# Patient Record
Sex: Female | Born: 1980 | Race: Black or African American | Hispanic: No | Smoking: Former smoker
Health system: Southern US, Community
[De-identification: ages and names within clinical notes are randomized; demographics above are authoritative.]

## PROBLEM LIST (undated history)

## (undated) ENCOUNTER — Inpatient Hospital Stay (HOSPITAL_COMMUNITY): Payer: Self-pay

## (undated) DIAGNOSIS — N390 Urinary tract infection, site not specified: Secondary | ICD-10-CM

## (undated) DIAGNOSIS — I10 Essential (primary) hypertension: Secondary | ICD-10-CM

## (undated) DIAGNOSIS — R87619 Unspecified abnormal cytological findings in specimens from cervix uteri: Secondary | ICD-10-CM

## (undated) DIAGNOSIS — K769 Liver disease, unspecified: Secondary | ICD-10-CM

## (undated) DIAGNOSIS — G4730 Sleep apnea, unspecified: Secondary | ICD-10-CM

## (undated) DIAGNOSIS — R0600 Dyspnea, unspecified: Secondary | ICD-10-CM

## (undated) DIAGNOSIS — O24419 Gestational diabetes mellitus in pregnancy, unspecified control: Secondary | ICD-10-CM

## (undated) DIAGNOSIS — D219 Benign neoplasm of connective and other soft tissue, unspecified: Secondary | ICD-10-CM

## (undated) DIAGNOSIS — IMO0002 Reserved for concepts with insufficient information to code with codable children: Secondary | ICD-10-CM

## (undated) HISTORY — PX: TUBAL LIGATION: SHX77

## (undated) HISTORY — PX: DILATION AND CURETTAGE OF UTERUS: SHX78

## (undated) HISTORY — DX: Liver disease, unspecified: K76.9

---

## 2005-07-10 ENCOUNTER — Other Ambulatory Visit: Admission: RE | Admit: 2005-07-10 | Discharge: 2005-07-10 | Payer: Self-pay | Admitting: Obstetrics and Gynecology

## 2005-08-21 ENCOUNTER — Inpatient Hospital Stay (HOSPITAL_COMMUNITY): Admission: AD | Admit: 2005-08-21 | Discharge: 2005-08-21 | Payer: Self-pay | Admitting: Obstetrics and Gynecology

## 2006-09-30 ENCOUNTER — Inpatient Hospital Stay (HOSPITAL_COMMUNITY): Admission: AD | Admit: 2006-09-30 | Discharge: 2006-09-30 | Payer: Self-pay | Admitting: Obstetrics and Gynecology

## 2006-10-02 ENCOUNTER — Encounter (INDEPENDENT_AMBULATORY_CARE_PROVIDER_SITE_OTHER): Payer: Self-pay | Admitting: Specialist

## 2006-10-02 ENCOUNTER — Ambulatory Visit (HOSPITAL_COMMUNITY): Admission: RE | Admit: 2006-10-02 | Discharge: 2006-10-02 | Payer: Self-pay | Admitting: Obstetrics & Gynecology

## 2007-05-20 ENCOUNTER — Inpatient Hospital Stay (HOSPITAL_COMMUNITY): Admission: AD | Admit: 2007-05-20 | Discharge: 2007-05-21 | Payer: Self-pay | Admitting: Obstetrics & Gynecology

## 2008-11-08 ENCOUNTER — Emergency Department (HOSPITAL_COMMUNITY): Admission: EM | Admit: 2008-11-08 | Discharge: 2008-11-08 | Payer: Self-pay | Admitting: Emergency Medicine

## 2008-12-14 ENCOUNTER — Encounter: Admission: RE | Admit: 2008-12-14 | Discharge: 2008-12-14 | Payer: Self-pay | Admitting: Orthopedic Surgery

## 2009-02-03 ENCOUNTER — Emergency Department (HOSPITAL_COMMUNITY): Admission: EM | Admit: 2009-02-03 | Discharge: 2009-02-03 | Payer: Self-pay | Admitting: Family Medicine

## 2010-04-30 ENCOUNTER — Emergency Department (HOSPITAL_COMMUNITY): Admission: EM | Admit: 2010-04-30 | Discharge: 2010-04-30 | Payer: Self-pay | Admitting: Family Medicine

## 2010-08-18 ENCOUNTER — Inpatient Hospital Stay (HOSPITAL_COMMUNITY)
Admission: AD | Admit: 2010-08-18 | Discharge: 2010-08-18 | Payer: Self-pay | Source: Home / Self Care | Attending: Obstetrics & Gynecology | Admitting: Obstetrics & Gynecology

## 2010-08-19 LAB — URINALYSIS, ROUTINE W REFLEX MICROSCOPIC
Bilirubin Urine: NEGATIVE
Ketones, ur: NEGATIVE mg/dL
Leukocytes, UA: NEGATIVE
Nitrite: NEGATIVE
Protein, ur: NEGATIVE mg/dL
Specific Gravity, Urine: >1.03 — ABNORMAL HIGH (ref 1.005–1.030)
Urine Glucose, Fasting: NEGATIVE mg/dL
Urobilinogen, UA: 0.2 mg/dL (ref 0.0–1.0)
pH: 6 (ref 5.0–8.0)

## 2010-08-19 LAB — POCT PREGNANCY, URINE: Preg Test, Ur: POSITIVE

## 2010-08-19 LAB — URINE MICROSCOPIC-ADD ON

## 2010-08-26 ENCOUNTER — Encounter: Payer: Self-pay | Admitting: Orthopedic Surgery

## 2010-08-27 ENCOUNTER — Inpatient Hospital Stay (HOSPITAL_COMMUNITY)
Admission: AD | Admit: 2010-08-27 | Discharge: 2010-08-27 | Payer: Self-pay | Source: Home / Self Care | Attending: Obstetrics and Gynecology | Admitting: Obstetrics and Gynecology

## 2010-08-28 LAB — WET PREP, GENITAL
Clue Cells Wet Prep HPF POC: NONE SEEN
Trich, Wet Prep: NONE SEEN
Yeast Wet Prep HPF POC: NONE SEEN

## 2010-08-28 LAB — HCG, QUANTITATIVE, PREGNANCY: hCG, Beta Chain, Quant, S: 16211 m[IU]/mL — ABNORMAL HIGH (ref <5)

## 2010-08-28 LAB — GC/CHLAMYDIA PROBE AMP, GENITAL
Chlamydia, DNA Probe: NEGATIVE
GC Probe Amp, Genital: NEGATIVE

## 2010-10-04 ENCOUNTER — Encounter: Payer: Managed Care, Other (non HMO) | Attending: Obstetrics and Gynecology | Admitting: Dietician

## 2010-10-04 DIAGNOSIS — R7309 Other abnormal glucose: Secondary | ICD-10-CM | POA: Insufficient documentation

## 2010-10-04 DIAGNOSIS — Z713 Dietary counseling and surveillance: Secondary | ICD-10-CM | POA: Insufficient documentation

## 2010-10-04 DIAGNOSIS — E669 Obesity, unspecified: Secondary | ICD-10-CM | POA: Insufficient documentation

## 2010-10-17 LAB — POCT RAPID STREP A (OFFICE): Streptococcus, Group A Screen (Direct): POSITIVE — AB

## 2010-10-25 ENCOUNTER — Other Ambulatory Visit (HOSPITAL_COMMUNITY): Payer: Self-pay | Admitting: Obstetrics and Gynecology

## 2010-10-25 DIAGNOSIS — Z3689 Encounter for other specified antenatal screening: Secondary | ICD-10-CM

## 2010-11-10 LAB — POCT RAPID STREP A (OFFICE): Streptococcus, Group A Screen (Direct): NEGATIVE

## 2010-11-13 ENCOUNTER — Inpatient Hospital Stay (HOSPITAL_COMMUNITY): Payer: Managed Care, Other (non HMO)

## 2010-11-13 ENCOUNTER — Encounter (HOSPITAL_COMMUNITY): Payer: Self-pay | Admitting: Radiology

## 2010-11-13 ENCOUNTER — Inpatient Hospital Stay (HOSPITAL_COMMUNITY)
Admission: AD | Admit: 2010-11-13 | Discharge: 2010-11-21 | DRG: 781 | Disposition: A | Discharge status: Home Health | Payer: Managed Care, Other (non HMO) | Source: Ambulatory Visit | Attending: Obstetrics & Gynecology | Admitting: Obstetrics & Gynecology

## 2010-11-13 DIAGNOSIS — O3410 Maternal care for benign tumor of corpus uteri, unspecified trimester: Secondary | ICD-10-CM | POA: Diagnosis present

## 2010-11-13 DIAGNOSIS — Z0489 Encounter for examination and observation for other specified reasons: Secondary | ICD-10-CM

## 2010-11-13 DIAGNOSIS — G4730 Sleep apnea, unspecified: Secondary | ICD-10-CM | POA: Diagnosis present

## 2010-11-13 DIAGNOSIS — O24919 Unspecified diabetes mellitus in pregnancy, unspecified trimester: Principal | ICD-10-CM | POA: Diagnosis present

## 2010-11-13 DIAGNOSIS — O99891 Other specified diseases and conditions complicating pregnancy: Secondary | ICD-10-CM | POA: Diagnosis present

## 2010-11-13 DIAGNOSIS — O99210 Obesity complicating pregnancy, unspecified trimester: Secondary | ICD-10-CM | POA: Diagnosis present

## 2010-11-13 DIAGNOSIS — E669 Obesity, unspecified: Secondary | ICD-10-CM | POA: Diagnosis present

## 2010-11-13 DIAGNOSIS — E119 Type 2 diabetes mellitus without complications: Secondary | ICD-10-CM | POA: Diagnosis present

## 2010-11-13 DIAGNOSIS — IMO0002 Reserved for concepts with insufficient information to code with codable children: Secondary | ICD-10-CM

## 2010-11-13 DIAGNOSIS — E1165 Type 2 diabetes mellitus with hyperglycemia: Secondary | ICD-10-CM

## 2010-11-13 DIAGNOSIS — J45909 Unspecified asthma, uncomplicated: Secondary | ICD-10-CM | POA: Diagnosis present

## 2010-11-13 DIAGNOSIS — O169 Unspecified maternal hypertension, unspecified trimester: Secondary | ICD-10-CM

## 2010-11-13 DIAGNOSIS — D259 Leiomyoma of uterus, unspecified: Secondary | ICD-10-CM | POA: Diagnosis present

## 2010-11-13 LAB — BASIC METABOLIC PANEL
BUN: 4 mg/dL — ABNORMAL LOW (ref 6–23)
CO2: 24 mEq/L (ref 19–32)
Calcium: 9.3 mg/dL (ref 8.4–10.5)
Chloride: 102 mEq/L (ref 96–112)
Creatinine, Ser: 0.64 mg/dL (ref 0.4–1.2)
GFR calc Af Amer: >60 mL/min (ref >60)
GFR calc non Af Amer: >60 mL/min (ref >60)
Glucose, Bld: 191 mg/dL — ABNORMAL HIGH (ref 70–99)
Potassium: 3.7 mEq/L (ref 3.5–5.1)
Sodium: 133 mEq/L — ABNORMAL LOW (ref 135–145)

## 2010-11-13 LAB — CBC
HCT: 35.7 % — ABNORMAL LOW (ref 36.0–46.0)
Hemoglobin: 11.3 g/dL — ABNORMAL LOW (ref 12.0–15.0)
MCH: 27.2 pg (ref 26.0–34.0)
MCHC: 31.7 g/dL (ref 30.0–36.0)
MCV: 85.8 fL (ref 78.0–100.0)
Platelets: 245 10*3/uL (ref 150–400)
RBC: 4.16 MIL/uL (ref 3.87–5.11)
RDW: 14.5 % (ref 11.5–15.5)
WBC: 9.9 10*3/uL (ref 4.0–10.5)

## 2010-11-13 LAB — GLUCOSE, CAPILLARY
Glucose-Capillary: 182 mg/dL — ABNORMAL HIGH (ref 70–99)
Glucose-Capillary: 166 mg/dL — ABNORMAL HIGH (ref 70–99)

## 2010-11-14 LAB — GLUCOSE, CAPILLARY
Glucose-Capillary: 141 mg/dL — ABNORMAL HIGH (ref 70–99)
Glucose-Capillary: 147 mg/dL — ABNORMAL HIGH (ref 70–99)
Glucose-Capillary: 155 mg/dL — ABNORMAL HIGH (ref 70–99)
Glucose-Capillary: 217 mg/dL — ABNORMAL HIGH (ref 70–99)

## 2010-11-15 ENCOUNTER — Inpatient Hospital Stay (HOSPITAL_COMMUNITY): Payer: Managed Care, Other (non HMO)

## 2010-11-15 DIAGNOSIS — J45909 Unspecified asthma, uncomplicated: Secondary | ICD-10-CM

## 2010-11-15 DIAGNOSIS — Z331 Pregnant state, incidental: Secondary | ICD-10-CM

## 2010-11-15 LAB — GLUCOSE, CAPILLARY

## 2010-11-16 DIAGNOSIS — R0602 Shortness of breath: Secondary | ICD-10-CM

## 2010-11-16 LAB — GLUCOSE, CAPILLARY
Glucose-Capillary: 151 mg/dL — ABNORMAL HIGH (ref 70–99)
Glucose-Capillary: 170 mg/dL — ABNORMAL HIGH (ref 70–99)
Glucose-Capillary: 128 mg/dL — ABNORMAL HIGH (ref 70–99)
Glucose-Capillary: 119 mg/dL — ABNORMAL HIGH (ref 70–99)

## 2010-11-17 LAB — GLUCOSE, CAPILLARY
Glucose-Capillary: 150 mg/dL — ABNORMAL HIGH (ref 70–99)
Glucose-Capillary: 152 mg/dL — ABNORMAL HIGH (ref 70–99)
Glucose-Capillary: 184 mg/dL — ABNORMAL HIGH (ref 70–99)
Glucose-Capillary: 158 mg/dL — ABNORMAL HIGH (ref 70–99)
Glucose-Capillary: 154 mg/dL — ABNORMAL HIGH (ref 70–99)

## 2010-11-18 ENCOUNTER — Encounter (HOSPITAL_COMMUNITY): Payer: Managed Care, Other (non HMO)

## 2010-11-18 DIAGNOSIS — R0602 Shortness of breath: Secondary | ICD-10-CM

## 2010-11-18 LAB — DIFFERENTIAL
Basophils Absolute: 0 10*3/uL (ref 0.0–0.1)
Basophils Relative: 0 % (ref 0–1)
Eosinophils Absolute: 0.2 10*3/uL (ref 0.0–0.7)
Monocytes Absolute: 0.6 10*3/uL (ref 0.1–1.0)
Monocytes Relative: 5 % (ref 3–12)
Neutro Abs: 8.3 10*3/uL — ABNORMAL HIGH (ref 1.7–7.7)
Neutrophils Relative %: 79 % — ABNORMAL HIGH (ref 43–77)

## 2010-11-18 LAB — TSH: TSH: 1.267 u[IU]/mL (ref 0.350–4.500)

## 2010-11-18 LAB — CBC
Hemoglobin: 11.2 g/dL — ABNORMAL LOW (ref 12.0–15.0)
MCH: 27.3 pg (ref 26.0–34.0)
MCHC: 31.6 g/dL (ref 30.0–36.0)
Platelets: 220 10*3/uL (ref 150–400)
RBC: 4.11 MIL/uL (ref 3.87–5.11)

## 2010-11-18 LAB — IGE: IgE (Immunoglobulin E), Serum: 75.7 IU/mL (ref 0.0–180.0)

## 2010-11-18 LAB — GLUCOSE, CAPILLARY
Glucose-Capillary: 146 mg/dL — ABNORMAL HIGH (ref 70–99)
Glucose-Capillary: 168 mg/dL — ABNORMAL HIGH (ref 70–99)

## 2010-11-19 LAB — GLUCOSE, CAPILLARY
Glucose-Capillary: 141 mg/dL — ABNORMAL HIGH (ref 70–99)
Glucose-Capillary: 133 mg/dL — ABNORMAL HIGH (ref 70–99)
Glucose-Capillary: 121 mg/dL — ABNORMAL HIGH (ref 70–99)
Glucose-Capillary: 143 mg/dL — ABNORMAL HIGH (ref 70–99)

## 2010-11-19 LAB — T3, FREE: T3, Free: 3 pg/mL (ref 2.3–4.2)

## 2010-11-20 ENCOUNTER — Ambulatory Visit (HOSPITAL_COMMUNITY): Admission: RE | Admit: 2010-11-20 | Payer: Managed Care, Other (non HMO) | Source: Ambulatory Visit

## 2010-11-20 ENCOUNTER — Inpatient Hospital Stay (HOSPITAL_COMMUNITY): Payer: Managed Care, Other (non HMO)

## 2010-11-20 DIAGNOSIS — O9989 Other specified diseases and conditions complicating pregnancy, childbirth and the puerperium: Secondary | ICD-10-CM

## 2010-11-20 DIAGNOSIS — O99891 Other specified diseases and conditions complicating pregnancy: Secondary | ICD-10-CM

## 2010-11-20 DIAGNOSIS — J45909 Unspecified asthma, uncomplicated: Secondary | ICD-10-CM

## 2010-11-20 DIAGNOSIS — E119 Type 2 diabetes mellitus without complications: Secondary | ICD-10-CM

## 2010-11-20 DIAGNOSIS — O24919 Unspecified diabetes mellitus in pregnancy, unspecified trimester: Secondary | ICD-10-CM

## 2010-11-20 LAB — GLUCOSE, CAPILLARY
Glucose-Capillary: 144 mg/dL — ABNORMAL HIGH (ref 70–99)
Glucose-Capillary: 151 mg/dL — ABNORMAL HIGH (ref 70–99)

## 2010-11-20 MED ORDER — IOHEXOL 300 MG/ML  SOLN: 100.0000 mL | Freq: Once | INTRAMUSCULAR | Status: DC | PRN

## 2010-11-20 MED ORDER — IOHEXOL 350 MG/ML SOLN: 100.0000 mL | Freq: Once | INTRAVENOUS | Status: AC | PRN

## 2010-11-21 LAB — COMPREHENSIVE METABOLIC PANEL
ALT: 25 U/L (ref 0–35)
AST: 24 U/L (ref 0–37)
Alkaline Phosphatase: 33 U/L — ABNORMAL LOW (ref 39–117)
CO2: 24 mEq/L (ref 19–32)
Chloride: 102 mEq/L (ref 96–112)
GFR calc Af Amer: >60 mL/min (ref >60)
GFR calc non Af Amer: >60 mL/min (ref >60)
Glucose, Bld: 126 mg/dL — ABNORMAL HIGH (ref 70–99)
Sodium: 136 mEq/L (ref 135–145)
Total Bilirubin: 0.4 mg/dL (ref 0.3–1.2)

## 2010-11-21 LAB — CBC
HCT: 35.4 % — ABNORMAL LOW (ref 36.0–46.0)
Hemoglobin: 11.1 g/dL — ABNORMAL LOW (ref 12.0–15.0)
MCHC: 31.4 g/dL (ref 30.0–36.0)
RBC: 4.11 MIL/uL (ref 3.87–5.11)

## 2010-11-21 LAB — HEMOGLOBIN A1C: Hgb A1c MFr Bld: 7.7 % — ABNORMAL HIGH (ref <5.7)

## 2010-11-21 LAB — GLUCOSE, CAPILLARY: Glucose-Capillary: 126 mg/dL — ABNORMAL HIGH (ref 70–99)

## 2010-11-22 LAB — CREATININE, URINE, 24 HOUR
Collection Interval-UCRE24: 24 hours
Creatinine, Urine: 125.9 mg/dL
Urine Total Volume-UCRE24: 2300 mL

## 2010-11-22 LAB — PROTEIN, URINE, 24 HOUR
Collection Interval-UPROT: 24 hours
Protein, 24H Urine: 276 mg/d — ABNORMAL HIGH (ref 50–100)
Protein, Urine: 12 mg/dL

## 2010-11-25 NOTE — Discharge Summary (Signed)
[name], [name]               ACCOUNT NO.:  [id number]  MEDICAL RECORD NO.:  [id number]           PATIENT TYPE:  I  LOCATION:  9309                          FACILITY:  [facility]  PHYSICIAN:  Horton Chin, MD DATE OF BIRTH:  [birth year]  DATE OF ADMISSION:  11/13/2010 DATE OF DISCHARGE:  11/21/2010                              DISCHARGE SUMMARY   ADMISSION DIAGNOSES: 1. Intrauterine pregnancy at 18 weeks. 2. Morbid obesity with a BMI of 67. 3. Poorly-controlled type 2 diabetes. 4. Shortness of breath of unknown etiology.  DISCHARGE DIAGNOSES: 1. Intrauterine pregnancy at 8 and 1/7th weeks gestation. 2. Improved control of type 2 diabetes. 3. Likely obstructive sleep apnea. 4. Reactive airway disease.  CONSULTATIONS: 1. Particia Nearing, MD, Maternal Fetal Medicine. 2. Charlcie Cradle. Delford Field, MD, FCCP, [facility]. 3. Diabetic education by Lenor Coffin, RN, CDE, diabetics coordinator. 4. Nutritionist consultation. 5. Care management consultation to help with home health followup.  PERTINENT STUDIES: 1. Obstetric ultrasound on November 15, 2010 showed an estimated fetal     weight of 239 grams in the 50th percentile, normal amniotic fluid,     normal basic anatomy, cervical length 3.8 cm, and left fundal myoma     measuring 7.7 x 5.7 x 5.4 cm. 2. Echocardiogram with an estimated ejection fraction of 60-65%, mild     left ventricular hypertrophy, grade 1 diastolic dysfunction, mildly     dilated left atrium. 3. Bilateral lower extremity Dopplers which were negative for DVT. 4. Chest x-ray:  No acute cardiopulmonary disease. 5. CT scan of the chest which did not show any large kind of embolism,     but the small peripheral branches could not be fully assessed     secondary to the suboptimal images due to the patient's habitus. 6. The patient did have an hemoglobin A1c of 7.7.  She had a     TSH of 1.614.  She had normal comprehensive metabolic  panel and a     normal complete blood count.  At the time of discharge, the results     of her 24-hour urine were pending.  The patient did have a D-dimer     that was 0.34.  HOSPITAL COURSE:  The patient is a 30 year old gravida 2, para 0-0-1-0 who was admitted at 18 weeks with shortness of breath and poorly controlled type 2 diabetes.  The patient was initially managed by the [facility].  Upon review of the chart, she was noted to have shortness of breath that was concerning and was evaluated by Pulmonary Team that felt that this was likely an obstructive sleep apnea with reactive airway disease.  Also, there was concern about possible pulmonary embolus but a CT scan that was done did not show evidence of pulmonary embolus but this was a suboptimal study given her habitus.  The patient was started on medications for her reactive airway disease and also the plan was for her to follow up outpatient for a sleep study for her shortness of breath.  Her shortness of breath did persist throughout  her stay and she required oxygen, especially during any form of exertion.  Plans were made for home health nurse and home health care and allow for oxygen to be used at home for the patient.  The patient had no evidence of any acute cardiopulmonary compromise as she did have a normal echocardiogram and other studies. Of note, these studies were also noted to be suboptimal secondary to the patient's habitus. Of note, the patient was started on prophylactic  Lovenox for DVT and PE prophylaxis while she was in-house.  As for her type 2 diabetes, the patient was seen by Lenor Coffin, clinical diabetic educator and also Dr. Particia Nearing from Maternal and Fetal Care Medicine.  The patient was started on weight-based insulin regimen and sliding scale was also added for better correction of her CBGs.  At the time of discharge, the patient's regimen was NPH 40 in the morning, 55 at bed  time, and also regular 45 units before each meal.  She also had a correction scale for all her blood sugars.  The patient was also seen by nutritionist and given instructions on how to follow a high carbohydrate modified diet for improved glycemic control.  As part of workup for pregestational diabetes, a 24-hour urine collection was started and the results were pending at the time of this dictation.  She did have a TSH which was done which was within normal limits and a hemoglobin A1c that was 7.7.  The patient has been educated about the risks of uncontrolled diabetes, and increased maternal-fetal morbidity and mortality that could result as a result from poorly controlled diabetes.  By the time of discharge, the patient's glycemic control was improved,  her shortness of breath was stable, and she was deemed stable for  discharge to home with home health care.  DISCHARGE MEDICATIONS:  The patient does have written instructions for the home health care and for her home oxygen.  1. Lovenox 100 mg subcu q.24 h. 2.  NPH insulin 40 units in the morning and 55 in the p.m. and also regular 45 units before each meal within a regular insulin correction scale. 1. Symbicort 160/4.5 mcg two puffs inhaled b.i.d. 2. Combivent 2 puffs inhaled q.i.d. p.r.n. wheezing. 3. Metformin 1000 mg p.o. b.i.d. 4. Folic acid 0.4 mg daily. 5. Zyrtec 1 tablet daily. 6. Prenatal vitamins as needed daily.  DISCHARGE INSTRUCTIONS:  The patient has the following discharge appointments.  She will be followed [facility] under that guidance of Maternal and Fetal Medicine Team at [facility] and information was faxed over to them.  They will be calling her with an appointment but she also has an appointment with Dr. Sherrie George here at [facility] on Dec 17, 2010 at 10:00 a.m. for followup.  This appointment could be changed if she does get a clinic appointment at [facility]  before then, because she might be able to do these studies there at [facility].  She also had an appointment with Dr. Stann Mainland at [facility] for evaluation for a sleep study and this is on Dec 10, 2010 at 2:30 p.m.  The patient will be seen by [facility], the number is [phone].  The patient was given routine antenatal discharge instructions.  She was also told to follow up in the emergency room for any concerns and to follow up with all her appointments and to expect a call from the [facility] at [facility] regarding when she  will come in for a clinic appointment.     Horton Chin, MD     UAA/MEDQ  D:  11/21/2010  T:  11/22/2010  Job:  [id number]  Electronically Signed by Jaynie Collins MD on 11/25/2010 12:28:49 PM

## 2010-11-26 ENCOUNTER — Inpatient Hospital Stay (HOSPITAL_COMMUNITY)
Admission: AD | Admit: 2010-11-26 | Discharge: 2010-11-26 | Disposition: A | Discharge status: Home / Self Care | Payer: Managed Care, Other (non HMO) | Source: Ambulatory Visit | Attending: Obstetrics & Gynecology | Admitting: Obstetrics & Gynecology

## 2010-11-26 DIAGNOSIS — O99891 Other specified diseases and conditions complicating pregnancy: Secondary | ICD-10-CM | POA: Insufficient documentation

## 2010-11-26 DIAGNOSIS — N949 Unspecified condition associated with female genital organs and menstrual cycle: Secondary | ICD-10-CM

## 2010-11-26 DIAGNOSIS — O9989 Other specified diseases and conditions complicating pregnancy, childbirth and the puerperium: Secondary | ICD-10-CM

## 2010-11-26 LAB — URINALYSIS, ROUTINE W REFLEX MICROSCOPIC
Bilirubin Urine: NEGATIVE
Glucose, UA: NEGATIVE mg/dL
Protein, ur: NEGATIVE mg/dL

## 2010-11-27 ENCOUNTER — Ambulatory Visit: Payer: Self-pay | Admitting: Dietician

## 2010-12-06 ENCOUNTER — Encounter: Payer: Self-pay | Admitting: Pulmonary Disease

## 2010-12-10 ENCOUNTER — Encounter: Payer: Self-pay | Admitting: Pulmonary Disease

## 2010-12-10 ENCOUNTER — Ambulatory Visit (INDEPENDENT_AMBULATORY_CARE_PROVIDER_SITE_OTHER): Payer: Managed Care, Other (non HMO) | Admitting: Pulmonary Disease

## 2010-12-10 VITALS — BP 130/80 | HR 132 | Temp 98.4°F | Ht 66.0 in | Wt >= 6400 oz

## 2010-12-10 DIAGNOSIS — G4733 Obstructive sleep apnea (adult) (pediatric): Secondary | ICD-10-CM

## 2010-12-10 NOTE — Patient Instructions (Signed)
Will set up for a sleep study, and call you to arrange followup Work on weight loss

## 2010-12-10 NOTE — Progress Notes (Signed)
Subjective:    Patient ID: [name], female    DOB: 02/19/1981, 30 y.o.   MRN: [id number]  HPI The pt is a morbidly obese 29y/o female who is referred for evaluation of possible sleep apnea.  She is [redacted] weeks pregnant, but also has a BMI of 68.  She was recently in the hospital for sob and uncontrolled DM, and had no central PE by ct, no dvt by LE dopplers, and echo showed normal EF with diastolic dysfunction and mild LA dilatation.  She was discharged home on supplemental oxygen at 2lpm, but pt is noncompliant with this at HS.  The question was raised whether she may have osa?  She denies having a snoring issue or abnormal breathing pattern, but has been told she has "heavy breathing".  She feels she is rested in the am's upon arising, but does admit to sleep pressure at work when she is inactive.  She denies dozing in the evening with tv, and has no sleepiness with driving.  Her weight is up 50 pounds from one year ago per her history, and her epworth today is very abnormal at 15.    Sleep Questionnaire:   What time do you typically go to bed?( Between what hours)  9 to 10 pm         How long does it take you to fall asleep?  seconds         How many times during the night do you wake up?  3         What time do you get out of bed to start your day?  0630         Do you drive or operate heavy machinery in your occupation?  No         How much has your weight changed (up or down) over the past two years? (In pounds)           Have you ever had a sleep study before?   No         If yes, location of study?           If yes, date of study?           Do you currently use CPAP?  No         Do you wear oxygen at any time?  Yes         O2 Flow Rate (L/min)  2 L/min              Review of Systems  Constitutional: Positive for unexpected weight change. Negative for fever.  HENT: Positive for dental problem. Negative for ear pain, nosebleeds, congestion, sore throat, rhinorrhea, sneezing,  trouble swallowing, postnasal drip and sinus pressure.   Eyes: Negative for redness and itching.  Respiratory: Positive for shortness of breath. Negative for cough, chest tightness and wheezing.   Cardiovascular: Positive for palpitations. Negative for leg swelling.  Gastrointestinal: Positive for abdominal pain. Negative for nausea, vomiting and abdominal distention.  Genitourinary: Negative for dysuria.  Musculoskeletal: Negative for joint swelling.  Skin: Negative for rash.  Neurological: Positive for headaches.  Hematological: Does not bruise/bleed easily.  Psychiatric/Behavioral: Negative for dysphoric mood. The patient is not nervous/anxious.        Objective:   Physical Exam Constitutional:  Morbidly obese, no acute distress  HENT:  Nares patent without discharge, enlarged turbinates  Oropharynx without exudate, palate and uvula large with soft tissue obstruction posteriorly  Eyes:  Perrla,  eomi, no scleral icterus  Neck:  No JVD, no TMG  Cardiovascular:  Mildly tachy, regular rhythm, no rubs or gallops.  2/6 sem        Intact distal pulses  Pulmonary :  decreased breath sounds, no stridor or respiratory distress   No rales, rhonchi, or wheezing  Abdominal:  Soft, nondistended, bowel sounds present.  No tenderness noted.   Musculoskeletal:  2+ lower extremity edema noted.  Lymph Nodes:  No cervical lymphadenopathy noted  Skin:  No cyanosis noted  Neurologic:  Alert, appropriate, moves all 4 extremities without obvious deficit.         Assessment & Plan:

## 2010-12-15 ENCOUNTER — Encounter: Payer: Self-pay | Admitting: Pulmonary Disease

## 2010-12-15 NOTE — Assessment & Plan Note (Signed)
The pt's history is suggestive of OSA, and it is likely she has OHS as well given her chronic hypoxemia.  I feel very strongly she needs a sleep study, given that we may not be able to keep her and her baby adequately oxygenated at night while sleeping on oxygen alone given her massive obesity.  She is not wanting to have a sleep study, but I have stressed the importance and she has relented.  This is a very high risk situation, and the pt is followed by high risk OB at [facility].

## 2010-12-17 ENCOUNTER — Ambulatory Visit (HOSPITAL_COMMUNITY): Payer: Managed Care, Other (non HMO)

## 2010-12-17 ENCOUNTER — Other Ambulatory Visit (HOSPITAL_COMMUNITY): Payer: Managed Care, Other (non HMO)

## 2010-12-20 ENCOUNTER — Ambulatory Visit (HOSPITAL_COMMUNITY): Payer: Managed Care, Other (non HMO)

## 2010-12-20 NOTE — Op Note (Signed)
[name], [name]               ACCOUNT NO.:  [id number]   MEDICAL RECORD NO.:  [id number]          PATIENT TYPE:  AMB   LOCATION:  SDC                           FACILITY:  [facility]   PHYSICIAN:  Gerrit Friends. Aldona Bar, M.D.   DATE OF BIRTH:  1980-10-04   DATE OF PROCEDURE:  10/02/2006  DATE OF DISCHARGE:                               OPERATIVE REPORT   PREOPERATIVE DIAGNOSES:  1. First trimester pregnancy loss.  2. Blood type A positive.  3. Morbid obesity.   POSTOPERATIVE DIAGNOSES:  1. First trimester pregnancy loss.  2. Blood type A positive.  3. Morbid obesity.  4. Pathology pending.   PROCEDURE:  Suction, dilatation and curettage for evacuation of first  trimester pregnancy loss.   SURGEON:  Gerrit Friends. Aldona Bar, M.D.   ANESTHESIA:  General -- failed MAC.   HISTORY:  This 30 year old gravida 1, para 0 was seen by me in the  office around February 13 and at that time on ultrasound, had a 6-week 5-  day intrauterine pregnancy with good fetal heart.  Other than morbid  obesity, no problems were identified.  She returned to the office for  followup as directed on February 26 and at that time, had an 8-week 2-  day intrauterine pregnancy, but no fetal heart was seen.  Confirmatory  ultrasound was done in the office on a more sensitive machine and  confirmed again fetal loss.  Situation was discussed with the patient,  but the patient elected to defer further treatment for several days.  She self-presented to Maternity Admissions Unit on February 27 for  reevaluation and at that time, a third ultrasound also confirmed first  trimester pregnancy loss.  She is now being taken to the operating room  for termination of a first trimester pregnancy loss.   PROCEDURE:  The patient was taken to the operating room, where after the  satisfactory attempt at intravenous conscious sedation was unsuccessful.  Thereafter, the patient underwent general anesthesia.  She was prepped  and draped, having  been placed in the modified lithotomy position in the  short Allen stirrups.  Her bladder was drained of clear urine with a  latex-free catheter.  At this time, a speculum was placed in the vagina  and a single-tooth tenaculum placed on the anterior lip of cervix and  the internal os dilated progressively to #25 Pratt dilator without  difficulty.  Thereafter, using the #8 suction curette, the cavity was  thoroughly, gently and systematically evacuated of all products of  conception, which were sent subsequently to Pathology.  Curettage of the  cavity found it to be smooth and no remaining products of conception  were noted.  Re-suctioning confirmed an empty uterus.  The patient at  this time, after all instruments were removed, was awakened and  transported to the recovery room in satisfactory, having tolerated the  procedure well.  Estimated blood loss was negligible.  All counts were  correct x2.  Pathologic specimen consisted of products of conception.   The patient will be observed in the recovery area and discharged to  home.  Discharge medications will include Anaprox DS one every 6-8 hours  as needed for cramping and doxycycline 100 mg twice daily for a total of  5 days.  Appropriate instructions will be given to the patient and  followup hopefully will be arranged by the patient in about 2 weeks'  time or as needed.   CONDITION ON ARRIVAL RECOVERY ROOM:  Satisfactory.      Gerrit Friends. Aldona Bar, M.D.  Electronically Signed     RMW/MEDQ  D:  10/02/2006  T:  10/02/2006  Job:  [id number]

## 2011-01-08 ENCOUNTER — Ambulatory Visit (HOSPITAL_BASED_OUTPATIENT_CLINIC_OR_DEPARTMENT_OTHER): Payer: Managed Care, Other (non HMO) | Attending: Pulmonary Disease

## 2011-01-08 DIAGNOSIS — G4733 Obstructive sleep apnea (adult) (pediatric): Secondary | ICD-10-CM

## 2011-01-08 DIAGNOSIS — I491 Atrial premature depolarization: Secondary | ICD-10-CM | POA: Insufficient documentation

## 2011-01-08 DIAGNOSIS — I498 Other specified cardiac arrhythmias: Secondary | ICD-10-CM | POA: Insufficient documentation

## 2011-01-21 DIAGNOSIS — G4733 Obstructive sleep apnea (adult) (pediatric): Secondary | ICD-10-CM

## 2011-01-21 DIAGNOSIS — I498 Other specified cardiac arrhythmias: Secondary | ICD-10-CM

## 2011-01-21 DIAGNOSIS — I491 Atrial premature depolarization: Secondary | ICD-10-CM

## 2011-01-21 NOTE — Procedures (Signed)
[name], [name]               ACCOUNT NO.:  [id number]  MEDICAL RECORD NO.:  [id number]          PATIENT TYPE:  OUT  LOCATION:  SLEEP CENTER                 FACILITY[facility]  [facility]  PHYSICIAN:  Barbaraann Share, MD,FCCPDATE OF BIRTH:  1982/05/31  DATE OF STUDY:  01/08/2011                           NOCTURNAL POLYSOMNOGRAM  REFERRING PHYSICIAN:  Barbaraann Share, MD,FCCP  INDICATION FOR STUDY:  Hypersomnia with sleep apnea.  EPWORTH SCORE:  Nine.  SLEEP ARCHITECTURE:  The patient had a total sleep time of 276 minutes with no slow wave sleep and only 28 minutes of REM.  Sleep onset latency was normal at 6 minutes, and REM onset was normal at 107 minutes.  Sleep efficiency was moderately reduced at 73%.  RESPIRATORY DATA:  The patient was found to have 1 obstructive apnea and 174 obstructive hypopneas, for an apnea/hypopnea index of 38 events per hour.  The events occurred in all body positions, and there was moderate snoring noted throughout.  OXYGEN DATA:  There was O2 desaturation as low as 74% with the patient's obstructive events.  CARDIAC DATA:  The patient was noted to have sinus tachycardia throughout the study with an occasional PAC.  MOVEMENT/PARASOMNIA:  The patient had no significant leg jerks or other abnormal behaviors.  IMPRESSION/RECOMMENDATIONS: 1. Severe obstructive sleep apnea/hypopnea syndrome with an AHI of 38     events per hour and O2 desaturation is low at 74%.  Treatment for     this degree of sleep apnea should include CPAP as well as     aggressive weight loss. 2. The patient was noted to have sinus tachycardia throughout the     night with an occasional PAC.     Barbaraann Share, MD,FCCP Diplomate, American Board of Sleep Medicine Electronically Signed    KMC/MEDQ  D:  01/21/2011 08:49:36  T:  01/21/2011 22:37:08  Job:  [id number]

## 2011-01-24 ENCOUNTER — Ambulatory Visit: Payer: Managed Care, Other (non HMO) | Admitting: Pulmonary Disease

## 2011-02-07 ENCOUNTER — Encounter: Payer: Self-pay | Admitting: Pulmonary Disease

## 2011-02-12 ENCOUNTER — Ambulatory Visit (INDEPENDENT_AMBULATORY_CARE_PROVIDER_SITE_OTHER): Payer: Managed Care, Other (non HMO) | Admitting: Pulmonary Disease

## 2011-02-12 ENCOUNTER — Encounter: Payer: Self-pay | Admitting: Pulmonary Disease

## 2011-02-12 VITALS — BP 126/80 | HR 139 | Temp 98.4°F | Ht 66.0 in | Wt >= 6400 oz

## 2011-02-12 DIAGNOSIS — G4733 Obstructive sleep apnea (adult) (pediatric): Secondary | ICD-10-CM

## 2011-02-12 NOTE — Progress Notes (Signed)
  Subjective:    Patient ID: [name], female    DOB: 09-Apr-1981, 30 y.o.   MRN: [id number]  HPI The pt comes in today for f/u of her recent sleep study.  She was found to have severe osa with AHI 38/hr and desats as low as 74%.  I have reviewed the study with in detail, and answered all of her questions.    Review of Systems  Constitutional: Negative for fever and unexpected weight change.  HENT: Negative for ear pain, nosebleeds, congestion, sore throat, rhinorrhea, sneezing, trouble swallowing, dental problem, postnasal drip and sinus pressure.   Eyes: Negative for redness and itching.  Respiratory: Positive for cough and shortness of breath. Negative for chest tightness and wheezing.   Cardiovascular: Positive for leg swelling. Negative for palpitations.  Gastrointestinal: Positive for nausea and vomiting.  Genitourinary: Negative for dysuria.  Musculoskeletal: Negative for joint swelling.  Skin: Positive for rash.  Neurological: Negative for headaches.  Hematological: Does not bruise/bleed easily.  Psychiatric/Behavioral: Negative for dysphoric mood. The patient is not nervous/anxious.        Objective:   Physical Exam Morbidly obese female, nad Nares without obvious purulence or discharge LE with mild edema, no cyanosis noted Alert, oriented, moves all 4.        Assessment & Plan:

## 2011-02-12 NOTE — Patient Instructions (Signed)
Will start on cpap for your severe sleep apnea.  Please call if having tolerance issues followup with me in 5 weeks to check on your progress.

## 2011-02-13 ENCOUNTER — Telehealth: Payer: Self-pay | Admitting: Pulmonary Disease

## 2011-02-13 NOTE — Telephone Encounter (Signed)
Okey Regal with [facility] called and wanted to check on cpap order. The order didn't state to have have o2 bled into cpap. Please advise if you would like [place] to re-fax the order with directions to bleed o2 into cpap.

## 2011-02-13 NOTE — Telephone Encounter (Signed)
Returned Carol's call and LMOAM for her to return my call.

## 2011-02-13 NOTE — Telephone Encounter (Signed)
Will forward to [facility] for this

## 2011-02-14 NOTE — Telephone Encounter (Signed)
I didn't order her oxygen, and if she is to be on cpap and oxygen it obviously needs to be bled into each other.  Should not need another order for this.  She is on both and should stay on both.

## 2011-02-14 NOTE — Telephone Encounter (Signed)
Called and spoke with Okey Regal and relayed Dr. Shelle Iron message that if patient was on o2 at night at 2 lpm then obviously o2 at 2 lpm needed to be bled into CPAP. This was written on previous order and re-faxed to Nelson at [place].

## 2011-02-17 NOTE — Assessment & Plan Note (Signed)
The pt has severe osa, and will need to be started on cpap.  I have stressed to her how important this is for her and her baby.  She is agreeable.  I will set the patient up on cpap at a moderate pressure level to allow for desensitization, and will troubleshoot the device over the next 4-6weeks if needed.  The pt is to call me if having issues with tolerance.  Will then optimize the pressure once patient is able to wear cpap on a consistent basis.

## 2011-03-19 ENCOUNTER — Ambulatory Visit: Payer: Managed Care, Other (non HMO) | Admitting: Pulmonary Disease

## 2011-04-04 ENCOUNTER — Telehealth: Payer: Self-pay | Admitting: Pulmonary Disease

## 2011-04-04 NOTE — Telephone Encounter (Signed)
Correct # for [name] with [facility] is [phone].  Called and spoke with [name] and informed her patient came to [place] already on oxygen.  We never started her on it.  Jessica vebalized understanding and stated she didn't need an order to d/c o2.  She stated pt was started on o2 while in hospital and pt is going to sign AMA to d/c portable o2 but still keep concentrator.  [name] just wanted this message forwarded to [facility] as an FYI.

## 2011-04-04 NOTE — Telephone Encounter (Signed)
[name], the phone number for Shanda Bumps at [facility] is incorrect.  This is the phone # to [facility] - our patient care coordinator.

## 2011-04-08 NOTE — Telephone Encounter (Signed)
Noted.  That is her decision.  She is followed by high risk OB at [facility], who ordered the oxygen in the first place

## 2011-05-14 LAB — HERPES SIMPLEX VIRUS CULTURE: Culture: NOT DETECTED

## 2011-05-14 LAB — MRSA CULTURE

## 2011-08-24 ENCOUNTER — Encounter (HOSPITAL_COMMUNITY): Payer: Self-pay | Admitting: *Deleted

## 2011-08-24 ENCOUNTER — Emergency Department (INDEPENDENT_AMBULATORY_CARE_PROVIDER_SITE_OTHER)
Admission: EM | Admit: 2011-08-24 | Discharge: 2011-08-24 | Disposition: A | Discharge status: Home / Self Care | Payer: Managed Care, Other (non HMO) | Source: Home / Self Care

## 2011-08-24 DIAGNOSIS — J069 Acute upper respiratory infection, unspecified: Secondary | ICD-10-CM

## 2011-08-24 HISTORY — DX: Morbid (severe) obesity due to excess calories: E66.01

## 2011-08-24 HISTORY — DX: Gestational diabetes mellitus in pregnancy, unspecified control: O24.419

## 2011-08-24 MED ORDER — LIDOCAINE VISCOUS 2 % MT SOLN: 20.0000 mL | OROMUCOSAL | Status: AC | PRN

## 2011-08-24 NOTE — ED Provider Notes (Signed)
History     CSN: [id number]  Arrival date & time 08/24/11  0901   None     Chief Complaint  Patient presents with  . Sore Throat    (Consider location/radiation/quality/duration/timing/severity/associated sxs/prior treatment) HPI Comments: Pt was here at Urgent Care 2 days ago for same sx in infant son, became sick later that day.  Pt also concerned she may be pregnant as period is late.   Patient is a 31 y.o. female presenting with pharyngitis. The history is provided by the patient.  Sore Throat This is a new problem. The current episode started 2 days ago. The problem occurs constantly. The problem has been gradually worsening. Associated symptoms include headaches. Pertinent negatives include no chest pain, no abdominal pain and no shortness of breath. The symptoms are aggravated by swallowing. The symptoms are relieved by nothing. Treatments tried: salt water gargles and sudafed. The treatment provided mild relief.    Past Medical History  Diagnosis Date  . Gestational diabetes   . Morbid obesity     Past Surgical History  Procedure Date  . Dilation and curettage of uterus   . Cesarean section     Family History  Problem Relation Age of Onset  . Allergies Mother   . Asthma Mother     History  Substance Use Topics  . Smoking status: Former Games developer  . Smokeless tobacco: Not on file  . Alcohol Use: No    OB History    Grav Para Term Preterm Abortions TAB SAB Ect Mult Living   1               Review of Systems  Constitutional: Negative for fever and chills.  HENT: Positive for congestion, sore throat, postnasal drip and sinus pressure. Negative for ear pain.   Respiratory: Positive for cough. Negative for shortness of breath and wheezing.   Cardiovascular: Negative for chest pain.  Gastrointestinal: Negative for abdominal pain.  Neurological: Positive for headaches.    Allergies  Other and Penicillins  Home Medications   Current Outpatient Rx  Name  Route Sig Dispense Refill  . IPRATROPIUM-ALBUTEROL 18-103 MCG/ACT IN AERO Inhalation Inhale 2 puffs into the lungs every 6 (six) hours as needed.      . BUDESONIDE-FORMOTEROL FUMARATE 160-4.5 MCG/ACT IN AERO Inhalation Inhale 2 puffs into the lungs 2 (two) times daily.      [name] ENOXAPARIN SODIUM 150 MG/ML [place] SOLN Subcutaneous Inject into the skin daily.      [name] FOLIC ACID 400 MCG PO TABS Oral Take 400 mcg by mouth daily.      [name] NOVOLIN N [place]  as directed.      [name] NOVOLIN R IJ Injection Inject as directed as directed.      [name] METFORMIN HCL 1000 MG PO TABS Oral Take 1,000 mg by mouth 2 (two) times daily with a meal.      . SE-NATAL 19 PO Oral Take 1 tablet by mouth daily.        BP 175/77  Pulse 96  Temp(Src) 98.7 F (37.1 C) (Oral)  Resp 22  SpO2 97%  LMP 07/14/2011  Breastfeeding? Unknown  Physical Exam  Constitutional: She appears well-developed and well-nourished. No distress.       Morbidly obese  HENT:  Head: Normocephalic.  Right Ear: Tympanic membrane, external ear and ear canal normal.  Left Ear: Tympanic membrane, external ear and ear canal normal.  Nose: Mucosal edema present. Right sinus exhibits maxillary sinus tenderness and frontal  sinus tenderness. Left sinus exhibits maxillary sinus tenderness and frontal sinus tenderness.  Mouth/Throat: Uvula swelling present. Posterior oropharyngeal edema present. No tonsillar abscesses.       No erythema to throat, mild swelling of tonsils and uvula  Cardiovascular: Normal rate and regular rhythm.   Pulmonary/Chest: Effort normal and breath sounds normal.  Lymphadenopathy:       Head (right side): Submandibular adenopathy present.       Head (left side): Submandibular adenopathy present.    She has no cervical adenopathy.    ED Course  Procedures (including critical care time)  Pt to f/u with pcp re: elevated bp (pt states was 140/90 earlier this week when she gave blood).   Labs Reviewed  POCT RAPID STREP A ([facility] ONLY)    POCT PREGNANCY, URINE  POCT PREGNANCY, URINE   No results found.   No diagnosis found.    MDM          Cathlyn Parsons, NP 08/24/11 1000

## 2011-08-24 NOTE — ED Notes (Signed)
Last night started w/ swollen tonsils and sore throat without fevers.  Started w/ nasal congestion yesterday.  Has taken Mucinex, a decongestant, and doing warm salt water gargles.

## 2011-08-26 NOTE — ED Provider Notes (Signed)
Medical screening examination/treatment/procedure(s) were performed by non-physician practitioner and as supervising physician I was immediately available for consultation/collaboration.   Barkley Bruns MD.    Barkley Bruns, MD 08/26/11 [phone]

## 2011-09-19 ENCOUNTER — Encounter (HOSPITAL_COMMUNITY): Payer: Self-pay | Admitting: *Deleted

## 2011-09-19 ENCOUNTER — Inpatient Hospital Stay (HOSPITAL_COMMUNITY): Payer: Managed Care, Other (non HMO)

## 2011-09-19 ENCOUNTER — Inpatient Hospital Stay (HOSPITAL_COMMUNITY)
Admission: AD | Admit: 2011-09-19 | Discharge: 2011-09-20 | Disposition: A | Discharge status: Home / Self Care | Payer: Managed Care, Other (non HMO) | Source: Ambulatory Visit | Attending: Obstetrics & Gynecology | Admitting: Obstetrics & Gynecology

## 2011-09-19 DIAGNOSIS — O30001 Twin pregnancy, unspecified number of placenta and unspecified number of amniotic sacs, first trimester: Secondary | ICD-10-CM

## 2011-09-19 DIAGNOSIS — O26899 Other specified pregnancy related conditions, unspecified trimester: Secondary | ICD-10-CM

## 2011-09-19 DIAGNOSIS — O99891 Other specified diseases and conditions complicating pregnancy: Secondary | ICD-10-CM | POA: Insufficient documentation

## 2011-09-19 DIAGNOSIS — R109 Unspecified abdominal pain: Secondary | ICD-10-CM | POA: Insufficient documentation

## 2011-09-19 DIAGNOSIS — J45909 Unspecified asthma, uncomplicated: Secondary | ICD-10-CM | POA: Insufficient documentation

## 2011-09-19 DIAGNOSIS — O30009 Twin pregnancy, unspecified number of placenta and unspecified number of amniotic sacs, unspecified trimester: Secondary | ICD-10-CM | POA: Insufficient documentation

## 2011-09-19 DIAGNOSIS — O3410 Maternal care for benign tumor of corpus uteri, unspecified trimester: Secondary | ICD-10-CM | POA: Insufficient documentation

## 2011-09-19 DIAGNOSIS — O9989 Other specified diseases and conditions complicating pregnancy, childbirth and the puerperium: Secondary | ICD-10-CM

## 2011-09-19 DIAGNOSIS — D259 Leiomyoma of uterus, unspecified: Secondary | ICD-10-CM

## 2011-09-19 HISTORY — DX: Sleep apnea, unspecified: G47.30

## 2011-09-19 LAB — WET PREP, GENITAL
Trich, Wet Prep: NONE SEEN
Yeast Wet Prep HPF POC: NONE SEEN

## 2011-09-19 LAB — URINALYSIS, ROUTINE W REFLEX MICROSCOPIC
Bilirubin Urine: NEGATIVE
Hgb urine dipstick: NEGATIVE
Specific Gravity, Urine: 1.02 (ref 1.005–1.030)
pH: 6.5 (ref 5.0–8.0)

## 2011-09-19 LAB — POCT PREGNANCY, URINE: Preg Test, Ur: POSITIVE — AB

## 2011-09-19 LAB — ABO/RH: ABO/RH(D): A POS

## 2011-09-19 NOTE — Progress Notes (Signed)
Lower abd cramping for 2 days. Denies bleeding or vag d/c. Had baby in August. Periods were regular initially but then irreg. LMP first of December

## 2011-09-19 NOTE — Progress Notes (Signed)
Magda Kiel CNM in to see pt. Explained after exam will get blood work and u/s. Pt agrees.

## 2011-09-19 NOTE — ED Provider Notes (Signed)
Chief Complaint:  Abdominal Pain   [name] is  31 y.o. G3P1011.  Patient's last menstrual period was 07/14/2011.[name]  Her pregnancy status is positive.  She presents complaining of Abdominal Pain  Reports period-like cramping x 2 days. Denies vaginal bleeding, discharge, dysuria, or back pain.  Obstetrical/Gynecological History: OB History    Grav Para Term Preterm Abortions TAB SAB Ect Mult Living   3 1 1  0 1 0 1 0 0 1      Past Medical History: Past Medical History  Diagnosis Date  . Gestational diabetes   . Morbid obesity   . Asthma   . Sleep apnea     Past Surgical History: Past Surgical History  Procedure Date  . Dilation and curettage of uterus   . Cesarean section     Family History: Family History  Problem Relation Age of Onset  . Allergies Mother   . Asthma Mother   . Anesthesia problems Neg Hx   . Hypotension Neg Hx   . Malignant hyperthermia Neg Hx   . Pseudochol deficiency Neg Hx     Social History: History  Substance Use Topics  . Smoking status: Former Games developer  . Smokeless tobacco: Not on file  . Alcohol Use: No    Allergies:  Allergies  Allergen Reactions  . Other     Fish  . Penicillins Rash    Prescriptions prior to admission  Medication Sig Dispense Refill  . acetaminophen (TYLENOL) 325 MG tablet Take 650 mg by mouth every 6 (six) hours as needed. As needed for pain      . Prenatal Vit-Fe Fumarate-FA (PRENATAL MULTIVITAMIN) TABS Take 1 tablet by mouth daily.        Review of Systems - Negative except what has been reviewed in HPI  Physical Exam   Blood pressure 141/53, pulse 86, temperature 97.9 F (36.6 C), temperature source Oral, resp. rate 20, height 5' 6.5" (1.689 m), weight 397 lb 9.6 oz (180.35 kg), last menstrual period 07/14/2011, unknown if currently breastfeeding.  General: General appearance - alert, well appearing, and in no distress, oriented to person, place, and time and morbidly obese Mental status -  alert, oriented to person, place, and time, normal mood, behavior, speech, dress, motor activity, and thought processes, affect appropriate to mood Abdomen - soft, nontender, nondistended, no masses or organomegaly obese Focused Gynecological Exam: VULVA: normal appearing vulva with no masses, tenderness or lesions, VAGINA: normal appearing vagina with normal color and discharge, no lesions, CERVIX: normal appearing cervix without discharge or lesions, UTERUS: unable to appreciate secondary to maternal habitus, ADNEXA:  unable to appreciate secondary to maternal habitus  Labs: Recent Results (from the past 24 hour(s))  URINALYSIS, ROUTINE W REFLEX MICROSCOPIC   Collection Time   09/19/11  8:55 PM      Component Value Range   Color, Urine YELLOW  YELLOW    APPearance CLEAR  CLEAR    Specific Gravity, Urine 1.020  1.005 - 1.030    pH 6.5  5.0 - 8.0    Glucose, UA NEGATIVE  NEGATIVE (mg/dL)   Hgb urine dipstick NEGATIVE  NEGATIVE    Bilirubin Urine NEGATIVE  NEGATIVE    Ketones, ur NEGATIVE  NEGATIVE (mg/dL)   Protein, ur NEGATIVE  NEGATIVE (mg/dL)   Urobilinogen, UA 0.2  0.0 - 1.0 (mg/dL)   Nitrite NEGATIVE  NEGATIVE    Leukocytes, UA NEGATIVE  NEGATIVE   POCT PREGNANCY, URINE   Collection Time   09/19/11  9:19 PM      Component Value Range   Preg Test, Ur POSITIVE (*) NEGATIVE   HCG, QUANTITATIVE, PREGNANCY   Collection Time   09/19/11 10:39 PM      Component Value Range   hCG, Beta Chain, [place] [address] (*) <5 (mIU/mL)  ABO/RH   Collection Time   09/19/11 10:39 PM      Component Value Range   ABO/RH(D) A POS    WET PREP, GENITAL   Collection Time   09/19/11 10:45 PM      Component Value Range   Yeast Wet Prep HPF POC NONE SEEN  NONE SEEN    Trich, Wet Prep NONE SEEN  NONE SEEN    Clue Cells Wet Prep HPF POC NONE SEEN  NONE SEEN    WBC, Wet Prep HPF POC FEW (*) NONE SEEN    Imaging Studies:  *RADIOLOGY REPORT*  Clinical Data: Pelvic pain.  TWIN OBSTETRIC <14WK [place] AND  TRANSVAGINAL OB US  Comparison: Prior ultrasound of pregnancy performed 11/15/2010  TWIN A  Heart rate: Fetal heartbeat seen, but its amplitude is too small to  measure.  Presentation: N/A  Amniotic Fluid (Subjective): N/A  CRL: 4.7 mm 6w 2d [place] EDC: 05/12/2012  Complete fetal anatomic evaluation could not be performed due to  early GA.  TWIN B  Heart rate: Fetal heartbeat seen, but its amplitude is too small  to measure.  Presentation: N/A  Amniotic Fluid (Subjective): N/A  CRL: 5.8 mm 6w 3d [place] EDC: 05/11/2012  Complete fetal anatomic evaluation could not be performed due to  early GA.  Maternal uterus/adnexae:  There is a 4.6 x 4.5 x 4.4 cm pedunculated fibroid arising at the  fundus of the uterus. The uterus is otherwise unremarkable in  appearance. No subchorionic hemorrhage is noted.  The ovaries are within normal limits. The right ovary measures 2.1  x 1.7 x 2.0 cm, while the left ovary measures 2.5 x 1.4 x 2.5 cm.  No suspicious adnexal masses are seen; there is no evidence for  ovarian torsion.  No free fluid is seen within the pelvic cul-de-sac.  IMPRESSION:  1. Live intrauterine twin pregnancy noted; both embryos  demonstrate fetal heartbeat, though still too small to measure.  Average crown-rump length of 5.3 mm, corresponding to a gestational  age of [redacted] weeks 3 days. This does not match the gestational age by  LMP, and reflects a new estimated date of delivery of 05/11/2012.  2. 4.6 cm pedunculated fibroid noted arising at the fundus of the  uterus.  Original Report Authenticated By: Tonia Ghent, M.D.    Assessment: Early IUP Twin pregnancy [redacted]w[redacted]d Pedunculated fibroid  Plan: Discharge home Rx Prenatal vitamins Preg verification, MCD info, and provider list given.  Traeton Bordas E. 09/20/2011,12:24 AM

## 2011-09-20 LAB — GC/CHLAMYDIA PROBE AMP, GENITAL: GC Probe Amp, Genital: NEGATIVE

## 2011-09-20 NOTE — Discharge Instructions (Signed)
Abdominal Pain During Pregnancy Abdominal discomfort is common in pregnancy. Most of the time, it does not cause harm. There are many causes of abdominal pain. Some causes are more serious than others. Some of the causes of abdominal pain in pregnancy are easily diagnosed. Occasionally, the diagnosis takes time to understand. Other times, the cause is not determined. Abdominal pain can be a sign that something is very wrong with the pregnancy, or the pain may have nothing to do with the pregnancy at all. For this reason, always tell your caregiver if you have any abdominal discomfort. CAUSES Common and harmless causes of abdominal pain include:  Constipation.   Excess gas and bloating.   Round ligament pain. This is pain that is felt in the folds of the groin.   The position the baby or placenta is in.   Baby kicks.   Braxton-Hicks contractions. These are mild contractions that do not cause cervical dilation.  Serious causes of abdominal pain include:  Ectopic pregnancy. This happens when a fertilized egg implants outside of the uterus.   Miscarriage.   Preterm labor. This is when labor starts at less than 37 weeks of pregnancy.   Placental abruption. This is when the placenta partially or completely separates from the uterus.   Preeclampsia. This is often associated with high blood pressure and has been referred to as "toxemia in pregnancy."   Uterine or amniotic fluid infections.  Causes unrelated to pregnancy include:  Urinary tract infection.   Gallbladder stones or inflammation.   Hepatitis or other liver illness.   Intestinal problems, stomach flu, food poisoning, or ulcer.   Appendicitis.   Kidney (renal) stones.   Kidney infection (pylonephritis).  HOME CARE INSTRUCTIONS  For mild pain:  Do not have sexual intercourse or put anything in your vagina until your symptoms go away completely.   Get plenty of rest until your pain improves. If your pain does not  improve in 1 hour, call your caregiver.   Drink clear fluids if you feel nauseous. Avoid solid food as long as you are uncomfortable or nauseous.   Only take medicine as directed by your caregiver.   Keep all follow-up appointments with your caregiver.  SEEK IMMEDIATE MEDICAL CARE IF:  You are bleeding, leaking fluid, or passing tissue from the vagina.   You have increasing pain or cramping.   You have persistent vomiting.   You have painful or bloody urination.   You have a fever.   You notice a decrease in your baby's movements.   You have extreme weakness or feel faint.   You have shortness of breath, with or without abdominal pain.   You develop a severe headache with abdominal pain.   You have abnormal vaginal discharge with abdominal pain.   You have persistent diarrhea.   You have abdominal pain that continues even after rest, or gets worse.  MAKE SURE YOU:   Understand these instructions.   Will watch your condition.   Will get help right away if you are not doing well or get worse.  Document Released: 07/21/2005 Document Revised: 04/02/2011 Document Reviewed: 02/14/2011 [facility] Patient Information 2012 [place], [place].Fibroids You have been diagnosed as having a fibroid. Fibroids are smooth muscle lumps (tumors) which can occur any place in a woman's body. They are usually in the womb (uterus). The most common problem (symptom) of fibroids is bleeding. Over time this may cause low red blood cells (anemia). Other symptoms include feelings of pressure and pain in  the pelvis. The diagnosis (learning what is wrong) of fibroids is made by physical exam. Sometimes tests such as an ultrasound are used. This is helpful when fibroids are felt around the ovaries and to look for tumors. TREATMENT   Most fibroids do not need surgical or medical treatment. Sometimes a tissue sample (biopsy) of the lining of the uterus is done to rule out cancer. If there is no cancer and only  a small amount of bleeding, the problem can be watched.   Hormonal treatment can improve the problem.   When surgery is needed, it can consist of removing the fibroid. Vaginal birth may not be possible after the removal of fibroids. This depends on where they are and the extent of surgery. When pregnancy occurs with fibroids it is usually normal.   Your caregiver can help decide which treatments are best for you.  HOME CARE INSTRUCTIONS   Do not use aspirin as this may increase bleeding problems.   If your periods (menses) are heavy, record the number of pads or tampons used per month. Bring this information to your caregiver. This can help them determine the best treatment for you.  SEEK IMMEDIATE MEDICAL CARE IF:  You have pelvic pain or cramps not controlled with medications, or experience a sudden increase in pain.   You have an increase of pelvic bleeding between and during menses.   You feel lightheaded or have fainting spells.   You develop worsening belly (abdominal) pain.  Document Released: 07/18/2000 Document Revised: 04/02/2011 Document Reviewed: 03/09/2008 [facility] Patient Information 2012 [place], [place].

## 2011-09-20 NOTE — Progress Notes (Signed)
Magda Kiel CNM in to see pt. And discuss d/c plan. Written and verbal d/c instructions given and understanding voiced.

## 2011-09-21 NOTE — ED Provider Notes (Signed)
Medical Screening exam and patient care preformed by advanced practice provider.  Agree with the above management.  

## 2011-10-03 ENCOUNTER — Encounter (HOSPITAL_COMMUNITY): Payer: Self-pay | Admitting: *Deleted

## 2011-10-03 ENCOUNTER — Inpatient Hospital Stay (HOSPITAL_COMMUNITY)
Admission: AD | Admit: 2011-10-03 | Discharge: 2011-10-03 | Disposition: A | Discharge status: Home / Self Care | Payer: Managed Care, Other (non HMO) | Source: Ambulatory Visit | Attending: Family Medicine | Admitting: Family Medicine

## 2011-10-03 DIAGNOSIS — O30009 Twin pregnancy, unspecified number of placenta and unspecified number of amniotic sacs, unspecified trimester: Secondary | ICD-10-CM | POA: Insufficient documentation

## 2011-10-03 DIAGNOSIS — T7840XA Allergy, unspecified, initial encounter: Secondary | ICD-10-CM

## 2011-10-03 DIAGNOSIS — H5789 Other specified disorders of eye and adnexa: Secondary | ICD-10-CM | POA: Insufficient documentation

## 2011-10-03 DIAGNOSIS — O9989 Other specified diseases and conditions complicating pregnancy, childbirth and the puerperium: Secondary | ICD-10-CM | POA: Insufficient documentation

## 2011-10-03 DIAGNOSIS — L299 Pruritus, unspecified: Secondary | ICD-10-CM | POA: Insufficient documentation

## 2011-10-03 MED ORDER — BUDESONIDE-FORMOTEROL FUMARATE 160-4.5 MCG/ACT IN AERO: 2.0000 | INHALATION_SPRAY | Freq: Two times a day (BID) | RESPIRATORY_TRACT | Status: DC

## 2011-10-03 MED ORDER — DIPHENHYDRAMINE HCL 25 MG PO CAPS
50.0000 mg | ORAL_CAPSULE | Freq: Once | ORAL | Status: AC
  Administered 2011-10-03: 50 mg via ORAL
  Filled 2011-10-03: qty 2

## 2011-10-03 MED ORDER — IPRATROPIUM-ALBUTEROL 18-103 MCG/ACT IN AERO: 2.0000 | INHALATION_SPRAY | Freq: Four times a day (QID) | RESPIRATORY_TRACT | Status: DC | PRN

## 2011-10-03 NOTE — Progress Notes (Signed)
Pt back to bathroom, and brought directly to rm for assessment.  Pt was walking  And became short of breath.  Used inhaler, gave some relief.  ( pt able to speak, though breathy)  ? Allergic reaction, - took last dose of "airy tab" no prior hx.  Thought reaction due to swollen eyes.  [facility] for HIGH RIsk

## 2011-10-03 NOTE — ED Provider Notes (Signed)
History     No chief complaint on file.  HPI  Pt is [redacted]w[redacted]d pregnant being seen at [facility] for prenatal care for massive obesity, twin gestation, hx of gestational diabetes.  Pt states that she was at work and felt her eyes swelling and slightly itching.  She also had some SOB but no tightness in her chest or wheezing.  Pt has allergies to shellfish and pencillin.  Pt does not know trigger to this reaction.  Pt has a history of asthma and also sleep apnea with previous pregnancy.  Pt has inhaler that she used at work earlier today that relieved any tightness in her chest.    Past Medical History  Diagnosis Date  . Gestational diabetes   . Morbid obesity   . Asthma   . Sleep apnea     Past Surgical History  Procedure Date  . Dilation and curettage of uterus   . Cesarean section     Family History  Problem Relation Age of Onset  . Allergies Mother   . Asthma Mother   . Anesthesia problems Neg Hx   . Hypotension Neg Hx   . Malignant hyperthermia Neg Hx   . Pseudochol deficiency Neg Hx     History  Substance Use Topics  . Smoking status: Former Games developer  . Smokeless tobacco: Not on file  . Alcohol Use: No    Allergies:  Allergies  Allergen Reactions  . Other Other (See Comments)    Fish, swelling and shortness of breath  . Penicillins Other (See Comments)    Swelling and shortness of breath    Prescriptions prior to admission  Medication Sig Dispense Refill  . acetaminophen (TYLENOL) 325 MG tablet Take 650 mg by mouth every 6 (six) hours as needed. As needed for pain      . Prenatal Vit-Fe Fumarate-FA (PRENATAL MULTIVITAMIN) TABS Take 1 tablet by mouth daily.        Review of Systems  Constitutional: Negative for fever and chills.  Respiratory: Positive for shortness of breath. Negative for wheezing.   Gastrointestinal: Positive for nausea. Negative for vomiting and abdominal pain.  Genitourinary: Negative for dysuria.  Neurological: Positive for dizziness.    Physical Exam   Blood pressure 136/78, pulse 101, resp. rate 20, last menstrual period 07/14/2011, SpO2 99.00%, unknown if currently breastfeeding.  Physical Exam  Vitals reviewed. Constitutional: She is oriented to person, place, and time. She appears well-developed and well-nourished.  HENT:  Head: Normocephalic.       Eyelids slightly edematous  Eyes: Pupils are equal, round, and reactive to light.  Neck: Normal range of motion. Neck supple.  Cardiovascular: Normal rate.   Respiratory: Effort normal and breath sounds normal. No respiratory distress. She has no wheezes. She has no rales.  GI: Soft.  Musculoskeletal: Normal range of motion.  Neurological: She is alert and oriented to person, place, and time.  Skin: Skin is warm and dry. No rash noted. No erythema.  Psychiatric: She has a normal mood and affect.    MAU Course  Procedures Benadryl 50mg  PO given to pt with relief of symptoms Informal ultrasound showed 2 viable fetuses with cardiac activity for pt's concern over babies Pt has history of asthma and needs refill for Symbicort and combivent Assessment and Plan  First trimester viable twins Allergic reaction ?trigger Hx of asthma and sleep apnea- will give prescriptions for Symbicort and Combivent prn  Adilene Areola 10/03/2011, 2:46 PM

## 2011-10-03 NOTE — ED Provider Notes (Signed)
Chart reviewed and agree with management and plan.  

## 2011-10-03 NOTE — ED Notes (Signed)
States that she feels like she is having an allergic reaction; allergies to [place] and seafood;

## 2011-10-03 NOTE — Discharge Instructions (Signed)

## 2011-11-13 ENCOUNTER — Encounter (HOSPITAL_COMMUNITY): Payer: Self-pay | Admitting: *Deleted

## 2011-11-13 ENCOUNTER — Inpatient Hospital Stay (HOSPITAL_COMMUNITY)
Admission: AD | Admit: 2011-11-13 | Discharge: 2011-11-13 | Disposition: A | Discharge status: Home / Self Care | Payer: Managed Care, Other (non HMO) | Source: Ambulatory Visit | Attending: Obstetrics & Gynecology | Admitting: Obstetrics & Gynecology

## 2011-11-13 DIAGNOSIS — K5900 Constipation, unspecified: Secondary | ICD-10-CM | POA: Insufficient documentation

## 2011-11-13 DIAGNOSIS — O99891 Other specified diseases and conditions complicating pregnancy: Secondary | ICD-10-CM | POA: Insufficient documentation

## 2011-11-13 NOTE — ED Provider Notes (Signed)
First Provider Initiated Contact with Patient 11/13/11 2040      Chief Complaint:  constipation  [name] is  31 y.o. G3P1011 at [redacted]w[redacted]d presents complaining of not having a bowel movement in 2 days.  She took Miralax and 2 stool softners 3 hours ago, but since she didn't have a BM yet she came to the MAU Obstetrical/Gynecological History: Menstrual History: OB History    Grav Para Term Preterm Abortions TAB SAB Ect Mult Living   3 1 1  0 1 0 1 0 0 1       Patient's last menstrual period was 07/14/2011.     Past Medical History: Past Medical History  Diagnosis Date  . Gestational diabetes   . Morbid obesity   . Asthma   . Sleep apnea     Past Surgical History: Past Surgical History  Procedure Date  . Dilation and curettage of uterus   . Cesarean section     Family History: Family History  Problem Relation Age of Onset  . Allergies Mother   . Asthma Mother   . Anesthesia problems Neg Hx   . Hypotension Neg Hx   . Malignant hyperthermia Neg Hx   . Pseudochol deficiency Neg Hx     Social History: History  Substance Use Topics  . Smoking status: Former Games developer  . Smokeless tobacco: Not on file  . Alcohol Use: No    Allergies:  Allergies  Allergen Reactions  . Other Other (See Comments)    Fish, swelling and shortness of breath  . Penicillins Other (See Comments)    Swelling and shortness of breath    Meds:  Prescriptions prior to admission  Medication Sig Dispense Refill  . acetaminophen (TYLENOL) 325 MG tablet Take 650 mg by mouth every 6 (six) hours as needed. As needed for pain      . albuterol-ipratropium (COMBIVENT) 18-103 MCG/ACT inhaler Inhale 2 puffs into the lungs every 6 (six) hours as needed for wheezing.  1 Inhaler  0  . budesonide-formoterol (SYMBICORT) 160-4.5 MCG/ACT inhaler Inhale 2 puffs into the lungs 2 (two) times daily.  1 Inhaler  12  . cephALEXin (KEFLEX) 500 MG capsule Take 500 mg by mouth 4 (four) times daily.      . folic  acid (FOLVITE) 1 MG tablet Take 1 mg by mouth every morning.      . ondansetron (ZOFRAN) 8 MG tablet Take by mouth every 8 (eight) hours as needed.      . Prenatal Vit-Fe Fumarate-FA (PRENATAL MULTIVITAMIN) TABS Take 1 tablet by mouth every morning.         Review of Systems - Please refer to the aforementioned patients' reports.     Physical Exam  Blood pressure 148/83, pulse 108, temperature 100 F (37.8 C), temperature source Oral, resp. rate 22, height 5\' 6"  (1.676 m), weight 187.449 kg (413 lb 4 oz), last menstrual period 07/14/2011, unknown if currently breastfeeding. GENERAL: Well-developed, well-nourished female in no acute distress.  LUNGS: Clear to auscultation bilaterally.  HEART: Regular rate and rhythm. ABDOMEN: Soft, nontender, nondistended, gravid. Bowel sounds present PELVIC:  Normal appearing discharge, cervix closed EXTREMITIES: Nontender, no edema, 2+ distal pulses.    Labs: No results found for this or any previous visit (from the past 24 hour(s)). Imaging Studies:  No results found.  Assessment: [name] is  31 y.o. G3P1011 at [redacted]w[redacted]d presents with constipation  Plan: Soap suds enema, disimpacted, Fleets enema, excellent results.  Pt feels much better.  Pt warned about Zofran increasing constipation, continue stool softners, dietary fiber  [name],[name] 4/11/20138:41 PM

## 2011-11-13 NOTE — MAU Note (Signed)
F. Cres-Dishmon, CNM at bedside.  Assessment done and poc discussed with pt.   

## 2011-11-13 NOTE — MAU Note (Signed)
Pt returned to bed after bowel movement.  States now her vagina hurts. CNM notified.

## 2011-11-13 NOTE — Progress Notes (Signed)
SSE per CNM.   

## 2011-11-13 NOTE — MAU Note (Signed)
PT SAYS  TODAY AT 7PM   - VERY CONSTIPATED - HAS TAKEN MIRILAX AND 2 STOOL SOFTENERS.-   NO RESULTS.  LAST BM  WAS ON Tuesday.[name]  SAYS HAS  HEMORROIDS -  BLEEDING.    TODAY WENT TO  DIABETIC   DR  AT [facility].     CALLED DR-    Lorie Phenix NAUSEA- TOOK ZOFRAN YESTERDAY-   HELPED

## 2011-11-13 NOTE — Discharge Instructions (Signed)

## 2011-11-16 NOTE — ED Provider Notes (Signed)
Medical Screening exam and patient care preformed by advanced practice provider.  Agree with the above management.  

## 2011-12-11 ENCOUNTER — Inpatient Hospital Stay (HOSPITAL_COMMUNITY)
Admission: AD | Admit: 2011-12-11 | Discharge: 2011-12-11 | Disposition: A | Discharge status: Home / Self Care | Payer: Managed Care, Other (non HMO) | Source: Ambulatory Visit | Attending: Obstetrics & Gynecology | Admitting: Obstetrics & Gynecology

## 2011-12-11 ENCOUNTER — Encounter (HOSPITAL_COMMUNITY): Payer: Self-pay

## 2011-12-11 DIAGNOSIS — O24919 Unspecified diabetes mellitus in pregnancy, unspecified trimester: Secondary | ICD-10-CM | POA: Insufficient documentation

## 2011-12-11 DIAGNOSIS — O99891 Other specified diseases and conditions complicating pregnancy: Secondary | ICD-10-CM | POA: Insufficient documentation

## 2011-12-11 DIAGNOSIS — R109 Unspecified abdominal pain: Secondary | ICD-10-CM

## 2011-12-11 DIAGNOSIS — E119 Type 2 diabetes mellitus without complications: Secondary | ICD-10-CM | POA: Insufficient documentation

## 2011-12-11 DIAGNOSIS — O26899 Other specified pregnancy related conditions, unspecified trimester: Secondary | ICD-10-CM

## 2011-12-11 DIAGNOSIS — R1031 Right lower quadrant pain: Secondary | ICD-10-CM | POA: Insufficient documentation

## 2011-12-11 HISTORY — DX: Essential (primary) hypertension: I10

## 2011-12-11 HISTORY — DX: Benign neoplasm of connective and other soft tissue, unspecified: D21.9

## 2011-12-11 HISTORY — DX: Reserved for concepts with insufficient information to code with codable children: IMO0002

## 2011-12-11 HISTORY — DX: Unspecified abnormal cytological findings in specimens from cervix uteri: R87.619

## 2011-12-11 HISTORY — DX: Urinary tract infection, site not specified: N39.0

## 2011-12-11 LAB — CBC
Hemoglobin: 11 g/dL — ABNORMAL LOW (ref 12.0–15.0)
MCH: 27 pg (ref 26.0–34.0)
MCHC: 31.6 g/dL (ref 30.0–36.0)
Platelets: 244 10*3/uL (ref 150–400)
RDW: 14.9 % (ref 11.5–15.5)

## 2011-12-11 LAB — URINALYSIS, ROUTINE W REFLEX MICROSCOPIC
Leukocytes, UA: NEGATIVE
Nitrite: NEGATIVE
Protein, ur: NEGATIVE mg/dL
Specific Gravity, Urine: 1.02 (ref 1.005–1.030)
Urobilinogen, UA: 0.2 mg/dL (ref 0.0–1.0)

## 2011-12-11 LAB — URINE MICROSCOPIC-ADD ON

## 2011-12-11 MED ORDER — OXYCODONE-ACETAMINOPHEN 5-325 MG PO TABS
2.0000 | ORAL_TABLET | Freq: Once | ORAL | Status: AC
  Administered 2011-12-11: 2 via ORAL
  Filled 2011-12-11: qty 2

## 2011-12-11 MED ORDER — OXYCODONE-ACETAMINOPHEN 5-325 MG PO TABS: 2.0000 | ORAL_TABLET | ORAL | Status: AC | PRN

## 2011-12-11 MED ORDER — OXYCODONE-ACETAMINOPHEN 5-325 MG PO TABS: 1.0000 | ORAL_TABLET | Freq: Once | ORAL | Status: DC

## 2011-12-11 MED ORDER — THIAMINE HCL 100 MG/ML IJ SOLN: Freq: Once | INTRAVENOUS | Status: DC

## 2011-12-11 NOTE — MAU Provider Note (Signed)
History     CSN: [id number]  Arrival date and time: 12/11/11 1346   None     Chief Complaint  Patient presents with  . Abdominal Pain   HPI Pt is 18w twin gestational pregnancy with right lower quadrant pain- previously evaluated on 12/2011 (tuesday).  Pt receives prenantal care in [facility]- she was previously evaluated here in 09/2011 with noted fibroid. Pt states she is being seen at MFM in [place].  She is known diabetic on insulin.  Pt had extensive workup on Tuesday night and was given Flexeril for her pain. FHT not audible with doppler due to massive obesity-a bedside ultrasound was performed to verify cardiac activity.   Her cervix was checked and was closed.  Pt also complains of bilateral lower back pain and bilateral lower abdominal pain.  She denies spotting or bleeding or UTI symptoms.  Pt has routine vomiting each a.m. With her pregnancy, which has unchanged.  She denies chills or fever.She has an appointment at [facility] tomorrow.  Past Medical History  Diagnosis Date  . Gestational diabetes   . Morbid obesity   . Asthma   . Sleep apnea     Past Surgical History  Procedure Date  . Dilation and curettage of uterus   . Cesarean section     Family History  Problem Relation Age of Onset  . Allergies Mother   . Asthma Mother   . Anesthesia problems Neg Hx   . Hypotension Neg Hx   . Malignant hyperthermia Neg Hx   . Pseudochol deficiency Neg Hx     History  Substance Use Topics  . Smoking status: Former Games developer  . Smokeless tobacco: Not on file  . Alcohol Use: No    Allergies:  Allergies  Allergen Reactions  . Other Other (See Comments)    Fish, swelling and shortness of breath  . Penicillins Other (See Comments)    Swelling and shortness of breath    Prescriptions prior to admission  Medication Sig Dispense Refill  . acetaminophen (TYLENOL) 325 MG tablet Take 650 mg by mouth every 6 (six) hours as needed. As needed for pain      . albuterol-ipratropium  (COMBIVENT) 18-103 MCG/ACT inhaler Inhale 2 puffs into the lungs every 6 (six) hours as needed for wheezing.  1 Inhaler  0  . budesonide-formoterol (SYMBICORT) 160-4.5 MCG/ACT inhaler Inhale 2 puffs into the lungs 2 (two) times daily.  1 Inhaler  12  . folic acid (FOLVITE) 1 MG tablet Take 1 mg by mouth every morning.      . ondansetron (ZOFRAN) 8 MG tablet Take by mouth every 8 (eight) hours as needed.      . Prenatal Vit-Fe Fumarate-FA (PRENATAL MULTIVITAMIN) TABS Take 1 tablet by mouth every morning.         Review of Systems  Constitutional: Negative for fever and chills.  Gastrointestinal: Positive for vomiting and abdominal pain. Negative for diarrhea and constipation.  Genitourinary: Negative for dysuria.  Musculoskeletal: Positive for back pain.   Physical Exam   Blood pressure 137/71, pulse 118, temperature 98.7 F (37.1 C), temperature source Oral, resp. rate 24, height 5\' 5"  (1.651 m), weight 412 lb 12.8 oz (187.245 kg), last menstrual period 07/14/2011, SpO2 98.00%, unknown if currently breastfeeding.  Physical Exam  Nursing note and vitals reviewed. Constitutional: She is oriented to person, place, and time. She appears well-developed and well-nourished.       Massive obesity  HENT:  Head: Normocephalic.  Eyes: Pupils  are equal, round, and reactive to light.  Neck: Normal range of motion. Neck supple.  Respiratory: Effort normal.  GI: Soft. She exhibits no distension. There is tenderness. There is no rebound.       Informal bedside ultrasound verified cardiac activity and fetal movement of both fetuses  Musculoskeletal: Normal range of motion.  Neurological: She is alert and oriented to person, place, and time.  Skin: Skin is warm and dry.    MAU Course  Procedures Results for orders placed during the hospital encounter of 12/11/11 (from the past 24 hour(s))  URINALYSIS, ROUTINE W REFLEX MICROSCOPIC     Status: Abnormal   Collection Time   12/11/11  2:03 PM       Component Value Range   Color, Urine YELLOW  YELLOW    APPearance CLEAR  CLEAR    Specific Gravity, Urine 1.020  1.005 - 1.030    pH 6.5  5.0 - 8.0    Glucose, UA 250 (*) NEGATIVE (mg/dL)   Hgb urine dipstick TRACE (*) NEGATIVE    Bilirubin Urine NEGATIVE  NEGATIVE    Ketones, ur NEGATIVE  NEGATIVE (mg/dL)   Protein, ur NEGATIVE  NEGATIVE (mg/dL)   Urobilinogen, UA 0.2  0.0 - 1.0 (mg/dL)   Nitrite NEGATIVE  NEGATIVE    Leukocytes, UA NEGATIVE  NEGATIVE   URINE MICROSCOPIC-ADD ON     Status: Normal   Collection Time   12/11/11  2:03 PM      Component Value Range   Squamous Epithelial / LPF RARE  RARE    RBC / HPF 0-2  <3 (RBC/hpf)  CBC     Status: Abnormal   Collection Time   12/11/11  2:31 PM      Component Value Range   WBC 9.9  4.0 - 10.5 (K/uL)   RBC 4.08  3.87 - 5.11 (MIL/uL)   Hemoglobin 11.0 (*) 12.0 - 15.0 (g/dL)   HCT 96.2 (*) 95.2 - 46.0 (%)   MCV 85.3  78.0 - 100.0 (fL)   MCH 27.0  26.0 - 34.0 (pg)   MCHC 31.6  30.0 - 36.0 (g/dL)   RDW 84.1  32.4 - 40.1 (%)   Platelets 244  150 - 400 (K/uL)  2 tablets percocet given by mouth with some improvement  Discussed probably round ligament pain and possible fibroid pain  Assessment and Plan  Abdominal pain in pregnancy DM F/u with MFM Recommend continue Flexeril and take Percocet as needed Recommend pregnancy belt  [name] 12/11/2011, 2:10 PM

## 2011-12-11 NOTE — MAU Provider Note (Signed)
Pt [redacted] weeks pregnant Medical Screening exam and patient care preformed by advanced practice provider.  Agree with the above management.

## 2011-12-11 NOTE — MAU Note (Signed)
Pain on rt side.  Pain is unchanged from Tues, not worse or better.

## 2011-12-11 NOTE — MAU Note (Signed)
Stinson at bedside for [place] to check FH

## 2011-12-11 NOTE — MAU Note (Signed)
Unable to get FH with doppler.  Discussed with NP, calling Dr Adrian Blackwater to check FH with bedside US.  Pt states they have not been able to get it with the doppler

## 2011-12-11 NOTE — MAU Note (Signed)
Patient states she has been getting her prenatal care in [facility]. Was evaluated on Tuesday for right side pain. States she was given Flexiril and Tylenol but does not help. States pain started in right lower abdomen and radiates up the right side. Denies any bleeding. Patient states she is having twins.

## 2012-02-01 ENCOUNTER — Encounter (HOSPITAL_COMMUNITY): Payer: Self-pay | Admitting: *Deleted

## 2012-02-01 ENCOUNTER — Inpatient Hospital Stay (HOSPITAL_COMMUNITY)
Admission: AD | Admit: 2012-02-01 | Discharge: 2012-02-01 | Disposition: A | Discharge status: Home / Self Care | Payer: Managed Care, Other (non HMO) | Source: Ambulatory Visit | Attending: Obstetrics and Gynecology | Admitting: Obstetrics and Gynecology

## 2012-02-01 DIAGNOSIS — N390 Urinary tract infection, site not specified: Secondary | ICD-10-CM

## 2012-02-01 DIAGNOSIS — B3731 Acute candidiasis of vulva and vagina: Secondary | ICD-10-CM

## 2012-02-01 DIAGNOSIS — O30009 Twin pregnancy, unspecified number of placenta and unspecified number of amniotic sacs, unspecified trimester: Secondary | ICD-10-CM | POA: Insufficient documentation

## 2012-02-01 DIAGNOSIS — B373 Candidiasis of vulva and vagina: Secondary | ICD-10-CM

## 2012-02-01 DIAGNOSIS — O2390 Unspecified genitourinary tract infection in pregnancy, unspecified trimester: Secondary | ICD-10-CM | POA: Insufficient documentation

## 2012-02-01 DIAGNOSIS — O26859 Spotting complicating pregnancy, unspecified trimester: Secondary | ICD-10-CM | POA: Insufficient documentation

## 2012-02-01 DIAGNOSIS — L293 Anogenital pruritus, unspecified: Secondary | ICD-10-CM | POA: Insufficient documentation

## 2012-02-01 LAB — URINALYSIS, ROUTINE W REFLEX MICROSCOPIC
Glucose, UA: NEGATIVE mg/dL
Ketones, ur: 15 mg/dL — AB
Protein, ur: 30 mg/dL — AB
Urobilinogen, UA: 0.2 mg/dL (ref 0.0–1.0)

## 2012-02-01 LAB — WET PREP, GENITAL: Yeast Wet Prep HPF POC: NONE SEEN

## 2012-02-01 LAB — URINE MICROSCOPIC-ADD ON

## 2012-02-01 MED ORDER — FLUCONAZOLE 150 MG PO TABS
150.0000 mg | ORAL_TABLET | ORAL | Status: AC
  Administered 2012-02-01: 150 mg via ORAL
  Filled 2012-02-01: qty 1

## 2012-02-01 MED ORDER — TERCONAZOLE 0.4 % VA CREA: 1.0000 | TOPICAL_CREAM | Freq: Every day | VAGINAL | Status: AC

## 2012-02-01 MED ORDER — NITROFURANTOIN MONOHYD MACRO 100 MG PO CAPS: 100.0000 mg | ORAL_CAPSULE | Freq: Two times a day (BID) | ORAL | Status: AC

## 2012-02-01 MED ORDER — FLUCONAZOLE 150 MG PO TABS: 150.0000 mg | ORAL_TABLET | ORAL | Status: AC

## 2012-02-01 NOTE — MAU Provider Note (Signed)
Chief Complaint:  Vaginal Bleeding   None     HPI  [name] is a 31 y.o. G3P1011 at [redacted]w[redacted]d with twins gestation presenting with vaginal itching, white thick discharge, and some pink spotting when wiping x2.  She reports good fetal movement, denies LOF, bright red vaginal bleeding, urinary symptoms, h/a, dizziness, n/v, or fever/chills.     Pregnancy Course: uncomplicated  Past Medical History: Past Medical History  Diagnosis Date  . Gestational diabetes   . Morbid obesity   . Asthma   . Sleep apnea   . Hypertension     not consistant  . Urinary tract infection   . Fibroid   . Abnormal Pap smear     f/u wnl    Past Surgical History: Past Surgical History  Procedure Date  . Dilation and curettage of uterus   . Cesarean section     Family History: Family History  Problem Relation Age of Onset  . Allergies Mother   . Asthma Mother   . Hypertension Mother   . Diabetes Mother   . Anesthesia problems Neg Hx   . Hypotension Neg Hx   . Malignant hyperthermia Neg Hx   . Pseudochol deficiency Neg Hx   . Hearing loss Neg Hx   . Diabetes Father   . Diabetes Maternal Grandmother   . Heart disease Maternal Grandmother     Social History: History  Substance Use Topics  . Smoking status: Former Games developer  . Smokeless tobacco: Not on file   Comment: before this preg  . Alcohol Use: No    Allergies:  Allergies  Allergen Reactions  . Other Other (See Comments)    Fish, swelling and shortness of breath  . Penicillins Other (See Comments)    Swelling and shortness of breath    Meds:  Prescriptions prior to admission  Medication Sig Dispense Refill  . acetaminophen (TYLENOL) 500 MG tablet Take 1,000 mg by mouth daily as needed. pain      . albuterol (PROVENTIL HFA;VENTOLIN HFA) 108 (90 BASE) MCG/ACT inhaler Inhale 2 puffs into the lungs every 6 (six) hours as needed. Asthma/SOB      . budesonide-formoterol (SYMBICORT) 160-4.5 MCG/ACT inhaler Inhale 2 puffs into  the lungs 2 (two) times daily.  1 Inhaler  12  . folic acid (FOLVITE) 1 MG tablet Take 1 mg by mouth every morning.      . insulin aspart (NOVOLOG) 100 UNIT/ML injection Inject 28 Units into the skin 3 (three) times daily before meals.       . insulin NPH (HUMULIN N,NOVOLIN N) 100 UNIT/ML injection Inject 48 Units into the skin 2 (two) times daily.      . ondansetron (ZOFRAN) 8 MG tablet Take 8 mg by mouth every 8 (eight) hours as needed. nausea      . Prenatal Vit-Fe Fumarate-FA (PRENATAL MULTIVITAMIN) TABS Take 1 tablet by mouth every morning.       . ranitidine (ZANTAC) 150 MG capsule Take 150 mg by mouth every evening.          Physical Exam  Blood pressure 127/60, pulse 133, temperature 99.1 F (37.3 C), temperature source Oral, resp. rate 20, height 5\' 7"  (1.702 m), weight 192.053 kg (423 lb 6.4 oz), last menstrual period 08/07/2011, unknown if currently breastfeeding. GENERAL: Well-developed, well-nourished female in no acute distress.  HEENT: normocephalic, good dentition HEART: normal rate RESP: normal effort ABDOMEN: Soft, nontender, gravid appropriate for gestational age EXTREMITIES: Nontender, no edema NEURO: alert and  oriented  Pelvic exam: Cervix pink, visually closed, without lesion, moderate amount white creamy discharge with clumps, vaginal walls and external genitalia with mild erythema.  Single 0.5cm pustule of folliculitis on left inner thigh, without erythema and mild tenderness to touch.    Dilation: Closed Effacement (%): Thick Cervical Position: Posterior Station: Ballotable Presentation: Vertex Exam by:: Sharen Counter, CNM  FHT: Unable to trace FHR with EFM Dr Emelda Fear at bedside for [facility].  Active fetal movement with adequate fluid noted for Baby A and Baby B.   Labs: Results for orders placed during the hospital encounter of 02/01/12 (from the past 24 hour(s))  URINALYSIS, ROUTINE W REFLEX MICROSCOPIC     Status: Abnormal   Collection Time    02/01/12  1:35 PM      Component Value Range   Color, Urine YELLOW  YELLOW   APPearance HAZY (*) CLEAR   Specific Gravity, Urine 1.020  1.005 - 1.030   pH 6.5  5.0 - 8.0   Glucose, UA NEGATIVE  NEGATIVE mg/dL   Hgb urine dipstick MODERATE (*) NEGATIVE   Bilirubin Urine SMALL (*) NEGATIVE   Ketones, ur 15 (*) NEGATIVE mg/dL   Protein, ur 30 (*) NEGATIVE mg/dL   Urobilinogen, UA 0.2  0.0 - 1.0 mg/dL   Nitrite NEGATIVE  NEGATIVE   Leukocytes, UA MODERATE (*) NEGATIVE  URINE MICROSCOPIC-ADD ON     Status: Abnormal   Collection Time   02/01/12  1:35 PM      Component Value Range   Squamous Epithelial / LPF FEW (*) RARE   WBC, UA 11-20  <3 WBC/hpf   RBC / HPF 7-10  <3 RBC/hpf   Bacteria, UA MANY (*) RARE  WET PREP, GENITAL     Status: Abnormal   Collection Time   02/01/12  2:45 PM      Component Value Range   Yeast Wet Prep HPF POC NONE SEEN  NONE SEEN   Trich, Wet Prep NONE SEEN  NONE SEEN   Clue Cells Wet Prep HPF POC NONE SEEN  NONE SEEN   WBC, Wet Prep HPF POC MODERATE (*) NONE SEEN     Imaging:  See above note about bedside sono  Assessment: Vaginal candidiasis UTI   Plan: Diflucan 150 mg x1 dose in MAU GC/Chlamydia pending D/C home Encourage PO fluids Discussed benefits of exercise in water with pt, pt plans to discuss with provider Diflucan second dose prescribed for 2 days from now, then once weekly for remainder of pregnancy Terazol 0.4% cream QHS for 7 nights Macrobid 100 mg BID x7 days F/U with prenatal provider  LEFTWICH-KIRBY, Parveen Freehling 6/30/20133:28 PM

## 2012-02-01 NOTE — MAU Note (Signed)
Pt reports she had some bleeding when she wiped today. Denies andy cramping or pain at this time. Reports some burning with urination. Thinks she may have a yeast infection and used some cream that was prescribed a few month ago.

## 2012-02-01 NOTE — Discharge Instructions (Signed)
[name], Adult A [name] (also called yeast, fungus and Monilia Bryant) is an overgrowth of yeast that can occur anywhere on the body. A yeast Bryant commonly occurs in warm, moist body areas. Usually, the Bryant remains localized but can spread to become a systemic Bryant. A yeast Bryant may be a sign of a more severe disease such as diabetes, leukemia, or AIDS. A yeast Bryant can occur in both men and women. In women, Stephanie vaginitis is a vaginal Bryant. It is one of the most common causes of vaginitis. Men usually do not have symptoms or know they have an Bryant until other problems develop. Men may find out they have a yeast Bryant because their sex partner has a yeast Bryant. Uncircumcised men are more likely to get a yeast Bryant than circumcised men. This is because the uncircumcised glans is not exposed to air and does not remain as dry as that of a circumcised glans. Older adults may develop yeast infections around dentures. CAUSES  Women  Antibiotics.   Steroid medication taken for a long time.   Being overweight (obese).   Diabetes.   Poor immune condition.   Certain serious medical conditions.   Immune suppressive medications for organ transplant patients.   Chemotherapy.   Pregnancy.   Menstration.   Stress and fatigue.   Intravenous drug use.   Oral contraceptives.   Wearing tight-fitting clothes in the crotch area.   Catching it from a sex partner who has a yeast Bryant.   Spermicide.   Intravenous, urinary, or other catheters.  Men  Catching it from a sex partner who has a yeast Bryant.   Having oral or anal sex with a person who has the Bryant.   Spermicide.   Diabetes.   Antibiotics.   Poor immune system.   Medications that suppress the immune system.   Intravenous drug use.   Intravenous, urinary, or other catheters.  SYMPTOMS  Women  Thick, white vaginal discharge.    Vaginal itching.   Redness and swelling in and around the vagina.   Irritation of the lips of the vagina and perineum.   Blisters on the vaginal lips and perineum.   Painful sexual intercourse.   Low blood sugar (hypoglycemia).   Painful urination.   Bladder infections.   Intestinal problems such as constipation, indigestion, bad breath, bloating, increase in gas, diarrhea, or loose stools.  Men  Men may develop intestinal problems such as constipation, indigestion, bad breath, bloating, increase in gas, diarrhea, or loose stools.   Dry, cracked skin on the penis with itching or discomfort.   Jock itch.   Dry, flaky skin.   Athlete's foot.   Hypoglycemia.  DIAGNOSIS  Women  A history and an exam are performed.   The discharge may be examined under a microscope.   A culture may be taken of the discharge.  Men  A history and an exam are performed.   Any discharge from the penis or areas of cracked skin will be looked at under the microscope and cultured.   Stool samples may be cultured.  TREATMENT  Women  Vaginal antifungal suppositories and creams.   Medicated creams to decrease irritation and itching on the outside of the vagina.   Warm compresses to the perineal area to decrease swelling and discomfort.   Oral antifungal medications.   Medicated vaginal suppositories or cream for repeated or recurrent infections.   Wash and dry the irritation areas before applying the cream.     Eating yogurt with lactobacillus may help with prevention and treatment.   Sometimes painting the vagina with gentian violet solution may help if creams and suppositories do not work.  Men  Antifungal creams and oral antifungal medications.   Sometimes treatment must continue for 30 days after the symptoms go away to prevent recurrence.  HOME CARE INSTRUCTIONS  Women  Use cotton underwear and avoid tight-fitting clothing.   Avoid colored, scented toilet paper and  deodorant tampons or pads.   Do not douche.   Keep your diabetes under control.   Finish all the prescribed medications.   Keep your skin clean and dry.   Consume milk or yogurt with lactobacillus active culture regularly. If you get frequent yeast infections and think that is what the Bryant is, there are over-the-counter medications that you can get. If the Bryant does not show healing in 3 days, talk to your caregiver.   Tell your sex partner you have a yeast Bryant. Your partner may need treatment also, especially if your Bryant does not clear up or recurs.  Men  Keep your skin clean and dry.   Keep your diabetes under control.   Finish all prescribed medications.   Tell your sex partner that you have a yeast Bryant so they can be treated if necessary.  SEEK MEDICAL CARE IF:   Your symptoms do not clear up or worsen in one week after treatment.   You have an oral temperature above 102 F (38.9 C).   You have trouble swallowing or eating for a prolonged time.   You develop blisters on and around your vagina.   You develop vaginal bleeding and it is not your menstrual period.   You develop abdominal pain.   You develop intestinal problems as mentioned above.   You get weak or lightheaded.   You have painful or increased urination.   You have pain during sexual intercourse.  MAKE SURE YOU:   Understand these instructions.   Will watch your condition.   Will get help right away if you are not doing well or get worse.  Document Released: 08/28/2004 Document Revised: 07/10/2011 Document Reviewed: 12/10/2009 [employer] Patient Information 2012 [employer]. 

## 2012-02-08 NOTE — MAU Provider Note (Signed)
Bedside sono by me. [email or web address] of Attending Supervision of Advanced Practitioner: Evaluation and management procedures were performed by the PA/NP/CNM/OB Fellow under my supervision/collaboration. Chart reviewed and agree with management and plan.  Meleane Selinger V 02/08/2012 10:47 AM

## 2012-03-14 ENCOUNTER — Other Ambulatory Visit: Payer: Self-pay | Admitting: Emergency Medicine

## 2012-03-14 ENCOUNTER — Inpatient Hospital Stay (HOSPITAL_COMMUNITY)
Admission: AD | Admit: 2012-03-14 | Discharge: 2012-03-14 | Disposition: A | Discharge status: Home / Self Care | Payer: Managed Care, Other (non HMO) | Source: Ambulatory Visit | Attending: Obstetrics & Gynecology | Admitting: Obstetrics & Gynecology

## 2012-03-14 ENCOUNTER — Inpatient Hospital Stay (HOSPITAL_COMMUNITY): Payer: Managed Care, Other (non HMO)

## 2012-03-14 DIAGNOSIS — O99210 Obesity complicating pregnancy, unspecified trimester: Secondary | ICD-10-CM

## 2012-03-14 DIAGNOSIS — R0602 Shortness of breath: Secondary | ICD-10-CM | POA: Insufficient documentation

## 2012-03-14 DIAGNOSIS — R109 Unspecified abdominal pain: Secondary | ICD-10-CM | POA: Insufficient documentation

## 2012-03-14 DIAGNOSIS — O30009 Twin pregnancy, unspecified number of placenta and unspecified number of amniotic sacs, unspecified trimester: Secondary | ICD-10-CM | POA: Insufficient documentation

## 2012-03-14 DIAGNOSIS — O99891 Other specified diseases and conditions complicating pregnancy: Secondary | ICD-10-CM | POA: Insufficient documentation

## 2012-03-14 DIAGNOSIS — J3489 Other specified disorders of nose and nasal sinuses: Secondary | ICD-10-CM | POA: Insufficient documentation

## 2012-03-14 DIAGNOSIS — R000 Tachycardia, unspecified: Secondary | ICD-10-CM | POA: Insufficient documentation

## 2012-03-14 DIAGNOSIS — J069 Acute upper respiratory infection, unspecified: Secondary | ICD-10-CM | POA: Insufficient documentation

## 2012-03-14 DIAGNOSIS — H6690 Otitis media, unspecified, unspecified ear: Secondary | ICD-10-CM

## 2012-03-14 DIAGNOSIS — N949 Unspecified condition associated with female genital organs and menstrual cycle: Secondary | ICD-10-CM

## 2012-03-14 MED ORDER — LORATADINE 10 MG PO TABS: 10.0000 mg | ORAL_TABLET | Freq: Every day | ORAL | Status: DC

## 2012-03-14 MED ORDER — AMOXICILLIN 500 MG PO CAPS: 500.0000 mg | ORAL_CAPSULE | Freq: Two times a day (BID) | ORAL | Status: AC

## 2012-03-14 MED ORDER — ACETAMINOPHEN 500 MG PO TABS
1000.0000 mg | ORAL_TABLET | Freq: Once | ORAL | Status: AC
Start: 2012-03-14 — End: 2012-03-14
  Administered 2012-03-14: 1000 mg via ORAL
  Filled 2012-03-14: qty 2

## 2012-03-14 MED ORDER — AMOXICILLIN 500 MG PO CAPS
500.0000 mg | ORAL_CAPSULE | Freq: Once | ORAL | Status: AC
  Administered 2012-03-14: 500 mg via ORAL
  Filled 2012-03-14: qty 1

## 2012-03-14 MED ORDER — FLUTICASONE PROPIONATE 50 MCG/ACT NA SUSP: 2.0000 | Freq: Every day | NASAL | Status: DC

## 2012-03-14 NOTE — MAU Note (Signed)
Patient is pregnant with twins prenatal care at [facility]

## 2012-03-14 NOTE — MAU Note (Signed)
Shortness of breath x 2 days has history of asthma [redacted] weeks gestation has used inhaler and stopped using Sudafed using Mucinex has O2 machine and used last night.

## 2012-03-14 NOTE — ED Provider Notes (Signed)
Chief Complaint:  Nasal Congestion and Shortness of Breath  HPI  [name] is a 31 y.o. G3P1011 at [redacted]w[redacted]d for twin gestation presenting with nasal congestion and shortness of breath.  She does report a history of asthma for which she takes symbicort and albuterol.  She has been using her albuterol 3-4 times a day for the last 1-2 days.  She reports doing fine until 3 days ago when she developed cold symptoms (her 75 month old son had a cold last week) including nasal congestion, stuffy ears, cough, shortness of breath.  The shortness of breath got worse when she stopped taking pseudoephedrine for the congestion yesterday and she feels like she has congestion in her chest now.  She started taking mucinex last night.  Denies fevers, chills, nausea, vomiting, diarrhea.  Does have some lower abdominal pain worse with movement.  Also complaining of headache.  Denies contractions, leakage of fluid or vaginal bleeding. Good fetal movement.   Pregnancy Course: Diabetes, borderline BPs, BMI >60  Past Medical History: Past Medical History  Diagnosis Date  . Gestational diabetes   . Morbid obesity   . Asthma   . Sleep apnea   . Hypertension     not consistant  . Urinary tract infection   . Fibroid   . Abnormal Pap smear     f/u wnl    Past Surgical History: Past Surgical History  Procedure Date  . Dilation and curettage of uterus   . Cesarean section     Family History: Family History  Problem Relation Age of Onset  . Allergies Mother   . Asthma Mother   . Hypertension Mother   . Diabetes Mother   . Anesthesia problems Neg Hx   . Hypotension Neg Hx   . Malignant hyperthermia Neg Hx   . Pseudochol deficiency Neg Hx   . Hearing loss Neg Hx   . Diabetes Father   . Diabetes Maternal Grandmother   . Heart disease Maternal Grandmother     Social History: History  Substance Use Topics  . Smoking status: Former Games developer  . Smokeless tobacco: Not on file   Comment: before this  preg  . Alcohol Use: No    Allergies:  Allergies  Allergen Reactions  . Other Anaphylaxis  . Penicillins Anaphylaxis and Swelling  Reports has taken amoxicillin without trouble before  Meds:  Prescriptions prior to admission  Medication Sig Dispense Refill  . albuterol (PROVENTIL HFA;VENTOLIN HFA) 108 (90 BASE) MCG/ACT inhaler Inhale 2 puffs into the lungs every 6 (six) hours as needed. Asthma/SOB      . budesonide-formoterol (SYMBICORT) 160-4.5 MCG/ACT inhaler Inhale 2 puffs into the lungs 2 (two) times daily.  1 Inhaler  12  . folic acid (FOLVITE) 1 MG tablet Take 1 mg by mouth every morning.      . insulin aspart (NOVOLOG) 100 UNIT/ML injection Inject 48 Units into the skin 3 (three) times daily before meals.       . insulin NPH (HUMULIN N,NOVOLIN N) 100 UNIT/ML injection Inject 60 Units into the skin 2 (two) times daily.       . ondansetron (ZOFRAN) 8 MG tablet Take 8 mg by mouth every 8 (eight) hours as needed. nausea      . Prenatal Vit-Fe Fumarate-FA (PRENATAL MULTIVITAMIN) TABS Take 1 tablet by mouth every morning.       . ranitidine (ZANTAC) 150 MG capsule Take 150 mg by mouth 2 (two) times daily.  Physical Exam  Blood pressure 129/85, pulse 115, temperature 98.3 F (36.8 C), temperature source Oral, resp. rate 20, height 5' 6.5" (1.689 m), weight 433 lb 9.6 oz (196.68 kg), last menstrual period 08/07/2011, SpO2 98.00%, unknown if currently breastfeeding. GENERAL: Morbidly obese female in no acute distress.  HEENT: normocephalic, good dentition, no cervical LAD, R TM erythematous at borders, L TM very erythematous with loss of light reflex likely due to retraction, no tonsillar exudate, mild pharyngeal erythema, + cobblestoning HEART: distant heart sounds, tachycardic, regular rhythm, no murmurs RESP: appears short of breath, lungs sound difficult to auscultate given body habitus, but no wheezes or rales appreciated, no accessory muscle use ABDOMEN: Soft, obese,  mild tenderness just inferior and to the left of the umbilicus EXTREMITIES: Nontender, no edema NEURO: alert and oriented  SPECULUM EXAM: deferred  FHT:  Unable to trace with external monitor Contractions: irregular, mild per patient report    Assessment: 31 yo G3P1011 at [redacted]w[redacted]d with shortness of breath and congestion - likely viral URI: no hypoxia or signs of lung involvement, no wheezing to suggest asthma exacerbation - likely early AOM - Round ligament pain - tachycardia - per patient this has been present throughout pregnancy and she had a work up last month (including echo) that was negative   Plan: - will give tylenol for headache and ligament pain - BPP to assess fetal wellbeing --> wnl x2 - Amoxicillin for ear infection, will give first dose here given report of PCN allergy although she describes taking amoxicillin in the past without trouble - Flonase and loratadine for congestion - okay to continue mucinex - advise to avoid sudafed  - f/u with regular OB in the next week  BOOTH, ERIN 8/11/20138:53 AM   I have seen and examined this patient. No acute findings. Slightly labored breathing at rest, resolves when in conversation. No retractions or accessory muscle use. No evidence of air hunger  LUNGS: CTAB A&P. No wheezing, rales or rhonchi appreciated. O2 Sat 99% on room air. I agree with the assessment and plan as above. Clevon Khader E.

## 2012-03-14 NOTE — MAU Note (Signed)
Pt presents to MAU with chief complaint of SOB and congestion. Pt is [redacted]w[redacted]d; twin gestation, care being received at [facility] due to gestational DM and BMI >69. Pt says she has become more short of breath over the last several days, using her albuterol daily with minimal relief.

## 2012-07-12 ENCOUNTER — Encounter: Payer: Self-pay | Admitting: Obstetrics and Gynecology

## 2012-08-09 ENCOUNTER — Encounter: Payer: Self-pay | Admitting: Obstetrics and Gynecology

## 2012-09-13 ENCOUNTER — Other Ambulatory Visit: Payer: Self-pay

## 2012-09-13 ENCOUNTER — Ambulatory Visit: Payer: Managed Care, Other (non HMO)

## 2012-09-13 ENCOUNTER — Ambulatory Visit: Payer: Managed Care, Other (non HMO) | Admitting: Obstetrics and Gynecology

## 2012-09-13 ENCOUNTER — Encounter: Payer: Self-pay | Admitting: Obstetrics and Gynecology

## 2012-09-13 VITALS — BP 126/82 | Temp 98.9°F | Wt >= 6400 oz

## 2012-09-13 DIAGNOSIS — J45909 Unspecified asthma, uncomplicated: Secondary | ICD-10-CM | POA: Insufficient documentation

## 2012-09-13 DIAGNOSIS — R102 Pelvic and perineal pain: Secondary | ICD-10-CM

## 2012-09-13 DIAGNOSIS — Z8679 Personal history of other diseases of the circulatory system: Secondary | ICD-10-CM | POA: Insufficient documentation

## 2012-09-13 DIAGNOSIS — Z6841 Body Mass Index (BMI) 40.0 and over, adult: Secondary | ICD-10-CM | POA: Insufficient documentation

## 2012-09-13 DIAGNOSIS — N390 Urinary tract infection, site not specified: Secondary | ICD-10-CM

## 2012-09-13 DIAGNOSIS — E119 Type 2 diabetes mellitus without complications: Secondary | ICD-10-CM | POA: Insufficient documentation

## 2012-09-13 DIAGNOSIS — N920 Excessive and frequent menstruation with regular cycle: Secondary | ICD-10-CM

## 2012-09-13 DIAGNOSIS — N951 Menopausal and female climacteric states: Secondary | ICD-10-CM

## 2012-09-13 DIAGNOSIS — R350 Frequency of micturition: Secondary | ICD-10-CM

## 2012-09-13 HISTORY — DX: Type 2 diabetes mellitus without complications: E11.9

## 2012-09-13 LAB — CBC
HCT: 37.1 % (ref 36.0–46.0)
Hemoglobin: 12.1 g/dL (ref 12.0–15.0)
MCH: 26.8 pg (ref 26.0–34.0)
MCHC: 32.6 g/dL (ref 30.0–36.0)

## 2012-09-13 LAB — POCT URINALYSIS DIPSTICK
Bilirubin, UA: NEGATIVE
Blood, UA: NEGATIVE
Ketones, UA: NEGATIVE
pH, UA: 6

## 2012-09-13 MED ORDER — FLUCONAZOLE 150 MG PO TABS: 150.0000 mg | ORAL_TABLET | Freq: Once | ORAL | Status: DC

## 2012-09-13 MED ORDER — NITROFURANTOIN MONOHYD MACRO 100 MG PO CAPS: 100.0000 mg | ORAL_CAPSULE | Freq: Two times a day (BID) | ORAL | Status: DC

## 2012-09-13 NOTE — Patient Instructions (Addendum)
Levonorgestrel intrauterine device (IUD) What is this medicine? LEVONORGESTREL IUD (LEE voe nor jes trel) is a contraceptive (birth control) device. It is used to prevent pregnancy and to treat heavy bleeding that occurs during your period. It can be used for up to 5 years. This medicine may be used for other purposes; ask your health care provider or pharmacist if you have questions. What should I tell my health care provider before I take this medicine? They need to know if you have any of these conditions: -abnormal Pap smear -cancer of the breast, uterus, or cervix -diabetes -endometritis -genital or pelvic infection now or in the past -have more than one sexual partner or your partner has more than one partner -heart disease -history of an ectopic or tubal pregnancy -immune system problems -IUD in place -liver disease or tumor -problems with blood clots or take blood-thinners -use intravenous drugs -uterus of unusual shape -vaginal bleeding that has not been explained -an unusual or allergic reaction to levonorgestrel, other hormones, silicone, or polyethylene, medicines, foods, dyes, or preservatives -pregnant or trying to get pregnant -breast-feeding How should I use this medicine? This device is placed inside the uterus by a health care professional. Talk to your pediatrician regarding the use of this medicine in children. Special care may be needed. Overdosage: If you think you have taken too much of this medicine contact a poison control center or emergency room at once. NOTE: This medicine is only for you. Do not share this medicine with others. What if I miss a dose? This does not apply. What may interact with this medicine? Do not take this medicine with any of the following medications: -amprenavir -bosentan -fosamprenavir This medicine may also interact with the following medications: -aprepitant -barbiturate medicines for inducing sleep or treating  seizures -bexarotene -griseofulvin -medicines to treat seizures like carbamazepine, ethotoin, felbamate, oxcarbazepine, phenytoin, topiramate -modafinil -pioglitazone -rifabutin -rifampin -rifapentine -some medicines to treat HIV infection like atazanavir, indinavir, lopinavir, nelfinavir, tipranavir, ritonavir -St. John's wort -warfarin This list may not describe all possible interactions. Give your health care provider a list of all the medicines, herbs, non-prescription drugs, or dietary supplements you use. Also tell them if you smoke, drink alcohol, or use illegal drugs. Some items may interact with your medicine. What should I watch for while using this medicine? Visit your doctor or health care professional for regular check ups. See your doctor if you or your partner has sexual contact with others, becomes HIV positive, or gets a sexual transmitted disease. This product does not protect you against HIV infection (AIDS) or other sexually transmitted diseases. You can check the placement of the IUD yourself by reaching up to the top of your vagina with clean fingers to feel the threads. Do not pull on the threads. It is a good habit to check placement after each menstrual period. Call your doctor right away if you feel more of the IUD than just the threads or if you cannot feel the threads at all. The IUD may come out by itself. You may become pregnant if the device comes out. If you notice that the IUD has come out use a backup birth control method like condoms and call your health care provider. Using tampons will not change the position of the IUD and are okay to use during your period. What side effects may I notice from receiving this medicine? Side effects that you should report to your doctor or health care professional as soon as possible: -allergic reactions   like skin rash, itching or hives, swelling of the face, lips, or tongue -fever, flu-like symptoms -genital sores -high  blood pressure -no menstrual period for 6 weeks during use -pain, swelling, warmth in the leg -pelvic pain or tenderness -severe or sudden headache -signs of pregnancy -stomach cramping -sudden shortness of breath -trouble with balance, talking, or walking -unusual vaginal bleeding, discharge -yellowing of the eyes or skin Side effects that usually do not require medical attention (report to your doctor or health care professional if they continue or are bothersome): -acne -breast pain -change in sex drive or performance -changes in weight -cramping, dizziness, or faintness while the device is being inserted -headache -irregular menstrual bleeding within first 3 to 6 months of use -nausea This list may not describe all possible side effects. Call your doctor for medical advice about side effects. You may report side effects to FDA at 1-800-FDA-1088. Where should I keep my medicine? This does not apply. NOTE: This sheet is a summary. It may not cover all possible information. If you have questions about this medicine, talk to your doctor, pharmacist, or health care provider.   [employer] Patient Information 2013 [place], [place].   Endometrial Ablation Endometrial ablation removes the lining of the uterus (endometrium). It is usually a same day, outpatient treatment. Ablation helps avoid major surgery (such as a hysterectomy). A hysterectomy is removal of the cervix and uterus. Endometrial ablation has less risk and complications, has a shorter recovery period and is less expensive. After endometrial ablation, most women will have little or no menstrual bleeding. You may not keep your fertility. Pregnancy is no longer likely after this procedure but if you are pre-menopausal, you still need to use a reliable method of birth control following the procedure because pregnancy can occur. REASONS TO HAVE THE PROCEDURE MAY INCLUDE:  Heavy periods.  Bleeding that is causing  anemia.  Anovulatory bleeding, very irregular, bleeding.  Bleeding submucous fibroids (on the lining inside the uterus) if they are smaller than 3 centimeters. REASONS NOT TO HAVE THE PROCEDURE MAY INCLUDE:  You wish to have more children.  You have a pre-cancerous or cancerous problem. The cause of any abnormal bleeding must be diagnosed before having the procedure.  You have pain coming from the uterus.  You have a submucus fibroid larger than 3 centimeters.  You recently had a baby.  You recently had an infection in the uterus.  You have a severe retro-flexed, tipped uterus and cannot insert the instrument to do the ablation.  You had a Cesarean section or deep major surgery on the uterus.  The inner cavity of the uterus is too large for the endometrial ablation instrument. RISKS AND COMPLICATIONS   Perforation of the uterus.  Bleeding.  Infection of the uterus, bladder or vagina.  Injury to surrounding organs.  Cutting the cervix.  An air bubble to the lung (air embolus).  Pregnancy following the procedure.  Failure of the procedure to help the problem requiring hysterectomy.  Decreased ability to diagnose cancer in the lining of the uterus. BEFORE THE PROCEDURE  The lining of the uterus must be tested to make sure there is no pre-cancerous or cancer cells present.  Medications may be given to make the lining of the uterus thinner.  Ultrasound may be used to evaluate the size and look for abnormalities of the uterus.  Future pregnancy is not desired. PROCEDURE  There are different ways to destroy the lining of the uterus.   Resectoscope - radio  frequency-alternating electric current is the most common one used.  Cryotherapy - freezing the lining of the uterus.  Heated Free Liquid - heated salt (saline) solution inserted into the uterus.  Microwave - uses high energy microwaves in the uterus.  Thermal Balloon - a catheter with a balloon tip is  inserted into the uterus and filled with heated fluid. Your caregiver will talk with you about the method used in this clinic. They will also instruct you on the pros and cons of the procedure. Endometrial ablation is performed along with a procedure called operative hysteroscopy. A narrow viewing tube is inserted through the birth canal (vagina) and through the cervix into the uterus. A tiny camera attached to the viewing tube (hysteroscope) allows the uterine cavity to be shown on a TV monitor during surgery. Your uterus is filled with a harmless liquid to make the procedure easier. The lining of the uterus is then removed. The lining can also be removed with a resectoscope which allows your surgeon to cut away the lining of the uterus under direct vision. Usually, you will be able to go home within an hour after the procedure. HOME CARE INSTRUCTIONS   Do not drive for 24 hours.  No tampons, douching or intercourse for 2 weeks or until your caregiver approves.  Rest at home for 24 to 48 hours. You may then resume normal activities unless told differently by your caregiver.  Take your temperature two times a day for 4 days, and record it.  Take any medications your caregiver has ordered, as directed.  Use some form of contraception if you are pre-menopausal and do not want to get pregnant. Bleeding after the procedure is normal. It varies from light spotting and mildly watery to bloody discharge for 4 to 6 weeks. You may also have mild cramping. Only take over-the-counter or prescription medicines for pain, discomfort, or fever as directed by your caregiver. Do not use aspirin, as this may aggravate bleeding. Frequent urination during the first 24 hours is normal. You will not know how effective your surgery is until at least 3 months after the surgery. SEEK IMMEDIATE MEDICAL CARE IF:   Bleeding is heavier than a normal menstrual cycle.  An oral temperature above 102 F (38.9 C)  develops.  You have increasing cramps or pains not relieved with medication or develop belly (abdominal) pain which does not seem to be related to the same area of earlier cramping and pain.  You are light headed, weak or have fainting episodes.  You develop pain in the shoulder strap areas.  You have chest or leg pain.  You have abnormal vaginal discharge.  You have painful urination. Document Released: 05/30/2004 Document Revised: 10/13/2011 Document Reviewed: 08/28/2007 [facility] Patient Information 2013 [place], [place].  [email or web address]

## 2012-09-13 NOTE — Progress Notes (Signed)
  Current contraception: tubal ligation. Hormone replacement therapy: No New medication: No  History of ZOX:WRUE  History of infertility: no. History of abnormal Pap smear: yes - long time 2003 History of fibroids: Yes   Increased stress: Yes work stress  Abnormal bleeding pattern started: started first cycle after patient has her twins.Pain started at that time as well.

## 2012-09-13 NOTE — Progress Notes (Signed)
31 YO S/P C-section (twins)  5 months ago (03/17/13) presents with complaints of severe pelvic pain with first menses after delivery (04/2012) with flow x 4 days with pad change every hour and a half (using Poise pads) and cramps 8/10 on 10 point pain scale with no relief with OTC analgesia.  Was told when she was pregnant that she had fibroids but has never had an ultrasound when she wasn't pregnant.  Admits to urinary frequency or urgency  but denies bowel symptoms, dysuria, flank pain or intermenstrual bleeding.  Lastly patient reports night sweats and nocturnal hot flashes but has not missed any menstrual periods.  O: Abdomen: soft with tenderness and voluntary guarding in both lower quadrants      Pelvic:  EGBUS-wnl (resolved ? follicular on left vulvar area), vagina-normal, cervix-recessed without lesions, uterus/adnexae-exam limited     by habitus but no palpable masses  U/A: pH-6.0, SG-1.020, leukocytes 1+, protein trace, glucose  1+ UPT-negative  Pelvic U/S: uterus-8.56 x 5.61 x 4.64 cm, endometrium 0.644 cm, right ovary-4.15 x 3.11 x 2.69 cm, left ovary not visualized; fundal fibroid: 3.9 x 4.0 x 4.0 cm C-section scar noted  A: Menorrhagia     Fibroid     UTI     BMI > 69     Diabetes Mellitus II     Dysmenorrhea  P: TSH, CBC, PRL-pending       Macrobid 100 mg  #14 1 po pc bid x 7 days no refills       Diflucan 150 mg #1 1 po stat 1 refill       Reviewed management options for menorrhagia/fibroids: hormonal contraception, Lysteda, myomectomy, mirena IUD, endometrial ablation and hyst.      Information given on Lysteda, Mirena, Fibroids and Endometrial Ablation       Patient to review and decide course of action but is leaning toward ablation       To discuss with Dr. Normand Sloop, patient's presentation, history and ask for her recommendation.        RTO-as scheduled or prn  Amil Bouwman, [name]

## 2012-09-15 LAB — URINE CULTURE: Colony Count: 30000

## 2012-09-16 ENCOUNTER — Telehealth: Payer: Self-pay | Admitting: Obstetrics and Gynecology

## 2012-09-16 NOTE — Telephone Encounter (Signed)
Call to patient after consult with Dr. Normand Sloop, to advise patient of our need for an operative report of her C-section if she wants to proceed with an ablation. Patient has decided that she wants a Mirena IUD instead.  To forward chart to Lenetta Quaker to verify benefits and to schedule, with Dr. Normand Sloop with ultrasound guidance.  Patient was agreeable.  Juwana Thoreson, [name]

## 2012-09-23 ENCOUNTER — Telehealth: Payer: Self-pay | Admitting: Obstetrics and Gynecology

## 2012-09-23 NOTE — Telephone Encounter (Signed)
Spoke with pt rgd msg pt states rgd msg pt states almost done with abx for uti but still having urinary frequency,urgency and bleeding with urination wants a eval pt has appt 09/27/12 at 1;30 with rp pt voice understanding

## 2012-09-24 ENCOUNTER — Encounter: Payer: Self-pay | Admitting: Certified Nurse Midwife

## 2012-09-24 ENCOUNTER — Ambulatory Visit: Payer: Managed Care, Other (non HMO) | Admitting: Certified Nurse Midwife

## 2012-09-24 VITALS — BP 140/82 | HR 82 | Temp 99.3°F | Resp 18 | Wt >= 6400 oz

## 2012-09-24 DIAGNOSIS — R899 Unspecified abnormal finding in specimens from other organs, systems and tissues: Secondary | ICD-10-CM

## 2012-09-24 DIAGNOSIS — R350 Frequency of micturition: Secondary | ICD-10-CM

## 2012-09-24 DIAGNOSIS — E119 Type 2 diabetes mellitus without complications: Secondary | ICD-10-CM

## 2012-09-24 DIAGNOSIS — M549 Dorsalgia, unspecified: Secondary | ICD-10-CM

## 2012-09-24 LAB — POCT URINALYSIS DIPSTICK
Protein, UA: NEGATIVE
Spec Grav, UA: 1.015
Urobilinogen, UA: 0.2
pH, UA: 6

## 2012-09-24 LAB — HEMOGLOBIN A1C: Hgb A1c MFr Bld: 9.5 % — ABNORMAL HIGH (ref <5.7)

## 2012-09-24 NOTE — Progress Notes (Signed)
[name]. Here with urinary frequency and low back pain without injury       No dysuria, no fever, rare nausea       No chest pain, SOB       Taking macrobid Bid (4 left)       No other medication taken            H/O irregular menses with pain (Seen 09/17/2012 with E. Powell-PAC)  Had [place] that was wnl except one fibroid . Considering Mirena IUD       Treated with macrobid for UTI symptoms-UC wnl            **            Other Hx.- Delivered High risk clinic - for DM (insulin) , twins, no HTN , morbid obesity.  Had BTL            States meds were d/c at pp visit         Denies vaginal d/c, or pelvic pain at present- occasional cramps with menses O-  U/A- 1000 glucose, neg. Nitrites, neg. ketones       RBS-413   A!C pending       PE: alert and oriented              Heart: RRR without murmur   Lungs:  Clear to auscultation   No CVAT  Back-Nl findings              Abd: No RBT, Obese, Non-tender:  Exam limited by obesity A-  Type two DM with H/O gestational DM with twin pregnancy       Urinary frequency - related to DM       Morbid obesity (424)       S/P BTL  . Neg UPT 09/17/2012       Stable asthma  P-  Consulted with Dr. Estanislado Pandy (In office)       Metformin 500 mgs Bid (Bryant. Has prescription and refills)       Discussed ADA diet briefly       Bryant. Referred to Dr. Maggie Font (Has seen in past)       Nurse-Jackie will schedule Bryant. On Monday       Bryant. To follow up with Larey Dresser as scheduled re: fibroid/mirena  C. Armer, CNM, FNP-BC

## 2012-09-27 ENCOUNTER — Telehealth: Payer: Self-pay

## 2012-09-27 ENCOUNTER — Encounter: Payer: Managed Care, Other (non HMO) | Admitting: Obstetrics and Gynecology

## 2012-09-27 NOTE — Telephone Encounter (Signed)
Spoke to pt . Dr Allyne Gee' NP Debria Garret can see her on 10/04/2012 @ 3 pm. Pt states that will work, and she will keep that appt. Melody Comas A

## 2012-09-27 NOTE — Telephone Encounter (Signed)
Spoke to pt to let her know that I had set up an appt for her to be seen PCP At 1 pm tomorrow. Pt states she cannot do that, due to her work schedule. I left message with Dr. Allyne Gee' nurse to cb to see if we can get her in next Mon or Tues, between the hrs of 1:30-3 pm. I am waiting to hear back. Melody Comas A

## 2012-09-28 ENCOUNTER — Ambulatory Visit: Payer: Managed Care, Other (non HMO) | Admitting: Family Medicine

## 2012-10-01 ENCOUNTER — Encounter: Payer: Self-pay | Admitting: Certified Nurse Midwife

## 2012-10-06 ENCOUNTER — Telehealth: Payer: Self-pay

## 2012-10-06 NOTE — Telephone Encounter (Signed)
Called Dr. Allyne Gee' office to get notes from visit that pt had scheduled for 10/04/2012 with NP Debria Garret. Apparently, pt did not show up for that visit on that day. She has been R/S to 10/11/2012. C. Armer was made aware of this. Melody Comas A

## 2013-04-05 ENCOUNTER — Inpatient Hospital Stay (HOSPITAL_COMMUNITY)
Admission: AD | Admit: 2013-04-05 | Discharge: 2013-04-05 | Disposition: A | Discharge status: Home / Self Care | Payer: Managed Care, Other (non HMO) | Source: Ambulatory Visit | Attending: Obstetrics & Gynecology | Admitting: Obstetrics & Gynecology

## 2013-04-05 ENCOUNTER — Inpatient Hospital Stay (HOSPITAL_COMMUNITY): Payer: Managed Care, Other (non HMO)

## 2013-04-05 DIAGNOSIS — R509 Fever, unspecified: Secondary | ICD-10-CM | POA: Insufficient documentation

## 2013-04-05 DIAGNOSIS — J020 Streptococcal pharyngitis: Secondary | ICD-10-CM

## 2013-04-05 DIAGNOSIS — R000 Tachycardia, unspecified: Secondary | ICD-10-CM | POA: Insufficient documentation

## 2013-04-05 DIAGNOSIS — R0602 Shortness of breath: Secondary | ICD-10-CM | POA: Insufficient documentation

## 2013-04-05 DIAGNOSIS — J189 Pneumonia, unspecified organism: Secondary | ICD-10-CM

## 2013-04-05 DIAGNOSIS — E119 Type 2 diabetes mellitus without complications: Secondary | ICD-10-CM | POA: Insufficient documentation

## 2013-04-05 DIAGNOSIS — J029 Acute pharyngitis, unspecified: Secondary | ICD-10-CM | POA: Insufficient documentation

## 2013-04-05 DIAGNOSIS — R079 Chest pain, unspecified: Secondary | ICD-10-CM | POA: Insufficient documentation

## 2013-04-05 DIAGNOSIS — I10 Essential (primary) hypertension: Secondary | ICD-10-CM | POA: Insufficient documentation

## 2013-04-05 LAB — COMPREHENSIVE METABOLIC PANEL
Albumin: 3.3 g/dL — ABNORMAL LOW (ref 3.5–5.2)
Alkaline Phosphatase: 67 U/L (ref 39–117)
BUN: 6 mg/dL (ref 6–23)
Potassium: 3.5 mEq/L (ref 3.5–5.1)
Sodium: 135 mEq/L (ref 135–145)
Total Protein: 7 g/dL (ref 6.0–8.3)

## 2013-04-05 LAB — URINALYSIS, ROUTINE W REFLEX MICROSCOPIC
Glucose, UA: NEGATIVE mg/dL
Nitrite: NEGATIVE
Protein, ur: NEGATIVE mg/dL
Specific Gravity, Urine: 1.02 (ref 1.005–1.030)
Urobilinogen, UA: 4 mg/dL — ABNORMAL HIGH (ref 0.0–1.0)
pH: 8.5 — ABNORMAL HIGH (ref 5.0–8.0)

## 2013-04-05 LAB — CBC WITH DIFFERENTIAL/PLATELET
Basophils Relative: 0 % (ref 0–1)
Eosinophils Absolute: 0.2 10*3/uL (ref 0.0–0.7)
MCH: 27 pg (ref 26.0–34.0)
MCHC: 32.1 g/dL (ref 30.0–36.0)
Monocytes Relative: 6 % (ref 3–12)
Neutrophils Relative %: 80 % — ABNORMAL HIGH (ref 43–77)
Platelets: 245 10*3/uL (ref 150–400)
RDW: 14.3 % (ref 11.5–15.5)

## 2013-04-05 LAB — URINE MICROSCOPIC-ADD ON

## 2013-04-05 MED ORDER — LEVOFLOXACIN 750 MG PO TABS: 750.0000 mg | ORAL_TABLET | Freq: Every day | ORAL | Status: DC

## 2013-04-05 MED ORDER — ALBUTEROL SULFATE (5 MG/ML) 0.5% IN NEBU
INHALATION_SOLUTION | RESPIRATORY_TRACT | Status: AC
  Administered 2013-04-05: 2.5 mg via RESPIRATORY_TRACT
  Filled 2013-04-05: qty 0.5

## 2013-04-05 MED ORDER — IPRATROPIUM BROMIDE 0.02 % IN SOLN
0.5000 mg | Freq: Once | RESPIRATORY_TRACT | Status: AC
  Administered 2013-04-05: 0.5 mg via RESPIRATORY_TRACT

## 2013-04-05 MED ORDER — IPRATROPIUM BROMIDE 0.02 % IN SOLN
RESPIRATORY_TRACT | Status: AC
  Administered 2013-04-05: 0.5 mg via RESPIRATORY_TRACT
  Filled 2013-04-05: qty 2.5

## 2013-04-05 MED ORDER — ALBUTEROL SULFATE (5 MG/ML) 0.5% IN NEBU
2.5000 mg | INHALATION_SOLUTION | Freq: Once | RESPIRATORY_TRACT | Status: AC
  Administered 2013-04-05: 2.5 mg via RESPIRATORY_TRACT

## 2013-04-05 MED ORDER — ACETAMINOPHEN 500 MG PO TABS
1000.0000 mg | ORAL_TABLET | Freq: Once | ORAL | Status: AC
  Administered 2013-04-05: 1000 mg via ORAL
  Filled 2013-04-05: qty 2

## 2013-04-05 MED ORDER — ONDANSETRON 8 MG PO TBDP
8.0000 mg | ORAL_TABLET | Freq: Once | ORAL | Status: AC
  Administered 2013-04-05: 8 mg via ORAL
  Filled 2013-04-05: qty 1

## 2013-04-05 NOTE — MAU Provider Note (Signed)
History     CSN: [id number]  Arrival date and time: 04/05/13 1244   First Provider Initiated Contact with Patient 04/05/13 1313      Chief Complaint  Patient presents with  . Shortness of Breath  . Nausea  . Fever  . Chills   HPI 32 year old G3 P1113 presents with one-day history of shortness of breath, fever/chills, nausea, sore throat. She has cough productive of thick mucoid sputum. She has bilateral low-back pain and generalized achiness. She describes pleuritic substernal chest pain and feeling like she can't get a full deep breath. Feels like tonsils swollen and has pain with swallowing, difficulty swallowing.  She tried albuterol x3 without relief today. She last took Tylenol at 4  Am.   Shortness of Breath Associated symptoms include chest pain, a fever, headaches, orthopnea, a sore throat and sputum production. Pertinent negatives include no abdominal pain, hemoptysis, leg swelling, neck pain, PND, rash, vomiting or wheezing.  Fever  Associated symptoms include chest pain, coughing, headaches, nausea and a sore throat. Pertinent negatives include no abdominal pain, rash, vomiting or wheezing.     Past Medical History  Diagnosis Date  . Gestational diabetes   . Morbid obesity   . Asthma   . Sleep apnea   . Hypertension     not consistant  . Urinary tract infection   . Fibroid   . Abnormal Pap smear     f/u wnl    Past Surgical History  Procedure Laterality Date  . Dilation and curettage of uterus    . Cesarean section    . Tubal ligation      Family History  Problem Relation Age of Onset  . Allergies Mother   . Asthma Mother   . Hypertension Mother   . Diabetes Mother   . Anesthesia problems Neg Hx   . Hypotension Neg Hx   . Malignant hyperthermia Neg Hx   . Pseudochol deficiency Neg Hx   . Hearing loss Neg Hx   . Diabetes Father   . Diabetes Maternal Grandmother   . Heart disease Maternal Grandmother     History  Substance Use Topics  .  Smoking status: Former Games developer  . Smokeless tobacco: Never Used  . Alcohol Use: No    Allergies:  Allergies  Allergen Reactions  . Other Anaphylaxis    Pt has fish allergy  . Penicillins Rash    Told MD that PCN reaction is rash and that she has taken amoxicillin before    Prescriptions prior to admission  Medication Sig Dispense Refill  . albuterol (PROVENTIL HFA;VENTOLIN HFA) 108 (90 BASE) MCG/ACT inhaler Inhale 2 puffs into the lungs every 6 (six) hours as needed. Asthma/SOB      . sitaGLIPtin-metformin (JANUMET) 50-500 MG per tablet Take 1 tablet by mouth 2 (two) times daily with a meal.      . budesonide-formoterol (SYMBICORT) 160-4.5 MCG/ACT inhaler Inhale 2 puffs into the lungs 2 (two) times daily.  1 Inhaler  12  . fluticasone (FLONASE) 50 MCG/ACT nasal spray Place 2 sprays into the nose daily.  1 g  0  . loratadine (CLARITIN) 10 MG tablet Take 1 tablet (10 mg total) by mouth daily.  30 tablet  0    Review of Systems  Constitutional: Positive for fever, chills and malaise/fatigue.  HENT: Positive for sore throat. Negative for neck pain.   Respiratory: Positive for cough, sputum production and shortness of breath. Negative for hemoptysis and wheezing.   Cardiovascular: Positive for  chest pain and orthopnea. Negative for palpitations, leg swelling and PND.  Gastrointestinal: Positive for nausea. Negative for vomiting and abdominal pain.  Genitourinary: Positive for flank pain. Negative for dysuria, urgency, frequency and hematuria.  Musculoskeletal: Positive for myalgias and back pain.  Skin: Negative for rash.  Neurological: Positive for weakness and headaches. Negative for dizziness.  Psychiatric/Behavioral: The patient is not nervous/anxious.    Physical Exam   Blood pressure 127/76, pulse 106, temperature 101.6 F (38.7 C), temperature source Oral, resp. rate 18, SpO2 100.00%. Filed Vitals:   04/05/13 1312 04/05/13 1332 04/05/13 1423  BP: 127/76  147/67  Pulse: 106   97  Temp: 101.6 F (38.7 C)  102.2 F (39 C)  TempSrc: Oral  Oral  Resp: 18  20  SpO2: 100% 100%    Physical Exam  Constitutional: She is oriented to person, place, and time. She appears distressed.  Morbidly obese, looks uncomfortable, anxious  HENT:  Head: Normocephalic.  Tonsils 2-3+ rt>left, sl redness, no exudates. No neck lympadenopathy appreciated but TTP over ant cervical nodes   Eyes: Conjunctivae are normal. Pupils are equal, round, and reactive to light.  Neck: Normal range of motion.  Respiratory: No stridor. She is in respiratory distress.  Mild distress; pleuritic CP  GI: Soft. There is no tenderness. There is no rebound and no guarding.  Genitourinary:  Mild  L and R CVAT  Musculoskeletal: Normal range of motion.  Rt and left L-S paraspinous area TTP  Lymphadenopathy:    She has no cervical adenopathy.  Neurological: She is alert and oriented to person, place, and time.  Skin: Skin is warm and dry.  Psychiatric: She has a normal mood and affect. Her behavior is normal. Judgment and thought content normal.    MAU Course  Procedures Results for orders placed during the hospital encounter of 04/05/13 (from the past 24 hour(s))  RAPID STREP SCREEN     Status: Abnormal   Collection Time    04/05/13  1:40 PM      Result Value Range   Streptococcus, Group A Screen (Direct) POSITIVE (*) NEGATIVE  URINALYSIS, ROUTINE W REFLEX MICROSCOPIC     Status: Abnormal   Collection Time    04/05/13  2:15 PM      Result Value Range   Color, Urine YELLOW  YELLOW   APPearance CLEAR  CLEAR   Specific Gravity, Urine 1.020  1.005 - 1.030   pH 8.5 (*) 5.0 - 8.0   Glucose, UA NEGATIVE  NEGATIVE mg/dL   Hgb urine dipstick LARGE (*) NEGATIVE   Bilirubin Urine NEGATIVE  NEGATIVE   Ketones, ur NEGATIVE  NEGATIVE mg/dL   Protein, ur NEGATIVE  NEGATIVE mg/dL   Urobilinogen, UA 4.0 (*) 0.0 - 1.0 mg/dL   Nitrite NEGATIVE  NEGATIVE   Leukocytes, UA NEGATIVE  NEGATIVE  URINE  MICROSCOPIC-ADD ON     Status: Abnormal   Collection Time    04/05/13  2:15 PM      Result Value Range   Squamous Epithelial / LPF FEW (*) RARE   WBC, UA 0-2  <3 WBC/hpf   RBC / HPF 11-20  <3 RBC/hpf   Bacteria, UA FEW (*) RARE  CBC WITH DIFFERENTIAL     Status: Abnormal   Collection Time    04/05/13  2:45 PM      Result Value Range   WBC 11.6 (*) 4.0 - 10.5 K/uL   RBC 4.08  3.87 - 5.11 MIL/uL   Hemoglobin 11.0 (*)  12.0 - 15.0 g/dL   HCT 16.1 (*) 09.6 - 04.5 %   MCV 84.1  78.0 - 100.0 fL   MCH 27.0  26.0 - 34.0 pg   MCHC 32.1  30.0 - 36.0 g/dL   RDW 40.9  81.1 - 91.4 %   Platelets 245  150 - 400 K/uL   Neutrophils Relative % 80 (*) 43 - 77 %   Neutro Abs 9.2 (*) 1.7 - 7.7 K/uL   Lymphocytes Relative 13  12 - 46 %   Lymphs Abs 1.5  0.7 - 4.0 K/uL   Monocytes Relative 6  3 - 12 %   Monocytes Absolute 0.7  0.1 - 1.0 K/uL   Eosinophils Relative 1  0 - 5 %   Eosinophils Absolute 0.2  0.0 - 0.7 K/uL   Basophils Relative 0  0 - 1 %   Basophils Absolute 0.0  0.0 - 0.1 K/uL  COMPREHENSIVE METABOLIC PANEL     Status: Abnormal   Collection Time    04/05/13  2:45 PM      Result Value Range   Sodium 135  135 - 145 mEq/L   Potassium 3.5  3.5 - 5.1 mEq/L   Chloride 99  96 - 112 mEq/L   CO2 27  19 - 32 mEq/L   Glucose, Bld 112 (*) 70 - 99 mg/dL   BUN 6  6 - 23 mg/dL   Creatinine, Ser 7.82  0.50 - 1.10 mg/dL   Calcium 9.2  8.4 - 95.6 mg/dL   Total Protein 7.0  6.0 - 8.3 g/dL   Albumin 3.3 (*) 3.5 - 5.2 g/dL   AST 10  0 - 37 U/L   ALT 14  0 - 35 U/L   Alkaline Phosphatase 67  39 - 117 U/L   Total Bilirubin 0.8  0.3 - 1.2 mg/dL   GFR calc non Af Amer >90  >90 mL/min   GFR calc Af Amer >90  >90 mL/min   Nasal canula O2 at 2 liters> sx improved Respiratory therapy here> albuterol nebs with minimal relief.   MDM With fever, tachycardia, CP and productive cough, suspect CAP with comorbidities DM, CHTN  and morbid obesity. Oxygen saturation and RR normal.   Assessment and Plan   Consulted with Dr. Erin Fulling  treat as early CAP GAS  pharyngitis> levaquin 750mg  po qd x5d (consulted with Sue Lush, Pharmacist, for single agent abx regimen for both) Advised patient to continue acetaminophen up to 4 g  per day for fever over 100.4, Increase fluids and rest. Return if not improved in 48 hours. AVS on  strep throat and pneumonia   Teela Narducci 04/05/2013, 1:35 PM

## 2013-04-05 NOTE — MAU Note (Signed)
Patient presents with c/o chills, shortness of breath (she states that she have used her albuterol inhaler 3x today, without relief), swollen tonsils. No wheezing auscultated. Breath sounds diminished in bases.

## 2013-04-05 NOTE — [facility] Provider Note (Signed)
Attestation of Attending Supervision of Advanced Practitioner (CNM/NP): Evaluation and management procedures were performed by the Advanced Practitioner under my supervision and collaboration.  I have reviewed the Advanced Practitioner's note and chart, and I agree with the management and plan.  HARRAWAY-SMITH, Hesham Womac 4:28 PM

## 2013-04-14 ENCOUNTER — Encounter (HOSPITAL_BASED_OUTPATIENT_CLINIC_OR_DEPARTMENT_OTHER): Payer: Self-pay | Admitting: *Deleted

## 2013-04-14 ENCOUNTER — Emergency Department (HOSPITAL_BASED_OUTPATIENT_CLINIC_OR_DEPARTMENT_OTHER)
Admission: EM | Admit: 2013-04-14 | Discharge: 2013-04-14 | Disposition: A | Discharge status: Home / Self Care | Payer: Managed Care, Other (non HMO) | Attending: Emergency Medicine | Admitting: Emergency Medicine

## 2013-04-14 ENCOUNTER — Emergency Department (HOSPITAL_BASED_OUTPATIENT_CLINIC_OR_DEPARTMENT_OTHER): Payer: Managed Care, Other (non HMO)

## 2013-04-14 DIAGNOSIS — Z87891 Personal history of nicotine dependence: Secondary | ICD-10-CM | POA: Insufficient documentation

## 2013-04-14 DIAGNOSIS — J351 Hypertrophy of tonsils: Secondary | ICD-10-CM | POA: Insufficient documentation

## 2013-04-14 DIAGNOSIS — R0982 Postnasal drip: Secondary | ICD-10-CM | POA: Insufficient documentation

## 2013-04-14 DIAGNOSIS — Z8632 Personal history of gestational diabetes: Secondary | ICD-10-CM | POA: Insufficient documentation

## 2013-04-14 DIAGNOSIS — Z792 Long term (current) use of antibiotics: Secondary | ICD-10-CM | POA: Insufficient documentation

## 2013-04-14 DIAGNOSIS — J45909 Unspecified asthma, uncomplicated: Secondary | ICD-10-CM | POA: Insufficient documentation

## 2013-04-14 DIAGNOSIS — Z8542 Personal history of malignant neoplasm of other parts of uterus: Secondary | ICD-10-CM | POA: Insufficient documentation

## 2013-04-14 DIAGNOSIS — J069 Acute upper respiratory infection, unspecified: Secondary | ICD-10-CM | POA: Insufficient documentation

## 2013-04-14 DIAGNOSIS — IMO0001 Reserved for inherently not codable concepts without codable children: Secondary | ICD-10-CM | POA: Insufficient documentation

## 2013-04-14 DIAGNOSIS — R5381 Other malaise: Secondary | ICD-10-CM | POA: Insufficient documentation

## 2013-04-14 DIAGNOSIS — Z87440 Personal history of urinary (tract) infections: Secondary | ICD-10-CM | POA: Insufficient documentation

## 2013-04-14 DIAGNOSIS — Z880 Allergy status to penicillin: Secondary | ICD-10-CM | POA: Insufficient documentation

## 2013-04-14 DIAGNOSIS — H9209 Otalgia, unspecified ear: Secondary | ICD-10-CM | POA: Insufficient documentation

## 2013-04-14 DIAGNOSIS — Z8701 Personal history of pneumonia (recurrent): Secondary | ICD-10-CM | POA: Insufficient documentation

## 2013-04-14 DIAGNOSIS — J3489 Other specified disorders of nose and nasal sinuses: Secondary | ICD-10-CM | POA: Insufficient documentation

## 2013-04-14 DIAGNOSIS — Z79899 Other long term (current) drug therapy: Secondary | ICD-10-CM | POA: Insufficient documentation

## 2013-04-14 DIAGNOSIS — I10 Essential (primary) hypertension: Secondary | ICD-10-CM | POA: Insufficient documentation

## 2013-04-14 DIAGNOSIS — Z8742 Personal history of other diseases of the female genital tract: Secondary | ICD-10-CM | POA: Insufficient documentation

## 2013-04-14 DIAGNOSIS — J329 Chronic sinusitis, unspecified: Secondary | ICD-10-CM | POA: Insufficient documentation

## 2013-04-14 LAB — RAPID STREP SCREEN (MED CTR MEBANE ONLY): Streptococcus, Group A Screen (Direct): POSITIVE — AB

## 2013-04-14 MED ORDER — IOHEXOL 300 MG/ML  SOLN
75.0000 mL | Freq: Once | INTRAMUSCULAR | Status: AC | PRN
  Administered 2013-04-14: 75 mL via INTRAVENOUS

## 2013-04-14 MED ORDER — MOMETASONE FUROATE 50 MCG/ACT NA SUSP: 2.0000 | Freq: Every day | NASAL | Status: DC

## 2013-04-14 MED ORDER — IBUPROFEN 600 MG PO TABS: 600.0000 mg | ORAL_TABLET | Freq: Four times a day (QID) | ORAL | Status: DC | PRN

## 2013-04-14 MED ORDER — AZITHROMYCIN 250 MG PO TABS: ORAL_TABLET | ORAL | Status: DC

## 2013-04-14 MED ORDER — OXYMETAZOLINE HCL 0.05 % NA SOLN: 2.0000 | Freq: Two times a day (BID) | NASAL | Status: DC

## 2013-04-14 MED ORDER — IBUPROFEN 400 MG PO TABS
600.0000 mg | ORAL_TABLET | Freq: Once | ORAL | Status: AC
  Administered 2013-04-14: 600 mg via ORAL
  Filled 2013-04-14: qty 1

## 2013-04-14 MED ORDER — DEXAMETHASONE SODIUM PHOSPHATE 10 MG/ML IJ SOLN
10.0000 mg | Freq: Once | INTRAMUSCULAR | Status: AC
  Administered 2013-04-14: 10 mg via INTRAMUSCULAR
  Filled 2013-04-14: qty 1

## 2013-04-14 NOTE — ED Provider Notes (Signed)
CSN: [id number]     Arrival date & time 04/14/13  1729 History   First MD Initiated Contact with Patient 04/14/13 1734     Chief Complaint  Patient presents with  . Sore Throat   (Consider location/radiation/quality/duration/timing/severity/associated sxs/prior Treatment) HPI Patient presents with almost 2 weeks worth of URI type symptoms. States she was seen by her primary doctor the beginning of the month and treated for early pneumonia/strep throat. She was given 5 days of Levaquin which she states she took completely. Patient continues to have nasal congestion, sinus pressure, post nasal drip, sore throat, enlarged tonsils and right greater than left ear pain. She states her voice has changed. She states her kids are in day care and have been sick with similar illnesses. She denies fevers or chills. She denies cough. She denies shortness of breath. She denies abdominal pain. Denies nausea vomiting. Denies neck stiffness. She denies focal weakness or numbness.  Past Medical History  Diagnosis Date  . Gestational diabetes   . Morbid obesity   . Asthma   . Sleep apnea   . Hypertension     not consistant  . Urinary tract infection   . Fibroid   . Abnormal Pap smear     f/u wnl   Past Surgical History  Procedure Laterality Date  . Dilation and curettage of uterus    . Cesarean section    . Tubal ligation     Family History  Problem Relation Age of Onset  . Allergies Mother   . Asthma Mother   . Hypertension Mother   . Diabetes Mother   . Anesthesia problems Neg Hx   . Hypotension Neg Hx   . Malignant hyperthermia Neg Hx   . Pseudochol deficiency Neg Hx   . Hearing loss Neg Hx   . Diabetes Father   . Diabetes Maternal Grandmother   . Heart disease Maternal Grandmother    History  Substance Use Topics  . Smoking status: Former Games developer  . Smokeless tobacco: Never Used  . Alcohol Use: No   OB History   Grav Para Term Preterm Abortions TAB SAB Ect Mult Living   3 1 1  0 2  0 2 0 0 2     Review of Systems  Constitutional: Positive for fatigue. Negative for fever and chills.  HENT: Positive for ear pain, congestion, sore throat, voice change, postnasal drip and sinus pressure. Negative for hearing loss, mouth sores, trouble swallowing, neck pain, neck stiffness, tinnitus and ear discharge.   Respiratory: Negative for cough, shortness of breath and wheezing.   Cardiovascular: Negative for chest pain, palpitations and leg swelling.  Gastrointestinal: Negative for nausea, vomiting and abdominal pain.  Musculoskeletal: Positive for myalgias.  Skin: Negative for rash.  Neurological: Negative for dizziness, weakness, numbness and headaches.  All other systems reviewed and are negative.    Allergies  Other and Penicillins  Home Medications   Current Outpatient Rx  Name  Route  Sig  Dispense  Refill  . albuterol (PROVENTIL HFA;VENTOLIN HFA) 108 (90 BASE) MCG/ACT inhaler   Inhalation   Inhale 2 puffs into the lungs every 6 (six) hours as needed. Asthma/SOB         . EXPIRED: budesonide-formoterol (SYMBICORT) 160-4.5 MCG/ACT inhaler   Inhalation   Inhale 2 puffs into the lungs 2 (two) times daily.   1 Inhaler   12   . EXPIRED: fluticasone (FLONASE) 50 MCG/ACT nasal spray   Nasal   Place 2 sprays into the  nose daily.   1 g   0   . levofloxacin (LEVAQUIN) 750 MG tablet   Oral   Take 1 tablet (750 mg total) by mouth daily.   5 tablet   0   . EXPIRED: loratadine (CLARITIN) 10 MG tablet   Oral   Take 1 tablet (10 mg total) by mouth daily.   30 tablet   0   . sitaGLIPtin-metformin (JANUMET) 50-500 MG per tablet   Oral   Take 1 tablet by mouth 2 (two) times daily with a meal.          BP 161/71  Pulse 98  Temp(Src) 99.3 F (37.4 C) (Oral)  Resp 20  Ht 5\' 6"  (1.676 m)  Wt 425 lb (192.779 kg)  BMI 68.63 kg/m2  SpO2 99% Physical Exam  Nursing note and vitals reviewed. Constitutional: She is oriented to person, place, and time. She  appears well-developed and well-nourished. No distress.  HENT:  Head: Normocephalic and atraumatic.  Mouth/Throat: Oropharynx is clear and moist. No oropharyngeal exudate.  Patient has bilateral enlarged tonsils with mild erythema no exudates. She has generalized frontal and maxillary sinus tenderness to palpation. She is edematous nasal turbinates. She has mild right greater than left TM erythema.   Eyes: EOM are normal. Pupils are equal, round, and reactive to light.  Neck: Normal range of motion. Neck supple.  No nuchal rigidity.  Cardiovascular: Normal rate and regular rhythm.   Pulmonary/Chest: Effort normal and breath sounds normal. No respiratory distress. She has no wheezes. She has no rales.  Abdominal: Soft. Bowel sounds are normal. She exhibits no distension and no mass. There is no tenderness. There is no rebound and no guarding.  Musculoskeletal: Normal range of motion. She exhibits no edema and no tenderness.  Neurological: She is alert and oriented to person, place, and time.  Patient is alert and oriented x3 with mildly muffled, goal oriented speech. Patient has 5/5 motor in all extremities. Sensation is intact to light touch. Bilateral finger-to-nose is normal with no signs of dysmetria. Patient has a normal gait and walks without assistance.   Skin: Skin is warm and dry. No rash noted. No erythema.  Psychiatric: She has a normal mood and affect. Her behavior is normal.    ED Course  Procedures (including critical care time) Labs Review Labs Reviewed  RAPID STREP SCREEN   Imaging Review No results found.  MDM  Suspect patient has a viral illness/sinusitis. Strep screen repeated. Patient has no airway compromise  Strep screen is positive. Given a short course of antibiotics, voice changes, and persistently positive strep screen, will get a CT neck to rule out PTA, posterior pharyngeal abscess. Patient given Decadron and anti-inflammatories in the emergency department.  We'll follow closely.  CT shows no abscesses. She says she is feeling much better. Advised her to return for worsening pain, fever, difficulty breathing, further voice changes, or any concerns.  Loren Racer, MD 04/14/13 [phone]

## 2013-04-14 NOTE — ED Notes (Signed)
MD at bedside. 

## 2013-04-14 NOTE — ED Notes (Signed)
Expressed to family that the 3 babies should probably wait in waiting room with father and not in the back with the sickness in the ED.  Pt. Father is with all children in lobby.

## 2013-04-14 NOTE — ED Notes (Signed)
Sore throat. Swollen right tonsil.

## 2013-09-12 ENCOUNTER — Emergency Department (HOSPITAL_BASED_OUTPATIENT_CLINIC_OR_DEPARTMENT_OTHER)
Admission: EM | Admit: 2013-09-12 | Discharge: 2013-09-12 | Disposition: A | Discharge status: Home / Self Care | Payer: Managed Care, Other (non HMO) | Attending: Emergency Medicine | Admitting: Emergency Medicine

## 2013-09-12 ENCOUNTER — Encounter (HOSPITAL_BASED_OUTPATIENT_CLINIC_OR_DEPARTMENT_OTHER): Payer: Self-pay | Admitting: Emergency Medicine

## 2013-09-12 ENCOUNTER — Emergency Department (HOSPITAL_BASED_OUTPATIENT_CLINIC_OR_DEPARTMENT_OTHER): Payer: Managed Care, Other (non HMO)

## 2013-09-12 DIAGNOSIS — J329 Chronic sinusitis, unspecified: Secondary | ICD-10-CM | POA: Insufficient documentation

## 2013-09-12 DIAGNOSIS — I10 Essential (primary) hypertension: Secondary | ICD-10-CM | POA: Insufficient documentation

## 2013-09-12 DIAGNOSIS — Z87891 Personal history of nicotine dependence: Secondary | ICD-10-CM | POA: Insufficient documentation

## 2013-09-12 DIAGNOSIS — Z87440 Personal history of urinary (tract) infections: Secondary | ICD-10-CM | POA: Insufficient documentation

## 2013-09-12 DIAGNOSIS — H9209 Otalgia, unspecified ear: Secondary | ICD-10-CM | POA: Insufficient documentation

## 2013-09-12 DIAGNOSIS — Z880 Allergy status to penicillin: Secondary | ICD-10-CM | POA: Insufficient documentation

## 2013-09-12 DIAGNOSIS — Z8632 Personal history of gestational diabetes: Secondary | ICD-10-CM | POA: Insufficient documentation

## 2013-09-12 DIAGNOSIS — J45901 Unspecified asthma with (acute) exacerbation: Secondary | ICD-10-CM | POA: Insufficient documentation

## 2013-09-12 DIAGNOSIS — IMO0002 Reserved for concepts with insufficient information to code with codable children: Secondary | ICD-10-CM | POA: Insufficient documentation

## 2013-09-12 DIAGNOSIS — Z8742 Personal history of other diseases of the female genital tract: Secondary | ICD-10-CM | POA: Insufficient documentation

## 2013-09-12 DIAGNOSIS — Z794 Long term (current) use of insulin: Secondary | ICD-10-CM | POA: Insufficient documentation

## 2013-09-12 DIAGNOSIS — Z79899 Other long term (current) drug therapy: Secondary | ICD-10-CM | POA: Insufficient documentation

## 2013-09-12 MED ORDER — AZITHROMYCIN 250 MG PO TABS: 250.0000 mg | ORAL_TABLET | Freq: Every day | ORAL | Status: DC

## 2013-09-12 MED ORDER — LEVOFLOXACIN 750 MG PO TABS: 750.0000 mg | ORAL_TABLET | Freq: Every day | ORAL | Status: DC

## 2013-09-12 NOTE — Discharge Instructions (Signed)
Sinus Headache A sinus headache happens when your sinuses become clogged or puffy (swollen). Sinus headaches can be mild or severe. HOME CARE  Take your medicines (antibiotics) as told. Finish them even if you start to feel better.  Only take medicine as told by your doctor.  Use a nose spray if you feel stuffed up (congested). GET HELP RIGHT AWAY IF:  You have a fever.  You have trouble seeing.  You suddenly have pain in your face or head.  You start to twitch or shake (seizure).  You are confused.  You get headaches more than once a week.  Light or sound bothers you.  You feel sick to your stomach (nauseous) or throw up (vomit).  Your headaches do not get better with treatment. MAKE SURE YOU:  Understand these instructions.  Will watch your condition.  Will get help right away if you are not doing well or get worse. Document Released: 11/20/2010 Document Revised: 10/13/2011 Document Reviewed: 11/20/2010 [facility] Patient Information 2014 [place], [place].

## 2013-09-12 NOTE — ED Provider Notes (Signed)
CSN: [id number]     Arrival date & time 09/12/13  1555 History   First MD Initiated Contact with Patient 09/12/13 1619     Chief Complaint  Patient presents with  . Nasal Congestion  . Otalgia  . Shortness of Breath     (Consider location/radiation/quality/duration/timing/severity/associated sxs/prior Treatment) HPI Comments: Pt states for the last week she had had cough, congestion, sore throat and some sob:has used her inhaler but no more then normal;denies fever:hasn't tried any otc medication  The history is provided by the patient. No language interpreter was used.    Past Medical History  Diagnosis Date  . Gestational diabetes   . Morbid obesity   . Asthma   . Sleep apnea   . Hypertension     not consistant  . Urinary tract infection   . Fibroid   . Abnormal Pap smear     f/u wnl   Past Surgical History  Procedure Laterality Date  . Dilation and curettage of uterus    . Cesarean section    . Tubal ligation     Family History  Problem Relation Age of Onset  . Allergies Mother   . Asthma Mother   . Hypertension Mother   . Diabetes Mother   . Anesthesia problems Neg Hx   . Hypotension Neg Hx   . Malignant hyperthermia Neg Hx   . Pseudochol deficiency Neg Hx   . Hearing loss Neg Hx   . Diabetes Father   . Diabetes Maternal Grandmother   . Heart disease Maternal Grandmother    History  Substance Use Topics  . Smoking status: Former Research scientist (life sciences)  . Smokeless tobacco: Never Used  . Alcohol Use: No   OB History   Grav Para Term Preterm Abortions TAB SAB Ect Mult Living   3 1 1  0 2 0 2 0 0 2     Review of Systems  Constitutional: Negative for fever.  HENT: Positive for congestion and ear pain.   Respiratory: Negative.   Cardiovascular: Negative.       Allergies  Other and Penicillins  Home Medications   Current Outpatient Rx  Name  Route  Sig  Dispense  Refill  . insulin detemir (LEVEMIR) 100 UNIT/ML injection   Subcutaneous   Inject into the skin  at bedtime.         [name] albuterol (PROVENTIL HFA;VENTOLIN HFA) 108 (90 BASE) MCG/ACT inhaler   Inhalation   Inhale 2 puffs into the lungs every 6 (six) hours as needed. Asthma/SOB         . azithromycin (ZITHROMAX Z-PAK) 250 MG tablet      2 po day one, then 1 daily x 4 days   5 tablet   0   . azithromycin (ZITHROMAX) 250 MG tablet   Oral   Take 1 tablet (250 mg total) by mouth daily. Take first 2 tablets together, then 1 every day until finished.   6 tablet   0   . azithromycin (ZITHROMAX) 250 MG tablet   Oral   Take 1 tablet (250 mg total) by mouth daily. Take first 2 tablets together, then 1 every day until finished.   6 tablet   0   . EXPIRED: budesonide-formoterol (SYMBICORT) 160-4.5 MCG/ACT inhaler   Inhalation   Inhale 2 puffs into the lungs 2 (two) times daily.   1 Inhaler   12   . EXPIRED: fluticasone (FLONASE) 50 MCG/ACT nasal spray   Nasal   Place 2 sprays into  the nose daily.   1 g   0   . ibuprofen (ADVIL,MOTRIN) 600 MG tablet   Oral   Take 1 tablet (600 mg total) by mouth every 6 (six) hours as needed for pain.   30 tablet   0   . EXPIRED: loratadine (CLARITIN) 10 MG tablet   Oral   Take 1 tablet (10 mg total) by mouth daily.   30 tablet   0   . mometasone (NASONEX) 50 MCG/ACT nasal spray   Nasal   Place 2 sprays into the nose daily.   17 g   12   . oxymetazoline (AFRIN NASAL SPRAY) 0.05 % nasal spray   Nasal   Place 2 sprays into the nose 2 (two) times daily.   30 mL   0   . sitaGLIPtin-metformin (JANUMET) 50-500 MG per tablet   Oral   Take 1 tablet by mouth 2 (two) times daily with a meal.          BP 143/78  Pulse 112  Temp(Src) 99.1 F (37.3 C) (Oral)  Resp 24  SpO2 96%  LMP 09/08/2013 Physical Exam  Nursing note and vitals reviewed. Constitutional: She appears well-developed and well-nourished.  HENT:  Right Ear: External ear normal.  Left Ear: External ear normal.  Nose: Rhinorrhea present. Right sinus exhibits  frontal sinus tenderness. Left sinus exhibits frontal sinus tenderness.  Mouth/Throat: Posterior oropharyngeal erythema present.  Cardiovascular: Normal rate and regular rhythm.   Pulmonary/Chest: Effort normal and breath sounds normal.    ED Course  Procedures (including critical care time) Labs Review Labs Reviewed - No data to display Imaging Review Dg Chest 2 View  09/12/2013   CLINICAL DATA:  Cough, shortness of Breath  EXAM: CHEST  2 VIEW  COMPARISON:  04/05/2013  FINDINGS: Cardiomediastinal silhouette is stable. No acute infiltrate or pulmonary edema. There is minimal compression deformity mid thoracic spine. Clinical correlation is necessary.  IMPRESSION: No active cardiopulmonary disease. Question minimal compression deformity mid thoracic spine. Clinical correlation is necessary.   Electronically Signed   By: Lahoma Crocker M.D.   On: 09/12/2013 16:42    EKG Interpretation   None       MDM   Final diagnoses:  Sinusitis    Will treat for sinusitis;no pneumonia noted on x-ray:no clinical finding consistent with compression injury    Glendell Docker, NP 09/12/13 1724

## 2013-09-12 NOTE — ED Notes (Signed)
Pt c/o nasal congestion, ears "stopped up", laryngitis and pain in chest with shortness of breath x 2-3 days.

## 2013-09-17 NOTE — ED Provider Notes (Signed)
Medical screening examination/treatment/procedure(s) were performed by non-physician practitioner and as supervising physician I was immediately available for consultation/collaboration.  EKG Interpretation   None         Tanna Furry, MD 09/17/13 [phone]

## 2013-12-03 ENCOUNTER — Encounter (HOSPITAL_BASED_OUTPATIENT_CLINIC_OR_DEPARTMENT_OTHER): Payer: Self-pay | Admitting: Emergency Medicine

## 2013-12-03 ENCOUNTER — Emergency Department (HOSPITAL_BASED_OUTPATIENT_CLINIC_OR_DEPARTMENT_OTHER)
Admission: EM | Admit: 2013-12-03 | Discharge: 2013-12-03 | Disposition: A | Discharge status: Home / Self Care | Payer: Managed Care, Other (non HMO) | Attending: Emergency Medicine | Admitting: Emergency Medicine

## 2013-12-03 DIAGNOSIS — Z87891 Personal history of nicotine dependence: Secondary | ICD-10-CM | POA: Insufficient documentation

## 2013-12-03 DIAGNOSIS — L03211 Cellulitis of face: Principal | ICD-10-CM

## 2013-12-03 DIAGNOSIS — J45909 Unspecified asthma, uncomplicated: Secondary | ICD-10-CM | POA: Insufficient documentation

## 2013-12-03 DIAGNOSIS — Z79899 Other long term (current) drug therapy: Secondary | ICD-10-CM | POA: Insufficient documentation

## 2013-12-03 DIAGNOSIS — I10 Essential (primary) hypertension: Secondary | ICD-10-CM | POA: Insufficient documentation

## 2013-12-03 DIAGNOSIS — Z87440 Personal history of urinary (tract) infections: Secondary | ICD-10-CM | POA: Insufficient documentation

## 2013-12-03 DIAGNOSIS — Z8632 Personal history of gestational diabetes: Secondary | ICD-10-CM | POA: Insufficient documentation

## 2013-12-03 DIAGNOSIS — Z880 Allergy status to penicillin: Secondary | ICD-10-CM | POA: Insufficient documentation

## 2013-12-03 DIAGNOSIS — IMO0002 Reserved for concepts with insufficient information to code with codable children: Secondary | ICD-10-CM | POA: Insufficient documentation

## 2013-12-03 DIAGNOSIS — L0201 Cutaneous abscess of face: Secondary | ICD-10-CM | POA: Insufficient documentation

## 2013-12-03 MED ORDER — SULFAMETHOXAZOLE-TRIMETHOPRIM 800-160 MG PO TABS: 1.0000 | ORAL_TABLET | Freq: Two times a day (BID) | ORAL | Status: DC

## 2013-12-03 MED ORDER — HYDROCODONE-ACETAMINOPHEN 5-325 MG PO TABS: 2.0000 | ORAL_TABLET | ORAL | Status: DC | PRN

## 2013-12-03 NOTE — ED Provider Notes (Signed)
Medical screening examination/treatment/procedure(s) were performed by non-physician practitioner and as supervising physician I was immediately available for consultation/collaboration.   EKG Interpretation None        Delice Bison Lounette Sloan, DO 12/03/13 1344

## 2013-12-03 NOTE — ED Provider Notes (Signed)
CSN: [id number]     Arrival date & time 12/03/13  1236 History   First MD Initiated Contact with Patient 12/03/13 1303     Chief Complaint  Patient presents with  . Abscess     (Consider location/radiation/quality/duration/timing/severity/associated sxs/prior Treatment) Patient is a 33 y.o. female presenting with abscess. The history is provided by the patient. No language interpreter was used.  Abscess Location:  Face Facial abscess location:  Face Size:  2 Abscess quality: draining, painful, redness and warmth   Red streaking: no   Progression:  Worsening Pain details:    Quality:  Aching   Severity:  Mild Chronicity:  New Relieved by:  Nothing Worsened by:  Nothing tried Ineffective treatments:  None tried Pt complains ot swelling to the right side of her face  Past Medical History  Diagnosis Date  . Gestational diabetes   . Morbid obesity   . Asthma   . Sleep apnea   . Hypertension     not consistant  . Urinary tract infection   . Fibroid   . Abnormal Pap smear     f/u wnl   Past Surgical History  Procedure Laterality Date  . Dilation and curettage of uterus    . Cesarean section    . Tubal ligation     Family History  Problem Relation Age of Onset  . Allergies Mother   . Asthma Mother   . Hypertension Mother   . Diabetes Mother   . Anesthesia problems Neg Hx   . Hypotension Neg Hx   . Malignant hyperthermia Neg Hx   . Pseudochol deficiency Neg Hx   . Hearing loss Neg Hx   . Diabetes Father   . Diabetes Maternal Grandmother   . Heart disease Maternal Grandmother    History  Substance Use Topics  . Smoking status: Former Research scientist (life sciences)  . Smokeless tobacco: Never Used  . Alcohol Use: No   OB History   Grav Para Term Preterm Abortions TAB SAB Ect Mult Living   3 1 1  0 2 0 2 0 0 2     Review of Systems  All other systems reviewed and are negative.     Allergies  Other and Penicillins  Home Medications   Prior to Admission medications    Medication Sig Start Date End Date Taking? Authorizing Provider  albuterol (PROVENTIL HFA;VENTOLIN HFA) 108 (90 BASE) MCG/ACT inhaler Inhale 2 puffs into the lungs every 6 (six) hours as needed. Asthma/SOB    Historical Provider, MD  budesonide-formoterol (SYMBICORT) 160-4.5 MCG/ACT inhaler Inhale 2 puffs into the lungs 2 (two) times daily. 10/03/11 10/02/12  West Pugh, NP  fluticasone (FLONASE) 50 MCG/ACT nasal spray Place 2 sprays into the nose daily. 03/14/12 03/14/13  Melony Overly, MD  ibuprofen (ADVIL,MOTRIN) 600 MG tablet Take 1 tablet (600 mg total) by mouth every 6 (six) hours as needed for pain. 04/14/13   Julianne Rice, MD  loratadine (CLARITIN) 10 MG tablet Take 1 tablet (10 mg total) by mouth daily. 03/14/12 03/14/13  Melony Overly, MD  mometasone (NASONEX) 50 MCG/ACT nasal spray Place 2 sprays into the nose daily. 04/14/13   Julianne Rice, MD  sitaGLIPtin-metformin (JANUMET) 50-500 MG per tablet Take 1 tablet by mouth 2 (two) times daily with a meal.    Historical Provider, MD   BP 171/94  Pulse 106  Temp(Src) 98.4 F (36.9 C) (Oral)  Resp 16  SpO2 98% Physical Exam  Nursing note and vitals reviewed. Constitutional: She  appears well-developed and well-nourished.  HENT:  Head: Normocephalic.  Right Ear: External ear normal.  Left Ear: External ear normal.  Nose: Nose normal.  Mouth/Throat: Oropharynx is clear and moist.  2cm abscess right chin  Eyes: Pupils are equal, round, and reactive to light.  Musculoskeletal: Normal range of motion.  Neurological: She is alert.  Skin: Skin is warm.    ED Course  INCISION AND DRAINAGE Date/Time: 12/03/2013 1:40 PM Performed by: Fransico Meadow Authorized by: Fransico Meadow Consent: Verbal consent not obtained. Risks and benefits: risks, benefits and alternatives were discussed Consent given by: patient Patient identity confirmed: verbally with patient Type: abscess Body area: head/neck Anesthesia: local infiltration Local  anesthetic: lidocaine 2% with epinephrine Scalpel size: 11 Incision type: single straight Complexity: simple Drainage: purulent Drainage amount: moderate Wound treatment: wound left open Packing material: Vaseline gauze   (including critical care time) Labs Review Labs Reviewed - No data to display  Imaging Review No results found.   EKG Interpretation None      MDM   Final diagnoses:  Abscess of face     BNactrim ds x 14 days   Fransico Meadow, [name] 12/03/13 1344

## 2013-12-03 NOTE — ED Notes (Signed)
Patient here with possible abscess to right side of chin x 2 days, attempted to drain with minimal relief. Warmth noted and firmness to chin

## 2013-12-03 NOTE — Discharge Instructions (Signed)

## 2014-05-01 ENCOUNTER — Encounter (HOSPITAL_COMMUNITY): Payer: Self-pay | Admitting: Emergency Medicine

## 2014-05-01 ENCOUNTER — Emergency Department (HOSPITAL_COMMUNITY)
Admission: EM | Admit: 2014-05-01 | Discharge: 2014-05-01 | Disposition: A | Discharge status: Home / Self Care | Payer: Managed Care, Other (non HMO) | Attending: Emergency Medicine | Admitting: Emergency Medicine

## 2014-05-01 DIAGNOSIS — S01512A Laceration without foreign body of oral cavity, initial encounter: Secondary | ICD-10-CM

## 2014-05-01 DIAGNOSIS — S0083XA Contusion of other part of head, initial encounter: Secondary | ICD-10-CM | POA: Diagnosis not present

## 2014-05-01 DIAGNOSIS — Z87440 Personal history of urinary (tract) infections: Secondary | ICD-10-CM | POA: Insufficient documentation

## 2014-05-01 DIAGNOSIS — S0003XA Contusion of scalp, initial encounter: Secondary | ICD-10-CM | POA: Insufficient documentation

## 2014-05-01 DIAGNOSIS — Z8632 Personal history of gestational diabetes: Secondary | ICD-10-CM | POA: Diagnosis not present

## 2014-05-01 DIAGNOSIS — Y9389 Activity, other specified: Secondary | ICD-10-CM | POA: Diagnosis not present

## 2014-05-01 DIAGNOSIS — Z880 Allergy status to penicillin: Secondary | ICD-10-CM | POA: Insufficient documentation

## 2014-05-01 DIAGNOSIS — S01502A Unspecified open wound of oral cavity, initial encounter: Secondary | ICD-10-CM | POA: Diagnosis not present

## 2014-05-01 DIAGNOSIS — Z87891 Personal history of nicotine dependence: Secondary | ICD-10-CM | POA: Diagnosis not present

## 2014-05-01 DIAGNOSIS — I10 Essential (primary) hypertension: Secondary | ICD-10-CM | POA: Diagnosis not present

## 2014-05-01 DIAGNOSIS — J45909 Unspecified asthma, uncomplicated: Secondary | ICD-10-CM | POA: Insufficient documentation

## 2014-05-01 DIAGNOSIS — S1093XA Contusion of unspecified part of neck, initial encounter: Principal | ICD-10-CM

## 2014-05-01 DIAGNOSIS — IMO0002 Reserved for concepts with insufficient information to code with codable children: Secondary | ICD-10-CM | POA: Diagnosis not present

## 2014-05-01 DIAGNOSIS — Z8742 Personal history of other diseases of the female genital tract: Secondary | ICD-10-CM | POA: Diagnosis not present

## 2014-05-01 DIAGNOSIS — S0990XA Unspecified injury of head, initial encounter: Secondary | ICD-10-CM | POA: Insufficient documentation

## 2014-05-01 DIAGNOSIS — Y92410 Unspecified street and highway as the place of occurrence of the external cause: Secondary | ICD-10-CM | POA: Diagnosis not present

## 2014-05-01 DIAGNOSIS — Z79899 Other long term (current) drug therapy: Secondary | ICD-10-CM | POA: Diagnosis not present

## 2014-05-01 MED ORDER — ACETAMINOPHEN 500 MG PO TABS
500.0000 mg | ORAL_TABLET | Freq: Once | ORAL | Status: AC
  Administered 2014-05-01: 500 mg via ORAL
  Filled 2014-05-01: qty 1
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
## 2014-05-02 ENCOUNTER — Encounter (HOSPITAL_BASED_OUTPATIENT_CLINIC_OR_DEPARTMENT_OTHER): Payer: Self-pay | Admitting: Emergency Medicine

## 2014-05-02 ENCOUNTER — Emergency Department (HOSPITAL_BASED_OUTPATIENT_CLINIC_OR_DEPARTMENT_OTHER)
Admission: EM | Admit: 2014-05-02 | Discharge: 2014-05-03 | Disposition: A | Discharge status: Home / Self Care | Payer: Managed Care, Other (non HMO) | Attending: Emergency Medicine | Admitting: Emergency Medicine

## 2014-05-02 ENCOUNTER — Emergency Department (HOSPITAL_BASED_OUTPATIENT_CLINIC_OR_DEPARTMENT_OTHER): Payer: Managed Care, Other (non HMO)

## 2014-05-02 DIAGNOSIS — I10 Essential (primary) hypertension: Secondary | ICD-10-CM | POA: Diagnosis not present

## 2014-05-02 DIAGNOSIS — R51 Headache: Secondary | ICD-10-CM | POA: Insufficient documentation

## 2014-05-02 DIAGNOSIS — IMO0002 Reserved for concepts with insufficient information to code with codable children: Secondary | ICD-10-CM | POA: Diagnosis not present

## 2014-05-02 DIAGNOSIS — Z791 Long term (current) use of non-steroidal anti-inflammatories (NSAID): Secondary | ICD-10-CM | POA: Insufficient documentation

## 2014-05-02 DIAGNOSIS — Z8632 Personal history of gestational diabetes: Secondary | ICD-10-CM | POA: Insufficient documentation

## 2014-05-02 DIAGNOSIS — Z87891 Personal history of nicotine dependence: Secondary | ICD-10-CM | POA: Insufficient documentation

## 2014-05-02 DIAGNOSIS — M542 Cervicalgia: Secondary | ICD-10-CM | POA: Diagnosis not present

## 2014-05-02 DIAGNOSIS — G8911 Acute pain due to trauma: Secondary | ICD-10-CM | POA: Insufficient documentation

## 2014-05-02 DIAGNOSIS — S0003XD Contusion of scalp, subsequent encounter: Secondary | ICD-10-CM

## 2014-05-02 DIAGNOSIS — J45909 Unspecified asthma, uncomplicated: Secondary | ICD-10-CM | POA: Diagnosis not present

## 2014-05-02 DIAGNOSIS — Z87448 Personal history of other diseases of urinary system: Secondary | ICD-10-CM | POA: Insufficient documentation

## 2014-05-02 DIAGNOSIS — Z79899 Other long term (current) drug therapy: Secondary | ICD-10-CM | POA: Diagnosis not present

## 2014-05-02 DIAGNOSIS — Z792 Long term (current) use of antibiotics: Secondary | ICD-10-CM | POA: Diagnosis not present

## 2014-05-02 DIAGNOSIS — Z87440 Personal history of urinary (tract) infections: Secondary | ICD-10-CM | POA: Insufficient documentation

## 2014-05-02 DIAGNOSIS — Z880 Allergy status to penicillin: Secondary | ICD-10-CM | POA: Diagnosis not present

## 2014-05-02 MED ORDER — OXYCODONE-ACETAMINOPHEN 5-325 MG PO TABS: 1.0000 | ORAL_TABLET | ORAL | Status: DC | PRN

## 2014-05-02 MED ORDER — ALBUTEROL SULFATE (2.5 MG/3ML) 0.083% IN NEBU
5.0000 mg | INHALATION_SOLUTION | Freq: Once | RESPIRATORY_TRACT | Status: AC
  Administered 2014-05-02: 5 mg via RESPIRATORY_TRACT
  Filled 2014-05-02: qty 6

## 2014-05-02 MED ORDER — HYDROCODONE-ACETAMINOPHEN 5-325 MG PO TABS
2.0000 | ORAL_TABLET | Freq: Once | ORAL | Status: AC
  Administered 2014-05-02: 2 via ORAL
  Filled 2014-05-02: qty 2
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
## 2014-05-02 NOTE — Progress Notes (Signed)
Patient states that she is breathing better after the nebulizer treatment.  Patient's BBS are improved with end expiratory wheezes on the left.
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
## 2014-05-20 ENCOUNTER — Emergency Department (HOSPITAL_COMMUNITY)
Admission: EM | Admit: 2014-05-20 | Discharge: 2014-05-20 | Disposition: A | Discharge status: Home / Self Care | Payer: Managed Care, Other (non HMO) | Attending: Emergency Medicine | Admitting: Emergency Medicine

## 2014-05-20 ENCOUNTER — Encounter (HOSPITAL_COMMUNITY): Payer: Self-pay | Admitting: Emergency Medicine

## 2014-05-20 DIAGNOSIS — Z87440 Personal history of urinary (tract) infections: Secondary | ICD-10-CM | POA: Insufficient documentation

## 2014-05-20 DIAGNOSIS — X58XXXA Exposure to other specified factors, initial encounter: Secondary | ICD-10-CM | POA: Diagnosis not present

## 2014-05-20 DIAGNOSIS — I10 Essential (primary) hypertension: Secondary | ICD-10-CM | POA: Diagnosis not present

## 2014-05-20 DIAGNOSIS — F419 Anxiety disorder, unspecified: Secondary | ICD-10-CM | POA: Insufficient documentation

## 2014-05-20 DIAGNOSIS — Z8742 Personal history of other diseases of the female genital tract: Secondary | ICD-10-CM | POA: Diagnosis not present

## 2014-05-20 DIAGNOSIS — Z7951 Long term (current) use of inhaled steroids: Secondary | ICD-10-CM | POA: Insufficient documentation

## 2014-05-20 DIAGNOSIS — R0602 Shortness of breath: Secondary | ICD-10-CM | POA: Insufficient documentation

## 2014-05-20 DIAGNOSIS — L299 Pruritus, unspecified: Secondary | ICD-10-CM | POA: Diagnosis not present

## 2014-05-20 DIAGNOSIS — Z880 Allergy status to penicillin: Secondary | ICD-10-CM | POA: Diagnosis not present

## 2014-05-20 DIAGNOSIS — Z79899 Other long term (current) drug therapy: Secondary | ICD-10-CM | POA: Diagnosis not present

## 2014-05-20 DIAGNOSIS — Z889 Allergy status to unspecified drugs, medicaments and biological substances status: Secondary | ICD-10-CM

## 2014-05-20 DIAGNOSIS — J45909 Unspecified asthma, uncomplicated: Secondary | ICD-10-CM | POA: Insufficient documentation

## 2014-05-20 DIAGNOSIS — T7840XA Allergy, unspecified, initial encounter: Secondary | ICD-10-CM | POA: Diagnosis not present

## 2014-05-20 DIAGNOSIS — Z792 Long term (current) use of antibiotics: Secondary | ICD-10-CM | POA: Insufficient documentation

## 2014-05-20 MED ORDER — PREDNISONE 20 MG PO TABS: 40.0000 mg | ORAL_TABLET | Freq: Every day | ORAL | Status: DC

## 2014-05-20 MED ORDER — PREDNISONE 20 MG PO TABS
60.0000 mg | ORAL_TABLET | Freq: Once | ORAL | Status: AC
  Administered 2014-05-20: 60 mg via ORAL
  Filled 2014-05-20: qty 3

## 2014-05-20 MED ORDER — EPINEPHRINE 0.3 MG/0.3ML IJ SOAJ: 0.3000 mg | Freq: Once | INTRAMUSCULAR | Status: DC | PRN
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
## 2014-05-20 NOTE — ED Provider Notes (Signed)
CSN: [id number]     Arrival date & time 05/20/14  1131 History   First MD Initiated Contact with Patient 05/20/14 1134     Chief Complaint  Patient presents with  . Allergic Reaction     (Consider location/radiation/quality/duration/timing/severity/associated sxs/prior Treatment) HPI Comments: The patient is a 33 year old female presenting to emergency room chief complaint of allergic reaction occurring this morning. The patient reports a pruritis to upper extremitites around 0930 after ingesting a wine cooler and ibuprofen and butter cookies. Patient reports taking son's full strength EpiPen, 50 mg of Benadryl for reaction. The patient reports she became anxious and had anxiety attack after became short of breath. She reports bilateral fingertip paresthesias, resolving. She reports she took her son's albuterol inhaler.  She reports resolution of symptoms prior to EMS arrival.  Patient is a 33 y.o. female presenting with allergic reaction. The history is provided by the patient. No language interpreter was used.  Allergic Reaction Presenting symptoms: no rash and no wheezing     Past Medical History  Diagnosis Date  . Gestational diabetes   . Morbid obesity   . Asthma   . Sleep apnea   . Hypertension     not consistant  . Urinary tract infection   . Fibroid   . Abnormal Pap smear     f/u wnl   Past Surgical History  Procedure Laterality Date  . Dilation and curettage of uterus    . Cesarean section    . Tubal ligation     Family History  Problem Relation Age of Onset  . Allergies Mother   . Asthma Mother   . Hypertension Mother   . Diabetes Mother   . Anesthesia problems Neg Hx   . Hypotension Neg Hx   . Malignant hyperthermia Neg Hx   . Pseudochol deficiency Neg Hx   . Hearing loss Neg Hx   . Diabetes Father   . Diabetes Maternal Grandmother   . Heart disease Maternal Grandmother    History  Substance Use Topics  . Smoking status: Former Research scientist (life sciences)  . Smokeless  tobacco: Never Used  . Alcohol Use: No   OB History   Grav Para Term Preterm Abortions TAB SAB Ect Mult Living   3 1 1  0 2 0 2 0 0 2     Review of Systems  Constitutional: Negative for fever and chills.  Respiratory: Positive for shortness of breath. Negative for wheezing.   Gastrointestinal: Negative for nausea, vomiting and abdominal pain.  Skin: Negative for rash.      Allergies  Other and Penicillins  Home Medications   Prior to Admission medications   Medication Sig Start Date End Date Taking? Authorizing Provider  albuterol (PROVENTIL HFA;VENTOLIN HFA) 108 (90 BASE) MCG/ACT inhaler Inhale 2-3 puffs into the lungs every 6 (six) hours as needed for wheezing. Asthma/SOB    Historical Provider, MD  budesonide-formoterol (SYMBICORT) 160-4.5 MCG/ACT inhaler Inhale 2 puffs into the lungs 2 (two) times daily. 10/03/11 05/01/14  West Pugh, NP  ibuprofen (ADVIL,MOTRIN) 600 MG tablet Take 1 tablet (600 mg total) by mouth every 6 (six) hours as needed for pain. 04/14/13   Julianne Rice, MD  loratadine (CLARITIN) 10 MG tablet Take 1 tablet (10 mg total) by mouth daily. 03/14/12 05/01/14  Melony Overly, MD  mometasone (NASONEX) 50 MCG/ACT nasal spray Place 2 sprays into the nose daily. 04/14/13   Julianne Rice, MD  oxyCODONE-acetaminophen (PERCOCET/ROXICET) 5-325 MG per tablet Take 1 tablet by mouth  every 4 (four) hours as needed for moderate pain or severe pain. 05/02/14   [name], MD  sitaGLIPtin-metformin (JANUMET) 50-500 MG per tablet Take 1 tablet by mouth 2 (two) times daily with a meal.    Historical Provider, MD  sulfamethoxazole-trimethoprim (SEPTRA DS) 800-160 MG per tablet Take 1 tablet by mouth every 12 (twelve) hours. 12/03/13   Fransico Meadow, [name]   BP 151/98  Pulse 89  Temp(Src) 97.8 F (36.6 C) (Oral)  Resp 20  Ht 5\' 6"  (1.676 m)  Wt 370 lb (167.831 kg)  BMI 59.75 kg/m2  SpO2 98%  LMP 04/25/2014 Physical Exam  Nursing note and vitals  reviewed. Constitutional: She is oriented to person, place, and time. She appears well-developed and well-nourished. No distress.  HENT:  Head: Normocephalic and atraumatic.  Mouth/Throat: Uvula is midline, oropharynx is clear and moist and mucous membranes are normal.  No angioedema.  Eyes: EOM are normal. Pupils are equal, round, and reactive to light.  Neck: Normal range of motion. Neck supple.  Cardiovascular: Normal rate and regular rhythm.   Pulmonary/Chest: Effort normal and breath sounds normal. No stridor. She has no wheezes. She has no rales.  Patient is able to speak in complete sentences.   Abdominal: Soft. Bowel sounds are normal. There is no tenderness. There is no rebound and no guarding.  Musculoskeletal: Normal range of motion.  Neurological: She is alert and oriented to person, place, and time.  Skin: Skin is warm and dry. No rash noted. Rash is not urticarial. She is not diaphoretic.  Psychiatric: She has a normal mood and affect. Her behavior is normal.    ED Course  Procedures (including critical care time) Labs Review Labs Reviewed - No data to display  Imaging Review No results found.   EKG Interpretation None      MDM   Final diagnoses:  Itching  History of allergy   Patient presents with was an allergic reaction patient gave EpiPen at home and 50 mg of Benadryl EMS gave 50 mg of Zantac. EMS reports without sign of systemic reaction on arrival. Patient is speaking in complete sentences, no wheezing no wheels patient is in no acute distress, no signs of systemic reaction. Will monitor for return of symptoms likely discharge home. Reevaluation patient resting comfortably in room requesting discharged home, no sign of rash for wheezing patient remains in no acute distress. Patient was monitored in emergency room in for 4 hours after self administering EpiPen.   Discussed lab results, imaging results, and treatment plan with the patient. Return precautions  given. Reports understanding and no other concerns at this time.  Patient is stable for discharge at this time.  Meds given in ED:  Medications  predniSONE (DELTASONE) tablet 60 mg (60 mg Oral Given 05/20/14 1323)    Discharge Medication List as of 05/20/2014  1:46 PM    START taking these medications   Details  predniSONE (DELTASONE) 20 MG tablet Take 2 tablets (40 mg total) by mouth daily. Take 40 mg by mouth daily for 3 days, then 20mg  by mouth daily for 3 days, then 10mg  daily for 3 days, Starting 05/20/2014, Until Discontinued, Print        Harvie Heck, [name] 05/21/14 1720

## 2014-05-20 NOTE — Discharge Instructions (Signed)
Call for a follow up appointment with a Family or Primary Care Provider.  Return if Symptoms worsen.   Take medication as prescribed.  Avoid drinking alcoholic beverage that caused you to itch.

## 2014-05-20 NOTE — ED Notes (Signed)
Pt wanting prescription for Epipen.  PA in to speak with pt.

## 2014-05-23 NOTE — Care Management Note (Signed)
    Page 1 of 1   05/21/2014     8:29:15 AM CARE MANAGEMENT NOTE 05/21/2014  Patient:  [name], [name]   Account Number:  [id number]  Date Initiated:  05/20/2014  Documentation initiated by:  [facility]  Subjective/Objective Assessment:   [id number]     Action/Plan:   PCP issues  med asst   Anticipated DC Date:  05/20/2014   Anticipated DC Plan:           Choice offered to / List presented to:             Status of service:  Completed, signed off Medicare Important Message given?   (If response is "NO", the following Medicare IM given date fields will be blank) Date Medicare IM given:   Medicare IM given by:   Date Additional Medicare IM given:   Additional Medicare IM given by:    Discharge Disposition:  HOME/SELF CARE  Per UR Regulation:    If discussed at Long Length of Stay Meetings, dates discussed:    Comments:  10/17 15 CM called to ED to provide a Resource number for pt to secure a new PCP.  Cm gave pt Resource number (handout) and gave pt [place] pamphlet.  CM also gave pt (per MD request) Xarelto free 30 day trial card and free 12 mos card as she will be prescribed Xarelto.  No other CM needs were communicated.  Mariane Masters, BSN, CM [phone].

## 2014-05-29 NOTE — ED Provider Notes (Signed)
Medical screening examination/treatment/procedure(s) were performed by non-physician practitioner and as supervising physician I was immediately available for consultation/collaboration.   EKG Interpretation None        Delice Bison Tyre Beaver, DO 05/29/14 1353

## 2014-06-05 ENCOUNTER — Encounter (HOSPITAL_COMMUNITY): Payer: Self-pay | Admitting: Emergency Medicine

## 2014-08-10 ENCOUNTER — Emergency Department (HOSPITAL_BASED_OUTPATIENT_CLINIC_OR_DEPARTMENT_OTHER)
Admission: EM | Admit: 2014-08-10 | Discharge: 2014-08-10 | Disposition: A | Discharge status: Home / Self Care | Payer: Managed Care, Other (non HMO) | Attending: Emergency Medicine | Admitting: Emergency Medicine

## 2014-08-10 ENCOUNTER — Encounter (HOSPITAL_BASED_OUTPATIENT_CLINIC_OR_DEPARTMENT_OTHER): Payer: Self-pay

## 2014-08-10 DIAGNOSIS — Z8669 Personal history of other diseases of the nervous system and sense organs: Secondary | ICD-10-CM | POA: Insufficient documentation

## 2014-08-10 DIAGNOSIS — Z79899 Other long term (current) drug therapy: Secondary | ICD-10-CM | POA: Insufficient documentation

## 2014-08-10 DIAGNOSIS — Z87440 Personal history of urinary (tract) infections: Secondary | ICD-10-CM | POA: Diagnosis not present

## 2014-08-10 DIAGNOSIS — Z8632 Personal history of gestational diabetes: Secondary | ICD-10-CM | POA: Insufficient documentation

## 2014-08-10 DIAGNOSIS — J45909 Unspecified asthma, uncomplicated: Secondary | ICD-10-CM | POA: Diagnosis not present

## 2014-08-10 DIAGNOSIS — Z8742 Personal history of other diseases of the female genital tract: Secondary | ICD-10-CM | POA: Insufficient documentation

## 2014-08-10 DIAGNOSIS — I10 Essential (primary) hypertension: Secondary | ICD-10-CM | POA: Insufficient documentation

## 2014-08-10 DIAGNOSIS — Z880 Allergy status to penicillin: Secondary | ICD-10-CM | POA: Diagnosis not present

## 2014-08-10 DIAGNOSIS — Z87891 Personal history of nicotine dependence: Secondary | ICD-10-CM | POA: Diagnosis not present

## 2014-08-10 DIAGNOSIS — N764 Abscess of vulva: Secondary | ICD-10-CM | POA: Diagnosis present

## 2014-08-10 MED ORDER — HYDROCODONE-ACETAMINOPHEN 5-325 MG PO TABS
2.0000 | ORAL_TABLET | Freq: Once | ORAL | Status: AC
  Administered 2014-08-10: 2 via ORAL
  Filled 2014-08-10: qty 2

## 2014-08-10 MED ORDER — CEFTRIAXONE SODIUM 1 G IJ SOLR
1.0000 g | Freq: Once | INTRAMUSCULAR | Status: AC
  Administered 2014-08-10: 1 g via INTRAMUSCULAR
  Filled 2014-08-10: qty 10

## 2014-08-10 MED ORDER — HYDROCODONE-ACETAMINOPHEN 5-325 MG PO TABS: 2.0000 | ORAL_TABLET | ORAL | Status: DC | PRN

## 2014-08-10 MED ORDER — SULFAMETHOXAZOLE-TRIMETHOPRIM 800-160 MG PO TABS: 1.0000 | ORAL_TABLET | Freq: Two times a day (BID) | ORAL | Status: AC

## 2014-08-10 MED ORDER — SULFAMETHOXAZOLE-TRIMETHOPRIM 800-160 MG PO TABS
1.0000 | ORAL_TABLET | Freq: Once | ORAL | Status: AC
  Administered 2014-08-10: 1 via ORAL
  Filled 2014-08-10: qty 1

## 2014-08-10 MED ORDER — STERILE WATER FOR INJECTION IJ SOLN
2.0000 mL | Freq: Once | INTRAMUSCULAR | Status: AC
  Administered 2014-08-10: 2 mL via INTRAMUSCULAR
  Filled 2014-08-10: qty 5

## 2014-08-10 NOTE — Discharge Instructions (Signed)

## 2014-08-10 NOTE — ED Provider Notes (Signed)
CSN: [id number]     Arrival date & time 08/10/14  2039 History   First MD Initiated Contact with Patient 08/10/14 2102     Chief Complaint  Patient presents with  . Abscess     (Consider location/radiation/quality/duration/timing/severity/associated sxs/prior Treatment) Patient is a 34 y.o. female presenting with abscess. The history is provided by the patient. No language interpreter was used.  Abscess Location:  Ano-genital Ano-genital abscess location:  Vulva Size:  3 Abscess quality: draining, painful, redness and warmth   Red streaking: no   Duration:  1 week Progression:  Worsening Pain details:    Quality:  Aching   Severity:  Severe   Timing:  Constant   Progression:  Worsening Chronicity:  New Relieved by:  Nothing Worsened by:  Nothing tried Ineffective treatments:  None tried Associated symptoms: no vomiting    Pt complains of an abscess in vaginal area.  Pt reports area is draining.  Pt is also on menses Past Medical History  Diagnosis Date  . Gestational diabetes   . Morbid obesity   . Asthma   . Sleep apnea   . Hypertension     not consistant  . Urinary tract infection   . Fibroid   . Abnormal Pap smear     f/u wnl   Past Surgical History  Procedure Laterality Date  . Dilation and curettage of uterus    . Cesarean section    . Tubal ligation     Family History  Problem Relation Age of Onset  . Allergies Mother   . Asthma Mother   . Hypertension Mother   . Diabetes Mother   . Anesthesia problems Neg Hx   . Hypotension Neg Hx   . Malignant hyperthermia Neg Hx   . Pseudochol deficiency Neg Hx   . Hearing loss Neg Hx   . Diabetes Father   . Diabetes Maternal Grandmother   . Heart disease Maternal Grandmother    History  Substance Use Topics  . Smoking status: Former Research scientist (life sciences)  . Smokeless tobacco: Never Used  . Alcohol Use: Yes     Comment: occasional   OB History    Gravida Para Term Preterm AB TAB SAB Ectopic Multiple Living   3 1 1  0  2 0 2 0 0 2     Review of Systems  Gastrointestinal: Negative for vomiting.  Genitourinary: Positive for vaginal pain.  Skin: Positive for wound.  All other systems reviewed and are negative.     Allergies  Other and Penicillins  Home Medications   Prior to Admission medications   Medication Sig Start Date End Date Taking? Authorizing Provider  albuterol (PROVENTIL HFA;VENTOLIN HFA) 108 (90 BASE) MCG/ACT inhaler Inhale 2-3 puffs into the lungs every 6 (six) hours as needed for wheezing. Asthma/SOB    Historical Provider, MD  budesonide-formoterol (SYMBICORT) 160-4.5 MCG/ACT inhaler Inhale 2 puffs into the lungs daily as needed (for shortness of breath).    Historical Provider, MD  EPINEPHrine (EPIPEN 2-PAK) 0.3 mg/0.3 mL IJ SOAJ injection Inject 0.3 mLs (0.3 mg total) into the muscle once as needed (for severe allergic reaction). CAll 911 immediately if you have to use this medicine 05/20/14   Harvie Heck, [name]  OVER THE COUNTER MEDICATION Place 2 drops into both eyes daily. "OTC Pure Eyes"    Historical Provider, MD  sitaGLIPtin-metformin (JANUMET) 50-500 MG per tablet Take 1 tablet by mouth 2 (two) times daily with a meal.    Historical Provider, MD  BP 169/98 mmHg  Pulse 98  Temp(Src) 98.5 F (36.9 C) (Oral)  Resp 20  Ht 5\' 6"  (1.676 m)  Wt 373 lb (169.192 kg)  BMI 60.23 kg/m2  SpO2 99%  LMP 08/10/2014 Physical Exam  Constitutional: She is oriented to person, place, and time. She appears well-developed and well-nourished.  HENT:  Head: Normocephalic.  Eyes: EOM are normal.  Neck: Normal range of motion.  Cardiovascular: Normal rate and normal heart sounds.   Pulmonary/Chest: Effort normal.  Abdominal: Soft. She exhibits no distension.  Genitourinary:  Swollen area right labia  And below labia,  Erythema, oozing, open area  Musculoskeletal: Normal range of motion.  Neurological: She is alert and oriented to person, place, and time.  Skin: There is erythema.   Psychiatric: She has a normal mood and affect.  Nursing note and vitals reviewed.   ED Course  Procedures (including critical care time) Labs Review Labs Reviewed - No data to display  Imaging Review No results found.   EKG Interpretation None      MDM  Pt is at base of labia into prerectal area,   Open and oozing.    Final diagnoses:  Labial abscess    Rocephin Bactrim Hydrocodone Pt advised return if worsening    Fransico Meadow, [name] 08/10/14 2132  Carmin Muskrat, MD 08/10/14 [phone]

## 2014-08-10 NOTE — ED Notes (Signed)
Abscess to vaginal area x 1 week

## 2014-12-15 ENCOUNTER — Emergency Department (HOSPITAL_BASED_OUTPATIENT_CLINIC_OR_DEPARTMENT_OTHER)
Admission: EM | Admit: 2014-12-15 | Discharge: 2014-12-16 | Disposition: A | Discharge status: Home / Self Care | Payer: Managed Care, Other (non HMO) | Attending: Emergency Medicine | Admitting: Emergency Medicine

## 2014-12-15 ENCOUNTER — Encounter (HOSPITAL_BASED_OUTPATIENT_CLINIC_OR_DEPARTMENT_OTHER): Payer: Self-pay | Admitting: *Deleted

## 2014-12-15 ENCOUNTER — Emergency Department (HOSPITAL_BASED_OUTPATIENT_CLINIC_OR_DEPARTMENT_OTHER): Payer: Managed Care, Other (non HMO)

## 2014-12-15 DIAGNOSIS — Z87891 Personal history of nicotine dependence: Secondary | ICD-10-CM | POA: Insufficient documentation

## 2014-12-15 DIAGNOSIS — Z880 Allergy status to penicillin: Secondary | ICD-10-CM | POA: Diagnosis not present

## 2014-12-15 DIAGNOSIS — Z79899 Other long term (current) drug therapy: Secondary | ICD-10-CM | POA: Diagnosis not present

## 2014-12-15 DIAGNOSIS — I10 Essential (primary) hypertension: Secondary | ICD-10-CM | POA: Insufficient documentation

## 2014-12-15 DIAGNOSIS — Z87440 Personal history of urinary (tract) infections: Secondary | ICD-10-CM | POA: Insufficient documentation

## 2014-12-15 DIAGNOSIS — R109 Unspecified abdominal pain: Secondary | ICD-10-CM | POA: Insufficient documentation

## 2014-12-15 DIAGNOSIS — J45909 Unspecified asthma, uncomplicated: Secondary | ICD-10-CM | POA: Diagnosis not present

## 2014-12-15 DIAGNOSIS — Z8632 Personal history of gestational diabetes: Secondary | ICD-10-CM | POA: Diagnosis not present

## 2014-12-15 DIAGNOSIS — Z86018 Personal history of other benign neoplasm: Secondary | ICD-10-CM | POA: Insufficient documentation

## 2014-12-15 DIAGNOSIS — Z9851 Tubal ligation status: Secondary | ICD-10-CM | POA: Insufficient documentation

## 2014-12-15 DIAGNOSIS — N12 Tubulo-interstitial nephritis, not specified as acute or chronic: Secondary | ICD-10-CM

## 2014-12-15 DIAGNOSIS — R300 Dysuria: Secondary | ICD-10-CM | POA: Diagnosis present

## 2014-12-15 DIAGNOSIS — Z8669 Personal history of other diseases of the nervous system and sense organs: Secondary | ICD-10-CM | POA: Diagnosis not present

## 2014-12-15 LAB — PREGNANCY, URINE: Preg Test, Ur: NEGATIVE

## 2014-12-15 LAB — URINE MICROSCOPIC-ADD ON

## 2014-12-15 LAB — URINALYSIS, ROUTINE W REFLEX MICROSCOPIC
Bilirubin Urine: NEGATIVE
Glucose, UA: NEGATIVE mg/dL
Hgb urine dipstick: NEGATIVE
Ketones, ur: NEGATIVE mg/dL
Nitrite: NEGATIVE
Protein, ur: NEGATIVE mg/dL
Specific Gravity, Urine: 1.018 (ref 1.005–1.030)
Urobilinogen, UA: 1 mg/dL (ref 0.0–1.0)
pH: 7 (ref 5.0–8.0)

## 2014-12-15 LAB — CBC WITH DIFFERENTIAL/PLATELET
Basophils Absolute: 0.1 10*3/uL (ref 0.0–0.1)
Basophils Relative: 1 % (ref 0–1)
Eosinophils Absolute: 0.4 10*3/uL (ref 0.0–0.7)
Eosinophils Relative: 5 % (ref 0–5)
HCT: 35.4 % — ABNORMAL LOW (ref 36.0–46.0)
Hemoglobin: 11.1 g/dL — ABNORMAL LOW (ref 12.0–15.0)
Lymphocytes Relative: 28 % (ref 12–46)
Lymphs Abs: 2.5 10*3/uL (ref 0.7–4.0)
MCH: 25.2 pg — ABNORMAL LOW (ref 26.0–34.0)
MCHC: 31.4 g/dL (ref 30.0–36.0)
MCV: 80.3 fL (ref 78.0–100.0)
Monocytes Absolute: 0.6 10*3/uL (ref 0.1–1.0)
Monocytes Relative: 7 % (ref 3–12)
Neutro Abs: 5.2 10*3/uL (ref 1.7–7.7)
Neutrophils Relative %: 59 % (ref 43–77)
Platelets: 359 10*3/uL (ref 150–400)
RBC: 4.41 MIL/uL (ref 3.87–5.11)
RDW: 15.6 % — ABNORMAL HIGH (ref 11.5–15.5)
WBC: 8.8 10*3/uL (ref 4.0–10.5)

## 2014-12-15 LAB — BASIC METABOLIC PANEL
Anion gap: 7 (ref 5–15)
BUN: 15 mg/dL (ref 6–20)
CO2: 26 mmol/L (ref 22–32)
Calcium: 9.3 mg/dL (ref 8.9–10.3)
Chloride: 104 mmol/L (ref 101–111)
Creatinine, Ser: 0.88 mg/dL (ref 0.44–1.00)
GFR calc Af Amer: >60 mL/min (ref >60)
GFR calc non Af Amer: >60 mL/min (ref >60)
Glucose, Bld: 134 mg/dL — ABNORMAL HIGH (ref 65–99)
Potassium: 4.1 mmol/L (ref 3.5–5.1)
Sodium: 137 mmol/L (ref 135–145)

## 2014-12-15 MED ORDER — HYDROMORPHONE HCL 1 MG/ML IJ SOLN
1.0000 mg | Freq: Once | INTRAMUSCULAR | Status: AC
  Administered 2014-12-15: 1 mg via INTRAVENOUS
  Filled 2014-12-15: qty 1

## 2014-12-15 MED ORDER — CIPROFLOXACIN IN D5W 400 MG/200ML IV SOLN
400.0000 mg | Freq: Once | INTRAVENOUS | Status: AC
  Administered 2014-12-15: 400 mg via INTRAVENOUS
  Filled 2014-12-15: qty 200

## 2014-12-15 MED ORDER — TRAMADOL HCL 50 MG PO TABS: 50.0000 mg | ORAL_TABLET | Freq: Four times a day (QID) | ORAL | Status: DC | PRN

## 2014-12-15 MED ORDER — CIPROFLOXACIN HCL 500 MG PO TABS: 500.0000 mg | ORAL_TABLET | Freq: Two times a day (BID) | ORAL | Status: DC

## 2014-12-15 MED ORDER — ONDANSETRON HCL 4 MG/2ML IJ SOLN
4.0000 mg | Freq: Once | INTRAMUSCULAR | Status: AC
  Administered 2014-12-15: 4 mg via INTRAVENOUS
  Filled 2014-12-15: qty 2

## 2014-12-15 MED ORDER — IOHEXOL 300 MG/ML  SOLN
50.0000 mL | Freq: Once | INTRAMUSCULAR | Status: AC | PRN
  Administered 2014-12-15: 50 mL via ORAL

## 2014-12-15 MED ORDER — IOHEXOL 300 MG/ML  SOLN
100.0000 mL | Freq: Once | INTRAMUSCULAR | Status: AC | PRN
  Administered 2014-12-15: 100 mL via INTRAVENOUS

## 2014-12-15 MED ORDER — KETOROLAC TROMETHAMINE 15 MG/ML IJ SOLN
15.0000 mg | Freq: Once | INTRAMUSCULAR | Status: AC
  Administered 2014-12-15: 15 mg via INTRAVENOUS
  Filled 2014-12-15: qty 1

## 2014-12-15 MED ORDER — SODIUM CHLORIDE 0.9 % IV BOLUS (SEPSIS)
1000.0000 mL | Freq: Once | INTRAVENOUS | Status: AC
  Administered 2014-12-15: 1000 mL via INTRAVENOUS

## 2014-12-15 NOTE — ED Provider Notes (Signed)
CSN: [id number]     Arrival date & time 12/15/14  1919 History  This chart was scribed for Virgel Manifold, MD by Julien Nordmann, ED Scribe. This patient was seen in room MH03/MH03 and the patient's care was started at 8:07 PM.    Chief Complaint  Patient presents with  . Dysuria   The history is provided by the patient. No language interpreter was used.     HPI Comments: [name] is a 34 y.o. female with prior medical history of UTI's who presents to the Emergency Department complaining of moderate pain with urination onset 2 days ago. Patient suspects she has a UTI. She has associated symptoms of constant, moderate left sided abdominal pain, nausea, and mild hematuria while wiping. She also notes she feels a "pressure" in the abdomen when urinating. She notes mild relief of abdominal pain when changing positions. Patient has prior surgical history of 2 c-sections. She denies fever, chills, vaginal bleeding, and vaginal discharge.    Past Medical History  Diagnosis Date  . Gestational diabetes   . Morbid obesity   . Asthma   . Sleep apnea   . Hypertension     not consistant  . Urinary tract infection   . Fibroid   . Abnormal Pap smear     f/u wnl   Past Surgical History  Procedure Laterality Date  . Dilation and curettage of uterus    . Cesarean section    . Tubal ligation     Family History  Problem Relation Age of Onset  . Allergies Mother   . Asthma Mother   . Hypertension Mother   . Diabetes Mother   . Anesthesia problems Neg Hx   . Hypotension Neg Hx   . Malignant hyperthermia Neg Hx   . Pseudochol deficiency Neg Hx   . Hearing loss Neg Hx   . Diabetes Father   . Diabetes Maternal Grandmother   . Heart disease Maternal Grandmother    History  Substance Use Topics  . Smoking status: Former Research scientist (life sciences)  . Smokeless tobacco: Never Used  . Alcohol Use: Yes     Comment: occasional   OB History    Gravida Para Term Preterm AB TAB SAB Ectopic Multiple Living   3  1 1  0 2 0 2 0 0 2     Review of Systems  Constitutional: Negative for fever and chills.  Gastrointestinal: Positive for nausea and abdominal pain.  Genitourinary: Positive for dysuria and hematuria (mild). Negative for vaginal bleeding and vaginal discharge.  All other systems reviewed and are negative.     Allergies  Other and Penicillins  Home Medications   Prior to Admission medications   Medication Sig Start Date End Date Taking? Authorizing Provider  albuterol (PROVENTIL HFA;VENTOLIN HFA) 108 (90 BASE) MCG/ACT inhaler Inhale 2-3 puffs into the lungs every 6 (six) hours as needed for wheezing. Asthma/SOB    Historical Provider, MD  budesonide-formoterol (SYMBICORT) 160-4.5 MCG/ACT inhaler Inhale 2 puffs into the lungs daily as needed (for shortness of breath).    Historical Provider, MD  EPINEPHrine (EPIPEN 2-PAK) 0.3 mg/0.3 mL IJ SOAJ injection Inject 0.3 mLs (0.3 mg total) into the muscle once as needed (for severe allergic reaction). CAll 911 immediately if you have to use this medicine 05/20/14   Harvie Heck, [name]  HYDROcodone-acetaminophen (NORCO/VICODIN) 5-325 MG per tablet Take 2 tablets by mouth every 4 (four) hours as needed. 08/10/14   Fransico Meadow, [name]  OVER THE COUNTER MEDICATION  Place 2 drops into both eyes daily. "OTC Pure Eyes"    Historical Provider, MD  sitaGLIPtin-metformin (JANUMET) 50-500 MG per tablet Take 1 tablet by mouth 2 (two) times daily with a meal.    Historical Provider, MD   Triage vitals: BP 177/94 mmHg  Pulse 88  Temp(Src) 98.2 F (36.8 C) (Oral)  Resp 18  SpO2 98%  LMP 11/30/2014 Physical Exam  Constitutional: She appears well-developed and well-nourished. No distress.  HENT:  Head: Normocephalic and atraumatic.  Eyes: Right eye exhibits no discharge. Left eye exhibits no discharge.  Cardiovascular: Normal rate and regular rhythm.   Pulmonary/Chest: Effort normal and breath sounds normal. No respiratory distress.  Abdominal: There is  tenderness. There is CVA tenderness. There is no rebound and no guarding.  Exam limited by body habitus but tenderness in  LUQ and left flank  Neurological: She is alert. Coordination normal.  Skin: No rash noted. She is not diaphoretic.  Psychiatric: She has a normal mood and affect. Her behavior is normal.  Nursing note and vitals reviewed.   ED Course  Procedures   DIAGNOSTIC STUDIES: Oxygen Saturation is 98% on RA, normal by my interpretation.  COORDINATION OF CARE:  8:10 PM Will order urine test, blood work and CT scan and pain medication. Patient agrees.   Labs Review Labs Reviewed  URINALYSIS, ROUTINE W REFLEX MICROSCOPIC - Abnormal; Notable for the following:    APPearance CLOUDY (*)    Leukocytes, UA TRACE (*)    All other components within normal limits  URINE MICROSCOPIC-ADD ON - Abnormal; Notable for the following:    Squamous Epithelial / LPF FEW (*)    All other components within normal limits  CBC WITH DIFFERENTIAL/PLATELET - Abnormal; Notable for the following:    Hemoglobin 11.1 (*)    HCT 35.4 (*)    MCH 25.2 (*)    RDW 15.6 (*)    All other components within normal limits  BASIC METABOLIC PANEL - Abnormal; Notable for the following:    Glucose, Bld 134 (*)    All other components within normal limits  URINE CULTURE  PREGNANCY, URINE    Imaging Review Ct Abdomen Pelvis W Contrast  12/15/2014   CLINICAL DATA:  Acute onset of left lower quadrant abdominal pain for 2 days. Hematuria and dysuria. Initial encounter.  EXAM: CT ABDOMEN AND PELVIS WITH CONTRAST  TECHNIQUE: Multidetector CT imaging of the abdomen and pelvis was performed using the standard protocol following bolus administration of intravenous contrast.  CONTRAST:  151mL OMNIPAQUE IOHEXOL 300 MG/ML  SOLN  COMPARISON:  Pelvic ultrasound performed 09/20/2011  FINDINGS: The visualized lung bases are clear.  The liver and spleen are unremarkable in appearance. The gallbladder is within normal limits.  The pancreas and adrenal glands are unremarkable.  There is suggestion of wedge-shaped decreased parenchymal attenuation within the interpole region of the left kidney, which given the patient's symptoms raises concern for focal pyelonephritis. The kidneys are otherwise grossly unremarkable. There is no evidence of hydronephrosis. No renal or ureteral stones are seen. No perinephric stranding is appreciated.  No free fluid is identified. The small bowel is unremarkable in appearance. The stomach is within normal limits. No acute vascular abnormalities are seen. Small umbilical and periumbilical hernias are seen, containing only fat.  The appendix is normal in caliber, without evidence of appendicitis. The colon is unremarkable in appearance.  The bladder is mildly distended and grossly unremarkable. There appears to be a 3.8 cm partially degenerated fibroid arising  at the uterine fundus, with peripheral calcification. The ovaries are relatively symmetric. No suspicious adnexal masses are seen. No inguinal lymphadenopathy is seen.  No acute osseous abnormalities are identified.  IMPRESSION: 1. Suspect focal pyelonephritis at the interpole region of the left kidney. Kidneys otherwise unremarkable. 2. Small umbilical and periumbilical hernias, containing only fat. 3. Apparent 3.8 cm partially degenerated fibroid arising at the uterine fundus, with peripheral calcification.   Electronically Signed   By: Garald Balding M.D.   On: 12/15/2014 22:46     EKG Interpretation None      MDM   Final diagnoses:  Pyelonephritis    34 year old female with left flank pain and mild dysuria. Urinalysis not overly impressive, but given symptoms and CT scan will treat for pyelonephritis. She is afebrile. Hemodynamically stable. Nontoxic. I feel she is appropriate for outpatient treatment. Penicillin allergy. Ciprofloxacin. Urine culture sent.  I personally preformed the services scribed in my presence. The recorded  information has been reviewed is accurate. Virgel Manifold, MD.    Virgel Manifold, MD 12/15/14 [id number]

## 2014-12-15 NOTE — ED Notes (Signed)
Patient reports no ride at present.  Notified patient that we could not give her pain medication until she has a ride here.  Patient states "That is some fucking bullshit!".  Explained that it was for safety purposes and this is why we could not give it to her at the point.  Patient continued cussing but voiced understanding.  States she will let staff know when and if she can get a ride home.

## 2014-12-15 NOTE — ED Notes (Signed)
Pt c/o freq  painful urination x 2 days

## 2014-12-15 NOTE — ED Notes (Signed)
Friend at the bedside.

## 2014-12-15 NOTE — ED Notes (Signed)
Patient has ride on the way.

## 2014-12-15 NOTE — Discharge Instructions (Signed)
Pyelonephritis, Adult °Pyelonephritis is a kidney infection. A kidney infection can happen quickly, or it can last for a long time. °HOME CARE  °· Take your medicine (antibiotics) as told. Finish it even if you start to feel better. °· Keep all doctor visits as told. °· Drink enough fluids to keep your pee (urine) clear or pale yellow. °· Only take medicine as told by your doctor. °GET HELP RIGHT AWAY IF:  °· You have a fever or lasting symptoms for more than 2-3 days. °· You have a fever and your symptoms suddenly get worse. °· You cannot take your medicine or drink fluids as told. °· You have chills and shaking. °· You feel very weak or pass out (faint). °· You do not feel better after 2 days. °MAKE SURE YOU: °· Understand these instructions. °· Will watch your condition. °· Will get help right away if you are not doing well or get worse. °Document Released: 08/28/2004 Document Revised: 01/20/2012 Document Reviewed: 01/08/2011 °[employer]® Patient Information ©2015 [employer]. This information is not intended to replace advice given to you by your health care provider. Make sure you discuss any questions you have with your health care provider. ° °

## 2014-12-16 LAB — URINE CULTURE: Colony Count: 50000

## 2015-05-08 ENCOUNTER — Encounter (HOSPITAL_BASED_OUTPATIENT_CLINIC_OR_DEPARTMENT_OTHER): Payer: Self-pay | Admitting: Emergency Medicine

## 2015-05-08 ENCOUNTER — Emergency Department (HOSPITAL_BASED_OUTPATIENT_CLINIC_OR_DEPARTMENT_OTHER)
Admission: EM | Admit: 2015-05-08 | Discharge: 2015-05-08 | Disposition: A | Discharge status: Home / Self Care | Payer: Managed Care, Other (non HMO) | Attending: Emergency Medicine | Admitting: Emergency Medicine

## 2015-05-08 DIAGNOSIS — Z8669 Personal history of other diseases of the nervous system and sense organs: Secondary | ICD-10-CM | POA: Insufficient documentation

## 2015-05-08 DIAGNOSIS — Z880 Allergy status to penicillin: Secondary | ICD-10-CM | POA: Diagnosis not present

## 2015-05-08 DIAGNOSIS — Z8632 Personal history of gestational diabetes: Secondary | ICD-10-CM | POA: Diagnosis not present

## 2015-05-08 DIAGNOSIS — Z86018 Personal history of other benign neoplasm: Secondary | ICD-10-CM | POA: Diagnosis not present

## 2015-05-08 DIAGNOSIS — Z7951 Long term (current) use of inhaled steroids: Secondary | ICD-10-CM | POA: Diagnosis not present

## 2015-05-08 DIAGNOSIS — Z3202 Encounter for pregnancy test, result negative: Secondary | ICD-10-CM | POA: Diagnosis not present

## 2015-05-08 DIAGNOSIS — J45909 Unspecified asthma, uncomplicated: Secondary | ICD-10-CM | POA: Diagnosis not present

## 2015-05-08 DIAGNOSIS — R1013 Epigastric pain: Secondary | ICD-10-CM | POA: Diagnosis present

## 2015-05-08 DIAGNOSIS — Z9851 Tubal ligation status: Secondary | ICD-10-CM | POA: Diagnosis not present

## 2015-05-08 DIAGNOSIS — Z79899 Other long term (current) drug therapy: Secondary | ICD-10-CM | POA: Diagnosis not present

## 2015-05-08 DIAGNOSIS — Z87891 Personal history of nicotine dependence: Secondary | ICD-10-CM | POA: Diagnosis not present

## 2015-05-08 DIAGNOSIS — Z87440 Personal history of urinary (tract) infections: Secondary | ICD-10-CM | POA: Insufficient documentation

## 2015-05-08 LAB — CBC WITH DIFFERENTIAL/PLATELET
Basophils Absolute: 0 10*3/uL (ref 0.0–0.1)
Basophils Relative: 0 %
EOS PCT: 5 %
Eosinophils Absolute: 0.4 10*3/uL (ref 0.0–0.7)
HCT: 34.4 % — ABNORMAL LOW (ref 36.0–46.0)
Hemoglobin: 10.5 g/dL — ABNORMAL LOW (ref 12.0–15.0)
LYMPHS PCT: 21 %
Lymphs Abs: 1.7 10*3/uL (ref 0.7–4.0)
MCH: 24.4 pg — ABNORMAL LOW (ref 26.0–34.0)
MCHC: 30.5 g/dL (ref 30.0–36.0)
MCV: 80 fL (ref 78.0–100.0)
MONO ABS: 0.8 10*3/uL (ref 0.1–1.0)
Monocytes Relative: 10 %
Neutro Abs: 5 10*3/uL (ref 1.7–7.7)
Neutrophils Relative %: 64 %
Platelets: 299 10*3/uL (ref 150–400)
RBC: 4.3 MIL/uL (ref 3.87–5.11)
RDW: 16.3 % — AB (ref 11.5–15.5)
WBC: 7.8 10*3/uL (ref 4.0–10.5)

## 2015-05-08 LAB — URINALYSIS, ROUTINE W REFLEX MICROSCOPIC
BILIRUBIN URINE: NEGATIVE
Glucose, UA: NEGATIVE mg/dL
HGB URINE DIPSTICK: NEGATIVE
KETONES UR: NEGATIVE mg/dL
Leukocytes, UA: NEGATIVE
NITRITE: NEGATIVE
PROTEIN: NEGATIVE mg/dL
SPECIFIC GRAVITY, URINE: 1.023 (ref 1.005–1.030)
UROBILINOGEN UA: 0.2 mg/dL (ref 0.0–1.0)
pH: 6.5 (ref 5.0–8.0)

## 2015-05-08 LAB — COMPREHENSIVE METABOLIC PANEL
ALT: 14 U/L (ref 14–54)
AST: 14 U/L — AB (ref 15–41)
Albumin: 3.5 g/dL (ref 3.5–5.0)
Alkaline Phosphatase: 59 U/L (ref 38–126)
Anion gap: 6 (ref 5–15)
BUN: 8 mg/dL (ref 6–20)
CO2: 29 mmol/L (ref 22–32)
Calcium: 8.7 mg/dL — ABNORMAL LOW (ref 8.9–10.3)
Chloride: 101 mmol/L (ref 101–111)
Creatinine, Ser: 0.68 mg/dL (ref 0.44–1.00)
Glucose, Bld: 135 mg/dL — ABNORMAL HIGH (ref 65–99)
POTASSIUM: 4 mmol/L (ref 3.5–5.1)
SODIUM: 136 mmol/L (ref 135–145)
Total Bilirubin: 0.3 mg/dL (ref 0.3–1.2)
Total Protein: 7.1 g/dL (ref 6.5–8.1)

## 2015-05-08 LAB — LIPASE, BLOOD: Lipase: 36 U/L (ref 22–51)

## 2015-05-08 LAB — PREGNANCY, URINE: Preg Test, Ur: NEGATIVE

## 2015-05-08 MED ORDER — ACETAMINOPHEN 500 MG PO TABS
1000.0000 mg | ORAL_TABLET | Freq: Once | ORAL | Status: AC
  Administered 2015-05-08: 1000 mg via ORAL
  Filled 2015-05-08: qty 2

## 2015-05-08 MED ORDER — IBUPROFEN 800 MG PO TABS
800.0000 mg | ORAL_TABLET | Freq: Once | ORAL | Status: AC
  Administered 2015-05-08: 800 mg via ORAL
  Filled 2015-05-08: qty 1

## 2015-05-08 NOTE — ED Notes (Signed)
Patient reports that she has "a bladder infection"  - reports that she has had one in the past and it feels the same way.  Reports bilateral flank pain

## 2015-05-08 NOTE — Discharge Instructions (Signed)

## 2015-05-08 NOTE — ED Provider Notes (Signed)
CSN: [id number]     Arrival date & time 05/08/15  1351 History   First MD Initiated Contact with Patient 05/08/15 1401     Chief Complaint  Patient presents with  . Flank Pain     (Consider location/radiation/quality/duration/timing/severity/associated sxs/prior Treatment) Patient is a 34 y.o. female presenting with flank pain. The history is provided by the patient.  Flank Pain This is a recurrent problem. The current episode started less than 1 hour ago. The problem occurs constantly. The problem has not changed since onset.Associated symptoms include abdominal pain (epigastric). Pertinent negatives include no chest pain, no headaches and no shortness of breath. Nothing aggravates the symptoms. Nothing relieves the symptoms. She has tried nothing for the symptoms. The treatment provided no relief.   34 yo F with a chief complaint of back pain. Patient states that this has been going on for the past couple days. She states that this is similar to the prior urinary tract infection that she had. Patient denies any dysuria or increased frequency fevers or chills. Patient denies any lower abdominal pain. States that she's having crampy upper epigastric pain. This radiates into her back. Denies any vomiting or diarrhea.   Past Medical History  Diagnosis Date  . Gestational diabetes   . Morbid obesity ([place])   . Asthma   . Sleep apnea   . Urinary tract infection   . Fibroid   . Abnormal Pap smear     f/u wnl   Past Surgical History  Procedure Laterality Date  . Dilation and curettage of uterus    . Cesarean section    . Tubal ligation     Family History  Problem Relation Age of Onset  . Allergies Mother   . Asthma Mother   . Hypertension Mother   . Diabetes Mother   . Anesthesia problems Neg Hx   . Hypotension Neg Hx   . Malignant hyperthermia Neg Hx   . Pseudochol deficiency Neg Hx   . Hearing loss Neg Hx   . Diabetes Father   . Diabetes Maternal Grandmother   . Heart  disease Maternal Grandmother    Social History  Substance Use Topics  . Smoking status: Former Research scientist (life sciences)  . Smokeless tobacco: Never Used  . Alcohol Use: Yes     Comment: occasional   OB History    Gravida Para Term Preterm AB TAB SAB Ectopic Multiple Living   3 1 1  0 2 0 2 0 0 2     Review of Systems  Constitutional: Negative for fever and chills.  HENT: Negative for congestion and rhinorrhea.   Eyes: Negative for redness and visual disturbance.  Respiratory: Negative for shortness of breath and wheezing.   Cardiovascular: Negative for chest pain and palpitations.  Gastrointestinal: Positive for abdominal pain (epigastric). Negative for nausea and vomiting.  Genitourinary: Positive for flank pain. Negative for dysuria and urgency.  Musculoskeletal: Negative for myalgias and arthralgias.  Skin: Negative for pallor and wound.  Neurological: Negative for dizziness and headaches.      Allergies  Other and Penicillins  Home Medications   Prior to Admission medications   Medication Sig Start Date End Date Taking? Authorizing Provider  albuterol (PROVENTIL HFA;VENTOLIN HFA) 108 (90 BASE) MCG/ACT inhaler Inhale 2-3 puffs into the lungs every 6 (six) hours as needed for wheezing. Asthma/SOB    Historical Provider, MD  budesonide-formoterol (SYMBICORT) 160-4.5 MCG/ACT inhaler Inhale 2 puffs into the lungs daily as needed (for shortness of breath).  Historical Provider, MD  ciprofloxacin (CIPRO) 500 MG tablet Take 1 tablet (500 mg total) by mouth every 12 (twelve) hours. 12/15/14   Virgel Manifold, MD  EPINEPHrine (EPIPEN 2-PAK) 0.3 mg/0.3 mL IJ SOAJ injection Inject 0.3 mLs (0.3 mg total) into the muscle once as needed (for severe allergic reaction). CAll 911 immediately if you have to use this medicine 05/20/14   Harvie Heck, [name]  HYDROcodone-acetaminophen (NORCO/VICODIN) 5-325 MG per tablet Take 2 tablets by mouth every 4 (four) hours as needed. 08/10/14   Fransico Meadow, [name]  OVER  THE COUNTER MEDICATION Place 2 drops into both eyes daily. "OTC Pure Eyes"    Historical Provider, MD  sitaGLIPtin-metformin (JANUMET) 50-500 MG per tablet Take 1 tablet by mouth 2 (two) times daily with a meal.    Historical Provider, MD  traMADol (ULTRAM) 50 MG tablet Take 1 tablet (50 mg total) by mouth every 6 (six) hours as needed. 12/15/14   Virgel Manifold, MD   BP 163/86 mmHg  Pulse 100  Temp(Src) 98.4 F (36.9 C) (Oral)  Resp 20  Ht 5' 6.5" (1.689 m)  Wt 350 lb (158.759 kg)  BMI 55.65 kg/m2  SpO2 100% Physical Exam  Constitutional: She is oriented to person, place, and time. She appears well-developed and well-nourished. No distress.  HENT:  Head: Normocephalic and atraumatic.  Eyes: EOM are normal. Pupils are equal, round, and reactive to light.  Neck: Normal range of motion. Neck supple.  Cardiovascular: Normal rate and regular rhythm.  Exam reveals no gallop and no friction rub.   No murmur heard. Pulmonary/Chest: Effort normal. She has no wheezes. She has no rales.  Abdominal: Soft. She exhibits no distension. There is tenderness (mild epigastric, negative murphys). There is no rebound and no guarding.  Musculoskeletal: She exhibits no edema or tenderness.  Neurological: She is alert and oriented to person, place, and time.  Skin: Skin is warm and dry. She is not diaphoretic.  Psychiatric: She has a normal mood and affect. Her behavior is normal.    ED Course  Procedures (including critical care time) Labs Review Labs Reviewed  CBC WITH DIFFERENTIAL/PLATELET - Abnormal; Notable for the following:    Hemoglobin 10.5 (*)    HCT 34.4 (*)    MCH 24.4 (*)    RDW 16.3 (*)    All other components within normal limits  COMPREHENSIVE METABOLIC PANEL - Abnormal; Notable for the following:    Glucose, Bld 135 (*)    Calcium 8.7 (*)    AST 14 (*)    All other components within normal limits  URINALYSIS, ROUTINE W REFLEX MICROSCOPIC (NOT AT [facility])  PREGNANCY, URINE  LIPASE,  BLOOD    Imaging Review No results found. I have personally reviewed and evaluated these images and lab results as part of my medical decision-making.   EKG Interpretation None      MDM   Final diagnoses:  Epigastric pain    34 yo F with epigastric abdominal pain. UA negative for infection. Will obtain a CBC CMP lipase. Treated with GI cocktail.  Laboratory evaluation unremarkable.  3:16 PM:  I have discussed the diagnosis/risks/treatment options with the patient and family and believe the pt to be eligible for discharge home to follow-up with PCP. We also discussed returning to the ED immediately if new or worsening sx occur. We discussed the sx which are most concerning (e.g., sudden worsening pain, fever, inability to tolerate by mouth) that necessitate immediate return. Medications administered to the  patient during their visit and any new prescriptions provided to the patient are listed below.  Medications given during this visit Medications  acetaminophen (TYLENOL) tablet 1,000 mg (not administered)  ibuprofen (ADVIL,MOTRIN) tablet 800 mg (not administered)    New Prescriptions   No medications on file    The patient appears reasonably screen and/or stabilized for discharge and I doubt any other medical condition or other [facility] requiring further screening, evaluation, or treatment in the ED at this time prior to discharge.    Deno Etienne, DO 05/08/15 [phone]

## 2015-06-18 ENCOUNTER — Encounter (HOSPITAL_COMMUNITY): Payer: Self-pay | Admitting: *Deleted

## 2015-06-18 ENCOUNTER — Emergency Department (HOSPITAL_COMMUNITY)
Admission: EM | Admit: 2015-06-18 | Discharge: 2015-06-18 | Disposition: A | Discharge status: Home / Self Care | Payer: Managed Care, Other (non HMO) | Attending: Emergency Medicine | Admitting: Emergency Medicine

## 2015-06-18 DIAGNOSIS — Z79899 Other long term (current) drug therapy: Secondary | ICD-10-CM | POA: Diagnosis not present

## 2015-06-18 DIAGNOSIS — Z3202 Encounter for pregnancy test, result negative: Secondary | ICD-10-CM | POA: Diagnosis not present

## 2015-06-18 DIAGNOSIS — Z87440 Personal history of urinary (tract) infections: Secondary | ICD-10-CM | POA: Insufficient documentation

## 2015-06-18 DIAGNOSIS — Z792 Long term (current) use of antibiotics: Secondary | ICD-10-CM | POA: Diagnosis not present

## 2015-06-18 DIAGNOSIS — J45909 Unspecified asthma, uncomplicated: Secondary | ICD-10-CM | POA: Insufficient documentation

## 2015-06-18 DIAGNOSIS — Z9851 Tubal ligation status: Secondary | ICD-10-CM | POA: Diagnosis not present

## 2015-06-18 DIAGNOSIS — Z880 Allergy status to penicillin: Secondary | ICD-10-CM | POA: Insufficient documentation

## 2015-06-18 DIAGNOSIS — Z86018 Personal history of other benign neoplasm: Secondary | ICD-10-CM | POA: Insufficient documentation

## 2015-06-18 DIAGNOSIS — Z8632 Personal history of gestational diabetes: Secondary | ICD-10-CM | POA: Insufficient documentation

## 2015-06-18 DIAGNOSIS — B379 Candidiasis, unspecified: Secondary | ICD-10-CM | POA: Diagnosis not present

## 2015-06-18 DIAGNOSIS — L299 Pruritus, unspecified: Secondary | ICD-10-CM | POA: Diagnosis present

## 2015-06-18 DIAGNOSIS — Z87891 Personal history of nicotine dependence: Secondary | ICD-10-CM | POA: Insufficient documentation

## 2015-06-18 LAB — URINE MICROSCOPIC-ADD ON

## 2015-06-18 LAB — URINALYSIS, ROUTINE W REFLEX MICROSCOPIC
BILIRUBIN URINE: NEGATIVE
Glucose, UA: NEGATIVE mg/dL
KETONES UR: NEGATIVE mg/dL
NITRITE: NEGATIVE
Protein, ur: NEGATIVE mg/dL
Specific Gravity, Urine: 1.021 (ref 1.005–1.030)
UROBILINOGEN UA: 0.2 mg/dL (ref 0.0–1.0)
pH: 7 (ref 5.0–8.0)

## 2015-06-18 LAB — WET PREP, GENITAL
Clue Cells Wet Prep HPF POC: NONE SEEN
TRICH WET PREP: NONE SEEN
Yeast Wet Prep HPF POC: NONE SEEN

## 2015-06-18 LAB — PREGNANCY, URINE: PREG TEST UR: NEGATIVE

## 2015-06-18 MED ORDER — FLUCONAZOLE 100 MG PO TABS
200.0000 mg | ORAL_TABLET | Freq: Once | ORAL | Status: AC
  Administered 2015-06-18: 200 mg via ORAL
  Filled 2015-06-18: qty 2

## 2015-06-18 NOTE — ED Notes (Signed)
Pt reports vaginal infection and itching for several days.

## 2015-06-18 NOTE — ED Provider Notes (Signed)
CSN: [id number]     Arrival date & time 06/18/15  1114 History   First MD Initiated Contact with Patient 06/18/15 1159     Chief Complaint  Patient presents with  . Vaginal Itching     (Consider location/radiation/quality/duration/timing/severity/associated sxs/prior Treatment) Patient is a 34 y.o. female presenting with vaginal itching. The history is provided by the patient and medical records.  Vaginal Itching    34 year old female with history of morbid obesity, asthma, sleep apnea, uterine fibroids, presenting to the ED for vaginal itching for the past several days. Patient states her vaginal area feels "irritated and raw". She reports white vaginal discharge without odor. Patient is in a long-term relationship with her boyfriend of several years. She does not have exquisite concern for STD at this time. She denies any abdominal pain, pelvic pain, nausea, vomiting, or diarrhea. She denies any urinary symptoms. Patient is status post tubal ligation, cycles have been normal.  Past Medical History  Diagnosis Date  . Gestational diabetes   . Morbid obesity ([place])   . Asthma   . Sleep apnea   . Urinary tract infection   . Fibroid   . Abnormal Pap smear     f/u wnl   Past Surgical History  Procedure Laterality Date  . Dilation and curettage of uterus    . Cesarean section    . Tubal ligation     Family History  Problem Relation Age of Onset  . Allergies Mother   . Asthma Mother   . Hypertension Mother   . Diabetes Mother   . Anesthesia problems Neg Hx   . Hypotension Neg Hx   . Malignant hyperthermia Neg Hx   . Pseudochol deficiency Neg Hx   . Hearing loss Neg Hx   . Diabetes Father   . Diabetes Maternal Grandmother   . Heart disease Maternal Grandmother    Social History  Substance Use Topics  . Smoking status: Former Research scientist (life sciences)  . Smokeless tobacco: Never Used  . Alcohol Use: Yes     Comment: occasional   OB History    Gravida Para Term Preterm AB TAB SAB Ectopic  Multiple Living   3 1 1  0 2 0 2 0 0 2     Review of Systems  Genitourinary: Positive for vaginal discharge and vaginal pain (itching).  All other systems reviewed and are negative.     Allergies  Other and Penicillins  Home Medications   Prior to Admission medications   Medication Sig Start Date End Date Taking? Authorizing Provider  albuterol (PROVENTIL HFA;VENTOLIN HFA) 108 (90 BASE) MCG/ACT inhaler Inhale 2-3 puffs into the lungs every 6 (six) hours as needed for wheezing. Asthma/SOB    Historical Provider, MD  budesonide-formoterol (SYMBICORT) 160-4.5 MCG/ACT inhaler Inhale 2 puffs into the lungs daily as needed (for shortness of breath).    Historical Provider, MD  ciprofloxacin (CIPRO) 500 MG tablet Take 1 tablet (500 mg total) by mouth every 12 (twelve) hours. 12/15/14   Virgel Manifold, MD  EPINEPHrine (EPIPEN 2-PAK) 0.3 mg/0.3 mL IJ SOAJ injection Inject 0.3 mLs (0.3 mg total) into the muscle once as needed (for severe allergic reaction). CAll 911 immediately if you have to use this medicine 05/20/14   Harvie Heck, [name]  HYDROcodone-acetaminophen (NORCO/VICODIN) 5-325 MG per tablet Take 2 tablets by mouth every 4 (four) hours as needed. 08/10/14   Fransico Meadow, [name]  OVER THE COUNTER MEDICATION Place 2 drops into both eyes daily. "OTC Pure Eyes"  Historical Provider, MD  sitaGLIPtin-metformin (JANUMET) 50-500 MG per tablet Take 1 tablet by mouth 2 (two) times daily with a meal.    Historical Provider, MD  traMADol (ULTRAM) 50 MG tablet Take 1 tablet (50 mg total) by mouth every 6 (six) hours as needed. 12/15/14   Virgel Manifold, MD   BP 135/92 mmHg  Pulse 92  Temp(Src) 99 F (37.2 C) (Oral)  Resp 18  SpO2 97%  LMP 06/04/2015   Physical Exam  Constitutional: She is oriented to person, place, and time. She appears well-developed and well-nourished. No distress.  Morbidly obese, sleeping with son on her chest  HENT:  Head: Normocephalic and atraumatic.  Mouth/Throat:  Oropharynx is clear and moist.  Eyes: Conjunctivae and EOM are normal. Pupils are equal, round, and reactive to light.  Neck: Normal range of motion.  Cardiovascular: Normal rate, regular rhythm and normal heart sounds.   Pulmonary/Chest: Effort normal and breath sounds normal. No respiratory distress. She has no wheezes.  Abdominal: Soft. Bowel sounds are normal.  Genitourinary: Cervix exhibits no motion tenderness. Right adnexum displays no tenderness. Left adnexum displays no tenderness. No bleeding in the vagina. No foreign body around the vagina. Vaginal discharge found.  Normal female external genitalia without visible lesions; moderate amount of curd-like vaginal discharge noted in vaginal vault; vaginal walls appears irritated; cervical os closed; no adnexal or CMT  Musculoskeletal: Normal range of motion.  Neurological: She is alert and oriented to person, place, and time.  Skin: Skin is warm and dry. She is not diaphoretic.  Psychiatric: She has a normal mood and affect.  Nursing note and vitals reviewed.   ED Course  Procedures (including critical care time) Labs Review Labs Reviewed  WET PREP, GENITAL - Abnormal; Notable for the following:    WBC, Wet Prep HPF POC FEW (*)    All other components within normal limits  URINALYSIS, ROUTINE W REFLEX MICROSCOPIC (NOT AT [facility]) - Abnormal; Notable for the following:    APPearance HAZY (*)    Hgb urine dipstick SMALL (*)    Leukocytes, UA LARGE (*)    All other components within normal limits  URINE MICROSCOPIC-ADD ON - Abnormal; Notable for the following:    Squamous Epithelial / LPF FEW (*)    Bacteria, UA FEW (*)    All other components within normal limits  PREGNANCY, URINE  POC URINE PREG, ED  GC/CHLAMYDIA PROBE AMP ([place]) NOT AT [facility]    Imaging Review No results found. I have personally reviewed and evaluated these images and lab results as part of my medical decision-making.   EKG Interpretation None       MDM   Final diagnoses:  Yeast infection   34 year old female here with vaginal itching and irritation for the past several days. Patient is afebrile, nontoxic. No other associated symptoms. UA is overall noninfectious.  There is no yeast noted on wet prep, however exam findings consistent with this.  Patient treated with diflucan here in ED.  Gc/chl pending.  Patient will FU with PCP.  Discussed plan with patient, he/she acknowledged understanding and agreed with plan of care.  Return precautions given for new or worsening symptoms.  Larene Pickett, [name] 06/18/15 1528  Charlesetta Shanks, MD 07/04/15 [phone]

## 2015-06-18 NOTE — Discharge Instructions (Signed)
You have been given diflucan here today for yeast infection. Please follow-up with your primary care physician.

## 2015-06-19 LAB — GC/CHLAMYDIA PROBE AMP (~~LOC~~) NOT AT ARMC
Chlamydia: NEGATIVE
NEISSERIA GONORRHEA: NEGATIVE

## 2015-07-17 ENCOUNTER — Encounter (HOSPITAL_BASED_OUTPATIENT_CLINIC_OR_DEPARTMENT_OTHER): Payer: Self-pay | Admitting: Emergency Medicine

## 2015-07-17 ENCOUNTER — Emergency Department (HOSPITAL_BASED_OUTPATIENT_CLINIC_OR_DEPARTMENT_OTHER)
Admission: EM | Admit: 2015-07-17 | Discharge: 2015-07-17 | Disposition: A | Discharge status: Home / Self Care | Payer: Managed Care, Other (non HMO) | Attending: Emergency Medicine | Admitting: Emergency Medicine

## 2015-07-17 DIAGNOSIS — Z3202 Encounter for pregnancy test, result negative: Secondary | ICD-10-CM | POA: Diagnosis not present

## 2015-07-17 DIAGNOSIS — Z87440 Personal history of urinary (tract) infections: Secondary | ICD-10-CM | POA: Diagnosis not present

## 2015-07-17 DIAGNOSIS — Z880 Allergy status to penicillin: Secondary | ICD-10-CM | POA: Insufficient documentation

## 2015-07-17 DIAGNOSIS — Z8632 Personal history of gestational diabetes: Secondary | ICD-10-CM | POA: Insufficient documentation

## 2015-07-17 DIAGNOSIS — M545 Low back pain: Secondary | ICD-10-CM | POA: Diagnosis not present

## 2015-07-17 DIAGNOSIS — R319 Hematuria, unspecified: Secondary | ICD-10-CM | POA: Insufficient documentation

## 2015-07-17 DIAGNOSIS — Z86018 Personal history of other benign neoplasm: Secondary | ICD-10-CM | POA: Insufficient documentation

## 2015-07-17 DIAGNOSIS — J45909 Unspecified asthma, uncomplicated: Secondary | ICD-10-CM | POA: Diagnosis not present

## 2015-07-17 DIAGNOSIS — Z87891 Personal history of nicotine dependence: Secondary | ICD-10-CM | POA: Insufficient documentation

## 2015-07-17 DIAGNOSIS — Z79899 Other long term (current) drug therapy: Secondary | ICD-10-CM | POA: Diagnosis not present

## 2015-07-17 DIAGNOSIS — J069 Acute upper respiratory infection, unspecified: Secondary | ICD-10-CM | POA: Diagnosis not present

## 2015-07-17 DIAGNOSIS — R0981 Nasal congestion: Secondary | ICD-10-CM | POA: Diagnosis present

## 2015-07-17 DIAGNOSIS — Z7984 Long term (current) use of oral hypoglycemic drugs: Secondary | ICD-10-CM | POA: Diagnosis not present

## 2015-07-17 DIAGNOSIS — Z8669 Personal history of other diseases of the nervous system and sense organs: Secondary | ICD-10-CM | POA: Insufficient documentation

## 2015-07-17 LAB — URINE MICROSCOPIC-ADD ON

## 2015-07-17 LAB — URINALYSIS, ROUTINE W REFLEX MICROSCOPIC
Bilirubin Urine: NEGATIVE
GLUCOSE, UA: NEGATIVE mg/dL
Ketones, ur: NEGATIVE mg/dL
Leukocytes, UA: NEGATIVE
Nitrite: NEGATIVE
PH: 6.5 (ref 5.0–8.0)
Protein, ur: NEGATIVE mg/dL
Specific Gravity, Urine: 1.024 (ref 1.005–1.030)

## 2015-07-17 LAB — PREGNANCY, URINE: PREG TEST UR: NEGATIVE

## 2015-07-17 MED ORDER — GUAIFENESIN-CODEINE 100-10 MG/5ML PO SOLN: 10.0000 mL | Freq: Four times a day (QID) | ORAL | Status: DC | PRN

## 2015-07-17 NOTE — ED Notes (Signed)
Pt c/o severe nasal congestion, productive cough with yellow/brown/clear sputum, nausea and lower bilateral back pain with small amount of blood in urine 2 days ago.

## 2015-07-17 NOTE — ED Provider Notes (Signed)
CSN: [id number]     Arrival date & time 07/17/15  [id number] History   First MD Initiated Contact with Patient 07/17/15 1116     Chief Complaint  Patient presents with  . Hematuria  . Nasal Congestion  . Back Pain     (Consider location/radiation/quality/duration/timing/severity/associated sxs/prior Treatment) HPI Comments: Patient is a 34 year old female who presents with complaints of nasal congestion, cough, low back pain, and noticing a small amount of blood in her urine. These symptoms all began 2 days ago. She denies any fevers or chills. She denies any difficulty breathing. She denies any dysuria. She does report her children have had similar symptoms at home.  Patient is a 34 y.o. female presenting with hematuria. The history is provided by the patient.  Hematuria This is a new problem. The current episode started 2 days ago. The problem has not changed since onset.Nothing aggravates the symptoms. Nothing relieves the symptoms. She has tried nothing for the symptoms.    Past Medical History  Diagnosis Date  . Gestational diabetes   . Morbid obesity ([place])   . Asthma   . Sleep apnea   . Urinary tract infection   . Fibroid   . Abnormal Pap smear     f/u wnl   Past Surgical History  Procedure Laterality Date  . Dilation and curettage of uterus    . Cesarean section    . Tubal ligation     Family History  Problem Relation Age of Onset  . Allergies Mother   . Asthma Mother   . Hypertension Mother   . Diabetes Mother   . Anesthesia problems Neg Hx   . Hypotension Neg Hx   . Malignant hyperthermia Neg Hx   . Pseudochol deficiency Neg Hx   . Hearing loss Neg Hx   . Diabetes Father   . Diabetes Maternal Grandmother   . Heart disease Maternal Grandmother    Social History  Substance Use Topics  . Smoking status: Former Research scientist (life sciences)  . Smokeless tobacco: Never Used  . Alcohol Use: No     Comment: occasional   OB History    Gravida Para Term Preterm AB TAB SAB Ectopic  Multiple Living   3 1 1  0 2 0 2 0 0 2     Review of Systems  Genitourinary: Positive for hematuria.  All other systems reviewed and are negative.     Allergies  Other and Penicillins  Home Medications   Prior to Admission medications   Medication Sig Start Date End Date Taking? Authorizing Provider  albuterol (PROVENTIL HFA;VENTOLIN HFA) 108 (90 BASE) MCG/ACT inhaler Inhale 2-3 puffs into the lungs every 6 (six) hours as needed for wheezing. Asthma/SOB    Historical Provider, MD  budesonide-formoterol (SYMBICORT) 160-4.5 MCG/ACT inhaler Inhale 2 puffs into the lungs daily as needed (for shortness of breath).    Historical Provider, MD  ciprofloxacin (CIPRO) 500 MG tablet Take 1 tablet (500 mg total) by mouth every 12 (twelve) hours. 12/15/14   Virgel Manifold, MD  EPINEPHrine (EPIPEN 2-PAK) 0.3 mg/0.3 mL IJ SOAJ injection Inject 0.3 mLs (0.3 mg total) into the muscle once as needed (for severe allergic reaction). CAll 911 immediately if you have to use this medicine 05/20/14   Harvie Heck, [name]  HYDROcodone-acetaminophen (NORCO/VICODIN) 5-325 MG per tablet Take 2 tablets by mouth every 4 (four) hours as needed. 08/10/14   Fransico Meadow, [name]  OVER THE COUNTER MEDICATION Place 2 drops into both eyes daily. "OTC Pure Eyes"  Historical Provider, MD  sitaGLIPtin-metformin (JANUMET) 50-500 MG per tablet Take 1 tablet by mouth 2 (two) times daily with a meal.    Historical Provider, MD  traMADol (ULTRAM) 50 MG tablet Take 1 tablet (50 mg total) by mouth every 6 (six) hours as needed. 12/15/14   Virgel Manifold, MD   BP 141/94 mmHg  Pulse 92  Temp(Src) 98.2 F (36.8 C) (Oral)  Resp 18  Ht 5\' 6"  (1.676 m)  Wt 350 lb (158.759 kg)  BMI 56.52 kg/m2  SpO2 99%  LMP 06/26/2015 (Approximate) Physical Exam  Constitutional: She is oriented to person, place, and time. She appears well-developed and well-nourished. No distress.  HENT:  Head: Normocephalic and atraumatic.  Mouth/Throat: Oropharynx  is clear and moist.  Neck: Normal range of motion. Neck supple.  Cardiovascular: Normal rate and regular rhythm.  Exam reveals no gallop and no friction rub.   No murmur heard. Pulmonary/Chest: Effort normal and breath sounds normal. No respiratory distress. She has no wheezes.  Abdominal: Soft. Bowel sounds are normal. She exhibits no distension. There is no tenderness.  Musculoskeletal: Normal range of motion.  Neurological: She is alert and oriented to person, place, and time.  Skin: Skin is warm and dry. She is not diaphoretic.  Nursing note and vitals reviewed.   ED Course  Procedures (including critical care time) Labs Review Labs Reviewed  URINALYSIS, ROUTINE W REFLEX MICROSCOPIC (NOT AT [facility]) - Abnormal; Notable for the following:    Hgb urine dipstick TRACE (*)    All other components within normal limits  URINE MICROSCOPIC-ADD ON - Abnormal; Notable for the following:    Squamous Epithelial / LPF 0-5 (*)    Bacteria, UA FEW (*)    All other components within normal limits  PREGNANCY, URINE    Imaging Review No results found. I have personally reviewed and evaluated these images and lab results as part of my medical decision-making.   EKG Interpretation None      MDM   Final diagnoses:  None    Symptoms most likely viral in nature. She will be treated with over-the-counter medications, Robitussin with codeine, and when necessary return.    Veryl Speak, MD 07/17/15 [phone]

## 2015-07-17 NOTE — Discharge Instructions (Signed)
Robitussin with codeine as prescribed as needed for cough.  Return to the emergency department if symptoms significantly worsen or change.   Upper Respiratory Infection, Adult Most upper respiratory infections (URIs) are a viral infection of the air passages leading to the lungs. A URI affects the nose, throat, and upper air passages. The most common type of URI is nasopharyngitis and is typically referred to as "the common cold." URIs run their course and usually go away on their own. Most of the time, a URI does not require medical attention, but sometimes a bacterial infection in the upper airways can follow a viral infection. This is called a secondary infection. Sinus and middle ear infections are common types of secondary upper respiratory infections. Bacterial pneumonia can also complicate a URI. A URI can worsen asthma and chronic obstructive pulmonary disease (COPD). Sometimes, these complications can require emergency medical care and may be life threatening.  CAUSES Almost all URIs are caused by viruses. A virus is a type of germ and can spread from one person to another.  RISKS FACTORS You may be at risk for a URI if:   You smoke.   You have chronic heart or lung disease.  You have a weakened defense (immune) system.   You are very young or very old.   You have nasal allergies or asthma.  You work in crowded or poorly ventilated areas.  You work in health care facilities or schools. SIGNS AND SYMPTOMS  Symptoms typically develop 2-3 days after you come in contact with a cold virus. Most viral URIs last 7-10 days. However, viral URIs from the influenza virus (flu virus) can last 14-18 days and are typically more severe. Symptoms may include:   Runny or stuffy (congested) nose.   Sneezing.   Cough.   Sore throat.   Headache.   Fatigue.   Fever.   Loss of appetite.   Pain in your forehead, behind your eyes, and over your cheekbones (sinus  pain).  Muscle aches.  DIAGNOSIS  Your health care provider may diagnose a URI by:  Physical exam.  Tests to check that your symptoms are not due to another condition such as:  Strep throat.  Sinusitis.  Pneumonia.  Asthma. TREATMENT  A URI goes away on its own with time. It cannot be cured with medicines, but medicines may be prescribed or recommended to relieve symptoms. Medicines may help:  Reduce your fever.  Reduce your cough.  Relieve nasal congestion. HOME CARE INSTRUCTIONS   Take medicines only as directed by your health care provider.   Gargle warm saltwater or take cough drops to comfort your throat as directed by your health care provider.  Use a warm mist humidifier or inhale steam from a shower to increase air moisture. This may make it easier to breathe.  Drink enough fluid to keep your urine clear or pale yellow.   Eat soups and other clear broths and maintain good nutrition.   Rest as needed.   Return to work when your temperature has returned to normal or as your health care provider advises. You may need to stay home longer to avoid infecting others. You can also use a face mask and careful hand washing to prevent spread of the virus.  Increase the usage of your inhaler if you have asthma.   Do not use any tobacco products, including cigarettes, chewing tobacco, or electronic cigarettes. If you need help quitting, ask your health care provider. PREVENTION  The best way  to protect yourself from getting a cold is to practice good hygiene.   Avoid oral or hand contact with people with cold symptoms.   Wash your hands often if contact occurs.  There is no clear evidence that vitamin C, vitamin E, echinacea, or exercise reduces the chance of developing a cold. However, it is always recommended to get plenty of rest, exercise, and practice good nutrition.  SEEK MEDICAL CARE IF:   You are getting worse rather than better.   Your symptoms are  not controlled by medicine.   You have chills.  You have worsening shortness of breath.  You have brown or red mucus.  You have yellow or brown nasal discharge.  You have pain in your face, especially when you bend forward.  You have a fever.  You have swollen neck glands.  You have pain while swallowing.  You have white areas in the back of your throat. SEEK IMMEDIATE MEDICAL CARE IF:   You have severe or persistent:  Headache.  Ear pain.  Sinus pain.  Chest pain.  You have chronic lung disease and any of the following:  Wheezing.  Prolonged cough.  Coughing up blood.  A change in your usual mucus.  You have a stiff neck.  You have changes in your:  Vision.  Hearing.  Thinking.  Mood. MAKE SURE YOU:   Understand these instructions.  Will watch your condition.  Will get help right away if you are not doing well or get worse.   This information is not intended to replace advice given to you by your health care provider. Make sure you discuss any questions you have with your health care provider.   Document Released: 01/14/2001 Document Revised: 12/05/2014 Document Reviewed: 10/26/2013 Elsevier Interactive Patient Education [employer].

## 2015-08-27 ENCOUNTER — Ambulatory Visit (INDEPENDENT_AMBULATORY_CARE_PROVIDER_SITE_OTHER): Payer: Managed Care, Other (non HMO) | Admitting: Obstetrics & Gynecology

## 2015-08-27 ENCOUNTER — Encounter: Payer: Self-pay | Admitting: Obstetrics & Gynecology

## 2015-08-27 VITALS — BP 140/83 | HR 76 | Ht 66.5 in | Wt 375.6 lb

## 2015-08-27 DIAGNOSIS — Z1151 Encounter for screening for human papillomavirus (HPV): Secondary | ICD-10-CM | POA: Diagnosis not present

## 2015-08-27 DIAGNOSIS — N920 Excessive and frequent menstruation with regular cycle: Secondary | ICD-10-CM

## 2015-08-27 DIAGNOSIS — D259 Leiomyoma of uterus, unspecified: Secondary | ICD-10-CM | POA: Diagnosis not present

## 2015-08-27 DIAGNOSIS — Z124 Encounter for screening for malignant neoplasm of cervix: Secondary | ICD-10-CM | POA: Diagnosis not present

## 2015-08-27 NOTE — Patient Instructions (Signed)
Uterine Fibroids Uterine fibroids are tissue masses (tumors) that can develop in the womb (uterus). They are also called leiomyomas. This type of tumor is not cancerous (benign) and does not spread to other parts of the body outside of the pelvic area, which is between the hip bones. Occasionally, fibroids may develop in the fallopian tubes, in the cervix, or on the support structures (ligaments) that surround the uterus. You can have one or many fibroids. Fibroids can vary in size, weight, and where they grow in the uterus. Some can become quite large. Most fibroids do not require medical treatment. CAUSES A fibroid can develop when a single uterine cell keeps growing (replicating). Most cells in the human body have a control mechanism that keeps them from replicating without control. SIGNS AND SYMPTOMS Symptoms may include:   Heavy bleeding during your period.  Bleeding or spotting between periods.  Pelvic pain and pressure.  Bladder problems, such as needing to urinate more often (urinary frequency) or urgently.  Inability to reproduce offspring (infertility).  Miscarriages. DIAGNOSIS Uterine fibroids are diagnosed through a physical exam. Your health care provider may feel the lumpy tumors during a pelvic exam. Ultrasonography and an MRI may be done to determine the size, location, and number of fibroids. TREATMENT Treatment may include:  Watchful waiting. This involves getting the fibroid checked by your health care provider to see if it grows or shrinks. Follow your health care provider's recommendations for how often to have this checked.  Hormone medicines. These can be taken by mouth or given through an intrauterine device (IUD).  Surgery.  Removing the fibroids (myomectomy) or the uterus (hysterectomy).  Removing blood supply to the fibroids (uterine artery embolization). If fibroids interfere with your fertility and you want to become pregnant, your health care provider  may recommend having the fibroids removed.  HOME CARE INSTRUCTIONS  Keep all follow-up visits as directed by your health care provider. This is important.  Take medicines only as directed by your health care provider.  If you were prescribed a hormone treatment, take the hormone medicines exactly as directed.  Do not take aspirin, because it can cause bleeding.  Ask your health care provider about taking iron pills and increasing the amount of dark green, leafy vegetables in your diet. These actions can help to boost your blood iron levels, which may be affected by heavy menstrual bleeding.  Pay close attention to your period and tell your health care provider about any changes, such as:  Increased blood flow that requires you to use more pads or tampons than usual per month.  A change in the number of days that your period lasts per month.  A change in symptoms that are associated with your period, such as abdominal cramping or back pain. SEEK MEDICAL CARE IF:  You have pelvic pain, back pain, or abdominal cramps that cannot be controlled with medicines.  You have an increase in bleeding between and during periods.  You soak tampons or pads in a half hour or less.  You feel lightheaded, extra tired, or weak. SEEK IMMEDIATE MEDICAL CARE IF:  You faint.  You have a sudden increase in pelvic pain.   This information is not intended to replace advice given to you by your health care provider. Make sure you discuss any questions you have with your health care provider.   Document Released: 07/18/2000 Document Revised: 08/11/2014 Document Reviewed: 01/17/2014 Elsevier Interactive Patient Education 2016 Elsevier Inc.  

## 2015-08-27 NOTE — Progress Notes (Signed)
Patient ID: [name], female   DOB: 21-Feb-1981, 35 y.o.   MRN: [id number]  Chief Complaint  Patient presents with  . Menorrhagia  . Fibroids  heavy painful menses  HPI JULIAH Bryant is a 35 y.o. female.  [id number] Patient's last menstrual period was 08/19/2015.  Long history of painful heavy periods and she does not function well during menses. S/P BTL at her last cesarean section 2013 for twins. HPI  Past Medical History  Diagnosis Date  . Gestational diabetes   . Morbid obesity ([place])   . Asthma   . Sleep apnea   . Urinary tract infection   . Fibroid   . Abnormal Pap smear     f/u wnl    Past Surgical History  Procedure Laterality Date  . Dilation and curettage of uterus    . Cesarean section    . Tubal ligation      Family History  Problem Relation Age of Onset  . Allergies Mother   . Asthma Mother   . Hypertension Mother   . Diabetes Mother   . Anesthesia problems Neg Hx   . Hypotension Neg Hx   . Malignant hyperthermia Neg Hx   . Pseudochol deficiency Neg Hx   . Hearing loss Neg Hx   . Diabetes Father   . Diabetes Maternal Grandmother   . Heart disease Maternal Grandmother     Social History Social History  Substance Use Topics  . Smoking status: Former Research scientist (life sciences)  . Smokeless tobacco: Never Used  . Alcohol Use: No     Comment: occasional    Allergies  Allergen Reactions  . Other Anaphylaxis and Other (See Comments)    Pt has fish allergy Pt has allergy to butter cookies  . Penicillins Rash and Other (See Comments)    Told MD that PCN reaction is rash and that she has taken amoxicillin before    Current Outpatient Prescriptions  Medication Sig Dispense Refill  . albuterol (PROVENTIL HFA;VENTOLIN HFA) 108 (90 BASE) MCG/ACT inhaler Inhale 2-3 puffs into the lungs every 6 (six) hours as needed for wheezing. Asthma/SOB    . budesonide-formoterol (SYMBICORT) 160-4.5 MCG/ACT inhaler Inhale 2 puffs into the lungs daily as needed (for shortness of  breath).    . EPINEPHrine (EPIPEN 2-PAK) 0.3 mg/0.3 mL IJ SOAJ injection Inject 0.3 mLs (0.3 mg total) into the muscle once as needed (for severe allergic reaction). CAll 911 immediately if you have to use this medicine 2 Device 1  . sitaGLIPtin-metformin (JANUMET) 50-500 MG per tablet Take 1 tablet by mouth 2 (two) times daily with a meal.    . ciprofloxacin (CIPRO) 500 MG tablet Take 1 tablet (500 mg total) by mouth every 12 (twelve) hours. (Patient not taking: Reported on 08/27/2015) 14 tablet 0  . guaiFENesin-codeine 100-10 MG/5ML syrup Take 10 mLs by mouth every 6 (six) hours as needed for cough. (Patient not taking: Reported on 08/27/2015) 120 mL 0  . HYDROcodone-acetaminophen (NORCO/VICODIN) 5-325 MG per tablet Take 2 tablets by mouth every 4 (four) hours as needed. (Patient not taking: Reported on 08/27/2015) 20 tablet 0  . OVER THE COUNTER MEDICATION Place 2 drops into both eyes daily. Reported on 08/27/2015    . traMADol (ULTRAM) 50 MG tablet Take 1 tablet (50 mg total) by mouth every 6 (six) hours as needed. (Patient not taking: Reported on 08/27/2015) 12 tablet 0   No current facility-administered medications for this visit.    Review of Systems Review of Systems  Constitutional: Negative.   Gastrointestinal: Negative.   Genitourinary: Positive for menstrual problem and pelvic pain (cramps). Negative for vaginal discharge.    Blood pressure 140/83, pulse 76, height 5' 6.5" (1.689 m), weight 375 lb 9.6 oz (170.371 kg), last menstrual period 08/19/2015.  Physical Exam Physical Exam  Constitutional: She is oriented to person, place, and time. No distress.  Morbid obese  Cardiovascular: Normal rate.   Pulmonary/Chest: Effort normal. No respiratory distress.  Genitourinary: Vagina normal. No vaginal discharge found.  Pap done no masses or tenderness  Neurological: She is alert and oriented to person, place, and time.  Skin: Skin is warm and dry.  Psychiatric: She has a normal mood  and affect. Her behavior is normal.    Data Reviewed CLINICAL DATA: Acute onset of left lower quadrant abdominal pain for 2 days. Hematuria and dysuria. Initial encounter.  EXAM: CT ABDOMEN AND PELVIS WITH CONTRAST  TECHNIQUE: Multidetector CT imaging of the abdomen and pelvis was performed using the standard protocol following bolus administration of intravenous contrast.  CONTRAST: 163mL OMNIPAQUE IOHEXOL 300 MG/ML SOLN  COMPARISON: Pelvic ultrasound performed 09/20/2011  FINDINGS: The visualized lung bases are clear.  The liver and spleen are unremarkable in appearance. The gallbladder is within normal limits. The pancreas and adrenal glands are unremarkable.  There is suggestion of wedge-shaped decreased parenchymal attenuation within the interpole region of the left kidney, which given the patient's symptoms raises concern for focal pyelonephritis. The kidneys are otherwise grossly unremarkable. There is no evidence of hydronephrosis. No renal or ureteral stones are seen. No perinephric stranding is appreciated.  No free fluid is identified. The small bowel is unremarkable in appearance. The stomach is within normal limits. No acute vascular abnormalities are seen. Small umbilical and periumbilical hernias are seen, containing only fat.  The appendix is normal in caliber, without evidence of appendicitis. The colon is unremarkable in appearance.  The bladder is mildly distended and grossly unremarkable. There appears to be a 3.8 cm partially degenerated fibroid arising at the uterine fundus, with peripheral calcification. The ovaries are relatively symmetric. No suspicious adnexal masses are seen. No inguinal lymphadenopathy is seen.  No acute osseous abnormalities are identified.  IMPRESSION: 1. Suspect focal pyelonephritis at the interpole region of the left kidney. Kidneys otherwise unremarkable. 2. Small umbilical and periumbilical hernias,  containing only fat. 3. Apparent 3.8 cm partially degenerated fibroid arising at the uterine fundus, with peripheral calcification.   Electronically Signed  By: Garald Balding M.D.  On: 12/15/2014 22:46 CBC    Component Value Date/Time   WBC 7.8 05/08/2015 1440   RBC 4.30 05/08/2015 1440   HGB 10.5* 05/08/2015 1440   HCT 34.4* 05/08/2015 1440   PLT 299 05/08/2015 1440   MCV 80.0 05/08/2015 1440   MCH 24.4* 05/08/2015 1440   MCHC 30.5 05/08/2015 1440   RDW 16.3* 05/08/2015 1440   LYMPHSABS 1.7 05/08/2015 1440   MONOABS 0.8 05/08/2015 1440   EOSABS 0.4 05/08/2015 1440   BASOSABS 0.0 05/08/2015 1440     Assessment    Menorrhagia and dysmenorrhea with fibroid uterus  Pap done S/P BTL Morbid obesity  Plan    Pelvic US and office f/u to discussed management options        ARNOLD,JAMES 08/27/2015, 2:23 PM

## 2015-08-30 LAB — CYTOLOGY - PAP

## 2015-08-31 ENCOUNTER — Telehealth: Payer: Self-pay | Admitting: General Practice

## 2015-08-31 NOTE — Telephone Encounter (Signed)
Per Dr Roselie Awkward, patient's pap was normal. Called patient, no answer- left message to call us back at the clinics for non urgent results.

## 2015-09-03 ENCOUNTER — Ambulatory Visit (HOSPITAL_COMMUNITY)
Admission: RE | Admit: 2015-09-03 | Discharge: 2015-09-03 | Disposition: A | Discharge status: Home / Self Care | Payer: Managed Care, Other (non HMO) | Source: Ambulatory Visit | Attending: Obstetrics & Gynecology | Admitting: Obstetrics & Gynecology

## 2015-09-03 DIAGNOSIS — N920 Excessive and frequent menstruation with regular cycle: Secondary | ICD-10-CM | POA: Insufficient documentation

## 2015-09-03 DIAGNOSIS — D259 Leiomyoma of uterus, unspecified: Secondary | ICD-10-CM | POA: Diagnosis not present

## 2015-09-03 NOTE — Telephone Encounter (Signed)
PT INFORMED OF RESULTS.

## 2015-09-17 ENCOUNTER — Encounter: Payer: Self-pay | Admitting: Obstetrics & Gynecology

## 2015-09-17 ENCOUNTER — Ambulatory Visit (INDEPENDENT_AMBULATORY_CARE_PROVIDER_SITE_OTHER): Payer: Managed Care, Other (non HMO) | Admitting: Obstetrics & Gynecology

## 2015-09-17 VITALS — BP 171/89 | HR 102 | Temp 98.1°F | Ht 66.0 in | Wt 371.9 lb

## 2015-09-17 DIAGNOSIS — D259 Leiomyoma of uterus, unspecified: Secondary | ICD-10-CM

## 2015-09-17 DIAGNOSIS — N920 Excessive and frequent menstruation with regular cycle: Secondary | ICD-10-CM | POA: Diagnosis not present

## 2015-09-17 MED ORDER — NORGESTIMATE-ETH ESTRADIOL 0.25-35 MG-MCG PO TABS: 1.0000 | ORAL_TABLET | Freq: Every day | ORAL | Status: DC

## 2015-09-17 NOTE — Progress Notes (Signed)
Pt scheduled @ [facility] for Feb 17th @ 1130.

## 2015-09-17 NOTE — Patient Instructions (Signed)
Oral Contraception Use Oral contraceptive pills (OCPs) are medicines taken to prevent pregnancy. OCPs work by preventing the ovaries from releasing eggs. The hormones in OCPs also cause the cervical mucus to thicken, preventing the sperm from entering the uterus. The hormones also cause the uterine lining to become thin, not allowing a fertilized egg to attach to the inside of the uterus. OCPs are highly effective when taken exactly as prescribed. However, OCPs do not prevent sexually transmitted diseases (STDs). Safe sex practices, such as using condoms along with an OCP, can help prevent STDs. Before taking OCPs, you may have a physical exam and Pap test. Your health care provider may also order blood tests if necessary. Your health care provider will make sure you are a good candidate for oral contraception. Discuss with your health care provider the possible side effects of the OCP you may be prescribed. When starting an OCP, it can take 2 to 3 months for the body to adjust to the changes in hormone levels in your body.  HOW TO TAKE ORAL CONTRACEPTIVE PILLS Your health care provider may advise you on how to start taking the first cycle of OCPs. Otherwise, you can:   Start on day 1 of your menstrual period. You will not need any backup contraceptive protection with this start time.   Start on the first Sunday after your menstrual period or the day you get your prescription. In these cases, you will need to use backup contraceptive protection for the first week.   Start the pill at any time of your cycle. If you take the pill within 5 days of the start of your period, you are protected against pregnancy right away. In this case, you will not need a backup form of birth control. If you start at any other time of your menstrual cycle, you will need to use another form of birth control for 7 days. If your OCP is the type called a minipill, it will protect you from pregnancy after taking it for 2 days (48  hours). After you have started taking OCPs:   If you forget to take 1 pill, take it as soon as you remember. Take the next pill at the regular time.   If you miss 2 or more pills, call your health care provider because different pills have different instructions for missed doses. Use backup birth control until your next menstrual period starts.   If you use a 28-day pack that contains inactive pills and you miss 1 of the last 7 pills (pills with no hormones), it will not matter. Throw away the rest of the non-hormone pills and start a new pill pack.  No matter which day you start the OCP, you will always start a new pack on that same day of the week. Have an extra pack of OCPs and a backup contraceptive method available in case you miss some pills or lose your OCP pack.  HOME CARE INSTRUCTIONS   Do not smoke.   Always use a condom to protect against STDs. OCPs do not protect against STDs.   Use a calendar to mark your menstrual period days.   Read the information and directions that came with your OCP. Talk to your health care provider if you have questions.  SEEK MEDICAL CARE IF:   You develop nausea and vomiting.   You have abnormal vaginal discharge or bleeding.   You develop a rash.   You miss your menstrual period.   You are losing   your hair.   You need treatment for mood swings or depression.   You get dizzy when taking the OCP.   You develop acne from taking the OCP.   You become pregnant.  SEEK IMMEDIATE MEDICAL CARE IF:   You develop chest pain.   You develop shortness of breath.   You have an uncontrolled or severe headache.   You develop numbness or slurred speech.   You develop visual problems.   You develop pain, redness, and swelling in the legs.    This information is not intended to replace advice given to you by your health care provider. Make sure you discuss any questions you have with your health care provider.   Document  Released: 07/10/2011 Document Revised: 08/11/2014 Document Reviewed: 01/09/2013 Elsevier Interactive Patient Education 2016 [employer]. Menorrhagia Menorrhagia is the medical term for when your menstrual periods are heavy or last longer than usual. With menorrhagia, every period you have may cause enough blood loss and cramping that you are unable to maintain your usual activities. CAUSES  In some cases, the cause of heavy periods is unknown, but a number of conditions may cause menorrhagia. Common causes include:  A problem with the hormone-producing thyroid gland (hypothyroid).  Noncancerous growths in the uterus (polyps or fibroids).  An imbalance of the estrogen and progesterone hormones.  One of your ovaries not releasing an egg during one or more months.  Side effects of having an intrauterine device (IUD).  Side effects of some medicines, such as anti-inflammatory medicines or blood thinners.  A bleeding disorder that stops your blood from clotting normally. SIGNS AND SYMPTOMS  During a normal period, bleeding lasts between 4 and 8 days. Signs that your periods are too heavy include:  You routinely have to change your pad or tampon every 1 or 2 hours because it is completely soaked.  You pass blood clots larger than 1 inch (2.5 cm) in size.  You have bleeding for more than 7 days.  You need to use pads and tampons at the same time because of heavy bleeding.  You need to wake up to change your pads or tampons during the night.  You have symptoms of anemia, such as tiredness, fatigue, or shortness of breath. DIAGNOSIS  Your health care provider will perform a physical exam and ask you questions about your symptoms and menstrual history. Other tests may be ordered based on what the health care provider finds during the exam. These tests can include:  Blood tests. Blood tests are used to check if you are pregnant or have hormonal changes, a bleeding or thyroid disorder,  low iron levels (anemia), or other problems.  Endometrial biopsy. Your health care provider takes a sample of tissue from the inside of your uterus to be examined under a microscope.  Pelvic ultrasound. This test uses sound waves to make a picture of your uterus, ovaries, and vagina. The pictures can show if you have fibroids or other growths.  Hysteroscopy. For this test, your health care provider will use a small telescope to look inside your uterus. Based on the results of your initial tests, your health care provider may recommend further testing. TREATMENT  Treatment may not be needed. If it is needed, your health care provider may recommend treatment with one or more medicines first. If these do not reduce bleeding enough, a surgical treatment might be an option. The best treatment for you will depend on:   Whether you need to prevent pregnancy.  Your desire to have children in the future.  The cause and severity of your bleeding.  Your opinion and personal preference.  Medicines for menorrhagia may include:  Birth control methods that use hormones. These include the pill, skin patch, vaginal ring, shots that you get every 3 months, hormonal IUD, and implant. These treatments reduce bleeding during your menstrual period.  Medicines that thicken blood and slow bleeding.  Medicines that reduce swelling, such as ibuprofen.  Medicines that contain a synthetic hormone called progestin.   Medicines that make the ovaries stop working for a short time.  You may need surgical treatment for menorrhagia if the medicines are unsuccessful. Treatment options include:  Dilation and curettage (D&C). In this procedure, your health care provider opens (dilates) your cervix and then scrapes or suctions tissue from the lining of your uterus to reduce menstrual bleeding.  Operative hysteroscopy. This procedure uses a tiny tube with a light (hysteroscope) to view your uterine cavity and can  help in the surgical removal of a polyp that may be causing heavy periods.  Endometrial ablation. Through various techniques, your health care provider permanently destroys the entire lining of your uterus (endometrium). After endometrial ablation, most women have little or no menstrual flow. Endometrial ablation reduces your ability to become pregnant.  Endometrial resection. This surgical procedure uses an electrosurgical wire loop to remove the lining of the uterus. This procedure also reduces your ability to become pregnant.  Hysterectomy. Surgical removal of the uterus and cervix is a permanent procedure that stops menstrual periods. Pregnancy is not possible after a hysterectomy. This procedure requires anesthesia and hospitalization. HOME CARE INSTRUCTIONS   Only take over-the-counter or prescription medicines as directed by your health care provider. Take prescribed medicines exactly as directed. Do not change or switch medicines without consulting your health care provider.  Take any prescribed iron pills exactly as directed by your health care provider. Long-term heavy bleeding may result in low iron levels. Iron pills help replace the iron your body lost from heavy bleeding. Iron may cause constipation. If this becomes a problem, increase the bran, fruits, and roughage in your diet.  Do not take aspirin or medicines that contain aspirin 1 week before or during your menstrual period. Aspirin may make the bleeding worse.  If you need to change your sanitary pad or tampon more than once every 2 hours, stay in bed and rest as much as possible until the bleeding stops.  Eat well-balanced meals. Eat foods high in iron. Examples are leafy green vegetables, meat, liver, eggs, and whole grain breads and cereals. Do not try to lose weight until the abnormal bleeding has stopped and your blood iron level is back to normal. SEEK MEDICAL CARE IF:   You soak through a pad or tampon every 1 or 2  hours, and this happens every time you have a period.  You need to use pads and tampons at the same time because you are bleeding so much.  You need to change your pad or tampon during the night.  You have a period that lasts for more than 8 days.  You pass clots bigger than 1 inch wide.  You have irregular periods that happen more or less often than once a month.  You feel dizzy or faint.  You feel very weak or tired.  You feel short of breath or feel your heart is beating too fast when you exercise.  You have nausea and vomiting or diarrhea while you are  taking your medicine.  You have any problems that may be related to the medicine you are taking. SEEK IMMEDIATE MEDICAL CARE IF:   You soak through 4 or more pads or tampons in 2 hours.  You have any bleeding while you are pregnant. MAKE SURE YOU:   Understand these instructions.  Will watch your condition.  Will get help right away if you are not doing well or get worse.   This information is not intended to replace advice given to you by your health care provider. Make sure you discuss any questions you have with your health care provider.   Document Released: 07/21/2005 Document Revised: 07/26/2013 Document Reviewed: 01/09/2013 Elsevier Interactive Patient Education [employer].

## 2015-09-17 NOTE — Progress Notes (Signed)
Patient ID: [name], female   DOB: 1980-08-13, 35 y.o.   MRN: [id number]  Chief Complaint  Patient presents with  . Menorrhagia  f/u US result  HPI [name] is a 35 y.o. female.  [id number] Patient's last menstrual period was 09/08/2015. Menses are regular, 7 days with pain and heavy flow and clots. S/P BTL.  HPI  Past Medical History  Diagnosis Date  . Gestational diabetes   . Morbid obesity ([place])   . Asthma   . Sleep apnea   . Urinary tract infection   . Fibroid   . Abnormal Pap smear     f/u wnl    Past Surgical History  Procedure Laterality Date  . Dilation and curettage of uterus    . Cesarean section    . Tubal ligation      Family History  Problem Relation Age of Onset  . Allergies Mother   . Asthma Mother   . Hypertension Mother   . Diabetes Mother   . Anesthesia problems Neg Hx   . Hypotension Neg Hx   . Malignant hyperthermia Neg Hx   . Pseudochol deficiency Neg Hx   . Hearing loss Neg Hx   . Diabetes Father   . Diabetes Maternal Grandmother   . Heart disease Maternal Grandmother     Social History Social History  Substance Use Topics  . Smoking status: Former Research scientist (life sciences)  . Smokeless tobacco: Never Used  . Alcohol Use: No     Comment: occasional    Allergies  Allergen Reactions  . Other Anaphylaxis and Other (See Comments)    Pt has fish allergy Pt has allergy to butter cookies  . Penicillins Rash and Other (See Comments)    Told MD that PCN reaction is rash and that she has taken amoxicillin before    Current Outpatient Prescriptions  Medication Sig Dispense Refill  . albuterol (PROVENTIL HFA;VENTOLIN HFA) 108 (90 BASE) MCG/ACT inhaler Inhale 2-3 puffs into the lungs every 6 (six) hours as needed for wheezing. Asthma/SOB    . budesonide-formoterol (SYMBICORT) 160-4.5 MCG/ACT inhaler Inhale 2 puffs into the lungs daily as needed (for shortness of breath).    . ciprofloxacin (CIPRO) 500 MG tablet Take 1 tablet (500 mg total) by mouth  every 12 (twelve) hours. (Patient not taking: Reported on 08/27/2015) 14 tablet 0  . EPINEPHrine (EPIPEN 2-PAK) 0.3 mg/0.3 mL IJ SOAJ injection Inject 0.3 mLs (0.3 mg total) into the muscle once as needed (for severe allergic reaction). CAll 911 immediately if you have to use this medicine 2 Device 1  . guaiFENesin-codeine 100-10 MG/5ML syrup Take 10 mLs by mouth every 6 (six) hours as needed for cough. (Patient not taking: Reported on 08/27/2015) 120 mL 0  . HYDROcodone-acetaminophen (NORCO/VICODIN) 5-325 MG per tablet Take 2 tablets by mouth every 4 (four) hours as needed. (Patient not taking: Reported on 08/27/2015) 20 tablet 0  . norgestimate-ethinyl estradiol (ORTHO-CYCLEN,SPRINTEC,PREVIFEM) 0.25-35 MG-MCG tablet Take 1 tablet by mouth daily. 1 Package 11  . OVER THE COUNTER MEDICATION Place 2 drops into both eyes daily. Reported on 08/27/2015    . sitaGLIPtin-metformin (JANUMET) 50-500 MG per tablet Take 1 tablet by mouth 2 (two) times daily with a meal.    . traMADol (ULTRAM) 50 MG tablet Take 1 tablet (50 mg total) by mouth every 6 (six) hours as needed. (Patient not taking: Reported on 08/27/2015) 12 tablet 0   No current facility-administered medications for this visit.    Review of  Systems Review of Systems  Constitutional: Positive for fatigue.  Genitourinary: Positive for vaginal bleeding, menstrual problem and pelvic pain.  Musculoskeletal: Positive for back pain.    Blood pressure 171/89, pulse 102, temperature 98.1 F (36.7 C), temperature source Oral, height 5\' 6"  (1.676 m), weight 371 lb 14.4 oz (168.693 kg), last menstrual period 09/08/2015.  Physical Exam Physical Exam  Constitutional: She is oriented to person, place, and time. She appears well-developed. No distress.  Pulmonary/Chest: Effort normal.  Neurological: She is alert and oriented to person, place, and time.  Psychiatric: She has a normal mood and affect. Her behavior is normal.    Data Reviewed  CLINICAL  DATA: Menorrhagia with regular cycle. History of C-section x2. History of fibroids. Morbid obesity. LMP 08/19/2015. Gravida 4 para 2 SAB 2.  EXAM: TRANSABDOMINAL AND TRANSVAGINAL ULTRASOUND OF PELVIS  TECHNIQUE: Both transabdominal and transvaginal ultrasound examinations of the pelvis were performed. Transabdominal technique was performed for global imaging of the pelvis including uterus, ovaries, adnexal regions, and pelvic cul-de-sac. It was necessary to proceed with endovaginal exam following the transabdominal exam to visualize the endometrium.  COMPARISON: 12/15/2014  FINDINGS: Uterus  Measurements: At least 11.1 x 5.3 x 6.4 cm. Fundal fibroid is 3.6 x 3.1 x 4.0 cm  Endometrium  Thickness: 7.0 mm. No focal abnormality visualized.  Right ovary  Measurements: The ovary is not visualized, either absent or obscured . No adnexal mass identified.  Left ovary  Measurements: 3.3 x 1.9 x 2.2 cm. Normal appearance/no adnexal mass.  Other findings  No abnormal free fluid.  IMPRESSION: 1. Fundal fibroid measures 4.0 cm. 2. Normal endometrial thickness. If bleeding remains unresponsive to hormonal or medical therapy, sonohysterogram should be considered for focal lesion work-up. (Ref: Radiological Reasoning: Algorithmic Workup of Abnormal Vaginal Bleeding with Endovaginal Sonography and Sonohysterography. AJR [id number]). 3. Nonvisualized right ovary; normal appearance of the left ovary.   Electronically Signed  By: Nolon Nations M.D.  On: 09/03/2015 14:28  Assessment    Menorrhagia with solitary fundal fibroid Dysmenorrhea     Plan    Will start OCP, Ortho Cyclen and RTC 4 month to discuss cycle control. She will see Dr Baird Cancer to review her medical management        Tyshaun Vinzant 09/17/2015, 2:58 PM

## 2015-10-15 ENCOUNTER — Emergency Department (HOSPITAL_BASED_OUTPATIENT_CLINIC_OR_DEPARTMENT_OTHER)
Admission: EM | Admit: 2015-10-15 | Discharge: 2015-10-15 | Disposition: A | Discharge status: Home / Self Care | Payer: Managed Care, Other (non HMO) | Attending: Emergency Medicine | Admitting: Emergency Medicine

## 2015-10-15 ENCOUNTER — Encounter (HOSPITAL_BASED_OUTPATIENT_CLINIC_OR_DEPARTMENT_OTHER): Payer: Self-pay | Admitting: *Deleted

## 2015-10-15 DIAGNOSIS — Z7951 Long term (current) use of inhaled steroids: Secondary | ICD-10-CM | POA: Diagnosis not present

## 2015-10-15 DIAGNOSIS — Z87440 Personal history of urinary (tract) infections: Secondary | ICD-10-CM | POA: Diagnosis not present

## 2015-10-15 DIAGNOSIS — Z79899 Other long term (current) drug therapy: Secondary | ICD-10-CM | POA: Insufficient documentation

## 2015-10-15 DIAGNOSIS — J45909 Unspecified asthma, uncomplicated: Secondary | ICD-10-CM | POA: Insufficient documentation

## 2015-10-15 DIAGNOSIS — E119 Type 2 diabetes mellitus without complications: Secondary | ICD-10-CM | POA: Diagnosis not present

## 2015-10-15 DIAGNOSIS — R079 Chest pain, unspecified: Secondary | ICD-10-CM

## 2015-10-15 DIAGNOSIS — Z87891 Personal history of nicotine dependence: Secondary | ICD-10-CM | POA: Diagnosis not present

## 2015-10-15 DIAGNOSIS — Z8669 Personal history of other diseases of the nervous system and sense organs: Secondary | ICD-10-CM | POA: Insufficient documentation

## 2015-10-15 DIAGNOSIS — Z86018 Personal history of other benign neoplasm: Secondary | ICD-10-CM | POA: Insufficient documentation

## 2015-10-15 DIAGNOSIS — Z880 Allergy status to penicillin: Secondary | ICD-10-CM | POA: Insufficient documentation

## 2015-10-15 DIAGNOSIS — I10 Essential (primary) hypertension: Secondary | ICD-10-CM | POA: Diagnosis not present

## 2015-10-15 DIAGNOSIS — Z7984 Long term (current) use of oral hypoglycemic drugs: Secondary | ICD-10-CM | POA: Diagnosis not present

## 2015-10-15 DIAGNOSIS — Z8632 Personal history of gestational diabetes: Secondary | ICD-10-CM | POA: Insufficient documentation

## 2015-10-15 DIAGNOSIS — Z793 Long term (current) use of hormonal contraceptives: Secondary | ICD-10-CM | POA: Diagnosis not present

## 2015-10-15 LAB — CBC WITH DIFFERENTIAL/PLATELET
BASOS ABS: 0 10*3/uL (ref 0.0–0.1)
BASOS PCT: 0 %
Eosinophils Absolute: 0.4 10*3/uL (ref 0.0–0.7)
Eosinophils Relative: 5 %
HEMATOCRIT: 35.2 % — AB (ref 36.0–46.0)
HEMOGLOBIN: 10.8 g/dL — AB (ref 12.0–15.0)
LYMPHS PCT: 21 %
Lymphs Abs: 1.5 10*3/uL (ref 0.7–4.0)
MCH: 24.3 pg — ABNORMAL LOW (ref 26.0–34.0)
MCHC: 30.7 g/dL (ref 30.0–36.0)
MCV: 79.1 fL (ref 78.0–100.0)
MONOS PCT: 7 %
Monocytes Absolute: 0.5 10*3/uL (ref 0.1–1.0)
NEUTROS ABS: 5 10*3/uL (ref 1.7–7.7)
NEUTROS PCT: 67 %
Platelets: 268 10*3/uL (ref 150–400)
RBC: 4.45 MIL/uL (ref 3.87–5.11)
RDW: 16 % — ABNORMAL HIGH (ref 11.5–15.5)
WBC: 7.4 10*3/uL (ref 4.0–10.5)

## 2015-10-15 LAB — BASIC METABOLIC PANEL
ANION GAP: 9 (ref 5–15)
BUN: 9 mg/dL (ref 6–20)
CALCIUM: 8.9 mg/dL (ref 8.9–10.3)
CO2: 26 mmol/L (ref 22–32)
Chloride: 102 mmol/L (ref 101–111)
Creatinine, Ser: 0.73 mg/dL (ref 0.44–1.00)
GFR calc non Af Amer: >60 mL/min (ref >60)
GLUCOSE: 254 mg/dL — AB (ref 65–99)
POTASSIUM: 4.1 mmol/L (ref 3.5–5.1)
Sodium: 137 mmol/L (ref 135–145)

## 2015-10-15 LAB — TROPONIN I: Troponin I: <0.03 ng/mL (ref <0.031)

## 2015-10-15 NOTE — ED Provider Notes (Signed)
CSN: [id number]     Arrival date & time 10/15/15  0740 History   First MD Initiated Contact with Patient 10/15/15 [phone]     Chief Complaint  Patient presents with  . Chest Pain     (Consider location/radiation/quality/duration/timing/severity/associated sxs/prior Treatment) HPI Comments: Patient is a 35 year old female with past medical history of obesity, diabetes. She presents for evaluation of chest discomfort which is been occurring intermittently for the past month. She describes this as a pressure in the center of her chest that radiates down her arms and shoulders. She denies any shortness of breath, nausea, or diaphoresis. She denies any exertional component to her symptoms. She has no prior cardiac history.  She does report having a great deal of stress in her life stating that her mother passed away and a friend had a stroke recently.  Patient is a 35 y.o. female presenting with chest pain. The history is provided by the patient.  Chest Pain Pain location:  Substernal area Pain quality: pressure   Pain radiates to:  L shoulder and R shoulder Pain severity:  Moderate Duration:  1 month Timing:  Intermittent Progression:  Unchanged Relieved by:  Nothing Worsened by:  Nothing tried   Past Medical History  Diagnosis Date  . Gestational diabetes   . Morbid obesity ([place])   . Asthma   . Sleep apnea   . Urinary tract infection   . Fibroid   . Abnormal Pap smear     f/u wnl  . Hypertension    Past Surgical History  Procedure Laterality Date  . Dilation and curettage of uterus    . Cesarean section    . Tubal ligation     Family History  Problem Relation Age of Onset  . Allergies Mother   . Asthma Mother   . Hypertension Mother   . Diabetes Mother   . Anesthesia problems Neg Hx   . Hypotension Neg Hx   . Malignant hyperthermia Neg Hx   . Pseudochol deficiency Neg Hx   . Hearing loss Neg Hx   . Diabetes Father   . Diabetes Maternal Grandmother   . Heart disease  Maternal Grandmother    Social History  Substance Use Topics  . Smoking status: Former Research scientist (life sciences)  . Smokeless tobacco: Never Used  . Alcohol Use: No     Comment: occasional   OB History    Gravida Para Term Preterm AB TAB SAB Ectopic Multiple Living   4 2 1 1 2  0 2 0 1 2     Review of Systems  Cardiovascular: Positive for chest pain.  All other systems reviewed and are negative.     Allergies  Other and Penicillins  Home Medications   Prior to Admission medications   Medication Sig Start Date End Date Taking? Authorizing Provider  albuterol (PROVENTIL HFA;VENTOLIN HFA) 108 (90 BASE) MCG/ACT inhaler Inhale 2-3 puffs into the lungs every 6 (six) hours as needed for wheezing. Asthma/SOB   Yes Historical Provider, MD  ALPRAZolam (XANAX PO) Take 0.5 mg by mouth 2 (two) times daily.   Yes Historical Provider, MD  metFORMIN (GLUCOPHAGE) 500 MG tablet Take by mouth 2 (two) times daily with a meal.   Yes Historical Provider, MD  norgestimate-ethinyl estradiol (ORTHO-CYCLEN,SPRINTEC,PREVIFEM) 0.25-35 MG-MCG tablet Take 1 tablet by mouth daily. 09/17/15  Yes Woodroe Mode, MD  OVER THE COUNTER MEDICATION Place 2 drops into both eyes daily. Reported on 08/27/2015   Yes Historical Provider, MD  budesonide-formoterol [facility]) 160-4.5  MCG/ACT inhaler Inhale 2 puffs into the lungs daily as needed (for shortness of breath).    Historical Provider, MD  ciprofloxacin (CIPRO) 500 MG tablet Take 1 tablet (500 mg total) by mouth every 12 (twelve) hours. Patient not taking: Reported on 08/27/2015 12/15/14   Virgel Manifold, MD  EPINEPHrine (EPIPEN 2-PAK) 0.3 mg/0.3 mL IJ SOAJ injection Inject 0.3 mLs (0.3 mg total) into the muscle once as needed (for severe allergic reaction). CAll 911 immediately if you have to use this medicine 05/20/14   Harvie Heck, [name]  guaiFENesin-codeine 100-10 MG/5ML syrup Take 10 mLs by mouth every 6 (six) hours as needed for cough. Patient not taking: Reported on 08/27/2015  07/17/15   Veryl Speak, MD  HYDROcodone-acetaminophen (NORCO/VICODIN) 5-325 MG per tablet Take 2 tablets by mouth every 4 (four) hours as needed. Patient not taking: Reported on 08/27/2015 08/10/14   Fransico Meadow, [name]  sitaGLIPtin-metformin (JANUMET) 50-500 MG per tablet Take 1 tablet by mouth 2 (two) times daily with a meal.    Historical Provider, MD  traMADol (ULTRAM) 50 MG tablet Take 1 tablet (50 mg total) by mouth every 6 (six) hours as needed. Patient not taking: Reported on 08/27/2015 12/15/14   Virgel Manifold, MD   BP 130/84 mmHg  Pulse 85  Temp(Src) 98.2 F (36.8 C) (Oral)  Resp 20  Ht 5\' 6"  (1.676 m)  Wt 365 lb (165.563 kg)  BMI 58.94 kg/m2  SpO2 100%  LMP 10/12/2015 Physical Exam  Constitutional: She is oriented to person, place, and time. She appears well-developed and well-nourished. No distress.  HENT:  Head: Normocephalic and atraumatic.  Neck: Normal range of motion. Neck supple.  Cardiovascular: Normal rate and regular rhythm.  Exam reveals no gallop and no friction rub.   No murmur heard. Pulmonary/Chest: Effort normal and breath sounds normal. No respiratory distress. She has no wheezes.  Abdominal: Soft. Bowel sounds are normal. She exhibits no distension. There is no tenderness.  Musculoskeletal: Normal range of motion. She exhibits no edema.  There is no calf pain, tenderness, or swelling. Homans sign is absent bilaterally.  Neurological: She is alert and oriented to person, place, and time.  Skin: Skin is warm and dry. She is not diaphoretic.  Nursing note and vitals reviewed.   ED Course  Procedures (including critical care time) Labs Review Labs Reviewed  BASIC METABOLIC PANEL  CBC WITH DIFFERENTIAL/PLATELET  TROPONIN I    Imaging Review No results found. I have personally reviewed and evaluated these images and lab results as part of my medical decision-making.   EKG Interpretation   Date/Time:  Monday October 15 2015 07:57:40 EDT Ventricular  Rate:  91 PR Interval:  179 QRS Duration: 101 QT Interval:  372 QTC Calculation: 458 R Axis:   49 Text Interpretation:  Sinus rhythm Normal ECG Confirmed by Amyra Vantuyl  MD,  Mara Favero ([id number]) on 10/15/2015 9:01:41 AM      MDM   Final diagnoses:  None    Patient presents with complaints of chest discomfort as described in the history of present illness. She has a great deal of stress in her life and I suspect that this is the cause. Her workup reveals a normal EKG and negative troponin despite intermittent symptoms for one month. She will be discharged, with instructions to follow-up with her primary doctor.     Veryl Speak, MD 10/15/15 [phone]

## 2015-10-15 NOTE — Discharge Instructions (Signed)
Nonspecific Chest Pain  °Chest pain can be caused by many different conditions. There is always a chance that your pain could be related to something serious, such as a heart attack or a blood clot in your lungs. Chest pain can also be caused by conditions that are not life-threatening. If you have chest pain, it is very important to follow up with your health care provider. °CAUSES  °Chest pain can be caused by: °· Heartburn. °· Pneumonia or bronchitis. °· Anxiety or stress. °· Inflammation around your heart (pericarditis) or lung (pleuritis or pleurisy). °· A blood clot in your lung. °· A collapsed lung (pneumothorax). It can develop suddenly on its own (spontaneous pneumothorax) or from trauma to the chest. °· Shingles infection (varicella-zoster virus). °· Heart attack. °· Damage to the bones, muscles, and cartilage that make up your chest wall. This can include: °¨ Bruised bones due to injury. °¨ Strained muscles or cartilage due to frequent or repeated coughing or overwork. °¨ Fracture to one or more ribs. °¨ Sore cartilage due to inflammation (costochondritis). °RISK FACTORS  °Risk factors for chest pain may include: °· Activities that increase your risk for trauma or injury to your chest. °· Respiratory infections or conditions that cause frequent coughing. °· Medical conditions or overeating that can cause heartburn. °· Heart disease or family history of heart disease. °· Conditions or health behaviors that increase your risk of developing a blood clot. °· Having had chicken pox (varicella zoster). °SIGNS AND SYMPTOMS °Chest pain can feel like: °· Burning or tingling on the surface of your chest or deep in your chest. °· Crushing, pressure, aching, or squeezing pain. °· Dull or sharp pain that is worse when you move, cough, or take a deep breath. °· Pain that is also felt in your back, neck, shoulder, or arm, or pain that spreads to any of these areas. °Your chest pain may come and go, or it may stay  constant. °DIAGNOSIS °Lab tests or other studies may be needed to find the cause of your pain. Your health care provider may have you take a test called an ambulatory ECG (electrocardiogram). An ECG records your heartbeat patterns at the time the test is performed. You may also have other tests, such as: °· Transthoracic echocardiogram (TTE). During echocardiography, sound waves are used to create a picture of all of the heart structures and to look at how blood flows through your heart. °· Transesophageal echocardiogram (TEE). This is a more advanced imaging test that obtains images from inside your body. It allows your health care provider to see your heart in finer detail. °· Cardiac monitoring. This allows your health care provider to monitor your heart rate and rhythm in real time. °· Holter monitor. This is a portable device that records your heartbeat and can help to diagnose abnormal heartbeats. It allows your health care provider to track your heart activity for several days, if needed. °· Stress tests. These can be done through exercise or by taking medicine that makes your heart beat more quickly. °· Blood tests. °· Imaging tests. °TREATMENT  °Your treatment depends on what is causing your chest pain. Treatment may include: °· Medicines. These may include: °¨ Acid blockers for heartburn. °¨ Anti-inflammatory medicine. °¨ Pain medicine for inflammatory conditions. °¨ Antibiotic medicine, if an infection is present. °¨ Medicines to dissolve blood clots. °¨ Medicines to treat coronary artery disease. °· Supportive care for conditions that do not require medicines. This may include: °¨ Resting. °¨ Applying heat   or cold packs to injured areas. °¨ Limiting activities until pain decreases. °HOME CARE INSTRUCTIONS °· If you were prescribed an antibiotic medicine, finish it all even if you start to feel better. °· Avoid any activities that bring on chest pain. °· Do not use any tobacco products, including  cigarettes, chewing tobacco, or electronic cigarettes. If you need help quitting, ask your health care provider. °· Do not drink alcohol. °· Take medicines only as directed by your health care provider. °· Keep all follow-up visits as directed by your health care provider. This is important. This includes any further testing if your chest pain does not go away. °· If heartburn is the cause for your chest pain, you may be told to keep your head raised (elevated) while sleeping. This reduces the chance that acid will go from your stomach into your esophagus. °· Make lifestyle changes as directed by your health care provider. These may include: °¨ Getting regular exercise. Ask your health care provider to suggest some activities that are safe for you. °¨ Eating a heart-healthy diet. A registered dietitian can help you to learn healthy eating options. °¨ Maintaining a healthy weight. °¨ Managing diabetes, if necessary. °¨ Reducing stress. °SEEK MEDICAL CARE IF: °· Your chest pain does not go away after treatment. °· You have a rash with blisters on your chest. °· You have a fever. °SEEK IMMEDIATE MEDICAL CARE IF:  °· Your chest pain is worse. °· You have an increasing cough, or you cough up blood. °· You have severe abdominal pain. °· You have severe weakness. °· You faint. °· You have chills. °· You have sudden, unexplained chest discomfort. °· You have sudden, unexplained discomfort in your arms, back, neck, or jaw. °· You have shortness of breath at any time. °· You suddenly start to sweat, or your skin gets clammy. °· You feel nauseous or you vomit. °· You suddenly feel light-headed or dizzy. °· Your heart begins to beat quickly, or it feels like it is skipping beats. °These symptoms may represent a serious problem that is an emergency. Do not wait to see if the symptoms will go away. Get medical help right away. Call your local emergency services (911 in the [place] Do not drive yourself to the hospital. °  °This  information is not intended to replace advice given to you by your health care provider. Make sure you discuss any questions you have with your health care provider. °  °Document Released: 04/30/2005 Document Revised: 08/11/2014 Document Reviewed: 02/24/2014 °Elsevier Interactive Patient Education ©2016 [employer] ° °

## 2015-10-15 NOTE — ED Notes (Signed)
Patient states she developed intermittent chest pain under her left breast for the last three hours.  Describes the pain as "achy".  States she has been under a lot of stress for the last two weeks; mother passed away and her friend had a stroke two days ago.  States during the last two weeks, her blood pressure has been elevated and her PCP is working with her with medications.  Patient is tearful and states she may have anxiety.

## 2015-11-27 ENCOUNTER — Emergency Department (HOSPITAL_BASED_OUTPATIENT_CLINIC_OR_DEPARTMENT_OTHER)
Admission: EM | Admit: 2015-11-27 | Discharge: 2015-11-27 | Disposition: A | Discharge status: Home / Self Care | Payer: Managed Care, Other (non HMO) | Attending: Emergency Medicine | Admitting: Emergency Medicine

## 2015-11-27 ENCOUNTER — Encounter (HOSPITAL_BASED_OUTPATIENT_CLINIC_OR_DEPARTMENT_OTHER): Payer: Self-pay | Admitting: *Deleted

## 2015-11-27 DIAGNOSIS — Z79899 Other long term (current) drug therapy: Secondary | ICD-10-CM | POA: Insufficient documentation

## 2015-11-27 DIAGNOSIS — Z8632 Personal history of gestational diabetes: Secondary | ICD-10-CM | POA: Diagnosis not present

## 2015-11-27 DIAGNOSIS — Z8669 Personal history of other diseases of the nervous system and sense organs: Secondary | ICD-10-CM | POA: Diagnosis not present

## 2015-11-27 DIAGNOSIS — J0100 Acute maxillary sinusitis, unspecified: Secondary | ICD-10-CM | POA: Diagnosis not present

## 2015-11-27 DIAGNOSIS — J45909 Unspecified asthma, uncomplicated: Secondary | ICD-10-CM | POA: Insufficient documentation

## 2015-11-27 DIAGNOSIS — Z87440 Personal history of urinary (tract) infections: Secondary | ICD-10-CM | POA: Diagnosis not present

## 2015-11-27 DIAGNOSIS — I10 Essential (primary) hypertension: Secondary | ICD-10-CM | POA: Diagnosis not present

## 2015-11-27 DIAGNOSIS — Z87891 Personal history of nicotine dependence: Secondary | ICD-10-CM | POA: Diagnosis not present

## 2015-11-27 DIAGNOSIS — Z7984 Long term (current) use of oral hypoglycemic drugs: Secondary | ICD-10-CM | POA: Diagnosis not present

## 2015-11-27 DIAGNOSIS — Z86018 Personal history of other benign neoplasm: Secondary | ICD-10-CM | POA: Insufficient documentation

## 2015-11-27 DIAGNOSIS — Z793 Long term (current) use of hormonal contraceptives: Secondary | ICD-10-CM | POA: Insufficient documentation

## 2015-11-27 DIAGNOSIS — Z880 Allergy status to penicillin: Secondary | ICD-10-CM | POA: Diagnosis not present

## 2015-11-27 DIAGNOSIS — R51 Headache: Secondary | ICD-10-CM | POA: Diagnosis present

## 2015-11-27 MED ORDER — SULFAMETHOXAZOLE-TRIMETHOPRIM 800-160 MG PO TABS: 1.0000 | ORAL_TABLET | Freq: Two times a day (BID) | ORAL | Status: AC

## 2015-11-27 MED FILL — SULFAMETHOXAZOLE-TMP DS TAB: 800-160 | 10 days supply | Qty: 20 | Fill #0

## 2015-11-27 NOTE — ED Notes (Signed)
Headache, facial pressure and ear pressure.

## 2015-11-27 NOTE — Discharge Instructions (Signed)

## 2015-11-27 NOTE — ED Provider Notes (Signed)
CSN: [id number]     Arrival date & time 11/27/15  1336 History   First MD Initiated Contact with Patient 11/27/15 1402     Chief Complaint  Patient presents with  . Facial Pain     (Consider location/radiation/quality/duration/timing/severity/associated sxs/prior Treatment) Patient is a 35 y.o. female presenting with cough. The history is provided by the patient. No language interpreter was used.  Cough Cough characteristics:  Productive Sputum characteristics:  Nondescript Severity:  Moderate Onset quality:  Gradual Duration:  1 week Timing:  Constant Progression:  Worsening Chronicity:  New Context: upper respiratory infection   Relieved by:  Nothing Worsened by:  Nothing tried Ineffective treatments:  Cough suppressants and decongestant Associated symptoms: headaches, sinus congestion and sore throat   Associated symptoms: no fever   Risk factors: no recent infection     Past Medical History  Diagnosis Date  . Gestational diabetes   . Morbid obesity ([place])   . Asthma   . Sleep apnea   . Urinary tract infection   . Fibroid   . Abnormal Pap smear     f/u wnl  . Hypertension    Past Surgical History  Procedure Laterality Date  . Dilation and curettage of uterus    . Cesarean section    . Tubal ligation     Family History  Problem Relation Age of Onset  . Allergies Mother   . Asthma Mother   . Hypertension Mother   . Diabetes Mother   . Anesthesia problems Neg Hx   . Hypotension Neg Hx   . Malignant hyperthermia Neg Hx   . Pseudochol deficiency Neg Hx   . Hearing loss Neg Hx   . Diabetes Father   . Diabetes Maternal Grandmother   . Heart disease Maternal Grandmother    Social History  Substance Use Topics  . Smoking status: Former Research scientist (life sciences)  . Smokeless tobacco: Never Used  . Alcohol Use: No     Comment: occasional   OB History    Gravida Para Term Preterm AB TAB SAB Ectopic Multiple Living   4 2 1 1 2  0 2 0 1 2     Review of Systems   Constitutional: Negative for fever.  HENT: Positive for sore throat.   Respiratory: Positive for cough.   Neurological: Positive for headaches.  All other systems reviewed and are negative.     Allergies  Other and Penicillins  Home Medications   Prior to Admission medications   Medication Sig Start Date End Date Taking? Authorizing Provider  albuterol (PROVENTIL HFA;VENTOLIN HFA) 108 (90 BASE) MCG/ACT inhaler Inhale 2-3 puffs into the lungs every 6 (six) hours as needed for wheezing. Asthma/SOB    Historical Provider, MD  ALPRAZolam (XANAX PO) Take 0.5 mg by mouth 2 (two) times daily.    Historical Provider, MD  budesonide-formoterol (SYMBICORT) 160-4.5 MCG/ACT inhaler Inhale 2 puffs into the lungs daily as needed (for shortness of breath).    Historical Provider, MD  ciprofloxacin (CIPRO) 500 MG tablet Take 1 tablet (500 mg total) by mouth every 12 (twelve) hours. Patient not taking: Reported on 08/27/2015 12/15/14   Virgel Manifold, MD  EPINEPHrine (EPIPEN 2-PAK) 0.3 mg/0.3 mL IJ SOAJ injection Inject 0.3 mLs (0.3 mg total) into the muscle once as needed (for severe allergic reaction). CAll 911 immediately if you have to use this medicine 05/20/14   Harvie Heck, [name]  guaiFENesin-codeine 100-10 MG/5ML syrup Take 10 mLs by mouth every 6 (six) hours as needed for cough.  Patient not taking: Reported on 08/27/2015 07/17/15   Veryl Speak, MD  HYDROcodone-acetaminophen (NORCO/VICODIN) 5-325 MG per tablet Take 2 tablets by mouth every 4 (four) hours as needed. Patient not taking: Reported on 08/27/2015 08/10/14   Fransico Meadow, [name]  metFORMIN (GLUCOPHAGE) 500 MG tablet Take by mouth 2 (two) times daily with a meal.    Historical Provider, MD  norgestimate-ethinyl estradiol (ORTHO-CYCLEN,SPRINTEC,PREVIFEM) 0.25-35 MG-MCG tablet Take 1 tablet by mouth daily. 09/17/15   Woodroe Mode, MD  OVER THE COUNTER MEDICATION Place 2 drops into both eyes daily. Reported on 08/27/2015    Historical Provider,  MD  sitaGLIPtin-metformin (JANUMET) 50-500 MG per tablet Take 1 tablet by mouth 2 (two) times daily with a meal.    Historical Provider, MD  traMADol (ULTRAM) 50 MG tablet Take 1 tablet (50 mg total) by mouth every 6 (six) hours as needed. Patient not taking: Reported on 08/27/2015 12/15/14   Virgel Manifold, MD   BP 126/65 mmHg  Pulse 103  Temp(Src) 99.2 F (37.3 C) (Oral)  Resp 18  Ht 5\' 6"  (1.676 m)  Wt 166.017 kg  BMI 59.10 kg/m2  SpO2 99%  LMP 11/06/2015 Physical Exam  Constitutional: She is oriented to person, place, and time. She appears well-developed and well-nourished.  HENT:  Head: Normocephalic and atraumatic.  Right Ear: External ear normal.  Left Ear: External ear normal.  Nose: Nose normal.  Mouth/Throat: Oropharynx is clear and moist.  Tender maxillary and frontal sinuses  Eyes: EOM are normal.  Neck: Normal range of motion.  Cardiovascular: Normal rate and normal heart sounds.   Pulmonary/Chest: Effort normal.  Abdominal: She exhibits no distension.  Musculoskeletal: Normal range of motion.  Neurological: She is alert and oriented to person, place, and time.  Skin: Skin is warm.  Psychiatric: She has a normal mood and affect.  Nursing note and vitals reviewed.   ED Course  Procedures (including critical care time) Labs Review Labs Reviewed - No data to display  Imaging Review No results found. I have personally reviewed and evaluated these images and lab results as part of my medical decision-making.   EKG Interpretation None      MDM   Final diagnoses:  Acute maxillary sinusitis, recurrence not specified    Meds ordered this encounter  Medications  . sulfamethoxazole-trimethoprim (BACTRIM DS,SEPTRA DS) 800-160 MG tablet    Sig: Take 1 tablet by mouth 2 (two) times daily.    Dispense:  20 tablet    Refill:  0    Order Specific Question:  Supervising Provider    Answer:  Noemi Chapel [[id number]]      Hollace Kinnier [place], [name] 11/27/15  [place], MD 12/01/15 1645

## 2016-04-01 ENCOUNTER — Ambulatory Visit: Payer: Managed Care, Other (non HMO) | Admitting: Skilled Nursing Facility1

## 2016-04-08 ENCOUNTER — Ambulatory Visit: Payer: Managed Care, Other (non HMO)

## 2016-04-15 ENCOUNTER — Ambulatory Visit: Payer: Managed Care, Other (non HMO)

## 2016-04-22 ENCOUNTER — Ambulatory Visit: Payer: Managed Care, Other (non HMO) | Admitting: Skilled Nursing Facility1

## 2016-04-29 ENCOUNTER — Ambulatory Visit: Payer: Managed Care, Other (non HMO) | Admitting: Dietician

## 2016-05-06 ENCOUNTER — Ambulatory Visit: Payer: Managed Care, Other (non HMO)

## 2016-05-20 ENCOUNTER — Encounter (HOSPITAL_BASED_OUTPATIENT_CLINIC_OR_DEPARTMENT_OTHER): Payer: Self-pay | Admitting: Emergency Medicine

## 2016-05-20 ENCOUNTER — Emergency Department (HOSPITAL_BASED_OUTPATIENT_CLINIC_OR_DEPARTMENT_OTHER)
Admission: EM | Admit: 2016-05-20 | Discharge: 2016-05-20 | Disposition: A | Discharge status: Home / Self Care | Payer: Managed Care, Other (non HMO) | Attending: Emergency Medicine | Admitting: Emergency Medicine

## 2016-05-20 DIAGNOSIS — R112 Nausea with vomiting, unspecified: Secondary | ICD-10-CM | POA: Insufficient documentation

## 2016-05-20 DIAGNOSIS — R1012 Left upper quadrant pain: Secondary | ICD-10-CM | POA: Insufficient documentation

## 2016-05-20 DIAGNOSIS — R300 Dysuria: Secondary | ICD-10-CM | POA: Diagnosis not present

## 2016-05-20 DIAGNOSIS — R197 Diarrhea, unspecified: Secondary | ICD-10-CM | POA: Diagnosis not present

## 2016-05-20 DIAGNOSIS — J45909 Unspecified asthma, uncomplicated: Secondary | ICD-10-CM | POA: Insufficient documentation

## 2016-05-20 DIAGNOSIS — E119 Type 2 diabetes mellitus without complications: Secondary | ICD-10-CM | POA: Diagnosis not present

## 2016-05-20 DIAGNOSIS — Z7951 Long term (current) use of inhaled steroids: Secondary | ICD-10-CM | POA: Diagnosis not present

## 2016-05-20 DIAGNOSIS — Z7984 Long term (current) use of oral hypoglycemic drugs: Secondary | ICD-10-CM | POA: Diagnosis not present

## 2016-05-20 DIAGNOSIS — Z79899 Other long term (current) drug therapy: Secondary | ICD-10-CM | POA: Diagnosis not present

## 2016-05-20 LAB — CBG MONITORING, ED: GLUCOSE-CAPILLARY: 193 mg/dL — AB (ref 65–99)

## 2016-05-20 LAB — URINE MICROSCOPIC-ADD ON

## 2016-05-20 LAB — URINALYSIS, ROUTINE W REFLEX MICROSCOPIC
Bilirubin Urine: NEGATIVE
Glucose, UA: >1000 mg/dL — AB
Hgb urine dipstick: NEGATIVE
KETONES UR: NEGATIVE mg/dL
LEUKOCYTES UA: NEGATIVE
NITRITE: NEGATIVE
PROTEIN: NEGATIVE mg/dL
Specific Gravity, Urine: 1.027 (ref 1.005–1.030)
pH: 6 (ref 5.0–8.0)

## 2016-05-20 LAB — PREGNANCY, URINE: PREG TEST UR: NEGATIVE

## 2016-05-20 MED ORDER — ONDANSETRON 4 MG PO TBDP: 4.0000 mg | ORAL_TABLET | Freq: Three times a day (TID) | ORAL | 0 refills | Status: DC | PRN

## 2016-05-20 MED ORDER — KETOROLAC TROMETHAMINE 60 MG/2ML IM SOLN
60.0000 mg | Freq: Once | INTRAMUSCULAR | Status: AC
  Administered 2016-05-20: 60 mg via INTRAMUSCULAR
  Filled 2016-05-20: qty 2

## 2016-05-20 MED ORDER — ONDANSETRON 4 MG PO TBDP
4.0000 mg | ORAL_TABLET | Freq: Once | ORAL | Status: AC
  Administered 2016-05-20: 4 mg via ORAL
  Filled 2016-05-20: qty 1

## 2016-05-20 MED ORDER — DICYCLOMINE HCL 20 MG PO TABS: 20.0000 mg | ORAL_TABLET | Freq: Three times a day (TID) | ORAL | 0 refills | Status: DC | PRN

## 2016-05-20 MED ORDER — PANTOPRAZOLE SODIUM 20 MG PO TBEC: 20.0000 mg | DELAYED_RELEASE_TABLET | Freq: Two times a day (BID) | ORAL | 0 refills | Status: DC

## 2016-05-20 NOTE — ED Triage Notes (Signed)
Pt in c/o diffuse lower abd pain, lower back pain, some emesis, and pressure and pain with urination onset yesterday. States same sx with previous kidney infection. Pt alert, interactive, ambulatory in NAD.

## 2016-05-20 NOTE — ED Provider Notes (Signed)
[facility] Provider Note   CSN: [id number] Arrival date & time: 05/20/16  1353     History   Chief Complaint Chief Complaint  Patient presents with  . Abdominal Pain  . Dysuria    HPI [name] is a 35 y.o. female.  HPI   Lower back pain radiating around to suprapubic area and pain in LUQ, pain worse in LUQ, aching pain, worse with moving. Vomiting and fever yesterday, subjective. Today hard to sit down, hard to move, pain. Pressure when go to bathroom, then feel like needing to urinate more often.  Hurts to move. Worse on left upper quadrant back, constant over the last 2 days. No hx of nephrolithiasis. Due for cycle soon but it doesn't feel exactly like that, but does report severe pain with menses. Doesn't think it's STD. No dysuria. Nausea.    Past Medical History:  Diagnosis Date  . Abnormal Pap smear    f/u wnl  . Asthma   . Fibroid   . Gestational diabetes   . Hypertension   . Morbid obesity ([place])   . Sleep apnea   . Urinary tract infection     Patient Active Problem List   Diagnosis Date Noted  . Fibroid uterus 09/17/2015  . Menorrhagia 08/27/2015  . Diabetes mellitus, type II ([place]) 09/13/2012  . BMI 60.0-69.9, adult ([place]) 09/13/2012  . Asthma 09/13/2012  . History of high blood pressure 09/13/2012  . OSA (obstructive sleep apnea) 12/10/2010    Past Surgical History:  Procedure Laterality Date  . CESAREAN SECTION    . DILATION AND CURETTAGE OF UTERUS    . TUBAL LIGATION      OB History    Gravida Para Term Preterm AB Living   4 2 1 1 2 2    SAB TAB Ectopic Multiple Live Births   2 0 0 1         Home Medications    Prior to Admission medications   Medication Sig Start Date End Date Taking? Authorizing Provider  albuterol (PROVENTIL HFA;VENTOLIN HFA) 108 (90 BASE) MCG/ACT inhaler Inhale 2-3 puffs into the lungs every 6 (six) hours as needed for wheezing. Asthma/SOB    Historical Provider, MD  ALPRAZolam (XANAX PO) Take  0.5 mg by mouth 2 (two) times daily.    Historical Provider, MD  budesonide-formoterol (SYMBICORT) 160-4.5 MCG/ACT inhaler Inhale 2 puffs into the lungs daily as needed (for shortness of breath).    Historical Provider, MD  ciprofloxacin (CIPRO) 500 MG tablet Take 1 tablet (500 mg total) by mouth every 12 (twelve) hours. Patient not taking: Reported on 08/27/2015 12/15/14   Virgel Manifold, MD  dicyclomine (BENTYL) 20 MG tablet Take 1 tablet (20 mg total) by mouth 3 (three) times daily as needed for spasms (abdominal pain). 05/20/16   Gareth Morgan, MD  EPINEPHrine (EPIPEN 2-PAK) 0.3 mg/0.3 mL IJ SOAJ injection Inject 0.3 mLs (0.3 mg total) into the muscle once as needed (for severe allergic reaction). CAll 911 immediately if you have to use this medicine 05/20/14   Harvie Heck, [name]  guaiFENesin-codeine 100-10 MG/5ML syrup Take 10 mLs by mouth every 6 (six) hours as needed for cough. Patient not taking: Reported on 08/27/2015 07/17/15   Veryl Speak, MD  HYDROcodone-acetaminophen (NORCO/VICODIN) 5-325 MG per tablet Take 2 tablets by mouth every 4 (four) hours as needed. Patient not taking: Reported on 08/27/2015 08/10/14   Fransico Meadow, [name]  metFORMIN (GLUCOPHAGE) 500 MG tablet Take by mouth 2 (two)  times daily with a meal.    Historical Provider, MD  norgestimate-ethinyl estradiol (ORTHO-CYCLEN,SPRINTEC,PREVIFEM) 0.25-35 MG-MCG tablet Take 1 tablet by mouth daily. 09/17/15   Woodroe Mode, MD  ondansetron (ZOFRAN ODT) 4 MG disintegrating tablet Take 1 tablet (4 mg total) by mouth every 8 (eight) hours as needed for nausea or vomiting. 05/20/16   Gareth Morgan, MD  OVER THE COUNTER MEDICATION Place 2 drops into both eyes daily. Reported on 08/27/2015    Historical Provider, MD  pantoprazole (PROTONIX) 20 MG tablet Take 1 tablet (20 mg total) by mouth 2 (two) times daily. 05/20/16 06/05/16  Gareth Morgan, MD  sitaGLIPtin-metformin (JANUMET) 50-500 MG per tablet Take 1 tablet by mouth 2 (two) times  daily with a meal.    Historical Provider, MD  traMADol (ULTRAM) 50 MG tablet Take 1 tablet (50 mg total) by mouth every 6 (six) hours as needed. Patient not taking: Reported on 08/27/2015 12/15/14   Virgel Manifold, MD    Family History Family History  Problem Relation Age of Onset  . Diabetes Father   . Allergies Mother   . Asthma Mother   . Hypertension Mother   . Diabetes Mother   . Diabetes Maternal Grandmother   . Heart disease Maternal Grandmother   . Anesthesia problems Neg Hx   . Hypotension Neg Hx   . Malignant hyperthermia Neg Hx   . Pseudochol deficiency Neg Hx   . Hearing loss Neg Hx     Social History Social History  Substance Use Topics  . Smoking status: Former Research scientist (life sciences)  . Smokeless tobacco: Never Used  . Alcohol use No     Comment: occasional     Allergies   Other and Penicillins   Review of Systems Review of Systems  Constitutional: Negative for fever.  HENT: Negative for sore throat.   Eyes: Negative for visual disturbance.  Respiratory: Negative for cough and shortness of breath.   Cardiovascular: Negative for chest pain.  Gastrointestinal: Positive for abdominal pain (suprapubic and towards left upper), diarrhea, nausea and vomiting. Negative for constipation.  Genitourinary: Positive for flank pain, frequency and urgency. Negative for difficulty urinating, dysuria, vaginal bleeding and vaginal discharge.  Musculoskeletal: Negative for back pain and neck pain.  Skin: Negative for rash.  Neurological: Negative for syncope and headaches.     Physical Exam Updated Vital Signs BP 109/61   Pulse 81   Temp 98.7 F (37.1 C)   Resp 18   Ht 5\' 6"  (1.676 m)   Wt (!) 340 lb (154.2 kg)   LMP 04/27/2016   SpO2 99%   BMI 54.88 kg/m   Physical Exam  Constitutional: She is oriented to person, place, and time. She appears well-developed and well-nourished. No distress.  HENT:  Head: Normocephalic and atraumatic.  Eyes: Conjunctivae and EOM are  normal.  Neck: Normal range of motion.  Cardiovascular: Normal rate, regular rhythm, normal heart sounds and intact distal pulses.  Exam reveals no gallop and no friction rub.   No murmur heard. Pulmonary/Chest: Effort normal and breath sounds normal. No respiratory distress. She has no wheezes. She has no rales.  Abdominal: Soft. She exhibits no distension. There is tenderness (LUQ, left lateral abdomen). There is no guarding, no CVA tenderness, no tenderness at McBurney's point and negative Murphy's sign.  Musculoskeletal: She exhibits no edema or tenderness.  Neurological: She is alert and oriented to person, place, and time.  Skin: Skin is warm and dry. No rash noted. She is not diaphoretic.  No erythema.  Nursing note and vitals reviewed.    ED Treatments / Results  Labs (all labs ordered are listed, but only abnormal results are displayed) Labs Reviewed  URINE CULTURE - Abnormal; Notable for the following:       Result Value   Culture   (*)    Value: <10,000 COLONIES/mL INSIGNIFICANT GROWTH Performed at [facility]    All other components within normal limits  URINALYSIS, ROUTINE W REFLEX MICROSCOPIC (NOT AT [facility]) - Abnormal; Notable for the following:    Glucose, UA >1000 (*)    All other components within normal limits  URINE MICROSCOPIC-ADD ON - Abnormal; Notable for the following:    Squamous Epithelial / LPF 0-5 (*)    Bacteria, UA RARE (*)    All other components within normal limits  CBG MONITORING, ED - Abnormal; Notable for the following:    Glucose-Capillary 193 (*)    All other components within normal limits  PREGNANCY, URINE    EKG  EKG Interpretation None       Radiology No results found.  Procedures Procedures (including critical care time)  Medications Ordered in ED Medications  ketorolac (TORADOL) injection 60 mg (60 mg Intramuscular Given 05/20/16 1508)  ondansetron (ZOFRAN-ODT) disintegrating tablet 4 mg (4 mg Oral Given 05/20/16  1507)     Initial Impression / Assessment and Plan / ED Course  I have reviewed the triage vital signs and the nursing notes.  Pertinent labs & imaging results that were available during my care of the patient were reviewed by me and considered in my medical decision making (see chart for details).  Clinical Course   35yo female with hx of DM, htn, fibroids, presents with concern for LUQ and lower abdominal pain. Urinalysis without infection. Pt declines testing for STIs/eval for PID. Given no discharge, no fevers, primarily higher pain doubt TOA. Pain is not colicky in nature and doubt ovarian torsion or nephrolithiasis. No ketones in urine, glucose 189, doubt DKA. No right sided pain or tenderness to suggest cholecystitis, appendicitis, no mid epigastric pain and doubt pancreatitis.  Possible gastritis with premenstrual symptoms. Gastropresis also on differential. PID is on differential however pt declines testing.  Discussed this with pt in detail, exam benign, doubt other acute pathology at this time. Gave rx for BID PPI, bentyl and zofran and recommend close PCP follow up and discussed reasons to return in detail.   Final Clinical Impressions(s) / ED Diagnoses   Final diagnoses:  Left upper quadrant pain, suspect gastritis    New Prescriptions Discharge Medication List as of 05/20/2016  4:52 PM    START taking these medications   Details  dicyclomine (BENTYL) 20 MG tablet Take 1 tablet (20 mg total) by mouth 3 (three) times daily as needed for spasms (abdominal pain)., Starting Tue 05/20/2016, Print    ondansetron (ZOFRAN ODT) 4 MG disintegrating tablet Take 1 tablet (4 mg total) by mouth every 8 (eight) hours as needed for nausea or vomiting., Starting Tue 05/20/2016, Print    pantoprazole (PROTONIX) 20 MG tablet Take 1 tablet (20 mg total) by mouth 2 (two) times daily., Starting Tue 05/20/2016, Until Thu 06/05/2016, Print         Gareth Morgan, MD 05/21/16 1731

## 2016-05-20 NOTE — ED Notes (Signed)
CBG is 193

## 2016-05-21 LAB — URINE CULTURE: Culture: <10000 — AB

## 2016-08-25 ENCOUNTER — Emergency Department (HOSPITAL_BASED_OUTPATIENT_CLINIC_OR_DEPARTMENT_OTHER): Payer: Managed Care, Other (non HMO)

## 2016-08-25 ENCOUNTER — Encounter (HOSPITAL_BASED_OUTPATIENT_CLINIC_OR_DEPARTMENT_OTHER): Payer: Self-pay | Admitting: *Deleted

## 2016-08-25 ENCOUNTER — Emergency Department (HOSPITAL_BASED_OUTPATIENT_CLINIC_OR_DEPARTMENT_OTHER)
Admission: EM | Admit: 2016-08-25 | Discharge: 2016-08-25 | Disposition: A | Discharge status: Home / Self Care | Payer: Managed Care, Other (non HMO) | Attending: Emergency Medicine | Admitting: Emergency Medicine

## 2016-08-25 DIAGNOSIS — Z87891 Personal history of nicotine dependence: Secondary | ICD-10-CM | POA: Diagnosis not present

## 2016-08-25 DIAGNOSIS — I10 Essential (primary) hypertension: Secondary | ICD-10-CM | POA: Diagnosis not present

## 2016-08-25 DIAGNOSIS — Z7984 Long term (current) use of oral hypoglycemic drugs: Secondary | ICD-10-CM | POA: Insufficient documentation

## 2016-08-25 DIAGNOSIS — R0602 Shortness of breath: Secondary | ICD-10-CM | POA: Insufficient documentation

## 2016-08-25 DIAGNOSIS — R69 Illness, unspecified: Secondary | ICD-10-CM

## 2016-08-25 DIAGNOSIS — J45909 Unspecified asthma, uncomplicated: Secondary | ICD-10-CM | POA: Insufficient documentation

## 2016-08-25 DIAGNOSIS — Z79899 Other long term (current) drug therapy: Secondary | ICD-10-CM | POA: Insufficient documentation

## 2016-08-25 DIAGNOSIS — E119 Type 2 diabetes mellitus without complications: Secondary | ICD-10-CM | POA: Insufficient documentation

## 2016-08-25 DIAGNOSIS — H6693 Otitis media, unspecified, bilateral: Secondary | ICD-10-CM | POA: Insufficient documentation

## 2016-08-25 DIAGNOSIS — R05 Cough: Secondary | ICD-10-CM | POA: Diagnosis present

## 2016-08-25 DIAGNOSIS — J111 Influenza due to unidentified influenza virus with other respiratory manifestations: Secondary | ICD-10-CM

## 2016-08-25 DIAGNOSIS — R110 Nausea: Secondary | ICD-10-CM | POA: Insufficient documentation

## 2016-08-25 DIAGNOSIS — R358 Other polyuria: Secondary | ICD-10-CM | POA: Diagnosis not present

## 2016-08-25 LAB — CBG MONITORING, ED: Glucose-Capillary: 118 mg/dL — ABNORMAL HIGH (ref 65–99)

## 2016-08-25 MED ORDER — HYDROCODONE-ACETAMINOPHEN 5-325 MG PO TABS: 1.0000 | ORAL_TABLET | ORAL | 0 refills | Status: DC | PRN

## 2016-08-25 MED ORDER — AZITHROMYCIN 250 MG PO TABS: 250.0000 mg | ORAL_TABLET | Freq: Every day | ORAL | 0 refills | Status: DC

## 2016-08-25 MED ORDER — OSELTAMIVIR PHOSPHATE 75 MG PO CAPS: 75.0000 mg | ORAL_CAPSULE | Freq: Two times a day (BID) | ORAL | 0 refills | Status: DC

## 2016-08-25 MED FILL — AZITHROMYCIN 250 MG TABLET: 250 | 5 days supply | Qty: 6 | Fill #0

## 2016-08-25 MED FILL — HYDROCODON-APAP 5-325: 5-325 | 2 days supply | Qty: 6 | Fill #0

## 2016-08-25 MED FILL — OSELTAMIVIR PHOS 75 MG CAP: 75 | 5 days supply | Qty: 10 | Fill #0

## 2016-08-25 NOTE — ED Notes (Signed)
ED Provider at bedside. 

## 2016-08-25 NOTE — ED Notes (Signed)
Pt assisted to position of comfort, lights dimmed per request, television channel changed.

## 2016-08-25 NOTE — ED Triage Notes (Signed)
Pt reports cough and congestion x Saturday, pt has hx of asthma, last used her inhaler a few weeks ago, when asked why she didn't use her inhaler this week, states "trying to be superwoman I guess..." dyspnea noted with exertion.

## 2016-08-25 NOTE — ED Provider Notes (Addendum)
[facility] Provider Note   CSN: [id number] Arrival date & time: 08/25/16  0859     History   Chief Complaint Chief Complaint  Patient presents with  . Cough    HPI [name] is a 36 y.o. female.  HPI Patient has history of asthma and diabetes. The last 2 days she's had a cough sore throat myalgias and feeling bad. States she's had fevers. Has had some nausea without vomiting. States she feels as if her sugars could be high. She's had a cough with mild sputum production. She aches all over. No diarrhea. Slight sore throat.   Past Medical History:  Diagnosis Date  . Abnormal Pap smear    f/u wnl  . Asthma   . Fibroid   . Gestational diabetes   . Hypertension   . Morbid obesity ([place])   . Sleep apnea   . Urinary tract infection     Patient Active Problem List   Diagnosis Date Noted  . Fibroid uterus 09/17/2015  . Menorrhagia 08/27/2015  . Diabetes mellitus, type II ([place]) 09/13/2012  . BMI 60.0-69.9, adult ([place]) 09/13/2012  . Asthma 09/13/2012  . History of high blood pressure 09/13/2012  . OSA (obstructive sleep apnea) 12/10/2010    Past Surgical History:  Procedure Laterality Date  . CESAREAN SECTION    . DILATION AND CURETTAGE OF UTERUS    . TUBAL LIGATION      OB History    Gravida Para Term Preterm AB Living   4 2 1 1 2 2    SAB TAB Ectopic Multiple Live Births   2 0 0 1         Home Medications    Prior to Admission medications   Medication Sig Start Date End Date Taking? Authorizing Provider  Liraglutide -Weight Management (SAXENDA [place]) Inject into the skin.   Yes Historical Provider, MD  saxagliptin HCl (ONGLYZA) 2.5 MG TABS tablet Take 2.5 mg by mouth daily.   Yes Historical Provider, MD  albuterol (PROVENTIL HFA;VENTOLIN HFA) 108 (90 BASE) MCG/ACT inhaler Inhale 2-3 puffs into the lungs every 6 (six) hours as needed for wheezing. Asthma/SOB    Historical Provider, MD  ALPRAZolam (XANAX PO) Take 0.5 mg by mouth 2 (two) times  daily.    Historical Provider, MD  azithromycin (ZITHROMAX) 250 MG tablet Take 1 tablet (250 mg total) by mouth daily. Take first 2 tablets together, then 1 every day until finished. 08/25/16   Davonna Belling, MD  dicyclomine (BENTYL) 20 MG tablet Take 1 tablet (20 mg total) by mouth 3 (three) times daily as needed for spasms (abdominal pain). 05/20/16   Gareth Morgan, MD  EPINEPHrine (EPIPEN 2-PAK) 0.3 mg/0.3 mL IJ SOAJ injection Inject 0.3 mLs (0.3 mg total) into the muscle once as needed (for severe allergic reaction). CAll 911 immediately if you have to use this medicine 05/20/14   Harvie Heck, [name]  HYDROcodone-acetaminophen (NORCO/VICODIN) 5-325 MG tablet Take 1-2 tablets by mouth every 4 (four) hours as needed. 08/25/16   Davonna Belling, MD  metFORMIN (GLUCOPHAGE) 500 MG tablet Take by mouth 2 (two) times daily with a meal.    Historical Provider, MD  oseltamivir (TAMIFLU) 75 MG capsule Take 1 capsule (75 mg total) by mouth every 12 (twelve) hours. 08/25/16   Davonna Belling, MD  OVER THE COUNTER MEDICATION Place 2 drops into both eyes daily. Reported on 08/27/2015    Historical Provider, MD  pantoprazole (PROTONIX) 20 MG tablet Take 1 tablet (20 mg  total) by mouth 2 (two) times daily. 05/20/16 06/05/16  Gareth Morgan, MD    Family History Family History  Problem Relation Age of Onset  . Diabetes Father   . Allergies Mother   . Asthma Mother   . Hypertension Mother   . Diabetes Mother   . Diabetes Maternal Grandmother   . Heart disease Maternal Grandmother   . Anesthesia problems Neg Hx   . Hypotension Neg Hx   . Malignant hyperthermia Neg Hx   . Pseudochol deficiency Neg Hx   . Hearing loss Neg Hx     Social History Social History  Substance Use Topics  . Smoking status: Former Research scientist (life sciences)  . Smokeless tobacco: Never Used  . Alcohol use No     Comment: occasional     Allergies   Other and Penicillins   Review of Systems Review of Systems  Constitutional: Positive  for appetite change.  HENT: Positive for congestion and sore throat.   Respiratory: Positive for cough and shortness of breath.   Cardiovascular: Negative for chest pain.  Gastrointestinal: Positive for nausea. Negative for abdominal pain and vomiting.  Endocrine: Positive for polyuria.  Genitourinary: Negative for dyspareunia.  Musculoskeletal: Positive for myalgias.  Skin: Negative for rash and wound.  Neurological: Positive for headaches.  Hematological: Negative for adenopathy.  Psychiatric/Behavioral: Negative for confusion.     Physical Exam Updated Vital Signs BP 117/61   Pulse 94   Temp 99.1 F (37.3 C) (Oral)   Resp 20   Ht 5\' 7"  (1.702 m)   Wt (!) 350 lb (158.8 kg)   LMP 08/04/2016   SpO2 99%   BMI 54.82 kg/m   Physical Exam  Constitutional: She is oriented to person, place, and time. She appears well-developed.  HENT:  Head: Atraumatic.  Mouth/Throat: No oropharyngeal exudate.  Posterior pharyngeal erythema without exudate.  Eyes: EOM are normal.  Neck: Neck supple.  Cardiovascular: Normal rate.   Pulmonary/Chest:  Mildly harsh breath sounds without frank wheezes.  Abdominal: Soft. There is no tenderness.  Neurological: She is alert and oriented to person, place, and time.  Skin: Skin is warm. Capillary refill takes less than 2 seconds.  Psychiatric: She has a normal mood and affect.     ED Treatments / Results  Labs (all labs ordered are listed, but only abnormal results are displayed) Labs Reviewed  CBG MONITORING, ED - Abnormal; Notable for the following:       Result Value   Glucose-Capillary 118 (*)    All other components within normal limits    EKG  EKG Interpretation None       Radiology Dg Chest 2 View  Result Date: 08/25/2016 CLINICAL DATA:  Two days of cough, shortness of breath, and body aches. History of asthma. Nonsmoker. EXAM: CHEST  2 VIEW COMPARISON:  PA and lateral chest x-ray of May 02, 2014 FINDINGS: The lungs are  adequately inflated. The interstitial markings are mildly prominent though stable. There is no alveolar infiltrate or pleural effusion. The heart and pulmonary vascularity are normal. IMPRESSION: There is no pneumonia nor other acute cardiopulmonary abnormality. Electronically Signed   By: David  Martinique M.D.   On: 08/25/2016 10:24    Procedures Procedures (including critical care time)  Medications Ordered in ED Medications - No data to display   Initial Impression / Assessment and Plan / ED Course  I have reviewed the triage vital signs and the nursing notes.  Pertinent labs & imaging results that were available during  my care of the patient were reviewed by me and considered in my medical decision making (see chart for details).     Patient with flulike symptoms cough. History of asthma. Lungs overall reassuring. Not hypoxic. Will treat as a flu. High risk due to asthma and diabetes. CBG reassuring. X-ray does not show pneumonia. Discharge home.  Final Clinical Impressions(s) / ED Diagnoses   Final diagnoses:  Influenza-like illness  Bilateral otitis media, unspecified otitis media type    New Prescriptions New Prescriptions   AZITHROMYCIN (ZITHROMAX) 250 MG TABLET    Take 1 tablet (250 mg total) by mouth daily. Take first 2 tablets together, then 1 every day until finished.   HYDROCODONE-ACETAMINOPHEN (NORCO/VICODIN) 5-325 MG TABLET    Take 1-2 tablets by mouth every 4 (four) hours as needed.   OSELTAMIVIR (TAMIFLU) 75 MG CAPSULE    Take 1 capsule (75 mg total) by mouth every 12 (twelve) hours.     Davonna Belling, MD 08/25/16 1325  After discharge patient states that appears when mom and her. States she has had some drainage out of the right ear. Bilateral TMs are erythematous and bulging, but no clear effusion. Will treat however with antibiotics pain and overall illness. We'll give azithromycin since patient is allergic to penicillins.   Davonna Belling, MD 08/25/16  [phone]

## 2016-08-25 NOTE — ED Notes (Signed)
Pt states she has had drainage from her ear. Concerned for OM. EDP Pickering notified and will evaluate.

## 2017-01-05 ENCOUNTER — Encounter (HOSPITAL_BASED_OUTPATIENT_CLINIC_OR_DEPARTMENT_OTHER): Payer: Self-pay | Admitting: Emergency Medicine

## 2017-01-05 ENCOUNTER — Emergency Department (HOSPITAL_BASED_OUTPATIENT_CLINIC_OR_DEPARTMENT_OTHER)
Admission: EM | Admit: 2017-01-05 | Discharge: 2017-01-05 | Disposition: A | Discharge status: Home / Self Care | Payer: Managed Care, Other (non HMO) | Attending: Emergency Medicine | Admitting: Emergency Medicine

## 2017-01-05 DIAGNOSIS — I10 Essential (primary) hypertension: Secondary | ICD-10-CM | POA: Insufficient documentation

## 2017-01-05 DIAGNOSIS — Z7984 Long term (current) use of oral hypoglycemic drugs: Secondary | ICD-10-CM | POA: Insufficient documentation

## 2017-01-05 DIAGNOSIS — F17200 Nicotine dependence, unspecified, uncomplicated: Secondary | ICD-10-CM | POA: Diagnosis not present

## 2017-01-05 DIAGNOSIS — Z79899 Other long term (current) drug therapy: Secondary | ICD-10-CM | POA: Diagnosis not present

## 2017-01-05 DIAGNOSIS — R112 Nausea with vomiting, unspecified: Secondary | ICD-10-CM | POA: Diagnosis present

## 2017-01-05 DIAGNOSIS — E119 Type 2 diabetes mellitus without complications: Secondary | ICD-10-CM | POA: Insufficient documentation

## 2017-01-05 DIAGNOSIS — J45909 Unspecified asthma, uncomplicated: Secondary | ICD-10-CM | POA: Diagnosis not present

## 2017-01-05 DIAGNOSIS — K529 Noninfective gastroenteritis and colitis, unspecified: Secondary | ICD-10-CM | POA: Diagnosis not present

## 2017-01-05 LAB — URINALYSIS, MICROSCOPIC (REFLEX)

## 2017-01-05 LAB — URINALYSIS, ROUTINE W REFLEX MICROSCOPIC
Glucose, UA: NEGATIVE mg/dL
Hgb urine dipstick: NEGATIVE
Ketones, ur: 15 mg/dL — AB
LEUKOCYTES UA: NEGATIVE
NITRITE: NEGATIVE
PROTEIN: 100 mg/dL — AB
Specific Gravity, Urine: 1.031 — ABNORMAL HIGH (ref 1.005–1.030)
pH: 6 (ref 5.0–8.0)

## 2017-01-05 LAB — CBC WITH DIFFERENTIAL/PLATELET
BASOS ABS: 0 10*3/uL (ref 0.0–0.1)
Basophils Relative: 0 %
Eosinophils Absolute: 0.3 10*3/uL (ref 0.0–0.7)
Eosinophils Relative: 3 %
HCT: 36.9 % (ref 36.0–46.0)
HEMOGLOBIN: 11.3 g/dL — AB (ref 12.0–15.0)
LYMPHS PCT: 20 %
Lymphs Abs: 1.9 10*3/uL (ref 0.7–4.0)
MCH: 23.3 pg — ABNORMAL LOW (ref 26.0–34.0)
MCHC: 30.6 g/dL (ref 30.0–36.0)
MCV: 76.2 fL — AB (ref 78.0–100.0)
Monocytes Absolute: 0.6 10*3/uL (ref 0.1–1.0)
Monocytes Relative: 6 %
NEUTROS PCT: 71 %
Neutro Abs: 6.8 10*3/uL (ref 1.7–7.7)
Platelets: 371 10*3/uL (ref 150–400)
RBC: 4.84 MIL/uL (ref 3.87–5.11)
RDW: 17.8 % — ABNORMAL HIGH (ref 11.5–15.5)
WBC: 9.5 10*3/uL (ref 4.0–10.5)

## 2017-01-05 LAB — COMPREHENSIVE METABOLIC PANEL
ALT: 14 U/L (ref 14–54)
AST: 19 U/L (ref 15–41)
Albumin: 3.9 g/dL (ref 3.5–5.0)
Alkaline Phosphatase: 62 U/L (ref 38–126)
Anion gap: 10 (ref 5–15)
BUN: 10 mg/dL (ref 6–20)
CHLORIDE: 106 mmol/L (ref 101–111)
CO2: 21 mmol/L — AB (ref 22–32)
CREATININE: 0.86 mg/dL (ref 0.44–1.00)
Calcium: 9.3 mg/dL (ref 8.9–10.3)
GFR calc Af Amer: >60 mL/min (ref >60)
Glucose, Bld: 194 mg/dL — ABNORMAL HIGH (ref 65–99)
Potassium: 3.6 mmol/L (ref 3.5–5.1)
SODIUM: 137 mmol/L (ref 135–145)
Total Bilirubin: 0.4 mg/dL (ref 0.3–1.2)
Total Protein: 7.8 g/dL (ref 6.5–8.1)

## 2017-01-05 LAB — LIPASE, BLOOD: Lipase: 45 U/L (ref 11–51)

## 2017-01-05 LAB — PREGNANCY, URINE: Preg Test, Ur: NEGATIVE

## 2017-01-05 LAB — CBG MONITORING, ED: Glucose-Capillary: 194 mg/dL — ABNORMAL HIGH (ref 65–99)

## 2017-01-05 MED ORDER — DICYCLOMINE HCL 20 MG PO TABS: 20.0000 mg | ORAL_TABLET | Freq: Two times a day (BID) | ORAL | 0 refills | Status: DC

## 2017-01-05 MED ORDER — DICYCLOMINE HCL 10 MG/ML IM SOLN
20.0000 mg | Freq: Once | INTRAMUSCULAR | Status: AC
  Administered 2017-01-05: 20 mg via INTRAMUSCULAR
  Filled 2017-01-05: qty 2

## 2017-01-05 MED ORDER — ONDANSETRON HCL 4 MG/2ML IJ SOLN
4.0000 mg | Freq: Once | INTRAMUSCULAR | Status: AC
  Administered 2017-01-05: 4 mg via INTRAVENOUS
  Filled 2017-01-05: qty 2

## 2017-01-05 MED ORDER — SODIUM CHLORIDE 0.9 % IV BOLUS (SEPSIS)
1000.0000 mL | Freq: Once | INTRAVENOUS | Status: AC
  Administered 2017-01-05: 1000 mL via INTRAVENOUS

## 2017-01-05 MED ORDER — ONDANSETRON 4 MG PO TBDP: 4.0000 mg | ORAL_TABLET | Freq: Three times a day (TID) | ORAL | 0 refills | Status: DC | PRN

## 2017-01-05 MED FILL — DICYCLOMINE 20 MG TABLET: 20 | 10 days supply | Qty: 20 | Fill #0

## 2017-01-05 MED FILL — ONDANSETRON ODT 4 MG TABLET: 4 | 6 days supply | Qty: 20 | Fill #0

## 2017-01-05 NOTE — ED Provider Notes (Signed)
[facility] Provider Note   CSN: [id number] Arrival date & time: 01/05/17  0847     History   Chief Complaint Chief Complaint  Patient presents with  . Emesis    HPI [name] is a 36 y.o. female.  HPI  Since Friday has had symptoms, worse on Sunday.  Vomiting at least 2-3 times per day, diarrhea 10 times per day at least. No black or bloody stools.  Some burning abdominal pain.  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  D   Past Medical History:  Diagnosis Date  . Abnormal Pap smear    f/u wnl  . Asthma   . Fibroid   . Gestational diabetes   . Hypertension   . Morbid obesity ([place])   . Sleep apnea   . Urinary tract infection     Patient Active Problem List   Diagnosis Date Noted  . Fibroid uterus 09/17/2015  . Menorrhagia 08/27/2015  . Diabetes mellitus, type II ([place]) 09/13/2012  . BMI 60.0-69.9, adult ([place]) 09/13/2012  . Asthma 09/13/2012  . History of high blood pressure 09/13/2012  . OSA (obstructive sleep apnea) 12/10/2010    Past Surgical History:  Procedure Laterality Date  . CESAREAN SECTION    . DILATION AND CURETTAGE OF UTERUS    . TUBAL LIGATION      OB History    Gravida Para Term Preterm AB Living   4 2 1 1 2 2    SAB TAB Ectopic Multiple Live Births   2 0 0 1         Home Medications    Prior to Admission medications   Medication Sig Start Date End Date Taking? Authorizing Provider  albuterol (PROVENTIL HFA;VENTOLIN HFA) 108 (90 BASE) MCG/ACT inhaler Inhale 2-3 puffs into the lungs every 6 (six) hours as needed for wheezing. Asthma/SOB   Yes [provider]  ALPRAZolam (XANAX PO) Take 0.5 mg by mouth 2 (two) times daily.   Yes [provider]  Liraglutide -Weight Management (SAXENDA [place]) Inject into the skin.   Yes [provider]  metFORMIN (GLUCOPHAGE) 500 MG tablet Take by mouth 2 (two) times daily with a meal.    Yes [provider]  dicyclomine (BENTYL) 20 MG tablet Take 1 tablet (20 mg total) by mouth 2 (two) times daily. 01/05/17   Gareth Morgan, MD  EPINEPHrine (EPIPEN 2-PAK) 0.3 mg/0.3 mL IJ SOAJ injection Inject 0.3 mLs (0.3 mg total) into the muscle once as needed (for severe allergic reaction). CAll 911 immediately if you have to use this medicine 05/20/14   Harvie Heck, [name]  ondansetron (ZOFRAN ODT) 4 MG disintegrating tablet Take 1 tablet (4 mg total) by mouth every 8 (eight) hours as needed for nausea or vomiting. 01/05/17   Gareth Morgan, MD  OVER THE COUNTER MEDICATION Place 2 drops into both eyes daily. Reported on 08/27/2015    [provider]  pantoprazole (PROTONIX) 20 MG tablet Take 1 tablet (20 mg total) by mouth 2 (two) times daily. 05/20/16 06/05/16  Gareth Morgan, MD  saxagliptin HCl (ONGLYZA) 2.5 MG TABS tablet Take 2.5 mg by mouth daily.    [provider]    Family History Family History  Problem Relation Age of Onset  . Diabetes Father   . Allergies Mother   . Asthma Mother   . Hypertension Mother   . Diabetes Mother   . Diabetes Maternal Grandmother   .  Heart disease Maternal Grandmother   . Anesthesia problems Neg Hx   . Hypotension Neg Hx   . Malignant hyperthermia Neg Hx   . Pseudochol deficiency Neg Hx   . Hearing loss Neg Hx     Social History Social History  Substance Use Topics  . Smoking status: Current Some Day Smoker  . Smokeless tobacco: Never Used  . Alcohol use No     Comment: occasional     Allergies   Other and Penicillins   Review of Systems Review of Systems  Constitutional: Negative for fever.  HENT: Negative for sore throat.   Eyes: Negative for visual disturbance.  Respiratory: Negative for cough and shortness of breath.   Cardiovascular: Negative for chest pain.  Gastrointestinal: Positive for abdominal pain (luq and diffuse), diarrhea, nausea and vomiting. Negative for blood in stool.    Genitourinary: Negative for difficulty urinating and dysuria.  Musculoskeletal: Negative for back pain and neck pain.  Skin: Negative for rash.  Neurological: Negative for syncope and headaches.     Physical Exam Updated Vital Signs BP 140/87 (BP Location: Left Arm)   Pulse 95   Temp 99.1 F (37.3 C) (Oral)   Resp 18   Ht 5\' 6"  (1.676 m)   Wt (!) 163.6 kg (360 lb 9.6 oz)   LMP 12/15/2016   SpO2 100%   BMI 58.20 kg/m   Physical Exam  Constitutional: She is oriented to person, place, and time. She appears well-developed and well-nourished. No distress.  HENT:  Head: Normocephalic and atraumatic.  Eyes: Conjunctivae and EOM are normal.  Neck: Normal range of motion.  Cardiovascular: Normal rate, regular rhythm, normal heart sounds and intact distal pulses.  Exam reveals no gallop and no friction rub.   No murmur heard. Pulmonary/Chest: Effort normal and breath sounds normal. No respiratory distress. She has no wheezes. She has no rales.  Abdominal: Soft. She exhibits no distension. There is tenderness (mild diffuse). There is no guarding.  Musculoskeletal: She exhibits no edema or tenderness.  Neurological: She is alert and oriented to person, place, and time.  Skin: Skin is warm and dry. No rash noted. She is not diaphoretic. No erythema.  Nursing note and vitals reviewed.    ED Treatments / Results  Labs (all labs ordered are listed, but only abnormal results are displayed) Labs Reviewed  URINALYSIS, ROUTINE W REFLEX MICROSCOPIC - Abnormal; Notable for the following:       Result Value   Color, Urine AMBER (*)    APPearance CLOUDY (*)    Specific Gravity, Urine 1.031 (*)    Bilirubin Urine SMALL (*)    Ketones, ur 15 (*)    Protein, ur 100 (*)    All other components within normal limits  CBC WITH DIFFERENTIAL/PLATELET - Abnormal; Notable for the following:    Hemoglobin 11.3 (*)    MCV 76.2 (*)    MCH 23.3 (*)    RDW 17.8 (*)    All other components within  normal limits  COMPREHENSIVE METABOLIC PANEL - Abnormal; Notable for the following:    CO2 21 (*)    Glucose, Bld 194 (*)    All other components within normal limits  URINALYSIS, MICROSCOPIC (REFLEX) - Abnormal; Notable for the following:    Bacteria, UA MANY (*)    Squamous Epithelial / LPF 0-5 (*)    All other components within normal limits  CBG MONITORING, ED - Abnormal; Notable for the following:    Glucose-Capillary 194 (*)  All other components within normal limits  PREGNANCY, URINE  LIPASE, BLOOD  CBG MONITORING, ED    EKG  EKG Interpretation None       Radiology No results found.  Procedures Procedures (including critical care time)  Medications Ordered in ED Medications  sodium chloride 0.9 % bolus 1,000 mL (0 mLs Intravenous Stopped 01/05/17 0958)  ondansetron (ZOFRAN) injection 4 mg (4 mg Intravenous Given 01/05/17 0915)  dicyclomine (BENTYL) injection 20 mg (20 mg Intramuscular Given 01/05/17 0957)     Initial Impression / Assessment and Plan / ED Course  I have reviewed the triage vital signs and the nursing notes.  Pertinent labs & imaging results that were available during my care of the patient were reviewed by me and considered in my medical decision making (see chart for details).    36 year old female with a history of diabetes, hypertension, morbid obesity presents with concern for nausea, vomiting and diarrhea. Her abdominal exam is benign, have low suspicion for appendicitis or cholecystitis. Labs show no sign of diabetic ketoacidosis. Urinalysis likely shows contamination with bacteria, no white blood cells, no urinary symptoms and doubt UTI. Urinalysis does appear consistent with dehydration. She has 15 ketones in the urine without glucose, and in setting of other labs are normal, doubt this is DKA and fields more likely mild starvation ketoacidosis.    Patient with likely viral gastroenteritis, less likely bacterial etiology. No history to suggest  C. difficile. She is given IV fluids and Zofran with improvement of symptoms. Recommended continued supportive care, hydration. Given a prescription for Zofran and Bentyl. Patient discharged in stable condition with understanding of reasons to return.   Final Clinical Impressions(s) / ED Diagnoses   Final diagnoses:  Gastroenteritis    New Prescriptions New Prescriptions   DICYCLOMINE (BENTYL) 20 MG TABLET    Take 1 tablet (20 mg total) by mouth 2 (two) times daily.   ONDANSETRON (ZOFRAN ODT) 4 MG DISINTEGRATING TABLET    Take 1 tablet (4 mg total) by mouth every 8 (eight) hours as needed for nausea or vomiting.     Gareth Morgan, MD 01/05/17 1016

## 2017-01-05 NOTE — ED Triage Notes (Signed)
Vomiting and diarrhea since Friday.

## 2017-02-02 ENCOUNTER — Encounter (HOSPITAL_BASED_OUTPATIENT_CLINIC_OR_DEPARTMENT_OTHER): Payer: Self-pay | Admitting: *Deleted

## 2017-02-02 ENCOUNTER — Emergency Department (HOSPITAL_BASED_OUTPATIENT_CLINIC_OR_DEPARTMENT_OTHER)
Admission: EM | Admit: 2017-02-02 | Discharge: 2017-02-02 | Disposition: A | Discharge status: Home / Self Care | Payer: Managed Care, Other (non HMO) | Attending: Emergency Medicine | Admitting: Emergency Medicine

## 2017-02-02 ENCOUNTER — Emergency Department (HOSPITAL_BASED_OUTPATIENT_CLINIC_OR_DEPARTMENT_OTHER): Payer: Managed Care, Other (non HMO)

## 2017-02-02 DIAGNOSIS — J45909 Unspecified asthma, uncomplicated: Secondary | ICD-10-CM | POA: Insufficient documentation

## 2017-02-02 DIAGNOSIS — N898 Other specified noninflammatory disorders of vagina: Secondary | ICD-10-CM

## 2017-02-02 DIAGNOSIS — F17210 Nicotine dependence, cigarettes, uncomplicated: Secondary | ICD-10-CM | POA: Diagnosis not present

## 2017-02-02 DIAGNOSIS — Z7984 Long term (current) use of oral hypoglycemic drugs: Secondary | ICD-10-CM | POA: Diagnosis not present

## 2017-02-02 DIAGNOSIS — Z79899 Other long term (current) drug therapy: Secondary | ICD-10-CM | POA: Diagnosis not present

## 2017-02-02 DIAGNOSIS — I10 Essential (primary) hypertension: Secondary | ICD-10-CM | POA: Insufficient documentation

## 2017-02-02 DIAGNOSIS — E119 Type 2 diabetes mellitus without complications: Secondary | ICD-10-CM | POA: Diagnosis not present

## 2017-02-02 DIAGNOSIS — R102 Pelvic and perineal pain: Secondary | ICD-10-CM

## 2017-02-02 DIAGNOSIS — N760 Acute vaginitis: Secondary | ICD-10-CM | POA: Insufficient documentation

## 2017-02-02 LAB — URINALYSIS, MICROSCOPIC (REFLEX)

## 2017-02-02 LAB — URINALYSIS, ROUTINE W REFLEX MICROSCOPIC
Bilirubin Urine: NEGATIVE
GLUCOSE, UA: 250 mg/dL — AB
HGB URINE DIPSTICK: NEGATIVE
Ketones, ur: NEGATIVE mg/dL
Nitrite: NEGATIVE
Protein, ur: NEGATIVE mg/dL
Specific Gravity, Urine: 1.027 (ref 1.005–1.030)
pH: 7 (ref 5.0–8.0)

## 2017-02-02 LAB — WET PREP, GENITAL
Sperm: NONE SEEN
TRICH WET PREP: NONE SEEN
YEAST WET PREP: NONE SEEN

## 2017-02-02 LAB — PREGNANCY, URINE: Preg Test, Ur: NEGATIVE

## 2017-02-02 MED ORDER — CEFTRIAXONE SODIUM 250 MG IJ SOLR
250.0000 mg | Freq: Once | INTRAMUSCULAR | Status: AC
  Administered 2017-02-02: 250 mg via INTRAMUSCULAR
  Filled 2017-02-02: qty 250

## 2017-02-02 MED ORDER — KETOROLAC TROMETHAMINE 30 MG/ML IJ SOLN
30.0000 mg | Freq: Once | INTRAMUSCULAR | Status: AC
Start: 2017-02-02 — End: 2017-02-02
  Administered 2017-02-02: 30 mg via INTRAMUSCULAR
  Filled 2017-02-02: qty 1

## 2017-02-02 MED ORDER — TRAMADOL HCL 50 MG PO TABS: 50.0000 mg | ORAL_TABLET | Freq: Four times a day (QID) | ORAL | 0 refills | Status: DC | PRN

## 2017-02-02 MED ORDER — METRONIDAZOLE 500 MG PO TABS: 500.0000 mg | ORAL_TABLET | Freq: Two times a day (BID) | ORAL | 0 refills | Status: DC

## 2017-02-02 MED ORDER — DOXYCYCLINE HYCLATE 100 MG PO CAPS: 100.0000 mg | ORAL_CAPSULE | Freq: Two times a day (BID) | ORAL | 0 refills | Status: DC

## 2017-02-02 NOTE — ED Triage Notes (Signed)
Vaginal discharge x 3 days. She was taking a shower this am and noticed blood coming from her vagina.

## 2017-02-02 NOTE — ED Notes (Signed)
Patient in ultrasound.

## 2017-02-02 NOTE — Discharge Instructions (Signed)
Unsure of your etiology of your symptoms. May be due to an infectious cause. We'll prescribe antibiotics including Flagyl and doxycycline. Take the Ultram as needed for pain. Continue using Motrin and Tylenol. Your urine shows no signs of infection. Make sure you follow-up with her OB/GYN doctor. Return to ED if he develops any fevers, worsening pain, worsening discharge or vomiting.

## 2017-02-03 LAB — GC/CHLAMYDIA PROBE AMP (~~LOC~~) NOT AT ARMC
CHLAMYDIA, DNA PROBE: NEGATIVE
NEISSERIA GONORRHEA: NEGATIVE

## 2017-02-03 LAB — URINE CULTURE: Culture: <10000 — AB

## 2017-02-03 NOTE — ED Provider Notes (Signed)
[facility] DEPT Provider Note   CSN: [id number] Arrival date & time: 02/02/17  1234     History   Chief Complaint Chief Complaint  Patient presents with  . Vaginal Discharge    HPI [name] is a 36 y.o. female.  HPI 36 year old African-American female with past medical history significant for diabetes and obesity presents to the emergency department today with complaints of vaginal discharge 3 days. Patient states that she does have a history of yeast infection, BV. She believes this may be due to yeast. She has tried Monistat over-the-counter with little relief. She also reports pelvic discomfort and sensation of swelling. Patient states that this morning she also noticed a little bit of vaginal bleeding. Patient states that she is sexually active. Unsure of STD contact. Would like testing for gonorrhea and chlamydia. Patient denies any abdominal pain. She does report some mild nausea but denies any emesis. She has known history of fibroids.  Pt denies any fever, chill, ha, vision changes, lightheadedness, dizziness, congestion, neck pain, cp, sob, cough, abd pain, n/v/d, urinary symptoms, change in bowel habits, melena, hematochezia, lower extremity paresthesias.  Past Medical History:  Diagnosis Date  . Abnormal Pap smear    f/u wnl  . Asthma   . Fibroid   . Gestational diabetes   . Hypertension   . Morbid obesity ([place])   . Sleep apnea   . Urinary tract infection     Patient Active Problem List   Diagnosis Date Noted  . Fibroid uterus 09/17/2015  . Menorrhagia 08/27/2015  . Diabetes mellitus, type II ([place]) 09/13/2012  . BMI 60.0-69.9, adult ([place]) 09/13/2012  . Asthma 09/13/2012  . History of high blood pressure 09/13/2012  . OSA (obstructive sleep apnea) 12/10/2010    Past Surgical History:  Procedure Laterality Date  . CESAREAN SECTION    . DILATION AND CURETTAGE OF UTERUS    . TUBAL LIGATION      OB History    Gravida Para Term Preterm AB Living     4 2 1 1 2 2    SAB TAB Ectopic Multiple Live Births   2 0 0 1         Home Medications    Prior to Admission medications   Medication Sig Start Date End Date Taking? Authorizing Provider  albuterol (PROVENTIL HFA;VENTOLIN HFA) 108 (90 BASE) MCG/ACT inhaler Inhale 2-3 puffs into the lungs every 6 (six) hours as needed for wheezing. Asthma/SOB    [provider]  ALPRAZolam (XANAX PO) Take 0.5 mg by mouth 2 (two) times daily.    [provider]  dicyclomine (BENTYL) 20 MG tablet Take 1 tablet (20 mg total) by mouth 2 (two) times daily. 01/05/17   Gareth Morgan, MD  doxycycline (VIBRAMYCIN) 100 MG capsule Take 1 capsule (100 mg total) by mouth 2 (two) times daily. 02/02/17   Doristine Devoid, [name]  EPINEPHrine (EPIPEN 2-PAK) 0.3 mg/0.3 mL IJ SOAJ injection Inject 0.3 mLs (0.3 mg total) into the muscle once as needed (for severe allergic reaction). CAll 911 immediately if you have to use this medicine 05/20/14   Harvie Heck, [name]  Liraglutide -Weight Management (SAXENDA [place]) Inject into the skin.    [provider]  metFORMIN (GLUCOPHAGE) 500 MG tablet Take by mouth 2 (two) times daily with a meal.    [provider]  metroNIDAZOLE (FLAGYL) 500 MG tablet Take 1 tablet (500 mg total) by mouth 2 (two) times daily with a meal. DO NOT CONSUME  ALCOHOL WHILE TAKING THIS MEDICATION. 02/02/17   Ocie Cornfield T, [name]  ondansetron (ZOFRAN ODT) 4 MG disintegrating tablet Take 1 tablet (4 mg total) by mouth every 8 (eight) hours as needed for nausea or vomiting. 01/05/17   Gareth Morgan, MD  OVER THE COUNTER MEDICATION Place 2 drops into both eyes daily. Reported on 08/27/2015    [provider]  pantoprazole (PROTONIX) 20 MG tablet Take 1 tablet (20 mg total) by mouth 2 (two) times daily. 05/20/16 06/05/16  Gareth Morgan, MD  saxagliptin HCl (ONGLYZA) 2.5 MG TABS tablet Take 2.5 mg by mouth daily.    [provider]  traMADol (ULTRAM) 50 MG  tablet Take 1 tablet (50 mg total) by mouth every 6 (six) hours as needed. 02/02/17   Doristine Devoid, [name]    Family History Family History  Problem Relation Age of Onset  . Diabetes Father   . Allergies Mother   . Asthma Mother   . Hypertension Mother   . Diabetes Mother   . Diabetes Maternal Grandmother   . Heart disease Maternal Grandmother   . Anesthesia problems Neg Hx   . Hypotension Neg Hx   . Malignant hyperthermia Neg Hx   . Pseudochol deficiency Neg Hx   . Hearing loss Neg Hx     Social History Social History  Substance Use Topics  . Smoking status: Current Some Day Smoker  . Smokeless tobacco: Never Used  . Alcohol use No     Comment: occasional     Allergies   Other and Penicillins   Review of Systems Review of Systems  Constitutional: Negative for chills and fever.  HENT: Negative for congestion.   Eyes: Negative for visual disturbance.  Respiratory: Negative for cough and shortness of breath.   Cardiovascular: Negative for chest pain.  Gastrointestinal: Negative for abdominal pain, diarrhea, nausea and vomiting.  Genitourinary: Positive for vaginal bleeding, vaginal discharge and vaginal pain. Negative for dysuria, flank pain, frequency, hematuria and urgency.  Musculoskeletal: Negative for arthralgias and myalgias.  Skin: Negative for rash.  Neurological: Negative for dizziness, syncope, weakness, light-headedness, numbness and headaches.  Psychiatric/Behavioral: Negative for sleep disturbance. The patient is not nervous/anxious.      Physical Exam Updated Vital Signs BP (!) 144/86   Pulse 93   Temp 98.4 F (36.9 C) (Oral)   Resp 20   Ht 5' 6.5" (1.689 m)   Wt (!) 163.3 kg (360 lb)   LMP 01/14/2017   SpO2 97%   BMI 57.24 kg/m   Physical Exam  Constitutional: She is oriented to person, place, and time. She appears well-developed and well-nourished.  Non-toxic appearance. No distress.  HENT:  Head: Normocephalic and atraumatic.    Nose: Nose normal.  Mouth/Throat: Oropharynx is clear and moist.  Eyes: Conjunctivae are normal. Pupils are equal, round, and reactive to light. Right eye exhibits no discharge. Left eye exhibits no discharge.  Neck: Normal range of motion. Neck supple.  Cardiovascular: Normal rate, regular rhythm, normal heart sounds and intact distal pulses.   Pulmonary/Chest: Effort normal and breath sounds normal. No respiratory distress. She exhibits no tenderness.  Abdominal: Soft. Bowel sounds are normal. There is no tenderness. There is no rebound and no guarding.  Genitourinary:  Genitourinary Comments: Chaperone present for exam. No external lesions, swelling, erythema, or rash of the labia. Mild erythema and edemea of the vaginal wall. and thin white discharge. No bleeding, or lesions noted in the vaginal vault. CMT tenderness, No bleeding or friability.  No adnexal tenderness, mass or fullness bilaterally. No inguinal adenopathy or hernia.    Musculoskeletal: Normal range of motion. She exhibits no tenderness.  Lymphadenopathy:    She has no cervical adenopathy.  Neurological: She is alert and oriented to person, place, and time.  Skin: Skin is warm and dry. Capillary refill takes less than 2 seconds.  Psychiatric: Her behavior is normal. Judgment and thought content normal.  Nursing note and vitals reviewed.    ED Treatments / Results  Labs (all labs ordered are listed, but only abnormal results are displayed) Labs Reviewed  WET PREP, GENITAL - Abnormal; Notable for the following:       Result Value   Clue Cells Wet Prep HPF POC PRESENT (*)    WBC, Wet Prep HPF POC MODERATE (*)    All other components within normal limits  URINE CULTURE - Abnormal; Notable for the following:    Culture   (*)    Value: <10,000 COLONIES/mL INSIGNIFICANT GROWTH Performed at [facility] [address], [place], [place] [address]    All other components within normal limits  URINALYSIS, ROUTINE W  REFLEX MICROSCOPIC - Abnormal; Notable for the following:    Glucose, UA 250 (*)    Leukocytes, UA SMALL (*)    All other components within normal limits  URINALYSIS, MICROSCOPIC (REFLEX) - Abnormal; Notable for the following:    Bacteria, UA FEW (*)    Squamous Epithelial / LPF 0-5 (*)    All other components within normal limits  PREGNANCY, URINE  GC/CHLAMYDIA PROBE AMP ([place]) NOT AT [facility]    EKG  EKG Interpretation None       Radiology US Transvaginal Non-ob  Result Date: 02/02/2017 CLINICAL DATA:  Pelvic pain for 4 days. EXAM: TRANSABDOMINAL AND TRANSVAGINAL ULTRASOUND OF PELVIS TECHNIQUE: Both transabdominal and transvaginal ultrasound examinations of the pelvis were performed. Transabdominal technique was performed for global imaging of the pelvis including uterus, ovaries, adnexal regions, and pelvic cul-de-sac. It was necessary to proceed with endovaginal exam following the transabdominal exam to visualize the bilateral ovaries. COMPARISON:  None FINDINGS: Uterus Measurements: 10 x 5.5 x 5.8 cm. Several hypoechoic lesions are identified in the uterine myometrium, largest is exophytic off of the fundus of the uterus measuring 2.8 x 2.8 x 2.9 cm. These are consistent with uterine fibroids. Endometrium Thickness: 1 mm.  No focal abnormality visualized. Right ovary Not visualized. Left ovary Not visualized. Other findings No abnormal free fluid. IMPRESSION: Uterine fibroids. Bilateral ovaries are not visualized on transabdominal and transvaginal pelvic ultrasound. Electronically Signed   By: Abelardo Diesel M.D.   On: 02/02/2017 17:29   US Pelvis Complete  Result Date: 02/02/2017 CLINICAL DATA:  Pelvic pain for 4 days. EXAM: TRANSABDOMINAL AND TRANSVAGINAL ULTRASOUND OF PELVIS TECHNIQUE: Both transabdominal and transvaginal ultrasound examinations of the pelvis were performed. Transabdominal technique was performed for global imaging of the pelvis including uterus, ovaries, adnexal  regions, and pelvic cul-de-sac. It was necessary to proceed with endovaginal exam following the transabdominal exam to visualize the bilateral ovaries. COMPARISON:  None FINDINGS: Uterus Measurements: 10 x 5.5 x 5.8 cm. Several hypoechoic lesions are identified in the uterine myometrium, largest is exophytic off of the fundus of the uterus measuring 2.8 x 2.8 x 2.9 cm. These are consistent with uterine fibroids. Endometrium Thickness: 1 mm.  No focal abnormality visualized. Right ovary Not visualized. Left ovary Not visualized. Other findings No abnormal free fluid. IMPRESSION: Uterine fibroids. Bilateral ovaries are not  visualized on transabdominal and transvaginal pelvic ultrasound. Electronically Signed   By: Abelardo Diesel M.D.   On: 02/02/2017 17:29    Procedures Procedures (including critical care time)  Medications Ordered in ED Medications  ketorolac (TORADOL) 30 MG/ML injection 30 mg (30 mg Intramuscular Given 02/02/17 1608)  cefTRIAXone (ROCEPHIN) injection 250 mg (250 mg Intramuscular Given 02/02/17 1800)     Initial Impression / Assessment and Plan / ED Course  I have reviewed the triage vital signs and the nursing notes.  Pertinent labs & imaging results that were available during my care of the patient were reviewed by me and considered in my medical decision making (see chart for details).     Patient presents to the emergency room today with complaints of vaginal discharge, vaginal discomfort for the past 3 days. History of yeast infections. Has been self treating herself with Monistat. Patient does report some vaginal bleeding today. Vital signs are stable. Overall she is well-appearing and nontoxic. She is afebrile. On exam patient has no abdominal pain. She does have some discharge and cervical motion tenderness on pelvic exam. Ultrasound was obtained and able to visualize ovaries due to body habitus. No gross abscess noted. Patient is afebrile. Doubt tubo-ovarian abscess given  bilateral tenderness. For the patient's symptoms are more likely due to cervicitis and vaginitis. Gonorrhea and chlamydia cultures are pending. UA shows no signs of infection. Will start patient on PID treatment. Patient will need close follow-up with her primary care and OB/GYN doctor.  Pt is hemodynamically stable, in NAD, & able to ambulate in the ED. Evaluation does not show pathology that would require ongoing emergent intervention or inpatient treatment. I explained the diagnosis to the patient. Pain has been managed & has no complaints prior to dc. Pt is comfortable with above plan and is stable for discharge at this time. All questions were answered prior to disposition. Strict return precautions for f/u to the ED were discussed. Encouraged follow up with PCP. Dicussed with Dr. Rex Kras who is agreeable to the above plan.  Final Clinical Impressions(s) / ED Diagnoses   Final diagnoses:  Vaginal discharge  Pelvic pain  Acute vaginitis    New Prescriptions Discharge Medication List as of 02/02/2017  5:49 PM    START taking these medications   Details  doxycycline (VIBRAMYCIN) 100 MG capsule Take 1 capsule (100 mg total) by mouth 2 (two) times daily., Starting Mon 02/02/2017, Print    metroNIDAZOLE (FLAGYL) 500 MG tablet Take 1 tablet (500 mg total) by mouth 2 (two) times daily with a meal. DO NOT CONSUME ALCOHOL WHILE TAKING THIS MEDICATION., Starting Mon 02/02/2017, Print    traMADol (ULTRAM) 50 MG tablet Take 1 tablet (50 mg total) by mouth every 6 (six) hours as needed., Starting Mon 02/02/2017, Print         Doristine Devoid, [name] 02/03/17 1733    Little, Wenda Overland, MD 02/06/17 [phone]

## 2018-03-25 ENCOUNTER — Emergency Department (HOSPITAL_BASED_OUTPATIENT_CLINIC_OR_DEPARTMENT_OTHER)
Admission: EM | Admit: 2018-03-25 | Discharge: 2018-03-25 | Disposition: A | Discharge status: Home / Self Care | Payer: Managed Care, Other (non HMO) | Attending: Emergency Medicine | Admitting: Emergency Medicine

## 2018-03-25 ENCOUNTER — Other Ambulatory Visit: Payer: Self-pay

## 2018-03-25 ENCOUNTER — Encounter (HOSPITAL_BASED_OUTPATIENT_CLINIC_OR_DEPARTMENT_OTHER): Payer: Self-pay | Admitting: Emergency Medicine

## 2018-03-25 DIAGNOSIS — F17200 Nicotine dependence, unspecified, uncomplicated: Secondary | ICD-10-CM | POA: Insufficient documentation

## 2018-03-25 DIAGNOSIS — N764 Abscess of vulva: Secondary | ICD-10-CM | POA: Diagnosis not present

## 2018-03-25 DIAGNOSIS — Z7984 Long term (current) use of oral hypoglycemic drugs: Secondary | ICD-10-CM | POA: Diagnosis not present

## 2018-03-25 DIAGNOSIS — E119 Type 2 diabetes mellitus without complications: Secondary | ICD-10-CM | POA: Insufficient documentation

## 2018-03-25 DIAGNOSIS — N898 Other specified noninflammatory disorders of vagina: Secondary | ICD-10-CM | POA: Diagnosis present

## 2018-03-25 DIAGNOSIS — R61 Generalized hyperhidrosis: Secondary | ICD-10-CM | POA: Diagnosis not present

## 2018-03-25 DIAGNOSIS — J45909 Unspecified asthma, uncomplicated: Secondary | ICD-10-CM | POA: Insufficient documentation

## 2018-03-25 DIAGNOSIS — Z79899 Other long term (current) drug therapy: Secondary | ICD-10-CM | POA: Insufficient documentation

## 2018-03-25 DIAGNOSIS — B373 Candidiasis of vulva and vagina: Secondary | ICD-10-CM | POA: Diagnosis not present

## 2018-03-25 DIAGNOSIS — B3731 Acute candidiasis of vulva and vagina: Secondary | ICD-10-CM

## 2018-03-25 DIAGNOSIS — I10 Essential (primary) hypertension: Secondary | ICD-10-CM | POA: Diagnosis not present

## 2018-03-25 LAB — WET PREP, GENITAL
Clue Cells Wet Prep HPF POC: NONE SEEN
Sperm: NONE SEEN
Trich, Wet Prep: NONE SEEN

## 2018-03-25 LAB — PREGNANCY, URINE: Preg Test, Ur: NEGATIVE

## 2018-03-25 MED ORDER — DOXYCYCLINE HYCLATE 100 MG PO CAPS: 100.0000 mg | ORAL_CAPSULE | Freq: Two times a day (BID) | ORAL | 0 refills | Status: DC

## 2018-03-25 MED ORDER — CLOTRIMAZOLE 1 % VA CREA: 1.0000 | TOPICAL_CREAM | Freq: Every day | VAGINAL | 0 refills | Status: DC

## 2018-03-25 MED ORDER — FLUCONAZOLE 150 MG PO TABS: 150.0000 mg | ORAL_TABLET | Freq: Every day | ORAL | 0 refills | Status: AC

## 2018-03-25 MED ORDER — LIDOCAINE HCL (PF) 1 % IJ SOLN
5.0000 mL | Freq: Once | INTRAMUSCULAR | Status: AC
  Administered 2018-03-25: 5 mL
  Filled 2018-03-25: qty 5

## 2018-03-25 MED ORDER — DOXYCYCLINE HYCLATE 100 MG PO TABS
100.0000 mg | ORAL_TABLET | Freq: Once | ORAL | Status: AC
  Administered 2018-03-25: 100 mg via ORAL
  Filled 2018-03-25: qty 1

## 2018-03-25 MED ORDER — FLUCONAZOLE 50 MG PO TABS
150.0000 mg | ORAL_TABLET | Freq: Once | ORAL | Status: AC
  Administered 2018-03-25: 150 mg via ORAL
  Filled 2018-03-25: qty 1

## 2018-03-25 NOTE — ED Provider Notes (Addendum)
[facility] EMERGENCY DEPARTMENT Provider Note   CSN: [id number] Arrival date & time: 03/25/18  1733     History   Chief Complaint Chief Complaint  Patient presents with  . Vaginitis  . Abscess    HPI [name] is a 37 y.o. female with history of diabetes, obesity had presents with a few day history of vaginal discharge with a foul odor and itching and a one-week history of abscess to right labia.  Patient has history of same.  She has had associated intermittent sweating, although she states this could be related to her metformin.  She also reports some mild nausea.  She denies any abdominal pain, nausea, vomiting.  She has had some hesitancy with urination.  She did not try any medications at home for symptoms.  She denies any chest pain, shortness of breath, abnormal vaginal bleeding.  She denies any concern for STD exposure.  She denies any new sexual partners.  HPI  Past Medical History:  Diagnosis Date  . Abnormal Pap smear    f/u wnl  . Asthma   . Fibroid   . Gestational diabetes   . Hypertension   . Morbid obesity ([place])   . Sleep apnea   . Urinary tract infection     Patient Active Problem List   Diagnosis Date Noted  . Fibroid uterus 09/17/2015  . Menorrhagia 08/27/2015  . Diabetes mellitus, type II ([place]) 09/13/2012  . BMI 60.0-69.9, adult ([place]) 09/13/2012  . Asthma 09/13/2012  . History of high blood pressure 09/13/2012  . OSA (obstructive sleep apnea) 12/10/2010    Past Surgical History:  Procedure Laterality Date  . CESAREAN SECTION    . DILATION AND CURETTAGE OF UTERUS    . TUBAL LIGATION       OB History    Gravida  4   Para  2   Term  1   Preterm  1   AB  2   Living  2     SAB  2   TAB  0   Ectopic  0   Multiple  1   Live Births               Home Medications    Prior to Admission medications   Medication Sig Start Date End Date Taking? Authorizing Provider  albuterol (PROVENTIL HFA;VENTOLIN HFA) 108  (90 BASE) MCG/ACT inhaler Inhale 2-3 puffs into the lungs every 6 (six) hours as needed for wheezing. Asthma/SOB    [provider]  ALPRAZolam (XANAX PO) Take 0.5 mg by mouth 2 (two) times daily.    [provider]  clotrimazole (GYNE-LOTRIMIN) 1 % vaginal cream Place 1 Applicatorful vaginally at bedtime. 03/25/18   Josey Dettmann, Bea Graff, [name]  dicyclomine (BENTYL) 20 MG tablet Take 1 tablet (20 mg total) by mouth 2 (two) times daily. 01/05/17   Gareth Morgan, MD  doxycycline (VIBRAMYCIN) 100 MG capsule Take 1 capsule (100 mg total) by mouth 2 (two) times daily. 03/25/18   Frantz Quattrone, Bea Graff, [name]  EPINEPHrine (EPIPEN 2-PAK) 0.3 mg/0.3 mL IJ SOAJ injection Inject 0.3 mLs (0.3 mg total) into the muscle once as needed (for severe allergic reaction). CAll 911 immediately if you have to use this medicine 05/20/14   Harvie Heck, [name]  fluconazole (DIFLUCAN) 150 MG tablet Take 1 tablet (150 mg total) by mouth daily for 2 doses. Take first dose and if symptoms not improved in 3 days, take second dose. 03/25/18 03/27/18  Eliezer Mccoy  M, [name]  Liraglutide -Weight Management (SAXENDA [place]) Inject into the skin.    [provider]  metFORMIN (GLUCOPHAGE) 500 MG tablet Take by mouth 2 (two) times daily with a meal.    [provider]  metroNIDAZOLE (FLAGYL) 500 MG tablet Take 1 tablet (500 mg total) by mouth 2 (two) times daily with a meal. DO NOT CONSUME ALCOHOL WHILE TAKING THIS MEDICATION. 02/02/17   Ocie Cornfield T, [name]  ondansetron (ZOFRAN ODT) 4 MG disintegrating tablet Take 1 tablet (4 mg total) by mouth every 8 (eight) hours as needed for nausea or vomiting. 01/05/17   Gareth Morgan, MD  OVER THE COUNTER MEDICATION Place 2 drops into both eyes daily. Reported on 08/27/2015    [provider]  pantoprazole (PROTONIX) 20 MG tablet Take 1 tablet (20 mg total) by mouth 2 (two) times daily. 05/20/16 06/05/16  Gareth Morgan, MD  saxagliptin HCl (ONGLYZA) 2.5 MG TABS  tablet Take 2.5 mg by mouth daily.    [provider]  traMADol (ULTRAM) 50 MG tablet Take 1 tablet (50 mg total) by mouth every 6 (six) hours as needed. 02/02/17   Doristine Devoid, [name]    Family History Family History  Problem Relation Age of Onset  . Diabetes Father   . Allergies Mother   . Asthma Mother   . Hypertension Mother   . Diabetes Mother   . Diabetes Maternal Grandmother   . Heart disease Maternal Grandmother   . Anesthesia problems Neg Hx   . Hypotension Neg Hx   . Malignant hyperthermia Neg Hx   . Pseudochol deficiency Neg Hx   . Hearing loss Neg Hx     Social History Social History   Tobacco Use  . Smoking status: Current Some Day Smoker  . Smokeless tobacco: Never Used  Substance Use Topics  . Alcohol use: No    Comment: occasional  . Drug use: No     Allergies   Other and Penicillins   Review of Systems Review of Systems  Constitutional: Negative for chills and fever.  HENT: Negative for facial swelling and sore throat.   Respiratory: Negative for shortness of breath.   Cardiovascular: Negative for chest pain.  Gastrointestinal: Negative for abdominal pain, nausea and vomiting.  Genitourinary: Positive for dysuria and vaginal discharge. Negative for vaginal bleeding.  Musculoskeletal: Negative for back pain.  Skin: Positive for wound (abscess). Negative for rash.  Neurological: Negative for headaches.  Psychiatric/Behavioral: The patient is not nervous/anxious.      Physical Exam Updated Vital Signs BP 127/73 (BP Location: Left Arm)   Pulse 90   Temp 98.5 F (36.9 C)   Resp 18   Ht 5' 6.5" (1.689 m)   Wt (!) 163.3 kg   LMP 03/04/2018 (Approximate)   SpO2 100%   BMI 57.24 kg/m   Physical Exam  Constitutional: She appears well-developed and well-nourished. No distress.  HENT:  Head: Normocephalic and atraumatic.  Mouth/Throat: Oropharynx is clear and moist. No oropharyngeal exudate.  Eyes: Pupils are equal, round, and  reactive to light. Conjunctivae are normal. Right eye exhibits no discharge. Left eye exhibits no discharge. No scleral icterus.  Neck: Normal range of motion. Neck supple. No thyromegaly present.  Cardiovascular: Normal rate, regular rhythm, normal heart sounds and intact distal pulses. Exam reveals no gallop and no friction rub.  No murmur heard. Pulmonary/Chest: Effort normal and breath sounds normal. No stridor. No respiratory distress. She has no wheezes. She has no rales.  Abdominal:  Soft. Bowel sounds are normal. She exhibits no distension. There is no tenderness. There is no rebound and no guarding.  Genitourinary:    Uterus is not tender. Cervix exhibits no motion tenderness and no friability. Right adnexum displays no tenderness. Left adnexum displays no tenderness. Vaginal discharge (cottage cheese like, white) found.  Musculoskeletal: She exhibits no edema.  Lymphadenopathy:    She has no cervical adenopathy.  Neurological: She is alert. Coordination normal.  Skin: Skin is warm and dry. No rash noted. She is not diaphoretic. No pallor.  Psychiatric: She has a normal mood and affect.  Nursing note and vitals reviewed.    ED Treatments / Results  Labs (all labs ordered are listed, but only abnormal results are displayed) Labs Reviewed  WET PREP, GENITAL - Abnormal; Notable for the following components:      Result Value   Yeast Wet Prep HPF POC PRESENT (*)    WBC, Wet Prep HPF POC MANY (*)    All other components within normal limits  PREGNANCY, URINE  GC/CHLAMYDIA PROBE AMP () NOT AT [facility]    EKG None  Radiology No results found.  Procedures .Marland KitchenIncision and Drainage Date/Time: 03/25/2018 10:45 PM Performed by: Frederica Kuster, [name] Authorized by: Frederica Kuster, [name]   Consent:    Consent obtained:  Verbal   Consent given by:  Patient   Risks discussed:  Bleeding, incomplete drainage, infection and pain   Alternatives discussed:  No  treatment Location:    Type:  Abscess   Location:  Anogenital   Anogenital location:  [place] Pre-procedure details:    Skin preparation:  Betadine Anesthesia (see MAR for exact dosages):    Anesthesia method:  Local infiltration   Local anesthetic:  Lidocaine 1% w/o epi Procedure type:    Complexity:  Simple Procedure details:    Needle aspiration: no     Incision types:  Cruciate   Incision depth:  Dermal   Scalpel blade:  11   Wound management:  Probed and deloculated and irrigated with saline   Drainage:  Bloody and purulent   Drainage amount:  Moderate   Wound treatment:  Wound left open   Packing materials:  None Post-procedure details:    Patient tolerance of procedure:  Tolerated well, no immediate complications   (including critical care time)  Medications Ordered in ED Medications  lidocaine (PF) (XYLOCAINE) 1 % injection 5 mL (5 mLs Infiltration Given 03/25/18 1823)  fluconazole (DIFLUCAN) tablet 150 mg (150 mg Oral Given 03/25/18 1953)  doxycycline (VIBRA-TABS) tablet 100 mg (100 mg Oral Given 03/25/18 1953)     Initial Impression / Assessment and Plan / ED Course  I have reviewed the triage vital signs and the nursing notes.  Pertinent labs & imaging results that were available during my care of the patient were reviewed by me and considered in my medical decision making (see chart for details).     Patient with labial abscess which was incised and drained in the ED.  Patient had good relief after procedure.  Patient also with vaginal candidiasis.  Will cover her abscess and surrounding cellulitis with doxycycline and vaginal candidiasis with Diflucan.  Patient requests a topical treatment as well and will be given vaginal Lotrimin.  Patient advised to return in 2 days for wound recheck and given reasons to return sooner.  GC/chlamydia sent and pending.  Urine pregnancy negative.  Patient did not give enough urine to test UA and did not want to  stay to check a UA as  she did not have to urinate anymore.  Patient understands and agrees with plan.  Patient vitals stable throughout ED course and discharged in satisfactory condition.  Final Clinical Impressions(s) / ED Diagnoses   Final diagnoses:  Candida vaginitis  Abscess of right genital labia    ED Discharge Orders         Ordered    doxycycline (VIBRAMYCIN) 100 MG capsule  2 times daily     03/25/18 1930    fluconazole (DIFLUCAN) 150 MG tablet  Daily     03/25/18 1930    clotrimazole (GYNE-LOTRIMIN) 1 % vaginal cream  Daily at bedtime     03/25/18 1946               Frederica Kuster, [name] 03/25/18 [place], DO 03/25/18 2319

## 2018-03-25 NOTE — Discharge Instructions (Addendum)
Medications: Diflucan, doxycycline, clotrimazole  Treatment: Repeat Diflucan dose if not improved after 3 days of receiving your dose in the emergency department.  Take doxycycline until completed for your abscess. Keep your wound clean and dry.  Wash with warm soapy water.  Apply clotrimazole cream vaginally for symptomatic treatment and as needed for 1 week at night.  Follow-up: Please return to the emergency department or see your doctor in 2 days for wound recheck or sooner if you develop any increasing pain, redness, swelling, red streaking from the wound, fevers, or any other concerning symptom.

## 2018-03-25 NOTE — ED Triage Notes (Signed)
Reports yeast infection to vagina.  Also reports ingrown hair which has turned into abscess.

## 2018-03-25 NOTE — ED Notes (Signed)
Pt knows that we still need more urine. The first one she [place] was not enough. RN Rod Holler informed

## 2018-03-26 LAB — GC/CHLAMYDIA PROBE AMP (~~LOC~~) NOT AT ARMC
Chlamydia: NEGATIVE
Neisseria Gonorrhea: NEGATIVE

## 2018-04-18 ENCOUNTER — Emergency Department (HOSPITAL_BASED_OUTPATIENT_CLINIC_OR_DEPARTMENT_OTHER)
Admission: EM | Admit: 2018-04-18 | Discharge: 2018-04-18 | Disposition: A | Discharge status: Home / Self Care | Payer: Managed Care, Other (non HMO) | Attending: Emergency Medicine | Admitting: Emergency Medicine

## 2018-04-18 ENCOUNTER — Encounter (HOSPITAL_BASED_OUTPATIENT_CLINIC_OR_DEPARTMENT_OTHER): Payer: Self-pay | Admitting: *Deleted

## 2018-04-18 ENCOUNTER — Other Ambulatory Visit: Payer: Self-pay

## 2018-04-18 DIAGNOSIS — J45909 Unspecified asthma, uncomplicated: Secondary | ICD-10-CM | POA: Diagnosis not present

## 2018-04-18 DIAGNOSIS — Z7984 Long term (current) use of oral hypoglycemic drugs: Secondary | ICD-10-CM | POA: Insufficient documentation

## 2018-04-18 DIAGNOSIS — Z79899 Other long term (current) drug therapy: Secondary | ICD-10-CM | POA: Diagnosis not present

## 2018-04-18 DIAGNOSIS — J0100 Acute maxillary sinusitis, unspecified: Secondary | ICD-10-CM | POA: Diagnosis not present

## 2018-04-18 DIAGNOSIS — R0602 Shortness of breath: Secondary | ICD-10-CM | POA: Diagnosis present

## 2018-04-18 DIAGNOSIS — J40 Bronchitis, not specified as acute or chronic: Secondary | ICD-10-CM | POA: Diagnosis not present

## 2018-04-18 DIAGNOSIS — E119 Type 2 diabetes mellitus without complications: Secondary | ICD-10-CM | POA: Diagnosis not present

## 2018-04-18 DIAGNOSIS — I10 Essential (primary) hypertension: Secondary | ICD-10-CM | POA: Diagnosis not present

## 2018-04-18 DIAGNOSIS — F17200 Nicotine dependence, unspecified, uncomplicated: Secondary | ICD-10-CM | POA: Diagnosis not present

## 2018-04-18 MED ORDER — PREDNISONE 50 MG PO TABS
60.0000 mg | ORAL_TABLET | Freq: Once | ORAL | Status: AC
  Administered 2018-04-18: 60 mg via ORAL
  Filled 2018-04-18: qty 1

## 2018-04-18 MED ORDER — AZITHROMYCIN 250 MG PO TABS: 250.0000 mg | ORAL_TABLET | Freq: Every day | ORAL | 0 refills | Status: DC

## 2018-04-18 MED ORDER — PREDNISONE 10 MG PO TABS: 40.0000 mg | ORAL_TABLET | Freq: Every day | ORAL | 0 refills | Status: DC

## 2018-04-18 NOTE — ED Triage Notes (Signed)
Coughing, shortness of breath since last Tuesday and lightheaded since yesterday.

## 2018-04-18 NOTE — ED Provider Notes (Signed)
[facility] EMERGENCY DEPARTMENT Provider Note   CSN: [id number] Arrival date & time: 04/18/18  1130     History   Chief Complaint Chief Complaint  Patient presents with  . SOB, Cough    HPI [name] is a 37 y.o. female.  Patient with sick exposure.  Patient with a complaint of cough some shortness of breath.  Sinus pressure little lightheadedness.  patient states symptoms started on Friday.  Patient has had similar symptoms in the past related to sinusitis and bronchitis.  Patient uses an inhaler at home.  Not wheezing currently but has had some wheezing.     Past Medical History:  Diagnosis Date  . Abnormal Pap smear    f/u wnl  . Asthma   . Fibroid   . Gestational diabetes   . Hypertension   . Morbid obesity ([place])   . Sleep apnea   . Urinary tract infection     Patient Active Problem List   Diagnosis Date Noted  . Fibroid uterus 09/17/2015  . Menorrhagia 08/27/2015  . Diabetes mellitus, type II ([place]) 09/13/2012  . BMI 60.0-69.9, adult ([place]) 09/13/2012  . Asthma 09/13/2012  . History of high blood pressure 09/13/2012  . OSA (obstructive sleep apnea) 12/10/2010    Past Surgical History:  Procedure Laterality Date  . CESAREAN SECTION    . DILATION AND CURETTAGE OF UTERUS    . TUBAL LIGATION       OB History    Gravida  4   Para  2   Term  1   Preterm  1   AB  2   Living        SAB  2   TAB  0   Ectopic  0   Multiple  1   Live Births               Home Medications    Prior to Admission medications   Medication Sig Start Date End Date Taking? Authorizing Provider  albuterol (PROVENTIL HFA;VENTOLIN HFA) 108 (90 BASE) MCG/ACT inhaler Inhale 2-3 puffs into the lungs every 6 (six) hours as needed for wheezing. Asthma/SOB   Yes [provider]  ALPRAZolam (XANAX PO) Take 0.5 mg by mouth 2 (two) times daily.    [provider]  azithromycin (ZITHROMAX) 250 MG tablet Take 1 tablet (250 mg total) by  mouth daily. Take first 2 tablets together, then 1 every day until finished. 04/18/18   Fredia Sorrow, MD  clotrimazole (GYNE-LOTRIMIN) 1 % vaginal cream Place 1 Applicatorful vaginally at bedtime. 03/25/18   Law, Bea Graff, [name]  dicyclomine (BENTYL) 20 MG tablet Take 1 tablet (20 mg total) by mouth 2 (two) times daily. 01/05/17   Gareth Morgan, MD  doxycycline (VIBRAMYCIN) 100 MG capsule Take 1 capsule (100 mg total) by mouth 2 (two) times daily. 03/25/18   Law, Bea Graff, [name]  EPINEPHrine (EPIPEN 2-PAK) 0.3 mg/0.3 mL IJ SOAJ injection Inject 0.3 mLs (0.3 mg total) into the muscle once as needed (for severe allergic reaction). CAll 911 immediately if you have to use this medicine 05/20/14   Harvie Heck, [name]  Liraglutide -Weight Management (SAXENDA [place]) Inject into the skin.    [provider]  metFORMIN (GLUCOPHAGE) 500 MG tablet Take by mouth 2 (two) times daily with a meal.    [provider]  metroNIDAZOLE (FLAGYL) 500 MG tablet Take 1 tablet (500 mg total) by mouth 2 (two) times daily with a meal. DO NOT  CONSUME ALCOHOL WHILE TAKING THIS MEDICATION. 02/02/17   Ocie Cornfield T, [name]  ondansetron (ZOFRAN ODT) 4 MG disintegrating tablet Take 1 tablet (4 mg total) by mouth every 8 (eight) hours as needed for nausea or vomiting. 01/05/17   Gareth Morgan, MD  OVER THE COUNTER MEDICATION Place 2 drops into both eyes daily. Reported on 08/27/2015    [provider]  pantoprazole (PROTONIX) 20 MG tablet Take 1 tablet (20 mg total) by mouth 2 (two) times daily. 05/20/16 06/05/16  Gareth Morgan, MD  predniSONE (DELTASONE) 10 MG tablet Take 4 tablets (40 mg total) by mouth daily. 04/18/18   Fredia Sorrow, MD  saxagliptin HCl (ONGLYZA) 2.5 MG TABS tablet Take 2.5 mg by mouth daily.    [provider]  traMADol (ULTRAM) 50 MG tablet Take 1 tablet (50 mg total) by mouth every 6 (six) hours as needed. 02/02/17   Doristine Devoid, [name]    Family  History Family History  Problem Relation Age of Onset  . Diabetes Father   . Allergies Mother   . Asthma Mother   . Hypertension Mother   . Diabetes Mother   . Diabetes Maternal Grandmother   . Heart disease Maternal Grandmother   . Anesthesia problems Neg Hx   . Hypotension Neg Hx   . Malignant hyperthermia Neg Hx   . Pseudochol deficiency Neg Hx   . Hearing loss Neg Hx     Social History Social History   Tobacco Use  . Smoking status: Current Some Day Smoker  . Smokeless tobacco: Never Used  Substance Use Topics  . Alcohol use: No    Comment: occasional  . Drug use: No     Allergies   Other and Penicillins   Review of Systems Review of Systems  Constitutional: Negative for fever.  HENT: Positive for congestion, sinus pressure and sinus pain.   Eyes: Negative for redness.  Respiratory: Positive for cough and shortness of breath.   Cardiovascular: Negative for chest pain.  Gastrointestinal: Negative for abdominal pain.  Genitourinary: Negative for dysuria.  Musculoskeletal: Negative for myalgias.  Skin: Negative for rash.  Neurological: Negative for headaches.  Hematological: Does not bruise/bleed easily.  Psychiatric/Behavioral: Negative for confusion.     Physical Exam Updated Vital Signs BP (!) 160/96 (BP Location: Left Arm)   Pulse 80   Temp 98.8 F (37.1 C) (Oral)   Resp 20   Ht 1.689 m (5' 6.5")   Wt (!) 158.8 kg   LMP 03/23/2018   SpO2 98%   BMI 55.66 kg/m   Physical Exam  Constitutional: She is oriented to person, place, and time. She appears well-developed and well-nourished. No distress.  HENT:  Head: Normocephalic and atraumatic.  Mouth/Throat: Oropharynx is clear and moist.  Eyes: Pupils are equal, round, and reactive to light. Conjunctivae and EOM are normal.  Neck: Neck supple.  Cardiovascular: Normal rate, regular rhythm and normal heart sounds.  Pulmonary/Chest: Effort normal and breath sounds normal. No respiratory distress.  She has no wheezes.  Abdominal: Soft. Bowel sounds are normal. There is no tenderness.  Neurological: She is alert and oriented to person, place, and time. No cranial nerve deficit or sensory deficit. She exhibits normal muscle tone. Coordination normal.  Skin: Skin is warm. No rash noted.  Nursing note and vitals reviewed.    ED Treatments / Results  Labs (all labs ordered are listed, but only abnormal results are displayed) Labs Reviewed - No data to display  EKG None  Radiology No results found.  Procedures Procedures (including critical care time)  Medications Ordered in ED Medications  predniSONE (DELTASONE) tablet 60 mg (60 mg Oral Given 04/18/18 1259)     Initial Impression / Assessment and Plan / ED Course  I have reviewed the triage vital signs and the nursing notes.  Pertinent labs & imaging results that were available during my care of the patient were reviewed by me and considered in my medical decision making (see chart for details).     Patient nontoxic no acute distress.  No wheezing here.  Patient symptoms consistent with an upper respiratory bronchitis.  Patient also with maxillary sinus pressure discomfort.  Patient states is been helped in the past with the antibiotic Zithromax.  Although we know antibiotics not necessarily needed for acute sinusitis.  We will go ahead and prescribe that.  Could help her bronchitis some to.  She also be started on prednisone.  Patient has primary care doctor to follow-up with.  Final Clinical Impressions(s) / ED Diagnoses   Final diagnoses:  Acute maxillary sinusitis, recurrence not specified  Bronchitis    ED Discharge Orders         Ordered    predniSONE (DELTASONE) 10 MG tablet  Daily     04/18/18 1308    azithromycin (ZITHROMAX) 250 MG tablet  Daily     04/18/18 1308           Fredia Sorrow, MD 04/18/18 1314

## 2018-04-18 NOTE — Discharge Instructions (Signed)
Make an appointment to follow-up with your regular doctor.  Take the antibiotic azithromycin as directed.  Take the prednisone as directed.  Keep close watch on your blood sugars while on the prednisone.  Return for any new or worse symptoms.

## 2018-05-07 ENCOUNTER — Encounter (HOSPITAL_BASED_OUTPATIENT_CLINIC_OR_DEPARTMENT_OTHER): Payer: Self-pay | Admitting: Emergency Medicine

## 2018-05-07 ENCOUNTER — Emergency Department (HOSPITAL_BASED_OUTPATIENT_CLINIC_OR_DEPARTMENT_OTHER)
Admission: EM | Admit: 2018-05-07 | Discharge: 2018-05-07 | Disposition: A | Discharge status: Home / Self Care | Payer: Medicaid Other | Attending: Emergency Medicine | Admitting: Emergency Medicine

## 2018-05-07 ENCOUNTER — Other Ambulatory Visit: Payer: Self-pay

## 2018-05-07 DIAGNOSIS — F17210 Nicotine dependence, cigarettes, uncomplicated: Secondary | ICD-10-CM | POA: Diagnosis not present

## 2018-05-07 DIAGNOSIS — L02214 Cutaneous abscess of groin: Secondary | ICD-10-CM | POA: Insufficient documentation

## 2018-05-07 DIAGNOSIS — Z7984 Long term (current) use of oral hypoglycemic drugs: Secondary | ICD-10-CM | POA: Diagnosis not present

## 2018-05-07 DIAGNOSIS — L0291 Cutaneous abscess, unspecified: Secondary | ICD-10-CM

## 2018-05-07 DIAGNOSIS — Z79899 Other long term (current) drug therapy: Secondary | ICD-10-CM | POA: Insufficient documentation

## 2018-05-07 DIAGNOSIS — J45909 Unspecified asthma, uncomplicated: Secondary | ICD-10-CM | POA: Insufficient documentation

## 2018-05-07 DIAGNOSIS — I10 Essential (primary) hypertension: Secondary | ICD-10-CM | POA: Insufficient documentation

## 2018-05-07 DIAGNOSIS — E119 Type 2 diabetes mellitus without complications: Secondary | ICD-10-CM | POA: Insufficient documentation

## 2018-05-07 MED ORDER — LIDOCAINE HCL (PF) 1 % IJ SOLN
10.0000 mL | Freq: Once | INTRAMUSCULAR | Status: AC
  Administered 2018-05-07: 10 mL via INTRADERMAL
  Filled 2018-05-07: qty 10

## 2018-05-07 MED ORDER — CEPHALEXIN 500 MG PO CAPS: 500.0000 mg | ORAL_CAPSULE | Freq: Four times a day (QID) | ORAL | 0 refills | Status: AC

## 2018-05-07 MED ORDER — ACETAMINOPHEN 500 MG PO TABS
1000.0000 mg | ORAL_TABLET | Freq: Once | ORAL | Status: AC
  Administered 2018-05-07: 1000 mg via ORAL
  Filled 2018-05-07: qty 2

## 2018-05-07 MED ORDER — HYDROCODONE-ACETAMINOPHEN 5-325 MG PO TABS: 1.0000 | ORAL_TABLET | Freq: Four times a day (QID) | ORAL | 0 refills | Status: DC | PRN

## 2018-05-07 MED FILL — HYDROCODON-APAP 5-325: 5-325 | 2 days supply | Qty: 8 | Fill #0

## 2018-05-07 MED FILL — CEPHALEXIN 500 MG CAPSULE: 500 | 7 days supply | Qty: 28 | Fill #0

## 2018-05-07 NOTE — Discharge Instructions (Signed)
You can take Tylenol or Ibuprofen as directed for pain. You can alternate Tylenol and Ibuprofen every 4 hours. If you take Tylenol at 1pm, then you can take Ibuprofen at 5pm. Then you can take Tylenol again at 9pm.   You can take pain medication for severe breakthrough pain.  Return to the emergency department 2 days for wound recheck and to get the packing removed.  Return sooner for any fevers, worsening pain to the area, spreading of the redness of the leg, vomiting or any other worsening or concerning symptoms.

## 2018-05-07 NOTE — ED Triage Notes (Signed)
Pt presents with vaginal abscess for a week. Pt states she has history of them but this one is worse and interfering with walking. Pt thinks it is draining a little

## 2018-05-07 NOTE — ED Provider Notes (Signed)
[place] [facility] EMERGENCY DEPARTMENT Provider Note   CSN: [id number] Arrival date & time: 05/07/18  1048     History   Chief Complaint Chief Complaint  Patient presents with  . Abscess    HPI [name] is a 37 y.o. female past medical history of asthma, fibroid, gestational diabetes, hypertension who presents for an area of pain, redness, swelling noted to the inguinal fold has been ongoing for the last week.  She states she has had similar symptoms previously.  She states that over last week, it is gotten more painful and bigger.  She states that particularly when she walks, the area rubs against causing a lot of pain.  She states that she has not noticed any drainage from the area.  She reports that she has had to have them drained in the emergency department before and her last time was about 4 months ago.  She reports she has not had any fevers.  The history is provided by the patient.    Past Medical History:  Diagnosis Date  . Abnormal Pap smear    f/u wnl  . Asthma   . Fibroid   . Gestational diabetes   . Hypertension   . Morbid obesity ([place])   . Sleep apnea   . Urinary tract infection     Patient Active Problem List   Diagnosis Date Noted  . Fibroid uterus 09/17/2015  . Menorrhagia 08/27/2015  . Diabetes mellitus, type II ([place]) 09/13/2012  . BMI 60.0-69.9, adult ([place]) 09/13/2012  . Asthma 09/13/2012  . History of high blood pressure 09/13/2012  . OSA (obstructive sleep apnea) 12/10/2010    Past Surgical History:  Procedure Laterality Date  . CESAREAN SECTION    . DILATION AND CURETTAGE OF UTERUS    . TUBAL LIGATION       OB History    Gravida  4   Para  2   Term  1   Preterm  1   AB  2   Living        SAB  2   TAB  0   Ectopic  0   Multiple  1   Live Births               Home Medications    Prior to Admission medications   Medication Sig Start Date End Date Taking? Authorizing Provider  albuterol (PROVENTIL  HFA;VENTOLIN HFA) 108 (90 BASE) MCG/ACT inhaler Inhale 2-3 puffs into the lungs every 6 (six) hours as needed for wheezing. Asthma/SOB    [provider]  ALPRAZolam (XANAX PO) Take 0.5 mg by mouth 2 (two) times daily.    [provider]  azithromycin (ZITHROMAX) 250 MG tablet Take 1 tablet (250 mg total) by mouth daily. Take first 2 tablets together, then 1 every day until finished. 04/18/18   Fredia Sorrow, MD  cephALEXin (KEFLEX) 500 MG capsule Take 1 capsule (500 mg total) by mouth 4 (four) times daily for 7 days. 05/07/18 05/14/18  Volanda Napoleon, [name]  clotrimazole (GYNE-LOTRIMIN) 1 % vaginal cream Place 1 Applicatorful vaginally at bedtime. 03/25/18   Law, Bea Graff, [name]  dicyclomine (BENTYL) 20 MG tablet Take 1 tablet (20 mg total) by mouth 2 (two) times daily. 01/05/17   Gareth Morgan, MD  doxycycline (VIBRAMYCIN) 100 MG capsule Take 1 capsule (100 mg total) by mouth 2 (two) times daily. 03/25/18   Law, Bea Graff, [name]  EPINEPHrine (EPIPEN 2-PAK) 0.3 mg/0.3 mL IJ SOAJ injection  Inject 0.3 mLs (0.3 mg total) into the muscle once as needed (for severe allergic reaction). CAll 911 immediately if you have to use this medicine 05/20/14   Harvie Heck, [name]  HYDROcodone-acetaminophen (NORCO/VICODIN) 5-325 MG tablet Take 1-2 tablets by mouth every 6 (six) hours as needed. 05/07/18   Volanda Napoleon, [name]  Liraglutide -Weight Management (SAXENDA [place]) Inject into the skin.    [provider]  metFORMIN (GLUCOPHAGE) 500 MG tablet Take by mouth 2 (two) times daily with a meal.    [provider]  metroNIDAZOLE (FLAGYL) 500 MG tablet Take 1 tablet (500 mg total) by mouth 2 (two) times daily with a meal. DO NOT CONSUME ALCOHOL WHILE TAKING THIS MEDICATION. 02/02/17   Ocie Cornfield T, [name]  ondansetron (ZOFRAN ODT) 4 MG disintegrating tablet Take 1 tablet (4 mg total) by mouth every 8 (eight) hours as needed for nausea or vomiting. 01/05/17   Gareth Morgan, MD    OVER THE COUNTER MEDICATION Place 2 drops into both eyes daily. Reported on 08/27/2015    [provider]  pantoprazole (PROTONIX) 20 MG tablet Take 1 tablet (20 mg total) by mouth 2 (two) times daily. 05/20/16 06/05/16  Gareth Morgan, MD  predniSONE (DELTASONE) 10 MG tablet Take 4 tablets (40 mg total) by mouth daily. 04/18/18   Fredia Sorrow, MD  saxagliptin HCl (ONGLYZA) 2.5 MG TABS tablet Take 2.5 mg by mouth daily.    [provider]  traMADol (ULTRAM) 50 MG tablet Take 1 tablet (50 mg total) by mouth every 6 (six) hours as needed. 02/02/17   Doristine Devoid, [name]    Family History Family History  Problem Relation Age of Onset  . Diabetes Father   . Allergies Mother   . Asthma Mother   . Hypertension Mother   . Diabetes Mother   . Diabetes Maternal Grandmother   . Heart disease Maternal Grandmother   . Anesthesia problems Neg Hx   . Hypotension Neg Hx   . Malignant hyperthermia Neg Hx   . Pseudochol deficiency Neg Hx   . Hearing loss Neg Hx     Social History Social History   Tobacco Use  . Smoking status: Current Some Day Smoker  . Smokeless tobacco: Never Used  Substance Use Topics  . Alcohol use: No    Comment: occasional  . Drug use: No     Allergies   Other and Penicillins   Review of Systems Review of Systems  Constitutional: Negative for fever.  Skin: Positive for color change and wound.  All other systems reviewed and are negative.    Physical Exam Updated Vital Signs BP 134/84 (BP Location: Right Arm)   Pulse 96   Temp 99.1 F (37.3 C) (Oral)   Resp 20   Ht 5\' 6"  (1.676 m)   Wt (!) 158.8 kg   LMP 04/28/2018   SpO2 99%   BMI 56.49 kg/m   Physical Exam  Constitutional: She appears well-developed and well-nourished.  HENT:  Head: Normocephalic and atraumatic.  Eyes: Conjunctivae and EOM are normal. Right eye exhibits no discharge. Left eye exhibits no discharge. No scleral icterus.  Pulmonary/Chest: Effort normal.   Genitourinary:     Neurological: She is alert.  Skin: Skin is warm and dry.  Psychiatric: She has a normal mood and affect. Her speech is normal and behavior is normal.  Nursing note and vitals reviewed.    ED Treatments / Results  Labs (all labs ordered are listed, but only  abnormal results are displayed) Labs Reviewed - No data to display  EKG None  Radiology No results found.  Procedures .Marland KitchenIncision and Drainage Date/Time: 05/07/2018 1:25 PM Performed by: Volanda Napoleon, [name] Authorized by: Volanda Napoleon, [name]   Consent:    Consent obtained:  Verbal   Consent given by:  Patient   Risks discussed:  Bleeding, incomplete drainage, pain and infection Location:    Type:  Abscess   Size:  4   Location:  Anogenital   Anogenital location: right labial fold. Pre-procedure details:    Skin preparation:  Betadine Anesthesia (see MAR for exact dosages):    Anesthesia method:  Local infiltration   Local anesthetic:  Lidocaine 1% w/o epi Procedure type:    Complexity:  Simple Procedure details:    Incision types:  Stab incision   Scalpel blade:  11   Wound management:  Irrigated with saline and probed and deloculated   Drainage:  Purulent   Drainage amount:  Moderate   Wound treatment:  Drain placed   Packing materials:  1/4 in iodoform gauze Post-procedure details:    Patient tolerance of procedure:  Tolerated well, no immediate complications   (including critical care time)  Medications Ordered in ED Medications  lidocaine (PF) (XYLOCAINE) 1 % injection 10 mL (10 mLs Intradermal Given 05/07/18 1222)  acetaminophen (TYLENOL) tablet 1,000 mg (1,000 mg Oral Given 05/07/18 1324)     Initial Impression / Assessment and Plan / ED Course  I have reviewed the triage vital signs and the nursing notes.  Pertinent labs & imaging results that were available during my care of the patient were reviewed by me and considered in my medical decision making (see chart for  details).      37 y.o. female who presents for evaluation of pain, redness, swelling noted to the lateral aspect of her right labia/inguinal fold.  Reports that the area has become more painful in the last week.  No fevers.  She reports she has a history of abscesses to the area and states that this feels similar. Patient is afebrile, non-toxic appearing, sitting comfortably on examination table. Vital signs reviewed and stable. On exam, patient is slightly tachycardic and hypertensive.  This is likely secondary to pain.  Will reassess.  On exam, is a diffuse area of about 4 cm of erythema, warmth with surrounding induration and central fluctuance.  Concerning for an abscess.  We will plan for I&D here in the department.  At this time, do not suspect systemic as patient is no febrile and it does not extend into the perineal area.  I&D as documented above.  Patient tolerated procedure well.  Drain placed for further treatment given extent of abscess.  Patient instructed on wound care precautions.  Given surrounding concerning signs for cellulitis, will plan to start patient on antibiotic.  Patient instructed on supportive at home wound cares.  Patient reports she has a history of allergies to penicillin.  I reviewed her records.  She has had Keflex without any issues before.  We will plan to send her home with Keflex. Patient had ample opportunity for questions and discussion. All patient's questions were answered with full understanding. Strict return precautions discussed. Patient expresses understanding and agreement to plan.   Final Clinical Impressions(s) / ED Diagnoses   Final diagnoses:  Abscess    ED Discharge Orders         Ordered    cephALEXin (KEFLEX) 500 MG capsule  4 times daily  05/07/18 1319    HYDROcodone-acetaminophen (NORCO/VICODIN) 5-325 MG tablet  Every 6 hours PRN     05/07/18 1319           Volanda Napoleon, [name] 05/07/18 1538    Gareth Morgan, MD 05/07/18  2218

## 2018-07-19 ENCOUNTER — Encounter: Payer: Self-pay | Admitting: Nurse Practitioner

## 2018-07-19 ENCOUNTER — Other Ambulatory Visit: Payer: Self-pay

## 2018-07-19 ENCOUNTER — Ambulatory Visit: Payer: Medicaid Other | Admitting: Nurse Practitioner

## 2018-07-19 VITALS — BP 140/82 | HR 104 | Temp 98.8°F | Ht 65.4 in | Wt 342.4 lb

## 2018-07-19 DIAGNOSIS — Z794 Long term (current) use of insulin: Secondary | ICD-10-CM

## 2018-07-19 DIAGNOSIS — I10 Essential (primary) hypertension: Secondary | ICD-10-CM | POA: Insufficient documentation

## 2018-07-19 DIAGNOSIS — F329 Major depressive disorder, single episode, unspecified: Secondary | ICD-10-CM | POA: Diagnosis not present

## 2018-07-19 DIAGNOSIS — F32A Depression, unspecified: Secondary | ICD-10-CM

## 2018-07-19 DIAGNOSIS — F419 Anxiety disorder, unspecified: Secondary | ICD-10-CM | POA: Diagnosis not present

## 2018-07-19 DIAGNOSIS — E119 Type 2 diabetes mellitus without complications: Secondary | ICD-10-CM

## 2018-07-19 DIAGNOSIS — F418 Other specified anxiety disorders: Secondary | ICD-10-CM | POA: Insufficient documentation

## 2018-07-19 MED ORDER — LISINOPRIL-HYDROCHLOROTHIAZIDE 10-12.5 MG PO TABS: 1.0000 | ORAL_TABLET | Freq: Every day | ORAL | 2 refills | Status: DC

## 2018-07-19 MED ORDER — ALPRAZOLAM 0.5 MG PO TABS: 0.5000 mg | ORAL_TABLET | Freq: Two times a day (BID) | ORAL | 0 refills | Status: DC | PRN

## 2018-07-19 NOTE — Patient Instructions (Signed)
Type 2 Diabetes Mellitus, Diagnosis, Adult Type 2 diabetes (type 2 diabetes mellitus) is a long-term (chronic) disease. It may be caused by one or both of these problems:  Your body does not make enough of a hormone called insulin.  Your body does not react in a normal way to insulin that it makes.  Insulin lets sugars (glucose) go into cells in the body. This gives you energy. If you have type 2 diabetes, sugars cannot get into cells. This causes high blood sugar (hyperglycemia). Your doctor will set treatment goals for you. Generally, you should have these blood sugar levels:  Before meals (preprandial): 80-130 mg/dL (4.4-7.2 mmol/L).  After meals (postprandial): below 180 mg/dL (10 mmol/L).  A1c (hemoglobin A1c) level: less than 7%.  Follow these instructions at home: Questions to Ask Your Doctor  You may want to ask these questions:  Do I need to meet with a diabetes educator?  Where can I find a support group for people with diabetes?  What equipment will I need to care for myself at home?  What diabetes medicines do I need? When should I take them?  How often do I need to check my blood sugar?  What number can I call if I have questions?  When is my next doctor's visit?  General instructions  Take over-the-counter and prescription medicines only as told by your doctor.  Keep all follow-up visits as told by your doctor. This is important. Contact a doctor if:  Your blood sugar is at or above 240 mg/dL (13.3 mmol/L) for 2 days in a row.  You have been sick or have had a fever for 2 days or more and you are not getting better.  You have any of these problems for more than 6 hours: ? You cannot eat or drink. ? You feel sick to your stomach (nauseous). ? You throw up (vomit). ? You have watery poop (diarrhea). Get help right away if:  Your blood sugar is lower than 54 mg/dL (3 mmol/L).  You get confused.  You have trouble: ? Thinking  clearly. ? Breathing.  You have moderate or large ketone levels in your pee (urine). This information is not intended to replace advice given to you by your health care provider. Make sure you discuss any questions you have with your health care provider. Document Released: 04/29/2008 Document Revised: 12/27/2015 Document Reviewed: 08/24/2015 Elsevier Interactive Patient Education  [employer].

## 2018-07-19 NOTE — Progress Notes (Addendum)
Subjective:     Patient ID: [name] , female    DOB: 1980/12/30 , 37 y.o.   MRN: [id number]   Chief Complaint  Patient presents with  . Diabetes  . 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    She is taking Tresiba 45 units daily, metformin 500 mg BID.    Diabetes  She presents for her follow-up diabetic visit. She has type 2 diabetes mellitus. Pertinent negatives for hypoglycemia include no confusion or dizziness. Pertinent negatives for diabetes include no blurred vision, no chest pain, no fatigue, no polydipsia, no polyphagia and no polyuria. Risk factors for coronary artery disease include diabetes mellitus, sedentary lifestyle and obesity. When asked about current treatments, none were reported. She is following a diabetic and generally unhealthy diet. When asked about meal planning, she reported none. She has not had a previous visit with a dietitian. She rarely participates in exercise. (Has not been checking her blood sugar she is unable to find her machine.)  Depression         Associated symptoms include no fatigue.    Past Medical History:  Diagnosis Date  . Abnormal Pap smear    f/u wnl  . Asthma   . Fibroid   . Gestational diabetes   . Hypertension   . Morbid obesity ([place])   . Sleep apnea   . Urinary tract infection      Family History  Problem Relation Age of Onset  . Diabetes Father   . Allergies Mother   . Asthma Mother   . Hypertension Mother   . Diabetes Mother   . Diabetes Maternal Grandmother   . Heart disease Maternal Grandmother   . Anesthesia problems Neg Hx   . Hypotension Neg Hx   . Malignant hyperthermia Neg Hx   . Pseudochol deficiency Neg Hx   . Hearing loss Neg Hx      Current Outpatient Medications:  .  albuterol (PROVENTIL HFA;VENTOLIN HFA) 108 (90 BASE) MCG/ACT inhaler, Inhale 2-3 puffs into the lungs every 6 (six) hours as needed for  wheezing. Asthma/SOB, Disp: , Rfl:  .  EPINEPHrine (EPIPEN 2-PAK) 0.3 mg/0.3 mL IJ SOAJ injection, Inject 0.3 mLs (0.3 mg total) into the muscle once as needed (for severe allergic reaction). CAll 911 immediately if you have to use this medicine, Disp: 2 Device, Rfl: 1 .  metFORMIN (GLUCOPHAGE) 500 MG tablet, Take by mouth 2 (two) times daily with a meal., Disp: , Rfl:  .  OVER THE COUNTER MEDICATION, Place 2 drops into both eyes daily. Reported on 08/27/2015, Disp: , Rfl:  .  Liraglutide -Weight Management (SAXENDA [place]), Inject into the skin., Disp: , Rfl:  .  pantoprazole (PROTONIX) 20 MG tablet, Take 1 tablet (20 mg total) by mouth 2 (two) times daily., Disp: 32 tablet, Rfl: 0 .  saxagliptin HCl (ONGLYZA) 2.5 MG TABS tablet, Take 2.5 mg by mouth daily., Disp: , Rfl:  .  traMADol (ULTRAM) 50 MG tablet, Take 1 tablet (50 mg total) by mouth every 6 (six) hours as needed. (Patient not taking: Reported on 07/19/2018), Disp: 8 tablet, Rfl: 0   Allergies  Allergen Reactions  . Other Anaphylaxis and Other (See Comments)    Pt has fish allergy Pt has allergy to butter cookies  . Penicillins Rash and Other (See Comments)    Told MD that PCN reaction is rash and that  she has taken amoxicillin before     Review of Systems  Constitutional: Negative for fatigue.  Eyes: Negative for blurred vision.  Respiratory: Negative.   Cardiovascular: Negative.  Negative for chest pain, palpitations and leg swelling.  Endocrine: Negative for polydipsia, polyphagia and polyuria.  Neurological: Negative for dizziness.  Psychiatric/Behavioral: Positive for depression. Negative for agitation and confusion.       Reports feeling down due to not currently working     Today's Vitals   07/19/18 1522 07/19/18 1547  BP: (!) 140/98 140/82  Pulse: (!) 104   Temp: 98.8 F (37.1 C)   TempSrc: Oral   SpO2: 98%   Weight: (!) 342 lb 6.4 oz (155.3 kg)   Height: 5' 5.4" (1.661 m)    Body mass index is 56.28 kg/m.    Objective:  Physical Exam Constitutional:      Appearance: Normal appearance. She is well-developed. She is obese.  Neck:     Musculoskeletal: Normal range of motion and neck supple.  Cardiovascular:     Rate and Rhythm: Normal rate and regular rhythm.     Pulses: Normal pulses.     Heart sounds: Normal heart sounds. No murmur.  Pulmonary:     Effort: Pulmonary effort is normal.     Breath sounds: Normal breath sounds.  Chest:     Chest wall: No tenderness.  Musculoskeletal: Normal range of motion.  Skin:    General: Skin is warm and dry.     Capillary Refill: Capillary refill takes less than 2 seconds.  Neurological:     Mental Status: She is alert and oriented to person, place, and time.  Psychiatric:        Mood and Affect: Mood normal.     Comments: Calm today yet at times tearful        Assessment And Plan:    1. Depression, unspecified depression type  Chronic, not well controlled  She has not been taking her antidepressant regulary  I will refer her to psychiatry for further evaluation particularly for counnseling  Will restart current medications - Ambulatory referral to Psychiatry  2. Anxiety  See above  Having more anxiety since being without a job - Ambulatory referral to Psychiatry - ALPRAZolam (XANAX) 0.5 MG tablet; Take 1 tablet (0.5 mg total) by mouth 2 (two) times daily as needed for anxiety or sleep.  Dispense: 30 tablet; Refill: 0  3. Type 2 diabetes mellitus without complication, with long-term current use of insulin (HCC)  Chronic, poorly controlled  We will see her HgbA1c and determine the direction for her medication she has a new insurance  Encouraged to limit intake of sugary foods and drinks  Encouraged to increase physical activity to 150 minutes per week - Hemoglobin A1c - CMP14 + Anion Gap - Lipid panel - CBC no Diff - lisinopril-hydrochlorothiazide (PRINZIDE,ZESTORETIC) 10-12.5 MG tablet; Take 1 tablet by mouth daily.   Dispense: 30 tablet; Refill: 2  4. Essential hypertension . B/P is poorly controlled.  . She has not been consistent with her blood pressure medications. [name] BMP ordered to check renal function.  . The importance of regular exercise and dietary modification was stressed to the patient.  . Stressed importance of losing ten percent of her body weight to help with B/P control.  . The weight loss would help with decreasing cardiac and cancer risk as well.  - lisinopril-hydrochlorothiazide (PRINZIDE,ZESTORETIC) 10-12.5 MG tablet; Take 1 tablet by mouth daily.  Dispense: 30 tablet; Refill: 2  Minette Brine, FNP

## 2018-07-20 LAB — CBC
HEMOGLOBIN: 11.5 g/dL (ref 11.1–15.9)
Hematocrit: 36.5 % (ref 34.0–46.6)
MCH: 23 pg — AB (ref 26.6–33.0)
MCHC: 31.5 g/dL (ref 31.5–35.7)
MCV: 73 fL — ABNORMAL LOW (ref 79–97)
Platelets: 406 10*3/uL (ref 150–450)
RBC: 5.01 x10E6/uL (ref 3.77–5.28)
RDW: 15.8 % — AB (ref 12.3–15.4)
WBC: 10.2 10*3/uL (ref 3.4–10.8)

## 2018-07-20 LAB — LIPID PANEL
CHOL/HDL RATIO: 4.4 ratio (ref 0.0–4.4)
Cholesterol, Total: 176 mg/dL (ref 100–199)
HDL: 40 mg/dL (ref >39)
LDL Calculated: 112 mg/dL — ABNORMAL HIGH (ref 0–99)
TRIGLYCERIDES: 120 mg/dL (ref 0–149)
VLDL Cholesterol Cal: 24 mg/dL (ref 5–40)

## 2018-07-20 LAB — CMP14 + ANION GAP
ALBUMIN: 4.1 g/dL (ref 3.5–5.5)
ALT: 10 IU/L (ref 0–32)
ANION GAP: 14 mmol/L (ref 10.0–18.0)
AST: 12 IU/L (ref 0–40)
Albumin/Globulin Ratio: 1.5 (ref 1.2–2.2)
Alkaline Phosphatase: 76 IU/L (ref 39–117)
BUN/Creatinine Ratio: 13 (ref 9–23)
BUN: 10 mg/dL (ref 6–20)
Bilirubin Total: <0.2 mg/dL (ref 0.0–1.2)
CALCIUM: 9.8 mg/dL (ref 8.7–10.2)
CO2: 23 mmol/L (ref 20–29)
CREATININE: 0.77 mg/dL (ref 0.57–1.00)
Chloride: 102 mmol/L (ref 96–106)
GFR calc Af Amer: 115 mL/min/{1.73_m2} (ref >59)
GFR, EST NON AFRICAN AMERICAN: 100 mL/min/{1.73_m2} (ref >59)
GLOBULIN, TOTAL: 2.8 g/dL (ref 1.5–4.5)
Glucose: 251 mg/dL — ABNORMAL HIGH (ref 65–99)
Potassium: 4.5 mmol/L (ref 3.5–5.2)
SODIUM: 139 mmol/L (ref 134–144)
Total Protein: 6.9 g/dL (ref 6.0–8.5)

## 2018-07-20 LAB — HEMOGLOBIN A1C
ESTIMATED AVERAGE GLUCOSE: 255 mg/dL
Hgb A1c MFr Bld: 10.5 % — ABNORMAL HIGH (ref 4.8–5.6)

## 2018-08-11 ENCOUNTER — Other Ambulatory Visit: Payer: Self-pay | Admitting: Nurse Practitioner

## 2018-08-11 DIAGNOSIS — Z794 Long term (current) use of insulin: Principal | ICD-10-CM

## 2018-08-11 DIAGNOSIS — E119 Type 2 diabetes mellitus without complications: Secondary | ICD-10-CM

## 2018-08-11 MED ORDER — INSULIN DEGLUDEC 100 UNIT/ML ~~LOC~~ SOPN: 10.0000 [IU] | PEN_INJECTOR | Freq: Every day | SUBCUTANEOUS | 3 refills | Status: DC

## 2018-08-11 MED ORDER — METFORMIN HCL 500 MG PO TABS: 1000.0000 mg | ORAL_TABLET | Freq: Two times a day (BID) | ORAL | 1 refills | Status: DC

## 2018-08-11 MED ORDER — EXENATIDE ER 2 MG ~~LOC~~ PEN: 2.0000 mg | PEN_INJECTOR | SUBCUTANEOUS | 3 refills | Status: DC

## 2018-08-17 ENCOUNTER — Ambulatory Visit: Payer: Medicaid Other | Admitting: Podiatry

## 2018-08-17 DIAGNOSIS — H10413 Chronic giant papillary conjunctivitis, bilateral: Secondary | ICD-10-CM | POA: Diagnosis not present

## 2018-08-17 DIAGNOSIS — E119 Type 2 diabetes mellitus without complications: Secondary | ICD-10-CM | POA: Diagnosis not present

## 2018-08-17 DIAGNOSIS — H5213 Myopia, bilateral: Secondary | ICD-10-CM | POA: Diagnosis not present

## 2018-08-17 LAB — HM DIABETES EYE EXAM

## 2018-08-17 NOTE — Progress Notes (Signed)
See if she can come pick up sample of Tresiba until PA done.

## 2018-08-19 ENCOUNTER — Other Ambulatory Visit: Payer: Self-pay | Admitting: Podiatry

## 2018-08-19 ENCOUNTER — Ambulatory Visit (INDEPENDENT_AMBULATORY_CARE_PROVIDER_SITE_OTHER): Payer: Medicaid Other

## 2018-08-19 ENCOUNTER — Ambulatory Visit: Payer: Medicaid Other | Admitting: Podiatry

## 2018-08-19 VITALS — BP 118/74 | HR 96

## 2018-08-19 DIAGNOSIS — G8929 Other chronic pain: Secondary | ICD-10-CM

## 2018-08-19 DIAGNOSIS — D492 Neoplasm of unspecified behavior of bone, soft tissue, and skin: Secondary | ICD-10-CM

## 2018-08-19 DIAGNOSIS — M722 Plantar fascial fibromatosis: Secondary | ICD-10-CM

## 2018-08-19 DIAGNOSIS — M79671 Pain in right foot: Secondary | ICD-10-CM

## 2018-08-19 MED ORDER — TRIAMCINOLONE ACETONIDE 10 MG/ML IJ SUSP: 10.0000 mg | Freq: Once | INTRAMUSCULAR | Status: DC

## 2018-08-19 MED ORDER — MELOXICAM 15 MG PO TABS: 15.0000 mg | ORAL_TABLET | Freq: Every day | ORAL | 0 refills | Status: DC

## 2018-08-19 NOTE — Patient Instructions (Signed)

## 2018-08-19 NOTE — Progress Notes (Signed)
DG  °

## 2018-08-22 NOTE — Progress Notes (Signed)
Subjective:   Patient ID: [name], female   DOB: 38 y.o.   MRN: [id number]   HPI 38 year old female presents the office today for concerns of right heel pain on the bottom of her heel.  She states that this has been ongoing for last 1 year she states that it hurts at times to bear weight.  She gets pain in the morning when she first gets up and gets better with activity but she gets pain after is been on her feet all day.  Pain is intermittent.  She also gets calluses to both of her big toes on medial aspect the left side worse than the right.  Areas are painful with pressure in shoes.  In general she has no numbness or tingling to her feet.  No recent treatment.  She has no other concerns.   Review of Systems  All other systems reviewed and are negative.  Past Medical History:  Diagnosis Date  . Abnormal Pap smear    f/u wnl  . Asthma   . Fibroid   . Gestational diabetes   . Hypertension   . Morbid obesity ([place])   . Sleep apnea   . Urinary tract infection     Past Surgical History:  Procedure Laterality Date  . CESAREAN SECTION    . DILATION AND CURETTAGE OF UTERUS    . TUBAL LIGATION       Current Outpatient Medications:  .  albuterol (PROVENTIL HFA;VENTOLIN HFA) 108 (90 BASE) MCG/ACT inhaler, Inhale 2-3 puffs into the lungs every 6 (six) hours as needed for wheezing. Asthma/SOB, Disp: , Rfl:  .  ALPRAZolam (XANAX) 0.5 MG tablet, Take 1 tablet (0.5 mg total) by mouth 2 (two) times daily as needed for anxiety or sleep., Disp: 30 tablet, Rfl: 0 .  EPINEPHrine (EPIPEN 2-PAK) 0.3 mg/0.3 mL IJ SOAJ injection, Inject 0.3 mLs (0.3 mg total) into the muscle once as needed (for severe allergic reaction). CAll 911 immediately if you have to use this medicine, Disp: 2 Device, Rfl: 1 .  Exenatide ER (BYDUREON) 2 MG PEN, Inject 2 mg into the skin once a week., Disp: 4 each, Rfl: 3 .  insulin degludec (TRESIBA FLEXTOUCH) 100 UNIT/ML SOPN FlexTouch Pen, Inject 0.1 mLs (10 Units total)  into the skin daily., Disp: 5 pen, Rfl: 3 .  lisinopril-hydrochlorothiazide (PRINZIDE,ZESTORETIC) 10-12.5 MG tablet, Take 1 tablet by mouth daily., Disp: 30 tablet, Rfl: 2 .  metFORMIN (GLUCOPHAGE) 500 MG tablet, Take 2 tablets (1,000 mg total) by mouth 2 (two) times daily with a meal., Disp: 180 tablet, Rfl: 1 .  OVER THE COUNTER MEDICATION, Place 2 drops into both eyes daily. Reported on 08/27/2015, Disp: , Rfl:  .  meloxicam (MOBIC) 15 MG tablet, Take 1 tablet (15 mg total) by mouth daily., Disp: 14 tablet, Rfl: 0 .  pantoprazole (PROTONIX) 20 MG tablet, Take 1 tablet (20 mg total) by mouth 2 (two) times daily., Disp: 32 tablet, Rfl: 0 .  traMADol (ULTRAM) 50 MG tablet, Take 1 tablet (50 mg total) by mouth every 6 (six) hours as needed. (Patient not taking: Reported on 08/19/2018), Disp: 8 tablet, Rfl: 0  Current Facility-Administered Medications:  .  triamcinolone acetonide (KENALOG) 10 MG/ML injection 10 mg, 10 mg, Other, Once, Trula Slade, DPM  Allergies  Allergen Reactions  . Other Anaphylaxis and Other (See Comments)    Pt has fish allergy Pt has allergy to butter cookies  . Penicillins Rash and Other (See Comments)  Told MD that PCN reaction is rash and that she has taken amoxicillin before          Objective:  Physical Exam  General: AAO x3, NAD  Dermatological: Hyperkeratotic lesions medial hallux IPJ left side worse than right.  Upon debridement there is no underlying ulceration, drainage or any signs of infection noted today.  There are no open sores, no preulcerative lesions, no rash or signs of infection present.  Vascular: Dorsalis Pedis artery and Posterior Tibial artery pedal pulses are 2/4 bilateral with immedate capillary fill time. Pedal hair growth present. No varicosities and no lower extremity edema present bilateral. There is no pain with calf compression, swelling, warmth, erythema.   Neruologic: Grossly intact via light touch bilateral. Vibratory  intact via tuning fork bilateral. Protective threshold with Semmes Wienstein monofilament intact to all pedal sites bilateral.  Negative Tinel sign.  Musculoskeletal: Tenderness to palpation along the plantar medial tubercle of the calcaneus at the insertion of plantar fascia on the right foot. There is no pain along the course of the plantar fascia within the arch of the foot. Plantar fascia appears to be intact. There is no pain with lateral compression of the calcaneus or pain with vibratory sensation. There is no pain along the course or insertion of the achilles tendon. No other areas of tenderness to bilateral lower extremities. Muscular strength 5/5 in all groups tested bilateral.  Gait: Unassisted, Nonantalgic.      Assessment:   38 year old female with right heel pain, plantar fasciitis with hyperkeratotic lesions bilateral hallux     Plan:  -Treatment options discussed including all alternatives, risks, and complications -Etiology of symptoms were discussed -X-rays were obtained and reviewed with the patient.  There is no evidence of acute fracture or stress fracture identified today.  1.  Right heel pain complaint of fasciitis -Steroid injection was performed. -Discussed stretching, icing exercises daily. -Discussed shoe modifications and orthotics -Plantar fascial brace dispensed -Night splint  2.  Calluses -As a courtesy I debrided the callus without any complications or bleeding.  Discussed moisturizer to the area daily.  Discussed shoe modifications and orthotics.  Procedure: Injection Tendon/Ligament Discussed alternatives, risks, complications and verbal consent was obtained.  Location: Right plantar fascia at the glabrous junction; medial approach. Skin Prep: Alcohol. Injectate: 0.5cc 0.5% marcaine plain, 0.5 cc 2% lidocaine plain and, 1 cc kenalog 10. Disposition: Patient tolerated procedure well. Injection site dressed with a band-aid.  Post-injection care was  discussed and return precautions discussed.   Return in about 4 weeks (around 09/16/2018).  Trula Slade DPM

## 2018-08-30 ENCOUNTER — Telehealth: Payer: Self-pay | Admitting: Nurse Practitioner

## 2018-08-30 NOTE — Telephone Encounter (Signed)
Called [employer] for PA for [place], [place] # [phone].  Interaction ID # [id number].  Pending upon review within 24 hours.

## 2018-09-03 DIAGNOSIS — F411 Generalized anxiety disorder: Secondary | ICD-10-CM | POA: Diagnosis not present

## 2018-09-03 DIAGNOSIS — F332 Major depressive disorder, recurrent severe without psychotic features: Secondary | ICD-10-CM | POA: Diagnosis not present

## 2018-09-16 ENCOUNTER — Ambulatory Visit: Payer: Medicaid Other | Admitting: Podiatry

## 2018-09-16 ENCOUNTER — Encounter: Payer: Self-pay | Admitting: Podiatry

## 2018-09-16 DIAGNOSIS — L84 Corns and callosities: Secondary | ICD-10-CM

## 2018-09-16 DIAGNOSIS — M722 Plantar fascial fibromatosis: Secondary | ICD-10-CM | POA: Diagnosis not present

## 2018-09-16 DIAGNOSIS — E1149 Type 2 diabetes mellitus with other diabetic neurological complication: Secondary | ICD-10-CM

## 2018-09-16 MED ORDER — MELOXICAM 15 MG PO TABS: 15.0000 mg | ORAL_TABLET | Freq: Every day | ORAL | 0 refills | Status: DC

## 2018-09-16 MED ORDER — TRIAMCINOLONE ACETONIDE 10 MG/ML IJ SUSP
10.0000 mg | Freq: Once | INTRAMUSCULAR | Status: AC
  Administered 2018-09-16: 10 mg

## 2018-09-16 MED ORDER — GABAPENTIN 100 MG PO CAPS: 100.0000 mg | ORAL_CAPSULE | Freq: Every day | ORAL | 0 refills | Status: DC

## 2018-09-16 NOTE — Patient Instructions (Signed)
Gabapentin capsules or tablets What is this medicine? GABAPENTIN (GA ba pen tin) is used to control seizures in certain types of epilepsy. It is also used to treat certain types of nerve pain. This medicine may be used for other purposes; ask your health care provider or pharmacist if you have questions. COMMON BRAND NAME(S): Active-PAC with Gabapentin, Gabarone, Neurontin What should I tell my health care provider before I take this medicine? They need to know if you have any of these conditions: -kidney disease -suicidal thoughts, plans, or attempt; a previous suicide attempt by you or a family member -an unusual or allergic reaction to gabapentin, other medicines, foods, dyes, or preservatives -pregnant or trying to get pregnant -breast-feeding How should I use this medicine? Take this medicine by mouth with a glass of water. Follow the directions on the prescription label. You can take it with or without food. If it upsets your stomach, take it with food. Take your medicine at regular intervals. Do not take it more often than directed. Do not stop taking except on your doctor's advice. If you are directed to break the 600 or 800 mg tablets in half as part of your dose, the extra half tablet should be used for the next dose. If you have not used the extra half tablet within 28 days, it should be thrown away. A special MedGuide will be given to you by the pharmacist with each prescription and refill. Be sure to read this information carefully each time. Talk to your pediatrician regarding the use of this medicine in children. While this drug may be prescribed for children as young as 3 years for selected conditions, precautions do apply. Overdosage: If you think you have taken too much of this medicine contact a poison control center or emergency room at once. NOTE: This medicine is only for you. Do not share this medicine with others. What if I miss a dose? If you miss a dose, take it as soon  as you can. If it is almost time for your next dose, take only that dose. Do not take double or extra doses. What may interact with this medicine? Do not take this medicine with any of the following medications: -other gabapentin products This medicine may also interact with the following medications: -alcohol -antacids -antihistamines for allergy, cough and cold -certain medicines for anxiety or sleep -certain medicines for depression or psychotic disturbances -homatropine; hydrocodone -naproxen -narcotic medicines (opiates) for pain -phenothiazines like chlorpromazine, mesoridazine, prochlorperazine, thioridazine This list may not describe all possible interactions. Give your health care provider a list of all the medicines, herbs, non-prescription drugs, or dietary supplements you use. Also tell them if you smoke, drink alcohol, or use illegal drugs. Some items may interact with your medicine. What should I watch for while using this medicine? Visit your doctor or health care professional for regular checks on your progress. You may want to keep a record at home of how you feel your condition is responding to treatment. You may want to share this information with your doctor or health care professional at each visit. You should contact your doctor or health care professional if your seizures get worse or if you have any new types of seizures. Do not stop taking this medicine or any of your seizure medicines unless instructed by your doctor or health care professional. Stopping your medicine suddenly can increase your seizures or their severity. Wear a medical identification bracelet or chain if you are taking this medicine for   seizures, and carry a card that lists all your medications. You may get drowsy, dizzy, or have blurred vision. Do not drive, use machinery, or do anything that needs mental alertness until you know how this medicine affects you. To reduce dizzy or fainting spells, do not  sit or stand up quickly, especially if you are an older patient. Alcohol can increase drowsiness and dizziness. Avoid alcoholic drinks. Your mouth may get dry. Chewing sugarless gum or sucking hard candy, and drinking plenty of water will help. The use of this medicine may increase the chance of suicidal thoughts or actions. Pay special attention to how you are responding while on this medicine. Any worsening of mood, or thoughts of suicide or dying should be reported to your health care professional right away. Women who become pregnant while using this medicine may enroll in the [place] American Antiepileptic Drug Pregnancy Registry by calling 1-888-233-2334. This registry collects information about the safety of antiepileptic drug use during pregnancy. What side effects may I notice from receiving this medicine? Side effects that you should report to your doctor or health care professional as soon as possible: -allergic reactions like skin rash, itching or hives, swelling of the face, lips, or tongue -worsening of mood, thoughts or actions of suicide or dying Side effects that usually do not require medical attention (report to your doctor or health care professional if they continue or are bothersome): -constipation -difficulty walking or controlling muscle movements -dizziness -nausea -slurred speech -tiredness -tremors -weight gain This list may not describe all possible side effects. Call your doctor for medical advice about side effects. You may report side effects to FDA at 1-800-FDA-1088. Where should I keep my medicine? Keep out of reach of children. This medicine may cause accidental overdose and death if it taken by other adults, children, or pets. Mix any unused medicine with a substance like cat litter or coffee grounds. Then throw the medicine away in a sealed container like a sealed bag or a coffee can with a lid. Do not use the medicine after the expiration date. Store at room  temperature between 15 and 30 degrees C (59 and 86 degrees F). NOTE: This sheet is a summary. It may not cover all possible information. If you have questions about this medicine, talk to your doctor, pharmacist, or health care provider.  2019 Elsevier/Gold Standard (2017-12-24 13:21:44)  

## 2018-09-17 DIAGNOSIS — F332 Major depressive disorder, recurrent severe without psychotic features: Secondary | ICD-10-CM | POA: Diagnosis not present

## 2018-09-17 DIAGNOSIS — F411 Generalized anxiety disorder: Secondary | ICD-10-CM | POA: Diagnosis not present

## 2018-09-20 NOTE — Progress Notes (Signed)
Subjective: 38 year old female presents the office today for follow-up evaluation of right heel pain, plantar fasciitis.  She states that overall she is doing better.  She states that she was compensating she has been putting more pressure on the left side she started to have discomfort in the left heel.  She denies any recent injury or trauma.  She does get some numbness and tingling to her feet and legs.  Pain does not wake her up at night.  She does ask for the calluses to return to both of her big toes.  She has no other concerns. Denies any systemic complaints such as fevers, chills, nausea, vomiting. No acute changes since last appointment, and no other complaints at this time.   Objective: AAO x3, NAD DP/PT pulses palpable bilaterally, CRT less than 3 seconds-denies claudication symptoms. Overall sensation appears to be intact with Thornell Mule monofilament but she does report nerve symptoms. Tenderness to palpation along the plantar medial tubercle of the calcaneus at the insertion of plantar fascia on the LEFT foot today and there is minimal discomfort on the right side.. There is no pain along the course of the plantar fascia within the arch of the foot. Plantar fascia appears to be intact. There is no pain with lateral compression of the calcaneus or pain with vibratory sensation. There is no pain along the course or insertion of the achilles tendon. No other areas of tenderness to bilateral lower extremities. Calluses bilateral medial hallux IPJ.  No underlying ulceration, drainage or any signs of infection. No open lesions or pre-ulcerative lesions.  No pain with calf compression, swelling, warmth, erythema  Assessment: Bilateral heel pain, plantar fasciitis; neuropathy; calluses  Plan: -All treatment options discussed with the patient including all alternatives, risks, complications.  -As a courtesy I debrided the calluses x2 without any complications or bleeding. -Regards to  neuropathy we discussed treatment options.  Start gabapentin 100 mg at nighttime and titrate up based on how she can tolerate it.  -Steroid injection for the left side today.  Dispensed plantar fascial brace.  Continue stretching, icing exercises daily. -Patient encouraged to call the office with any questions, concerns, change in symptoms.   Procedure: Injection Tendon/Ligament Discussed alternatives, risks, complications and verbal consent was obtained.  Location: Left plantar fascia at the glabrous junction; medial approach. Skin Prep: Alcohol. Injectate: 0.5cc 0.5% marcaine plain, 0.5 cc 2% lidocaine plain and, 1 cc kenalog 10. Disposition: Patient tolerated procedure well. Injection site dressed with a band-aid.  Post-injection care was discussed and return precautions discussed.   Trula Slade DPM

## 2018-09-21 ENCOUNTER — Encounter: Payer: Self-pay | Admitting: Nurse Practitioner

## 2018-10-14 ENCOUNTER — Ambulatory Visit: Payer: Medicaid Other | Admitting: Podiatry

## 2018-10-18 ENCOUNTER — Ambulatory Visit: Payer: Medicaid Other | Admitting: Podiatry

## 2018-10-18 ENCOUNTER — Ambulatory Visit: Payer: Medicaid Other | Admitting: Nurse Practitioner

## 2018-10-19 ENCOUNTER — Ambulatory Visit: Payer: Medicaid Other | Admitting: Internal Medicine

## 2018-10-19 ENCOUNTER — Encounter: Payer: Self-pay | Admitting: Internal Medicine

## 2018-10-19 ENCOUNTER — Other Ambulatory Visit: Payer: Self-pay

## 2018-10-19 VITALS — BP 180/90 | HR 87 | Temp 98.7°F | Ht 62.3 in | Wt 359.8 lb

## 2018-10-19 DIAGNOSIS — E1165 Type 2 diabetes mellitus with hyperglycemia: Secondary | ICD-10-CM

## 2018-10-19 DIAGNOSIS — G4733 Obstructive sleep apnea (adult) (pediatric): Secondary | ICD-10-CM

## 2018-10-19 DIAGNOSIS — R062 Wheezing: Secondary | ICD-10-CM

## 2018-10-19 DIAGNOSIS — Z6841 Body Mass Index (BMI) 40.0 and over, adult: Secondary | ICD-10-CM

## 2018-10-19 DIAGNOSIS — F419 Anxiety disorder, unspecified: Secondary | ICD-10-CM | POA: Diagnosis not present

## 2018-10-19 DIAGNOSIS — R635 Abnormal weight gain: Secondary | ICD-10-CM | POA: Diagnosis not present

## 2018-10-19 DIAGNOSIS — J301 Allergic rhinitis due to pollen: Secondary | ICD-10-CM | POA: Diagnosis not present

## 2018-10-19 DIAGNOSIS — I10 Essential (primary) hypertension: Secondary | ICD-10-CM

## 2018-10-19 MED ORDER — ALPRAZOLAM 0.5 MG PO TABS: 0.5000 mg | ORAL_TABLET | Freq: Two times a day (BID) | ORAL | 0 refills | Status: DC | PRN

## 2018-10-19 MED ORDER — CETIRIZINE HCL 10 MG PO TABS: 10.0000 mg | ORAL_TABLET | Freq: Every day | ORAL | 0 refills | Status: DC

## 2018-10-19 MED ORDER — BLOOD GLUCOSE MONITOR KIT: PACK | 0 refills | Status: DC

## 2018-10-19 MED ORDER — GLUCOSE BLOOD VI STRP: ORAL_STRIP | 12 refills | Status: DC

## 2018-10-19 MED ORDER — EZ SMART BLOOD GLUCOSE LANCETS MISC: 11 refills | Status: DC

## 2018-10-19 MED ORDER — IPRATROPIUM-ALBUTEROL 0.5-2.5 (3) MG/3ML IN SOLN
3.0000 mL | Freq: Once | RESPIRATORY_TRACT | Status: AC
  Administered 2018-10-19: 3 mL via RESPIRATORY_TRACT

## 2018-10-19 NOTE — Patient Instructions (Addendum)
1- Do blood pressure diaries every day 2-3 hours after taking your medication which should be taken in the morning and bring the recordings. It is stays more than 140/90, please call your provider.  2- Check your blood sugars every day fasting and after eating and bring the recordings next time you come.  3- untreated sleep apnea causes elevation of your blood pressure and therefore enlargement of the heart if it remains untreated.  4- use your inhaler every 4-6 hours for 7 days, then as needed only.  5- Take the allergy medication every day  6- you have gained 17 lb since the last visit.   Carbohydrate Counting for Diabetes Mellitus, Adult  Carbohydrate counting is a method of keeping track of how many carbohydrates you eat. Eating carbohydrates naturally increases the amount of sugar (glucose) in the blood. Counting how many carbohydrates you eat helps keep your blood glucose within normal limits, which helps you manage your diabetes (diabetes mellitus). It is important to know how many carbohydrates you can safely have in each meal. This is different for every person. A diet and nutrition specialist (registered dietitian) can help you make a meal plan and calculate how many carbohydrates you should have at each meal and snack. Carbohydrates are found in the following foods:  Grains, such as breads and cereals.  Dried beans and soy products.  Starchy vegetables, such as potatoes, peas, and corn.  Fruit and fruit juices.  Milk and yogurt.  Sweets and snack foods, such as cake, cookies, candy, chips, and soft drinks. How do I count carbohydrates? There are two ways to count carbohydrates in food. You can use either of the methods or a combination of both. Reading "Nutrition Facts" on packaged food The "Nutrition Facts" list is included on the labels of almost all packaged foods and beverages in the [place] It includes:  The serving size.  Information about nutrients in each serving,  including the grams (g) of carbohydrate per serving. To use the "Nutrition Facts":  Decide how many servings you will have.  Multiply the number of servings by the number of carbohydrates per serving.  The resulting number is the total amount of carbohydrates that you will be having. Learning standard serving sizes of other foods When you eat carbohydrate foods that are not packaged or do not include "Nutrition Facts" on the label, you need to measure the servings in order to count the amount of carbohydrates:  Measure the foods that you will eat with a food scale or measuring cup, if needed.  Decide how many standard-size servings you will eat.  Multiply the number of servings by 15. Most carbohydrate-rich foods have about 15 g of carbohydrates per serving. ? For example, if you eat 8 oz (170 g) of strawberries, you will have eaten 2 servings and 30 g of carbohydrates (2 servings x 15 g = 30 g).  For foods that have more than one food mixed, such as soups and casseroles, you must count the carbohydrates in each food that is included. The following list contains standard serving sizes of common carbohydrate-rich foods. Each of these servings has about 15 g of carbohydrates:   hamburger bun or  English muffin.   oz (15 mL) syrup.   oz (14 g) jelly.  1 slice of bread.  1 six-inch tortilla.  3 oz (85 g) cooked rice or pasta.  4 oz (113 g) cooked dried beans.  4 oz (113 g) starchy vegetable, such as peas, corn, or  potatoes.  4 oz (113 g) hot cereal.  4 oz (113 g) mashed potatoes or  of a large baked potato.  4 oz (113 g) canned or frozen fruit.  4 oz (120 mL) fruit juice.  4-6 crackers.  6 chicken nuggets.  6 oz (170 g) unsweetened dry cereal.  6 oz (170 g) plain fat-free yogurt or yogurt sweetened with artificial sweeteners.  8 oz (240 mL) milk.  8 oz (170 g) fresh fruit or one small piece of fruit.  24 oz (680 g) popped popcorn. Example of carbohydrate  counting Sample meal  3 oz (85 g) chicken breast.  6 oz (170 g) brown rice.  4 oz (113 g) corn.  8 oz (240 mL) milk.  8 oz (170 g) strawberries with sugar-free whipped topping. Carbohydrate calculation 1. Identify the foods that contain carbohydrates: ? Rice. ? Corn. ? Milk. ? Strawberries. 2. Calculate how many servings you have of each food: ? 2 servings rice. ? 1 serving corn. ? 1 serving milk. ? 1 serving strawberries. 3. Multiply each number of servings by 15 g: ? 2 servings rice x 15 g = 30 g. ? 1 serving corn x 15 g = 15 g. ? 1 serving milk x 15 g = 15 g. ? 1 serving strawberries x 15 g = 15 g. 4. Add together all of the amounts to find the total grams of carbohydrates eaten: ? 30 g + 15 g + 15 g + 15 g = 75 g of carbohydrates total. Summary  Carbohydrate counting is a method of keeping track of how many carbohydrates you eat.  Eating carbohydrates naturally increases the amount of sugar (glucose) in the blood.  Counting how many carbohydrates you eat helps keep your blood glucose within normal limits, which helps you manage your diabetes.  A diet and nutrition specialist (registered dietitian) can help you make a meal plan and calculate how many carbohydrates you should have at each meal and snack. This information is not intended to replace advice given to you by your health care provider. Make sure you discuss any questions you have with your health care provider. Document Released: 07/21/2005 Document Revised: 01/28/2017 Document Reviewed: 01/02/2016 Elsevier Interactive Patient Education  2019 [employer].

## 2018-10-19 NOTE — Progress Notes (Signed)
Subjective:     Patient ID: [name] , female    DOB: 03-May-1981 , 38 y.o.   MRN: [id number]   Chief Complaint  Patient presents with  . Diabetes    HPI 1-Pt here for DM, she does not check her glucose because she does not have a glucometer.  She started Herbal life 2 days ago. She never heard from the Noutrionist referral from here.  She has seen someone at a weight loss center and drinking shakes and following her instructions.  2- Her allergies has been acting up and been wheezing this week. Only used her inhaler every 3 days. 3- HTN FU- states her BP was slightly less than today, but still high last week. She has been taking her BP med qd. She has hx of OSA diagnosed 7 years ago, but never prescribed a machine.   Past Medical History:  Diagnosis Date  . Abnormal Pap smear    f/u wnl  . Asthma   . Fibroid   . Gestational diabetes   . Hypertension   . Morbid obesity ([place])   . Sleep apnea   . Urinary tract infection      Family History  Problem Relation Age of Onset  . Diabetes Father   . Allergies Mother   . Asthma Mother   . Hypertension Mother   . Diabetes Mother   . Diabetes Maternal Grandmother   . Heart disease Maternal Grandmother   . Anesthesia problems Neg Hx   . Hypotension Neg Hx   . Malignant hyperthermia Neg Hx   . Pseudochol deficiency Neg Hx   . Hearing loss Neg Hx      Current Outpatient Medications:  .  albuterol (PROVENTIL HFA;VENTOLIN HFA) 108 (90 BASE) MCG/ACT inhaler, Inhale 2-3 puffs into the lungs every 6 (six) hours as needed for wheezing. Asthma/SOB, Disp: , Rfl:  .  ALPRAZolam (XANAX) 0.5 MG tablet, Take 1 tablet (0.5 mg total) by mouth 2 (two) times daily as needed for anxiety or sleep., Disp: 30 tablet, Rfl: 0 .  blood glucose meter kit and supplies KIT, Dispense based on patient and insurance preference. Use up to four times daily as directed. (FOR ICD-9 250.00, 250.01). Machine was given here at the office., Disp: 1 each, Rfl:  0 .  cetirizine (ZYRTEC ALLERGY) 10 MG tablet, Take 1 tablet (10 mg total) by mouth daily., Disp: 90 tablet, Rfl: 0 .  EPINEPHrine (EPIPEN 2-PAK) 0.3 mg/0.3 mL IJ SOAJ injection, Inject 0.3 mLs (0.3 mg total) into the muscle once as needed (for severe allergic reaction). CAll 911 immediately if you have to use this medicine, Disp: 2 Device, Rfl: 1 .  Exenatide ER (BYDUREON) 2 MG PEN, Inject 2 mg into the skin once a week., Disp: 4 each, Rfl: 3 .  Ez Smart Blood Glucose Lancets MISC, Check glucose bid, Disp: 100 each, Rfl: 11 .  gabapentin (NEURONTIN) 100 MG capsule, Take 1 capsule (100 mg total) by mouth at bedtime., Disp: 90 capsule, Rfl: 0 .  glucose blood (CONTOUR TEST) test strip, To use check glucose bid, Disp: 100 each, Rfl: 12 .  insulin degludec (TRESIBA FLEXTOUCH) 100 UNIT/ML SOPN FlexTouch Pen, Inject 0.1 mLs (10 Units total) into the skin daily., Disp: 5 pen, Rfl: 3 .  meloxicam (MOBIC) 15 MG tablet, Take 1 tablet (15 mg total) by mouth daily., Disp: 30 tablet, Rfl: 0 .  metFORMIN (GLUCOPHAGE) 500 MG tablet, Take 2 tablets (1,000 mg total) by mouth 2 (two) times  daily with a meal., Disp: 180 tablet, Rfl: 1 .  OVER THE COUNTER MEDICATION, Place 2 drops into both eyes daily. Reported on 08/27/2015, Disp: , Rfl:  .  pantoprazole (PROTONIX) 20 MG tablet, Take 1 tablet (20 mg total) by mouth 2 (two) times daily., Disp: 32 tablet, Rfl: 0  Current Facility-Administered Medications:  .  triamcinolone acetonide (KENALOG) 10 MG/ML injection 10 mg, 10 mg, Other, Once, Trula Slade, DPM   Allergies  Allergen Reactions  . Other Anaphylaxis and Other (See Comments)    Pt has fish allergy Pt has allergy to butter cookies  . Penicillins Rash and Other (See Comments)    Told MD that PCN reaction is rash and that she has taken amoxicillin before     Review of Systems  Constitutional: Negative for appetite change, chills and fever.  HENT: Positive for congestion, postnasal drip, rhinorrhea  and sneezing.   Respiratory: Positive for cough. Negative for chest tightness.   Cardiovascular: Negative for chest pain, palpitations and leg swelling.     Today's Vitals   10/19/18 1418  BP: (!) 180/110  Pulse: 87  Temp: 98.7 F (37.1 C)  TempSrc: Oral  SpO2: 98%  Weight: (!) 359 lb 12.8 oz (163.2 kg)  Height: 5' 2.3" (1.582 m)   Body mass index is 65.18 kg/m.   Objective:  Physical Exam  Today's Vitals   10/19/18 1418  BP: (!) 180/110  Pulse: 87  Temp: 98.7 F (37.1 C)  TempSrc: Oral  SpO2: 98%  Weight: (!) 359 lb 12.8 oz (163.2 kg)  Height: 5' 2.3" (1.582 m)   Body mass index is 65.18 kg/m.   Her weight is 17 lb more than 3 months ago.   Assessment And Plan:  1. Uncontrolled type 2 diabetes mellitus with hyperglycemia ([place])- chronic. - Amb ref to Medical Nutrition Therapy-MNT - glucose blood (CONTOUR TEST) test strip; To use check glucose bid  Dispense: 100 each; Refill: 12 - Hemoglobin A1c - Lipid Profile She was given a glucometer from here. FU 1 month. Needs to bring glucometer with readings.  2. Anxiety- Needs her xanax refilled. Still has 5 tablets left. Only needs it on occasion.  - ALPRAZolam (XANAX) 0.5 MG tablet; Take 1 tablet (0.5 mg total) by mouth 2 (two) times daily as needed for anxiety or sleep.  Dispense: 30 tablet; Refill: 0  3. Obstructive sleep apnea syndrome- chronic and untreated. May be contributing to her uncontrolled HTN - Ambulatory referral to Neurology  4. Essential hypertension- poorly controlled. I d/c the lisinopril and placed her on Entresto. Needs to do BP diaries and call is if they stay > 140/90.  - CMP14 + Anion Gap - CBC no Diff - Lipid Profile  5. Wheezing- acute. Resolved after the neb - ipratropium-albuterol (DUONEB) 0.5-2.5 (3) MG/3ML nebulizer solution 3 mL  6. BMI 60.0-69.9, adult ([place])- chronic. She is working on loosing wt.  7. Class 2 severe obesity with serious comorbidity in adult, unspecified BMI,  unspecified obesity type ([place])- chronic. Working on loosing wt.   8. Seasonal allergic rhinitis due to pollen- placed on Zyrtec.       Shirelle Tootle RODRIGUEZ-SOUTHWORTH, [name]

## 2018-10-20 ENCOUNTER — Other Ambulatory Visit: Payer: Self-pay | Admitting: Internal Medicine

## 2018-10-20 LAB — CMP14 + ANION GAP
ALT: 10 IU/L (ref 0–32)
AST: 10 IU/L (ref 0–40)
Albumin/Globulin Ratio: 1.7 (ref 1.2–2.2)
Albumin: 4 g/dL (ref 3.8–4.8)
Alkaline Phosphatase: 67 IU/L (ref 39–117)
Anion Gap: 13 mmol/L (ref 10.0–18.0)
BUN/Creatinine Ratio: 15 (ref 9–23)
BUN: 12 mg/dL (ref 6–20)
Bilirubin Total: 0.3 mg/dL (ref 0.0–1.2)
CHLORIDE: 101 mmol/L (ref 96–106)
CO2: 25 mmol/L (ref 20–29)
Calcium: 8.9 mg/dL (ref 8.7–10.2)
Creatinine, Ser: 0.8 mg/dL (ref 0.57–1.00)
GFR calc Af Amer: 110 mL/min/{1.73_m2} (ref >59)
GFR calc non Af Amer: 95 mL/min/{1.73_m2} (ref >59)
Globulin, Total: 2.3 g/dL (ref 1.5–4.5)
Glucose: 144 mg/dL — ABNORMAL HIGH (ref 65–99)
Potassium: 4 mmol/L (ref 3.5–5.2)
Sodium: 139 mmol/L (ref 134–144)
Total Protein: 6.3 g/dL (ref 6.0–8.5)

## 2018-10-20 LAB — LIPID PANEL
Chol/HDL Ratio: 3.9 ratio (ref 0.0–4.4)
Cholesterol, Total: 126 mg/dL (ref 100–199)
HDL: 32 mg/dL — ABNORMAL LOW (ref >39)
LDL Calculated: 73 mg/dL (ref 0–99)
Triglycerides: 106 mg/dL (ref 0–149)
VLDL Cholesterol Cal: 21 mg/dL (ref 5–40)

## 2018-10-20 LAB — CBC
Hematocrit: 30.5 % — ABNORMAL LOW (ref 34.0–46.6)
Hemoglobin: 9.8 g/dL — ABNORMAL LOW (ref 11.1–15.9)
MCH: 24 pg — ABNORMAL LOW (ref 26.6–33.0)
MCHC: 32.1 g/dL (ref 31.5–35.7)
MCV: 75 fL — ABNORMAL LOW (ref 79–97)
Platelets: 378 10*3/uL (ref 150–450)
RBC: 4.09 x10E6/uL (ref 3.77–5.28)
RDW: 15.7 % — ABNORMAL HIGH (ref 11.7–15.4)
WBC: 8 10*3/uL (ref 3.4–10.8)

## 2018-10-20 LAB — HEMOGLOBIN A1C
ESTIMATED AVERAGE GLUCOSE: 200 mg/dL
Hgb A1c MFr Bld: 8.6 % — ABNORMAL HIGH (ref 4.8–5.6)

## 2018-10-20 MED ORDER — FERROUS SULFATE 325 (65 FE) MG PO TBEC: 325.0000 mg | DELAYED_RELEASE_TABLET | Freq: Three times a day (TID) | ORAL | 2 refills | Status: DC

## 2018-10-22 ENCOUNTER — Other Ambulatory Visit: Payer: Self-pay

## 2018-10-22 ENCOUNTER — Encounter: Payer: Self-pay | Admitting: Internal Medicine

## 2018-10-22 ENCOUNTER — Other Ambulatory Visit: Payer: Self-pay | Admitting: Internal Medicine

## 2018-10-22 MED ORDER — SACUBITRIL-VALSARTAN 49-51 MG PO TABS: 1.0000 | ORAL_TABLET | Freq: Two times a day (BID) | ORAL | 0 refills | Status: DC

## 2018-10-22 MED ORDER — AMLODIPINE BESYLATE 5 MG PO TABS: 5.0000 mg | ORAL_TABLET | Freq: Every day | ORAL | 11 refills | Status: DC

## 2018-10-22 NOTE — Addendum Note (Signed)
Addended by: Nicki Guadalajara on: 10/22/2018 07:35 PM   Modules accepted: Orders

## 2018-10-22 NOTE — Addendum Note (Signed)
Addended by: Nicki Guadalajara on: 10/22/2018 07:31 PM   Modules accepted: Orders

## 2018-10-25 ENCOUNTER — Ambulatory Visit: Payer: Medicaid Other | Admitting: Nurse Practitioner

## 2018-10-27 DIAGNOSIS — F411 Generalized anxiety disorder: Secondary | ICD-10-CM | POA: Diagnosis not present

## 2018-10-27 DIAGNOSIS — F332 Major depressive disorder, recurrent severe without psychotic features: Secondary | ICD-10-CM | POA: Diagnosis not present

## 2018-11-04 ENCOUNTER — Other Ambulatory Visit: Payer: Self-pay | Admitting: Internal Medicine

## 2018-11-04 NOTE — Progress Notes (Signed)
I spoke with [facility] call tracking # [id number] Tyler Aas was authorized. Auth # is [id number] 11/04/2018- 10/30/2019.   ===View-only below this line=== ----- Message ----- From: Octavio Manns Sent: 10/27/2018   8:21 AM EDT To: Shelby Mattocks, [name]  [phone] is the number you dont need the paper ----- Message ----- From: Colin Benton Sent: 10/27/2018   7:18 AM EDT To: Octavio Manns  If you would please fill out a form or give me the phone number to talk to whoever wants me to talk to get this authorized.  Per her old chart, looks like she was placed on Metformin, but maximized this dose and DM is still under control, Tied Ozempic at some time, and no longer on this, then placed on Tresiba.  Thanks,        Sunday Spillers

## 2018-11-11 LAB — SPECIMEN STATUS REPORT

## 2018-11-11 LAB — T4, FREE: Free T4: 1.13 ng/dL (ref 0.82–1.77)

## 2018-11-11 LAB — TSH: TSH: 0.837 u[IU]/mL (ref 0.450–4.500)

## 2018-11-11 LAB — T3, FREE: T3, Free: 2.5 pg/mL (ref 2.0–4.4)

## 2018-11-17 ENCOUNTER — Ambulatory Visit: Payer: Medicaid Other | Admitting: Nurse Practitioner

## 2018-11-17 ENCOUNTER — Other Ambulatory Visit: Payer: Self-pay

## 2018-12-21 ENCOUNTER — Other Ambulatory Visit: Payer: Self-pay

## 2018-12-21 ENCOUNTER — Encounter: Payer: Self-pay | Admitting: Nurse Practitioner

## 2018-12-21 ENCOUNTER — Ambulatory Visit: Payer: Medicaid Other | Admitting: Internal Medicine

## 2018-12-21 ENCOUNTER — Ambulatory Visit (INDEPENDENT_AMBULATORY_CARE_PROVIDER_SITE_OTHER): Payer: Medicaid Other | Admitting: Nurse Practitioner

## 2018-12-21 DIAGNOSIS — I10 Essential (primary) hypertension: Secondary | ICD-10-CM

## 2018-12-21 DIAGNOSIS — E1165 Type 2 diabetes mellitus with hyperglycemia: Secondary | ICD-10-CM | POA: Diagnosis not present

## 2018-12-21 DIAGNOSIS — D649 Anemia, unspecified: Secondary | ICD-10-CM | POA: Diagnosis not present

## 2018-12-21 MED ORDER — INSULIN DEGLUDEC 100 UNIT/ML ~~LOC~~ SOPN: 25.0000 [IU] | PEN_INJECTOR | Freq: Every day | SUBCUTANEOUS | 5 refills | Status: DC

## 2018-12-21 MED ORDER — EXENATIDE ER 2 MG ~~LOC~~ PEN: 2.0000 mg | PEN_INJECTOR | SUBCUTANEOUS | 3 refills | Status: DC

## 2018-12-21 NOTE — Progress Notes (Signed)
Virtual Visit via Video (Doxy.me)    This visit type was conducted due to national recommendations for restrictions regarding the COVID-19 Pandemic (e.g. social distancing) in an effort to limit this patient's exposure and mitigate transmission in our community.  Patients identity confirmed using two different identifiers.  This format is felt to be most appropriate for this patient at this time.  All issues noted in this document were discussed and addressed.  No physical exam was performed (except for noted visual exam findings with Video Visits).    Date:  12/30/2018   ID:  [name], DOB 1980/11/07, MRN [id number]  Patient Location:  Home - spoke with [name]  Provider location:   Office   Chief Complaint:  Diabetes follow up  History of Present Illness:    [name] is a 38 y.o. female who presents via video conferencing for a telehealth visit today.    The patient does not have symptoms concerning for COVID-19 infection (fever, chills, cough, or new shortness of breath).   Diabetes  She presents for her follow-up diabetic visit. She has type 2 diabetes mellitus. There are no hypoglycemic associated symptoms. Pertinent negatives for hypoglycemia include no confusion or dizziness. Pertinent negatives for diabetes include no blurred vision and no chest pain. Symptoms are stable.     Past Medical History:  Diagnosis Date  . Abnormal Pap smear    f/u wnl  . Asthma   . Fibroid   . Gestational diabetes   . Hypertension   . Morbid obesity ([place])   . Sleep apnea   . Urinary tract infection    Past Surgical History:  Procedure Laterality Date  . CESAREAN SECTION    . DILATION AND CURETTAGE OF UTERUS    . TUBAL LIGATION       Current Meds  Medication Sig  . albuterol (PROVENTIL HFA;VENTOLIN HFA) 108 (90 BASE) MCG/ACT inhaler Inhale 2-3 puffs into the lungs every 6 (six) hours as needed for wheezing. Asthma/SOB  . ALPRAZolam (XANAX) 0.5 MG tablet Take 1  tablet (0.5 mg total) by mouth 2 (two) times daily as needed for anxiety or sleep.  . blood glucose meter kit and supplies KIT Dispense based on patient and insurance preference. Use up to four times daily as directed. (FOR ICD-9 250.00, 250.01). Machine was given here at the office.  . cetirizine (ZYRTEC ALLERGY) 10 MG tablet Take 1 tablet (10 mg total) by mouth daily.  [name] EPINEPHrine (EPIPEN 2-PAK) 0.3 mg/0.3 mL IJ SOAJ injection Inject 0.3 mLs (0.3 mg total) into the muscle once as needed (for severe allergic reaction). CAll 911 immediately if you have to use this medicine  . Exenatide ER (BYDUREON) 2 MG PEN Inject 2 mg into the skin once a week.  Noe Gens Smart Blood Glucose Lancets MISC Check glucose bid  . ferrous sulfate 325 (65 FE) MG EC tablet Take 1 tablet (325 mg total) by mouth 3 (three) times daily with meals.  [name] glucose blood (CONTOUR TEST) test strip To use check glucose bid  . insulin degludec (TRESIBA FLEXTOUCH) 100 UNIT/ML SOPN FlexTouch Pen Inject 0.25 mLs (25 Units total) into the skin daily.  . meloxicam (MOBIC) 15 MG tablet Take 1 tablet (15 mg total) by mouth daily.  . metFORMIN (GLUCOPHAGE) 500 MG tablet Take 2 tablets (1,000 mg total) by mouth 2 (two) times daily with a meal.  . OVER THE COUNTER MEDICATION Place 2 drops into both eyes daily. Reported on 08/27/2015  .  sacubitril-valsartan (ENTRESTO) 49-51 MG Take 1 tablet by mouth 2 (two) times daily.  . [DISCONTINUED] Exenatide ER (BYDUREON) 2 MG PEN Inject 2 mg into the skin once a week.  . [DISCONTINUED] gabapentin (NEURONTIN) 100 MG capsule Take 1 capsule (100 mg total) by mouth at bedtime.  . [DISCONTINUED] insulin degludec (TRESIBA FLEXTOUCH) 100 UNIT/ML SOPN FlexTouch Pen Inject 0.1 mLs (10 Units total) into the skin daily.     Allergies:   Other and Penicillins   Social History   Tobacco Use  . Smoking status: Current Some Day Smoker  . Smokeless tobacco: Never Used  Substance Use Topics  . Alcohol use: No     Comment: occasional  . Drug use: No     Family Hx: The patient's family history includes Allergies in her mother; Asthma in her mother; Diabetes in her father, maternal grandmother, and mother; Heart disease in her maternal grandmother; Hypertension in her mother. There is no history of Anesthesia problems, Hypotension, Malignant hyperthermia, Pseudochol deficiency, or Hearing loss.  ROS:   Please see the history of present illness.    Review of Systems  Constitutional: Negative for fever.  Eyes: Negative for blurred vision.  Cardiovascular: Negative.  Negative for chest pain and palpitations.  Neurological: Negative for dizziness and tingling.  Psychiatric/Behavioral: Negative for confusion.    All other systems reviewed and are negative.   Labs/Other Tests and Data Reviewed:    Recent Labs: 10/19/2018: ALT 10; BUN 12; Creatinine, Ser 0.80; Potassium 4.0; Sodium 139; TSH 0.837 12/22/2018: Hemoglobin 10.5; Platelets 411   Recent Lipid Panel Lab Results  Component Value Date/Time   CHOL 126 10/19/2018 03:16 PM   TRIG 106 10/19/2018 03:16 PM   HDL 32 (L) 10/19/2018 03:16 PM   CHOLHDL 3.9 10/19/2018 03:16 PM   LDLCALC 73 10/19/2018 03:16 PM    Wt Readings from Last 3 Encounters:  10/19/18 (!) 359 lb 12.8 oz (163.2 kg)  07/19/18 (!) 342 lb 6.4 oz (155.3 kg)  05/07/18 (!) 350 lb (158.8 kg)     Exam:    Vital Signs:  LMP 12/04/2018 (Approximate)     Physical Exam  Constitutional: She is oriented to person, place, and time. No distress.  Pulmonary/Chest: Effort normal.  Neurological: She is alert and oriented to person, place, and time.  Psychiatric: Mood, memory, affect and judgment normal.    ASSESSMENT & PLAN:    1. Anemia, unspecified type  Chronic  Will check her iron studies  2. Essential hypertension . No blood pressure this visit . CMP ordered to check renal function.  . Continue with current medications . The importance of regular exercise and dietary  modification was stressed to the patient.  . Stressed importance of losing ten percent of her body weight to help with B/P control.  . The weight loss would help with decreasing cardiac and cancer risk as well.   3. Uncontrolled type 2 diabetes mellitus with hyperglycemia (H[facility])  Chronic, poorly controlled  She is to come in tomorrow for education on how to use Bydureon  Continue with current medications pending labs  Encouraged to limit intake of sugary foods and drinks  Encouraged to increase physical activity to 150 minutes per week   COVID-19 Education: The signs and symptoms of COVID-19 were discussed with the patient and how to seek care for testing (follow up with PCP or arrange E-visit).  The importance of social distancing was discussed today.  Patient Risk:   After full review of this patients  clinical status, I feel that they are at least moderate risk at this time.  Time:   Today, I have spent 12 minutes/ seconds with the patient with telehealth technology discussing above diagnoses.     Medication Adjustments/Labs and Tests Ordered: Current medicines are reviewed at length with the patient today.  Concerns regarding medicines are outlined above.   Tests Ordered: Orders Placed This Encounter  Procedures  . Hemoglobin A1c  . CBC with Diff   Medication Changes: Meds ordered this encounter  Medications  . insulin degludec (TRESIBA FLEXTOUCH) 100 UNIT/ML SOPN FlexTouch Pen    Sig: Inject 0.25 mLs (25 Units total) into the skin daily.    Dispense:  5 pen    Refill:  5  . Exenatide ER (BYDUREON) 2 MG PEN    Sig: Inject 2 mg into the skin once a week.    Dispense:  4 each    Refill:  3    Disposition:  Follow up in 3 month(s)  Signed, Minette Brine, FNP

## 2018-12-22 ENCOUNTER — Other Ambulatory Visit: Payer: Self-pay

## 2018-12-22 ENCOUNTER — Other Ambulatory Visit: Payer: Medicaid Other

## 2018-12-22 ENCOUNTER — Ambulatory Visit: Payer: Medicaid Other

## 2018-12-22 DIAGNOSIS — E1165 Type 2 diabetes mellitus with hyperglycemia: Secondary | ICD-10-CM | POA: Diagnosis not present

## 2018-12-22 DIAGNOSIS — D649 Anemia, unspecified: Secondary | ICD-10-CM | POA: Diagnosis not present

## 2018-12-23 ENCOUNTER — Other Ambulatory Visit: Payer: Self-pay | Admitting: Nurse Practitioner

## 2018-12-23 DIAGNOSIS — E1165 Type 2 diabetes mellitus with hyperglycemia: Secondary | ICD-10-CM | POA: Diagnosis not present

## 2018-12-23 DIAGNOSIS — D649 Anemia, unspecified: Secondary | ICD-10-CM | POA: Diagnosis not present

## 2018-12-23 LAB — CBC WITH DIFFERENTIAL/PLATELET
Basophils Absolute: 0 10*3/uL (ref 0.0–0.2)
Basos: 0 %
EOS (ABSOLUTE): 0.3 10*3/uL (ref 0.0–0.4)
Eos: 3 %
Hematocrit: 34.9 % (ref 34.0–46.6)
Hemoglobin: 10.5 g/dL — ABNORMAL LOW (ref 11.1–15.9)
Immature Grans (Abs): 0 10*3/uL (ref 0.0–0.1)
Immature Granulocytes: 0 %
Lymphocytes Absolute: 2.4 10*3/uL (ref 0.7–3.1)
Lymphs: 25 %
MCH: 22.7 pg — ABNORMAL LOW (ref 26.6–33.0)
MCHC: 30.1 g/dL — ABNORMAL LOW (ref 31.5–35.7)
MCV: 76 fL — ABNORMAL LOW (ref 79–97)
Monocytes Absolute: 0.5 10*3/uL (ref 0.1–0.9)
Monocytes: 6 %
Neutrophils Absolute: 6 10*3/uL (ref 1.4–7.0)
Neutrophils: 66 %
Platelets: 411 10*3/uL (ref 150–450)
RBC: 4.62 x10E6/uL (ref 3.77–5.28)
RDW: 17.6 % — ABNORMAL HIGH (ref 11.7–15.4)
WBC: 9.3 10*3/uL (ref 3.4–10.8)

## 2018-12-23 LAB — HEMOGLOBIN A1C
Est. average glucose Bld gHb Est-mCnc: 194 mg/dL
Hgb A1c MFr Bld: 8.4 % — ABNORMAL HIGH (ref 4.8–5.6)

## 2018-12-24 LAB — IRON AND TIBC
Iron Saturation: 5 % — CL (ref 15–55)
Iron: 24 ug/dL — ABNORMAL LOW (ref 27–159)
Total Iron Binding Capacity: 471 ug/dL — ABNORMAL HIGH (ref 250–450)
UIBC: 447 ug/dL — ABNORMAL HIGH (ref 131–425)

## 2018-12-24 LAB — FERRITIN: Ferritin: 17 ng/mL (ref 15–150)

## 2018-12-29 ENCOUNTER — Ambulatory Visit: Payer: Medicaid Other

## 2018-12-30 ENCOUNTER — Encounter: Payer: Self-pay | Admitting: Nurse Practitioner

## 2018-12-31 ENCOUNTER — Telehealth: Payer: Self-pay

## 2018-12-31 NOTE — Telephone Encounter (Signed)
-----   Message from Minette Brine, [place] sent at 12/30/2018 10:22 PM EDT ----- Your iron levels are low are you taking an iron supplement daily, if not you need to take iron 325 mg daily and make sure you are having good bowel movements. You may need to take a stool softner

## 2018-12-31 NOTE — Telephone Encounter (Signed)
1st attempt to give lab results  

## 2019-01-12 ENCOUNTER — Ambulatory Visit (HOSPITAL_COMMUNITY)
Admission: EM | Admit: 2019-01-12 | Discharge: 2019-01-12 | Disposition: A | Discharge status: Home / Self Care | Payer: Medicaid Other | Attending: Family Medicine | Admitting: Family Medicine

## 2019-01-12 ENCOUNTER — Other Ambulatory Visit: Payer: Self-pay

## 2019-01-12 ENCOUNTER — Encounter (HOSPITAL_COMMUNITY): Payer: Self-pay

## 2019-01-12 DIAGNOSIS — S46911A Strain of unspecified muscle, fascia and tendon at shoulder and upper arm level, right arm, initial encounter: Secondary | ICD-10-CM | POA: Diagnosis not present

## 2019-01-12 MED ORDER — CYCLOBENZAPRINE HCL 10 MG PO TABS: ORAL_TABLET | ORAL | 0 refills | Status: DC

## 2019-01-12 MED ORDER — DICLOFENAC SODIUM 75 MG PO TBEC: 75.0000 mg | DELAYED_RELEASE_TABLET | Freq: Two times a day (BID) | ORAL | 0 refills | Status: DC
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
## 2019-01-17 DIAGNOSIS — H5213 Myopia, bilateral: Secondary | ICD-10-CM | POA: Diagnosis not present

## 2019-03-23 ENCOUNTER — Ambulatory Visit: Payer: Medicaid Other | Admitting: Nurse Practitioner

## 2019-03-29 ENCOUNTER — Other Ambulatory Visit: Payer: Self-pay

## 2019-03-29 ENCOUNTER — Encounter: Payer: Self-pay | Admitting: Nurse Practitioner

## 2019-03-29 ENCOUNTER — Ambulatory Visit (INDEPENDENT_AMBULATORY_CARE_PROVIDER_SITE_OTHER): Payer: Medicaid Other | Admitting: Nurse Practitioner

## 2019-03-29 VITALS — BP 126/80 | HR 93 | Temp 98.9°F | Ht 64.8 in | Wt 341.1 lb

## 2019-03-29 DIAGNOSIS — Z6841 Body Mass Index (BMI) 40.0 and over, adult: Secondary | ICD-10-CM

## 2019-03-29 DIAGNOSIS — F419 Anxiety disorder, unspecified: Secondary | ICD-10-CM | POA: Diagnosis not present

## 2019-03-29 DIAGNOSIS — G4733 Obstructive sleep apnea (adult) (pediatric): Secondary | ICD-10-CM | POA: Diagnosis not present

## 2019-03-29 DIAGNOSIS — D649 Anemia, unspecified: Secondary | ICD-10-CM | POA: Diagnosis not present

## 2019-03-29 DIAGNOSIS — E1165 Type 2 diabetes mellitus with hyperglycemia: Secondary | ICD-10-CM

## 2019-03-29 DIAGNOSIS — Z113 Encounter for screening for infections with a predominantly sexual mode of transmission: Secondary | ICD-10-CM

## 2019-03-29 DIAGNOSIS — I10 Essential (primary) hypertension: Secondary | ICD-10-CM | POA: Diagnosis not present

## 2019-03-29 LAB — POCT URINALYSIS DIPSTICK
Bilirubin, UA: NEGATIVE
Glucose, UA: NEGATIVE
Ketones, UA: NEGATIVE
Leukocytes, UA: NEGATIVE
Nitrite, UA: NEGATIVE
Protein, UA: NEGATIVE
Spec Grav, UA: 1.025 (ref 1.010–1.025)
Urobilinogen, UA: 0.2 E.U./dL
pH, UA: 6 (ref 5.0–8.0)

## 2019-03-29 LAB — POCT UA - MICROALBUMIN
Albumin/Creatinine Ratio, Urine, POC: 30
Creatinine, POC: 300 mg/dL
Microalbumin Ur, POC: 30 mg/L

## 2019-03-29 MED ORDER — ALPRAZOLAM 0.5 MG PO TABS: 0.5000 mg | ORAL_TABLET | Freq: Two times a day (BID) | ORAL | 0 refills | Status: DC | PRN

## 2019-03-29 MED ORDER — METFORMIN HCL 1000 MG PO TABS: 1000.0000 mg | ORAL_TABLET | Freq: Two times a day (BID) | ORAL | 1 refills | Status: DC

## 2019-03-29 NOTE — Progress Notes (Signed)
Subjective:     Patient ID: [name] , female    DOB: 24-Jun-1981 , 38 y.o.   MRN: [id number]   Chief Complaint  Patient presents with  . Diabetes    HPI  Wt Readings from Last 3 Encounters: 03/29/19 : (!) 341 lb 1.6 oz (154.7 kg) 01/12/19 : (!) 350 lb (158.8 kg) 10/19/18 : (!) 359 lb 12.8 oz (163.2 kg)  She is not eating breads and cut back on sodas.     Diabetes She presents for her follow-up diabetic visit. She has type 2 diabetes mellitus. There are no hypoglycemic associated symptoms. Pertinent negatives for hypoglycemia include no confusion, dizziness or headaches. Pertinent negatives for diabetes include no blurred vision, no chest pain and no fatigue. Symptoms are stable.     Past Medical History:  Diagnosis Date  . Abnormal Pap smear    f/u wnl  . Asthma   . Fibroid   . Gestational diabetes   . Hypertension   . Morbid obesity ([place])   . Sleep apnea   . Urinary tract infection      Family History  Problem Relation Age of Onset  . Diabetes Father   . Allergies Mother   . Asthma Mother   . Hypertension Mother   . Diabetes Mother   . Diabetes Maternal Grandmother   . Heart disease Maternal Grandmother   . Anesthesia problems Neg Hx   . Hypotension Neg Hx   . Malignant hyperthermia Neg Hx   . Pseudochol deficiency Neg Hx   . Hearing loss Neg Hx      Current Outpatient Medications:  .  albuterol (PROVENTIL HFA;VENTOLIN HFA) 108 (90 BASE) MCG/ACT inhaler, Inhale 2-3 puffs into the lungs every 6 (six) hours as needed for wheezing. Asthma/SOB, Disp: , Rfl:  .  ALPRAZolam (XANAX) 0.5 MG tablet, Take 1 tablet (0.5 mg total) by mouth 2 (two) times daily as needed for anxiety or sleep., Disp: 30 tablet, Rfl: 0 .  blood glucose meter kit and supplies KIT, Dispense based on patient and insurance preference. Use up to four times daily as directed. (FOR ICD-9 250.00, 250.01). Machine was given here at the office., Disp: 1 each, Rfl: 0 .  cetirizine (ZYRTEC  ALLERGY) 10 MG tablet, Take 1 tablet (10 mg total) by mouth daily., Disp: 90 tablet, Rfl: 0 .  EPINEPHrine (EPIPEN 2-PAK) 0.3 mg/0.3 mL IJ SOAJ injection, Inject 0.3 mLs (0.3 mg total) into the muscle once as needed (for severe allergic reaction). CAll 911 immediately if you have to use this medicine, Disp: 2 Device, Rfl: 1 .  Exenatide ER (BYDUREON) 2 MG PEN, Inject 2 mg into the skin once a week., Disp: 4 each, Rfl: 3 .  Ez Smart Blood Glucose Lancets MISC, Check glucose bid, Disp: 100 each, Rfl: 11 .  ferrous sulfate 325 (65 FE) MG EC tablet, Take 1 tablet (325 mg total) by mouth 3 (three) times daily with meals., Disp: 90 tablet, Rfl: 2 .  glucose blood (CONTOUR TEST) test strip, To use check glucose bid, Disp: 100 each, Rfl: 12 .  insulin degludec (TRESIBA FLEXTOUCH) 100 UNIT/ML SOPN FlexTouch Pen, Inject 0.25 mLs (25 Units total) into the skin daily., Disp: 5 pen, Rfl: 5 .  metFORMIN (GLUCOPHAGE) 500 MG tablet, Take 2 tablets (1,000 mg total) by mouth 2 (two) times daily with a meal., Disp: 180 tablet, Rfl: 1 .  OVER THE COUNTER MEDICATION, Place 2 drops into both eyes daily. Reported on 08/27/2015, Disp: ,  Rfl:  .  pantoprazole (PROTONIX) 20 MG tablet, Take 1 tablet (20 mg total) by mouth 2 (two) times daily., Disp: 32 tablet, Rfl: 0 .  sacubitril-valsartan (ENTRESTO) 49-51 MG, Take 1 tablet by mouth 2 (two) times daily., Disp: 60 tablet, Rfl: 0 .  cyclobenzaprine (FLEXERIL) 10 MG tablet, Take 1 tablet by mouth before bed as needed for muscle spasm. Warning: May cause drowsiness. (Patient not taking: Reported on 03/29/2019), Disp: 10 tablet, Rfl: 0 .  diclofenac (VOLTAREN) 75 MG EC tablet, Take 1 tablet (75 mg total) by mouth 2 (two) times daily. (Patient not taking: Reported on 03/29/2019), Disp: 14 tablet, Rfl: 0 .  meloxicam (MOBIC) 15 MG tablet, Take 1 tablet (15 mg total) by mouth daily. (Patient not taking: Reported on 03/29/2019), Disp: 30 tablet, Rfl: 0   Allergies  Allergen Reactions  .  Other Anaphylaxis and Other (See Comments)    Pt has fish allergy Pt has allergy to butter cookies  . Penicillins Rash and Other (See Comments)    Told MD that PCN reaction is rash and that she has taken amoxicillin before     Review of Systems  Constitutional: Negative.  Negative for fatigue.  Eyes: Negative for blurred vision.  Respiratory: Negative.   Cardiovascular: Negative for chest pain, palpitations and leg swelling.  Gastrointestinal: Negative.   Neurological: Negative for dizziness and headaches.  Psychiatric/Behavioral: Negative for confusion.     Today's Vitals   03/29/19 1502  BP: 126/80  Pulse: 93  Temp: 98.9 F (37.2 C)  TempSrc: Oral  Weight: (!) 341 lb 1.6 oz (154.7 kg)  Height: 5' 4.8" (1.646 m)  PainSc: 0-No pain   Body mass index is 57.11 kg/m.   Objective:  Physical Exam Vitals signs reviewed.  Constitutional:      Appearance: Normal appearance. She is obese.  Cardiovascular:     Rate and Rhythm: Normal rate and regular rhythm.     Pulses: Normal pulses.     Heart sounds: Normal heart sounds. No murmur.  Pulmonary:     Effort: Pulmonary effort is normal.     Breath sounds: Normal breath sounds.  Skin:    General: Skin is warm and dry.     Capillary Refill: Capillary refill takes less than 2 seconds.  Neurological:     General: No focal deficit present.     Mental Status: She is alert and oriented to person, place, and time.  Psychiatric:        Mood and Affect: Mood normal.        Behavior: Behavior normal.        Thought Content: Thought content normal.        Judgment: Judgment normal.         Assessment And Plan:     1. Uncontrolled type 2 diabetes mellitus with hyperglycemia (HCC)  Chronic, poorly controlled  Continue with current medications pending lab results  She has not been checking her blood sugars due to not having a machine  Encouraged to limit intake of sugary foods and drinks  Encouraged to increase physical  activity to 150 minutes per week - metFORMIN (GLUCOPHAGE) 1000 MG tablet; Take 1 tablet (1,000 mg total) by mouth 2 (two) times daily with a meal.  Dispense: 180 tablet; Refill: 1 - Referral to [facility] Services - Hemoglobin A1c - CMP14 + Anion Gap  2. Anemia, unspecified type  Will recheck iron levels - Iron, TIBC and Ferritin Panel - CBC  3. Essential  hypertension . B/P is well controlled.  . CMP ordered to check renal function.  . The importance of regular exercise and dietary modification was stressed to the patient.  . Stressed importance of losing ten percent of her body weight to help with B/P control.  . The weight loss would help with decreasing cardiac and cancer risk as well.  - CBC  4. Class 3 severe obesity due to excess calories with serious comorbidity and body mass index (BMI) of 50.0 to 59.9 in adult [facility]  Chronic  Discussed healthy diet and regular exercise options   Encouraged to exercise at least 150 minutes per week with 2 days of strength training  Will order sleep study as this could be a factor to her weight issue - Ambulatory referral to Sleep Studies  5. Obstructive sleep apnea syndrome  There is a history in her chart of having obstructive sleep apnea  6. Anxiety  Chronic  Will provide limited amount alprazolam - ALPRAZolam (XANAX) 0.5 MG tablet; Take 1 tablet (0.5 mg total) by mouth 2 (two) times daily as needed for anxiety or sleep.  Dispense: 30 tablet; Refill: 0     Minette Brine, FNP    THE PATIENT IS ENCOURAGED TO PRACTICE SOCIAL DISTANCING DUE TO THE COVID-19 PANDEMIC.

## 2019-03-30 ENCOUNTER — Ambulatory Visit: Payer: Self-pay

## 2019-03-30 DIAGNOSIS — E1165 Type 2 diabetes mellitus with hyperglycemia: Secondary | ICD-10-CM

## 2019-03-30 DIAGNOSIS — Z6841 Body Mass Index (BMI) 40.0 and over, adult: Secondary | ICD-10-CM

## 2019-03-30 DIAGNOSIS — I10 Essential (primary) hypertension: Secondary | ICD-10-CM

## 2019-03-30 DIAGNOSIS — G4733 Obstructive sleep apnea (adult) (pediatric): Secondary | ICD-10-CM

## 2019-03-30 LAB — CMP14 + ANION GAP
ALT: 12 IU/L (ref 0–32)
AST: 12 IU/L (ref 0–40)
Albumin/Globulin Ratio: 1.6 (ref 1.2–2.2)
Albumin: 4.4 g/dL (ref 3.8–4.8)
Alkaline Phosphatase: 70 IU/L (ref 39–117)
Anion Gap: 13 mmol/L (ref 10.0–18.0)
BUN/Creatinine Ratio: 14 (ref 9–23)
BUN: 10 mg/dL (ref 6–20)
Bilirubin Total: <0.2 mg/dL (ref 0.0–1.2)
CO2: 22 mmol/L (ref 20–29)
Calcium: 9.5 mg/dL (ref 8.7–10.2)
Chloride: 103 mmol/L (ref 96–106)
Creatinine, Ser: 0.74 mg/dL (ref 0.57–1.00)
GFR calc Af Amer: 120 mL/min/{1.73_m2} (ref >59)
GFR calc non Af Amer: 104 mL/min/{1.73_m2} (ref >59)
Globulin, Total: 2.8 g/dL (ref 1.5–4.5)
Glucose: 110 mg/dL — ABNORMAL HIGH (ref 65–99)
Potassium: 4.2 mmol/L (ref 3.5–5.2)
Sodium: 138 mmol/L (ref 134–144)
Total Protein: 7.2 g/dL (ref 6.0–8.5)

## 2019-03-30 LAB — CBC
Hematocrit: 36.3 % (ref 34.0–46.6)
Hemoglobin: 11.5 g/dL (ref 11.1–15.9)
MCH: 24.2 pg — ABNORMAL LOW (ref 26.6–33.0)
MCHC: 31.7 g/dL (ref 31.5–35.7)
MCV: 76 fL — ABNORMAL LOW (ref 79–97)
Platelets: 402 10*3/uL (ref 150–450)
RBC: 4.76 x10E6/uL (ref 3.77–5.28)
RDW: 15.4 % (ref 11.7–15.4)
WBC: 9.8 10*3/uL (ref 3.4–10.8)

## 2019-03-30 LAB — HEMOGLOBIN A1C
Est. average glucose Bld gHb Est-mCnc: 209 mg/dL
Hgb A1c MFr Bld: 8.9 % — ABNORMAL HIGH (ref 4.8–5.6)

## 2019-03-30 LAB — IRON,TIBC AND FERRITIN PANEL
Ferritin: 8 ng/mL — ABNORMAL LOW (ref 15–150)
Iron Saturation: 8 % — CL (ref 15–55)
Iron: 40 ug/dL (ref 27–159)
Total Iron Binding Capacity: 477 ug/dL — ABNORMAL HIGH (ref 250–450)
UIBC: 437 ug/dL — ABNORMAL HIGH (ref 131–425)

## 2019-03-30 LAB — HIV ANTIBODY (ROUTINE TESTING W REFLEX): HIV Screen 4th Generation wRfx: NONREACTIVE

## 2019-03-30 NOTE — Patient Instructions (Addendum)
Visit Information  Goals Addressed            This Visit's Progress     Patient Stated   . "I need help getting a meter" (pt-stated)       Current Barriers:  . Lacks knowledge of process to obtain diabetic testing supplies  Clinical Social Work Clinical Goal(s):  [name] Over the next two weeks the patient will work with embedded PharmD as directed by CCM SW to address diabetic supply needs.  Interventions: . Patient interviewed and appropriate assessments performed, the patient reports she does not have a meter or lancets to monitor blood sugar . Advised the patient to expect a phone call from embedded PharmD [name] to assist with patient stated goal . Collaboration with [name] regarding patient enrollment status and self reported pharmacy need  Patient Self Care Activities:  . Currently UNABLE TO independently navigate benefits to obtain testing supplies  Initial goal documentation     . "I want to weigh less than 300 pounds by the end of the year" (pt-stated)       Current Barriers:  . Lacks knowledge of safe diet and exercise conducive to weight loss that accomodates health conditions . Increased stress . COVID 19 causing changes to current routines with children  Clinical Social Work Clinical Goal(s):  [name] Over the next 90 days the patient will work with CCM team to address weight loss goal  CCM SW Interventions: Completed 03/30/2019 . Patient interviewed and appropriate assessments performed . Determined the patient has been working to lose weight but has been met with barriers since the COVID 19 pandemic. The patient reports being a mother of three (twin 37 year olds and 16 37 year old). The patient reports her children have recently started attending the [facility] for remote learning . Discussed barriers to weight loss aside from COVID 19 "I eat when I am stressed" . Collaboration with RN Case Electronics engineer Little regarding patients enrollment and current weight loss goal  . Advised the patient CCM SW would send some resource information for her to review . Assigned the patient an EMMI education tool on "stress" as well as "weight loss" . Scheduled follow up call to the patient over the next month to assess patient progress  Patient Self Care Activities:  . Attends all scheduled provider appointments . Performs ADL's independently . Calls provider office for new concerns or questions  Initial goal documentation        [name] was given information about Care Management services today including:  1. Care Management services include personalized support from designated clinical staff supervised by her physician, including individualized plan of care and coordination with other care providers 2. 24/7 contact phone numbers for assistance for urgent and routine care needs. 3. The patient may stop CCM services at any time (effective at the end of the month) by phone call to the office staff.  Patient agreed to services and verbal consent obtained.   The patient verbalized understanding of instructions provided today and declined a print copy of patient instruction materials.   SW will follow up with the patient over the next month.  [name], BSW, CDP Social Worker, Certified Dementia Practitioner [name] [facility] [phone]      Healthy Eating Following a healthy eating pattern may help you to achieve and maintain a healthy body weight, reduce the risk of chronic disease, and live a long and productive life. It is important to follow a  healthy eating pattern at an appropriate calorie level for your body. Your nutritional needs should be met primarily through food by choosing a variety of nutrient-rich foods. What are tips for following this plan? Reading food labels  Read labels and choose the following: ? Reduced or low sodium. ? Juices with 100% fruit juice. ? Foods with low saturated fats and high polyunsaturated and  monounsaturated fats. ? Foods with whole grains, such as whole wheat, cracked wheat, brown rice, and wild rice. ? Whole grains that are fortified with folic acid. This is recommended for women who are pregnant or who want to become pregnant.  Read labels and avoid the following: ? Foods with a lot of added sugars. These include foods that contain brown sugar, corn sweetener, corn syrup, dextrose, fructose, glucose, high-fructose corn syrup, honey, invert sugar, lactose, malt syrup, maltose, molasses, raw sugar, sucrose, trehalose, or turbinado sugar.  Do not eat more than the following amounts of added sugar per day:  6 teaspoons (25 g) for women.  9 teaspoons (38 g) for men. ? Foods that contain processed or refined starches and grains. ? Refined grain products, such as white flour, degermed cornmeal, white bread, and white rice. Shopping  Choose nutrient-rich snacks, such as vegetables, whole fruits, and nuts. Avoid high-calorie and high-sugar snacks, such as potato chips, fruit snacks, and candy.  Use oil-based dressings and spreads on foods instead of solid fats such as butter, stick margarine, or cream cheese.  Limit pre-made sauces, mixes, and "instant" products such as flavored rice, instant noodles, and ready-made pasta.  Try more plant-protein sources, such as tofu, tempeh, black beans, edamame, lentils, nuts, and seeds.  Explore eating plans such as the Mediterranean diet or vegetarian diet. Cooking  Use oil to saut or stir-fry foods instead of solid fats such as butter, stick margarine, or lard.  Try baking, boiling, grilling, or broiling instead of frying.  Remove the fatty part of meats before cooking.  Steam vegetables in water or broth. Meal planning   At meals, imagine dividing your plate into fourths: ? One-half of your plate is fruits and vegetables. ? One-fourth of your plate is whole grains. ? One-fourth of your plate is protein, especially lean meats,  poultry, eggs, tofu, beans, or nuts.  Include low-fat dairy as part of your daily diet. Lifestyle  Choose healthy options in all settings, including home, work, school, restaurants, or stores.  Prepare your food safely: ? Wash your hands after handling raw meats. ? Keep food preparation surfaces clean by regularly washing with hot, soapy water. ? Keep raw meats separate from ready-to-eat foods, such as fruits and vegetables. ? Cook seafood, meat, poultry, and eggs to the recommended internal temperature. ? Store foods at safe temperatures. In general:  Keep cold foods at 76F (4.4C) or below.  Keep hot foods at 176F (60C) or above.  Keep your freezer at [facility] (-17.8C) or below.  Foods are no longer safe to eat when they have been between the temperatures of 40-176F (4.4-60C) for more than 2 hours. What foods should I eat? Fruits Aim to eat 2 cup-equivalents of fresh, canned (in natural juice), or frozen fruits each day. Examples of 1 cup-equivalent of fruit include 1 small apple, 8 large strawberries, 1 cup canned fruit,  cup dried fruit, or 1 cup 100% juice. Vegetables Aim to eat 2-3 cup-equivalents of fresh and frozen vegetables each day, including different varieties and colors. Examples of 1 cup-equivalent of vegetables include 2 medium carrots, 2  cups raw, leafy greens, 1 cup chopped vegetable (raw or cooked), or 1 medium baked potato. Grains Aim to eat 6 ounce-equivalents of whole grains each day. Examples of 1 ounce-equivalent of grains include 1 slice of bread, 1 cup ready-to-eat cereal, 3 cups popcorn, or  cup cooked rice, pasta, or cereal. Meats and other proteins Aim to eat 5-6 ounce-equivalents of protein each day. Examples of 1 ounce-equivalent of protein include 1 egg, 1/2 cup nuts or seeds, or 1 tablespoon (16 g) peanut butter. A cut of meat or fish that is the size of a deck of cards is about 3-4 ounce-equivalents.  Of the protein you eat each week, try to have  at least 8 ounces come from seafood. This includes salmon, trout, herring, and anchovies. Dairy Aim to eat 3 cup-equivalents of fat-free or low-fat dairy each day. Examples of 1 cup-equivalent of dairy include 1 cup (240 mL) milk, 8 ounces (250 g) yogurt, 1 ounces (44 g) natural cheese, or 1 cup (240 mL) fortified soy milk. Fats and oils  Aim for about 5 teaspoons (21 g) per day. Choose monounsaturated fats, such as canola and olive oils, avocados, peanut butter, and most nuts, or polyunsaturated fats, such as sunflower, corn, and soybean oils, walnuts, pine nuts, sesame seeds, sunflower seeds, and flaxseed. Beverages  Aim for six 8-oz glasses of water per day. Limit coffee to three to five 8-oz cups per day.  Limit caffeinated beverages that have added calories, such as soda and energy drinks.  Limit alcohol intake to no more than 1 drink a day for nonpregnant women and 2 drinks a day for men. One drink equals 12 oz of beer (355 mL), 5 oz of wine (148 mL), or 1 oz of hard liquor (44 mL). Seasoning and other foods  Avoid adding excess amounts of salt to your foods. Try flavoring foods with herbs and spices instead of salt.  Avoid adding sugar to foods.  Try using oil-based dressings, sauces, and spreads instead of solid fats. This information is based on general [place] nutrition guidelines. For more information, visit [employer].es. Exact amounts may vary based on your nutrition needs. Summary  A healthy eating plan may help you to maintain a healthy weight, reduce the risk of chronic diseases, and stay active throughout your life.  Plan your meals. Make sure you eat the right portions of a variety of nutrient-rich foods.  Try baking, boiling, grilling, or broiling instead of frying.  Choose healthy options in all settings, including home, work, school, restaurants, or stores. This information is not intended to replace advice given to you by your health care provider. Make sure you  discuss any questions you have with your health care provider. Document Released: 11/02/2017 Document Revised: 11/02/2017 Document Reviewed: 11/02/2017 Elsevier Patient Education  2020 [place] is a normal reaction to life events. Stress is what you feel when life demands more than you are used to, or more than you think you can handle. Some stress can be useful, such as studying for a test or meeting a deadline at work. Stress that occurs too often or for too long can cause problems. It can affect your emotional health and interfere with relationships and normal daily activities. Too much stress can weaken your body's defense system (immune system) and increase your risk for physical illness. If you already have a medical problem, stress can make it worse. What are the causes? All sorts of life events can cause stress.  An event that causes stress for one person may not be stressful for another person. Major life events, whether positive or negative, commonly cause stress. Examples include:  Losing a job or starting a new job.  Losing a loved one.  Moving to a new town or home.  Getting married or divorced.  Having a baby.  Injury or illness. Less obvious life events can also cause stress, especially if they occur day after day or in combination with each other. Examples include:  Working long hours.  Driving in traffic.  Caring for children.  Being in debt.  Being in a difficult relationship. What are the signs or symptoms? Stress can cause emotional symptoms, including:  Anxiety. This is feeling worried, afraid, on edge, overwhelmed, or out of control.  Anger, including irritation or impatience.  Depression. This is feeling sad, down, helpless, or guilty.  Trouble focusing, remembering, or making decisions. Stress can cause physical symptoms, including:  Aches and pains. These may affect your head, neck, back, stomach, or other areas of your body.   Tight muscles or a clenched jaw.  Low energy.  Trouble sleeping. Stress can cause unhealthy behaviors, including:  Eating to feel better (overeating) or skipping meals.  Working too much or putting off tasks.  Smoking, drinking alcohol, or using drugs to feel better. How is this diagnosed? Stress is diagnosed through an assessment by your health care provider. He or she may diagnose this condition based on:  Your symptoms and any stressful life events.  Your medical history.  Tests to rule out other causes of your symptoms. Depending on your condition, your health care provider may refer you to a specialist for further evaluation. How is this treated?  Stress management techniques are the recommended treatment for stress. Medicine is not typically recommended for the treatment of stress. Techniques to reduce your reaction to stressful life events include:  Stress identification. Monitor yourself for symptoms of stress and identify what causes stress for you. These skills may help you to avoid or prepare for stressful events.  Time management. Set your priorities, keep a calendar of events, and learn to say "no." Taking these actions can help you avoid making too many commitments. Techniques for coping with stress include:  Rethinking the problem. Try to think realistically about stressful events rather than ignoring them or overreacting. Try to find the positives in a stressful situation rather than focusing on the negatives.  Exercise. Physical exercise can release both physical and emotional tension. The key is to find a form of exercise that you enjoy and do it regularly.  Relaxation techniques. These relax the body and mind. The key is to find one or more that you enjoy and use the technique(s) regularly. Examples include: ? Meditation, deep breathing, or progressive relaxation techniques. ? Yoga or tai chi. ? Biofeedback, mindfulness techniques, or journaling. ?  Listening to music, being out in nature, or participating in other hobbies.  Practicing a healthy lifestyle. Eat a balanced diet, drink plenty of water, limit or avoid caffeine, and get plenty of sleep.  Having a strong support network. Spend time with family, friends, or other people you enjoy being around. Express your feelings and talk things over with someone you trust. Counseling or talk therapy with a mental health professional may be helpful if you are having trouble managing stress on your own. Follow these instructions at home: Lifestyle   Avoid drugs.  Do not use any products that contain nicotine or tobacco, such as  cigarettes and e-cigarettes. If you need help quitting, ask your health care provider.  Limit alcohol intake to no more than 1 drink a day for nonpregnant women and 2 drinks a day for men. One drink equals 12 oz of beer, 5 oz of wine, or 1 oz of hard liquor.  Do not use alcohol or drugs to relax.  Eat a balanced diet that includes fresh fruits and vegetables, whole grains, lean meats, fish, eggs, and beans, and low-fat dairy. Avoid processed foods and foods high in added fat, sugar, and salt.  Exercise at least 30 minutes on 5 or more days each week.  Get 7-8 hours of sleep each night. General instructions   Practice stress management techniques as discussed with your health care provider.  Drink enough fluid to keep your urine clear or pale yellow.  Take over-the-counter and prescription medicines only as told by your health care provider.  Keep all follow-up visits as told by your health care provider. This is important. Contact a health care provider if:  Your symptoms get worse.  You have new symptoms.  You feel overwhelmed by your problems and can no longer manage them on your own. Get help right away if:  You have thoughts of hurting yourself or others. If you ever feel like you may hurt yourself or others, or have thoughts about taking your  own life, get help right away. You can go to your nearest emergency department or call:  Your local emergency services (911 in the [place]  A suicide crisis helpline, such as the [place] at 207 214 5657. This is open 24 hours a day. Summary  Stress is a normal reaction to life events. It can cause problems if it happens too often or for too long.  Practicing stress management techniques is the best way to treat stress.  Counseling or talk therapy with a mental health professional may be helpful if you are having trouble managing stress on your own. This information is not intended to replace advice given to you by your health care provider. Make sure you discuss any questions you have with your health care provider. Document Released: 01/14/2001 Document Revised: 07/03/2017 Document Reviewed: 09/10/2016 Elsevier Patient Education  2020 [employer].

## 2019-03-30 NOTE — Chronic Care Management (AMB) (Addendum)
Care Management   Initial Visit Note  03/30/2019 Name: [name] MRN: [id number] DOB: Jun 29, 1981  Referred by: Minette Brine FNP Reason for referral : Care Coordination (CC RNCM Case Collaboration )   [name] is a 38 y.o. year old female who is a primary care patient of Minette Brine FNP. The care management team was consulted for assistance with chronic disease management and care coordination needs.   Review of patient status, including review of consultants reports, relevant laboratory and other test results, and collaboration with appropriate care team members and the patient's provider was performed as part of comprehensive patient evaluation and provision of chronic care management services.    I initiated and established the plan of care for [name] during one on one collaboration with my clinical care management colleague [name] BSW who is also engaged with this patient to address social work needs.   Outpatient Encounter Medications as of 03/30/2019  Medication Sig  . albuterol (PROVENTIL HFA;VENTOLIN HFA) 108 (90 BASE) MCG/ACT inhaler Inhale 2-3 puffs into the lungs every 6 (six) hours as needed for wheezing. Asthma/SOB  . ALPRAZolam (XANAX) 0.5 MG tablet Take 1 tablet (0.5 mg total) by mouth 2 (two) times daily as needed for anxiety or sleep.  . cetirizine (ZYRTEC ALLERGY) 10 MG tablet Take 1 tablet (10 mg total) by mouth daily.  . cyclobenzaprine (FLEXERIL) 10 MG tablet Take 1 tablet by mouth before bed as needed for muscle spasm. Warning: May cause drowsiness. (Patient not taking: Reported on 03/29/2019)  . diclofenac (VOLTAREN) 75 MG EC tablet Take 1 tablet (75 mg total) by mouth 2 (two) times daily. (Patient not taking: Reported on 03/29/2019)  . EPINEPHrine (EPIPEN 2-PAK) 0.3 mg/0.3 mL IJ SOAJ injection Inject 0.3 mLs (0.3 mg total) into the muscle once as needed (for severe allergic reaction). CAll 911 immediately if you have to use this medicine  .  Exenatide ER (BYDUREON) 2 MG PEN Inject 2 mg into the skin once a week.  . ferrous sulfate 325 (65 FE) MG EC tablet Take 1 tablet (325 mg total) by mouth 3 (three) times daily with meals.  . insulin degludec (TRESIBA FLEXTOUCH) 100 UNIT/ML SOPN FlexTouch Pen Inject 0.25 mLs (25 Units total) into the skin daily.  . meloxicam (MOBIC) 15 MG tablet Take 1 tablet (15 mg total) by mouth daily. (Patient not taking: Reported on 03/29/2019)  . metFORMIN (GLUCOPHAGE) 1000 MG tablet Take 1 tablet (1,000 mg total) by mouth 2 (two) times daily with a meal.  . OVER THE COUNTER MEDICATION Place 2 drops into both eyes daily. Reported on 08/27/2015  . pantoprazole (PROTONIX) 20 MG tablet Take 1 tablet (20 mg total) by mouth 2 (two) times daily.  . sacubitril-valsartan (ENTRESTO) 49-51 MG Take 1 tablet by mouth 2 (two) times daily.   No facility-administered encounter medications on file as of 03/30/2019.      Goals Addressed    . Assist with Chronic Care Management and Care Coordination needs       Current Barriers:  [name] Knowledge Barriers related to resources and support available to address needs related to Chronic Care Management and Community Resources   Case Manager Clinical Goal(s):  [name] Over the next 30 days, patient will work with the CCM team to address needs related to Chronic disease management and Care Coordination  Interventions:  . Collaborated with BSW and initiated plan of care to address needs related to weight loss, DM, and Care Coordination.  Patient Self Care Activities:  . Attends all scheduled provider appointments . Calls provider office for new concerns or questions  Initial goal documentation         Telephone follow up appointment with care management team member scheduled for: 04/21/19  Barb Merino, RN, BSN, CCM Care Management Coordinator [facility]  Direct Phone: [phone]

## 2019-03-31 NOTE — Chronic Care Management (AMB) (Signed)
Care Management   Social Work General Note  03/30/2019 Name: [name] MRN: [id number] DOB: [birth year]  [name] is a 38 y.o. year old female who is a primary care patient of Minette Brine, [place]. The CCM was consulted to assist the patient with [employer] .   [name] was given information about Care Management services today including:  1. Care Management services includes personalized support from designated clinical staff supervised by her physician, including individualized plan of care and coordination with other care providers 2. 24/7 contact phone numbers for assistance for urgent and routine care needs. 3. The patient may stop case management services at any time by phone call to the office staff.  Patient agreed to services and verbal consent obtained.   Review of patient status, including review of consultants reports, relevant laboratory and other test results, and collaboration with appropriate care team members and the patient's provider was performed as part of comprehensive patient evaluation and provision of chronic care management services.    SDOH (Social Determinants of Health) screening performed today. The patient does not indicate any resource challenges at this time. The patient is currently receiving unemployment benefits and assistance through food and nutrition services. The patient is currently on the section 8 wait list.   Outpatient Encounter Medications as of 03/30/2019  Medication Sig   albuterol (PROVENTIL HFA;VENTOLIN HFA) 108 (90 BASE) MCG/ACT inhaler Inhale 2-3 puffs into the lungs every 6 (six) hours as needed for wheezing. Asthma/SOB   ALPRAZolam (XANAX) 0.5 MG tablet Take 1 tablet (0.5 mg total) by mouth 2 (two) times daily as needed for anxiety or sleep.   cetirizine (ZYRTEC ALLERGY) 10 MG tablet Take 1 tablet (10 mg total) by mouth daily.   cyclobenzaprine (FLEXERIL) 10 MG tablet Take 1 tablet by mouth before bed as needed for  muscle spasm. Warning: May cause drowsiness. (Patient not taking: Reported on 03/29/2019)   diclofenac (VOLTAREN) 75 MG EC tablet Take 1 tablet (75 mg total) by mouth 2 (two) times daily. (Patient not taking: Reported on 03/29/2019)   EPINEPHrine (EPIPEN 2-PAK) 0.3 mg/0.3 mL IJ SOAJ injection Inject 0.3 mLs (0.3 mg total) into the muscle once as needed (for severe allergic reaction). CAll 911 immediately if you have to use this medicine   Exenatide ER (BYDUREON) 2 MG PEN Inject 2 mg into the skin once a week.   ferrous sulfate 325 (65 FE) MG EC tablet Take 1 tablet (325 mg total) by mouth 3 (three) times daily with meals.   insulin degludec (TRESIBA FLEXTOUCH) 100 UNIT/ML SOPN FlexTouch Pen Inject 0.25 mLs (25 Units total) into the skin daily.   meloxicam (MOBIC) 15 MG tablet Take 1 tablet (15 mg total) by mouth daily. (Patient not taking: Reported on 03/29/2019)   metFORMIN (GLUCOPHAGE) 1000 MG tablet Take 1 tablet (1,000 mg total) by mouth 2 (two) times daily with a meal.   OVER THE COUNTER MEDICATION Place 2 drops into both eyes daily. Reported on 08/27/2015   sacubitril-valsartan (ENTRESTO) 49-51 MG Take 1 tablet by mouth 2 (two) times daily.   No facility-administered encounter medications on file as of 03/30/2019.     Goals Addressed            This Visit's Progress     Patient Stated    "I need help getting a meter" (pt-stated)       Current Barriers:   Lacks knowledge of process to obtain diabetic testing supplies  Clinical Social Work Clinical  Goal(s):   Over the next two weeks the patient will work with embedded PharmD as directed by CCM SW to address diabetic supply needs.  Interventions:  Patient interviewed and appropriate assessments performed, the patient reports she does not have a meter or lancets to monitor blood sugar  Advised the patient to expect a phone call from embedded PharmD [name] to assist with patient stated goal  Collaboration with Mrs  [name] regarding patient enrollment status and self reported pharmacy need  Patient Self Care Activities:   Currently UNABLE TO independently navigate benefits to obtain testing supplies  Initial goal documentation      "I want to weigh less than 300 pounds by the end of the year" (pt-stated)       Current Barriers:   Lacks knowledge of safe diet and exercise conducive to weight loss that accomodates health conditions  Increased stress  COVID 19 causing changes to current routines with children  Clinical Social Work Clinical Goal(s):   Over the next 90 days the patient will work with CCM team to address weight loss goal  CCM SW Interventions: Completed 03/30/2019  Patient interviewed and appropriate assessments performed  Determined the patient has been working to lose weight but has been met with barriers since the COVID 19 pandemic. The patient reports being a mother of three (twin 67 year olds and 38 37 year old). The patient reports her children have recently started attending the [employer] for remote learning  Discussed barriers to weight loss aside from [place] 19 "I eat when I am stressed"  Collaboration with RN Case Manager [name] regarding patients enrollment and current weight loss goal  Advised the patient CCM SW would send some resource information for her to review  Assigned the patient an EMMI education tool on "stress" as well as "weight loss"  Scheduled follow up call to the patient over the next month to assess patient progress  Patient Self Care Activities:   Attends all scheduled provider appointments  Performs ADL's independently  Calls provider office for new concerns or questions  Initial goal documentation         Follow Up Plan: SW will follow up with patient by phone over the next month.       [name], BSW, CDP Social Worker, Certified Dementia Practitioner [place] [facility] [place] [facility] [phone]

## 2019-04-06 ENCOUNTER — Other Ambulatory Visit: Payer: Self-pay | Admitting: Nurse Practitioner

## 2019-04-06 DIAGNOSIS — E1165 Type 2 diabetes mellitus with hyperglycemia: Secondary | ICD-10-CM

## 2019-04-06 DIAGNOSIS — N920 Excessive and frequent menstruation with regular cycle: Secondary | ICD-10-CM

## 2019-04-06 DIAGNOSIS — D649 Anemia, unspecified: Secondary | ICD-10-CM

## 2019-04-06 MED ORDER — OZEMPIC (0.25 OR 0.5 MG/DOSE) 2 MG/1.5ML ~~LOC~~ SOPN: 0.5000 mg | PEN_INJECTOR | SUBCUTANEOUS | 3 refills | Status: DC

## 2019-04-06 MED ORDER — POLYSACCHARIDE IRON COMPLEX 150 MG PO CAPS: 150.0000 mg | ORAL_CAPSULE | Freq: Every day | ORAL | 2 refills | Status: DC

## 2019-04-07 ENCOUNTER — Institutional Professional Consult (permissible substitution): Payer: Medicaid Other | Admitting: Neurology

## 2019-04-12 ENCOUNTER — Telehealth: Payer: Self-pay

## 2019-04-15 ENCOUNTER — Encounter (HOSPITAL_COMMUNITY): Payer: Self-pay

## 2019-04-15 ENCOUNTER — Ambulatory Visit (HOSPITAL_COMMUNITY)
Admission: EM | Admit: 2019-04-15 | Discharge: 2019-04-15 | Disposition: A | Discharge status: Home / Self Care | Payer: Medicaid Other | Attending: Emergency Medicine | Admitting: Emergency Medicine

## 2019-04-15 ENCOUNTER — Other Ambulatory Visit: Payer: Self-pay

## 2019-04-15 DIAGNOSIS — E119 Type 2 diabetes mellitus without complications: Secondary | ICD-10-CM | POA: Diagnosis not present

## 2019-04-15 DIAGNOSIS — G4733 Obstructive sleep apnea (adult) (pediatric): Secondary | ICD-10-CM | POA: Diagnosis not present

## 2019-04-15 DIAGNOSIS — J069 Acute upper respiratory infection, unspecified: Secondary | ICD-10-CM | POA: Insufficient documentation

## 2019-04-15 DIAGNOSIS — F329 Major depressive disorder, single episode, unspecified: Secondary | ICD-10-CM | POA: Diagnosis not present

## 2019-04-15 DIAGNOSIS — Z20828 Contact with and (suspected) exposure to other viral communicable diseases: Secondary | ICD-10-CM | POA: Insufficient documentation

## 2019-04-15 DIAGNOSIS — Z79899 Other long term (current) drug therapy: Secondary | ICD-10-CM | POA: Insufficient documentation

## 2019-04-15 DIAGNOSIS — Z794 Long term (current) use of insulin: Secondary | ICD-10-CM | POA: Diagnosis not present

## 2019-04-15 DIAGNOSIS — D649 Anemia, unspecified: Secondary | ICD-10-CM | POA: Diagnosis not present

## 2019-04-15 DIAGNOSIS — F419 Anxiety disorder, unspecified: Secondary | ICD-10-CM | POA: Diagnosis not present

## 2019-04-15 DIAGNOSIS — Z791 Long term (current) use of non-steroidal anti-inflammatories (NSAID): Secondary | ICD-10-CM | POA: Insufficient documentation

## 2019-04-15 DIAGNOSIS — B9789 Other viral agents as the cause of diseases classified elsewhere: Secondary | ICD-10-CM | POA: Insufficient documentation

## 2019-04-15 DIAGNOSIS — R05 Cough: Secondary | ICD-10-CM

## 2019-04-15 DIAGNOSIS — I10 Essential (primary) hypertension: Secondary | ICD-10-CM | POA: Diagnosis not present

## 2019-04-15 DIAGNOSIS — J45909 Unspecified asthma, uncomplicated: Secondary | ICD-10-CM | POA: Insufficient documentation

## 2019-04-15 DIAGNOSIS — H5213 Myopia, bilateral: Secondary | ICD-10-CM | POA: Diagnosis not present

## 2019-04-15 DIAGNOSIS — Z6841 Body Mass Index (BMI) 40.0 and over, adult: Secondary | ICD-10-CM | POA: Diagnosis not present

## 2019-04-15 DIAGNOSIS — F17200 Nicotine dependence, unspecified, uncomplicated: Secondary | ICD-10-CM | POA: Diagnosis not present

## 2019-04-15 MED ORDER — IBUPROFEN 800 MG PO TABS
800.0000 mg | ORAL_TABLET | Freq: Once | ORAL | Status: AC
  Administered 2019-04-15: 800 mg via ORAL

## 2019-04-15 MED ORDER — ALBUTEROL SULFATE (5 MG/ML) 0.5% IN NEBU: 2.5000 mg | INHALATION_SOLUTION | Freq: Four times a day (QID) | RESPIRATORY_TRACT | 12 refills | Status: DC | PRN

## 2019-04-15 MED ORDER — IBUPROFEN 800 MG PO TABS
ORAL_TABLET | ORAL | Status: AC
  Filled 2019-04-15: qty 1

## 2019-04-15 NOTE — ED Triage Notes (Signed)
Pt presents with productive cough, nasal drainage, chest congestion, and wheezing X 7 days.  Pt has Hx of asthma and bronchitis

## 2019-04-15 NOTE — Discharge Instructions (Addendum)
Push fluids to ensure adequate hydration and keep secretions thin.  Tylenol and/or ibuprofen as needed for pain or fevers.  Nebulizer or inhaler as needed for wheezing or shortness of breath .  Continue with zyrtec and mucinex to help with symptoms. May continue with flonase daily as well.  Self isolate until results are back and negative.  Will notify you of any positive findings. You may monitor your results on your MyChart online as well.    If symptoms worsen or do not improve in the next week to return to be seen or to follow up with your PCP.

## 2019-04-15 NOTE — ED Provider Notes (Signed)
[facility]    CSN: [id number] Arrival date & time: 04/15/19  1442      History   Chief Complaint Chief Complaint  Patient presents with  . Appointment  . (2:50 Nasal Congestion, Chest Congestion, Wheezing,& Cough)    HPI [name] is a 38 y.o. female.   [name] presents with complaints of sinus pressure to nose and under left eye. Now with chest congestion. Worsened yesterday. Cough, productive of phlegm. mucinex and flonase do help some. Symptoms originally started 4 days ago. No fevers. She does feel shortness of breath. No chest pain. Shortness of breath with activity. No ear pain or sore throat. Tylenol cold and flu did help with symptoms as well. Has been taking zyrtec as well, which have helped. Inhaler helps for a few hours, then feels shortness of breath. Patients son with same symptoms. He is better. Going to school, she does not work outside of the home. History  Of htn, UTI, asthma, anemia.     ROS per HPI, negative if not otherwise mentioned.      Past Medical History:  Diagnosis Date  . Abnormal Pap smear    f/u wnl  . Asthma   . Fibroid   . Gestational diabetes   . Hypertension   . Morbid obesity ([place])   . Sleep apnea   . Urinary tract infection     Patient Active Problem List   Diagnosis Date Noted  . Anemia 12/21/2018  . Uncontrolled type 2 diabetes mellitus with hyperglycemia ([place]) 12/21/2018  . Anxiety 07/19/2018  . Depression 07/19/2018  . Essential hypertension 07/19/2018  . Fibroid uterus 09/17/2015  . Menorrhagia 08/27/2015  . Type 2 diabetes mellitus without complication, with long-term current use of insulin ([place]) 09/13/2012  . BMI 60.0-69.9, adult ([place]) 09/13/2012  . Asthma 09/13/2012  . History of high blood pressure 09/13/2012  . OSA (obstructive sleep apnea) 12/10/2010    Past Surgical History:  Procedure Laterality Date  . CESAREAN SECTION    . DILATION AND CURETTAGE OF UTERUS    . TUBAL LIGATION       OB History    Gravida  4   Para  2   Term  1   Preterm  1   AB  2   Living        SAB  2   TAB  0   Ectopic  0   Multiple  1   Live Births               Home Medications    Prior to Admission medications   Medication Sig Start Date End Date Taking? Authorizing Provider  albuterol (PROVENTIL HFA;VENTOLIN HFA) 108 (90 BASE) MCG/ACT inhaler Inhale 2-3 puffs into the lungs every 6 (six) hours as needed for wheezing. Asthma/SOB    [provider]  albuterol (PROVENTIL) (5 MG/ML) 0.5% nebulizer solution Take 0.5 mLs (2.5 mg total) by nebulization every 6 (six) hours as needed for wheezing or shortness of breath. 04/15/19   Zigmund Gottron, NP  ALPRAZolam Duanne Moron) 0.5 MG tablet Take 1 tablet (0.5 mg total) by mouth 2 (two) times daily as needed for anxiety or sleep. 03/29/19 03/28/20  Minette Brine, FNP  cetirizine (ZYRTEC ALLERGY) 10 MG tablet Take 1 tablet (10 mg total) by mouth daily. 10/19/18   Rodriguez-Southworth, Sunday Spillers, [name]  cyclobenzaprine (FLEXERIL) 10 MG tablet Take 1 tablet by mouth before bed as needed for muscle spasm. Warning: May cause drowsiness. Patient  not taking: Reported on 03/29/2019 01/12/19   Vanessa Kick, MD  diclofenac (VOLTAREN) 75 MG EC tablet Take 1 tablet (75 mg total) by mouth 2 (two) times daily. Patient not taking: Reported on 03/29/2019 01/12/19   Vanessa Kick, MD  EPINEPHrine (EPIPEN 2-PAK) 0.3 mg/0.3 mL IJ SOAJ injection Inject 0.3 mLs (0.3 mg total) into the muscle once as needed (for severe allergic reaction). CAll 911 immediately if you have to use this medicine 05/20/14   Harvie Heck, [name]  ferrous sulfate 325 (65 FE) MG EC tablet Take 1 tablet (325 mg total) by mouth 3 (three) times daily with meals. 10/20/18 10/20/19  Rodriguez-Southworth, Sunday Spillers, [name]  insulin degludec (TRESIBA FLEXTOUCH) 100 UNIT/ML SOPN FlexTouch Pen Inject 0.25 mLs (25 Units total) into the skin daily. 12/21/18   Minette Brine, FNP  iron polysaccharides  (NIFEREX) 150 MG capsule Take 1 capsule (150 mg total) by mouth daily. 04/06/19   Minette Brine, FNP  meloxicam (MOBIC) 15 MG tablet Take 1 tablet (15 mg total) by mouth daily. Patient not taking: Reported on 03/29/2019 09/16/18 09/16/19  Trula Slade, DPM  metFORMIN (GLUCOPHAGE) 1000 MG tablet Take 1 tablet (1,000 mg total) by mouth 2 (two) times daily with a meal. 03/29/19   Minette Brine, FNP  OVER THE COUNTER MEDICATION Place 2 drops into both eyes daily. Reported on 08/27/2015    [provider]  pantoprazole (PROTONIX) 20 MG tablet Take 1 tablet (20 mg total) by mouth 2 (two) times daily. 05/20/16 03/29/19  Gareth Morgan, MD  sacubitril-valsartan (ENTRESTO) 49-51 MG Take 1 tablet by mouth 2 (two) times daily. 10/22/18   Rodriguez-Southworth, Sunday Spillers, [name]  Semaglutide,0.25 or 0.5MG /DOS, (OZEMPIC, 0.25 OR 0.5 MG/DOSE,) 2 MG/1.5ML SOPN Inject 0.5 mg into the skin once a week. 04/06/19   Minette Brine, FNP    Family History Family History  Problem Relation Age of Onset  . Diabetes Father   . Allergies Mother   . Asthma Mother   . Hypertension Mother   . Diabetes Mother   . Diabetes Maternal Grandmother   . Heart disease Maternal Grandmother   . Anesthesia problems Neg Hx   . Hypotension Neg Hx   . Malignant hyperthermia Neg Hx   . Pseudochol deficiency Neg Hx   . Hearing loss Neg Hx     Social History Social History   Tobacco Use  . Smoking status: Current Some Day Smoker  . Smokeless tobacco: Never Used  Substance Use Topics  . Alcohol use: No    Comment: occasional  . Drug use: No     Allergies   Other and Penicillins   Review of Systems Review of Systems   Physical Exam Triage Vital Signs ED Triage Vitals  Enc Vitals Group     BP 04/15/19 1524 (!) 163/97     Pulse Rate 04/15/19 1524 95     Resp 04/15/19 1524 17     Temp 04/15/19 1524 98.4 F (36.9 C)     Temp Source 04/15/19 1524 Oral     SpO2 04/15/19 1524 98 %     Weight --      Height --       Head Circumference --      Peak Flow --      Pain Score 04/15/19 1527 4     Pain Loc --      Pain Edu? --      Excl. in [place]? --    No data found.  Updated Vital Signs BP [name])  163/97 (BP Location: Right Arm)   Pulse 95   Temp 98.4 F (36.9 C) (Oral)   Resp 17   LMP 03/17/2019   SpO2 98%   Visual Acuity Right Eye Distance:   Left Eye Distance:   Bilateral Distance:    Right Eye Near:   Left Eye Near:    Bilateral Near:     Physical Exam Constitutional:      General: She is not in acute distress.    Appearance: She is well-developed.  HENT:     Head: Normocephalic and atraumatic.     Right Ear: Tympanic membrane, ear canal and external ear normal.     Left Ear: Tympanic membrane, ear canal and external ear normal.     Nose: Nose normal.     Mouth/Throat:     Pharynx: Uvula midline.     Tonsils: No tonsillar exudate.  Eyes:     Conjunctiva/sclera: Conjunctivae normal.     Pupils: Pupils are equal, round, and reactive to light.  Cardiovascular:     Rate and Rhythm: Normal rate and regular rhythm.     Heart sounds: Normal heart sounds.  Pulmonary:     Effort: Pulmonary effort is normal.     Breath sounds: Normal breath sounds. No wheezing.     Comments: No cough throughout exam Skin:    General: Skin is warm and dry.  Neurological:     Mental Status: She is alert and oriented to person, place, and time.      UC Treatments / Results  Labs (all labs ordered are listed, but only abnormal results are displayed) Labs Reviewed  NOVEL CORONAVIRUS, NAA (HOSP ORDER, SEND-OUT TO REF LAB; TAT 18-24 HRS)    EKG   Radiology No results found.  Procedures Procedures (including critical care time)  Medications Ordered in UC Medications  ibuprofen (ADVIL) tablet 800 mg (800 mg Oral Given 04/15/19 1606)  ibuprofen (ADVIL) 800 MG tablet (has no administration in time range)    Initial Impression / Assessment and Plan / UC Course  I have reviewed the triage vital  signs and the nursing notes.  Pertinent labs & imaging results that were available during my care of the patient were reviewed by me and considered in my medical decision making (see chart for details).     Non toxic. Benign physical exam.  Afebrile. No increased work of breathing. No hypoxia or tachycardia. Alert, oriented, non toxic in appearance. Clear coherent speech without difficulty. No increased work of breathing visualized.  Supportive cares recommended. covid testing provided and pending. Encouraged isolation until results. Return precautions provided. Patient verbalized understanding and agreeable to plan.   Final Clinical Impressions(s) / UC Diagnoses   Final diagnoses:  Viral URI with cough     Discharge Instructions     Push fluids to ensure adequate hydration and keep secretions thin.  Tylenol and/or ibuprofen as needed for pain or fevers.  Nebulizer or inhaler as needed for wheezing or shortness of breath .  Continue with zyrtec and mucinex to help with symptoms. May continue with flonase daily as well.  Self isolate until results are back and negative.  Will notify you of any positive findings. You may monitor your results on your MyChart online as well.    If symptoms worsen or do not improve in the next week to return to be seen or to follow up with your PCP.       ED Prescriptions    Medication Sig Dispense  Auth. Provider   albuterol (PROVENTIL) (5 MG/ML) 0.5% nebulizer solution Take 0.5 mLs (2.5 mg total) by nebulization every 6 (six) hours as needed for wheezing or shortness of breath. 20 mL Augusto Gamble B, NP     Controlled Substance Prescriptions [place] Controlled Substance Registry consulted? Not Applicable   Zigmund Gottron, NP 04/16/19 1021

## 2019-04-17 LAB — NOVEL CORONAVIRUS, NAA (HOSP ORDER, SEND-OUT TO REF LAB; TAT 18-24 HRS)

## 2019-04-18 ENCOUNTER — Telehealth (HOSPITAL_COMMUNITY): Payer: Self-pay | Admitting: Emergency Medicine

## 2019-04-18 ENCOUNTER — Telehealth: Payer: Self-pay

## 2019-04-18 NOTE — Telephone Encounter (Signed)
Covid Test Most likely positive, contacted patient to inform her, pt will stay quarantined for ten days. All questions answered.

## 2019-04-19 ENCOUNTER — Other Ambulatory Visit: Payer: Self-pay

## 2019-04-19 ENCOUNTER — Ambulatory Visit (INDEPENDENT_AMBULATORY_CARE_PROVIDER_SITE_OTHER): Payer: Medicaid Other | Admitting: Nurse Practitioner

## 2019-04-19 ENCOUNTER — Encounter: Payer: Self-pay | Admitting: Nurse Practitioner

## 2019-04-19 VITALS — Wt 341.0 lb

## 2019-04-19 DIAGNOSIS — J069 Acute upper respiratory infection, unspecified: Secondary | ICD-10-CM | POA: Diagnosis not present

## 2019-04-19 MED ORDER — AZITHROMYCIN 250 MG PO TABS: ORAL_TABLET | ORAL | 0 refills | Status: AC

## 2019-04-19 NOTE — Progress Notes (Signed)
Virtual Visit via Video   This visit type was conducted due to national recommendations for restrictions regarding the COVID-19 Pandemic (e.g. social distancing) in an effort to limit this patient's exposure and mitigate transmission in our community.  Due to her co-morbid illnesses, this patient is at least at moderate risk for complications without adequate follow up.  This format is felt to be most appropriate for this patient at this time.  All issues noted in this document were discussed and addressed.  A limited physical exam was performed with this format.    This visit type was conducted due to national recommendations for restrictions regarding the COVID-19 Pandemic (e.g. social distancing) in an effort to limit this patient's exposure and mitigate transmission in our community.  Patients identity confirmed using two different identifiers.  This format is felt to be most appropriate for this patient at this time.  All issues noted in this document were discussed and addressed.  No physical exam was performed (except for noted visual exam findings with Video Visits).    Date:  04/27/2019   ID:  [name], DOB November 21, 1980, MRN [id number]  Patient Location:  Home -spoke with [name]  Provider location:   Office    Chief Complaint:  Increased congestion  History of Present Illness:    [name] is a 38 y.o. female who presents via video conferencing for a telehealth visit today.    The patient does have symptoms concerning for COVID-19 infection (fever, chills, cough, or new shortness of breath).   She had been taking flonase, tylenol cold and flu and cetirizine.  She took some of her son's prednisone.  She has a productive cough. Yellow and brown secretions.  Using her inhaler.  Symptoms started last Tuesday.  Went to urgent care on Friday.  Increase congestion.  She is using her albuterol inhaler twice a day. Denies having a cough.  She tells me her test for  coronavirus   URI  This is a new problem. The current episode started in the past 7 days. The problem has been gradually worsening. Pertinent negatives include no abdominal pain, coughing or headaches. She has tried inhaler use for the symptoms. The treatment provided mild relief.     Past Medical History:  Diagnosis Date  . Abnormal Pap smear    f/u wnl  . Asthma   . Fibroid   . Gestational diabetes   . Hypertension   . Morbid obesity ([place])   . Sleep apnea   . Urinary tract infection    Past Surgical History:  Procedure Laterality Date  . CESAREAN SECTION    . DILATION AND CURETTAGE OF UTERUS    . TUBAL LIGATION       Current Meds  Medication Sig  . albuterol (PROVENTIL HFA;VENTOLIN HFA) 108 (90 BASE) MCG/ACT inhaler Inhale 2-3 puffs into the lungs every 6 (six) hours as needed for wheezing. Asthma/SOB  . albuterol (PROVENTIL) (5 MG/ML) 0.5% nebulizer solution Take 0.5 mLs (2.5 mg total) by nebulization every 6 (six) hours as needed for wheezing or shortness of breath.  . ALPRAZolam (XANAX) 0.5 MG tablet Take 1 tablet (0.5 mg total) by mouth 2 (two) times daily as needed for anxiety or sleep.  . cetirizine (ZYRTEC ALLERGY) 10 MG tablet Take 1 tablet (10 mg total) by mouth daily.  [name] EPINEPHrine (EPIPEN 2-PAK) 0.3 mg/0.3 mL IJ SOAJ injection Inject 0.3 mLs (0.3 mg total) into the muscle once as needed (for severe allergic  reaction). CAll 911 immediately if you have to use this medicine  . ferrous sulfate 325 (65 FE) MG EC tablet Take 1 tablet (325 mg total) by mouth 3 (three) times daily with meals.  . iron polysaccharides (NIFEREX) 150 MG capsule Take 1 capsule (150 mg total) by mouth daily.  . metFORMIN (GLUCOPHAGE) 1000 MG tablet Take 1 tablet (1,000 mg total) by mouth 2 (two) times daily with a meal.  . OVER THE COUNTER MEDICATION Place 2 drops into both eyes daily. Reported on 08/27/2015  . Semaglutide,0.25 or 0.5MG /DOS, (OZEMPIC, 0.25 OR 0.5 MG/DOSE,) 2 MG/1.5ML SOPN Inject  0.5 mg into the skin once a week.  . [DISCONTINUED] insulin degludec (TRESIBA FLEXTOUCH) 100 UNIT/ML SOPN FlexTouch Pen Inject 0.25 mLs (25 Units total) into the skin daily.     Allergies:   Other and Penicillins   Social History   Tobacco Use  . Smoking status: Current Some Day Smoker  . Smokeless tobacco: Never Used  Substance Use Topics  . Alcohol use: No    Comment: occasional  . Drug use: No     Family Hx: The patient's family history includes Allergies in her mother; Asthma in her mother; Diabetes in her father, maternal grandmother, and mother; Heart disease in her maternal grandmother; Hypertension in her mother. There is no history of Anesthesia problems, Hypotension, Malignant hyperthermia, Pseudochol deficiency, or Hearing loss.  ROS:   Please see the history of present illness.    Review of Systems  Constitutional: Negative.   Respiratory: Negative for cough.   Cardiovascular: Negative.   Gastrointestinal: Negative for abdominal pain.  Neurological: Negative for dizziness and headaches.    All other systems reviewed and are negative.   Labs/Other Tests and Data Reviewed:    Recent Labs: 10/19/2018: TSH 0.837 03/29/2019: ALT 12; BUN 10; Creatinine, Ser 0.74; Hemoglobin 11.5; Platelets 402; Potassium 4.2; Sodium 138   Recent Lipid Panel Lab Results  Component Value Date/Time   CHOL 126 10/19/2018 03:16 PM   TRIG 106 10/19/2018 03:16 PM   HDL 32 (L) 10/19/2018 03:16 PM   CHOLHDL 3.9 10/19/2018 03:16 PM   LDLCALC 73 10/19/2018 03:16 PM    Wt Readings from Last 3 Encounters:  04/19/19 (!) 341 lb (154.7 kg)  03/29/19 (!) 341 lb 1.6 oz (154.7 kg)  01/12/19 (!) 350 lb (158.8 kg)     Exam:    Vital Signs:  Wt (!) 341 lb (154.7 kg)   LMP 03/14/2019 (Approximate)   BMI 57.10 kg/m     Physical Exam  Constitutional: She is oriented to person, place, and time and well-developed, well-nourished, and in no distress.  Pulmonary/Chest: Effort normal.   Neurological: She is alert and oriented to person, place, and time.  Psychiatric: Memory, affect and judgment normal.    ASSESSMENT & PLAN:    1. Viral upper respiratory tract infection  Encouraged to stay well hydrated  She is also to take the antibiotics until completely gone - azithromycin (ZITHROMAX) 250 MG tablet; Take 2 tablets (500 mg) on  Day 1,  followed by 1 tablet (250 mg) once daily on Days 2 through 5.  Dispense: 6 each; Refill: 0   COVID-19 Education: The signs and symptoms of COVID-19 were discussed with the patient and how to seek care for testing (follow up with PCP or arrange E-visit).  The importance of social distancing was discussed today.  Patient Risk:   After full review of this patients clinical status, I feel that they are at  least moderate risk at this time.  Time:   Today, I have spent 12 minutes/ seconds with the patient with telehealth technology discussing above diagnoses.     Medication Adjustments/Labs and Tests Ordered: Current medicines are reviewed at length with the patient today.  Concerns regarding medicines are outlined above.   Tests Ordered: No orders of the defined types were placed in this encounter.   Medication Changes: Meds ordered this encounter  Medications  . azithromycin (ZITHROMAX) 250 MG tablet    Sig: Take 2 tablets (500 mg) on  Day 1,  followed by 1 tablet (250 mg) once daily on Days 2 through 5.    Dispense:  6 each    Refill:  0    Disposition:  Follow up prn  Signed, Minette Brine, FNP

## 2019-04-21 ENCOUNTER — Telehealth: Payer: Self-pay

## 2019-04-21 ENCOUNTER — Ambulatory Visit: Payer: Self-pay

## 2019-04-21 DIAGNOSIS — E1165 Type 2 diabetes mellitus with hyperglycemia: Secondary | ICD-10-CM

## 2019-04-21 NOTE — Chronic Care Management (AMB) (Signed)
  Chronic Care Management   Social Work Note  04/21/2019 Name: [name] MRN: [id number] DOB: 03-19-1981  CCM SW placed an outbound call to the patient to assess progression of patient stated goals. Unfortunately, the patient was unable to talk at the time of SW call and reported she would call SW back at a later time.   Follow Up Plan: SW will follow up with the patient over the next week if no return call is received.  [name], BSW, CDP Social Worker, Certified Dementia Practitioner [place] [facility] [place] [facility] [phone]

## 2019-04-22 ENCOUNTER — Ambulatory Visit: Payer: Self-pay

## 2019-04-22 DIAGNOSIS — E1165 Type 2 diabetes mellitus with hyperglycemia: Secondary | ICD-10-CM

## 2019-04-22 DIAGNOSIS — D649 Anemia, unspecified: Secondary | ICD-10-CM

## 2019-04-25 NOTE — Patient Instructions (Signed)
Visit Information  Goals Addressed      Patient Stated   . "I am taking my iron but my iron is still low" (pt-stated)       Current Barriers:  [name] Knowledge Deficits related to diagnosis and treatment management for Anemia  Nurse Case Manager Clinical Goal(s):  [name] Over the next 30 days, patient will work with the CCM team and PCP provider to address needs related to improved treatment management of Anemia . Over the next 90 days, patient will increase her Iron Saturation and Ferritin levels to a normal range of 15  CC RN CM Interventions:  04/22/19 call completed with patient   . Evaluation of current treatment plan related to Anemia and patient's adherence to plan as established by provider. . Provided education to patient re: current Iron Sat and Ferritin ranges obtained on 03/29/19; discussed target range from 15-55%; discussed PCP may refer her to a Hematologist to better manage; provided patient basic disease process for potential diagnosis and treatment management of Anemia; educated patient on the best iron rich foods; educated patient on the best foods to help manage constipation secondary to taking iron . Reviewed medications with patient and discussed patient is adhering to taking her prescribed medication, Niferex 150 mg capsule daily . Collaborated with embedded PharmD [name] regarding further evaluation is needed to assess patient's treatment regimen for Anemia is appropriate and or if additional pharmacological recommendations are needed by Hematology . Discussed plans with patient for ongoing care management follow up and provided patient with direct contact information for care management team . Plan to send printed patient educational materials related to Anemia following next contact  Patient Self Care Activities:  . Self administers medications as prescribed . Attends all scheduled provider appointments . Calls pharmacy for medication refills . Attends church or other  social activities . Performs ADL's independently . Performs IADL's independently . Calls provider office for new concerns or questions  Initial goal documentation     . "I need help getting a meter" (pt-stated)       Current Barriers:  . Lacks knowledge of process to obtain diabetic testing supplies  Clinical Social Work Clinical Goal(s):  [name] Over the next two weeks the patient will work with embedded PharmD as directed by CCM SW to address diabetic supply needs.  CC RN CM Interventions: 04/22/19 call completed with patient   . Discussed patient currently has a One Touch zeroflex glucometer that she reports is over a year old and she currently out of glucose strips - therefore she is not checking her blood sugars  . Discussed patient would prefer a new Rx sent to her pharmacy - she plans to start Self monitoring her BS once the new glucometer is received . Forwarded in [employer] message to provider Minette Brine, FNP requesting a new Rx be sent to patient's pharmacy  Patient Self Care Activities:  . Currently UNABLE TO independently navigate benefits to obtain testing supplies  Please see past updates related to this goal by clicking on the "Past Updates" button in the selected goal      . "I would like to better manage my diabetes" (pt-stated)       Current Barriers:  [name] Knowledge Deficits related to diabetes disease process and Self health management  . Non-adherence to Self monitoring CBG's   Nurse Case Manager Clinical Goal(s):  [name] Over the next 30 days, patient will work with the [facility] team to address needs related to diabetes Self  Health management to help lower her A1C . Over the next 60 days, patient will demonstrate improved adherence to prescribed treatment plan for DMII as evidenced bypatient will verbalize adherence to Self monitoring her CBG daily, taking her diabetic medications exactly as prescribed w/o missed doses and adhering to her ADA diet as recommended . Over the next  90 days, patient will maintain or lower her current A1C of 8.4  CC RN CM Interventions:  04/22/19 call completed with patient   . Evaluation of current treatment plan related to Diabetes and patient's adherence to plan as established by provider. . Provided education to patient re: current A1C of 8.4; discussed target A1C of <7.0; educated patient on basic disease process of diabetes; educated on potential complications of uncontrolled diabetes including risk for diabetic retinopathy, diabetic neuropathy, heart disease, renal disease/failure, poor wound healing that can lead to amputation . Reviewed medications with patient and discussed patients prescribed diabetic regimen, Metformin 1000 mg bid meals; Ozempic 2 mg/1.5 mL, take 0.5 mg weekly injection . Collaborated with provider Minette Brine, FNP regarding the need for replacement of patient's glucometer . Discussed plans with patient for ongoing care management follow up and provided patient with direct contact information for care management team . Provided patient with printed educational materials related to "The Diabetes Diet", "Low Carb Snacks", "Manage Your Diabetes", "The Food Plate", "signs/symptoms Hypo/Hyperglycemia" . Advised patient, providing education and rationale, to check cbg before meals and record, calling the CCM team and or MD for findings outside established parameters.  <80 or >250  Patient Self Care Activities:  . Self administers medications as prescribed . Attends all scheduled provider appointments . Calls pharmacy for medication refills . Attends church or other social activities . Performs ADL's independently . Performs IADL's independently . Calls provider office for new concerns or questions  Please see past updates related to this goal by clicking on the "Past Updates" button in the selected goal        Other   . COMPLETED: Assist with Chronic Care Management and Care Coordination needs       Current  Barriers:  [name] Knowledge Barriers related to resources and support available to address needs related to Chronic Care Management and Community Resources   Case Manager Clinical Goal(s):  [name] Over the next 30 days, patient will work with the CCM team to address needs related to Chronic disease management and Care Coordination  CC RN CM Interventions:  04/22/19 call completed with patient  . Initial CC RN CM outreach completed with patient . See other goals established with patient for specific comorbidity's  Patient Self Care Activities:  . Attends all scheduled provider appointments . Calls provider office for new concerns or questions  Please see past updates related to this goal by clicking on the "Past Updates" button in the selected goal         The patient verbalized understanding of instructions provided today and declined a print copy of patient instruction materials.   Telephone follow up appointment with care management team member scheduled for: 05/18/19  Barb Merino, RN, BSN, CCM Care Management Coordinator [facility]  Direct Phone: [phone]

## 2019-04-25 NOTE — Chronic Care Management (AMB) (Signed)
Care Management   Initial Visit Note  04/22/2019 Name: [name] MRN: [id number] DOB: Jun 22, 1981  Referred by: [name], MD Reason for referral : Care Coordination (INITIAL CC RNCM telephone outreach )   [name] is a 38 y.o. year old female who is a primary care patient of [name], MD. The CCM team was consulted for assistance with chronic disease management and care coordination needs.   Review of patient status, including review of consultants reports, relevant laboratory and other test results, and collaboration with appropriate care team members and the patient's provider was performed as part of comprehensive patient evaluation and provision of chronic care management services.    SDOH (Social Determinants of Health) screening performed today: None. See Care Plan for related entries.   I spoke to [name] by telephone today to assess for CC RN needs and a care plan was established.    Medications: Outpatient Encounter Medications as of 04/22/2019  Medication Sig  . iron polysaccharides (NIFEREX) 150 MG capsule Take 1 capsule (150 mg total) by mouth daily.  . metFORMIN (GLUCOPHAGE) 1000 MG tablet Take 1 tablet (1,000 mg total) by mouth 2 (two) times daily with a meal.  . Semaglutide,0.25 or 0.5MG /DOS, (OZEMPIC, 0.25 OR 0.5 MG/DOSE,) 2 MG/1.5ML SOPN Inject 0.5 mg into the skin once a week.  [name] albuterol (PROVENTIL HFA;VENTOLIN HFA) 108 (90 BASE) MCG/ACT inhaler Inhale 2-3 puffs into the lungs every 6 (six) hours as needed for wheezing. Asthma/SOB  . albuterol (PROVENTIL) (5 MG/ML) 0.5% nebulizer solution Take 0.5 mLs (2.5 mg total) by nebulization every 6 (six) hours as needed for wheezing or shortness of breath.  . ALPRAZolam (XANAX) 0.5 MG tablet Take 1 tablet (0.5 mg total) by mouth 2 (two) times daily as needed for anxiety or sleep.  . [EXPIRED] azithromycin (ZITHROMAX) 250 MG tablet Take 2 tablets (500 mg) on  Day 1,  followed by 1 tablet (250 mg) once daily  on Days 2 through 5.  . cetirizine (ZYRTEC ALLERGY) 10 MG tablet Take 1 tablet (10 mg total) by mouth daily.  . cyclobenzaprine (FLEXERIL) 10 MG tablet Take 1 tablet by mouth before bed as needed for muscle spasm. Warning: May cause drowsiness. (Patient not taking: Reported on 03/29/2019)  . diclofenac (VOLTAREN) 75 MG EC tablet Take 1 tablet (75 mg total) by mouth 2 (two) times daily. (Patient not taking: Reported on 03/29/2019)  . EPINEPHrine (EPIPEN 2-PAK) 0.3 mg/0.3 mL IJ SOAJ injection Inject 0.3 mLs (0.3 mg total) into the muscle once as needed (for severe allergic reaction). CAll 911 immediately if you have to use this medicine  . ferrous sulfate 325 (65 FE) MG EC tablet Take 1 tablet (325 mg total) by mouth 3 (three) times daily with meals.  . meloxicam (MOBIC) 15 MG tablet Take 1 tablet (15 mg total) by mouth daily. (Patient not taking: Reported on 03/29/2019)  . OVER THE COUNTER MEDICATION Place 2 drops into both eyes daily. Reported on 08/27/2015  . pantoprazole (PROTONIX) 20 MG tablet Take 1 tablet (20 mg total) by mouth 2 (two) times daily.  . sacubitril-valsartan (ENTRESTO) 49-51 MG Take 1 tablet by mouth 2 (two) times daily. (Patient not taking: Reported on 04/19/2019)   No facility-administered encounter medications on file as of 04/22/2019.      Objective:  Lab Results  Component Value Date   HGBA1C 8.9 (H) 03/29/2019   HGBA1C 8.4 (H) 12/22/2018   HGBA1C 8.6 (H) 10/19/2018   Lab Results  Component  Value Date   MICROALBUR 30 03/29/2019   LDLCALC 73 10/19/2018   CREATININE 0.74 03/29/2019   BP Readings from Last 3 Encounters:  04/15/19 (!) 163/97  03/29/19 126/80  01/12/19 (!) 144/89    Goals Addressed      Patient Stated   . "I am taking my iron but my iron is still low" (pt-stated)       Current Barriers:  [name] Knowledge Deficits related to diagnosis and treatment management for Anemia  Nurse Case Manager Clinical Goal(s):  [name] Over the next 30 days, patient will work  with the CCM team and PCP provider to address needs related to improved treatment management of Anemia . Over the next 90 days, patient will increase her Iron Saturation and Ferritin levels to a normal range of 15  CC RN CM Interventions:  04/22/19 call completed with patient   . Evaluation of current treatment plan related to Anemia and patient's adherence to plan as established by provider. . Provided education to patient re: current Iron Sat and Ferritin ranges obtained on 03/29/19; discussed target range from 15-55%; discussed PCP may refer her to a Hematologist to better manage; provided patient basic disease process for potential diagnosis and treatment management of Anemia; educated patient on the best iron rich foods; educated patient on the best foods to help manage constipation secondary to taking iron . Reviewed medications with patient and discussed patient is adhering to taking her prescribed medication, Niferex 150 mg capsule daily . Collaborated with embedded PharmD [name] regarding further evaluation is needed to assess patient's treatment regimen for Anemia is appropriate and or if additional pharmacological recommendations are needed by Hematology . Discussed plans with patient for ongoing care management follow up and provided patient with direct contact information for care management team . Plan to send printed patient educational materials related to Anemia following next contact  Patient Self Care Activities:  . Self administers medications as prescribed . Attends all scheduled provider appointments . Calls pharmacy for medication refills . Attends church or other social activities . Performs ADL's independently . Performs IADL's independently . Calls provider office for new concerns or questions  Initial goal documentation     . "I need help getting a meter" (pt-stated)       Current Barriers:  . Lacks knowledge of process to obtain diabetic testing supplies   Clinical Social Work Clinical Goal(s):  [name] Over the next two weeks the patient will work with embedded PharmD as directed by CCM SW to address diabetic supply needs.  CC RN CM Interventions: 04/22/19 call completed with patient   . Discussed patient currently has a One Touch zeroflex glucometer that she reports is over a year old and she currently out of glucose strips - therefore she is not checking her blood sugars  . Discussed patient would prefer a new Rx sent to her pharmacy - she plans to start Self monitoring her BS once the new glucometer is received . Forwarded in [employer] message to provider Minette Brine, FNP requesting a new Rx be sent to patient's pharmacy  Patient Self Care Activities:  . Currently UNABLE TO independently navigate benefits to obtain testing supplies  Please see past updates related to this goal by clicking on the "Past Updates" button in the selected goal      . "I would like to better manage my diabetes" (pt-stated)       Current Barriers:  [name] Knowledge Deficits related to diabetes disease process and Self  health management  . Non-adherence to Self monitoring CBG's   Nurse Case Manager Clinical Goal(s):  [name] Over the next 30 days, patient will work with the [facility] team to address needs related to diabetes Self Health management to help lower her A1C . Over the next 60 days, patient will demonstrate improved adherence to prescribed treatment plan for DMII as evidenced bypatient will verbalize adherence to Self monitoring her CBG daily, taking her diabetic medications exactly as prescribed w/o missed doses and adhering to her ADA diet as recommended . Over the next 90 days, patient will maintain or lower her current A1C of 8.4  CC RN CM Interventions:  04/22/19 call completed with patient   . Evaluation of current treatment plan related to Diabetes and patient's adherence to plan as established by provider. . Provided education to patient re: current A1C of 8.4;  discussed target A1C of <7.0; educated patient on basic disease process of diabetes; educated on potential complications of uncontrolled diabetes including risk for diabetic retinopathy, diabetic neuropathy, heart disease, renal disease/failure, poor wound healing that can lead to amputation . Reviewed medications with patient and discussed patients prescribed diabetic regimen, Metformin 1000 mg bid meals; Ozempic 2 mg/1.5 mL, take 0.5 mg weekly injection . Collaborated with provider Minette Brine, FNP regarding the need for replacement of patient's glucometer . Discussed plans with patient for ongoing care management follow up and provided patient with direct contact information for care management team . Provided patient with printed educational materials related to "The Diabetes Diet", "Low Carb Snacks", "Manage Your Diabetes", "The Food Plate", "signs/symptoms Hypo/Hyperglycemia" . Advised patient, providing education and rationale, to check cbg before meals and record, calling the CCM team and or MD for findings outside established parameters.  <80 or >250  Patient Self Care Activities:  . Self administers medications as prescribed . Attends all scheduled provider appointments . Calls pharmacy for medication refills . Attends church or other social activities . Performs ADL's independently . Performs IADL's independently . Calls provider office for new concerns or questions  Please see past updates related to this goal by clicking on the "Past Updates" button in the selected goal        Other   . COMPLETED: Assist with Chronic Care Management and Care Coordination needs       Current Barriers:  [name] Knowledge Barriers related to resources and support available to address needs related to Chronic Care Management and Community Resources   Case Manager Clinical Goal(s):  [name] Over the next 30 days, patient will work with the CCM team to address needs related to Chronic disease management and  Care Coordination  CC RN CM Interventions:  04/22/19 call completed with patient  . Initial CC RN CM outreach completed with patient . See other goals established with patient for specific comorbidity's  Patient Self Care Activities:  . Attends all scheduled provider appointments . Calls provider office for new concerns or questions  Please see past updates related to this goal by clicking on the "Past Updates" button in the selected goal         Plan:   Telephone follow up appointment with care management team member scheduled for: 05/18/19  Barb Merino, RN, BSN, CCM Care Management Coordinator [facility]  Direct Phone: [phone]

## 2019-04-27 ENCOUNTER — Other Ambulatory Visit: Payer: Self-pay

## 2019-04-27 ENCOUNTER — Ambulatory Visit: Payer: Self-pay

## 2019-04-27 DIAGNOSIS — I10 Essential (primary) hypertension: Secondary | ICD-10-CM

## 2019-04-27 DIAGNOSIS — E1165 Type 2 diabetes mellitus with hyperglycemia: Secondary | ICD-10-CM

## 2019-04-27 MED ORDER — ENTRESTO 49-51 MG PO TABS: 1.0000 | ORAL_TABLET | Freq: Two times a day (BID) | ORAL | 1 refills | Status: DC

## 2019-04-27 NOTE — Patient Instructions (Signed)
Social Worker Visit Information  Goals we discussed today:  Goals Addressed            This Visit's Progress     Patient Stated   . "I am out of my blood pressure medicine and really need it" (pt-stated)       Current Barriers:  . Limited understanding of process to obtain prescription refills  Clinical Social Work Clinical Goal(s):  [name] Over the next 3 days the patient will obtain medications from her neighborhood pharmacy  CCM SW Interventions: Completed 04/27/2019 . Patient interviewed and appropriate assessments performed the patient reports being out of her BP medication Entresto and is currently experiencing headache with "right eye jumping" . Informed by the patient her pharmacy informed her of being out of refills with intention on contacting the patients provider office . Collaboration with the patients provider regarding medication refill need and reported symptoms via secure chat . Collaboration with CCM RN Case Manager regarding reported symptoms via secure chat . Advised the patient that orders would be sent to the pharmacy of her choice ([facility] on [address]) . Requested the patient contact the provider office if the pharmacy does not have orders by 2:00 pm this afternoon . Inbound call from the patient who reports she is currently at [facility] and reports needing prior authorization for medication to be filled . Collaboration with provider office who reports they will contact the patient to schedule a BP check appointment and work on prior authorization . Advised the patient to expect a call from her provider for plan  Patient Self Care Activities:  . Currently UNABLE TO independently manage medications related to chronic conditions  Initial goal documentation     . COMPLETED: "I need help getting a meter" (pt-stated)   Not on track    Current Barriers:  . Lacks knowledge of process to obtain diabetic testing supplies  Clinical Social Work Clinical Goal(s):   [name] Over the next two weeks the patient will work with embedded PharmD as directed by CCM SW to address diabetic supply needs.  CC RN CM Interventions: 04/22/19 call completed with patient   . Discussed patient currently has a One Touch zeroflex glucometer that she reports is over a year old and she currently out of glucose strips - therefore she is not checking her blood sugars  . Discussed patient would prefer a new Rx sent to her pharmacy - she plans to start Self monitoring her BS once the new glucometer is received . Forwarded in basket message to provider Minette Brine, FNP requesting a new Rx be sent to patient's pharmacy  CCM SW Interventions Completed: 04/27/2019 . Outbound call to assess progression of patient stated goals . Determined the patient has yet to receive diabetic supplies stating the pharmacy needs a prescription . Collaboration with the patient provider via secure chat to request orders be sent to the pharmacy . Advised the patient that if she does not hear from the pharmacy today regarding prescription being filled to outreach the provider office . Inbound call from the patient who reports picking lancets up from the pharmacy   Patient Self Care Activities:  . Currently UNABLE TO independently navigate benefits to obtain testing supplies  Please see past updates related to this goal by clicking on the "Past Updates" button in the selected goal      . "I want to weigh less than 300 pounds by the end of the year" (pt-stated)   Not on track  Current Barriers:  . Lacks knowledge of safe diet and exercise conducive to weight loss that accomodates health conditions . Increased stress . COVID 19 causing changes to current routines with children  Clinical Social Work Clinical Goal(s):  [name] Over the next 90 days the patient will work with CCM team to address weight loss goal  CCM SW Interventions: Completed 04/27/2019 . Follow up call placed to the patient to confirm  receipt of EMMI educational tools . Informed by the patient she has received the information but has been extremely stressed with virtual school she has not been able to read through the provided materials . Encouraged the patient to practice self care during this stressful time . Scheduled follow up call to the patient over the next month to assess patient progress  Patient Self Care Activities:  . Attends all scheduled provider appointments . Performs ADL's independently . Calls provider office for new concerns or questions  Please see past updates related to this goal by clicking on the "Past Updates" button in the selected goal          Follow Up Plan: SW will follow up with patient by phone over the next month   [name], BSW, CDP Social Worker, Certified Dementia Practitioner [facility] [phone]

## 2019-04-27 NOTE — Chronic Care Management (AMB) (Signed)
Care Management    Social Work Follow Up Note  04/27/2019 Name: [name] MRN: [id number] DOB: 1981/06/15  [name] is a 38 y.o. year old female who is a primary care patient of [name], MD. The CCM team was consulted for assistance with [employer] .   Review of patient status, including review of consultants reports, other relevant assessments, and collaboration with appropriate care team members and the patient's provider was performed as part of comprehensive patient evaluation and provision of chronic care management services.      Outpatient Encounter Medications as of 04/27/2019  Medication Sig  . albuterol (PROVENTIL HFA;VENTOLIN HFA) 108 (90 BASE) MCG/ACT inhaler Inhale 2-3 puffs into the lungs every 6 (six) hours as needed for wheezing. Asthma/SOB  . albuterol (PROVENTIL) (5 MG/ML) 0.5% nebulizer solution Take 0.5 mLs (2.5 mg total) by nebulization every 6 (six) hours as needed for wheezing or shortness of breath.  . ALPRAZolam (XANAX) 0.5 MG tablet Take 1 tablet (0.5 mg total) by mouth 2 (two) times daily as needed for anxiety or sleep.  . cetirizine (ZYRTEC ALLERGY) 10 MG tablet Take 1 tablet (10 mg total) by mouth daily.  . cyclobenzaprine (FLEXERIL) 10 MG tablet Take 1 tablet by mouth before bed as needed for muscle spasm. Warning: May cause drowsiness. (Patient not taking: Reported on 03/29/2019)  . diclofenac (VOLTAREN) 75 MG EC tablet Take 1 tablet (75 mg total) by mouth 2 (two) times daily. (Patient not taking: Reported on 03/29/2019)  . EPINEPHrine (EPIPEN 2-PAK) 0.3 mg/0.3 mL IJ SOAJ injection Inject 0.3 mLs (0.3 mg total) into the muscle once as needed (for severe allergic reaction). CAll 911 immediately if you have to use this medicine  . ferrous sulfate 325 (65 FE) MG EC tablet Take 1 tablet (325 mg total) by mouth 3 (three) times daily with meals.  . iron polysaccharides (NIFEREX) 150 MG capsule Take 1 capsule (150 mg total) by mouth daily.  .  meloxicam (MOBIC) 15 MG tablet Take 1 tablet (15 mg total) by mouth daily. (Patient not taking: Reported on 03/29/2019)  . metFORMIN (GLUCOPHAGE) 1000 MG tablet Take 1 tablet (1,000 mg total) by mouth 2 (two) times daily with a meal.  . OVER THE COUNTER MEDICATION Place 2 drops into both eyes daily. Reported on 08/27/2015  . pantoprazole (PROTONIX) 20 MG tablet Take 1 tablet (20 mg total) by mouth 2 (two) times daily.  . Semaglutide,0.25 or 0.5MG /DOS, (OZEMPIC, 0.25 OR 0.5 MG/DOSE,) 2 MG/1.5ML SOPN Inject 0.5 mg into the skin once a week.   No facility-administered encounter medications on file as of 04/27/2019.      Goals Addressed            This Visit's Progress     Patient Stated   . "I am out of my blood pressure medicine and really need it" (pt-stated)       Current Barriers:  . Limited understanding of process to obtain prescription refills  Clinical Social Work Clinical Goal(s):  [name] Over the next 3 days the patient will obtain medications from her neighborhood pharmacy  CCM SW Interventions: Completed 04/27/2019 . Patient interviewed and appropriate assessments performed the patient reports being out of her BP medication Entresto and is currently experiencing headache with "right eye jumping" . Informed by the patient her pharmacy informed her of being out of refills with intention on contacting the patients provider office . Collaboration with the patients provider regarding medication refill need and reported symptoms via  secure chat . Collaboration with CCM RN Case Manager regarding reported symptoms via secure chat . Advised the patient that orders would be sent to the pharmacy of her choice ([facility] on [address]) . Requested the patient contact the provider office if the pharmacy does not have orders by 2:00 pm this afternoon . Inbound call from the patient who reports she is currently at [facility] and reports needing prior authorization for medication to be filled .  Collaboration with provider office who reports they will contact the patient to schedule a BP check appointment and work on prior authorization . Advised the patient to expect a call from her provider for plan  Patient Self Care Activities:  . Currently UNABLE TO independently manage medications related to chronic conditions  Initial goal documentation     . COMPLETED: "I need help getting a meter" (pt-stated)   Not on track    Current Barriers:  . Lacks knowledge of process to obtain diabetic testing supplies  Clinical Social Work Clinical Goal(s):  [name] Over the next two weeks the patient will work with embedded PharmD as directed by CCM SW to address diabetic supply needs.  CCM SW Interventions Completed: 04/27/2019 . Outbound call to assess progression of patient stated goals . Determined the patient has yet to receive diabetic supplies stating the pharmacy needs a prescription . Collaboration with the patient provider via secure chat to request orders be sent to the pharmacy . Advised the patient that if she does not hear from the pharmacy today regarding prescription being filled to outreach the provider office Inbound call from the patient who reports picking lancets up from the pharmacy   Patient Self Care Activities:  . Currently UNABLE TO independently navigate benefits to obtain testing supplies  Please see past updates related to this goal by clicking on the "Past Updates" button in the selected goal      . "I want to weigh less than 300 pounds by the end of the year" (pt-stated)   Not on track    Current Barriers:  . Lacks knowledge of safe diet and exercise conducive to weight loss that accomodates health conditions . Increased stress . COVID 19 causing changes to current routines with children  Clinical Social Work Clinical Goal(s):  [name] Over the next 90 days the patient will work with CCM team to address weight loss goal  CCM SW Interventions: Completed 04/27/2019 .  Follow up call placed to the patient to confirm receipt of EMMI educational tools . Informed by the patient she has received the information but has been extremely stressed with virtual school she has not been able to read through the provided materials . Encouraged the patient to practice self care during this stressful time . Scheduled follow up call to the patient over the next month to assess patient progress  Patient Self Care Activities:  . Attends all scheduled provider appointments . Performs ADL's independently . Calls provider office for new concerns or questions  Please see past updates related to this goal by clicking on the "Past Updates" button in the selected goal          Follow Up Plan: SW will follow up with patient by phone over the next month   [name], BSW, CDP Social Worker, Certified Dementia Practitioner [place] [facility] [place] [facility] [phone]

## 2019-04-28 ENCOUNTER — Other Ambulatory Visit: Payer: Self-pay

## 2019-04-28 ENCOUNTER — Encounter: Payer: Self-pay | Admitting: Internal Medicine

## 2019-04-28 ENCOUNTER — Ambulatory Visit (INDEPENDENT_AMBULATORY_CARE_PROVIDER_SITE_OTHER): Payer: Medicaid Other | Admitting: Internal Medicine

## 2019-04-28 VITALS — BP 156/92 | HR 84 | Temp 98.7°F

## 2019-04-28 DIAGNOSIS — I10 Essential (primary) hypertension: Secondary | ICD-10-CM | POA: Diagnosis not present

## 2019-04-28 DIAGNOSIS — Z9114 Patient's other noncompliance with medication regimen: Secondary | ICD-10-CM

## 2019-04-28 NOTE — Progress Notes (Signed)
Subjective:     Patient ID: [name] , female    DOB: 10-26-1980 , 38 y.o.   MRN: [id number]   Chief Complaint  Patient presents with  . Hypertension    b/p check    HPI HA x 1 week with vision changes. HA located on her forehead, feels like pressure. Feels like is her BP. Has not been taking Entresto since her Medicaid will not cover it.  " I've been seeing spots" Admits being very stressed as well. Last eye exam less than 1y. Wears contacts. Has not been checking her BP since she does not have a cuff and has not stopped anywhere to get it done.    Past Medical History:  Diagnosis Date  . Abnormal Pap smear    f/u wnl  . Asthma   . Fibroid   . Gestational diabetes   . Hypertension   . Morbid obesity ([place])   . Sleep apnea   . Urinary tract infection      Family History  Problem Relation Age of Onset  . Diabetes Father   . Allergies Mother   . Asthma Mother   . Hypertension Mother   . Diabetes Mother   . Diabetes Maternal Grandmother   . Heart disease Maternal Grandmother   . Anesthesia problems Neg Hx   . Hypotension Neg Hx   . Malignant hyperthermia Neg Hx   . Pseudochol deficiency Neg Hx   . Hearing loss Neg Hx      Current Outpatient Medications:  .  albuterol (PROVENTIL HFA;VENTOLIN HFA) 108 (90 BASE) MCG/ACT inhaler, Inhale 2-3 puffs into the lungs every 6 (six) hours as needed for wheezing. Asthma/SOB, Disp: , Rfl:  .  albuterol (PROVENTIL) (5 MG/ML) 0.5% nebulizer solution, Take 0.5 mLs (2.5 mg total) by nebulization every 6 (six) hours as needed for wheezing or shortness of breath., Disp: 20 mL, Rfl: 12 .  ALPRAZolam (XANAX) 0.5 MG tablet, Take 1 tablet (0.5 mg total) by mouth 2 (two) times daily as needed for anxiety or sleep., Disp: 30 tablet, Rfl: 0 .  cetirizine (ZYRTEC ALLERGY) 10 MG tablet, Take 1 tablet (10 mg total) by mouth daily., Disp: 90 tablet, Rfl: 0 .  cyclobenzaprine (FLEXERIL) 10 MG tablet, Take 1 tablet by mouth before bed as needed  for muscle spasm. Warning: May cause drowsiness., Disp: 10 tablet, Rfl: 0 .  diclofenac (VOLTAREN) 75 MG EC tablet, Take 1 tablet (75 mg total) by mouth 2 (two) times daily., Disp: 14 tablet, Rfl: 0 .  EPINEPHrine (EPIPEN 2-PAK) 0.3 mg/0.3 mL IJ SOAJ injection, Inject 0.3 mLs (0.3 mg total) into the muscle once as needed (for severe allergic reaction). CAll 911 immediately if you have to use this medicine, Disp: 2 Device, Rfl: 1 .  ferrous sulfate 325 (65 FE) MG EC tablet, Take 1 tablet (325 mg total) by mouth 3 (three) times daily with meals., Disp: 90 tablet, Rfl: 2 .  iron polysaccharides (NIFEREX) 150 MG capsule, Take 1 capsule (150 mg total) by mouth daily., Disp: 90 capsule, Rfl: 2 .  meloxicam (MOBIC) 15 MG tablet, Take 1 tablet (15 mg total) by mouth daily., Disp: 30 tablet, Rfl: 0 .  metFORMIN (GLUCOPHAGE) 1000 MG tablet, Take 1 tablet (1,000 mg total) by mouth 2 (two) times daily with a meal., Disp: 180 tablet, Rfl: 1 .  OVER THE COUNTER MEDICATION, Place 2 drops into both eyes daily. Reported on 08/27/2015, Disp: , Rfl:  .  sacubitril-valsartan (ENTRESTO) 49-51 MG,  Take 1 tablet by mouth 2 (two) times daily., Disp: 180 tablet, Rfl: 1 .  Semaglutide,0.25 or 0.5MG /DOS, (OZEMPIC, 0.25 OR 0.5 MG/DOSE,) 2 MG/1.5ML SOPN, Inject 0.5 mg into the skin once a week., Disp: 1 pen, Rfl: 3 .  pantoprazole (PROTONIX) 20 MG tablet, Take 1 tablet (20 mg total) by mouth 2 (two) times daily., Disp: 32 tablet, Rfl: 0   Allergies  Allergen Reactions  . Other Anaphylaxis and Other (See Comments)    Pt has fish allergy Pt has allergy to butter cookies  . Penicillins Rash and Other (See Comments)    Told MD that PCN reaction is rash and that she has taken amoxicillin before     Review of Systems  Constitutional: Negative for diaphoresis and unexpected weight change.  HENT: Negative for tinnitus.   Eyes: + for visual disturbance.  Respiratory: Negative for chest tightness and shortness of breath.    Cardiovascular: Negative for chest pain, palpitations and leg swelling.  Gastrointestinal: Negative for constipation, diarrhea and nausea.  Genitourinary: Negative for dysuria and frequency.  Skin: Negative for rash and wound.  Neurological: Negative for dizziness, speech difficulty, weakness, numbness. Positive for headaches.    Today's Vitals   04/28/19 1221  BP: (!) 156/92  Pulse: 84  Temp: 98.7 F (37.1 C)  TempSrc: Oral   There is no height or weight on file to calculate BMI.   Objective:  Physical Exam   Constitutional: She is oriented to person, place, and time. She appears well-developed and well-nourished. No distress.  HENT:  Head: Normocephalic and atraumatic.  Right Ear: External ear normal.  Left Ear: External ear normal.  Nose: Nose normal.  Eyes: Conjunctivae are normal. Right eye exhibits no discharge. Left eye exhibits no discharge. No scleral icterus.  Neck: Neck supple. No thyromegaly present.  No carotid bruits bilaterally  Cardiovascular: Normal rate and regular rhythm.  No murmur heard. Pulmonary/Chest: Effort normal and breath sounds normal. No respiratory distress.  Musculoskeletal: Normal range of motion. She exhibits no edema.  Lymphadenopathy:    She has no cervical adenopathy.  Neurological: She is alert and oriented to person, place, and time.  Skin: Skin is warm and dry. Capillary refill takes less than 2 seconds. No rash noted. She is not diaphoretic.  Psychiatric: She has a normal mood and affect. Her behavior is normal. Judgment and thought content normal.  Nursing note reviewed.    Assessment And Plan:  1. Essential hypertension- uncontrolled    I placed her on Lisinopril 10 mg qd.    Fu in 2 weeks with Janece. Advised to try to do BP diaries and bring them with her.   Shatonia Hoots RODRIGUEZ-SOUTHWORTH, [name]    THE PATIENT IS ENCOURAGED TO PRACTICE SOCIAL DISTANCING DUE TO THE COVID-19 PANDEMIC.

## 2019-04-29 ENCOUNTER — Ambulatory Visit: Payer: Medicaid Other | Admitting: Podiatry

## 2019-04-29 ENCOUNTER — Telehealth: Payer: Self-pay

## 2019-04-29 ENCOUNTER — Encounter: Payer: Self-pay | Admitting: Podiatry

## 2019-04-29 ENCOUNTER — Other Ambulatory Visit: Payer: Self-pay

## 2019-04-29 DIAGNOSIS — Z794 Long term (current) use of insulin: Secondary | ICD-10-CM | POA: Diagnosis not present

## 2019-04-29 DIAGNOSIS — G8929 Other chronic pain: Secondary | ICD-10-CM

## 2019-04-29 DIAGNOSIS — L84 Corns and callosities: Secondary | ICD-10-CM

## 2019-04-29 DIAGNOSIS — M79671 Pain in right foot: Secondary | ICD-10-CM

## 2019-04-29 DIAGNOSIS — E119 Type 2 diabetes mellitus without complications: Secondary | ICD-10-CM

## 2019-04-29 NOTE — Progress Notes (Signed)
This patient presents to the office with painful callus on both big toe.  She says these callus are painful walking and wearing her shoes.  She says she goes to the salon for callus care but the problem persists.  She presents to the office for evaluation and treatment.  Patient is diabetic.  Vascular  Dorsalis pedis and posterior tibial pulses are palpable  B/L.  Capillary return  WNL.  Temperature gradient is  WNL.  Skin turgor  WNL  Sensorium  Senn Weinstein monofilament wire  WNL. Normal tactile sensation.  Nail Exam  Patient has normal nails with no evidence of bacterial or fungal infection.  Orthopedic  Exam  Muscle tone and muscle strength  WNL.  No limitations of motion feet  B/L.  No crepitus or joint effusion noted.  Foot type is unremarkable and digits show no abnormalities.  Bony prominences are unremarkable.  Skin  No open lesions.  Normal skin texture and turgor.  Pinch callus hallux  B/l.   Pinch callus  B/L.  ROV.  Debride callus hallux with # 15 blade.  RTC prn   Gardiner Barefoot DPM

## 2019-05-03 ENCOUNTER — Telehealth: Payer: Self-pay

## 2019-05-04 ENCOUNTER — Telehealth: Payer: Self-pay

## 2019-05-05 ENCOUNTER — Telehealth: Payer: Self-pay

## 2019-05-05 ENCOUNTER — Other Ambulatory Visit: Payer: Self-pay

## 2019-05-05 ENCOUNTER — Other Ambulatory Visit: Payer: Self-pay | Admitting: Internal Medicine

## 2019-05-05 ENCOUNTER — Ambulatory Visit: Payer: Self-pay | Admitting: Pharmacist

## 2019-05-05 DIAGNOSIS — E1165 Type 2 diabetes mellitus with hyperglycemia: Secondary | ICD-10-CM

## 2019-05-05 DIAGNOSIS — I10 Essential (primary) hypertension: Secondary | ICD-10-CM

## 2019-05-05 MED ORDER — ACCU-CHEK MULTICLIX LANCETS MISC: 12 refills | Status: DC

## 2019-05-05 MED ORDER — ACCU-CHEK AVIVA DEVI: 0 refills | Status: AC

## 2019-05-05 MED ORDER — ACCU-CHEK AVIVA DEVI: 0 refills | Status: DC

## 2019-05-05 MED ORDER — GLUCOSE BLOOD VI STRP: ORAL_STRIP | 12 refills | Status: DC

## 2019-05-05 MED ORDER — LISINOPRIL 10 MG PO TABS: 10.0000 mg | ORAL_TABLET | Freq: Every day | ORAL | 0 refills | Status: DC

## 2019-05-05 NOTE — Progress Notes (Unsigned)
Hi! patient had BP visit on 04/28/19. unable to get entresto due to insurance and lisinopril wasn't ever called in (that i could find). Can you call in [facility]ril [facility] to [facility] [address] (per chart)? pt complaining on high BP/headache. Also needs aviva accucheck meter and supplies called in. Thank you!   I sent the lisinopril. Doroteo Bradford, will you please sent the accuchek meter?

## 2019-05-05 NOTE — Telephone Encounter (Signed)
Called pt to let her know that lisinopril and a accuchek meter has been sent into the pharmacy for her Per Sunday Spillers:  Hi! patient had BP visit on 04/28/19. unable to get entresto due to insurance and lisinopril wasn't ever called in (that i could find). Can you call in [facility]opril for [facility] to [facility] [address] (per chart)? pt complaining on high BP/headache. Also needs aviva accucheck meter and supplies called in. Thank you!   I sent the lisinopril. Doroteo Bradford, will you please sent the accuchek meter?

## 2019-05-07 NOTE — Progress Notes (Signed)
Chronic Care Management   Initial Visit Note  05/05/2019 Name: [name] MRN: [id number] DOB: 1980/11/06  Referred by: Stephanie Brine, FNP Reason for referral : Chronic Care Management   [name] is a 38 y.o. year old female who is a primary care patient of [name], [place]. The CCM team was consulted for assistance with chronic disease management and care coordination needs.   Review of patient status, including review of consultants reports, relevant laboratory and other test results, and collaboration with appropriate care team members and the patient's provider was performed as part of comprehensive patient evaluation and provision of chronic care management services.    I spoke with [name] by telephone today.  Advanced Directives Status: N See Care Plan and [employer] application for related entries.   Medications: Outpatient Encounter Medications as of 05/05/2019  Medication Sig  . albuterol (PROVENTIL HFA;VENTOLIN HFA) 108 (90 BASE) MCG/ACT inhaler Inhale 2-3 puffs into the lungs every 6 (six) hours as needed for wheezing. Asthma/SOB  . albuterol (PROVENTIL) (5 MG/ML) 0.5% nebulizer solution Take 0.5 mLs (2.5 mg total) by nebulization every 6 (six) hours as needed for wheezing or shortness of breath.  . ALPRAZolam (XANAX) 0.5 MG tablet Take 1 tablet (0.5 mg total) by mouth 2 (two) times daily as needed for anxiety or sleep.  . Blood Glucose Monitoring Suppl (ACCU-CHEK AVIVA) device USE TO CHECK BLOOD SUGARS UP TO 4 TIMES DAILY  DX CODE:E11.9  . cetirizine (ZYRTEC ALLERGY) 10 MG tablet Take 1 tablet (10 mg total) by mouth daily.  . cyclobenzaprine (FLEXERIL) 10 MG tablet Take 1 tablet by mouth before bed as needed for muscle spasm. Warning: May cause drowsiness.  [name] EPINEPHrine (EPIPEN 2-PAK) 0.3 mg/0.3 mL IJ SOAJ injection Inject 0.3 mLs (0.3 mg total) into the muscle once as needed (for severe allergic reaction). CAll 911 immediately if you have to use this medicine  .  ferrous sulfate 325 (65 FE) MG EC tablet Take 1 tablet (325 mg total) by mouth 3 (three) times daily with meals.  [name] glucose blood test strip USE TO CHECK BLOOD SUGARS UP TO 4 TIMES DAILY  DX CODE:E11.9  . iron polysaccharides (NIFEREX) 150 MG capsule Take 1 capsule (150 mg total) by mouth daily.  . Lancets (ACCU-CHEK MULTICLIX) lancets USE TO CHECK BLOOD SUGARS UP TO 4 TIMES DAILY  DX CODE:E11.9  . lisinopril (PRINIVIL) 10 MG tablet Take 1 tablet (10 mg total) by mouth daily. Needs office visit before she rans out, for BP check  . metFORMIN (GLUCOPHAGE) 1000 MG tablet Take 1 tablet (1,000 mg total) by mouth 2 (two) times daily with a meal.  . OVER THE COUNTER MEDICATION Place 2 drops into both eyes daily. Reported on 08/27/2015  . pantoprazole (PROTONIX) 20 MG tablet Take 1 tablet (20 mg total) by mouth 2 (two) times daily.  . Semaglutide,0.25 or 0.5MG /DOS, (OZEMPIC, 0.25 OR 0.5 MG/DOSE,) 2 MG/1.5ML SOPN Inject 0.5 mg into the skin once a week.   No facility-administered encounter medications on file as of 05/05/2019.      Objective:   Goals Addressed            This Visit's Progress     Patient Stated   . "I am out of my blood pressure medicine and really need it" (pt-stated)       Current Barriers:  . Limited understanding of process to obtain prescription refills  Clinical Social Work Clinical Goal(s):  [name] Over the next 3 days  the patient will obtain medications from her neighborhood pharmacy  CCM SW Interventions: Completed 04/27/2019 . Patient interviewed and appropriate assessments performed the patient reports being out of her BP medication Entresto and is currently experiencing headache with "right eye jumping" . Informed by the patient her pharmacy informed her of being out of refills with intention on contacting the patients provider office . Collaboration with the patients provider regarding medication refill need and reported symptoms via secure chat . Collaboration with  CCM RN Case Manager regarding reported symptoms via secure chat . Advised the patient that orders would be sent to the pharmacy of her choice ([facility] on [address]) . Requested the patient contact the provider office if the pharmacy does not have orders by 2:00 pm this afternoon . Inbound call from the patient who reports she is currently at [facility] and reports needing prior authorization for medication to be filled . Collaboration with provider office who reports they will contact the patient to schedule a BP check appointment and work on prior authorization . Advised the patient to expect a call from her provider for plan  CCM PharmD Interventions: Completed 05/05/2019 . Audery Amel, [name] called in lisinopril for BP management.  Patient has taken in the past and tolerated well.  Also, called in glucometer per patient request. . Encouraged patient to check blood pressure often . PCP followup next week  Patient Self Care Activities:  . Currently UNABLE TO independently manage medications related to chronic conditions  Initial goal documentation        Plan:   The care management team will reach out to the patient again over the next 8 weeks.  [name], PharmD, [place]  [phone]

## 2019-05-12 ENCOUNTER — Ambulatory Visit
Admission: RE | Admit: 2019-05-12 | Discharge: 2019-05-12 | Disposition: A | Discharge status: Home / Self Care | Payer: Medicaid Other | Source: Ambulatory Visit | Attending: Internal Medicine | Admitting: Internal Medicine

## 2019-05-12 ENCOUNTER — Other Ambulatory Visit: Payer: Self-pay

## 2019-05-12 ENCOUNTER — Ambulatory Visit (INDEPENDENT_AMBULATORY_CARE_PROVIDER_SITE_OTHER): Payer: Medicaid Other | Admitting: Internal Medicine

## 2019-05-12 ENCOUNTER — Encounter: Payer: Self-pay | Admitting: Internal Medicine

## 2019-05-12 VITALS — BP 150/102 | HR 107 | Temp 98.5°F | Ht 63.4 in | Wt 336.8 lb

## 2019-05-12 DIAGNOSIS — M79661 Pain in right lower leg: Secondary | ICD-10-CM | POA: Diagnosis not present

## 2019-05-12 DIAGNOSIS — I10 Essential (primary) hypertension: Secondary | ICD-10-CM

## 2019-05-12 DIAGNOSIS — S8991XA Unspecified injury of right lower leg, initial encounter: Secondary | ICD-10-CM | POA: Diagnosis not present

## 2019-05-12 DIAGNOSIS — E782 Mixed hyperlipidemia: Secondary | ICD-10-CM

## 2019-05-12 DIAGNOSIS — W133XXA Fall through floor, initial encounter: Secondary | ICD-10-CM | POA: Diagnosis not present

## 2019-05-12 DIAGNOSIS — Z9119 Patient's noncompliance with other medical treatment and regimen: Secondary | ICD-10-CM

## 2019-05-12 DIAGNOSIS — S8011XA Contusion of right lower leg, initial encounter: Secondary | ICD-10-CM

## 2019-05-12 DIAGNOSIS — E119 Type 2 diabetes mellitus without complications: Secondary | ICD-10-CM | POA: Diagnosis not present

## 2019-05-12 DIAGNOSIS — Z23 Encounter for immunization: Secondary | ICD-10-CM

## 2019-05-12 DIAGNOSIS — Z794 Long term (current) use of insulin: Secondary | ICD-10-CM | POA: Diagnosis not present

## 2019-05-12 MED ORDER — TELMISARTAN 40 MG PO TABS: 40.0000 mg | ORAL_TABLET | Freq: Every day | ORAL | 0 refills | Status: DC

## 2019-05-12 MED ORDER — EPINEPHRINE 0.3 MG/0.3ML IJ SOAJ: 0.3000 mg | Freq: Once | INTRAMUSCULAR | 1 refills | Status: DC | PRN

## 2019-05-12 NOTE — Patient Instructions (Signed)
ICE YOUR LEG FOR 15 MINUTES 2-4 TIMES FOR 48H FROM THE DAY OF THE INJURY. YOU MAY TAKE TYLENOL AS NEEDED FOR PAIN

## 2019-05-12 NOTE — Progress Notes (Signed)
Subjective:     Patient ID: [name] , female    DOB: 03-28-1981 , 38 y.o.   MRN: [id number]   Chief Complaint  Patient presents with  . Hypertension  . Fall    injury to right leg    HPI  1- HTN FU - she has not checked her BP and takes her Lisinopril  qhs because she does not want to get dizzy, which has happened in the past.  She did  not miss it last night. She took Entresto for 2 months early this year, then when she went back to pick up a refill the pharmacist told her that needed to be preauthorized.   2- DM FU- she is awaiting on authorization for the glucometer. She denies polyuria or polyphagia.   3- R leg pain from a fall  Due to her R leg went through the floor  And injured R med shin. She is having trouble barring wt. Pain is described as aching-throbbing. Worse to walk on it and press on it. Denies paresthesia.   Past Medical History:  Diagnosis Date  . Abnormal Pap smear    f/u wnl  . Asthma   . Fibroid   . Gestational diabetes   . Hypertension   . Morbid obesity ([place])   . Sleep apnea   . Urinary tract infection      Family History  Problem Relation Age of Onset  . Diabetes Father   . Allergies Mother   . Asthma Mother   . Hypertension Mother   . Diabetes Mother   . Diabetes Maternal Grandmother   . Heart disease Maternal Grandmother   . Anesthesia problems Neg Hx   . Hypotension Neg Hx   . Malignant hyperthermia Neg Hx   . Pseudochol deficiency Neg Hx   . Hearing loss Neg Hx      Current Outpatient Medications:  .  albuterol (PROVENTIL HFA;VENTOLIN HFA) 108 (90 BASE) MCG/ACT inhaler, Inhale 2-3 puffs into the lungs every 6 (six) hours as needed for wheezing. Asthma/SOB, Disp: , Rfl:  .  albuterol (PROVENTIL) (5 MG/ML) 0.5% nebulizer solution, Take 0.5 mLs (2.5 mg total) by nebulization every 6 (six) hours as needed for wheezing or shortness of breath., Disp: 20 mL, Rfl: 12 .  ALPRAZolam (XANAX) 0.5 MG tablet, Take 1 tablet (0.5 mg total) by  mouth 2 (two) times daily as needed for anxiety or sleep., Disp: 30 tablet, Rfl: 0 .  cetirizine (ZYRTEC ALLERGY) 10 MG tablet, Take 1 tablet (10 mg total) by mouth daily., Disp: 90 tablet, Rfl: 0 .  EPINEPHrine (EPIPEN 2-PAK) 0.3 mg/0.3 mL IJ SOAJ injection, Inject 0.3 mLs (0.3 mg total) into the muscle once as needed (for severe allergic reaction). CAll 911 immediately if you have to use this medicine, Disp: 2 Device, Rfl: 1 .  glucose blood test strip, USE TO CHECK BLOOD SUGARS UP TO 4 TIMES DAILY  DX CODE:E11.9, Disp: 100 each, Rfl: 12 .  iron polysaccharides (NIFEREX) 150 MG capsule, Take 1 capsule (150 mg total) by mouth daily., Disp: 90 capsule, Rfl: 2 .  Lancets (ACCU-CHEK MULTICLIX) lancets, USE TO CHECK BLOOD SUGARS UP TO 4 TIMES DAILY  DX CODE:E11.9, Disp: 100 each, Rfl: 12 .  lisinopril (PRINIVIL) 10 MG tablet, Take 1 tablet (10 mg total) by mouth daily. Needs office visit before she rans out, for BP check, Disp: 30 tablet, Rfl: 0 .  metFORMIN (GLUCOPHAGE) 1000 MG tablet, Take 1 tablet (1,000 mg total) by  mouth 2 (two) times daily with a meal., Disp: 180 tablet, Rfl: 1 .  Semaglutide,0.25 or 0.5MG /DOS, (OZEMPIC, 0.25 OR 0.5 MG/DOSE,) 2 MG/1.5ML SOPN, Inject 0.5 mg into the skin once a week., Disp: 1 pen, Rfl: 3 .  Blood Glucose Monitoring Suppl (ACCU-CHEK AVIVA) device, USE TO CHECK BLOOD SUGARS UP TO 4 TIMES DAILY  DX CODE:E11.9, Disp: 1 each, Rfl: 0 .  ferrous sulfate 325 (65 FE) MG EC tablet, Take 1 tablet (325 mg total) by mouth 3 (three) times daily with meals. (Patient not taking: Reported on 05/12/2019), Disp: 90 tablet, Rfl: 2   Allergies  Allergen Reactions  . Other Anaphylaxis and Other (See Comments)    Pt has fish allergy Pt has allergy to butter cookies  . Penicillins Rash and Other (See Comments)    Told MD that PCN reaction is rash and that she has taken amoxicillin before     Review of Systems  HA's are still present  And gets them on her L temple and forehead. Had  vision exam 2 weeks ago. Denies dizziness, chest pains, tinnitus, sweating,  SOB, edema, paresthesia. Admits being stressed, and fatigued. She has never completed the sleep study and is supposed to call them back to make apt when she has help with her kids.  Today's Vitals   05/12/19 1206  BP: (!) 150/102  Pulse: (!) 107  Temp: 98.5 F (36.9 C)  TempSrc: Oral  Weight: (!) 336 lb 12.8 oz (152.8 kg)  Height: 5' 3.4" (1.61 m)   Body mass index is 58.91 kg/m.   Objective:  Physical Exam  Constitutional: She is oriented to person, place, and time. She appears well-developed and well-nourished.  She limps when she walks  HENT:  Head: Normocephalic and atraumatic. Has tenderness on her L temporal muscle. Sinuses are not tender.  Right Ear: External ear normal.  Left Ear: External ear normal.  Nose: Nose normal.  Eyes: Conjunctivae are normal. Right eye exhibits no discharge. Left eye exhibits no discharge. No scleral icterus.  Neck: Neck supple. Has tender and tense  No thyromegaly present.  No carotid bruits bilaterally  Cardiovascular: Normal rate and regular rhythm.  No murmur heard. Pulmonary/Chest: Effort normal and breath sounds normal. No respiratory distress.  Musculoskeletal: Normal range of motion. She exhibits no edema. Has local tenderness on R mid shin where there is mild swelling and superficial abrasion. She cant walk well on heels and barely on tip toes. Her tibia is tender to palpation.  Lymphadenopathy: She has no cervical adenopathy.  Neurological: She is alert and oriented to person, place, and time.  Skin: Skin is warm and dry. Capillary refill takes less than 2 seconds. No rash noted. She is not diaphoretic. No ecchymosis noted on area of injury, only a superficial abrasion with no skin brake on mid shin.  Psychiatric: She appears stressed.  Her behavior is normal. Judgment and thought content normal.  Nursing note reviewed. Assessment And Plan:  1. Need for  influenza vaccination- routine - Flu Vaccine QUAD 6+ mos PF IM (Fluarix Quad PF)  2. Essential hypertension- uncontrolled.  - CMP14 + Anion Gap - CBC with Diff - Lipid Profile I d/c the lisinopril and placed her on Telmisartan 40 mg qd. FU 1 week  Almyra Free from [facility] will check on status of why entresto was covered and not is not. We could not find that rx was filled by pt in our system.  3. Mixed hyperlipidemia- chronic.  - Lipid Profile  4. Contusion of right lower leg, initial encounter- acute - DG Tibia/Fibula Right; Future   Needs to ice area, and may take Tylenol prn pain.  5. Type 2 diabetes mellitus without complication, with long-term current use of insulin ([place])- unknown status since she has not been checking her glucose. She was given a glucometer from here. Almyra Free will check with her insurance which meter is covered.    Antonin Meininger RODRIGUEZ-SOUTHWORTH, [name]    THE PATIENT IS ENCOURAGED TO PRACTICE SOCIAL DISTANCING DUE TO THE COVID-19 PANDEMIC.

## 2019-05-13 LAB — CMP14 + ANION GAP
ALT: 13 IU/L (ref 0–32)
AST: 11 IU/L (ref 0–40)
Albumin/Globulin Ratio: 1.4 (ref 1.2–2.2)
Albumin: 3.9 g/dL (ref 3.8–4.8)
Alkaline Phosphatase: 75 IU/L (ref 39–117)
Anion Gap: 14 mmol/L (ref 10.0–18.0)
BUN/Creatinine Ratio: 10 (ref 9–23)
BUN: 8 mg/dL (ref 6–20)
Bilirubin Total: 0.4 mg/dL (ref 0.0–1.2)
CO2: 23 mmol/L (ref 20–29)
Calcium: 9.3 mg/dL (ref 8.7–10.2)
Chloride: 101 mmol/L (ref 96–106)
Creatinine, Ser: 0.84 mg/dL (ref 0.57–1.00)
GFR calc Af Amer: 103 mL/min/{1.73_m2} (ref >59)
GFR calc non Af Amer: 89 mL/min/{1.73_m2} (ref >59)
Globulin, Total: 2.8 g/dL (ref 1.5–4.5)
Glucose: 108 mg/dL — ABNORMAL HIGH (ref 65–99)
Potassium: 4.2 mmol/L (ref 3.5–5.2)
Sodium: 138 mmol/L (ref 134–144)
Total Protein: 6.7 g/dL (ref 6.0–8.5)

## 2019-05-13 LAB — CBC WITH DIFFERENTIAL/PLATELET
Basophils Absolute: 0 10*3/uL (ref 0.0–0.2)
Basos: 1 %
EOS (ABSOLUTE): 0.3 10*3/uL (ref 0.0–0.4)
Eos: 3 %
Hematocrit: 37.2 % (ref 34.0–46.6)
Hemoglobin: 11.7 g/dL (ref 11.1–15.9)
Immature Grans (Abs): 0 10*3/uL (ref 0.0–0.1)
Immature Granulocytes: 0 %
Lymphocytes Absolute: 2 10*3/uL (ref 0.7–3.1)
Lymphs: 24 %
MCH: 24.6 pg — ABNORMAL LOW (ref 26.6–33.0)
MCHC: 31.5 g/dL (ref 31.5–35.7)
MCV: 78 fL — ABNORMAL LOW (ref 79–97)
Monocytes Absolute: 0.5 10*3/uL (ref 0.1–0.9)
Monocytes: 6 %
Neutrophils Absolute: 5.5 10*3/uL (ref 1.4–7.0)
Neutrophils: 66 %
Platelets: 393 10*3/uL (ref 150–450)
RBC: 4.75 x10E6/uL (ref 3.77–5.28)
RDW: 15.6 % — ABNORMAL HIGH (ref 11.7–15.4)
WBC: 8.4 10*3/uL (ref 3.4–10.8)

## 2019-05-13 LAB — LIPID PANEL
Chol/HDL Ratio: 3.9 ratio (ref 0.0–4.4)
Cholesterol, Total: 146 mg/dL (ref 100–199)
HDL: 37 mg/dL — ABNORMAL LOW (ref >39)
LDL Chol Calc (NIH): 95 mg/dL (ref 0–99)
Triglycerides: 69 mg/dL (ref 0–149)
VLDL Cholesterol Cal: 14 mg/dL (ref 5–40)

## 2019-05-15 ENCOUNTER — Other Ambulatory Visit: Payer: Self-pay | Admitting: Internal Medicine

## 2019-05-15 MED ORDER — ATORVASTATIN CALCIUM 10 MG PO TABS: 10.0000 mg | ORAL_TABLET | Freq: Every day | ORAL | 0 refills | Status: DC

## 2019-05-16 ENCOUNTER — Telehealth: Payer: Self-pay

## 2019-05-18 ENCOUNTER — Telehealth: Payer: Self-pay

## 2019-05-19 ENCOUNTER — Ambulatory Visit: Payer: Medicaid Other | Admitting: Internal Medicine

## 2019-05-19 ENCOUNTER — Other Ambulatory Visit: Payer: Self-pay

## 2019-05-19 ENCOUNTER — Other Ambulatory Visit: Payer: Self-pay | Admitting: Internal Medicine

## 2019-05-19 VITALS — BP 170/98 | HR 78 | Temp 98.5°F | Ht 63.4 in | Wt 336.0 lb

## 2019-05-19 DIAGNOSIS — Z6841 Body Mass Index (BMI) 40.0 and over, adult: Secondary | ICD-10-CM

## 2019-05-19 DIAGNOSIS — I10 Essential (primary) hypertension: Secondary | ICD-10-CM

## 2019-05-19 DIAGNOSIS — F419 Anxiety disorder, unspecified: Secondary | ICD-10-CM | POA: Diagnosis not present

## 2019-05-19 DIAGNOSIS — E1165 Type 2 diabetes mellitus with hyperglycemia: Secondary | ICD-10-CM | POA: Diagnosis not present

## 2019-05-19 MED ORDER — LOSARTAN POTASSIUM-HCTZ 50-12.5 MG PO TABS: 1.0000 | ORAL_TABLET | Freq: Every day | ORAL | 0 refills | Status: DC

## 2019-05-19 NOTE — Progress Notes (Signed)
Losartan rx sent.

## 2019-05-19 NOTE — Progress Notes (Signed)
Subjective:     Patient ID: [name] , female    DOB: 06-14-1981 , 38 y.o.   MRN: [id number]   Chief Complaint  Patient presents with  . Hypertension    HPI HTN FU Pt here for HTN FU and I received an form from her insurance that the Telmisartan was not covered. Today I sent losartan-HCTZ 50/12.5 but she has not picked it up yet. She has been taking the Lisinopril in the mean time. She admits of increased stress and taking her alprazolam qhs. She has been getting cluster HA's due to the stress. DM FU Denies having the time to check her glucose though we gave her a glucometer last week. Denies polyuria or polyphagia.   Past Medical History:  Diagnosis Date  . Abnormal Pap smear    f/u wnl  . Asthma   . Fibroid   . Gestational diabetes   . Hypertension   . Morbid obesity ([place])   . Sleep apnea   . Urinary tract infection      Family History  Problem Relation Age of Onset  . Diabetes Father   . Allergies Mother   . Asthma Mother   . Hypertension Mother   . Diabetes Mother   . Diabetes Maternal Grandmother   . Heart disease Maternal Grandmother   . Anesthesia problems Neg Hx   . Hypotension Neg Hx   . Malignant hyperthermia Neg Hx   . Pseudochol deficiency Neg Hx   . Hearing loss Neg Hx      Current Outpatient Medications:  .  albuterol (PROVENTIL HFA;VENTOLIN HFA) 108 (90 BASE) MCG/ACT inhaler, Inhale 2-3 puffs into the lungs every 6 (six) hours as needed for wheezing. Asthma/SOB, Disp: , Rfl:  .  albuterol (PROVENTIL) (5 MG/ML) 0.5% nebulizer solution, Take 0.5 mLs (2.5 mg total) by nebulization every 6 (six) hours as needed for wheezing or shortness of breath., Disp: 20 mL, Rfl: 12 .  ALPRAZolam (XANAX) 0.5 MG tablet, Take 1 tablet (0.5 mg total) by mouth 2 (two) times daily as needed for anxiety or sleep., Disp: 30 tablet, Rfl: 0 .  atorvastatin (LIPITOR) 10 MG tablet, Take 1 tablet (10 mg total) by mouth daily., Disp: 90 tablet, Rfl: 0 .  Blood Glucose  Monitoring Suppl (ACCU-CHEK AVIVA) device, USE TO CHECK BLOOD SUGARS UP TO 4 TIMES DAILY  DX CODE:E11.9, Disp: 1 each, Rfl: 0 .  cetirizine (ZYRTEC ALLERGY) 10 MG tablet, Take 1 tablet (10 mg total) by mouth daily., Disp: 90 tablet, Rfl: 0 .  EPINEPHrine (EPIPEN 2-PAK) 0.3 mg/0.3 mL IJ SOAJ injection, Inject 0.3 mLs (0.3 mg total) into the muscle once as needed (for severe allergic reaction). CAll 911 immediately if you have to use this medicine, Disp: 0.3 mL, Rfl: 1 .  ferrous sulfate 325 (65 FE) MG EC tablet, Take 1 tablet (325 mg total) by mouth 3 (three) times daily with meals., Disp: 90 tablet, Rfl: 2 .  glucose blood test strip, USE TO CHECK BLOOD SUGARS UP TO 4 TIMES DAILY  DX CODE:E11.9, Disp: 100 each, Rfl: 12 .  iron polysaccharides (NIFEREX) 150 MG capsule, Take 1 capsule (150 mg total) by mouth daily., Disp: 90 capsule, Rfl: 2 .  Lancets (ACCU-CHEK MULTICLIX) lancets, USE TO CHECK BLOOD SUGARS UP TO 4 TIMES DAILY  DX CODE:E11.9, Disp: 100 each, Rfl: 12 .  losartan-hydrochlorothiazide (HYZAAR) 50-12.5 MG tablet, Take 1 tablet by mouth daily., Disp: 30 tablet, Rfl: 0 .  metFORMIN (GLUCOPHAGE) 1000 MG tablet,  Take 1 tablet (1,000 mg total) by mouth 2 (two) times daily with a meal., Disp: 180 tablet, Rfl: 1 .  Semaglutide,0.25 or 0.5MG /DOS, (OZEMPIC, 0.25 OR 0.5 MG/DOSE,) 2 MG/1.5ML SOPN, Inject 0.5 mg into the skin once a week., Disp: 1 pen, Rfl: 3   Allergies  Allergen Reactions  . Other Anaphylaxis and Other (See Comments)    Pt has fish allergy Pt has allergy to butter cookies  . Penicillins Rash and Other (See Comments)    Told MD that PCN reaction is rash and that she has taken amoxicillin before     Review of Systems  + HA's, increased stress, trouble with sleeping, her R lower leg is still sore. The rest of the 10 point review of systems is neg.  Today's Vitals   05/19/19 1121  BP: (!) 170/98  Pulse: 78  Temp: 98.5 F (36.9 C)  TempSrc: Oral  Weight: (!) 336 lb (152.4 kg)   Height: 5' 3.4" (1.61 m)   Body mass index is 58.77 kg/m.   Objective:  Physical Exam   Constitutional: She is oriented to person, place, and time. She appears well-developed and well-nourished. She became tearful as she shared how stressed she feels.  HENT:  Head: Normocephalic and atraumatic.  Right Ear: External ear normal.  Left Ear: External ear normal.  Nose: Nose normal.  Eyes: Conjunctivae are normal. Right eye exhibits no discharge. Left eye exhibits no discharge. No scleral icterus.  Neck: Neck supple. No thyromegaly present.  No carotid bruits bilaterally  Cardiovascular: Normal rate and regular rhythm.  No murmur heard. Pulmonary/Chest: Effort normal and breath sounds normal. No respiratory distress.  Musculoskeletal: Normal range of motion. She exhibits no edema. R shin with fainting ecchymosis and area is sore.  Lymphadenopathy:    She has no cervical adenopathy.  Neurological: She is alert and oriented to person, place, and time.  Skin: Skin is warm and dry. Capillary refill takes less than 2 seconds. No rash noted. She is not diaphoretic.  Psychiatric: She has a normal mood and affect. Her behavior is normal. Judgment and thought content normal.  Nursing note reviewed.     Assessment And Plan:     1. Uncontrolled type 2 diabetes mellitus with hyperglycemia ([place])- chronic. She promised she would bring her glucometer next week.  Will continue same meds and FU next week.    2. Essential hypertension- uncontrolled. Needs to start the Losartan as soon as she picks it up today  3. Anxiety- acute. Will continue with current medication.   4. Class 3 severe obesity due to excess calories with serious comorbidity and body mass index (BMI) of 50.0 to 59.9 in adult [facility])- chronic. Encouraged again, the importance of wt loss and making good choices.    Brittney Mucha RODRIGUEZ-SOUTHWORTH, [name]    THE PATIENT IS ENCOURAGED TO PRACTICE SOCIAL DISTANCING DUE TO THE COVID-19  PANDEMIC.

## 2019-05-20 ENCOUNTER — Encounter: Payer: Self-pay | Admitting: Internal Medicine

## 2019-05-25 ENCOUNTER — Ambulatory Visit: Payer: Self-pay

## 2019-05-25 DIAGNOSIS — I10 Essential (primary) hypertension: Secondary | ICD-10-CM

## 2019-05-25 DIAGNOSIS — E1165 Type 2 diabetes mellitus with hyperglycemia: Secondary | ICD-10-CM

## 2019-05-25 NOTE — Chronic Care Management (AMB) (Signed)
Care Management   Follow Up Note   05/25/2019 Name: [name] MRN: [id number] DOB: [birth year]  Referred by: Minette Brine, FNP Reason for referral : Care Coordination   [name] is a 38 y.o. year old female who is a primary care patient of Minette Brine, [place]. The care management team was consulted for assistance with care management and care coordination needs.    Review of patient status, including review of consultants reports, relevant laboratory and other test results, and collaboration with appropriate care team members and the patient's provider was performed as part of comprehensive patient evaluation and provision of chronic care management services.    Goals Addressed            This Visit's Progress     Patient Stated   . COMPLETED: "I am out of my blood pressure medicine and really need it" (pt-stated)       Current Barriers:  . Limited understanding of process to obtain prescription refills  Clinical Social Work Clinical Goal(s):  [name] Over the next 3 days the patient will obtain medications from her neighborhood pharmacy  CCM SW Interventions: Completed 05/25/2019 . Outbound call to assess progression of patient goal . Confirmed the patient is in possession of needed medications and is administering daily o "my headaches have gotten much better so I know my blood pressure is going down like it should" . Goal Met  CCM PharmD Interventions: Completed 05/05/2019 . Audery Amel, [name] called in lisinopril for BP management.  Patient has taken in the past and tolerated well.  Also, called in glucometer per patient request. . Encouraged patient to check blood pressure often . PCP followup next week  Patient Self Care Activities:  . Currently UNABLE TO independently manage medications related to chronic conditions  Please see past updates related to this goal by clicking on the "Past Updates" button in the selected goal      . "I don't have a blood  pressure cuff" (pt-stated)       Current Barriers:  . Financial constraints related to cost of equipment . Limited knowledge of how to obtain equipment under insurance plan  Social Work Clinical Goal(s):  [name] Over the next 20 days the patient will follow up with her neighborhood pharmacy to obtain a blood pressure cuff as instructed by SW  CCM SW Interventions: . Patient interviewed and appropriate assessments performed . Determined the patient does not have equipment in the home to monitor blood pressure . Discussed the importance of daily monitoring and recording to share results during provider appointments . Informed by the patient she is in possession of a written prescription for a cuff but has yet to follow up due to concern over cost and how to obtain equipment . Advised the patient to contact her local pharmacy to inquire if machine is in stock prior to dropping off prescription . Discussed opportunity for health plan to assist with cost considering the patient has a written prescription . Encouraged the patient to contact the CM team for assistance with care coordination if needed  Patient Self Care Activities:  . Self administers medications as prescribed . Calls pharmacy for medication refills . Calls provider office for new concerns or questions  Initial goal documentation         SW will follow up with the patient over the next month.  [name], BSW, CDP Social Worker, Certified Dementia Practitioner [place] [facility] [place] [facility] [phone]

## 2019-05-25 NOTE — Patient Instructions (Signed)
Visit Information  Goals Addressed            This Visit's Progress     Patient Stated   . COMPLETED: "I am out of my blood pressure medicine and really need it" (pt-stated)       Current Barriers:  . Limited understanding of process to obtain prescription refills  Clinical Social Work Clinical Goal(s):  [name] Over the next 3 days the patient will obtain medications from her neighborhood pharmacy  CCM SW Interventions: Completed 05/25/2019 . Outbound call to assess progression of patient goal . Confirmed the patient is in possession of needed medications and is administering daily o "my headaches have gotten much better so I know my blood pressure is going down like it should" . Goal Met  CCM PharmD Interventions: Completed 05/05/2019 . Audery Amel, [name] called in lisinopril for BP management.  Patient has taken in the past and tolerated well.  Also, called in glucometer per patient request. . Encouraged patient to check blood pressure often . PCP followup next week  Patient Self Care Activities:  . Currently UNABLE TO independently manage medications related to chronic conditions  Please see past updates related to this goal by clicking on the "Past Updates" button in the selected goal      . "I don't have a blood pressure cuff" (pt-stated)       Current Barriers:  . Financial constraints related to cost of equipment . Limited knowledge of how to obtain equipment under insurance plan  Social Work Clinical Goal(s):  [name] Over the next 20 days the patient will follow up with her neighborhood pharmacy to obtain a blood pressure cuff as instructed by SW  CCM SW Interventions: . Patient interviewed and appropriate assessments performed . Determined the patient does not have equipment in the home to monitor blood pressure . Discussed the importance of daily monitoring and recording to share results during provider appointments . Informed by the patient she is in possession of  a written prescription for a cuff but has yet to follow up due to concern over cost and how to obtain equipment . Advised the patient to contact her local pharmacy to inquire if machine is in stock prior to dropping off prescription . Discussed opportunity for health plan to assist with cost considering the patient has a written prescription . Encouraged the patient to contact the CM team for assistance with care coordination if needed  Patient Self Care Activities:  . Self administers medications as prescribed . Calls pharmacy for medication refills . Calls provider office for new concerns or questions  Initial goal documentation        SW will follow up with the patient over the next month  [name], BSW, CDP Social Worker, Certified Dementia Practitioner [place] [facility] [place] [facility] [phone]

## 2019-05-26 ENCOUNTER — Ambulatory Visit: Payer: Medicaid Other | Admitting: Nurse Practitioner

## 2019-05-26 ENCOUNTER — Ambulatory Visit: Payer: Self-pay | Admitting: Pharmacist

## 2019-05-26 DIAGNOSIS — E1165 Type 2 diabetes mellitus with hyperglycemia: Secondary | ICD-10-CM

## 2019-05-26 DIAGNOSIS — I10 Essential (primary) hypertension: Secondary | ICD-10-CM

## 2019-05-27 ENCOUNTER — Encounter: Payer: Self-pay | Admitting: Obstetrics and Gynecology

## 2019-05-27 ENCOUNTER — Encounter: Payer: Medicaid Other | Admitting: Obstetrics and Gynecology

## 2019-05-30 NOTE — Progress Notes (Signed)
Patient did not keep her GYN referral appointment for 05/27/2019.  Durene Romans MD Attending Center for [employer] Fish farm manager)

## 2019-05-30 NOTE — Progress Notes (Signed)
Chronic Care Management   Visit Note  05/26/2019 Name: EUGENA RHUE MRN: [id number] DOB: January 18, 1981  Referred by: Minette Brine, FNP Reason for referral : Chronic Care Management   BRAILEIGH LANDENBERGER is a 38 y.o. year old female who is a primary care patient of Minette Brine, [place]. The CCM team was consulted for assistance with chronic disease management and care coordination needs related to HTN  Review of patient status, including review of consultants reports, relevant laboratory and other test results, and collaboration with appropriate care team members and the patient's provider was performed as part of comprehensive patient evaluation and provision of chronic care management services.    I spoke with [name] by telephone today.  Advanced Directives Status: N See Care Plan and Vyn[facility] application for related entries.   Medications: Outpatient Encounter Medications as of 05/26/2019  Medication Sig  . albuterol (PROVENTIL HFA;VENTOLIN HFA) 108 (90 BASE) MCG/ACT inhaler Inhale 2-3 puffs into the lungs every 6 (six) hours as needed for wheezing. Asthma/SOB  . albuterol (PROVENTIL) (5 MG/ML) 0.5% nebulizer solution Take 0.5 mLs (2.5 mg total) by nebulization every 6 (six) hours as needed for wheezing or shortness of breath.  . ALPRAZolam (XANAX) 0.5 MG tablet Take 1 tablet (0.5 mg total) by mouth 2 (two) times daily as needed for anxiety or sleep.  [name] atorvastatin (LIPITOR) 10 MG tablet Take 1 tablet (10 mg total) by mouth daily.  . Blood Glucose Monitoring Suppl (ACCU-CHEK AVIVA) device USE TO CHECK BLOOD SUGARS UP TO 4 TIMES DAILY  DX CODE:E11.9  . cetirizine (ZYRTEC ALLERGY) 10 MG tablet Take 1 tablet (10 mg total) by mouth daily.  [name] EPINEPHrine (EPIPEN 2-PAK) 0.3 mg/0.3 mL IJ SOAJ injection Inject 0.3 mLs (0.3 mg total) into the muscle once as needed (for severe allergic reaction). CAll 911 immediately if you have to use this medicine  . ferrous sulfate 325 (65 FE) MG EC tablet Take 1  tablet (325 mg total) by mouth 3 (three) times daily with meals.  [name] glucose blood test strip USE TO CHECK BLOOD SUGARS UP TO 4 TIMES DAILY  DX CODE:E11.9  . iron polysaccharides (NIFEREX) 150 MG capsule Take 1 capsule (150 mg total) by mouth daily.  . Lancets (ACCU-CHEK MULTICLIX) lancets USE TO CHECK BLOOD SUGARS UP TO 4 TIMES DAILY  DX CODE:E11.9  . losartan-hydrochlorothiazide (HYZAAR) 50-12.5 MG tablet Take 1 tablet by mouth daily.  . metFORMIN (GLUCOPHAGE) 1000 MG tablet Take 1 tablet (1,000 mg total) by mouth 2 (two) times daily with a meal.  . Semaglutide,0.25 or 0.5MG/DOS, (OZEMPIC, 0.25 OR 0.5 MG/DOSE,) 2 MG/1.5ML SOPN Inject 0.5 mg into the skin once a week.   No facility-administered encounter medications on file as of 05/26/2019.      Objective:   Goals Addressed            This Visit's Progress     Patient Stated   . COMPLETED: "I am out of my blood pressure medicine and really need it" (pt-stated)       Current Barriers:  . Limited understanding of process to obtain prescription refills  Clinical Social Work Clinical Goal(s):  [name] Over the next 3 days the patient will obtain medications from her neighborhood pharmacy  CCM SW Interventions: Completed 05/25/2019 . Outbound call to assess progression of patient goal . Confirmed the patient is in possession of needed medications and is administering daily o "my headaches have gotten much better so I know my blood pressure is going  down like it should" . Goal Met  CCM PharmD Interventions: Completed 10/22/202 . Patient was prescribed lisinopril initially, however her blood pressures were still elevated.  PCP switched patient to losartan/HCTZ.   . Encouraged patient to check blood pressure at home.  She states she "feels better overall" and is thankful for our help . PCP followup next week.  Patient Self Care Activities:  . Currently UNABLE TO independently manage medications related to chronic conditions  Please  see past updates related to this goal by clicking on the "Past Updates" button in the selected goal          Plan:   Follow up with provider re: blood pressure control  Will follow up as needed.  Encouraged patient to call PCP office or PharmD if questions or needs arise.  Provider Signature Regina Eck, PharmD, BCPS Clinical Pharmacist, [facility] [place]: [phone]

## 2019-06-13 ENCOUNTER — Telehealth: Payer: Self-pay

## 2019-06-13 ENCOUNTER — Ambulatory Visit (INDEPENDENT_AMBULATORY_CARE_PROVIDER_SITE_OTHER): Payer: Medicaid Other | Admitting: Family Medicine

## 2019-06-13 ENCOUNTER — Encounter: Payer: Self-pay | Admitting: Family Medicine

## 2019-06-13 ENCOUNTER — Other Ambulatory Visit: Payer: Self-pay

## 2019-06-13 ENCOUNTER — Other Ambulatory Visit (HOSPITAL_COMMUNITY)
Admission: RE | Admit: 2019-06-13 | Discharge: 2019-06-13 | Disposition: A | Discharge status: Home / Self Care | Payer: Medicaid Other | Source: Ambulatory Visit | Attending: Family Medicine | Admitting: Family Medicine

## 2019-06-13 VITALS — BP 166/105 | HR 109 | Wt 335.3 lb

## 2019-06-13 DIAGNOSIS — N939 Abnormal uterine and vaginal bleeding, unspecified: Secondary | ICD-10-CM

## 2019-06-13 DIAGNOSIS — D219 Benign neoplasm of connective and other soft tissue, unspecified: Secondary | ICD-10-CM | POA: Diagnosis not present

## 2019-06-13 DIAGNOSIS — Z124 Encounter for screening for malignant neoplasm of cervix: Secondary | ICD-10-CM | POA: Insufficient documentation

## 2019-06-13 MED ORDER — DICLOFENAC SODIUM 50 MG PO TBEC: 50.0000 mg | DELAYED_RELEASE_TABLET | Freq: Three times a day (TID) | ORAL | 3 refills | Status: DC

## 2019-06-13 NOTE — Addendum Note (Signed)
Addended by: Michel Harrow on: 06/13/2019 03:37 PM   Modules accepted: Orders

## 2019-06-13 NOTE — Progress Notes (Signed)
   Subjective:    Patient ID: [name], female    DOB: Mar 22, 1981, 38 y.o.   MRN: [id number]  HPI Patient referred here for menorrhagia with normal cycle. Has been this way for past 7-8 years. Has heavy bleeding and lots of cramping. Has been diagnosed with fibroids previously. [place] on 02/02/2017 showed multiple fibroids, largest 3cm. Thin endometrium at that appointment. Has been diagnosed with anemia and takes iron.  Has had BTL, D&C, c/s x2.   I have reviewed the patients past medical, family, and social history.  I have reviewed the patient's medication list and allergies.   Review of Systems     Objective:   Physical Exam Exam conducted with a chaperone present.  Constitutional:      Appearance: Normal appearance.  HENT:     Head: Normocephalic and atraumatic.  Cardiovascular:     Rate and Rhythm: Normal rate and regular rhythm.     Pulses: Normal pulses.  Pulmonary:     Effort: Pulmonary effort is normal.  Abdominal:     Hernia: There is no hernia in the left inguinal area or right inguinal area.  Genitourinary:    Labia:        Right: No rash, tenderness or lesion.        Left: No rash, tenderness or lesion.      Vagina: No signs of injury and foreign body. No vaginal discharge, erythema, tenderness, bleeding, lesions or prolapsed vaginal walls.     Cervix: No cervical motion tenderness, discharge, friability, lesion, erythema or cervical bleeding.  Lymphadenopathy:     Lower Body: No right inguinal adenopathy. No left inguinal adenopathy.  Neurological:     Mental Status: She is alert.       Assessment & Plan:  1. Abnormal uterine bleeding (AUB) - [place] GYN Transvaginal; Future - [place] GYN Pelvis Complete; Future  2. Fibroids Rpt [place]. Diclofenac for pain. discussed medical vs surgical interventions. Patient does not want to do OCPs, progestins, IUD. Interested in hysterectomy. F/u with gyn surgeon

## 2019-06-17 LAB — CYTOLOGY - PAP
Comment: NEGATIVE
Diagnosis: NEGATIVE
High risk HPV: NEGATIVE

## 2019-06-20 DIAGNOSIS — H10413 Chronic giant papillary conjunctivitis, bilateral: Secondary | ICD-10-CM | POA: Diagnosis not present

## 2019-06-20 DIAGNOSIS — H16142 Punctate keratitis, left eye: Secondary | ICD-10-CM | POA: Diagnosis not present

## 2019-06-21 ENCOUNTER — Other Ambulatory Visit: Payer: Self-pay

## 2019-06-21 ENCOUNTER — Ambulatory Visit: Payer: Self-pay | Admitting: Pharmacist

## 2019-06-21 ENCOUNTER — Ambulatory Visit: Payer: Self-pay

## 2019-06-21 DIAGNOSIS — E1165 Type 2 diabetes mellitus with hyperglycemia: Secondary | ICD-10-CM

## 2019-06-21 DIAGNOSIS — I10 Essential (primary) hypertension: Secondary | ICD-10-CM

## 2019-06-21 MED ORDER — LOSARTAN POTASSIUM-HCTZ 50-12.5 MG PO TABS: 1.0000 | ORAL_TABLET | Freq: Every day | ORAL | 0 refills | Status: DC

## 2019-06-21 NOTE — Patient Instructions (Signed)
Visit Information  Goals Addressed            This Visit's Progress     Patient Stated   . "I don't have a blood pressure cuff" (pt-stated)   On track    Current Barriers:  . Financial constraints related to cost of equipment . Limited knowledge of how to obtain equipment under insurance plan  Social Work Clinical Goal(s):  [name] Over the next 20 days the patient will follow up with her neighborhood pharmacy to obtain a blood pressure cuff as instructed by SW completed/ not met due to insurance restrictions . 06/21/2019 New: Over the next 15 days the patient will work with CM team to obtain an automatic blood pressure kit under her Medicaid benefit  CCM SW Interventions: Completed 06/21/2019 . Outbound call to the patient to assess progression of patient goal . Determined the patient has yet to find a retail pharmacy and/or DME supplier whom is agreeable to file insurance o "I have tried [facility], and other pharmacies and no one will take it. I can't afford to pay out of pocket" . Accessed [place] DHHS Website to assist with determining benefit coverage . Advised the patient that per the [facility] website the equipment should be covered under her health plan: Effective November 01, 2018, [place] coverage was added for HCPCS code [phone] - automatic blood pressure monitor. If medically necessary, this item may be provided without prior authorization to beneficiaries who are required to independently monitor their blood pressure from home.  Brendia Sacks call placed to both [facility] [employer] and [place] to inquire if patient insurance plan acceptable coverage. Both informed SW they do not file insurance for BP systems . Outbound call to [place], spoke with representative Kayla o Informed rule is still effective allowing DME companies to bill Medicaid for Automatic BP monitor o Provided with CPT Code [phone] o Advised  to have patient provider send prescription directly to DME supplier with code for insurance approval . Outbound call placed to the patient to provide update on above interventions. Advised the patient to expect contact from a member of the CM team over the next week regarding follow up . Collaboration with embedded PharmD [name] whom plans to follow up with the provider and the patient while in clinic on Wednesday 06/22/2019 o Provided mrs. Pruitt with CPT code needed to obtain equipment   Patient Self Care Activities:  . Self administers medications as prescribed . Calls pharmacy for medication refills . Calls provider office for new concerns or questions  Please see past updates related to this goal by clicking on the "Past Updates" button in the selected goal         Follow up plan:  Our pharmacist, Almyra Free, will be in the office on Wednesday 11/18. She will work on getting a prescription with the appropriate code sent to an accepting supply store.   I will call you next month to see how you are doing.    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   Please call me if you need assistance prior to our next call.  [name], BSW, CDP Social Worker, Certified Dementia Practitioner [place] [facility] [place] [facility] [phone]

## 2019-06-21 NOTE — Chronic Care Management (AMB) (Signed)
Care Management     Social Work Follow Up Note  06/21/2019 Name: [name] MRN: [id number] DOB: 1981-05-27  [name] is a 38 y.o. year old female who is a primary care patient of Minette Brine, [place]. The CCM team was consulted for assistance with care coordination.   Review of patient status, including review of consultants reports, other relevant assessments, and collaboration with appropriate care team members and the patient's provider was performed as part of comprehensive patient evaluation and provision of chronic care management services.    SW placed an outbound call to the patient to assess progression of patient goal to obtain a BP cuff.  Outpatient Encounter Medications as of 06/21/2019  Medication Sig  . albuterol (PROVENTIL HFA;VENTOLIN HFA) 108 (90 BASE) MCG/ACT inhaler Inhale 2-3 puffs into the lungs every 6 (six) hours as needed for wheezing. Asthma/SOB  . albuterol (PROVENTIL) (5 MG/ML) 0.5% nebulizer solution Take 0.5 mLs (2.5 mg total) by nebulization every 6 (six) hours as needed for wheezing or shortness of breath.  . ALPRAZolam (XANAX) 0.5 MG tablet Take 1 tablet (0.5 mg total) by mouth 2 (two) times daily as needed for anxiety or sleep.  [name] atorvastatin (LIPITOR) 10 MG tablet Take 1 tablet (10 mg total) by mouth daily.  . Blood Glucose Monitoring Suppl (ACCU-CHEK AVIVA) device USE TO CHECK BLOOD SUGARS UP TO 4 TIMES DAILY  DX CODE:E11.9  . cetirizine (ZYRTEC ALLERGY) 10 MG tablet Take 1 tablet (10 mg total) by mouth daily.  . diclofenac (VOLTAREN) 50 MG EC tablet Take 1 tablet (50 mg total) by mouth 3 (three) times daily.  [name] EPINEPHrine (EPIPEN 2-PAK) 0.3 mg/0.3 mL IJ SOAJ injection Inject 0.3 mLs (0.3 mg total) into the muscle once as needed (for severe allergic reaction). CAll 911 immediately if you have to use this medicine  . ferrous sulfate 325 (65 FE) MG EC tablet Take 1 tablet (325 mg total) by mouth 3 (three) times daily with meals.  [name] glucose blood  test strip USE TO CHECK BLOOD SUGARS UP TO 4 TIMES DAILY  DX CODE:E11.9  . iron polysaccharides (NIFEREX) 150 MG capsule Take 1 capsule (150 mg total) by mouth daily.  . Lancets (ACCU-CHEK MULTICLIX) lancets USE TO CHECK BLOOD SUGARS UP TO 4 TIMES DAILY  DX CODE:E11.9  . losartan-hydrochlorothiazide (HYZAAR) 50-12.5 MG tablet Take 1 tablet by mouth daily.  . metFORMIN (GLUCOPHAGE) 1000 MG tablet Take 1 tablet (1,000 mg total) by mouth 2 (two) times daily with a meal.  . Semaglutide,0.25 or 0.5MG/DOS, (OZEMPIC, 0.25 OR 0.5 MG/DOSE,) 2 MG/1.5ML SOPN Inject 0.5 mg into the skin once a week.   No facility-administered encounter medications on file as of 06/21/2019.      Goals Addressed            This Visit's Progress     Patient Stated   . "I don't have a blood pressure cuff" (pt-stated)   On track    Current Barriers:  . Financial constraints related to cost of equipment . Limited knowledge of how to obtain equipment under insurance plan  Social Work Clinical Goal(s):  [name] Over the next 20 days the patient will follow up with her neighborhood pharmacy to obtain a blood pressure cuff as instructed by SW completed/ not met due to insurance restrictions . 06/21/2019 New: Over the next 15 days the patient will work with CM team to obtain an automatic blood pressure kit under her Medicaid benefit  CCM SW Interventions: Completed  06/21/2019 . Outbound call to the patient to assess progression of patient goal . Determined the patient has yet to find a retail pharmacy and/or DME supplier whom is agreeable to file insurance o "I have tried [place], and other pharmacies and no one will take it. I can't afford to pay out of pocket" . Accessed [place] DHHS Website to assist with determining benefit coverage . Advised the patient that per the [facility] website the equipment should be covered under her health plan: Effective November 01, 2018, [place] and [place] coverage was added for HCPCS code [phone] - automatic blood pressure monitor. If medically necessary, this item may be provided without prior authorization to beneficiaries who are required to independently monitor their blood pressure from home.  Brendia Sacks call placed to both [employer] and [place] to inquire if patient insurance plan acceptable coverage. Both informed SW they do not file insurance for BP systems . Outbound call to [place], spoke with representative Kayla o Informed rule is still effective allowing DME companies to bill Medicaid for Automatic BP monitor o Provided with CPT Code [phone] o Advised to have patient provider send prescription directly to DME supplier with code for insurance approval . Outbound call placed to the patient to provide update on above interventions. Advised the patient to expect contact from a member of the CM team over the next week regarding follow up . Collaboration with embedded PharmD [name] whom plans to follow up with the provider and the patient while in clinic on Wednesday 06/22/2019 o Provided mrs. Pruitt with CPT code needed to obtain equipment   Patient Self Care Activities:  . Self administers medications as prescribed . Calls pharmacy for medication refills . Calls provider office for new concerns or questions  Please see past updates related to this goal by clicking on the "Past Updates" button in the selected goal          Follow Up Plan: SW will follow up with patient by phone over the next month.   [name], BSW, CDP Social Worker, Certified Dementia Practitioner [place] [facility] [place] [facility] [phone]

## 2019-06-22 ENCOUNTER — Ambulatory Visit (HOSPITAL_COMMUNITY)
Admission: RE | Admit: 2019-06-22 | Discharge: 2019-06-22 | Disposition: A | Discharge status: Home / Self Care | Payer: Medicaid Other | Source: Ambulatory Visit | Attending: Family Medicine | Admitting: Family Medicine

## 2019-06-22 ENCOUNTER — Other Ambulatory Visit: Payer: Self-pay

## 2019-06-22 DIAGNOSIS — N939 Abnormal uterine and vaginal bleeding, unspecified: Secondary | ICD-10-CM

## 2019-06-22 DIAGNOSIS — D259 Leiomyoma of uterus, unspecified: Secondary | ICD-10-CM | POA: Diagnosis not present

## 2019-06-23 DIAGNOSIS — I10 Essential (primary) hypertension: Secondary | ICD-10-CM | POA: Diagnosis not present

## 2019-06-24 ENCOUNTER — Telehealth: Payer: Self-pay

## 2019-06-24 NOTE — Progress Notes (Signed)
Chronic Care Management   Visit Note  06/22/2019 Name: [name] MRN: [id number] DOB: 04-Dec-1980  Referred by: Minette Brine, FNP Reason for referral : Chronic Care Management   [name] is a 38 y.o. year old female who is a primary care patient of Minette Brine, [place]. The CCM team was consulted for assistance with chronic disease management and care coordination needs related to HTN  Review of patient status, including review of consultants reports, relevant laboratory and other test results, and collaboration with appropriate care team members and the patient's provider was performed as part of comprehensive patient evaluation and provision of chronic care management services.    I spoke with [name] by telephone today.  Medications: Outpatient Encounter Medications as of 06/21/2019  Medication Sig  . albuterol (PROVENTIL HFA;VENTOLIN HFA) 108 (90 BASE) MCG/ACT inhaler Inhale 2-3 puffs into the lungs every 6 (six) hours as needed for wheezing. Asthma/SOB  . albuterol (PROVENTIL) (5 MG/ML) 0.5% nebulizer solution Take 0.5 mLs (2.5 mg total) by nebulization every 6 (six) hours as needed for wheezing or shortness of breath.  . ALPRAZolam (XANAX) 0.5 MG tablet Take 1 tablet (0.5 mg total) by mouth 2 (two) times daily as needed for anxiety or sleep.  [name] atorvastatin (LIPITOR) 10 MG tablet Take 1 tablet (10 mg total) by mouth daily.  . Blood Glucose Monitoring Suppl (ACCU-CHEK AVIVA) device USE TO CHECK BLOOD SUGARS UP TO 4 TIMES DAILY  DX CODE:E11.9  . cetirizine (ZYRTEC ALLERGY) 10 MG tablet Take 1 tablet (10 mg total) by mouth daily.  . diclofenac (VOLTAREN) 50 MG EC tablet Take 1 tablet (50 mg total) by mouth 3 (three) times daily.  [name] EPINEPHrine (EPIPEN 2-PAK) 0.3 mg/0.3 mL IJ SOAJ injection Inject 0.3 mLs (0.3 mg total) into the muscle once as needed (for severe allergic reaction). CAll 911 immediately if you have to use this medicine  . ferrous sulfate 325 (65 FE) MG EC  tablet Take 1 tablet (325 mg total) by mouth 3 (three) times daily with meals.  [name] glucose blood test strip USE TO CHECK BLOOD SUGARS UP TO 4 TIMES DAILY  DX CODE:E11.9  . iron polysaccharides (NIFEREX) 150 MG capsule Take 1 capsule (150 mg total) by mouth daily.  . Lancets (ACCU-CHEK MULTICLIX) lancets USE TO CHECK BLOOD SUGARS UP TO 4 TIMES DAILY  DX CODE:E11.9  . losartan-hydrochlorothiazide (HYZAAR) 50-12.5 MG tablet Take 1 tablet by mouth daily.  . metFORMIN (GLUCOPHAGE) 1000 MG tablet Take 1 tablet (1,000 mg total) by mouth 2 (two) times daily with a meal.  . Semaglutide,0.25 or 0.5MG/DOS, (OZEMPIC, 0.25 OR 0.5 MG/DOSE,) 2 MG/1.5ML SOPN Inject 0.5 mg into the skin once a week.   No facility-administered encounter medications on file as of 06/21/2019.      Objective:   Goals Addressed            This Visit's Progress     Patient Stated   . "I don't have a blood pressure cuff" (pt-stated)       Current Barriers:  . Financial constraints related to cost of equipment . Limited knowledge of how to obtain equipment under insurance plan  Social Work Clinical Goal(s):  [name] Over the next 20 days the patient will follow up with her neighborhood pharmacy to obtain a blood pressure cuff as instructed by SW completed/ not met due to insurance restrictions . 06/21/2019 New: Over the next 15 days the patient will work with CM team to obtain an automatic  blood pressure kit under her Medicaid benefit  CCM SW Interventions: Completed 06/21/2019 . Outbound call to the patient to assess progression of patient goal . Determined the patient has yet to find a retail pharmacy and/or DME supplier whom is agreeable to file insurance o "I have tried [place], and other pharmacies and no one will take it. I can't afford to pay out of pocket" . Accessed [place] DHHS Website to assist with determining benefit coverage . Advised the patient that per the [facility] website the equipment  should be covered under her health plan: Effective November 01, 2018, [place] and [place] coverage was added for HCPCS code [phone] - automatic blood pressure monitor. If medically necessary, this item may be provided without prior authorization to beneficiaries who are required to independently monitor their blood pressure from home.  Brendia Sacks call placed to both [employer] and [place] to inquire if patient insurance plan acceptable coverage. Both informed SW they do not file insurance for BP systems . Outbound call to [place], spoke with representative Kayla o Informed rule is still effective allowing DME companies to bill Medicaid for Automatic BP monitor o Provided with CPT Code [phone] o Advised to have patient provider send prescription directly to DME supplier with code for insurance approval . Outbound call placed to the patient to provide update on above interventions. Advised the patient to expect contact from a member of the CM team over the next week regarding follow up . Collaboration with embedded PharmD [name] whom plans to follow up with the provider and the patient while in clinic on Wednesday 06/22/2019 o Provided [name] with CPT code needed to obtain equipment o  CCM PharmD Interventions: Completed 06/22/2019 . Call completed to [facility] HME/DME.  They states they would accept patient's insurance . Faxed over prescription and requested information . Blood pressure cuff filled . Will pick up and bring to PCP office.  Patient verbalizes understanding as is in agreement with plan.  She will pick up BP cuff at PCP office on Monday 11/23.   Patient Self Care Activities:  . Self administers medications as prescribed . Calls pharmacy for medication refills . Calls provider office for new concerns or questions  Please see past updates related to this goal by clicking on the "Past Updates" button in the  selected goal          Plan:   The care management team will reach out to the patient again over the next 5 days.   Provider Signature Regina Eck, PharmD, BCPS Clinical Pharmacist, [facility] [place]: [phone]

## 2019-06-24 NOTE — Patient Instructions (Signed)
Visit Information  Goals Addressed            This Visit's Progress     Patient Stated   . "I don't have a blood pressure cuff" (pt-stated)       Current Barriers:  . Financial constraints related to cost of equipment . Limited knowledge of how to obtain equipment under insurance plan  Social Work Clinical Goal(s):  [name] Over the next 20 days the patient will follow up with her neighborhood pharmacy to obtain a blood pressure cuff as instructed by SW completed/ not met due to insurance restrictions . 06/21/2019 New: Over the next 15 days the patient will work with CM team to obtain an automatic blood pressure kit under her Medicaid benefit  CCM SW Interventions: Completed 06/21/2019 . Outbound call to the patient to assess progression of patient goal . Determined the patient has yet to find a retail pharmacy and/or DME supplier whom is agreeable to file insurance o "I have tried [place], and other pharmacies and no one will take it. I can't afford to pay out of pocket" . Accessed [place] DHHS Website to assist with determining benefit coverage . Advised the patient that per the [facility] website the equipment should be covered under her health plan: Effective November 01, 2018, [place] and [place] coverage was added for HCPCS code [id number] - automatic blood pressure monitor. If medically necessary, this item may be provided without prior authorization to beneficiaries who are required to independently monitor their blood pressure from home.  Brendia Sacks call placed to both [employer] and [place] to inquire if patient insurance plan acceptable coverage. Both informed SW they do not file insurance for BP systems . Outbound call to [place], spoke with representative Kayla o Informed rule is still effective allowing DME companies to bill Medicaid for Automatic BP monitor o Provided with CPT Code [phone] o Advised to have  patient provider send prescription directly to DME supplier with code for insurance approval . Outbound call placed to the patient to provide update on above interventions. Advised the patient to expect contact from a member of the CM team over the next week regarding follow up . Collaboration with embedded PharmD [name] whom plans to follow up with the provider and the patient while in clinic on Wednesday 06/22/2019 o Provided mrs. Jordani Nunn with CPT code needed to obtain equipment o  CCM PharmD Interventions: Completed 06/22/2019 . Call completed to [facility] HME/DME.  They states they would accept patient's insurance . Faxed over prescription and requested information . Blood pressure cuff filled . Will pick up and bring to PCP office.  Patient verbalizes understanding as is in agreement with plan.  She will pick up BP cuff at PCP office on Monday 11/23.   Patient Self Care Activities:  . Self administers medications as prescribed . Calls pharmacy for medication refills . Calls provider office for new concerns or questions  Please see past updates related to this goal by clicking on the "Past Updates" button in the selected goal         The patient verbalized understanding of instructions provided today and declined a print copy of patient instruction materials.   The care management team will reach out to the patient again over the next 5 days.   SIGNATURE  Regina Eck, PharmD, BCPS Clinical Pharmacist, [facility]  Direct Dial: [phone]

## 2019-06-27 DIAGNOSIS — H16142 Punctate keratitis, left eye: Secondary | ICD-10-CM | POA: Diagnosis not present

## 2019-06-27 DIAGNOSIS — H10413 Chronic giant papillary conjunctivitis, bilateral: Secondary | ICD-10-CM | POA: Diagnosis not present

## 2019-06-29 ENCOUNTER — Ambulatory Visit: Payer: Medicaid Other | Admitting: Nurse Practitioner

## 2019-07-05 ENCOUNTER — Encounter: Payer: Self-pay | Admitting: Nurse Practitioner

## 2019-07-05 ENCOUNTER — Encounter (INDEPENDENT_AMBULATORY_CARE_PROVIDER_SITE_OTHER): Payer: Medicaid Other | Admitting: Nurse Practitioner

## 2019-07-05 ENCOUNTER — Other Ambulatory Visit: Payer: Self-pay

## 2019-07-05 DIAGNOSIS — F332 Major depressive disorder, recurrent severe without psychotic features: Secondary | ICD-10-CM | POA: Diagnosis not present

## 2019-07-05 DIAGNOSIS — F411 Generalized anxiety disorder: Secondary | ICD-10-CM | POA: Diagnosis not present

## 2019-07-08 ENCOUNTER — Other Ambulatory Visit: Payer: Self-pay

## 2019-07-08 ENCOUNTER — Other Ambulatory Visit: Payer: Self-pay | Admitting: Podiatry

## 2019-07-08 ENCOUNTER — Ambulatory Visit (INDEPENDENT_AMBULATORY_CARE_PROVIDER_SITE_OTHER): Payer: Medicaid Other

## 2019-07-08 ENCOUNTER — Ambulatory Visit: Payer: Medicaid Other | Admitting: Podiatry

## 2019-07-08 DIAGNOSIS — M7752 Other enthesopathy of left foot: Secondary | ICD-10-CM

## 2019-07-08 DIAGNOSIS — M7660 Achilles tendinitis, unspecified leg: Secondary | ICD-10-CM

## 2019-07-08 DIAGNOSIS — M779 Enthesopathy, unspecified: Secondary | ICD-10-CM

## 2019-07-08 DIAGNOSIS — M7662 Achilles tendinitis, left leg: Secondary | ICD-10-CM | POA: Diagnosis not present

## 2019-07-08 DIAGNOSIS — L84 Corns and callosities: Secondary | ICD-10-CM

## 2019-07-08 DIAGNOSIS — Z794 Long term (current) use of insulin: Secondary | ICD-10-CM

## 2019-07-08 DIAGNOSIS — E119 Type 2 diabetes mellitus without complications: Secondary | ICD-10-CM

## 2019-07-08 MED ORDER — DICLOFENAC SODIUM 1 % EX GEL: 2.0000 g | Freq: Four times a day (QID) | CUTANEOUS | 2 refills | Status: DC

## 2019-07-08 MED ORDER — MELOXICAM 15 MG PO TABS: 15.0000 mg | ORAL_TABLET | Freq: Every day | ORAL | 0 refills | Status: DC

## 2019-07-08 NOTE — Patient Instructions (Signed)

## 2019-07-08 NOTE — Progress Notes (Signed)
Subjective: 38 year old female presents the office today for calluses to both of her big toes which are painful.  She also has noticed a "know" on the back of her left ankle which is on about 1 month.  She denies any recent injury or trauma.  It has become tender at times and she has some occasional swelling mostly to the ankle pointing medial ankle.  She said no recent treatment.  No redness or warmth associated with the swelling.  No weakness or falls. Denies any systemic complaints such as fevers, chills, nausea, vomiting. No acute changes since last appointment, and no other complaints at this time.   Objective: AAO x3, NAD DP/PT pulses palpable bilaterally, CRT less than 3 seconds Hyperkeratotic lesions bilateral medial hallux.  No ongoing ulceration drainage or any signs of infection. There is thickening present to the Achilles tendon on the posterior aspect of the ankle.  This is on the mid substance of the Achilles tendon.  Thompson test is negative.  Flexor, extensor tendons appear to be intact however there is some mild swelling along the medial ankle.  MMT 5/5. No open lesions or pre-ulcerative lesions.  No pain with calf compression, swelling, warmth, erythema  Assessment: Hyperkeratotic lesions bilateral hallux with tendinitis left ankle  Plan: -All treatment options discussed with the patient including all alternatives, risks, complications.  -X-rays obtained reviewed.  A skin marker was utilized to identify the area of the soft tissue lesion.  No known ulceration, calcifications present.  No evidence of acute fracture. -Prescribed mobic. Discussed side effects of the medication and directed to stop if any are to occur and call the office.  -Voltaren gel to use as needed as well.  Dispensed heel lift.  Discussed traction, rehab exercises.  Discourse wearing more supportive shoes and inserts. -Debrided the hyperkeratotic lesions x2 without any complications or bleeding. -Patient  encouraged to call the office with any questions, concerns, change in symptoms.   Return in about 6 weeks (around 08/19/2019).   Trula Slade DPM

## 2019-07-11 ENCOUNTER — Encounter: Payer: Self-pay | Admitting: Obstetrics & Gynecology

## 2019-07-11 ENCOUNTER — Other Ambulatory Visit: Payer: Self-pay

## 2019-07-11 ENCOUNTER — Ambulatory Visit (INDEPENDENT_AMBULATORY_CARE_PROVIDER_SITE_OTHER): Payer: Medicaid Other | Admitting: Obstetrics & Gynecology

## 2019-07-11 VITALS — BP 138/82 | HR 101 | Wt 332.3 lb

## 2019-07-11 DIAGNOSIS — D259 Leiomyoma of uterus, unspecified: Secondary | ICD-10-CM | POA: Diagnosis not present

## 2019-07-11 DIAGNOSIS — N920 Excessive and frequent menstruation with regular cycle: Secondary | ICD-10-CM | POA: Diagnosis not present

## 2019-07-11 DIAGNOSIS — Z6841 Body Mass Index (BMI) 40.0 and over, adult: Secondary | ICD-10-CM

## 2019-07-11 MED ORDER — DICLOFENAC SODIUM 50 MG PO TBEC: 50.0000 mg | DELAYED_RELEASE_TABLET | Freq: Three times a day (TID) | ORAL | 3 refills | Status: DC

## 2019-07-11 NOTE — Progress Notes (Signed)
Ultrasounds Results Note  SUBJECTIVE HPI:  [name] is a 38 y.o. [id number] at Unknown by LMP who presents to the [facility] for followup ultrasound results. The patient denies abdominal pain or vaginal bleeding.  She would like to schedule LGIUD for cycle control. [place] was reviewed  Past Medical History:  Diagnosis Date  . Abnormal Pap smear    f/u wnl  . Asthma   . Fibroid   . Gestational diabetes   . Hypertension   . Morbid obesity ([place])   . Sleep apnea   . Urinary tract infection    Past Surgical History:  Procedure Laterality Date  . CESAREAN SECTION    . DILATION AND CURETTAGE OF UTERUS    . TUBAL LIGATION     Social History   Socioeconomic History  . Marital status: Legally Separated    Spouse name: Married  . Number of children: N  . Years of education: Not on file  . Highest education level: Not on file  Occupational History  . Occupation: [employer] REP    Employer: [place]: for a [employer]  . Financial resource strain: Somewhat hard  . Food insecurity    Worry: Never true    Inability: Never true  . Transportation needs    Medical: No    Non-medical: No  Tobacco Use  . Smoking status: Current Some Day Smoker  . Smokeless tobacco: Never Used  Substance and Sexual Activity  . Alcohol use: No    Comment: occasional  . Drug use: No  . Sexual activity: Yes    Birth control/protection: Surgical    Comment: BTL  Lifestyle  . Physical activity    Days per week: 0 days    Minutes per session: 0 min  . Stress: To some extent  Relationships  . Social Herbalist on phone: Not on file    Gets together: Not on file    Attends religious service: Not on file    Active member of club or organization: Not on file    Attends meetings of clubs or organizations: Not on file    Relationship status: Not on file  . Intimate partner violence    Fear of current or ex partner: Not on file     Emotionally abused: Not on file    Physically abused: Not on file    Forced sexual activity: Not on file  Other Topics Concern  . Not on file  Social History Narrative   Currently [redacted] weeks pregnant with first child (boy) Due 04/16/11         Current Outpatient Medications on File Prior to Visit  Medication Sig Dispense Refill  . albuterol (PROVENTIL HFA;VENTOLIN HFA) 108 (90 BASE) MCG/ACT inhaler Inhale 2-3 puffs into the lungs every 6 (six) hours as needed for wheezing. Asthma/SOB    . albuterol (PROVENTIL) (5 MG/ML) 0.5% nebulizer solution Take 0.5 mLs (2.5 mg total) by nebulization every 6 (six) hours as needed for wheezing or shortness of breath. 20 mL 12  . ALPRAZolam (XANAX) 0.5 MG tablet Take 1 tablet (0.5 mg total) by mouth 2 (two) times daily as needed for anxiety or sleep. 30 tablet 0  . atorvastatin (LIPITOR) 10 MG tablet Take 1 tablet (10 mg total) by mouth daily. 90 tablet 0  . Blood Glucose Monitoring Suppl (ACCU-CHEK AVIVA) device USE TO CHECK BLOOD SUGARS UP TO 4 TIMES DAILY  DX CODE:E11.9  1 each 0  . cetirizine (ZYRTEC ALLERGY) 10 MG tablet Take 1 tablet (10 mg total) by mouth daily. 90 tablet 0  . diclofenac (VOLTAREN) 50 MG EC tablet Take 1 tablet (50 mg total) by mouth 3 (three) times daily. 45 tablet 3  . diclofenac Sodium (VOLTAREN) 1 % GEL Apply 2 g topically 4 (four) times daily. Rub into affected area of foot 2 to 4 times daily 100 g 2  . EPINEPHrine (EPIPEN 2-PAK) 0.3 mg/0.3 mL IJ SOAJ injection Inject 0.3 mLs (0.3 mg total) into the muscle once as needed (for severe allergic reaction). CAll 911 immediately if you have to use this medicine 0.3 mL 1  . ferrous sulfate 325 (65 FE) MG EC tablet Take 1 tablet (325 mg total) by mouth 3 (three) times daily with meals. 90 tablet 2  . glucose blood test strip USE TO CHECK BLOOD SUGARS UP TO 4 TIMES DAILY  DX CODE:E11.9 100 each 12  . iron polysaccharides (NIFEREX) 150 MG capsule Take 1 capsule (150 mg total) by mouth daily.  90 capsule 2  . Lancets (ACCU-CHEK MULTICLIX) lancets USE TO CHECK BLOOD SUGARS UP TO 4 TIMES DAILY  DX CODE:E11.9 100 each 12  . losartan-hydrochlorothiazide (HYZAAR) 50-12.5 MG tablet Take 1 tablet by mouth daily. 30 tablet 0  . meloxicam (MOBIC) 15 MG tablet Take 1 tablet (15 mg total) by mouth daily. 30 tablet 0  . metFORMIN (GLUCOPHAGE) 1000 MG tablet Take 1 tablet (1,000 mg total) by mouth 2 (two) times daily with a meal. 180 tablet 1  . Semaglutide,0.25 or 0.5MG /DOS, (OZEMPIC, 0.25 OR 0.5 MG/DOSE,) 2 MG/1.5ML SOPN Inject 0.5 mg into the skin once a week. 1 pen 3   No current facility-administered medications on file prior to visit.    Allergies  Allergen Reactions  . Other Anaphylaxis and Other (See Comments)    Pt has fish allergy Pt has allergy to butter cookies  . Penicillins Rash and Other (See Comments)    Told MD that PCN reaction is rash and that she has taken amoxicillin before    I have reviewed patient's Past Medical Hx, Surgical Hx, Family Hx, Social Hx, medications and allergies.   Review of Systems  Genitourinary: Positive for menstrual problem.   Review of Systems  Constitutional: Negative for fever and chills.  Gastrointestinal: Negative for nausea, vomiting, abdominal pain, diarrhea and constipation.  Genitourinary: Negative for dysuria.  Musculoskeletal: Negative for back pain.  Neurological: Negative for dizziness and weakness.    Physical Exam  BP 138/82   Pulse (!) 101   Wt (!) 332 lb 4.8 oz (150.7 kg)   LMP 07/04/2019   BMI 56.68 kg/m   GENERAL: Well-developed, well-nourished female in no acute distress.  HEENT: Normocephalic, atraumatic.   LUNGS: Effort normal ABDOMEN: soft, non-tender HEART: Regular rate  SKIN: Warm, dry and without erythema PSYCH: Normal mood and affect NEURO: Alert and oriented x 4  LAB RESULTS No results found for this or any previous visit (from the past 24 hour(s)).  IMAGING Dg Ankle Complete Left  Result Date:  07/08/2019 Please see detailed radiograph report in office note.  US Pelvic Complete With Transvaginal  Result Date: 06/22/2019 CLINICAL DATA:  Abnormal uterine bleeding, history of fibroid, tubal ligation, Caesarean section, LMP 06/09/2019 EXAM: TRANSABDOMINAL AND TRANSVAGINAL ULTRASOUND OF PELVIS TECHNIQUE: Both transabdominal and transvaginal ultrasound examinations of the pelvis were performed. Transabdominal technique was performed for global imaging of the pelvis including uterus, ovaries, adnexal regions, and pelvic  cul-de-sac. It was necessary to proceed with endovaginal exam following the transabdominal exam to visualize the lower uterine segment and ovaries. Transvaginal imaging in the transverse plane was limited due to patient discomfort. COMPARISON:  02/02/2017 FINDINGS: Uterus Measurements: 12.1 x 5.9 x 8.8 cm = volume: 327 mL. Anteverted. Nabothian cysts at cervix. Exophytic partially calcified leiomyoma at uterine fundus 3.0 x 2.4 x 3.0 cm. No additional masses. Endometrium Thickness: 11 mm.  No endometrial fluid or focal abnormality Right ovary Not visualized on either transabdominal or endovaginal imaging, likely obscured by bowel Left ovary Measurements: 3.4 x 1.8 x 1.7 cm = volume: 5.4 mL. Normal morphology without mass Other findings No free pelvic fluid or adnexal masses. IMPRESSION: Nonvisualization of RIGHT ovary. Exophytic partially calcified leiomyoma 3.0 cm diameter at uterine fundus. Remainder of exam unremarkable, with a normal appearing endometrial complex 11 mm thick. If bleeding remains unresponsive to hormonal or medical therapy, sonohysterogram should be considered for focal lesion work-up. (Ref: Radiological Reasoning: Algorithmic Workup of Abnormal Vaginal Bleeding with Endovaginal Sonography and Sonohysterography. [id number] [id number]) Electronically Signed   By: Lavonia Dana M.D.   On: 06/22/2019 10:35    ASSESSMENT Uterine leiomyoma, unspecified location  Class 3  severe obesity due to excess calories with serious comorbidity and body mass index (BMI) of 50.0 to 59.9 in adult [facility])  Menorrhagia with regular cycle   PLAN Discharge home in stable condition She will schedule Liletta insertion and information was given  Woodroe Mode, MD  07/11/2019  10:35 AM

## 2019-07-11 NOTE — Patient Instructions (Signed)
Levonorgestrel intrauterine device (IUD) What is this medicine? LEVONORGESTREL IUD (LEE voe nor jes trel) is a contraceptive (birth control) device. The device is placed inside the uterus by a healthcare professional. It is used to prevent pregnancy. This device can also be used to treat heavy bleeding that occurs during your period. This medicine may be used for other purposes; ask your health care provider or pharmacist if you have questions. COMMON BRAND NAME(S): Kyleena, LILETTA, Mirena, Skyla What should I tell my health care provider before I take this medicine? They need to know if you have any of these conditions:  abnormal Pap smear  cancer of the breast, uterus, or cervix  diabetes  endometritis  genital or pelvic infection now or in the past  have more than one sexual partner or your partner has more than one partner  heart disease  history of an ectopic or tubal pregnancy  immune system problems  IUD in place  liver disease or tumor  problems with blood clots or take blood-thinners  seizures  use intravenous drugs  uterus of unusual shape  vaginal bleeding that has not been explained  an unusual or allergic reaction to levonorgestrel, other hormones, silicone, or polyethylene, medicines, foods, dyes, or preservatives  pregnant or trying to get pregnant  breast-feeding How should I use this medicine? This device is placed inside the uterus by a health care professional. Talk to your pediatrician regarding the use of this medicine in children. Special care may be needed. Overdosage: If you think you have taken too much of this medicine contact a poison control center or emergency room at once. NOTE: This medicine is only for you. Do not share this medicine with others. What if I miss a dose? This does not apply. Depending on the brand of device you have inserted, the device will need to be replaced every 3 to 6 years if you wish to continue using this type  of birth control. What may interact with this medicine? Do not take this medicine with any of the following medications:  amprenavir  bosentan  fosamprenavir This medicine may also interact with the following medications:  aprepitant  armodafinil  barbiturate medicines for inducing sleep or treating seizures  bexarotene  boceprevir  griseofulvin  medicines to treat seizures like carbamazepine, ethotoin, felbamate, oxcarbazepine, phenytoin, topiramate  modafinil  pioglitazone  rifabutin  rifampin  rifapentine  some medicines to treat HIV infection like atazanavir, efavirenz, indinavir, lopinavir, nelfinavir, tipranavir, ritonavir  St. John's wort  warfarin This list may not describe all possible interactions. Give your health care provider a list of all the medicines, herbs, non-prescription drugs, or dietary supplements you use. Also tell them if you smoke, drink alcohol, or use illegal drugs. Some items may interact with your medicine. What should I watch for while using this medicine? Visit your doctor or health care professional for regular check ups. See your doctor if you or your partner has sexual contact with others, becomes HIV positive, or gets a sexual transmitted disease. This product does not protect you against HIV infection (AIDS) or other sexually transmitted diseases. You can check the placement of the IUD yourself by reaching up to the top of your vagina with clean fingers to feel the threads. Do not pull on the threads. It is a good habit to check placement after each menstrual period. Call your doctor right away if you feel more of the IUD than just the threads or if you cannot feel the threads at   all. The IUD may come out by itself. You may become pregnant if the device comes out. If you notice that the IUD has come out use a backup birth control method like condoms and call your health care provider. Using tampons will not change the position of the  IUD and are okay to use during your period. This IUD can be safely scanned with magnetic resonance imaging (MRI) only under specific conditions. Before you have an MRI, tell your healthcare provider that you have an IUD in place, and which type of IUD you have in place. What side effects may I notice from receiving this medicine? Side effects that you should report to your doctor or health care professional as soon as possible:  allergic reactions like skin rash, itching or hives, swelling of the face, lips, or tongue  fever, flu-like symptoms  genital sores  high blood pressure  no menstrual period for 6 weeks during use  pain, swelling, warmth in the leg  pelvic pain or tenderness  severe or sudden headache  signs of pregnancy  stomach cramping  sudden shortness of breath  trouble with balance, talking, or walking  unusual vaginal bleeding, discharge  yellowing of the eyes or skin Side effects that usually do not require medical attention (report to your doctor or health care professional if they continue or are bothersome):  acne  breast pain  change in sex drive or performance  changes in weight  cramping, dizziness, or faintness while the device is being inserted  headache  irregular menstrual bleeding within first 3 to 6 months of use  nausea This list may not describe all possible side effects. Call your doctor for medical advice about side effects. You may report side effects to FDA at 1-800-FDA-1088. Where should I keep my medicine? This does not apply. NOTE: This sheet is a summary. It may not cover all possible information. If you have questions about this medicine, talk to your doctor, pharmacist, or health care provider.  2020 Elsevier/Gold Standard (2018-06-01 13:22:01)  

## 2019-07-21 ENCOUNTER — Ambulatory Visit: Payer: Self-pay

## 2019-07-21 DIAGNOSIS — I10 Essential (primary) hypertension: Secondary | ICD-10-CM

## 2019-07-21 NOTE — Chronic Care Management (AMB) (Signed)
Chronic Care Management    Social Work Follow Up Note  07/21/2019 Name: [name] MRN: [id number] DOB: 05-05-1981  [name] is a 38 y.o. year old female who is a primary care patient of Minette Brine, [place]. The CCM team was consulted for assistance with care coordination.   Review of patient status, including review of consultants reports, other relevant assessments, and collaboration with appropriate care team members and the patient's provider was performed as part of comprehensive patient evaluation and provision of chronic care management services.    SW placed an outbound call to the patient to assess progression of patient stated goals and assist with care coordination needs.  Outpatient Encounter Medications as of 07/21/2019  Medication Sig  . albuterol (PROVENTIL HFA;VENTOLIN HFA) 108 (90 BASE) MCG/ACT inhaler Inhale 2-3 puffs into the lungs every 6 (six) hours as needed for wheezing. Asthma/SOB  . albuterol (PROVENTIL) (5 MG/ML) 0.5% nebulizer solution Take 0.5 mLs (2.5 mg total) by nebulization every 6 (six) hours as needed for wheezing or shortness of breath.  . ALPRAZolam (XANAX) 0.5 MG tablet Take 1 tablet (0.5 mg total) by mouth 2 (two) times daily as needed for anxiety or sleep.  [name] atorvastatin (LIPITOR) 10 MG tablet Take 1 tablet (10 mg total) by mouth daily.  . Blood Glucose Monitoring Suppl (ACCU-CHEK AVIVA) device USE TO CHECK BLOOD SUGARS UP TO 4 TIMES DAILY  DX CODE:E11.9  . cetirizine (ZYRTEC ALLERGY) 10 MG tablet Take 1 tablet (10 mg total) by mouth daily.  . diclofenac (VOLTAREN) 50 MG EC tablet Take 1 tablet (50 mg total) by mouth 3 (three) times daily.  . diclofenac Sodium (VOLTAREN) 1 % GEL Apply 2 g topically 4 (four) times daily. Rub into affected area of foot 2 to 4 times daily  . EPINEPHrine (EPIPEN 2-PAK) 0.3 mg/0.3 mL IJ SOAJ injection Inject 0.3 mLs (0.3 mg total) into the muscle once as needed (for severe allergic reaction). CAll 911 immediately if  you have to use this medicine  . ferrous sulfate 325 (65 FE) MG EC tablet Take 1 tablet (325 mg total) by mouth 3 (three) times daily with meals.  [name] glucose blood test strip USE TO CHECK BLOOD SUGARS UP TO 4 TIMES DAILY  DX CODE:E11.9  . iron polysaccharides (NIFEREX) 150 MG capsule Take 1 capsule (150 mg total) by mouth daily.  . Lancets (ACCU-CHEK MULTICLIX) lancets USE TO CHECK BLOOD SUGARS UP TO 4 TIMES DAILY  DX CODE:E11.9  . losartan-hydrochlorothiazide (HYZAAR) 50-12.5 MG tablet Take 1 tablet by mouth daily.  . meloxicam (MOBIC) 15 MG tablet Take 1 tablet (15 mg total) by mouth daily.  . metFORMIN (GLUCOPHAGE) 1000 MG tablet Take 1 tablet (1,000 mg total) by mouth 2 (two) times daily with a meal.  . Semaglutide,0.25 or 0.5MG/DOS, (OZEMPIC, 0.25 OR 0.5 MG/DOSE,) 2 MG/1.5ML SOPN Inject 0.5 mg into the skin once a week.   No facility-administered encounter medications on file as of 07/21/2019.     Goals Addressed            This Visit's Progress     Patient Stated   . COMPLETED: "I don't have a blood pressure cuff" (pt-stated)       Current Barriers:  . Financial constraints related to cost of equipment . Limited knowledge of how to obtain equipment under insurance plan  Social Work Clinical Goal(s):  [name] Over the next 20 days the patient will follow up with her neighborhood pharmacy to obtain a blood  pressure cuff as instructed by SW completed/ not met due to insurance restrictions . 06/21/2019 New: Over the next 15 days the patient will work with CM team to obtain an automatic blood pressure kit under her Medicaid benefit  CCM SW Interventions: Completed 12/172020 . Outbound call to the patient to assess progression of patient goal . Determined the patient has obtained a BP cuff and is monitoring daily . Goal Met  Patient Self Care Activities:  . Self administers medications as prescribed . Calls pharmacy for medication refills . Calls provider office for new concerns or  questions  Please see past updates related to this goal by clicking on the "Past Updates" button in the selected goal          Follow Up Plan: No further SW follow up planned at this time. The patient will remain active with RN Case Manager and embedded PharmD.  [name], BSW, CDP Social Worker, Certified Dementia Practitioner [place] [facility] [phone]

## 2019-07-21 NOTE — Patient Instructions (Signed)
Visit Information  Goals Addressed            This Visit's Progress     Patient Stated   . COMPLETED: "I don't have a blood pressure cuff" (pt-stated)       Current Barriers:  . Financial constraints related to cost of equipment . Limited knowledge of how to obtain equipment under insurance plan  Social Work Clinical Goal(s):  [name] Over the next 20 days the patient will follow up with her neighborhood pharmacy to obtain a blood pressure cuff as instructed by SW completed/ not met due to insurance restrictions . 06/21/2019 New: Over the next 15 days the patient will work with CM team to obtain an automatic blood pressure kit under her Medicaid benefit  CCM SW Interventions: Completed 12/172020 . Outbound call to the patient to assess progression of patient goal . Determined the patient has obtained a BP cuff and is monitoring daily . Goal Met  Patient Self Care Activities:  . Self administers medications as prescribed . Calls pharmacy for medication refills . Calls provider office for new concerns or questions  Please see past updates related to this goal by clicking on the "Past Updates" button in the selected goal         Follow up plan: No SW follow up planned at this time. Please contact me with future resource needs.  [name], BSW, CDP Social Worker, Certified Dementia Practitioner [place] [facility] [phone]

## 2019-07-24 ENCOUNTER — Encounter: Payer: Self-pay | Admitting: Nurse Practitioner

## 2019-07-24 NOTE — Progress Notes (Signed)
  This encounter was created in error - please disregard. Patient was not seen

## 2019-07-25 ENCOUNTER — Telehealth: Payer: Self-pay

## 2019-07-26 ENCOUNTER — Other Ambulatory Visit: Payer: Self-pay

## 2019-07-26 ENCOUNTER — Ambulatory Visit: Payer: Self-pay

## 2019-07-26 DIAGNOSIS — E1165 Type 2 diabetes mellitus with hyperglycemia: Secondary | ICD-10-CM

## 2019-07-26 DIAGNOSIS — Z6841 Body Mass Index (BMI) 40.0 and over, adult: Secondary | ICD-10-CM

## 2019-07-26 DIAGNOSIS — I10 Essential (primary) hypertension: Secondary | ICD-10-CM

## 2019-07-26 MED ORDER — OZEMPIC (0.25 OR 0.5 MG/DOSE) 2 MG/1.5ML ~~LOC~~ SOPN: 0.5000 mg | PEN_INJECTOR | SUBCUTANEOUS | 3 refills | Status: DC

## 2019-07-26 MED ORDER — LOSARTAN POTASSIUM-HCTZ 50-12.5 MG PO TABS: 1.0000 | ORAL_TABLET | Freq: Every day | ORAL | 0 refills | Status: DC

## 2019-07-27 NOTE — Chronic Care Management (AMB) (Signed)
Care Management   Follow Up Note   07/26/2019 Name: [name] MRN: [id number] DOB: 15-Aug-1980  Referred by: Stephanie Brine, FNP Reason for referral : Care Coordination ([place] )   [name] is a 38 y.o. year old female who is a primary care patient of [name], [place]. The CCM team was consulted for assistance with chronic disease management and care coordination needs.    Review of patient status, including review of consultants reports, relevant laboratory and other test results, and collaboration with appropriate care team members and the patient's provider was performed as part of comprehensive patient evaluation and provision of chronic care management services.    SDOH (Social Determinants of Health) screening performed today: None. See Care Plan for related entries.   Placed outbound call to patient for a Care Management RN CM follow up.   Outpatient Encounter Medications as of 07/26/2019  Medication Sig  . albuterol (PROVENTIL HFA;VENTOLIN HFA) 108 (90 BASE) MCG/ACT inhaler Inhale 2-3 puffs into the lungs every 6 (six) hours as needed for wheezing. Asthma/SOB  . albuterol (PROVENTIL) (5 MG/ML) 0.5% nebulizer solution Take 0.5 mLs (2.5 mg total) by nebulization every 6 (six) hours as needed for wheezing or shortness of breath.  . ALPRAZolam (XANAX) 0.5 MG tablet Take 1 tablet (0.5 mg total) by mouth 2 (two) times daily as needed for anxiety or sleep.  [name] atorvastatin (LIPITOR) 10 MG tablet Take 1 tablet (10 mg total) by mouth daily.  . Blood Glucose Monitoring Suppl (ACCU-CHEK AVIVA) device USE TO CHECK BLOOD SUGARS UP TO 4 TIMES DAILY  DX CODE:E11.9  . cetirizine (ZYRTEC ALLERGY) 10 MG tablet Take 1 tablet (10 mg total) by mouth daily.  . diclofenac (VOLTAREN) 50 MG EC tablet Take 1 tablet (50 mg total) by mouth 3 (three) times daily.  . diclofenac Sodium (VOLTAREN) 1 % GEL Apply 2 g topically 4 (four) times daily. Rub into affected area of foot 2  to 4 times daily  . EPINEPHrine (EPIPEN 2-PAK) 0.3 mg/0.3 mL IJ SOAJ injection Inject 0.3 mLs (0.3 mg total) into the muscle once as needed (for severe allergic reaction). CAll 911 immediately if you have to use this medicine  . ferrous sulfate 325 (65 FE) MG EC tablet Take 1 tablet (325 mg total) by mouth 3 (three) times daily with meals.  [name] glucose blood test strip USE TO CHECK BLOOD SUGARS UP TO 4 TIMES DAILY  DX CODE:E11.9  . iron polysaccharides (NIFEREX) 150 MG capsule Take 1 capsule (150 mg total) by mouth daily.  . Lancets (ACCU-CHEK MULTICLIX) lancets USE TO CHECK BLOOD SUGARS UP TO 4 TIMES DAILY  DX CODE:E11.9  . losartan-hydrochlorothiazide (HYZAAR) 50-12.5 MG tablet Take 1 tablet by mouth daily.  . meloxicam (MOBIC) 15 MG tablet Take 1 tablet (15 mg total) by mouth daily.  . metFORMIN (GLUCOPHAGE) 1000 MG tablet Take 1 tablet (1,000 mg total) by mouth 2 (two) times daily with a meal.  . Semaglutide,0.25 or 0.5MG/DOS, (OZEMPIC, 0.25 OR 0.5 MG/DOSE,) 2 MG/1.5ML SOPN Inject 0.5 mg into the skin once a week.  . [DISCONTINUED] losartan-hydrochlorothiazide (HYZAAR) 50-12.5 MG tablet Take 1 tablet by mouth daily.  . [DISCONTINUED] Semaglutide,0.25 or 0.5MG/DOS, (OZEMPIC, 0.25 OR 0.5 MG/DOSE,) 2 MG/1.5ML SOPN Inject 0.5 mg into the skin once a week.   No facility-administered encounter medications on file as of 07/26/2019.     Goals Addressed      Patient Stated   . "I am taking my  iron but my iron is still low" (pt-stated)       Current Barriers:  [name] Knowledge Deficits related to diagnosis and treatment management for Anemia  Nurse Case Manager Clinical Goal(s):  [name] Over the next 30 days, patient will work with the CCM team and PCP provider to address needs related to improved treatment management of Anemia  Goal Met . Over the next 90 days, patient will increase her Iron Saturation and Ferritin levels to a normal range of 15  CC RN CM Interventions:  04/22/19 call completed with  patient   . Evaluation of current treatment plan related to Anemia and patient's adherence to plan as established by provider. . Reviewed medications with patient and discussed patient is adhering to taking her prescribed medication, Ferrous Sulfate 325 (65 Fe) mg tid as directed by PCP - patient is tolerating well and denies missed doses . Discussed plans with patient for ongoing care management follow up and provided patient with direct contact information for care management team . Discussed next PCP OV is scheduled with PCP provider Stephanie Brine, FNP scheduled for 08/24/19 '@11' :00 AM   Patient Self Care Activities:  . Self administers medications as prescribed . Attends all scheduled provider appointments . Calls pharmacy for medication refills . Attends church or other social activities . Performs ADL's independently . Performs IADL's independently . Calls provider office for new concerns or questions  Please see past updates related to this goal by clicking on the "Past Updates" button in the selected goal     . "I want to weigh less than 300 pounds by the end of the year" (pt-stated)       Current Barriers:  . Lacks knowledge of safe diet and exercise conducive to weight loss that accomodates health conditions . Increased stress . COVID 19 causing changes to current routines with children  Clinical Social Work Clinical Goal(s):  . New - 07/26/19 - Over the next 90 days the patient will work with the [facility] team to address weight loss goal (goal date extended due to txt delays related to COVID-19)  CCM RN CM Interventions: Completed 04/27/2019 . Assessed for progress towards meeting her weight loss goal . Discussed patient has made efforts to improve her eating habits and feels she is on the right track . Determined patient has not lost weight as of yet but is determined to continue these efforts; discussed she has also been  more active since receiving talk therapy from [facility] and is feeling more energetic  Patient Self Care Activities:  . Attends all scheduled provider appointments . Performs ADL's independently . Calls provider office for new concerns or questions  Please see past updates related to this goal by clicking on the "Past Updates" button in the selected goal     . "I would like to better manage my diabetes" (pt-stated)       Current Barriers:  [name] Knowledge Deficits related to diabetes disease process and Self health management  . Non-adherence to Self monitoring CBG's   Nurse Case Manager Clinical Goal(s):  [name] Over the next 30 days, patient will work with the [facility] team to address needs related to diabetes Self Health management to help lower her A1C - Goal Met . Over the next 60 days, patient will demonstrate improved adherence to prescribed treatment plan for DMII as evidenced bypatient will verbalize adherence to Self monitoring her CBG daily, taking her diabetic medications exactly as prescribed w/o missed doses and adhering  to her ADA diet as recommended - Goal Met . Over the next 90 days, patient will maintain or lower her current A1C of 8.4 - Goal Not Met  . 07/26/19 - New - Over the next 90 days, patient will report persistent daily glycemic control within target range of 80-130 (5 out of 7 days per week)  CC RN CM Interventions:  07/26/19 call completed with patient   . Evaluation of current treatment plan related to Diabetes and patient's adherence to plan as established by provider. . Provided education to patient re: current A1C increased from 8.4 to 8.9; discussed target A1C of <7.0; reinforced basic disease process of diabetes; reinforced importance of strictly following an ADA diet, taking anti-glycemic medications exactly as prescribed and implementing daily exercise at least a minimal 150 minutes/week minimally as recommended by ADA  . Reviewed medications with patient and discussed patients prescribed diabetic regimen,  Metformin 1000 mg bid meals; Ozempic 2 mg/1.5 mL, take 0.5 mg weekly injection . Collaborated with embedded Pharm D [name] regarding patient requested additional samples of Ozempic due to  having low supply . Discussed plans with patient for ongoing care management follow up and provided patient with direct contact information for care management team . Advised patient, providing education and rationale, to check cbg before meals and record, calling the CCM team and or MD for findings outside established parameters.  <80 or >250  Patient Self Care Activities:  . Self administers medications as prescribed . Attends all scheduled provider appointments . Calls pharmacy for medication refills . Attends church or other social activities . Performs ADL's independently . Performs IADL's independently . Calls provider office for new concerns or questions  Please see past updates related to this goal by clicking on the "Past Updates" button in the selected goal     . "to keep my BP better controlled" (pt-stated)       Current Barriers:  [name] Knowledge Deficits related to Hypertension  . Chronic Disease Management support and education needs related to Hypertension   Nurse Case Manager Clinical Goal(s):  [name] Over the next 90 days, patient will demonstrate improved adherence to prescribed treatment plan for Hypertension as evidenced bypatient will adhere to taking all medications w/o missed doses and will continue to Self monitor her BP at home, reporting abnormal readings to the CCM team and or PCP provider   CCM RN CM Interventions:  07/26/19 call completed with patient  . Evaluation of current treatment plan related to Hypertension and patient's adherence to plan as established by provider. . Provided education to patient re: target BP 130/80; discussed patient has purchased a home BP cuff and is Self monitoring her BP; last reported BP 140/80 . Reviewed medications with patient and discussed  patient is adhering to taking her Hyzaar 50-12.5 mg every day, she denies missed doses . Discussed plans with patient for ongoing care management follow up and provided patient with direct contact information for care management team . Advised patient, providing education and rationale, to monitor blood pressure daily and record, calling the CCM team and or PCP provider  for findings outside established parameters.  . Provided patient with printed educational materials related to What Is High Blood Pressure; African-Americans and High Blood Pressure; Why Should I Restrict Sodium; Life's Simple 7  Patient Self Care Activities:  . Self administers medications as prescribed . Attends all scheduled provider appointments . Calls pharmacy for medication refills . Performs ADL's independently . Performs IADL's independently .  Calls provider office for new concerns or questions  Initial goal documentation        Telephone follow up appointment with care management team member scheduled for: 09/09/19   Barb Merino, RN, BSN, CCM Care Management Coordinator [facility]  Direct Phone: [phone]

## 2019-07-27 NOTE — Patient Instructions (Signed)
Visit Information  Goals Addressed      Patient Stated   . "I am taking my iron but my iron is still low" (pt-stated)       Current Barriers:  [name] Knowledge Deficits related to diagnosis and treatment management for Anemia  Nurse Case Manager Clinical Goal(s):  [name] Over the next 30 days, patient will work with the CCM team and PCP provider to address needs related to improved treatment management of Anemia  Goal Met . Over the next 90 days, patient will increase her Iron Saturation and Ferritin levels to a normal range of 15  CC RN CM Interventions:  04/22/19 call completed with patient   . Evaluation of current treatment plan related to Anemia and patient's adherence to plan as established by provider. . Reviewed medications with patient and discussed patient is adhering to taking her prescribed medication, Ferrous Sulfate 325 (65 Fe) mg tid as directed by PCP - patient is tolerating well and denies missed doses . Discussed plans with patient for ongoing care management follow up and provided patient with direct contact information for care management team . Discussed next PCP OV is scheduled with PCP provider Stephanie Brine, FNP scheduled for 08/24/19 '@11' :00 AM   Patient Self Care Activities:  . Self administers medications as prescribed . Attends all scheduled provider appointments . Calls pharmacy for medication refills . Attends church or other social activities . Performs ADL's independently . Performs IADL's independently . Calls provider office for new concerns or questions  Please see past updates related to this goal by clicking on the "Past Updates" button in the selected goal     . "I want to weigh less than 300 pounds by the end of the year" (pt-stated)       Current Barriers:  . Lacks knowledge of safe diet and exercise conducive to weight loss that accomodates health conditions . Increased stress . COVID 19 causing changes to current routines with children  Clinical  Social Work Clinical Goal(s):  . New - 07/26/19 - Over the next 90 days the patient will work with the [facility] team to address weight loss goal (goal date extended due to txt delays related to COVID-19)  CCM RN CM Interventions: Completed 04/27/2019 . Assessed for progress towards meeting her weight loss goal . Discussed patient has made efforts to improve her eating habits and feels she is on the right track . Determined patient has not lost weight as of yet but is determined to continue these efforts; discussed she has also been  more active since receiving talk therapy from [facility] and is feeling more energetic  Patient Self Care Activities:  . Attends all scheduled provider appointments . Performs ADL's independently . Calls provider office for new concerns or questions  Please see past updates related to this goal by clicking on the "Past Updates" button in the selected goal     . "I would like to better manage my diabetes" (pt-stated)       Current Barriers:  [name] Knowledge Deficits related to diabetes disease process and Self health management  . Non-adherence to Self monitoring CBG's   Nurse Case Manager Clinical Goal(s):  [name] Over the next 30 days, patient will work with the [facility] team to address needs related to diabetes Self Health management to help lower her A1C - Goal Met . Over the next 60 days, patient will demonstrate improved adherence to prescribed treatment plan for DMII as evidenced bypatient will verbalize adherence  to Self monitoring her CBG daily, taking her diabetic medications exactly as prescribed w/o missed doses and adhering to her ADA diet as recommended - Goal Met . Over the next 90 days, patient will maintain or lower her current A1C of 8.4 - Goal Not Met  . 07/26/19 - New - Over the next 90 days, patient will report persistent daily glycemic control within target range of 80-130 (5 out of 7 days per week)  CC RN CM Interventions:  07/26/19 call  completed with patient   . Evaluation of current treatment plan related to Diabetes and patient's adherence to plan as established by provider. . Provided education to patient re: current A1C increased from 8.4 to 8.9; discussed target A1C of <7.0; reinforced basic disease process of diabetes; reinforced importance of strictly following an ADA diet, taking anti-glycemic medications exactly as prescribed and implementing daily exercise at least a minimal 150 minutes/week minimally as recommended by ADA  . Reviewed medications with patient and discussed patients prescribed diabetic regimen, Metformin 1000 mg bid meals; Ozempic 2 mg/1.5 mL, take 0.5 mg weekly injection . Collaborated with embedded Pharm D [name] regarding patient requested additional samples of Ozempic due to  having low supply . Discussed plans with patient for ongoing care management follow up and provided patient with direct contact information for care management team . Advised patient, providing education and rationale, to check cbg before meals and record, calling the CCM team and or MD for findings outside established parameters.  <80 or >250  Patient Self Care Activities:  . Self administers medications as prescribed . Attends all scheduled provider appointments . Calls pharmacy for medication refills . Attends church or other social activities . Performs ADL's independently . Performs IADL's independently . Calls provider office for new concerns or questions  Please see past updates related to this goal by clicking on the "Past Updates" button in the selected goal     . "to keep my BP better controlled" (pt-stated)       Current Barriers:  [name] Knowledge Deficits related to Hypertension  . Chronic Disease Management support and education needs related to Hypertension   Nurse Case Manager Clinical Goal(s):  [name] Over the next 90 days, patient will demonstrate improved adherence to prescribed treatment plan for  Hypertension as evidenced bypatient will adhere to taking all medications w/o missed doses and will continue to Self monitor her BP at home, reporting abnormal readings to the CCM team and or PCP provider   CCM RN CM Interventions:  07/26/19 call completed with patient  . Evaluation of current treatment plan related to Hypertension and patient's adherence to plan as established by provider. . Provided education to patient re: target BP 130/80; discussed patient has purchased a home BP cuff and is Self monitoring her BP; last reported BP 140/80 . Reviewed medications with patient and discussed patient is adhering to taking her Hyzaar 50-12.5 mg every day, she denies missed doses . Discussed plans with patient for ongoing care management follow up and provided patient with direct contact information for care management team . Advised patient, providing education and rationale, to monitor blood pressure daily and record, calling the CCM team and or PCP provider  for findings outside established parameters.  . Provided patient with printed educational materials related to What Is High Blood Pressure; African-Americans and High Blood Pressure; Why Should I Restrict Sodium; Life's Simple 7  Patient Self Care Activities:  . Self administers medications as prescribed . Attends all  scheduled provider appointments . Calls pharmacy for medication refills . Performs ADL's independently . Performs IADL's independently . Calls provider office for new concerns or questions  Initial goal documentation       The patient verbalized understanding of instructions provided today and declined a print copy of patient instruction materials.   Telephone follow up appointment with care management team member scheduled for: 09/09/19  Barb Merino, RN, BSN, CCM Care Management Coordinator [facility]  Direct Phone: [phone]

## 2019-08-08 ENCOUNTER — Telehealth: Payer: Self-pay | Admitting: Family Medicine

## 2019-08-08 NOTE — Telephone Encounter (Signed)
The patient called in stating she would like to verify what birth control method was going to place if it was going to be plastic or metal. Informed the patient a nurse will call back with the information. She also had questions about the process.

## 2019-08-08 NOTE — Telephone Encounter (Signed)
Had questions about IUD, explained that it was the plastic one and explained the process of insertion. Patient verbalized understanding and ended the call

## 2019-08-18 ENCOUNTER — Encounter: Payer: Self-pay | Admitting: Podiatry

## 2019-08-18 ENCOUNTER — Other Ambulatory Visit: Payer: Self-pay

## 2019-08-18 ENCOUNTER — Ambulatory Visit: Payer: Medicaid Other | Admitting: Podiatry

## 2019-08-18 DIAGNOSIS — Z794 Long term (current) use of insulin: Secondary | ICD-10-CM | POA: Diagnosis not present

## 2019-08-18 DIAGNOSIS — M7662 Achilles tendinitis, left leg: Secondary | ICD-10-CM | POA: Diagnosis not present

## 2019-08-18 DIAGNOSIS — E119 Type 2 diabetes mellitus without complications: Secondary | ICD-10-CM | POA: Diagnosis not present

## 2019-08-22 ENCOUNTER — Encounter: Payer: Self-pay | Admitting: Nurse Practitioner

## 2019-08-22 ENCOUNTER — Other Ambulatory Visit: Payer: Self-pay

## 2019-08-22 ENCOUNTER — Ambulatory Visit (INDEPENDENT_AMBULATORY_CARE_PROVIDER_SITE_OTHER): Payer: Medicaid Other | Admitting: Nurse Practitioner

## 2019-08-22 VITALS — BP 133/87 | HR 84 | Ht 66.0 in | Wt 336.3 lb

## 2019-08-22 DIAGNOSIS — Z30430 Encounter for insertion of intrauterine contraceptive device: Secondary | ICD-10-CM

## 2019-08-22 MED ORDER — LEVONORGESTREL 19.5 MCG/DAY IU IUD: INTRAUTERINE_SYSTEM | Freq: Once | INTRAUTERINE | Status: AC

## 2019-08-22 NOTE — Progress Notes (Signed)
[name] 39 y.o. Scheduled for IUD placement at visit with Dr. Roselie Awkward.  IUD with hormone will be used to help control irregular vaginal bleeding.  Has history of BTL.   IUD Insertion Procedure Note Patient identified, informed consent performed, consent signed.   Discussed risks of irregular bleeding, cramping, infection, malpositioning or misplacement of the IUD outside the uterus which may require further procedure such as laparoscopy. Time out was performed.   Speculum placed in the vagina.  Cervix visualized.  Cleaned with Betadine x 2.  Hurricane spray used.  Grasped anteriorly with a single tooth tenaculum.  Uterus sounded to 10 cm.  Liletta  IUD placed per manufacturer's recommendations.  Strings trimmed to 3 cm. Tenaculum was removed, good hemostasis noted.  Patient tolerated procedure well.   Patient was given post-procedure instructions.   Patient was also asked to check IUD strings periodically and follow up in 4 weeks for IUD check.  Earlie Server, RN, MSN, NP-BC Nurse Practitioner, [facility] for [employer], [facility] 08/22/2019 1:19 PM

## 2019-08-23 DIAGNOSIS — F411 Generalized anxiety disorder: Secondary | ICD-10-CM | POA: Diagnosis not present

## 2019-08-23 DIAGNOSIS — F332 Major depressive disorder, recurrent severe without psychotic features: Secondary | ICD-10-CM | POA: Diagnosis not present

## 2019-08-24 ENCOUNTER — Encounter: Payer: Self-pay | Admitting: Nurse Practitioner

## 2019-08-24 ENCOUNTER — Other Ambulatory Visit: Payer: Self-pay

## 2019-08-24 ENCOUNTER — Ambulatory Visit: Payer: Medicaid Other | Admitting: Nurse Practitioner

## 2019-08-24 VITALS — BP 122/80 | HR 96 | Temp 98.5°F | Ht 64.2 in | Wt 335.4 lb

## 2019-08-24 DIAGNOSIS — E782 Mixed hyperlipidemia: Secondary | ICD-10-CM

## 2019-08-24 DIAGNOSIS — F419 Anxiety disorder, unspecified: Secondary | ICD-10-CM

## 2019-08-24 DIAGNOSIS — E1165 Type 2 diabetes mellitus with hyperglycemia: Secondary | ICD-10-CM

## 2019-08-24 DIAGNOSIS — Z6841 Body Mass Index (BMI) 40.0 and over, adult: Secondary | ICD-10-CM | POA: Diagnosis not present

## 2019-08-24 DIAGNOSIS — Z23 Encounter for immunization: Secondary | ICD-10-CM | POA: Diagnosis not present

## 2019-08-24 DIAGNOSIS — Z139 Encounter for screening, unspecified: Secondary | ICD-10-CM

## 2019-08-24 DIAGNOSIS — I10 Essential (primary) hypertension: Secondary | ICD-10-CM

## 2019-08-24 MED ORDER — TETANUS-DIPHTH-ACELL PERTUSSIS 5-2.5-18.5 LF-MCG/0.5 IM SUSP: 0.5000 mL | Freq: Once | INTRAMUSCULAR | Status: DC

## 2019-08-24 NOTE — Progress Notes (Signed)
This visit occurred during the SARS-CoV-2 public health emergency.  Safety protocols were in place, including screening questions prior to the visit, additional usage of staff PPE, and extensive cleaning of exam room while observing appropriate contact time as indicated for disinfecting solutions.  Subjective:     Patient ID: [name] , female    DOB: [birth year] , 39 y.o.   MRN: [id number]   Chief Complaint  Patient presents with  . Hypertension  . 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    Wt Readings from Last 3 Encounters: 08/24/19 : (!) 335 lb 6.4 oz (152.1 kg) 08/22/19 : (!) 336 lb 4.8 oz (152.5 kg) 07/11/19 : (!) 332 lb 4.8 oz (150.7 kg)  Had IUD placed on Monday   Hypertension This is a chronic problem. The current episode started more than 1 year ago. The problem is unchanged. The problem is uncontrolled. Pertinent negatives include no anxiety, chest pain, headaches or palpitations. There are no associated agents to hypertension. Risk factors for coronary artery disease include sedentary lifestyle and obesity. Past treatments include diuretics and angiotensin blockers. There are no compliance problems.  There is no history of chronic renal disease.  Diabetes She has type 2 diabetes mellitus. There are no hypoglycemic associated symptoms. Pertinent negatives for hypoglycemia include no confusion, dizziness, headaches or nervousness/anxiousness. There are no diabetic associated symptoms. Pertinent negatives for diabetes include no chest pain, no fatigue, no polydipsia, no polyphagia and no polyuria. There are no hypoglycemic complications. There are no diabetic complications. Risk factors for coronary artery disease include sedentary lifestyle and obesity. Current diabetic treatment includes oral agent (dual therapy). She is compliant with treatment most of the time. Diabetic current diet: she is eating fresh fruit and vegetables. When asked about  meal planning, she reported none. She has not had a previous visit with a dietitian. She rarely participates in exercise. (Blood sugars are ranging 91-207) An ACE inhibitor/angiotensin II receptor blocker is being taken. Eye exam is not current.     Past Medical History:  Diagnosis Date  . Abnormal Pap smear    f/u wnl  . Asthma   . Fibroid   . Gestational diabetes   . Hypertension   . Morbid obesity ([place])   . Sleep apnea   . Urinary tract infection      Family History  Problem Relation Age of Onset  . Diabetes Father   . Allergies Mother   . Asthma Mother   . Hypertension Mother   . Diabetes Mother   . Diabetes Maternal Grandmother   . Heart disease Maternal Grandmother   . Anesthesia problems Neg Hx   . Hypotension Neg Hx   . Malignant hyperthermia Neg Hx   . Pseudochol deficiency Neg Hx   . Hearing loss Neg Hx      Current Outpatient Medications:  .  albuterol (PROVENTIL HFA;VENTOLIN HFA) 108 (90 BASE) MCG/ACT inhaler, Inhale 2-3 puffs into the lungs every 6 (six) hours as needed for wheezing. Asthma/SOB, Disp: , Rfl:  .  albuterol (PROVENTIL) (5 MG/ML) 0.5% nebulizer solution, Take 0.5 mLs (2.5 mg total) by nebulization every 6 (six) hours as needed for wheezing or shortness of breath., Disp: 20 mL, Rfl: 12 .  ALPRAZolam (XANAX) 0.5 MG tablet, Take 1 tablet (0.5 mg total) by mouth 2 (two) times daily as needed for anxiety or sleep., Disp: 30 tablet, Rfl: 0 .  atorvastatin (LIPITOR) 10 MG tablet, Take  1 tablet (10 mg total) by mouth daily., Disp: 90 tablet, Rfl: 0 .  Blood Glucose Monitoring Suppl (ACCU-CHEK AVIVA) device, USE TO CHECK BLOOD SUGARS UP TO 4 TIMES DAILY  DX CODE:E11.9, Disp: 1 each, Rfl: 0 .  cetirizine (ZYRTEC ALLERGY) 10 MG tablet, Take 1 tablet (10 mg total) by mouth daily., Disp: 90 tablet, Rfl: 0 .  diclofenac (VOLTAREN) 50 MG EC tablet, Take 1 tablet (50 mg total) by mouth 3 (three) times daily., Disp: 45 tablet, Rfl: 3 .  diclofenac Sodium (VOLTAREN)  1 % GEL, Apply 2 g topically 4 (four) times daily. Rub into affected area of foot 2 to 4 times daily, Disp: 100 g, Rfl: 2 .  EPINEPHrine (EPIPEN 2-PAK) 0.3 mg/0.3 mL IJ SOAJ injection, Inject 0.3 mLs (0.3 mg total) into the muscle once as needed (for severe allergic reaction). CAll 911 immediately if you have to use this medicine, Disp: 0.3 mL, Rfl: 1 .  glucose blood test strip, USE TO CHECK BLOOD SUGARS UP TO 4 TIMES DAILY  DX CODE:E11.9, Disp: 100 each, Rfl: 12 .  insulin degludec (TRESIBA FLEXTOUCH) 100 UNIT/ML SOPN FlexTouch Pen, Inject 25 Units into the skin daily., Disp: , Rfl:  .  iron polysaccharides (NIFEREX) 150 MG capsule, Take 1 capsule (150 mg total) by mouth daily., Disp: 90 capsule, Rfl: 2 .  Lancets (ACCU-CHEK MULTICLIX) lancets, USE TO CHECK BLOOD SUGARS UP TO 4 TIMES DAILY  DX CODE:E11.9, Disp: 100 each, Rfl: 12 .  levonorgestrel (MIRENA) 20 MCG/24HR IUD, 1 each by Intrauterine route once., Disp: , Rfl:  .  losartan-hydrochlorothiazide (HYZAAR) 50-12.5 MG tablet, Take 1 tablet by mouth daily., Disp: 30 tablet, Rfl: 0 .  meloxicam (MOBIC) 15 MG tablet, Take 1 tablet (15 mg total) by mouth daily., Disp: 30 tablet, Rfl: 0 .  metFORMIN (GLUCOPHAGE) 1000 MG tablet, Take 1 tablet (1,000 mg total) by mouth 2 (two) times daily with a meal., Disp: 180 tablet, Rfl: 1 .  Semaglutide,0.25 or 0.5MG/DOS, (OZEMPIC, 0.25 OR 0.5 MG/DOSE,) 2 MG/1.5ML SOPN, Inject 0.5 mg into the skin once a week., Disp: 1 pen, Rfl: 3 .  ferrous sulfate 325 (65 FE) MG EC tablet, Take 1 tablet (325 mg total) by mouth 3 (three) times daily with meals. (Patient not taking: Reported on 08/24/2019), Disp: 90 tablet, Rfl: 2   Allergies  Allergen Reactions  . Other Anaphylaxis and Other (See Comments)    Pt has fish allergy Pt has allergy to butter cookies  . Penicillins Rash and Other (See Comments)    Told MD that PCN reaction is rash and that she has taken amoxicillin before     Review of Systems  Constitutional:  Negative.  Negative for fatigue.  HENT: Negative.   Respiratory: Negative.   Cardiovascular: Negative.  Negative for chest pain, palpitations and leg swelling.  Gastrointestinal: Negative.   Endocrine: Negative.  Negative for polydipsia, polyphagia and polyuria.  Genitourinary: Negative.   Skin: Negative.   Neurological: Negative for dizziness and headaches.  Psychiatric/Behavioral: Negative for agitation and confusion. The patient is not nervous/anxious.      Today's Vitals   08/24/19 1121  BP: 122/80  Pulse: 96  Temp: 98.5 F (36.9 C)  Weight: (!) 335 lb 6.4 oz (152.1 kg)  Height: 5' 4.2" (1.631 m)  PainSc: 0-No pain   Body mass index is 57.21 kg/m.   Objective:  Physical Exam Vitals reviewed.  Constitutional:      General: She is not in acute distress.  Appearance: Normal appearance. She is obese.  Eyes:     Pupils: Pupils are equal, round, and reactive to light.  Cardiovascular:     Rate and Rhythm: Normal rate and regular rhythm.     Pulses: Normal pulses.     Heart sounds: Normal heart sounds.  Pulmonary:     Effort: Pulmonary effort is normal. No respiratory distress.     Breath sounds: Normal breath sounds.  Musculoskeletal:        General: Normal range of motion.  Skin:    General: Skin is warm and dry.     Capillary Refill: Capillary refill takes less than 2 seconds.  Neurological:     General: No focal deficit present.     Mental Status: She is alert and oriented to person, place, and time.  Psychiatric:        Mood and Affect: Mood normal.        Behavior: Behavior normal.        Thought Content: Thought content normal.        Judgment: Judgment normal.         Assessment And Plan:     1. Essential hypertension . B/P is controlled.  . CMP ordered to check renal function.  . The importance of regular exercise and dietary modification was stressed to the patient.   2. Uncontrolled type 2 diabetes mellitus with hyperglycemia (HCC)  Chronic,  she reports she is doing better with her medications  Will check HgbA1c today - CMP14+EGFR - Hemoglobin A1c  3. Mixed hyperlipidemia  Chronic, controlled  Continue with current medications - Lipid panel  4. Encounter for immunization  Will give tetanus vaccine today while in office. Refer to order management. TDAP will be administered to adults 103-3 years old every 10 years. - Tdap (BOOSTRIX) injection 0.5 mL  5. Anxiety  She is having intermittent episodes  6. Encounter for screening  Will check titers for her school form and TB quantiferon - QuantiFERON-TB Gold Plus - Varicella zoster antibody, IgG - Rubella Antibodies  7. Class 3 severe obesity due to excess calories with serious comorbidity and body mass index (BMI) of 50.0 to 59.9 in adult [facility])  Chronic, encouraged to make sure she is exercising and eating a healthy diet  She has maintained her weight since her last visit   Minette Brine, [place] DISTANCING DUE TO THE COVID-19 PANDEMIC.

## 2019-08-25 DIAGNOSIS — H10413 Chronic giant papillary conjunctivitis, bilateral: Secondary | ICD-10-CM | POA: Diagnosis not present

## 2019-08-25 DIAGNOSIS — E119 Type 2 diabetes mellitus without complications: Secondary | ICD-10-CM | POA: Diagnosis not present

## 2019-08-25 DIAGNOSIS — H5212 Myopia, left eye: Secondary | ICD-10-CM | POA: Diagnosis not present

## 2019-08-25 DIAGNOSIS — H5211 Myopia, right eye: Secondary | ICD-10-CM | POA: Diagnosis not present

## 2019-08-25 DIAGNOSIS — H16223 Keratoconjunctivitis sicca, not specified as Sjogren's, bilateral: Secondary | ICD-10-CM | POA: Diagnosis not present

## 2019-08-25 DIAGNOSIS — H52203 Unspecified astigmatism, bilateral: Secondary | ICD-10-CM | POA: Diagnosis not present

## 2019-08-25 LAB — HM DIABETES EYE EXAM

## 2019-08-25 NOTE — Progress Notes (Signed)
Subjective: 39 year old female presents the office today for follow-up evaluation of a knot on the Achilles tendon.  She states that she is doing a lot better.  She is doing the stretching exercises as well as using the Voltaren gel.  She also has calluses her big toes issues that have trimmed.  She has no other concerns today. Denies any systemic complaints such as fevers, chills, nausea, vomiting. No acute changes since last appointment, and no other complaints at this time.   Objective: AAO x3, NAD DP/PT pulses palpable bilaterally, CRT less than 3 seconds Along the mid substance of the Achilles tendon on the left side there is mild thickening present but overall appears to be intact.  Thompson test is negative.  No significant discomfort to the area.  Hyperkeratotic lesions to bilateral medial hallux.  No underlying ulceration drainage or any signs of infection.  There is no other areas of tenderness identified. No open lesions or pre-ulcerative lesions.  No pain with calf compression, swelling, warmth, erythema  Assessment: Achilles tendinitis, hyperkeratotic lesion  Plan: -All treatment options discussed with the patient including all alternatives, risks, complications.  -Under continue stretching exercises daily as well as using the Voltaren gel for the Achilles tendon.  Discussed shoe modifications and heel lift.  If symptoms continue or worsen will order an MRI but for now continue with rehab as this area is improving. -Debrided hyperkeratotic lesions without any complications or bleeding. -Patient encouraged to call the office with any questions, concerns, change in symptoms.   Trula Slade DPM

## 2019-08-27 LAB — HEMOGLOBIN A1C
Est. average glucose Bld gHb Est-mCnc: 157 mg/dL
Hgb A1c MFr Bld: 7.1 % — ABNORMAL HIGH (ref 4.8–5.6)

## 2019-08-27 LAB — CMP14+EGFR
ALT: 10 IU/L (ref 0–32)
AST: 13 IU/L (ref 0–40)
Albumin/Globulin Ratio: 1.5 (ref 1.2–2.2)
Albumin: 4 g/dL (ref 3.8–4.8)
Alkaline Phosphatase: 73 IU/L (ref 39–117)
BUN/Creatinine Ratio: 11 (ref 9–23)
BUN: 8 mg/dL (ref 6–20)
Bilirubin Total: 0.3 mg/dL (ref 0.0–1.2)
CO2: 23 mmol/L (ref 20–29)
Calcium: 9.6 mg/dL (ref 8.7–10.2)
Chloride: 102 mmol/L (ref 96–106)
Creatinine, Ser: 0.71 mg/dL (ref 0.57–1.00)
GFR calc Af Amer: 126 mL/min/{1.73_m2} (ref >59)
GFR calc non Af Amer: 109 mL/min/{1.73_m2} (ref >59)
Globulin, Total: 2.6 g/dL (ref 1.5–4.5)
Glucose: 99 mg/dL (ref 65–99)
Potassium: 4.5 mmol/L (ref 3.5–5.2)
Sodium: 140 mmol/L (ref 134–144)
Total Protein: 6.6 g/dL (ref 6.0–8.5)

## 2019-08-27 LAB — LIPID PANEL
Chol/HDL Ratio: 3 ratio (ref 0.0–4.4)
Cholesterol, Total: 127 mg/dL (ref 100–199)
HDL: 43 mg/dL (ref >39)
LDL Chol Calc (NIH): 67 mg/dL (ref 0–99)
Triglycerides: 90 mg/dL (ref 0–149)
VLDL Cholesterol Cal: 17 mg/dL (ref 5–40)

## 2019-08-27 LAB — VARICELLA ZOSTER ANTIBODY, IGG: Varicella zoster IgG: 2592 index (ref >165)

## 2019-08-27 LAB — QUANTIFERON-TB GOLD PLUS
QuantiFERON Mitogen Value: >10 IU/mL
QuantiFERON Nil Value: 0.04 IU/mL
QuantiFERON TB1 Ag Value: 0.04 IU/mL
QuantiFERON TB2 Ag Value: 0.04 IU/mL
QuantiFERON-TB Gold Plus: NEGATIVE

## 2019-08-27 LAB — RUBELLA SCREEN: Rubella Antibodies, IGG: 1.18 index (ref >0.99)

## 2019-08-30 ENCOUNTER — Encounter: Payer: Self-pay | Admitting: General Practice

## 2019-08-31 ENCOUNTER — Other Ambulatory Visit: Payer: Self-pay

## 2019-08-31 MED ORDER — CETIRIZINE HCL 10 MG PO TABS: 10.0000 mg | ORAL_TABLET | Freq: Every day | ORAL | 0 refills | Status: DC

## 2019-09-08 ENCOUNTER — Encounter: Payer: Self-pay | Admitting: Nurse Practitioner

## 2019-09-08 DIAGNOSIS — F411 Generalized anxiety disorder: Secondary | ICD-10-CM | POA: Diagnosis not present

## 2019-09-08 DIAGNOSIS — F332 Major depressive disorder, recurrent severe without psychotic features: Secondary | ICD-10-CM | POA: Diagnosis not present

## 2019-09-09 ENCOUNTER — Telehealth: Payer: Self-pay

## 2019-09-14 ENCOUNTER — Telehealth: Payer: Self-pay

## 2019-09-14 NOTE — Telephone Encounter (Signed)
Patient notified of her lab results and also that her forms are completed and she can come pick them up. YRL,RMA

## 2019-09-19 ENCOUNTER — Encounter: Payer: Self-pay | Admitting: Nurse Practitioner

## 2019-09-19 ENCOUNTER — Ambulatory Visit: Payer: Medicaid Other | Admitting: Nurse Practitioner

## 2019-10-10 DIAGNOSIS — F411 Generalized anxiety disorder: Secondary | ICD-10-CM | POA: Diagnosis not present

## 2019-10-10 DIAGNOSIS — F332 Major depressive disorder, recurrent severe without psychotic features: Secondary | ICD-10-CM | POA: Diagnosis not present

## 2019-10-18 ENCOUNTER — Other Ambulatory Visit: Payer: Self-pay

## 2019-10-18 MED ORDER — LOSARTAN POTASSIUM-HCTZ 50-12.5 MG PO TABS: 1.0000 | ORAL_TABLET | Freq: Every day | ORAL | 0 refills | Status: DC

## 2019-10-20 ENCOUNTER — Other Ambulatory Visit: Payer: Self-pay | Admitting: Nurse Practitioner

## 2019-10-20 DIAGNOSIS — F419 Anxiety disorder, unspecified: Secondary | ICD-10-CM

## 2019-10-20 MED ORDER — ALPRAZOLAM 0.5 MG PO TABS: 0.5000 mg | ORAL_TABLET | Freq: Two times a day (BID) | ORAL | 0 refills | Status: DC | PRN

## 2019-10-24 ENCOUNTER — Other Ambulatory Visit: Payer: Self-pay

## 2019-10-24 MED ORDER — ATORVASTATIN CALCIUM 10 MG PO TABS: 10.0000 mg | ORAL_TABLET | Freq: Every day | ORAL | 0 refills | Status: DC

## 2019-10-28 DIAGNOSIS — F332 Major depressive disorder, recurrent severe without psychotic features: Secondary | ICD-10-CM | POA: Diagnosis not present

## 2019-10-28 DIAGNOSIS — F411 Generalized anxiety disorder: Secondary | ICD-10-CM | POA: Diagnosis not present

## 2019-10-31 ENCOUNTER — Other Ambulatory Visit: Payer: Self-pay

## 2019-10-31 MED ORDER — FERROUS SULFATE 325 (65 FE) MG PO TBEC: 325.0000 mg | DELAYED_RELEASE_TABLET | Freq: Three times a day (TID) | ORAL | 2 refills | Status: DC

## 2019-11-22 ENCOUNTER — Ambulatory Visit: Payer: Medicaid Other | Admitting: Nurse Practitioner

## 2019-11-30 ENCOUNTER — Other Ambulatory Visit: Payer: Self-pay

## 2019-11-30 ENCOUNTER — Ambulatory Visit: Payer: Medicaid Other | Admitting: Nurse Practitioner

## 2019-11-30 ENCOUNTER — Other Ambulatory Visit (HOSPITAL_COMMUNITY)
Admission: RE | Admit: 2019-11-30 | Discharge: 2019-11-30 | Disposition: A | Discharge status: Home / Self Care | Payer: Medicaid Other | Source: Ambulatory Visit | Attending: Nurse Practitioner | Admitting: Nurse Practitioner

## 2019-11-30 ENCOUNTER — Encounter: Payer: Self-pay | Admitting: Nurse Practitioner

## 2019-11-30 VITALS — BP 140/98 | HR 113 | Temp 97.6°F | Ht 64.2 in | Wt 355.4 lb

## 2019-11-30 DIAGNOSIS — F419 Anxiety disorder, unspecified: Secondary | ICD-10-CM | POA: Diagnosis not present

## 2019-11-30 DIAGNOSIS — E1165 Type 2 diabetes mellitus with hyperglycemia: Secondary | ICD-10-CM

## 2019-11-30 DIAGNOSIS — I10 Essential (primary) hypertension: Secondary | ICD-10-CM

## 2019-11-30 DIAGNOSIS — E66813 Obesity, class 3: Secondary | ICD-10-CM

## 2019-11-30 DIAGNOSIS — Z6841 Body Mass Index (BMI) 40.0 and over, adult: Secondary | ICD-10-CM

## 2019-11-30 DIAGNOSIS — N760 Acute vaginitis: Secondary | ICD-10-CM | POA: Diagnosis not present

## 2019-11-30 DIAGNOSIS — E782 Mixed hyperlipidemia: Secondary | ICD-10-CM

## 2019-11-30 MED ORDER — FLUCONAZOLE 100 MG PO TABS: 100.0000 mg | ORAL_TABLET | Freq: Every day | ORAL | 0 refills | Status: DC

## 2019-11-30 MED ORDER — OZEMPIC (1 MG/DOSE) 2 MG/1.5ML ~~LOC~~ SOPN: 1.0000 mg | PEN_INJECTOR | SUBCUTANEOUS | 1 refills | Status: DC

## 2019-11-30 NOTE — Progress Notes (Signed)
This visit occurred during the SARS-CoV-2 public health emergency.  Safety protocols were in place, including screening questions prior to the visit, additional usage of staff PPE, and extensive cleaning of exam room while observing appropriate contact time as indicated for disinfecting solutions.  Subjective:     Patient ID: [name] , female    DOB: [birth year] , 39 y.o.   MRN: [id number]   Chief Complaint  Patient presents with  . Hypertension  . Vaginitis    HPI    Wt Readings from Last 3 Encounters: 11/30/19 : (!) 355 lb 6.4 oz (161.2 kg) 08/24/19 : (!) 335 lb 6.4 oz (152.1 kg) 08/22/19 : (!) 336 lb 4.8 oz (152.5 kg)   She is currently in nursing school.   Hypertension This is a chronic problem. The current episode started more than 1 year ago. The problem is unchanged. The problem is uncontrolled. Pertinent negatives include no anxiety, blurred vision, chest pain, headaches or palpitations. There are no associated agents to hypertension. Risk factors for coronary artery disease include sedentary lifestyle and obesity. Past treatments include diuretics and angiotensin blockers. There are no compliance problems.  There is no history of chronic renal disease.  Diabetes She presents for her follow-up diabetic visit. She has type 2 diabetes mellitus. There are no hypoglycemic associated symptoms. Pertinent negatives for hypoglycemia include no confusion, dizziness, headaches or nervousness/anxiousness. Associated symptoms include polydipsia, polyphagia and polyuria. Pertinent negatives for diabetes include no blurred vision, no chest pain and no fatigue. There are no hypoglycemic complications. There are no diabetic complications. Risk factors for coronary artery disease include sedentary lifestyle and obesity. Current diabetic treatment includes oral agent (dual therapy). She is compliant with treatment most of the time. She is following a generally healthy diet. When asked about  meal planning, she reported none. She has not had a previous visit with a dietitian. She rarely participates in exercise. (She is not checking her blood sugar regularly due to her being in school. She is snacking at night.) An ACE inhibitor/angiotensin II receptor blocker is being taken. She does not see a podiatrist.Eye exam is not current.  Vaginal Itching The patient's primary symptoms include genital itching. Primary symptoms comment: swollen around the clitoris. Pertinent negatives include no headaches. Treatments tried: monistat cream  She is sexually active. No, her partner does not have an STD. She uses an IUD for contraception. There is no history of an abdominal surgery.     Past Medical History:  Diagnosis Date  . Abnormal Pap smear    f/u wnl  . Asthma   . Fibroid   . Gestational diabetes   . Hypertension   . Morbid obesity ([place])   . Sleep apnea   . Urinary tract infection      Family History  Problem Relation Age of Onset  . Diabetes Father   . Allergies Mother   . Asthma Mother   . Hypertension Mother   . Diabetes Mother   . Diabetes Maternal Grandmother   . Heart disease Maternal Grandmother   . Anesthesia problems Neg Hx   . Hypotension Neg Hx   . Malignant hyperthermia Neg Hx   . Pseudochol deficiency Neg Hx   . Hearing loss Neg Hx      Current Outpatient Medications:  .  albuterol (PROVENTIL HFA;VENTOLIN HFA) 108 (90 BASE) MCG/ACT inhaler, Inhale 2-3 puffs into the lungs every 6 (six) hours as needed for wheezing. Asthma/SOB, Disp: , Rfl:  .  albuterol (PROVENTIL) (5  MG/ML) 0.5% nebulizer solution, Take 0.5 mLs (2.5 mg total) by nebulization every 6 (six) hours as needed for wheezing or shortness of breath., Disp: 20 mL, Rfl: 12 .  ALPRAZolam (XANAX) 0.5 MG tablet, Take 1 tablet (0.5 mg total) by mouth 2 (two) times daily as needed for anxiety or sleep., Disp: 20 tablet, Rfl: 0 .  atorvastatin (LIPITOR) 10 MG tablet, Take 1 tablet (10 mg total) by mouth  daily., Disp: 90 tablet, Rfl: 0 .  Blood Glucose Monitoring Suppl (ACCU-CHEK AVIVA) device, USE TO CHECK BLOOD SUGARS UP TO 4 TIMES DAILY  DX CODE:E11.9, Disp: 1 each, Rfl: 0 .  cetirizine (ZYRTEC ALLERGY) 10 MG tablet, Take 1 tablet (10 mg total) by mouth daily., Disp: 90 tablet, Rfl: 0 .  diclofenac (VOLTAREN) 50 MG EC tablet, Take 1 tablet (50 mg total) by mouth 3 (three) times daily., Disp: 45 tablet, Rfl: 3 .  diclofenac Sodium (VOLTAREN) 1 % GEL, Apply 2 g topically 4 (four) times daily. Rub into affected area of foot 2 to 4 times daily, Disp: 100 g, Rfl: 2 .  EPINEPHrine (EPIPEN 2-PAK) 0.3 mg/0.3 mL IJ SOAJ injection, Inject 0.3 mLs (0.3 mg total) into the muscle once as needed (for severe allergic reaction). CAll 911 immediately if you have to use this medicine, Disp: 0.3 mL, Rfl: 1 .  ferrous sulfate 325 (65 FE) MG EC tablet, Take 1 tablet (325 mg total) by mouth 3 (three) times daily with meals., Disp: 90 tablet, Rfl: 2 .  glucose blood test strip, USE TO CHECK BLOOD SUGARS UP TO 4 TIMES DAILY  DX CODE:E11.9, Disp: 100 each, Rfl: 12 .  insulin degludec (TRESIBA FLEXTOUCH) 100 UNIT/ML SOPN FlexTouch Pen, Inject 25 Units into the skin daily., Disp: , Rfl:  .  Lancets (ACCU-CHEK MULTICLIX) lancets, USE TO CHECK BLOOD SUGARS UP TO 4 TIMES DAILY  DX CODE:E11.9, Disp: 100 each, Rfl: 12 .  levonorgestrel (MIRENA) 20 MCG/24HR IUD, 1 each by Intrauterine route once., Disp: , Rfl:  .  losartan-hydrochlorothiazide (HYZAAR) 50-12.5 MG tablet, Take 1 tablet by mouth daily., Disp: 30 tablet, Rfl: 0 .  meloxicam (MOBIC) 15 MG tablet, Take 1 tablet (15 mg total) by mouth daily., Disp: 30 tablet, Rfl: 0 .  metFORMIN (GLUCOPHAGE) 1000 MG tablet, Take 1 tablet (1,000 mg total) by mouth 2 (two) times daily with a meal., Disp: 180 tablet, Rfl: 1 .  Semaglutide,0.25 or 0.5MG /DOS, (OZEMPIC, 0.25 OR 0.5 MG/DOSE,) 2 MG/1.5ML SOPN, Inject 0.5 mg into the skin once a week., Disp: 1 pen, Rfl: 3 .  iron polysaccharides  (NIFEREX) 150 MG capsule, Take 1 capsule (150 mg total) by mouth daily. (Patient not taking: Reported on 11/30/2019), Disp: 90 capsule, Rfl: 2   Allergies  Allergen Reactions  . Other Anaphylaxis and Other (See Comments)    Pt has fish allergy Pt has allergy to butter cookies  . Penicillins Rash and Other (See Comments)    Told MD that PCN reaction is rash and that she has taken amoxicillin before     Review of Systems  Constitutional: Negative.  Negative for fatigue.  HENT: Negative.   Eyes: Negative for blurred vision.  Respiratory: Negative.   Cardiovascular: Negative.  Negative for chest pain, palpitations and leg swelling.  Gastrointestinal: Negative.   Endocrine: Positive for polydipsia, polyphagia and polyuria.  Genitourinary: Negative.   Skin: Negative.   Neurological: Negative for dizziness and headaches.  Psychiatric/Behavioral: Negative for agitation and confusion. The patient is not nervous/anxious.  Today's Vitals   11/30/19 1619  BP: (!) 140/98  Pulse: (!) 113  Temp: 97.6 F (36.4 C)  TempSrc: Oral  SpO2: 96%  Weight: (!) 355 lb 6.4 oz (161.2 kg)  Height: 5' 4.2" (1.631 m)   Body mass index is 60.62 kg/m.   Objective:  Physical Exam Vitals reviewed.  Constitutional:      General: She is not in acute distress.    Appearance: Normal appearance. She is obese.  Eyes:     Pupils: Pupils are equal, round, and reactive to light.  Cardiovascular:     Rate and Rhythm: Normal rate and regular rhythm.     Pulses: Normal pulses.     Heart sounds: Normal heart sounds.  Pulmonary:     Effort: Pulmonary effort is normal. No respiratory distress.     Breath sounds: Normal breath sounds.  Musculoskeletal:        General: Normal range of motion.  Skin:    General: Skin is warm and dry.     Capillary Refill: Capillary refill takes less than 2 seconds.  Neurological:     General: No focal deficit present.     Mental Status: She is alert and oriented to  person, place, and time.  Psychiatric:        Mood and Affect: Mood normal.        Behavior: Behavior normal.        Thought Content: Thought content normal.        Judgment: Judgment normal.         Assessment And Plan:   1. Uncontrolled type 2 diabetes mellitus with hyperglycemia (HCC)  Chronic, she is tolerating ozempic well   She has been under increased stress with school  Will check HgbA1c - Semaglutide, 1 MG/DOSE, (OZEMPIC, 1 MG/DOSE,) 2 MG/1.5ML SOPN; Inject 1 mg into the skin once a week.  Dispense: 9 mL; Refill: 1  2. Essential hypertension  Chronic, slightly elevated today  She has been under increased stress with school.    Advised to avoid high salt foods  3. Mixed hyperlipidemia  Chronic, will check lipid panel  4. Anxiety  Chronic, this is worsened since being in nursing school  I have encouraged her to focus on self care.    5. Acute vaginitis  Likely related to her elevated blood sugars - fluconazole (DIFLUCAN) 100 MG tablet; Take 1 tablet (100 mg total) by mouth daily. Take 1 tablet by mouth now repeat in 5 days  Dispense: 2 tablet; Refill: 0  6. Class 3 severe obesity due to excess calories with serious comorbidity and body mass index (BMI) of 60.0 to 69.9 in adult [facility])  She has gained 10 lbs since her last office visit  She is under increased stress.  Encouraged to increase her physical activity   Minette Brine, FNP    THE PATIENT IS ENCOURAGED TO PRACTICE SOCIAL DISTANCING DUE TO THE COVID-19 PANDEMIC.

## 2019-12-01 LAB — CMP14+EGFR
ALT: 19 IU/L (ref 0–32)
AST: 14 IU/L (ref 0–40)
Albumin/Globulin Ratio: 1.4 (ref 1.2–2.2)
Albumin: 4.3 g/dL (ref 3.8–4.8)
Alkaline Phosphatase: 92 IU/L (ref 39–117)
BUN/Creatinine Ratio: 11 (ref 9–23)
BUN: 10 mg/dL (ref 6–20)
Bilirubin Total: 0.5 mg/dL (ref 0.0–1.2)
CO2: 21 mmol/L (ref 20–29)
Calcium: 10.2 mg/dL (ref 8.7–10.2)
Chloride: 97 mmol/L (ref 96–106)
Creatinine, Ser: 0.91 mg/dL (ref 0.57–1.00)
GFR calc Af Amer: 93 mL/min/{1.73_m2} (ref >59)
GFR calc non Af Amer: 81 mL/min/{1.73_m2} (ref >59)
Globulin, Total: 3 g/dL (ref 1.5–4.5)
Glucose: 354 mg/dL — ABNORMAL HIGH (ref 65–99)
Potassium: 4 mmol/L (ref 3.5–5.2)
Sodium: 136 mmol/L (ref 134–144)
Total Protein: 7.3 g/dL (ref 6.0–8.5)

## 2019-12-01 LAB — LIPID PANEL
Chol/HDL Ratio: 4.2 ratio (ref 0.0–4.4)
Cholesterol, Total: 162 mg/dL (ref 100–199)
HDL: 39 mg/dL — ABNORMAL LOW (ref >39)
LDL Chol Calc (NIH): 74 mg/dL (ref 0–99)
Triglycerides: 305 mg/dL — ABNORMAL HIGH (ref 0–149)
VLDL Cholesterol Cal: 49 mg/dL — ABNORMAL HIGH (ref 5–40)

## 2019-12-01 LAB — HEMOGLOBIN A1C
Est. average glucose Bld gHb Est-mCnc: 275 mg/dL
Hgb A1c MFr Bld: 11.2 % — ABNORMAL HIGH (ref 4.8–5.6)

## 2019-12-02 LAB — CERVICOVAGINAL ANCILLARY ONLY
Bacterial Vaginitis (gardnerella): NEGATIVE
Chlamydia: NEGATIVE
Comment: NEGATIVE
Comment: NEGATIVE
Comment: NEGATIVE
Comment: NORMAL
Neisseria Gonorrhea: NEGATIVE
Trichomonas: NEGATIVE

## 2019-12-05 ENCOUNTER — Other Ambulatory Visit: Payer: Self-pay

## 2019-12-05 MED ORDER — LOSARTAN POTASSIUM-HCTZ 50-12.5 MG PO TABS: 1.0000 | ORAL_TABLET | Freq: Every day | ORAL | 0 refills | Status: DC

## 2019-12-08 ENCOUNTER — Ambulatory Visit: Payer: Medicaid Other | Attending: Family

## 2019-12-08 ENCOUNTER — Encounter: Payer: Self-pay | Admitting: Podiatry

## 2019-12-08 ENCOUNTER — Other Ambulatory Visit: Payer: Self-pay

## 2019-12-08 ENCOUNTER — Ambulatory Visit: Payer: Medicaid Other | Admitting: Podiatry

## 2019-12-08 DIAGNOSIS — E119 Type 2 diabetes mellitus without complications: Secondary | ICD-10-CM | POA: Diagnosis not present

## 2019-12-08 DIAGNOSIS — Z23 Encounter for immunization: Secondary | ICD-10-CM

## 2019-12-08 DIAGNOSIS — M7662 Achilles tendinitis, left leg: Secondary | ICD-10-CM

## 2019-12-08 DIAGNOSIS — D492 Neoplasm of unspecified behavior of bone, soft tissue, and skin: Secondary | ICD-10-CM

## 2019-12-08 DIAGNOSIS — Z794 Long term (current) use of insulin: Secondary | ICD-10-CM

## 2019-12-08 NOTE — Progress Notes (Signed)
Subjective: 39 year old female presents the office today for evaluation of calluses to both of her big toes.  Areas are painful but denies any redness or drainage and swelling.  Her heels are doing much better.  She has changed shoes.  Helpful.  She is been working [employer] with good arch support. Denies any systemic complaints such as fevers, chills, nausea, vomiting. No acute changes since last appointment, and no other complaints at this time.   Objective: AAO x3, NAD DP/PT pulses palpable bilaterally, CRT less than 3 seconds  No pain with course or insertion Achilles tendon.  There is no area of pinpoint tenderness.  There is no edema, erythema.  Hyperkeratotic lesions bilateral medial hallux.  No ongoing ulceration drainage or signs of infection. No open lesions or pre-ulcerative lesions.  No pain with calf compression, swelling, warmth, erythema  Assessment: Achilles tendinitis-resolved, hyperkeratotic lesion  Plan: -All treatment options discussed with the patient including all alternatives, risks, complications.  -Continue with stretching, rehab exercises daily.  Continue wearing supportive shoes. -Debrided hyperkeratotic lesions without any complications or bleeding.-Discussed good arch support as well as moisturizer.  Return in about 5 months (around 05/09/2020) for foot pain.  Trula Slade DPM

## 2019-12-08 NOTE — Progress Notes (Signed)
   Covid-19 Vaccination Clinic  Name:  [name]    MRN: [id number] DOB: [birth year]  12/08/2019  [name] was observed post Covid-19 immunization for 15 minutes without incident. She was provided with Vaccine Information Sheet and instruction to access the V-Safe system.   [name] was instructed to call 911 with any severe reactions post vaccine: [name] Difficulty breathing  . Swelling of face and throat  . A fast heartbeat  . A bad rash all over body  . Dizziness and weakness   Immunizations Administered    Name Date Dose VIS Date Route   Moderna COVID-19 Vaccine 12/08/2019  2:02 PM 0.5 mL 07/2019 Intramuscular   Manufacturer: Moderna   Lot: [id number]   [id number]

## 2019-12-16 ENCOUNTER — Ambulatory Visit: Payer: Medicaid Other | Admitting: Podiatry

## 2019-12-19 ENCOUNTER — Ambulatory Visit: Payer: Self-pay

## 2019-12-19 ENCOUNTER — Other Ambulatory Visit: Payer: Self-pay

## 2019-12-19 ENCOUNTER — Telehealth: Payer: Self-pay

## 2019-12-19 DIAGNOSIS — E1165 Type 2 diabetes mellitus with hyperglycemia: Secondary | ICD-10-CM

## 2019-12-19 DIAGNOSIS — E782 Mixed hyperlipidemia: Secondary | ICD-10-CM

## 2019-12-19 DIAGNOSIS — I10 Essential (primary) hypertension: Secondary | ICD-10-CM

## 2019-12-20 NOTE — Chronic Care Management (AMB) (Addendum)
  Care Management   Outreach Note  12/19/2019 Name: [name] MRN: [id number] DOB: Sep 20, 1980  Referred by: Minette Brine, FNP Reason for referral : Chronic Care Management ([facility] RN CC Call )   An unsuccessful telephone outreach was attempted today. The patient was referred to the case management team for assistance with care management and care coordination. Spoke with Ms. Pohl who answered the phone, she requested to give me a call back.   Follow Up Plan: Telephone follow up appointment with care management team member scheduled for: 01/19/20  Barb Merino, RN, BSN, CCM Care Management Coordinator [facility]  Direct Phone: [phone]

## 2020-01-07 ENCOUNTER — Encounter: Payer: Self-pay | Admitting: Podiatry

## 2020-01-07 ENCOUNTER — Ambulatory Visit: Payer: Medicaid Other | Admitting: Podiatry

## 2020-01-07 ENCOUNTER — Ambulatory Visit (INDEPENDENT_AMBULATORY_CARE_PROVIDER_SITE_OTHER): Payer: Medicaid Other

## 2020-01-07 ENCOUNTER — Other Ambulatory Visit: Payer: Self-pay

## 2020-01-07 DIAGNOSIS — S99922A Unspecified injury of left foot, initial encounter: Secondary | ICD-10-CM | POA: Diagnosis not present

## 2020-01-07 DIAGNOSIS — L601 Onycholysis: Secondary | ICD-10-CM

## 2020-01-07 DIAGNOSIS — Z794 Long term (current) use of insulin: Secondary | ICD-10-CM

## 2020-01-07 DIAGNOSIS — E119 Type 2 diabetes mellitus without complications: Secondary | ICD-10-CM

## 2020-01-07 MED ORDER — SULFAMETHOXAZOLE-TRIMETHOPRIM 800-160 MG PO TABS: 1.0000 | ORAL_TABLET | Freq: Two times a day (BID) | ORAL | 0 refills | Status: DC

## 2020-01-07 NOTE — Patient Instructions (Signed)

## 2020-01-09 ENCOUNTER — Other Ambulatory Visit: Payer: Self-pay

## 2020-01-09 ENCOUNTER — Encounter: Payer: Self-pay | Admitting: Nurse Practitioner

## 2020-01-09 ENCOUNTER — Ambulatory Visit (INDEPENDENT_AMBULATORY_CARE_PROVIDER_SITE_OTHER): Payer: Medicaid Other | Admitting: Nurse Practitioner

## 2020-01-09 VITALS — BP 146/90 | HR 105 | Temp 98.5°F | Ht 63.6 in | Wt 359.2 lb

## 2020-01-09 DIAGNOSIS — F329 Major depressive disorder, single episode, unspecified: Secondary | ICD-10-CM

## 2020-01-09 DIAGNOSIS — Z6841 Body Mass Index (BMI) 40.0 and over, adult: Secondary | ICD-10-CM

## 2020-01-09 DIAGNOSIS — E66813 Obesity, class 3: Secondary | ICD-10-CM

## 2020-01-09 DIAGNOSIS — F32A Depression, unspecified: Secondary | ICD-10-CM

## 2020-01-09 DIAGNOSIS — R21 Rash and other nonspecific skin eruption: Secondary | ICD-10-CM

## 2020-01-09 MED ORDER — EPINEPHRINE 0.3 MG/0.3ML IJ SOAJ: 0.3000 mg | Freq: Once | INTRAMUSCULAR | 1 refills | Status: DC | PRN

## 2020-01-09 MED ORDER — BUPROPION HCL ER (XL) 150 MG PO TB24: 150.0000 mg | ORAL_TABLET | Freq: Every day | ORAL | 2 refills | Status: DC

## 2020-01-09 NOTE — Progress Notes (Signed)
Subjective: 39 year old female presents the office today for concerns of her left big toenail becoming loose after she hit it on a cooler at a store about 1 month ago.  She states it hurt originally however the pain is resolved and the nail is loose.  She had a pedicure performed last week and she was told she is to have the toe checked.  Denies any swelling redness or drainage. Denies any systemic complaints such as fevers, chills, nausea, vomiting. No acute changes since last appointment, and no other complaints at this time.   Objective: AAO x3, NAD DP/PT pulses palpable bilaterally, CRT less than 3 seconds The left hallux toenail is loose and only attached on the proximal nail fold there is localized edema to the proximal nail border.  There is no drainage or pus.  There is no erythema or ascending cellulitis. No open lesions or pre-ulcerative lesions.  No pain with calf compression, swelling, warmth, erythema  Assessment: Left hallux onycholysis with localized swelling  Plan: -All treatment options discussed with the patient including all alternatives, risks, complications.  -X-rays obtained reviewed.  No evidence of acute fracture or osteomyelitis. -At this time, recommended total nail removal without chemical matricectomy to the left hallux toenail. Risks and complications were discussed with the patient for which they understand and  verbally consent to the procedure. Under sterile conditions a total of 3 mL of a mixture of 2% lidocaine plain and 0.5% Marcaine plain was infiltrated in a hallux block fashion. Once anesthetized, the skin was prepped in sterile fashion. A tourniquet was then applied. Next the left hallux nail was sharply excised making sure to remove the entire offending nail border. Once the nail was removed, the area was debrided and the underlying skin was intact. The area was irrigated and hemostasis was obtained.  A dry sterile dressing was applied. After application of the  dressing the tourniquet was removed and there is found to be an immediate capillary refill time to the digit. The patient tolerated the procedure well any complications. Post procedure instructions were discussed the patient for which he verbally understood. Follow-up in one week for nail check or sooner if any problems are to arise. Discussed signs/symptoms of worsening infection and directed to call the office immediately should any occur or go directly to the emergency room. In the meantime, encouraged to call the office with any questions, concerns, changes symptoms. -Bactrim -Patient encouraged to call the office with any questions, concerns, change in symptoms.   Trula Slade DPM

## 2020-01-09 NOTE — Progress Notes (Signed)
This visit occurred during the SARS-CoV-2 public health emergency.  Safety protocols were in place, including screening questions prior to the visit, additional usage of staff PPE, and extensive cleaning of exam room while observing appropriate contact time as indicated for disinfecting solutions.  Subjective:     Patient ID: [name] , female    DOB: 12-24-1980 , 39 y.o.   MRN: [id number]   Chief Complaint  Patient presents with  . Weight Check    HPI  Here to follow up on Ozempic, she is doing okay with the increase.  She does not have a decreased appetite.  She is scheduled to have her second covid vaccine tomorrow. She has been getting the lipo b weekly since April feels has helped with her mental clarity.  She has not had a menstrual cycle since April, has a Mirena since January 2021.    Wt Readings from Last 3 Encounters: 01/09/20 : (!) 359 lb 3.2 oz (162.9 kg) 11/30/19 : (!) 355 lb 6.4 oz (161.2 kg) 08/24/19 : (!) 335 lb 6.4 oz (152.1 kg)  She is taking bactrim post toenail removal from the left great toe after hitting on the cooler from a register. Was removed from [place].  She now has a rash to her arms, nipples are itching and rash underneath arms. She has not taken any benadryl due to needing to study for her finals.  Hypertension This is a chronic problem. The current episode started more than 1 year ago. The problem has been gradually worsening since onset. The problem is uncontrolled. Pertinent negatives include no chest pain, headaches or palpitations. Risk factors for coronary artery disease include obesity and sedentary lifestyle.     Past Medical History:  Diagnosis Date  . Abnormal Pap smear    f/u wnl  . Asthma   . Fibroid   . Gestational diabetes   . Hypertension   . Morbid obesity ([place])   . Sleep apnea   . Urinary tract infection      Family History  Problem Relation Age of Onset  . Diabetes Father   . Allergies Mother   . Asthma Mother   .  Hypertension Mother   . Diabetes Mother   . Diabetes Maternal Grandmother   . Heart disease Maternal Grandmother   . Anesthesia problems Neg Hx   . Hypotension Neg Hx   . Malignant hyperthermia Neg Hx   . Pseudochol deficiency Neg Hx   . Hearing loss Neg Hx      Current Outpatient Medications:  .  albuterol (PROVENTIL HFA;VENTOLIN HFA) 108 (90 BASE) MCG/ACT inhaler, Inhale 2-3 puffs into the lungs every 6 (six) hours as needed for wheezing. Asthma/SOB, Disp: , Rfl:  .  albuterol (PROVENTIL) (5 MG/ML) 0.5% nebulizer solution, Take 0.5 mLs (2.5 mg total) by nebulization every 6 (six) hours as needed for wheezing or shortness of breath., Disp: 20 mL, Rfl: 12 .  ALPRAZolam (XANAX) 0.5 MG tablet, Take 1 tablet (0.5 mg total) by mouth 2 (two) times daily as needed for anxiety or sleep., Disp: 20 tablet, Rfl: 0 .  atorvastatin (LIPITOR) 10 MG tablet, Take 1 tablet (10 mg total) by mouth daily., Disp: 90 tablet, Rfl: 0 .  cetirizine (ZYRTEC ALLERGY) 10 MG tablet, Take 1 tablet (10 mg total) by mouth daily., Disp: 90 tablet, Rfl: 0 .  diclofenac (VOLTAREN) 50 MG EC tablet, Take 1 tablet (50 mg total) by mouth 3 (three) times daily., Disp: 45 tablet, Rfl: 3 .  diclofenac Sodium (VOLTAREN) 1 % GEL, Apply 2 g topically 4 (four) times daily. Rub into affected area of foot 2 to 4 times daily, Disp: 100 g, Rfl: 2 .  EPINEPHrine (EPIPEN 2-PAK) 0.3 mg/0.3 mL IJ SOAJ injection, Inject 0.3 mLs (0.3 mg total) into the muscle once as needed (for severe allergic reaction). CAll 911 immediately if you have to use this medicine, Disp: 0.3 mL, Rfl: 1 .  ferrous sulfate 325 (65 FE) MG EC tablet, Take 1 tablet (325 mg total) by mouth 3 (three) times daily with meals., Disp: 90 tablet, Rfl: 2 .  insulin degludec (TRESIBA FLEXTOUCH) 100 UNIT/ML SOPN FlexTouch Pen, Inject 25 Units into the skin daily., Disp: , Rfl:  .  levonorgestrel (MIRENA) 20 MCG/24HR IUD, 1 each by Intrauterine route once., Disp: , Rfl:  .   losartan-hydrochlorothiazide (HYZAAR) 50-12.5 MG tablet, Take 1 tablet by mouth daily., Disp: 90 tablet, Rfl: 0 .  meloxicam (MOBIC) 15 MG tablet, Take 1 tablet (15 mg total) by mouth daily., Disp: 30 tablet, Rfl: 0 .  metFORMIN (GLUCOPHAGE) 1000 MG tablet, Take 1 tablet (1,000 mg total) by mouth 2 (two) times daily with a meal., Disp: 180 tablet, Rfl: 1 .  Semaglutide, 1 MG/DOSE, (OZEMPIC, 1 MG/DOSE,) 2 MG/1.5ML SOPN, Inject 1 mg into the skin once a week., Disp: 9 mL, Rfl: 1 .  Blood Glucose Monitoring Suppl (ACCU-CHEK AVIVA) device, USE TO CHECK BLOOD SUGARS UP TO 4 TIMES DAILY  DX CODE:E11.9 (Patient not taking: Reported on 01/09/2020), Disp: 1 each, Rfl: 0 .  fluconazole (DIFLUCAN) 100 MG tablet, Take 1 tablet (100 mg total) by mouth daily. Take 1 tablet by mouth now repeat in 5 days (Patient not taking: Reported on 01/09/2020), Disp: 2 tablet, Rfl: 0 .  glucose blood test strip, USE TO CHECK BLOOD SUGARS UP TO 4 TIMES DAILY  DX CODE:E11.9 (Patient not taking: Reported on 01/09/2020), Disp: 100 each, Rfl: 12 .  iron polysaccharides (NIFEREX) 150 MG capsule, Take 1 capsule (150 mg total) by mouth daily. (Patient not taking: Reported on 11/30/2019), Disp: 90 capsule, Rfl: 2 .  Lancets (ACCU-CHEK MULTICLIX) lancets, USE TO CHECK BLOOD SUGARS UP TO 4 TIMES DAILY  DX CODE:E11.9 (Patient not taking: Reported on 01/09/2020), Disp: 100 each, Rfl: 12 .  sulfamethoxazole-trimethoprim (BACTRIM DS) 800-160 MG tablet, Take 1 tablet by mouth 2 (two) times daily. (Patient not taking: Reported on 01/09/2020), Disp: 14 tablet, Rfl: 0   Allergies  Allergen Reactions  . Other Anaphylaxis and Other (See Comments)    Pt has fish allergy Pt has allergy to butter cookies  . Penicillins Rash and Other (See Comments)    Told MD that PCN reaction is rash and that she has taken amoxicillin before     Review of Systems  Constitutional: Negative.   Respiratory: Negative.   Cardiovascular: Negative.  Negative for chest pain,  palpitations and leg swelling.  Neurological: Negative for dizziness and headaches.  Psychiatric/Behavioral: Negative.      Today's Vitals   01/09/20 1630  BP: (!) 146/90  Pulse: (!) 105  Temp: 98.5 F (36.9 C)  Weight: (!) 359 lb 3.2 oz (162.9 kg)  Height: 5' 3.6" (1.615 m)  PainSc: 7   PainLoc: Abdomen   Body mass index is 62.43 kg/m.   Objective:  Physical Exam Constitutional:      General: She is not in acute distress.    Appearance: Normal appearance. She is obese.  Cardiovascular:     Rate and Rhythm:  Normal rate and regular rhythm.     Pulses: Normal pulses.     Heart sounds: Normal heart sounds. No murmur.  Pulmonary:     Effort: Pulmonary effort is normal. No respiratory distress.     Breath sounds: Normal breath sounds.  Skin:    General: Skin is warm and dry.     Capillary Refill: Capillary refill takes less than 2 seconds.  Neurological:     General: No focal deficit present.     Mental Status: She is alert and oriented to person, place, and time.     Cranial Nerves: No cranial nerve deficit.  Psychiatric:        Mood and Affect: Mood normal.        Behavior: Behavior normal.        Thought Content: Thought content normal.        Judgment: Judgment normal.         Assessment And Plan:     1. Class 3 severe obesity due to excess calories with serious comorbidity and body mass index (BMI) of 60.0 to 69.9 in adult [facility])  She has had a 4 lb weight gain  Will start her on wellbutrin for her depression which may help with her weight loss.  - buPROPion (WELLBUTRIN XL) 150 MG 24 hr tablet; Take 1 tablet (150 mg total) by mouth daily.  Dispense: 30 tablet; Refill: 2  2. Depression, unspecified depression type  Score was 12 at last visit  She is under increased stress with school and her children as a single parent - buPROPion (WELLBUTRIN XL) 150 MG 24 hr tablet; Take 1 tablet (150 mg total) by mouth daily.  Dispense: 30 tablet; Refill: 2  3. Rash and  nonspecific skin eruption  Arms and axilla has rash, advised to call [facility] to see which medication they would like to change her to.   I have advised her to stop taking the Bactrim and take cetirizine since her HgbA1c is up want to avoid steroids.     Minette Brine, FNP    THE PATIENT IS ENCOURAGED TO PRACTICE SOCIAL DISTANCING DUE TO THE COVID-19 PANDEMIC.

## 2020-01-10 ENCOUNTER — Ambulatory Visit: Payer: Medicaid Other | Attending: Family

## 2020-01-10 ENCOUNTER — Telehealth: Payer: Self-pay | Admitting: Podiatry

## 2020-01-10 DIAGNOSIS — Z23 Encounter for immunization: Secondary | ICD-10-CM

## 2020-01-10 NOTE — Progress Notes (Signed)
   Covid-19 Vaccination Clinic  Name:  [name]    MRN: [id number] DOB: 28-Dec-1980  01/10/2020  [name] was observed post Covid-19 immunization for 15 minutes without incident. She was provided with Vaccine Information Sheet and instruction to access the V-Safe system.   [name] was instructed to call 911 with any severe reactions post vaccine: [name] Difficulty breathing  . Swelling of face and throat  . A fast heartbeat  . A bad rash all over body  . Dizziness and weakness   Immunizations Administered    Name Date Dose VIS Date Route   Moderna COVID-19 Vaccine 01/10/2020  4:27 PM 0.5 mL 07/2019 Intramuscular   Manufacturer: Moderna   Lot: [id number]   [place]: 46503-546-56

## 2020-01-10 NOTE — Telephone Encounter (Addendum)
I called pt states the bactrim gave her diarrhea, abdominal cramping and rash. Pt states her PCP said to stop the Bactrim. Pt states her toe looks fine.

## 2020-01-10 NOTE — Telephone Encounter (Signed)
Patient was prescribed Bactrim and she is having a reaction and needs to prescribed something else.

## 2020-01-11 NOTE — Telephone Encounter (Signed)
Thanks. We will hold any further antibiotics unless needed. Please add Bactrim to her allergy list.

## 2020-01-18 ENCOUNTER — Other Ambulatory Visit: Payer: Self-pay

## 2020-01-18 DIAGNOSIS — E1165 Type 2 diabetes mellitus with hyperglycemia: Secondary | ICD-10-CM

## 2020-01-18 MED ORDER — TRESIBA FLEXTOUCH 100 UNIT/ML ~~LOC~~ SOPN: 25.0000 [IU] | PEN_INJECTOR | Freq: Every day | SUBCUTANEOUS | 1 refills | Status: DC

## 2020-01-19 ENCOUNTER — Telehealth: Payer: Self-pay

## 2020-01-19 ENCOUNTER — Ambulatory Visit: Payer: Self-pay

## 2020-01-19 ENCOUNTER — Other Ambulatory Visit: Payer: Self-pay

## 2020-01-19 DIAGNOSIS — I10 Essential (primary) hypertension: Secondary | ICD-10-CM

## 2020-01-19 DIAGNOSIS — Z6841 Body Mass Index (BMI) 40.0 and over, adult: Secondary | ICD-10-CM

## 2020-01-19 DIAGNOSIS — F32A Depression, unspecified: Secondary | ICD-10-CM

## 2020-01-19 DIAGNOSIS — E782 Mixed hyperlipidemia: Secondary | ICD-10-CM

## 2020-01-19 DIAGNOSIS — E1165 Type 2 diabetes mellitus with hyperglycemia: Secondary | ICD-10-CM

## 2020-01-19 MED ORDER — ATORVASTATIN CALCIUM 10 MG PO TABS: 10.0000 mg | ORAL_TABLET | Freq: Every day | ORAL | 0 refills | Status: DC

## 2020-01-19 NOTE — Telephone Encounter (Signed)
Patient called stating her [name] needs a PA I returned her call and advised her we are working on the [place] and we have a sample here in the office for her to take in the meantime. YL,RMA

## 2020-01-23 NOTE — Chronic Care Management (AMB) (Signed)
Care Management   Follow Up Note   01/23/2020 Name: [name] MRN: [id number] DOB: 03/14/1981  Referred by: Minette Brine, FNP Reason for referral : Care Coordination (FU RN CC Call - DM/HTN/Obesity)   [name] is a 39 y.o. year old female who is a primary care patient of Minette Brine, [place]. The CCM team was consulted for assistance with chronic disease management and care coordination needs.    Review of patient status, including review of consultants reports, relevant laboratory and other test results, and collaboration with appropriate care team members and the patient's provider was performed as part of comprehensive patient evaluation and provision of chronic care management services.    SDOH (Social Determinants of Health) assessments performed: Yes - no acute needs  See Care Plan activities for detailed interventions related to [place])   Placed outbound call to patient for a CC RN CM care plan follow up.     Outpatient Encounter Medications as of 01/19/2020  Medication Sig  . albuterol (PROVENTIL HFA;VENTOLIN HFA) 108 (90 BASE) MCG/ACT inhaler Inhale 2-3 puffs into the lungs every 6 (six) hours as needed for wheezing. Asthma/SOB  . albuterol (PROVENTIL) (5 MG/ML) 0.5% nebulizer solution Take 0.5 mLs (2.5 mg total) by nebulization every 6 (six) hours as needed for wheezing or shortness of breath.  . ALPRAZolam (XANAX) 0.5 MG tablet Take 1 tablet (0.5 mg total) by mouth 2 (two) times daily as needed for anxiety or sleep.  [name] atorvastatin (LIPITOR) 10 MG tablet Take 1 tablet (10 mg total) by mouth daily.  . Blood Glucose Monitoring Suppl (ACCU-CHEK AVIVA) device USE TO CHECK BLOOD SUGARS UP TO 4 TIMES DAILY  DX CODE:E11.9 (Patient not taking: Reported on 01/09/2020)  . buPROPion (WELLBUTRIN XL) 150 MG 24 hr tablet Take 1 tablet (150 mg total) by mouth daily.  . cetirizine (ZYRTEC ALLERGY) 10 MG tablet Take 1 tablet (10 mg total) by mouth daily.  . diclofenac (VOLTAREN) 50 MG  EC tablet Take 1 tablet (50 mg total) by mouth 3 (three) times daily.  . diclofenac Sodium (VOLTAREN) 1 % GEL Apply 2 g topically 4 (four) times daily. Rub into affected area of foot 2 to 4 times daily  . EPINEPHrine (EPIPEN 2-PAK) 0.3 mg/0.3 mL IJ SOAJ injection Inject 0.3 mLs (0.3 mg total) into the muscle once as needed (for severe allergic reaction). CAll 911 immediately if you have to use this medicine  . ferrous sulfate 325 (65 FE) MG EC tablet Take 1 tablet (325 mg total) by mouth 3 (three) times daily with meals.  . fluconazole (DIFLUCAN) 100 MG tablet Take 1 tablet (100 mg total) by mouth daily. Take 1 tablet by mouth now repeat in 5 days (Patient not taking: Reported on 01/09/2020)  . glucose blood test strip USE TO CHECK BLOOD SUGARS UP TO 4 TIMES DAILY  DX CODE:E11.9 (Patient not taking: Reported on 01/09/2020)  . insulin degludec (TRESIBA FLEXTOUCH) 100 UNIT/ML FlexTouch Pen Inject 0.25 mLs (25 Units total) into the skin daily.  . iron polysaccharides (NIFEREX) 150 MG capsule Take 1 capsule (150 mg total) by mouth daily. (Patient not taking: Reported on 11/30/2019)  . Lancets (ACCU-CHEK MULTICLIX) lancets USE TO CHECK BLOOD SUGARS UP TO 4 TIMES DAILY  DX CODE:E11.9 (Patient not taking: Reported on 01/09/2020)  . levonorgestrel (MIRENA) 20 MCG/24HR IUD 1 each by Intrauterine route once.  [name] losartan-hydrochlorothiazide (HYZAAR) 50-12.5 MG tablet Take 1 tablet by mouth daily.  . meloxicam (MOBIC) 15 MG tablet Take  1 tablet (15 mg total) by mouth daily.  . metFORMIN (GLUCOPHAGE) 1000 MG tablet Take 1 tablet (1,000 mg total) by mouth 2 (two) times daily with a meal. (Patient taking differently: Take 1,000 mg by mouth 2 (two) times daily with a meal. Patient is taking 500 mg bid with meals. Taking 1000 mg tablets causes GI pain.)  . Semaglutide, 1 MG/DOSE, (OZEMPIC, 1 MG/DOSE,) 2 MG/1.5ML SOPN Inject 1 mg into the skin once a week.  . sulfamethoxazole-trimethoprim (BACTRIM DS) 800-160 MG tablet Take  1 tablet by mouth 2 (two) times daily. (Patient not taking: Reported on 01/09/2020)   No facility-administered encounter medications on file as of 01/19/2020.     Objective:  Lab Results  Component Value Date   HGBA1C 11.2 (H) 11/30/2019   HGBA1C 7.1 (H) 08/24/2019   HGBA1C 8.9 (H) 03/29/2019   Lab Results  Component Value Date   MICROALBUR 30 03/29/2019   LDLCALC 74 11/30/2019   CREATININE 0.91 11/30/2019   BP Readings from Last 3 Encounters:  01/09/20 (!) 146/90  11/30/19 (!) 140/98  08/24/19 122/80    Goals Addressed      Patient Stated   .  "I am taking my iron but my iron is still low" (pt-stated)        Current Barriers:  [name] Knowledge Deficits related to diagnosis and treatment management for Anemia . Chronic Disease Management support and education needs related to Class 3 severe obesity; depression; uncontrolled type II DM; Essential hypertension; Mixed Hyperlipidemia   Nurse Case Manager Clinical Goal(s):  [name] Over the next 30 days, patient will work with the CCM team and PCP provider to address needs related to improved treatment management of Anemia  Goal Met . Over the next 90 days, patient will increase her Iron Saturation and Ferritin levels to a normal range of 15  CC RN CM Interventions:  04/22/19 call completed with patient   . Evaluation of current treatment plan related to Anemia and patient's adherence to plan as established by provider. . Reviewed medications with patient and discussed patient is adhering to taking her prescribed medication, Ferrous Sulfate 325 (65 Fe) mg tid as directed by PCP - patient is tolerating well and denies missed doses . Discussed plans with patient for ongoing care management follow up and provided patient with direct contact information for care management team . Discussed next PCP OV is scheduled with PCP provider Minette Brine, FNP scheduled for 08/24/19 _0 :00 AM   Patient Self Care Activities:  . Self administers medications  as prescribed . Attends all scheduled provider appointments . Calls pharmacy for medication refills . Attends church or other social activities . Performs ADL's independently . Performs IADL's independently . Calls provider office for new concerns or questions  Please see past updates related to this goal by clicking on the "Past Updates" button in the selected goal      .  "I want to weigh less than 300 pounds by the end of the year" (pt-stated)   Not on track     . CARE PLAN ENTRY . (see longitudinal plan of care for additional care plan information)  Current Barriers:  . Lacks knowledge of safe diet and exercise conducive to weight loss that accomodates health conditions . Chronic Disease Management support and education needs related to Class 3 severe obesity; depression; uncontrolled type II DM; Essential hypertension; Mixed Hyperlipidemia  . Increased stress . COVID 19 causing changes to current routines with children  Clinical Social Work  Clinical Goal(s):  . New - 07/26/19 - Over the next 90 days the patient will work with the CCM team to address weight loss goal (goal date extended due to txt delays related to COVID-19) Goal Not Met  . New 01/20/20 Over the next 90 days, patient will verbalize ways she will modify her current lifestyle in order to achieve/work toward her weight loss goal to weigh less than 300 lbs   CCM RN CM Interventions: 01/20/20 call completed with patient  . Inter-disciplinary care team collaboration (see longitudinal plan of care) . Evaluation of current treatment plan related to Weight Management and patient's adherence to plan as established by provider . Determined patient has had a 4 lb weight gain;  o Wt Readings from Last 3 Encounters: o 01/09/20 : (!) 359 lb 3.2 oz (162.9 kg) o 11/30/19 : (!) 355 lb 6.4 oz (161.2 kg) o 08/24/19 : (!) 335 lb 6.4 oz (152.1 kg)  Determined PCP started patient on Wellbutrin 150 mg 24 hr tablet dailyfor her  depression which may help with her weight loss.   Determined patient is under increased stress with school and her children as a single parent  Encouraged patient to keep the CCM team and PCP well informed of improvement/worsening symptoms of depression and or weight gain  . Discussed plans with patient for ongoing care management follow up and provided patient with direct contact information for care management team .  Patient Self Care Activities:  . Attends all scheduled provider appointments . Performs ADL's independently . Calls provider office for new concerns or questions  Please see past updates related to this goal by clicking on the "Past Updates" button in the selected goal      .  "I would like to better manage my diabetes" (pt-stated)   Not on track     . CARE PLAN ENTRY . (see longitudinal plan of care for additional care plan information)  Current Barriers:  [name] Knowledge Deficits related to diabetes disease process and Self health management  . Chronic Disease Management support and education needs related to Class 3 severe obesity; depression; uncontrolled type II DM; Essential hypertension; Mixed Hyperlipidemia  . Non-adherence to Self monitoring CBG's   Nurse Case Manager Clinical Goal(s):  . New 01/20/20 Over the next 30 days, patient will check her CBG's at home on Saturday and Sunday and record her readings to review and discuss with her health care team . New 01/20/20 Over the next 90 days, patient will work with the embedded CCM team and PCP to increase her knowledge and adherence to following her DM treatment plan in order to achieve and maintain glucose in satisfactory range FBS 80-130; <180 after meals    CC RN CM Interventions:  01/20/20 call completed with patient  . Evaluation of current treatment plan related to Diabetes and patient's adherence to plan as established by provider. . Provided education to patient re: current A1C increased to 11.2 from 7.1;  discussed target A1C of <7.0; reinforced basic disease process of diabetes; reinforced importance of strictly following an ADA diet, taking anti-glycemic medications exactly as prescribed and implementing daily exercise at least a minimal 150 minutes/week minimally as recommended by ADA  . Educated on target daily glycemic control, FBS 80-130, <180 after meals  . Determined patient admits she is not checking her sugars but believes she is aware of when she is running high or low based on how she feels; Determined she is "too busy" due to being  in school and is a single mom . Validated patient's feelings of being overwhelmed; Re-educated patient on the importance of Self checking her CBG's in order to prevent complications and monitor for effectiveness of her current DM treatment plan  . Reviewed medications with patient and discussed patients prescribed diabetic regimen;  o Metformin 1000 mg bid with meals (patient is only taking 500 mg bid due to experiencing abdominal pain with larger dose); Ozempic 2 mg/1.5 mL, inject 1 mg weekly; Tresiba 100u/ml, inject 0.25 qd . Advised patient, providing education and rationale, to check cbg before meals on Saturday and Sunday and record, calling the CCM team and or MD for findings outside established parameters; patient is agreeable to trying to be consistent with Self checking CBG on the weekends in order to assess for effectiveness of current regimen and to ensure DM is well controlled to help prevent complications . Mailed printed patient educational materials related to Diabetes Care Schedule; Grocery Shopping: Living Well with Diabetes; Prevent Complications: Living with Diabetes   . Discussed plans with patient for ongoing care management follow up and provided patient with direct contact information for care management team  Patient Self Care Activities:  . Self administers medications as prescribed . Attends all scheduled provider appointments . Calls  pharmacy for medication refills . Attends church or other social activities . Performs ADL's independently . Performs IADL's independently . Calls provider office for new concerns or questions  Please see past updates related to this goal by clicking on the "Past Updates" button in the selected goal      .  "to keep my BP better controlled" (pt-stated)   Not on track     . CARE PLAN ENTRY . (see longitudinal plan of care for additional care plan information)  Current Barriers:  [name] Knowledge Deficits related to disease process and Self Health management of Hypertension  . Chronic Disease Management support and education needs related to Essential Hypertension; Class 3 severe obesity; type 2 Diabetes Mellitus; Mixed hyperlipidemia    Nurse Case Manager Clinical Goal(s):  . 01/20/20 New Over the next 90 days, patient will demonstrate improved adherence to prescribed treatment plan for Hypertension as evidenced bypatient will adhere to taking all medications w/o missed doses and will continue to Self monitor her BP at home, reporting abnormal readings to the CCM team and or PCP provider  . 01/20/20 New Over the next 90 days, patient will verbalize factors that may contribute to high blood pressure and make at least one lifestyle modification to help lower her BP   CCM RN CM Interventions:  01/20/20 call completed with patient  . Evaluation of current treatment plan related to Hypertension and patient's adherence to plan as established by provider . 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 Reviewed and discussed last PCP OV, BP was elevated to 146/90 with HR 105 o BP Readings from Last 3 Encounters: o  01/09/20 o (!) 146/90 o  11/30/19 o (!) 140/98 o  08/24/19 o 122/80   . Reviewed medications with patient and discussed patient is adhering to taking her Hyzaar 50-12.5 mg  every day, she denies missed doses . Educated on importance of taking medication exactly as prescribed w/o missing doses for best effectiveness and decreased risk for rebound hypertensive crisis . Educated on techniques to reduce stress such as meditation, deep  breathing and taking time for relaxation, asking for help when needed, drinking plenty of water and getting plenty of sleep   . Advised patient, providing education and rationale, to monitor blood pressure daily and record, calling the CCM team and or PCP provider  for findings outside established parameters . Discussed plans with patient for ongoing care management follow up and provided patient with direct contact information for care management team  Patient Self Care Activities:  . Self administers medications as prescribed . Attends all scheduled provider appointments . Calls pharmacy for medication refills . Performs ADL's independently . Performs IADL's independently . Calls provider office for new concerns or questions  Please see past updates related to this goal by clicking on the "Past Updates" button in the selected goal         Plan:   Telephone follow up appointment with care management team member scheduled for: 03/06/20  Barb Merino, RN, BSN, CCM Care Management Coordinator [facility]  Direct Phone: [phone]

## 2020-01-23 NOTE — Patient Instructions (Signed)
Visit Information  Goals Addressed      Patient Stated   .  "I am taking my iron but my iron is still low" (pt-stated)        Current Barriers:  [name] Knowledge Deficits related to diagnosis and treatment management for Anemia . Chronic Disease Management support and education needs related to Class 3 severe obesity; depression; uncontrolled type II DM; Essential hypertension; Mixed Hyperlipidemia   Nurse Case Manager Clinical Goal(s):  [name] Over the next 30 days, patient will work with the CCM team and PCP provider to address needs related to improved treatment management of Anemia  Goal Met . Over the next 90 days, patient will increase her Iron Saturation and Ferritin levels to a normal range of 15  CC RN CM Interventions:  04/22/19 call completed with patient   . Evaluation of current treatment plan related to Anemia and patient's adherence to plan as established by provider. . Reviewed medications with patient and discussed patient is adhering to taking her prescribed medication, Ferrous Sulfate 325 (65 Fe) mg tid as directed by PCP - patient is tolerating well and denies missed doses . Discussed plans with patient for ongoing care management follow up and provided patient with direct contact information for care management team . Discussed next PCP OV is scheduled with PCP provider Minette Brine, FNP scheduled for 08/24/19 _0 :00 AM   Patient Self Care Activities:  . Self administers medications as prescribed . Attends all scheduled provider appointments . Calls pharmacy for medication refills . Attends church or other social activities . Performs ADL's independently . Performs IADL's independently . Calls provider office for new concerns or questions  Please see past updates related to this goal by clicking on the "Past Updates" button in the selected goal      .  "I want to weigh less than 300 pounds by the end of the year" (pt-stated)   Not on track     . CARE PLAN ENTRY . (see  longitudinal plan of care for additional care plan information)  Current Barriers:  . Lacks knowledge of safe diet and exercise conducive to weight loss that accomodates health conditions . Chronic Disease Management support and education needs related to Class 3 severe obesity; depression; uncontrolled type II DM; Essential hypertension; Mixed Hyperlipidemia  . Increased stress . COVID 19 causing changes to current routines with children  Clinical Social Work Clinical Goal(s):  . New - 07/26/19 - Over the next 90 days the patient will work with the CCM team to address weight loss goal (goal date extended due to txt delays related to COVID-19) Goal Not Met  . New 01/20/20 Over the next 90 days, patient will verbalize ways she will modify her current lifestyle in order to achieve/work toward her weight loss goal to weigh less than 300 lbs   CCM RN CM Interventions: 01/20/20 call completed with patient  . Inter-disciplinary care team collaboration (see longitudinal plan of care) . Evaluation of current treatment plan related to Weight Management and patient's adherence to plan as established by provider . Determined patient has had a 4 lb weight gain;  o Wt Readings from Last 3 Encounters: o 01/09/20 : (!) 359 lb 3.2 oz (162.9 kg) o 11/30/19 : (!) 355 lb 6.4 oz (161.2 kg) o 08/24/19 : (!) 335 lb 6.4 oz (152.1 kg)  Determined PCP started patient on Wellbutrin 150 mg 24 hr tablet dailyfor her depression which may help with her weight loss.   Determined  patient is under increased stress with school and her children as a single parent  Encouraged patient to keep the CCM team and PCP well informed of improvement/worsening symptoms of depression and or weight gain  . Discussed plans with patient for ongoing care management follow up and provided patient with direct contact information for care management team .  Patient Self Care Activities:  . Attends all scheduled provider  appointments . Performs ADL's independently . Calls provider office for new concerns or questions  Please see past updates related to this goal by clicking on the "Past Updates" button in the selected goal      .  "I would like to better manage my diabetes" (pt-stated)   Not on track     . CARE PLAN ENTRY . (see longitudinal plan of care for additional care plan information)  Current Barriers:  [name] Knowledge Deficits related to diabetes disease process and Self health management  . Chronic Disease Management support and education needs related to Class 3 severe obesity; depression; uncontrolled type II DM; Essential hypertension; Mixed Hyperlipidemia  . Non-adherence to Self monitoring CBG's   Nurse Case Manager Clinical Goal(s):  . New 01/20/20 Over the next 30 days, patient will check her CBG's at home on Saturday and Sunday and record her readings to review and discuss with her health care team . New 01/20/20 Over the next 90 days, patient will work with the embedded CCM team and PCP to increase her knowledge and adherence to following her DM treatment plan in order to achieve and maintain glucose in satisfactory range FBS 80-130; <180 after meals    CC RN CM Interventions:  01/20/20 call completed with patient  . Evaluation of current treatment plan related to Diabetes and patient's adherence to plan as established by provider. . Provided education to patient re: current A1C increased to 11.2 from 7.1; discussed target A1C of <7.0; reinforced basic disease process of diabetes; reinforced importance of strictly following an ADA diet, taking anti-glycemic medications exactly as prescribed and implementing daily exercise at least a minimal 150 minutes/week minimally as recommended by ADA  . Educated on target daily glycemic control, FBS 80-130, <180 after meals  . Determined patient admits she is not checking her sugars but believes she is aware of when she is running high or low based on how  she feels; Determined she is "too busy" due to being in school and is a single mom . Validated patient's feelings of being overwhelmed; Re-educated patient on the importance of Self checking her CBG's in order to prevent complications and monitor for effectiveness of her current DM treatment plan  . Reviewed medications with patient and discussed patients prescribed diabetic regimen;  o Metformin 1000 mg bid with meals (patient is only taking 500 mg bid due to experiencing abdominal pain with larger dose); Ozempic 2 mg/1.5 mL, inject 1 mg weekly; Tresiba 100u/ml, inject 0.25 qd . Advised patient, providing education and rationale, to check cbg before meals on Saturday and Sunday and record, calling the CCM team and or MD for findings outside established parameters; patient is agreeable to trying to be consistent with Self checking CBG on the weekends in order to assess for effectiveness of current regimen and to ensure DM is well controlled to help prevent complications . Mailed printed patient educational materials related to Diabetes Care Schedule; Grocery Shopping: Living Well with Diabetes; Prevent Complications: Living with Diabetes   . Discussed plans with patient for ongoing care management follow up  and provided patient with direct contact information for care management team  Patient Self Care Activities:  . Self administers medications as prescribed . Attends all scheduled provider appointments . Calls pharmacy for medication refills . Attends church or other social activities . Performs ADL's independently . Performs IADL's independently . Calls provider office for new concerns or questions  Please see past updates related to this goal by clicking on the "Past Updates" button in the selected goal      .  "to keep my BP better controlled" (pt-stated)   Not on track     . CARE PLAN ENTRY . (see longitudinal plan of care for additional care plan information)  Current Barriers:   [name] Knowledge Deficits related to disease process and Self Health management of Hypertension  . Chronic Disease Management support and education needs related to Essential Hypertension; Class 3 severe obesity; type 2 Diabetes Mellitus; Mixed hyperlipidemia    Nurse Case Manager Clinical Goal(s):  . 01/20/20 New Over the next 90 days, patient will demonstrate improved adherence to prescribed treatment plan for Hypertension as evidenced bypatient will adhere to taking all medications w/o missed doses and will continue to Self monitor her BP at home, reporting abnormal readings to the CCM team and or PCP provider  . 01/20/20 New Over the next 90 days, patient will verbalize factors that may contribute to high blood pressure and make at least one lifestyle modification to help lower her BP   CCM RN CM Interventions:  01/20/20 call completed with patient  . Evaluation of current treatment plan related to Hypertension and patient's adherence to plan as established by provider . 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 Reviewed and discussed last PCP OV, BP was elevated to 146/90 with HR 105 o BP Readings from Last 3 Encounters: o  01/09/20 o (!) 146/90 o  11/30/19 o (!) 140/98 o  08/24/19 o 122/80   . Reviewed medications with patient and discussed patient is adhering to taking her Hyzaar 50-12.5 mg every day, she denies missed doses . Educated on importance of taking medication exactly as prescribed w/o missing doses for best effectiveness and decreased risk for rebound hypertensive crisis . Educated on techniques to reduce stress such as meditation, deep breathing and taking time for relaxation, asking for help when needed, drinking plenty of water and getting plenty of sleep   . Advised patient, providing education and rationale, to monitor blood pressure daily and record,  calling the CCM team and or PCP provider  for findings outside established parameters . Discussed plans with patient for ongoing care management follow up and provided patient with direct contact information for care management team  Patient Self Care Activities:  . Self administers medications as prescribed . Attends all scheduled provider appointments . Calls pharmacy for medication refills . Performs ADL's independently . Performs IADL's independently . Calls provider office for new concerns or questions  Please see past updates related to this goal by clicking on the "Past Updates" button in the selected goal        Patient verbalizes understanding of instructions provided today.   Telephone follow up appointment with care management team member scheduled for: 03/06/20  Barb Merino, RN, BSN, CCM Care Management Coordinator [facility]  Direct Phone: [phone]

## 2020-01-24 ENCOUNTER — Other Ambulatory Visit: Payer: Self-pay

## 2020-01-24 ENCOUNTER — Ambulatory Visit (INDEPENDENT_AMBULATORY_CARE_PROVIDER_SITE_OTHER): Payer: Medicaid Other | Admitting: Podiatry

## 2020-01-24 DIAGNOSIS — Z794 Long term (current) use of insulin: Secondary | ICD-10-CM

## 2020-01-24 DIAGNOSIS — L601 Onycholysis: Secondary | ICD-10-CM

## 2020-01-24 DIAGNOSIS — T148XXA Other injury of unspecified body region, initial encounter: Secondary | ICD-10-CM

## 2020-01-24 DIAGNOSIS — E119 Type 2 diabetes mellitus without complications: Secondary | ICD-10-CM

## 2020-01-29 NOTE — Progress Notes (Signed)
Subjective: 39 year old female presents the office today for Evaluation after having left total nail avulsion performed.  She did have a reaction to Bactrim.  She has been soaking Epson salts, antibiotic ointment and a bandage.  She thinks she may have cut the bandage on too tight and she developed a blister on the side of her big toe.  Denies any drainage or pus. Denies any systemic complaints such as fevers, chills, nausea, vomiting. No acute changes since last appointment, and no other complaints at this time.   Objective: AAO x3, NAD DP/PT pulses palpable bilaterally, CRT less than 3 seconds Status post total nail avulsion left hallux with scab present and small meta granulation tissue.  On the medial hallux IPJ on the area the previous callus.  The old blister that has dried.  Is able to debride this well a small amount of dried blood was identified no drainage or pus or any fluid.  There is no edema, erythema any signs of infection noted today to the toe.   No open lesions or pre-ulcerative lesions.  No pain with calf compression, swelling, warmth, erythema  Assessment: Status post total nail avulsion; hyperkeratotic lesion/blister medial hallux  Plan: -All treatment options discussed with the patient including all alternatives, risks, complications.  -Debrided the hyperkeratotic tissue and complications or bleeding.  Recommend antibiotic ointment to both areas of the callus as well as the nail bed daily.  Can leave the area open at nighttime.  Monitoring signs or symptoms of infection recurrence. -Patient encouraged to call the office with any questions, concerns, change in symptoms.   No follow-ups on file.  Trula Slade DPM

## 2020-02-20 ENCOUNTER — Ambulatory Visit: Payer: Medicaid Other | Admitting: Nurse Practitioner

## 2020-02-28 ENCOUNTER — Other Ambulatory Visit: Payer: Self-pay

## 2020-02-28 ENCOUNTER — Ambulatory Visit: Payer: Medicaid Other | Admitting: Podiatry

## 2020-02-28 ENCOUNTER — Encounter: Payer: Self-pay | Admitting: Podiatry

## 2020-02-28 DIAGNOSIS — L601 Onycholysis: Secondary | ICD-10-CM

## 2020-02-28 DIAGNOSIS — Z794 Long term (current) use of insulin: Secondary | ICD-10-CM

## 2020-02-28 DIAGNOSIS — E119 Type 2 diabetes mellitus without complications: Secondary | ICD-10-CM | POA: Diagnosis not present

## 2020-02-28 DIAGNOSIS — T148XXA Other injury of unspecified body region, initial encounter: Secondary | ICD-10-CM

## 2020-02-29 ENCOUNTER — Encounter: Payer: Self-pay | Admitting: Nurse Practitioner

## 2020-02-29 ENCOUNTER — Ambulatory Visit: Payer: Medicaid Other | Admitting: Nurse Practitioner

## 2020-02-29 VITALS — BP 136/90 | HR 99 | Temp 98.6°F | Ht 63.6 in | Wt 356.6 lb

## 2020-02-29 DIAGNOSIS — I10 Essential (primary) hypertension: Secondary | ICD-10-CM

## 2020-02-29 DIAGNOSIS — F32A Depression, unspecified: Secondary | ICD-10-CM

## 2020-02-29 DIAGNOSIS — Z6841 Body Mass Index (BMI) 40.0 and over, adult: Secondary | ICD-10-CM

## 2020-02-29 DIAGNOSIS — E1165 Type 2 diabetes mellitus with hyperglycemia: Secondary | ICD-10-CM

## 2020-02-29 DIAGNOSIS — R519 Headache, unspecified: Secondary | ICD-10-CM | POA: Diagnosis not present

## 2020-02-29 DIAGNOSIS — F329 Major depressive disorder, single episode, unspecified: Secondary | ICD-10-CM | POA: Diagnosis not present

## 2020-02-29 DIAGNOSIS — F419 Anxiety disorder, unspecified: Secondary | ICD-10-CM

## 2020-02-29 DIAGNOSIS — Z139 Encounter for screening, unspecified: Secondary | ICD-10-CM | POA: Diagnosis not present

## 2020-02-29 DIAGNOSIS — Z1159 Encounter for screening for other viral diseases: Secondary | ICD-10-CM

## 2020-02-29 MED ORDER — BUPROPION HCL ER (XL) 300 MG PO TB24: 300.0000 mg | ORAL_TABLET | Freq: Every day | ORAL | 1 refills | Status: DC

## 2020-02-29 MED ORDER — BUPROPION HCL ER (XL) 300 MG PO TB24: 300.0000 mg | ORAL_TABLET | Freq: Every day | ORAL | 2 refills | Status: DC

## 2020-02-29 MED ORDER — OZEMPIC (1 MG/DOSE) 4 MG/3ML ~~LOC~~ SOPN: 1.0000 mg | PEN_INJECTOR | SUBCUTANEOUS | 1 refills | Status: DC

## 2020-02-29 MED ORDER — METFORMIN HCL ER 750 MG PO TB24: 750.0000 mg | ORAL_TABLET | Freq: Two times a day (BID) | ORAL | 1 refills | Status: DC

## 2020-02-29 MED ORDER — TRESIBA FLEXTOUCH 100 UNIT/ML ~~LOC~~ SOPN: 35.0000 [IU] | PEN_INJECTOR | Freq: Every day | SUBCUTANEOUS | 5 refills | Status: DC

## 2020-02-29 MED ORDER — ATORVASTATIN CALCIUM 10 MG PO TABS: 10.0000 mg | ORAL_TABLET | Freq: Every day | ORAL | 1 refills | Status: DC

## 2020-02-29 MED ORDER — PROPRANOLOL HCL 10 MG PO TABS: 10.0000 mg | ORAL_TABLET | Freq: Every evening | ORAL | 1 refills | Status: DC

## 2020-02-29 NOTE — Progress Notes (Signed)
This visit occurred during the SARS-CoV-2 public health emergency.  Safety protocols were in place, including screening questions prior to the visit, additional usage of staff PPE, and extensive cleaning of exam room while observing appropriate contact time as indicated for disinfecting solutions.  Subjective:     Patient ID: [name] , female    DOB: 06-10-1981 , 39 y.o.   MRN: [id number]   Chief Complaint  Patient presents with  . 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   She continues to do the Ozempic 1 mg weekly and she has increased her tresiba to 35 units daily on her own.   She is also on Wellbutrin    Hypertension This is a chronic problem. The current episode started more than 1 year ago. The problem has been gradually worsening since onset. The problem is uncontrolled. Pertinent negatives include no chest pain, headaches or palpitations. Risk factors for coronary artery disease include obesity and sedentary lifestyle.  Diabetes She presents for her follow-up diabetic visit. She has type 2 diabetes mellitus. Her disease course has been worsening. Hypoglycemia symptoms include nervousness/anxiousness. Pertinent negatives for hypoglycemia include no dizziness or headaches. Pertinent negatives for diabetes include no chest pain and no fatigue. There are no hypoglycemic complications. There are no diabetic complications. Risk factors for coronary artery disease include sedentary lifestyle, obesity, stress, hypertension and diabetes mellitus. Current diabetic treatment includes oral agent (monotherapy). She is compliant with treatment some of the time. When asked about meal planning, she reported none. She has not had a previous visit with a dietitian. (Blood sugars are up to 250-260.  )     Past Medical History:  Diagnosis Date  . Abnormal Pap smear    f/u wnl  . Asthma   . Fibroid   . Gestational diabetes   . Hypertension   .  Morbid obesity ([place])   . Sleep apnea   . Urinary tract infection      Family History  Problem Relation Age of Onset  . Diabetes Father   . Allergies Mother   . Asthma Mother   . Hypertension Mother   . Diabetes Mother   . Diabetes Maternal Grandmother   . Heart disease Maternal Grandmother   . Anesthesia problems Neg Hx   . Hypotension Neg Hx   . Malignant hyperthermia Neg Hx   . Pseudochol deficiency Neg Hx   . Hearing loss Neg Hx      Current Outpatient Medications:  .  albuterol (PROVENTIL HFA;VENTOLIN HFA) 108 (90 BASE) MCG/ACT inhaler, Inhale 2-3 puffs into the lungs every 6 (six) hours as needed for wheezing. Asthma/SOB, Disp: , Rfl:  .  albuterol (PROVENTIL) (5 MG/ML) 0.5% nebulizer solution, Take 0.5 mLs (2.5 mg total) by nebulization every 6 (six) hours as needed for wheezing or shortness of breath., Disp: 20 mL, Rfl: 12 .  atorvastatin (LIPITOR) 10 MG tablet, Take 1 tablet (10 mg total) by mouth daily., Disp: 90 tablet, Rfl: 0 .  Blood Glucose Monitoring Suppl (ACCU-CHEK AVIVA) device, USE TO CHECK BLOOD SUGARS UP TO 4 TIMES DAILY  DX CODE:E11.9, Disp: 1 each, Rfl: 0 .  buPROPion (WELLBUTRIN XL) 300 MG 24 hr tablet, Take 1 tablet (300 mg total) by mouth daily., Disp: 30 tablet, Rfl: 2 .  cetirizine (ZYRTEC ALLERGY) 10 MG tablet, Take 1 tablet (10 mg total) by mouth daily., Disp: 90 tablet, Rfl: 0 .  ferrous sulfate 325 (  65 FE) MG EC tablet, Take 1 tablet (325 mg total) by mouth 3 (three) times daily with meals., Disp: 90 tablet, Rfl: 2 .  glucose blood test strip, USE TO CHECK BLOOD SUGARS UP TO 4 TIMES DAILY  DX CODE:E11.9, Disp: 100 each, Rfl: 12 .  insulin degludec (TRESIBA FLEXTOUCH) 100 UNIT/ML FlexTouch Pen, Inject 0.25 mLs (25 Units total) into the skin daily., Disp: 3 mL, Rfl: 1 .  Lancets (ACCU-CHEK MULTICLIX) lancets, USE TO CHECK BLOOD SUGARS UP TO 4 TIMES DAILY  DX CODE:E11.9, Disp: 100 each, Rfl: 12 .  levonorgestrel (MIRENA) 20 MCG/24HR IUD, 1 each by  Intrauterine route once., Disp: , Rfl:  .  losartan-hydrochlorothiazide (HYZAAR) 50-12.5 MG tablet, Take 1 tablet by mouth daily., Disp: 90 tablet, Rfl: 0 .  ALPRAZolam (XANAX) 0.5 MG tablet, Take 1 tablet (0.5 mg total) by mouth 2 (two) times daily as needed for anxiety or sleep. (Patient not taking: Reported on 02/29/2020), Disp: 20 tablet, Rfl: 0 .  diclofenac (VOLTAREN) 50 MG EC tablet, Take 1 tablet (50 mg total) by mouth 3 (three) times daily. (Patient not taking: Reported on 02/29/2020), Disp: 45 tablet, Rfl: 3 .  diclofenac Sodium (VOLTAREN) 1 % GEL, Apply 2 g topically 4 (four) times daily. Rub into affected area of foot 2 to 4 times daily (Patient not taking: Reported on 02/29/2020), Disp: 100 g, Rfl: 2 .  EPINEPHrine (EPIPEN 2-PAK) 0.3 mg/0.3 mL IJ SOAJ injection, Inject 0.3 mLs (0.3 mg total) into the muscle once as needed (for severe allergic reaction). CAll 911 immediately if you have to use this medicine (Patient not taking: Reported on 02/29/2020), Disp: 1 each, Rfl: 1   Allergies  Allergen Reactions  . Other Anaphylaxis and Other (See Comments)    Pt has fish allergy Pt has allergy to butter cookies  . Penicillins Rash and Other (See Comments)    Told MD that PCN reaction is rash and that she has taken amoxicillin before  . Bactrim [Sulfamethoxazole-Trimethoprim]     Diarrhea, abdominal cramps and rash     Review of Systems  Constitutional: Negative.  Negative for fatigue.  Respiratory: Negative.   Cardiovascular: Negative.  Negative for chest pain, palpitations and leg swelling.  Neurological: Negative for dizziness and headaches.  Psychiatric/Behavioral: The patient is nervous/anxious.      Today's Vitals   02/29/20 1652  BP: (!) 136/90  Pulse: 99  Temp: 98.6 F (37 C)  TempSrc: Oral  Weight: (!) 356 lb 9.6 oz (161.8 kg)  Height: 5' 3.6" (1.615 m)  PainSc: 6   PainLoc: Head   Body mass index is 61.98 kg/m.  Wt Readings from Last 3 Encounters:  02/29/20 (!) 356  lb 9.6 oz (161.8 kg)  01/09/20 (!) 359 lb 3.2 oz (162.9 kg)  11/30/19 (!) 355 lb 6.4 oz (161.2 kg)    Objective:  Physical Exam Constitutional:      General: She is not in acute distress.    Appearance: Normal appearance. She is obese.  Cardiovascular:     Rate and Rhythm: Normal rate and regular rhythm.     Pulses: Normal pulses.     Heart sounds: Normal heart sounds. No murmur heard.   Pulmonary:     Effort: Pulmonary effort is normal. No respiratory distress.     Breath sounds: Normal breath sounds.  Skin:    General: Skin is warm and dry.     Capillary Refill: Capillary refill takes less than 2 seconds.  Neurological:  General: No focal deficit present.     Mental Status: She is alert and oriented to person, place, and time.     Cranial Nerves: No cranial nerve deficit.  Psychiatric:        Mood and Affect: Mood normal.        Behavior: Behavior normal.        Thought Content: Thought content normal.        Judgment: Judgment normal.         Assessment And Plan:     1. Uncontrolled type 2 diabetes mellitus with hyperglycemia (HCC) Chronic, I have explained to her once again the importance of limiting her stress and to eat healthy due to poorly controlled diabetes.  She was also advised to not change her insulin unless she has communicated with a healthcare provider to make sure it is the correct dose.   - Hemoglobin A1c - CMP14+EGFR - Lipid panel - atorvastatin (LIPITOR) 10 MG tablet; Take 1 tablet (10 mg total) by mouth at bedtime.  Dispense: 90 tablet; Refill: 1 - insulin degludec (TRESIBA FLEXTOUCH) 100 UNIT/ML FlexTouch Pen; Inject 0.35 mLs (35 Units total) into the skin daily.  Dispense: 5 pen; Refill: 5 - Semaglutide, 1 MG/DOSE, (OZEMPIC, 1 MG/DOSE,) 4 MG/3ML SOPN; Inject 0.75 mLs (1 mg total) into the skin once a week.  Dispense: 9 mL; Refill: 1  2. Essential hypertension Chronic, fair control Continue to avoid high salt foods and eating out.  -  CMP14+EGFR - propranolol (INDERAL) 10 MG tablet; Take 1 tablet (10 mg total) by mouth every evening.  Dispense: 30 tablet; Refill: 1  3. Class 3 severe obesity due to excess calories with serious comorbidity and body mass index (BMI) of 60.0 to 69.9 in adult [facility]) She is not losing weight as we had hoped with taking the Ozempic for her diabetes. This is likely related to stress and the fact she is on insulin. - buPROPion (WELLBUTRIN XL) 300 MG 24 hr tablet; Take 1 tablet (300 mg total) by mouth at bedtime.  Dispense: 90 tablet; Refill: 1  4. Depression, unspecified depression type  She does not feel the previous dose was effective, will increase to 300 mg daily - buPROPion (WELLBUTRIN XL) 300 MG 24 hr tablet; Take 1 tablet (300 mg total) by mouth at bedtime.  Dispense: 90 tablet; Refill: 1  5. Persistent headaches  Will try propanolol scheduled these are resembling migraines - propranolol (INDERAL) 10 MG tablet; Take 1 tablet (10 mg total) by mouth every evening.  Dispense: 30 tablet; Refill: 1  6. Anxiety  The wellbutrin will help with her anxiety and it may help as a side effect with her anxiety - propranolol (INDERAL) 10 MG tablet; Take 1 tablet (10 mg total) by mouth every evening.  Dispense: 30 tablet; Refill: 1  7. Encounter for hepatitis C screening test for low risk patient  Will check Hepatitis C screening due to recent recommendations to screen all adults 18 years and older - Hepatitis C antibody  8. Encounter for screening  - Measles/Mumps/Rubella Immunity  9. BMI 60.0-69.9, adult [facility])  She is encouraged to strive for BMI less than 50 to stare decreasing cardiac risk. Advised to aim for at least 150 minutes of exercise per week. Encouraged to park further when at the store, take stairs instead of elevators and to walk in place during commercials. Increase water intake to at least one gallon of water daily.   Minette Brine, FNP    THE PATIENT  IS ENCOURAGED TO  PRACTICE SOCIAL DISTANCING DUE TO THE COVID-19 PANDEMIC.

## 2020-03-01 ENCOUNTER — Other Ambulatory Visit: Payer: Self-pay

## 2020-03-01 ENCOUNTER — Other Ambulatory Visit: Payer: Self-pay | Admitting: Nurse Practitioner

## 2020-03-01 ENCOUNTER — Telehealth: Payer: Self-pay

## 2020-03-01 LAB — CMP14+EGFR
ALT: 85 IU/L — ABNORMAL HIGH (ref 0–32)
AST: 71 IU/L — ABNORMAL HIGH (ref 0–40)
Albumin/Globulin Ratio: 1.4 (ref 1.2–2.2)
Albumin: 4.2 g/dL (ref 3.8–4.8)
Alkaline Phosphatase: 71 IU/L (ref 48–121)
BUN/Creatinine Ratio: 11 (ref 9–23)
BUN: 9 mg/dL (ref 6–20)
Bilirubin Total: 0.4 mg/dL (ref 0.0–1.2)
CO2: 24 mmol/L (ref 20–29)
Calcium: 9.6 mg/dL (ref 8.7–10.2)
Chloride: 101 mmol/L (ref 96–106)
Creatinine, Ser: 0.8 mg/dL (ref 0.57–1.00)
GFR calc Af Amer: 108 mL/min/{1.73_m2} (ref >59)
GFR calc non Af Amer: 94 mL/min/{1.73_m2} (ref >59)
Globulin, Total: 3 g/dL (ref 1.5–4.5)
Glucose: 276 mg/dL — ABNORMAL HIGH (ref 65–99)
Potassium: 4.4 mmol/L (ref 3.5–5.2)
Sodium: 141 mmol/L (ref 134–144)
Total Protein: 7.2 g/dL (ref 6.0–8.5)

## 2020-03-01 LAB — LIPID PANEL
Chol/HDL Ratio: 4 ratio (ref 0.0–4.4)
Cholesterol, Total: 156 mg/dL (ref 100–199)
HDL: 39 mg/dL — ABNORMAL LOW (ref >39)
LDL Chol Calc (NIH): 80 mg/dL (ref 0–99)
Triglycerides: 223 mg/dL — ABNORMAL HIGH (ref 0–149)
VLDL Cholesterol Cal: 37 mg/dL (ref 5–40)

## 2020-03-01 LAB — MEASLES/MUMPS/RUBELLA IMMUNITY
MUMPS ABS, IGG: 166 AU/mL (ref >10.9)
RUBEOLA AB, IGG: 20.2 AU/mL (ref >16.4)
Rubella Antibodies, IGG: 1.24 index (ref >0.99)

## 2020-03-01 LAB — HEPATITIS C ANTIBODY: Hep C Virus Ab: <0.1 s/co ratio (ref 0.0–0.9)

## 2020-03-01 LAB — HEMOGLOBIN A1C
Est. average glucose Bld gHb Est-mCnc: 286 mg/dL
Hgb A1c MFr Bld: 11.6 % — ABNORMAL HIGH (ref 4.8–5.6)

## 2020-03-01 MED ORDER — DAPAGLIFLOZIN PROPANEDIOL 5 MG PO TABS
5.0000 mg | ORAL_TABLET | Freq: Every day | ORAL | 2 refills | Status: DC
Start: 2020-03-01 — End: 2020-05-08

## 2020-03-01 NOTE — Telephone Encounter (Signed)
I called patient and notified her that she can come pick up samples of tresiba and ozempic while we work on her PA for both. YL,RMA

## 2020-03-03 NOTE — Progress Notes (Signed)
Subjective: 39 year old female presents the office today for follow-up evaluation of left hallux toenail avulsion as well as for blister/callus to medial hallux.  She states that she is doing much better.  Denies any drainage or pus or any swelling or pain. Denies any drainage or pus. Denies any systemic complaints such as fevers, chills, nausea, vomiting. No acute changes since last appointment, and no other complaints at this time.   Objective: AAO x3, NAD DP/PT pulses palpable bilaterally, CRT less than 3 seconds Status post total nail avulsion left hallux and the nailbed is healed.  There was some scab that she recently had removed but there is no open sore and there is no drainage or pus.  Hyperkeratotic lesion medial hallux IPJ without any underlying ulceration drainage or signs of infection. No open lesions or pre-ulcerative lesions.  No pain with calf compression, swelling, warmth, erythema  Assessment: Status post total nail avulsion; hyperkeratotic lesion  Plan: -All treatment options discussed with the patient including all alternatives, risks, complications.  -Debrided hyperkeratotic lesion with any complications or bleeding.  Recommend moisturizer daily and offloading.  Nailbed site is healed.  Return if symptoms worsen or fail to improve.  Trula Slade DPM

## 2020-03-06 ENCOUNTER — Other Ambulatory Visit: Payer: Self-pay

## 2020-03-06 ENCOUNTER — Ambulatory Visit: Payer: Self-pay

## 2020-03-06 ENCOUNTER — Telehealth: Payer: Medicaid Other

## 2020-03-06 DIAGNOSIS — I10 Essential (primary) hypertension: Secondary | ICD-10-CM

## 2020-03-06 DIAGNOSIS — E1165 Type 2 diabetes mellitus with hyperglycemia: Secondary | ICD-10-CM

## 2020-03-06 DIAGNOSIS — Z6841 Body Mass Index (BMI) 40.0 and over, adult: Secondary | ICD-10-CM

## 2020-03-07 NOTE — Patient Instructions (Signed)
Visit Information  Goals Addressed      Patient Stated   .  "I would like to better manage my diabetes" (pt-stated)        . CARE PLAN ENTRY . (see longitudinal plan of care for additional care plan information)  Current Barriers:  [name] Knowledge Deficits related to diabetes disease process and Self health management  . Chronic Disease Management support and education needs related to Class 3 severe obesity; depression; uncontrolled type II DM; Essential hypertension; Mixed Hyperlipidemia  . Non-adherence to Self monitoring CBG's   Nurse Case Manager Clinical Goal(s):  . New 01/20/20 Over the next 30 days, patient will check her CBG's at home on Saturday and Sunday and record her readings to review and discuss with her health care team . New 01/20/20 Over the next 90 days, patient will work with the embedded CCM team and PCP to increase her knowledge and adherence to following her DM treatment plan in order to achieve and maintain glucose in satisfactory range FBS 80-130; <180 after meals   CC RN CM Interventions:  03/06/20 call completed with patient  . Evaluation of current treatment plan related to Diabetes and patient's adherence to plan as established by provider . Provided education to patient re: current A1C increased to 11.6 % obtained on 7/721; discussed target A1C of <7.0; reinforced basic disease process of diabetes; reinforced importance of strictly following an ADA diet, taking anti-glycemic medications exactly as prescribed and implementing daily exercise at least a minimal 150 minutes/week minimally as recommended by ADA  . Re-educated on target daily glycemic control, FBS 80-130, <180 after meals  . Determined patient has started Self checking her CBG's at home and recording her readings, discussed she is more motivated to get her DM under control  . Reviewed medications with patient and discussed patients prescribed diabetic regimen;  o Metformin 1000 mg bid with meals (patient is  only taking 500 mg bid due to experiencing abdominal pain with larger dose); Ozempic 2 mg/1.5 mL, inject 1 mg weekly; Tresiba 100u/ml, inject 0.25 qd . Determined patient ran out of her DM medications, PCP is aware and will provide samples; Determined patient will pick up samples today  . Educated patient on importance of adhering to her prescribed medication regimen as directed, discussed importance of refilling medications when supply is getting low 7-14 days prior to running out to help avoid missed doses of medication  . Advised patient, providing education and rationale, to check cbg before meals 1-2 times daily and record, calling the CCM team and or MD for findings outside established parameters; patient is agreeable to trying to be consistent with Self checking CBG on the weekends in order to assess for effectiveness of current regimen and to ensure DM is well controlled to help prevent complications . Discussed plans with patient for ongoing care management follow up and provided patient with direct contact information for care management team  Patient Self Care Activities:  . Self administers medications as prescribed . Attends all scheduled provider appointments . Calls pharmacy for medication refills . Attends church or other social activities . Performs ADL's independently . Performs IADL's independently . Calls provider office for new concerns or questions  Please see past updates related to this goal by clicking on the "Past Updates" button in the selected goal      .  "to keep my BP better controlled" (pt-stated)        . CARE PLAN ENTRY . (see longitudinal plan of  care for additional care plan information)  Current Barriers:  [name] Knowledge Deficits related to disease process and Self Health management of Hypertension  . Chronic Disease Management support and education needs related to Essential Hypertension; Class 3 severe obesity; type 2 Diabetes Mellitus; Mixed hyperlipidemia     Nurse Case Manager Clinical Goal(s):  . 01/20/20 New Over the next 90 days, patient will demonstrate improved adherence to prescribed treatment plan for Hypertension as evidenced bypatient will adhere to taking all medications w/o missed doses and will continue to Self monitor her BP at home, reporting abnormal readings to the CCM team and or PCP provider  . 01/20/20 New Over the next 90 days, patient will verbalize factors that may contribute to high blood pressure and make at least one lifestyle modification to help lower her BP   CCM RN CM Interventions:  03/06/20 call completed with patient  . Evaluation of current treatment plan related to Hypertension and patient's adherence to plan as established by provider . 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 Reviewed and discussed last PCP OV, BP was elevated to 136/90 with HR 99   BP Readings from Last 3 Encounters:  02/29/20 (!) 136/90  01/09/20 (!) 146/90  11/30/19 (!) 140/98    . Re-educated on importance of taking medication exactly as prescribed w/o missing doses for best effectiveness and decreased risk for rebound hypertensive crisis . Re-educated on techniques to reduce stress such as meditation, deep breathing and taking time for relaxation, asking for help when needed, drinking plenty of water and getting plenty of sleep   . Advised patient, providing education and rationale, to monitor blood pressure daily and record, calling the CCM team and or PCP provider  for findings outside established parameters . Discussed plans with patient for ongoing care management follow up and provided patient with direct contact information for care management team  Patient Self Care Activities:  . Self administers medications as prescribed . Attends all scheduled provider appointments . Calls pharmacy for medication  refills . Performs ADL's independently . Performs IADL's independently . Calls provider office for new concerns or questions  Please see past updates related to this goal by clicking on the "Past Updates" button in the selected goal        Patient verbalizes understanding of instructions provided today.   Telephone follow up appointment with care management team member scheduled for: 04/12/20  Barb Merino, RN, BSN, CCM Care Management Coordinator [facility]  Direct Phone: [phone]

## 2020-03-07 NOTE — Chronic Care Management (AMB) (Signed)
Care Management   Follow Up Note   03/06/2020 Name: [name] MRN: [id number] DOB: 04-15-1981  Referred by: Minette Brine, FNP Reason for referral : Care Coordination (FU RN CC Call )   [name] is a 39 y.o. year old female who is a primary care patient of Minette Brine, [place]. The CCM team was consulted for assistance with chronic disease management and care coordination needs.    Review of patient status, including review of consultants reports, relevant laboratory and other test results, and collaboration with appropriate care team members and the patient's provider was performed as part of comprehensive patient evaluation and provision of chronic care management services.    SDOH (Social Determinants of Health) assessments performed: Yes - no acute challenges noted at this time  See Care Plan activities for detailed interventions related to [facility])   Placed outbound call to patient for a CC RN CM FU call.     Outpatient Encounter Medications as of 03/06/2020  Medication Sig  . albuterol (PROVENTIL HFA;VENTOLIN HFA) 108 (90 BASE) MCG/ACT inhaler Inhale 2-3 puffs into the lungs every 6 (six) hours as needed for wheezing. Asthma/SOB  . albuterol (PROVENTIL) (5 MG/ML) 0.5% nebulizer solution Take 0.5 mLs (2.5 mg total) by nebulization every 6 (six) hours as needed for wheezing or shortness of breath.  . ALPRAZolam (XANAX) 0.5 MG tablet Take 1 tablet (0.5 mg total) by mouth 2 (two) times daily as needed for anxiety or sleep. (Patient not taking: Reported on 02/29/2020)  . atorvastatin (LIPITOR) 10 MG tablet Take 1 tablet (10 mg total) by mouth at bedtime.  . Blood Glucose Monitoring Suppl (ACCU-CHEK AVIVA) device USE TO CHECK BLOOD SUGARS UP TO 4 TIMES DAILY  DX CODE:E11.9  . buPROPion (WELLBUTRIN XL) 300 MG 24 hr tablet Take 1 tablet (300 mg total) by mouth at bedtime.  . cetirizine (ZYRTEC ALLERGY) 10 MG tablet Take 1 tablet (10 mg total) by mouth daily.  . dapagliflozin  propanediol (FARXIGA) 5 MG TABS tablet Take 1 tablet (5 mg total) by mouth daily before breakfast.  . diclofenac (VOLTAREN) 50 MG EC tablet Take 1 tablet (50 mg total) by mouth 3 (three) times daily. (Patient not taking: Reported on 02/29/2020)  . diclofenac Sodium (VOLTAREN) 1 % GEL Apply 2 g topically 4 (four) times daily. Rub into affected area of foot 2 to 4 times daily (Patient not taking: Reported on 02/29/2020)  . EPINEPHrine (EPIPEN 2-PAK) 0.3 mg/0.3 mL IJ SOAJ injection Inject 0.3 mLs (0.3 mg total) into the muscle once as needed (for severe allergic reaction). CAll 911 immediately if you have to use this medicine (Patient not taking: Reported on 02/29/2020)  . ferrous sulfate 325 (65 FE) MG EC tablet Take 1 tablet (325 mg total) by mouth 3 (three) times daily with meals.  [name] glucose blood test strip USE TO CHECK BLOOD SUGARS UP TO 4 TIMES DAILY  DX CODE:E11.9  . insulin degludec (TRESIBA FLEXTOUCH) 100 UNIT/ML FlexTouch Pen Inject 0.35 mLs (35 Units total) into the skin daily.  . Lancets (ACCU-CHEK MULTICLIX) lancets USE TO CHECK BLOOD SUGARS UP TO 4 TIMES DAILY  DX CODE:E11.9  . levonorgestrel (MIRENA) 20 MCG/24HR IUD 1 each by Intrauterine route once.  [name] losartan-hydrochlorothiazide (HYZAAR) 50-12.5 MG tablet Take 1 tablet by mouth daily.  . metFORMIN (GLUCOPHAGE XR) 750 MG 24 hr tablet Take 1 tablet (750 mg total) by mouth in the morning and at bedtime.  . propranolol (INDERAL) 10 MG tablet Take 1 tablet (  10 mg total) by mouth every evening.  . Semaglutide, 1 MG/DOSE, (OZEMPIC, 1 MG/DOSE,) 4 MG/3ML SOPN Inject 0.75 mLs (1 mg total) into the skin once a week.   No facility-administered encounter medications on file as of 03/06/2020.     Objective:  Lab Results  Component Value Date   HGBA1C 11.6 (H) 02/29/2020   HGBA1C 11.2 (H) 11/30/2019   HGBA1C 7.1 (H) 08/24/2019   Lab Results  Component Value Date   MICROALBUR 30 03/29/2019   LDLCALC 80 02/29/2020   CREATININE 0.80 02/29/2020     BP Readings from Last 3 Encounters:  02/29/20 (!) 136/90  01/09/20 (!) 146/90  11/30/19 (!) 140/98    Goals Addressed      Patient Stated   .  "I would like to better manage my diabetes" (pt-stated)        . CARE PLAN ENTRY . (see longitudinal plan of care for additional care plan information)  Current Barriers:  [name] Knowledge Deficits related to diabetes disease process and Self health management  . Chronic Disease Management support and education needs related to Class 3 severe obesity; depression; uncontrolled type II DM; Essential hypertension; Mixed Hyperlipidemia  . Non-adherence to Self monitoring CBG's   Nurse Case Manager Clinical Goal(s):  . New 01/20/20 Over the next 30 days, patient will check her CBG's at home on Saturday and Sunday and record her readings to review and discuss with her health care team . New 01/20/20 Over the next 90 days, patient will work with the embedded CCM team and PCP to increase her knowledge and adherence to following her DM treatment plan in order to achieve and maintain glucose in satisfactory range FBS 80-130; <180 after meals   CC RN CM Interventions:  03/06/20 call completed with patient  . Evaluation of current treatment plan related to Diabetes and patient's adherence to plan as established by provider . Provided education to patient re: current A1C increased to 11.6 % obtained on 7/721; discussed target A1C of <7.0; reinforced basic disease process of diabetes; reinforced importance of strictly following an ADA diet, taking anti-glycemic medications exactly as prescribed and implementing daily exercise at least a minimal 150 minutes/week minimally as recommended by ADA  . Re-educated on target daily glycemic control, FBS 80-130, <180 after meals  . Determined patient has started Self checking her CBG's at home and recording her readings, discussed she is more motivated to get her DM under control  . Reviewed medications with patient and  discussed patients prescribed diabetic regimen;  o Metformin 1000 mg bid with meals (patient is only taking 500 mg bid due to experiencing abdominal pain with larger dose); Ozempic 2 mg/1.5 mL, inject 1 mg weekly; Tresiba 100u/ml, inject 0.25 qd . Determined patient ran out of her DM medications, PCP is aware and will provide samples; Determined patient will pick up samples today  . Educated patient on importance of adhering to her prescribed medication regimen as directed, discussed importance of refilling medications when supply is getting low 7-14 days prior to running out to help avoid missed doses of medication  . Advised patient, providing education and rationale, to check cbg before meals 1-2 times daily and record, calling the CCM team and or MD for findings outside established parameters; patient is agreeable to trying to be consistent with Self checking CBG on the weekends in order to assess for effectiveness of current regimen and to ensure DM is well controlled to help prevent complications . Discussed plans with  patient for ongoing care management follow up and provided patient with direct contact information for care management team  Patient Self Care Activities:  . Self administers medications as prescribed . Attends all scheduled provider appointments . Calls pharmacy for medication refills . Attends church or other social activities . Performs ADL's independently . Performs IADL's independently . Calls provider office for new concerns or questions  Please see past updates related to this goal by clicking on the "Past Updates" button in the selected goal      .  "to keep my BP better controlled" (pt-stated)        . CARE PLAN ENTRY . (see longitudinal plan of care for additional care plan information)  Current Barriers:  [name] Knowledge Deficits related to disease process and Self Health management of Hypertension  . Chronic Disease Management support and education needs related  to Essential Hypertension; Class 3 severe obesity; type 2 Diabetes Mellitus; Mixed hyperlipidemia    Nurse Case Manager Clinical Goal(s):  . 01/20/20 New Over the next 90 days, patient will demonstrate improved adherence to prescribed treatment plan for Hypertension as evidenced bypatient will adhere to taking all medications w/o missed doses and will continue to Self monitor her BP at home, reporting abnormal readings to the CCM team and or PCP provider  . 01/20/20 New Over the next 90 days, patient will verbalize factors that may contribute to high blood pressure and make at least one lifestyle modification to help lower her BP   CCM RN CM Interventions:  03/06/20 call completed with patient  . Evaluation of current treatment plan related to Hypertension and patient's adherence to plan as established by provider . 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 Reviewed and discussed last PCP OV, BP was elevated to 136/90 with HR 99   BP Readings from Last 3 Encounters:  02/29/20 (!) 136/90  01/09/20 (!) 146/90  11/30/19 (!) 140/98    . Re-educated on importance of taking medication exactly as prescribed w/o missing doses for best effectiveness and decreased risk for rebound hypertensive crisis . Re-educated on techniques to reduce stress such as meditation, deep breathing and taking time for relaxation, asking for help when needed, drinking plenty of water and getting plenty of sleep   . Advised patient, providing education and rationale, to monitor blood pressure daily and record, calling the CCM team and or PCP provider  for findings outside established parameters . Discussed plans with patient for ongoing care management follow up and provided patient with direct contact information for care management team  Patient Self Care Activities:  . Self administers medications as  prescribed . Attends all scheduled provider appointments . Calls pharmacy for medication refills . Performs ADL's independently . Performs IADL's independently . Calls provider office for new concerns or questions  Please see past updates related to this goal by clicking on the "Past Updates" button in the selected goal        Plan:   Telephone follow up appointment with care management team member scheduled for: 04/12/20  Barb Merino, RN, BSN, CCM Care Management Coordinator [facility]  Direct Phone: [phone]

## 2020-03-13 DIAGNOSIS — F332 Major depressive disorder, recurrent severe without psychotic features: Secondary | ICD-10-CM | POA: Diagnosis not present

## 2020-03-13 DIAGNOSIS — F411 Generalized anxiety disorder: Secondary | ICD-10-CM | POA: Diagnosis not present

## 2020-03-15 ENCOUNTER — Telehealth: Payer: Self-pay

## 2020-03-15 NOTE — Telephone Encounter (Signed)
I called pt to notify her that she can come pick up her forms I also verified if she got the Hep Vaccine she stated she did and turned the form in to the school already. YL,RMA

## 2020-03-20 ENCOUNTER — Telehealth: Payer: Self-pay

## 2020-03-20 NOTE — Telephone Encounter (Signed)
Pt and pharmacy notified of Ozempic approval

## 2020-03-20 NOTE — Telephone Encounter (Signed)
Pt asked if we can call her school to let them know the date of her T-dap  [phone] Myles Lipps   Called LVM w/info

## 2020-03-29 ENCOUNTER — Other Ambulatory Visit: Payer: Self-pay | Admitting: Nurse Practitioner

## 2020-04-02 DIAGNOSIS — F332 Major depressive disorder, recurrent severe without psychotic features: Secondary | ICD-10-CM | POA: Diagnosis not present

## 2020-04-02 DIAGNOSIS — F411 Generalized anxiety disorder: Secondary | ICD-10-CM | POA: Diagnosis not present

## 2020-04-11 ENCOUNTER — Ambulatory Visit: Payer: Medicaid Other | Admitting: Nurse Practitioner

## 2020-04-12 ENCOUNTER — Telehealth: Payer: Medicaid Other

## 2020-04-12 DIAGNOSIS — F332 Major depressive disorder, recurrent severe without psychotic features: Secondary | ICD-10-CM | POA: Diagnosis not present

## 2020-04-12 DIAGNOSIS — F411 Generalized anxiety disorder: Secondary | ICD-10-CM | POA: Diagnosis not present

## 2020-04-16 ENCOUNTER — Ambulatory Visit: Payer: Medicaid Other | Admitting: Podiatry

## 2020-04-16 ENCOUNTER — Encounter: Payer: Self-pay | Admitting: Podiatry

## 2020-04-16 ENCOUNTER — Other Ambulatory Visit: Payer: Self-pay

## 2020-04-16 DIAGNOSIS — E119 Type 2 diabetes mellitus without complications: Secondary | ICD-10-CM

## 2020-04-16 DIAGNOSIS — Z794 Long term (current) use of insulin: Secondary | ICD-10-CM

## 2020-04-16 DIAGNOSIS — M2042 Other hammer toe(s) (acquired), left foot: Secondary | ICD-10-CM | POA: Diagnosis not present

## 2020-04-16 DIAGNOSIS — D492 Neoplasm of unspecified behavior of bone, soft tissue, and skin: Secondary | ICD-10-CM

## 2020-04-19 NOTE — Progress Notes (Signed)
Subjective:   Patient ID: [name], female   DOB: 39 y.o.   MRN: [id number]   HPI Patient presents in poor overall diabetic health with high A1c who has lesions bilateral and significant obesity which is complicating factor.  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  I do think at 1 point future she could consider surgical intervention but I would hold off currently and continue conservative along with wider shoes soaks topical medicines.  Debridement accomplished today and discussed again what could be done as part of bunion correction but it would be extensive and certainly something we would not do with her high A1c at this time

## 2020-04-23 DIAGNOSIS — F411 Generalized anxiety disorder: Secondary | ICD-10-CM | POA: Diagnosis not present

## 2020-04-23 DIAGNOSIS — F332 Major depressive disorder, recurrent severe without psychotic features: Secondary | ICD-10-CM | POA: Diagnosis not present

## 2020-05-01 DIAGNOSIS — F332 Major depressive disorder, recurrent severe without psychotic features: Secondary | ICD-10-CM | POA: Diagnosis not present

## 2020-05-01 DIAGNOSIS — F411 Generalized anxiety disorder: Secondary | ICD-10-CM | POA: Diagnosis not present

## 2020-05-03 ENCOUNTER — Ambulatory Visit: Payer: Medicaid Other | Admitting: Nurse Practitioner

## 2020-05-08 ENCOUNTER — Encounter: Payer: Self-pay | Admitting: Nurse Practitioner

## 2020-05-08 ENCOUNTER — Other Ambulatory Visit: Payer: Self-pay

## 2020-05-08 ENCOUNTER — Ambulatory Visit (INDEPENDENT_AMBULATORY_CARE_PROVIDER_SITE_OTHER): Payer: Medicaid Other | Admitting: Nurse Practitioner

## 2020-05-08 VITALS — BP 138/80 | HR 94 | Temp 98.4°F | Ht 63.6 in | Wt 353.1 lb

## 2020-05-08 DIAGNOSIS — F32A Depression, unspecified: Secondary | ICD-10-CM

## 2020-05-08 DIAGNOSIS — Z6841 Body Mass Index (BMI) 40.0 and over, adult: Secondary | ICD-10-CM

## 2020-05-08 DIAGNOSIS — F419 Anxiety disorder, unspecified: Secondary | ICD-10-CM

## 2020-05-08 DIAGNOSIS — R519 Headache, unspecified: Secondary | ICD-10-CM

## 2020-05-08 DIAGNOSIS — N898 Other specified noninflammatory disorders of vagina: Secondary | ICD-10-CM | POA: Diagnosis not present

## 2020-05-08 DIAGNOSIS — I10 Essential (primary) hypertension: Secondary | ICD-10-CM | POA: Diagnosis not present

## 2020-05-08 DIAGNOSIS — E66813 Obesity, class 3: Secondary | ICD-10-CM

## 2020-05-08 DIAGNOSIS — E1165 Type 2 diabetes mellitus with hyperglycemia: Secondary | ICD-10-CM | POA: Diagnosis not present

## 2020-05-08 LAB — POCT UA - MICROALBUMIN
Albumin/Creatinine Ratio, Urine, POC: 30
Creatinine, POC: 100 mg/dL
Microalbumin Ur, POC: 30 mg/L

## 2020-05-08 MED ORDER — NALTREXONE-BUPROPION HCL ER 8-90 MG PO TB12: ORAL_TABLET | ORAL | 1 refills | Status: DC

## 2020-05-08 MED ORDER — BUPROPION HCL ER (XL) 300 MG PO TB24: 300.0000 mg | ORAL_TABLET | Freq: Every day | ORAL | 1 refills | Status: DC

## 2020-05-08 MED ORDER — PROPRANOLOL HCL 10 MG PO TABS: 10.0000 mg | ORAL_TABLET | Freq: Every evening | ORAL | 1 refills | Status: DC

## 2020-05-08 MED ORDER — DAPAGLIFLOZIN PROPANEDIOL 5 MG PO TABS: 5.0000 mg | ORAL_TABLET | Freq: Every day | ORAL | 1 refills | Status: DC

## 2020-05-08 NOTE — Progress Notes (Signed)
This visit occurred during the SARS-CoV-2 public health emergency.  Safety protocols were in place, including screening questions prior to the visit, additional usage of staff PPE, and extensive cleaning of exam room while observing appropriate contact time as indicated for disinfecting solutions.  Subjective:     Patient ID: [name] , female    DOB: 1981/03/15 , 39 y.o.   MRN: [id number]   Chief Complaint  Patient presents with   Diabetes    HPI  Patient is here for a DM f/u.  Wt Readings from Last 3 Encounters: 05/08/20 : (!) 353 lb 1.6 oz (160.2 kg) 02/29/20 : (!) 356 lb 9.6 oz (161.8 kg) 01/09/20 : (!) 359 lb 3.2 oz (162.9 kg)  She reports she has not been able to check her blood sugars for the last 2 weeks has been in the 200's.  Her glucometer is not charging.   She continues to see her therapist Ms. [place]  Diabetes She presents for her follow-up diabetic visit. She has type 2 diabetes mellitus. Her disease course has been worsening. Hypoglycemia symptoms include nervousness/anxiousness. Pertinent negatives for hypoglycemia include no dizziness or headaches. Pertinent negatives for diabetes include no chest pain and no fatigue. There are no hypoglycemic complications. There are no diabetic complications. Risk factors for coronary artery disease include sedentary lifestyle, obesity, stress, hypertension and diabetes mellitus. Current diabetic treatment includes oral agent (monotherapy). She is compliant with treatment some of the time. When asked about meal planning, she reported none. She has not had a previous visit with a dietitian. (Blood sugars are up to 200's)  Hypertension This is a chronic problem. The current episode started more than 1 year ago. The problem has been gradually worsening since onset. The problem is uncontrolled. Pertinent negatives include no chest pain, headaches or palpitations. Risk factors for coronary artery disease include obesity and  sedentary lifestyle.     Past Medical History:  Diagnosis Date   Abnormal Pap smear    f/u wnl   Asthma    Fibroid    Gestational diabetes    Hypertension    Morbid obesity ([place])    Sleep apnea    Urinary tract infection      Family History  Problem Relation Age of Onset   Diabetes Father    Allergies Mother    Asthma Mother    Hypertension Mother    Diabetes Mother    Diabetes Maternal Grandmother    Heart disease Maternal Grandmother    Anesthesia problems Neg Hx    Hypotension Neg Hx    Malignant hyperthermia Neg Hx    Pseudochol deficiency Neg Hx    Hearing loss Neg Hx      Current Outpatient Medications:    albuterol (PROVENTIL HFA;VENTOLIN HFA) 108 (90 BASE) MCG/ACT inhaler, Inhale 2-3 puffs into the lungs every 6 (six) hours as needed for wheezing. Asthma/SOB, Disp: , Rfl:    albuterol (PROVENTIL) (5 MG/ML) 0.5% nebulizer solution, Take 0.5 mLs (2.5 mg total) by nebulization every 6 (six) hours as needed for wheezing or shortness of breath., Disp: 20 mL, Rfl: 12   atorvastatin (LIPITOR) 10 MG tablet, Take 1 tablet (10 mg total) by mouth at bedtime., Disp: 90 tablet, Rfl: 1   buPROPion (WELLBUTRIN XL) 300 MG 24 hr tablet, Take 1 tablet (300 mg total) by mouth at bedtime. Take 1 tab by mouth every other day x 2 weeks then take 1 tab by mouth every second day x 2 weeks then discontinue,  Disp: 90 tablet, Rfl: 1   cetirizine (ZYRTEC ALLERGY) 10 MG tablet, Take 1 tablet (10 mg total) by mouth daily., Disp: 90 tablet, Rfl: 0   dapagliflozin propanediol (FARXIGA) 5 MG TABS tablet, Take 1 tablet (5 mg total) by mouth daily before breakfast., Disp: 90 tablet, Rfl: 1   diclofenac (VOLTAREN) 50 MG EC tablet, Take 1 tablet (50 mg total) by mouth 3 (three) times daily., Disp: 45 tablet, Rfl: 3   ferrous sulfate 325 (65 FE) MG EC tablet, Take 1 tablet (325 mg total) by mouth 3 (three) times daily with meals., Disp: 90 tablet, Rfl: 2   glucose blood test  strip, USE TO CHECK BLOOD SUGARS UP TO 4 TIMES DAILY  DX CODE:E11.9, Disp: 100 each, Rfl: 12   insulin degludec (TRESIBA FLEXTOUCH) 100 UNIT/ML FlexTouch Pen, Inject 0.35 mLs (35 Units total) into the skin daily., Disp: 5 pen, Rfl: 5   Lancets (ACCU-CHEK MULTICLIX) lancets, USE TO CHECK BLOOD SUGARS UP TO 4 TIMES DAILY  DX CODE:E11.9, Disp: 100 each, Rfl: 12   levonorgestrel (MIRENA) 20 MCG/24HR IUD, 1 each by Intrauterine route once., Disp: , Rfl:    losartan-hydrochlorothiazide (HYZAAR) 50-12.5 MG tablet, Take 1 tablet by mouth daily., Disp: 90 tablet, Rfl: 1   metFORMIN (GLUCOPHAGE XR) 750 MG 24 hr tablet, Take 1 tablet (750 mg total) by mouth in the morning and at bedtime., Disp: 180 tablet, Rfl: 1   propranolol (INDERAL) 10 MG tablet, Take 1 tablet (10 mg total) by mouth every evening., Disp: 90 tablet, Rfl: 1   Semaglutide, 1 MG/DOSE, (OZEMPIC, 1 MG/DOSE,) 4 MG/3ML SOPN, Inject 0.75 mLs (1 mg total) into the skin once a week., Disp: 9 mL, Rfl: 1   ALPRAZolam (XANAX) 0.5 MG tablet, Take 1 tablet (0.5 mg total) by mouth 2 (two) times daily as needed for anxiety or sleep. (Patient not taking: Reported on 02/29/2020), Disp: 20 tablet, Rfl: 0   diclofenac Sodium (VOLTAREN) 1 % GEL, Apply 2 g topically 4 (four) times daily. Rub into affected area of foot 2 to 4 times daily (Patient not taking: Reported on 02/29/2020), Disp: 100 g, Rfl: 2   EPINEPHrine (EPIPEN 2-PAK) 0.3 mg/0.3 mL IJ SOAJ injection, Inject 0.3 mLs (0.3 mg total) into the muscle once as needed (for severe allergic reaction). CAll 911 immediately if you have to use this medicine (Patient not taking: Reported on 02/29/2020), Disp: 1 each, Rfl: 1   fluconazole (DIFLUCAN) 150 MG tablet, Take 1 tablet at the onset of symptoms then repeat in 5 days., Disp: 2 tablet, Rfl: 0   Naltrexone-buPROPion HCl ER 8-90 MG TB12, Take 1 tablet by mouth daily for 1 week then increase to 1 tablet by mouth twice a day x 1 week then increase to 1 tab by  mouth am and 2 tabs by mouth pm x 1 week then 2 tabs by mouth twice a day, Disp: 120 tablet, Rfl: 1   Allergies  Allergen Reactions   Other Anaphylaxis and Other (See Comments)    Pt has fish allergy Pt has allergy to butter cookies   Penicillins Rash and Other (See Comments)    Told MD that PCN reaction is rash and that she has taken amoxicillin before   Bactrim [Sulfamethoxazole-Trimethoprim]     Diarrhea, abdominal cramps and rash     Review of Systems  Constitutional: Negative for fatigue.  Respiratory: Negative.   Cardiovascular: Negative for chest pain, palpitations and leg swelling.  Neurological: Negative for dizziness and headaches.  Psychiatric/Behavioral: The patient is nervous/anxious.      Today's Vitals   05/08/20 1624  BP: 138/80  Pulse: 94  Temp: 98.4 F (36.9 C)  TempSrc: Oral  Weight: (!) 353 lb 1.6 oz (160.2 kg)  Height: 5' 3.6" (1.615 m)  PainSc: 0-No pain   Body mass index is 61.37 kg/m.   Objective:  Physical Exam Constitutional:      General: She is not in acute distress.    Appearance: Normal appearance. She is obese.  Cardiovascular:     Rate and Rhythm: Normal rate and regular rhythm.     Pulses: Normal pulses.     Heart sounds: Normal heart sounds. No murmur heard.   Pulmonary:     Effort: Pulmonary effort is normal. No respiratory distress.     Breath sounds: Normal breath sounds. No wheezing.  Skin:    Capillary Refill: Capillary refill takes less than 2 seconds.  Neurological:     General: No focal deficit present.     Mental Status: She is alert and oriented to person, place, and time.     Cranial Nerves: No cranial nerve deficit.  Psychiatric:        Mood and Affect: Mood normal.        Behavior: Behavior normal.        Thought Content: Thought content normal.        Judgment: Judgment normal.         Assessment And Plan:     1. Uncontrolled type 2 diabetes mellitus with hyperglycemia (HCC)  Chronic, poorly  controlled  She is taking farxiga, she has had vaginal itching since starting I will treat with diflucan - dapagliflozin propanediol (FARXIGA) 5 MG TABS tablet; Take 1 tablet (5 mg total) by mouth daily before breakfast.  Dispense: 90 tablet; Refill: 1 - propranolol (INDERAL) 10 MG tablet; Take 1 tablet (10 mg total) by mouth every evening.  Dispense: 90 tablet; Refill: 1 - Lipid panel - CMP14+EGFR - Hemoglobin A1c - POCT UA - Microalbumin  2. Essential hypertension  Chronic, fair control  Continue with propranolol   She is advised to avoid high salt foods - propranolol (INDERAL) 10 MG tablet; Take 1 tablet (10 mg total) by mouth every evening.  Dispense: 90 tablet; Refill: 1  3. Anxiety  I will be taking her off the wellbutrin because she is starting contrave  She will be weaning off slowly  - propranolol (INDERAL) 10 MG tablet; Take 1 tablet (10 mg total) by mouth every evening.  Dispense: 90 tablet; Refill: 1  4. Persistent headaches  She is under increased stress which can be causing her headaches as well.  - propranolol (INDERAL) 10 MG tablet; Take 1 tablet (10 mg total) by mouth every evening.  Dispense: 90 tablet; Refill: 1  5. Class 3 severe obesity due to excess calories with serious comorbidity and body mass index (BMI) of 60.0 to 69.9 in adult [facility])  She is encouraged to increase her physical activity and to avoid eating high fat and high carbohydrate foods.   She will be starting on Contrave as well. - buPROPion (WELLBUTRIN XL) 300 MG 24 hr tablet; Take 1 tablet (300 mg total) by mouth at bedtime. Take 1 tab by mouth every other day x 2 weeks then take 1 tab by mouth every second day x 2 weeks then discontinue  Dispense: 90 tablet; Refill: 1 - Naltrexone-buPROPion HCl ER 8-90 MG TB12; Take 1 tablet by mouth daily for 1 week  then increase to 1 tablet by mouth twice a day x 1 week then increase to 1 tab by mouth am and 2 tabs by mouth pm x 1 week then 2 tabs by mouth  twice a day  Dispense: 120 tablet; Refill: 1  6. Depression, unspecified depression type - buPROPion (WELLBUTRIN XL) 300 MG 24 hr tablet; Take 1 tablet (300 mg total) by mouth at bedtime. Take 1 tab by mouth every other day x 2 weeks then take 1 tab by mouth every second day x 2 weeks then discontinue  Dispense: 90 tablet; Refill: 1  7. Vaginal itching  Likely related to her farxiga however will check std screening - Chlamydia/Gonococcus/Trichomonas, NAA     Patient was given opportunity to ask questions. Patient verbalized understanding of the plan and was able to repeat key elements of the plan. All questions were answered to their satisfaction.   Teola Bradley, FNP, have reviewed all documentation for this visit. The documentation on 05/27/20 for the exam, diagnosis, procedures, and orders are all accurate and complete.   THE PATIENT IS ENCOURAGED TO PRACTICE SOCIAL DISTANCING DUE TO THE COVID-19 PANDEMIC.

## 2020-05-08 NOTE — Patient Instructions (Signed)
Obesity, Adult Obesity is having too much body fat. Being obese means that your weight is more than what is healthy for you. BMI is a number that explains how much body fat you have. If you have a BMI of 30 or more, you are obese. Obesity is often caused by eating or drinking more calories than your body uses. Changing your lifestyle can help you lose weight. Obesity can cause serious health problems, such as:  Stroke.  Coronary artery disease (CAD).  Type 2 diabetes.  Some types of cancer, including cancers of the colon, breast, uterus, and gallbladder.  Osteoarthritis.  High blood pressure (hypertension).  High cholesterol.  Sleep apnea.  Gallbladder stones.  Infertility problems. What are the causes?  Eating meals each day that are high in calories, sugar, and fat.  Being born with genes that may make you more likely to become obese.  Having a medical condition that causes obesity.  Taking certain medicines.  Sitting a lot (having a sedentary lifestyle).  Not getting enough sleep.  Drinking a lot of drinks that have sugar in them. What increases the risk?  Having a family history of obesity.  Being an African American woman.  Being a Hispanic man.  Living in an area with limited access to: ? Parks, recreation centers, or sidewalks. ? Healthy food choices, such as grocery stores and farmers' markets. What are the signs or symptoms? The main sign is having too much body fat. How is this treated?  Treatment for this condition often includes changing your lifestyle. Treatment may include: ? Changing your diet. This may include making a healthy meal plan. ? Exercise. This may include activity that causes your heart to beat faster (aerobic exercise) and strength training. Work with your doctor to design a program that works for you. ? Medicine to help you lose weight. This may be used if you are not able to lose 1 pound a week after 6 weeks of healthy eating and  more exercise. ? Treating conditions that cause the obesity. ? Surgery. Options may include gastric banding and gastric bypass. This may be done if:  Other treatments have not helped to improve your condition.  You have a BMI of 40 or higher.  You have life-threatening health problems related to obesity. Follow these instructions at home: Eating and drinking   Follow advice from your doctor about what to eat and drink. Your doctor may tell you to: ? Limit fast food, sweets, and processed snack foods. ? Choose low-fat options. For example, choose low-fat milk instead of whole milk. ? Eat 5 or more servings of fruits or vegetables each day. ? Eat at home more often. This gives you more control over what you eat. ? Choose healthy foods when you eat out. ? Learn to read food labels. This will help you learn how much food is in 1 serving. ? Keep low-fat snacks available. ? Avoid drinks that have a lot of sugar in them. These include soda, fruit juice, iced tea with sugar, and flavored milk.  Drink enough water to keep your pee (urine) pale yellow.  Do not go on fad diets. Physical activity  Exercise often, as told by your doctor. Most adults should get up to 150 minutes of moderate-intensity exercise every week.Ask your doctor: ? What types of exercise are safe for you. ? How often you should exercise.  Warm up and stretch before being active.  Do slow stretching after being active (cool down).  Rest between   times of being active. Lifestyle  Work with your doctor and a food expert (dietitian) to set a weight-loss goal that is best for you.  Limit your screen time.  Find ways to reward yourself that do not involve food.  Do not drink alcohol if: ? Your doctor tells you not to drink. ? You are pregnant, may be pregnant, or are planning to become pregnant.  If you drink alcohol: ? Limit how much you use to:  0-1 drink a day for women.  0-2 drinks a day for men. ? Be  aware of how much alcohol is in your drink. In the [place] one drink equals one 12 oz bottle of beer (355 mL), one 5 oz glass of wine (148 mL), or one 1 oz glass of hard liquor (44 mL). General instructions  Keep a weight-loss journal. This can help you keep track of: ? The food that you eat. ? How much exercise you get.  Take over-the-counter and prescription medicines only as told by your doctor.  Take vitamins and supplements only as told by your doctor.  Think about joining a support group.  Keep all follow-up visits as told by your doctor. This is important. Contact a doctor if:  You cannot meet your weight loss goal after you have changed your diet and lifestyle for 6 weeks. Get help right away if you:  Are having trouble breathing.  Are having thoughts of harming yourself. Summary  Obesity is having too much body fat.  Being obese means that your weight is more than what is healthy for you.  Work with your doctor to set a weight-loss goal.  Get regular exercise as told by your doctor. This information is not intended to replace advice given to you by your health care provider. Make sure you discuss any questions you have with your health care provider. Document Revised: 03/25/2018 Document Reviewed: 03/25/2018 Elsevier Patient Education  2020 [employer]. Exercising to Lose Weight Exercise is structured, repetitive physical activity to improve fitness and health. Getting regular exercise is important for everyone. It is especially important if you are overweight. Being overweight increases your risk of heart disease, stroke, diabetes, high blood pressure, and several types of cancer. Reducing your calorie intake and exercising can help you lose weight. Exercise is usually categorized as moderate or vigorous intensity. To lose weight, most people need to do a certain amount of moderate-intensity or vigorous-intensity exercise each week. Moderate-intensity  exercise  Moderate-intensity exercise is any activity that gets you moving enough to burn at least three times more energy (calories) than if you were sitting. Examples of moderate exercise include:  Walking a mile in 15 minutes.  Doing light yard work.  Biking at an easy pace. Most people should get at least 150 minutes (2 hours and 30 minutes) a week of moderate-intensity exercise to maintain their body weight. Vigorous-intensity exercise Vigorous-intensity exercise is any activity that gets you moving enough to burn at least six times more calories than if you were sitting. When you exercise at this intensity, you should be working hard enough that you are not able to carry on a conversation. Examples of vigorous exercise include:  Running.  Playing a team sport, such as football, basketball, and soccer.  Jumping rope. Most people should get at least 75 minutes (1 hour and 15 minutes) a week of vigorous-intensity exercise to maintain their body weight. How can exercise affect me? When you exercise enough to burn more calories than you eat,  you lose weight. Exercise also reduces body fat and builds muscle. The more muscle you have, the more calories you burn. Exercise also:  Improves mood.  Reduces stress and tension.  Improves your overall fitness, flexibility, and endurance.  Increases bone strength. The amount of exercise you need to lose weight depends on:  Your age.  The type of exercise.  Any health conditions you have.  Your overall physical ability. Talk to your health care provider about how much exercise you need and what types of activities are safe for you. What actions can I take to lose weight? Nutrition   Make changes to your diet as told by your health care provider or diet and nutrition specialist (dietitian). This may include: ? Eating fewer calories. ? Eating more protein. ? Eating less unhealthy fats. ? Eating a diet that includes fresh fruits  and vegetables, whole grains, low-fat dairy products, and lean protein. ? Avoiding foods with added fat, salt, and sugar.  Drink plenty of water while you exercise to prevent dehydration or heat stroke. Activity  Choose an activity that you enjoy and set realistic goals. Your health care provider can help you make an exercise plan that works for you.  Exercise at a moderate or vigorous intensity most days of the week. ? The intensity of exercise may vary from person to person. You can tell how intense a workout is for you by paying attention to your breathing and heartbeat. Most people will notice their breathing and heartbeat get faster with more intense exercise.  Do resistance training twice each week, such as: ? Push-ups. ? Sit-ups. ? Lifting weights. ? Using resistance bands.  Getting short amounts of exercise can be just as helpful as long structured periods of exercise. If you have trouble finding time to exercise, try to include exercise in your daily routine. ? Get up, stretch, and walk around every 30 minutes throughout the day. ? Go for a walk during your lunch break. ? Park your car farther away from your destination. ? If you take public transportation, get off one stop early and walk the rest of the way. ? Make phone calls while standing up and walking around. ? Take the stairs instead of elevators or escalators.  Wear comfortable clothes and shoes with good support.  Do not exercise so much that you hurt yourself, feel dizzy, or get very short of breath. Where to find more information  [place]S. Department of Health and Human Services: [employer]at  Centers for Disease Control and Prevention (CDC): http://www.wolf.info/ Contact a health care provider:  Before starting a new exercise program.  If you have questions or concerns about your weight.  If you have a medical problem that keeps you from exercising. Get help right away if you have any of the following while  exercising:  Injury.  Dizziness.  Difficulty breathing or shortness of breath that does not go away when you stop exercising.  Chest pain.  Rapid heartbeat. Summary  Being overweight increases your risk of heart disease, stroke, diabetes, high blood pressure, and several types of cancer.  Losing weight happens when you burn more calories than you eat.  Reducing the amount of calories you eat in addition to getting regular moderate or vigorous exercise each week helps you lose weight. This information is not intended to replace advice given to you by your health care provider. Make sure you discuss any questions you have with your health care provider. Document Revised: 08/03/2017 Document Reviewed: 08/03/2017 Elsevier  Patient Education  [employer].

## 2020-05-09 ENCOUNTER — Other Ambulatory Visit: Payer: Self-pay

## 2020-05-09 LAB — CMP14+EGFR
ALT: 39 IU/L — ABNORMAL HIGH (ref 0–32)
AST: 19 IU/L (ref 0–40)
Albumin/Globulin Ratio: 1.5 (ref 1.2–2.2)
Albumin: 4 g/dL (ref 3.8–4.8)
Alkaline Phosphatase: 61 IU/L (ref 44–121)
BUN/Creatinine Ratio: 11 (ref 9–23)
BUN: 9 mg/dL (ref 6–20)
Bilirubin Total: 0.4 mg/dL (ref 0.0–1.2)
CO2: 24 mmol/L (ref 20–29)
Calcium: 9.6 mg/dL (ref 8.7–10.2)
Chloride: 101 mmol/L (ref 96–106)
Creatinine, Ser: 0.79 mg/dL (ref 0.57–1.00)
GFR calc Af Amer: 110 mL/min/{1.73_m2} (ref >59)
GFR calc non Af Amer: 95 mL/min/{1.73_m2} (ref >59)
Globulin, Total: 2.6 g/dL (ref 1.5–4.5)
Glucose: 180 mg/dL — ABNORMAL HIGH (ref 65–99)
Potassium: 4 mmol/L (ref 3.5–5.2)
Sodium: 137 mmol/L (ref 134–144)
Total Protein: 6.6 g/dL (ref 6.0–8.5)

## 2020-05-09 LAB — LIPID PANEL
Chol/HDL Ratio: 3.4 ratio (ref 0.0–4.4)
Cholesterol, Total: 148 mg/dL (ref 100–199)
HDL: 44 mg/dL (ref >39)
LDL Chol Calc (NIH): 85 mg/dL (ref 0–99)
Triglycerides: 105 mg/dL (ref 0–149)
VLDL Cholesterol Cal: 19 mg/dL (ref 5–40)

## 2020-05-09 LAB — HEMOGLOBIN A1C
Est. average glucose Bld gHb Est-mCnc: 243 mg/dL
Hgb A1c MFr Bld: 10.1 % — ABNORMAL HIGH (ref 4.8–5.6)

## 2020-05-09 MED ORDER — FLUCONAZOLE 150 MG PO TABS: ORAL_TABLET | ORAL | 0 refills | Status: DC

## 2020-05-14 ENCOUNTER — Telehealth: Payer: Medicaid Other

## 2020-05-14 ENCOUNTER — Telehealth: Payer: Self-pay

## 2020-05-14 NOTE — Telephone Encounter (Cosign Needed)
  Chronic Care Management   Outreach Note  05/14/2020 Name: [name] MRN: [id number] DOB: January 28, 1981  Referred by: Minette Brine, FNP Reason for referral : Chronic Care Management (CC RNCM FU Call )   An unsuccessful telephone outreach was attempted today. The patient was referred to the case management team for assistance with care management and care coordination.   Follow Up Plan: A HIPAA compliant phone message was left for the patient providing contact information and requesting a return call.  Telephone follow up appointment with care management team member scheduled for: 06/15/20  Barb Merino, RN, BSN, CCM Care Management Coordinator [facility] [facility]  Direct Phone: [phone]

## 2020-05-15 ENCOUNTER — Telehealth: Payer: Self-pay

## 2020-05-15 NOTE — Telephone Encounter (Cosign Needed)
  Chronic Care Management   Outreach Note  05/15/2020 Name: [name] MRN: [id number] DOB: 19-Aug-1980  Referred by: Minette Brine, FNP Reason for referral : Care Coordination (Inbound call from patient )   Inbound call received from patient with a voice messaging requesting a return call. A second unsuccessful telephone outreach was attempted today. The patient was referred to the case management team for assistance with care management and care coordination.   Follow Up Plan: Telephone follow up appointment with care management team member scheduled for: 06/15/20  Barb Merino, RN, BSN, CCM Care Management Coordinator [place] [facility]  Direct Phone: [phone]

## 2020-05-21 ENCOUNTER — Ambulatory Visit (INDEPENDENT_AMBULATORY_CARE_PROVIDER_SITE_OTHER): Payer: Medicaid Other | Admitting: Podiatry

## 2020-05-21 ENCOUNTER — Other Ambulatory Visit: Payer: Self-pay

## 2020-05-21 DIAGNOSIS — L989 Disorder of the skin and subcutaneous tissue, unspecified: Secondary | ICD-10-CM | POA: Diagnosis not present

## 2020-05-21 NOTE — Progress Notes (Signed)
Subjective: 39 year old female presents the office today for follow-up evaluation of a painful callus to her big toe joint, medial aspect.  Her A1c is down to 10 from 11.  Denies any open sores or drainage or swelling.  The toenail site is healed the nails been growing back without any issues.  Denies any open sores. Denies any systemic complaints such as fevers, chills, nausea, vomiting. No acute changes since last appointment, and no other complaints at this time.   Objective: AAO x3, NAD DP/PT pulses palpable bilaterally, CRT less than 3 seconds Status post total nail avulsion left hallux and the nailbed is healed and the nail is growing back majority of the way.  There is no pain the nail itself.  There is a hyperkeratotic lesion medial hallux IPJ.  There is no ongoing ulceration there is dried blood and preulcerative lesion.  There is no edema, erythema or signs of infection.  No open lesions or pre-ulcerative lesions.  No pain with calf compression, swelling, warmth, erythema  Assessment: Preulcerative skin lesion  Plan: -All treatment options discussed with the patient including all alternatives, risks, complications.  -Debrided hyperkeratotic lesion with any complications or bleeding.  Continue offloading at all times.  Monitor for any skin breakdown or signs of infection.    Trula Slade DPM

## 2020-05-23 DIAGNOSIS — F332 Major depressive disorder, recurrent severe without psychotic features: Secondary | ICD-10-CM | POA: Diagnosis not present

## 2020-05-23 DIAGNOSIS — F411 Generalized anxiety disorder: Secondary | ICD-10-CM | POA: Diagnosis not present

## 2020-06-02 ENCOUNTER — Other Ambulatory Visit: Payer: Self-pay | Admitting: Nurse Practitioner

## 2020-06-02 DIAGNOSIS — E1165 Type 2 diabetes mellitus with hyperglycemia: Secondary | ICD-10-CM

## 2020-06-11 ENCOUNTER — Encounter: Payer: Medicaid Other | Admitting: Nurse Practitioner

## 2020-06-13 ENCOUNTER — Telehealth: Payer: Self-pay

## 2020-06-13 NOTE — Telephone Encounter (Signed)
Pt called to reschedule missed appt, LVM

## 2020-06-15 ENCOUNTER — Telehealth: Payer: Medicaid Other

## 2020-06-25 ENCOUNTER — Encounter: Payer: Self-pay | Admitting: Podiatry

## 2020-06-25 ENCOUNTER — Ambulatory Visit: Payer: Medicaid Other | Admitting: Podiatry

## 2020-06-25 ENCOUNTER — Other Ambulatory Visit: Payer: Self-pay

## 2020-06-25 DIAGNOSIS — E119 Type 2 diabetes mellitus without complications: Secondary | ICD-10-CM

## 2020-06-25 DIAGNOSIS — M2042 Other hammer toe(s) (acquired), left foot: Secondary | ICD-10-CM

## 2020-06-25 DIAGNOSIS — D492 Neoplasm of unspecified behavior of bone, soft tissue, and skin: Secondary | ICD-10-CM | POA: Diagnosis not present

## 2020-06-25 DIAGNOSIS — Z794 Long term (current) use of insulin: Secondary | ICD-10-CM

## 2020-07-01 NOTE — Progress Notes (Signed)
Subjective:   Patient ID: [name], female   DOB: 39 y.o.   MRN: [id number]   HPI Patient presents stating she has digital deformity left that she is concerned about thick lesions on the big toes bilateral that can become painful and underneath both feet that can become painful and is wondering if there is a more permanent solution   ROS      Objective:  Physical Exam  Neurological sensation reduced with obesity is complicating factor with vascular status adequate with plantar keratotic lesion that has pinpoint bleeding upon debridement with digital deformities of the hallux bilateral     Assessment:  Chronic lesion formation with at risk diabetic who 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  Debridement done packed with medication to try to soften the lesions with sterile dressings and instructed what to do if any blistering were to occur and to come back in if any redness occurs

## 2020-07-23 ENCOUNTER — Ambulatory Visit
Admission: EM | Admit: 2020-07-23 | Discharge: 2020-07-23 | Disposition: A | Discharge status: Home / Self Care | Payer: Medicaid Other | Attending: Emergency Medicine | Admitting: Emergency Medicine

## 2020-07-23 DIAGNOSIS — J4521 Mild intermittent asthma with (acute) exacerbation: Secondary | ICD-10-CM

## 2020-07-23 DIAGNOSIS — J019 Acute sinusitis, unspecified: Secondary | ICD-10-CM | POA: Diagnosis not present

## 2020-07-23 LAB — POCT FASTING CBG KUC MANUAL ENTRY: POCT Glucose (KUC): 213 mg/dL — AB (ref 70–99)

## 2020-07-23 MED ORDER — DOXYCYCLINE HYCLATE 100 MG PO CAPS: 100.0000 mg | ORAL_CAPSULE | Freq: Two times a day (BID) | ORAL | 0 refills | Status: AC

## 2020-07-23 MED ORDER — BENZONATATE 200 MG PO CAPS: 200.0000 mg | ORAL_CAPSULE | Freq: Three times a day (TID) | ORAL | 0 refills | Status: AC | PRN

## 2020-07-23 MED ORDER — BLOOD GLUCOSE MONITOR KIT: PACK | 0 refills | Status: DC

## 2020-07-23 MED ORDER — PREDNISONE 20 MG PO TABS: 40.0000 mg | ORAL_TABLET | Freq: Every day | ORAL | 0 refills | Status: AC

## 2020-07-23 MED ORDER — ALBUTEROL SULFATE HFA 108 (90 BASE) MCG/ACT IN AERS: 1.0000 | INHALATION_SPRAY | Freq: Four times a day (QID) | RESPIRATORY_TRACT | 0 refills | Status: AC | PRN

## 2020-07-23 MED ORDER — FLUTICASONE PROPIONATE 50 MCG/ACT NA SUSP: 1.0000 | Freq: Every day | NASAL | 0 refills | Status: DC

## 2020-07-23 NOTE — Discharge Instructions (Signed)
Covid test pending, monitor my chart for results Begin doxycycline to cover sinus infection as well as infection in lungs Tessalon for cough May begin prednisone but keep a close eye on blood sugars and take diabetes medicine as prescribed Albuterol inhaler as needed May supplement with Mucinex DM for further relief of cough and congestion

## 2020-07-23 NOTE — ED Triage Notes (Signed)
Pt c/o productive cough with brown sputum, congestion for approx 1 week. C/o SOB and hoarseness onset yesterday. 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  Denies h/o COVID infection; has had both covid vaccines. Has taken tylenol cough/cold-last taken yesterday.  Took albuterol inhaler today at approx 1330 and reports improvement with breathing since taking.  Bilateral lung sounds CTA.

## 2020-07-23 NOTE — ED Provider Notes (Addendum)
[facility]    CSN: [id number] Arrival date & time: 07/23/20  1443      History   Chief Complaint Chief Complaint  Patient presents with   Cough    HPI [name] is a 39 y.o. female history of OSA, asthma, DM type II, hypertension, presenting today for evaluation of cough.  Patient reports that she has had cough and congestion for approximately 1 week.  She has had associated shortness of breath and hoarseness beginning over the past 1 to 2 days.  She has had sore throat, but relates this to throat irritation from coughing.  Denies any fevers, loss of taste and smell, nausea vomiting or diarrhea.  She was exposed to Covid last week.  Is fully Covid vaccinated.  Using Tylenol and cough and cold medicine.  Using albuterol inhaler with some improvement.  Unsure of recent blood sugar readings.  Does feel asthma is flared up to where she may need some prednisone or steroids.  HPI  Past Medical History:  Diagnosis Date   Abnormal Pap smear    f/u wnl   Asthma    Fibroid    Gestational diabetes    Hypertension    Morbid obesity ([place])    Sleep apnea    Urinary tract infection     Patient Active Problem List   Diagnosis Date Noted   Class 3 severe obesity due to excess calories with serious comorbidity and body mass index (BMI) of 50.0 to 59.9 in adult ([place]) 05/19/2019   Anemia 12/21/2018   Uncontrolled type 2 diabetes mellitus with hyperglycemia ([place]) 12/21/2018   Anxiety 07/19/2018   Depression 07/19/2018   Essential hypertension 07/19/2018   Fibroid uterus 09/17/2015   Menorrhagia 08/27/2015   Type 2 diabetes mellitus without complication, with long-term current use of insulin ([place]) 09/13/2012   BMI 60.0-69.9, adult ([place]) 09/13/2012   Asthma 09/13/2012   History of high blood pressure 09/13/2012   OSA (obstructive sleep apnea) 12/10/2010    Past Surgical History:  Procedure Laterality Date   CESAREAN SECTION     DILATION AND  CURETTAGE OF UTERUS     TUBAL LIGATION      OB History    Gravida  4   Para  2   Term  1   Preterm  1   AB  2   Living  3     SAB  2   IAB  0   Ectopic  0   Multiple  1   Live Births  3            Home Medications    Prior to Admission medications   Medication Sig Start Date End Date Taking? Authorizing Provider  atorvastatin (LIPITOR) 10 MG tablet TAKE 1 TABLET(10 MG) BY MOUTH AT BEDTIME 06/04/20  Yes Minette Brine, FNP  buPROPion (WELLBUTRIN XL) 300 MG 24 hr tablet Take 1 tablet (300 mg total) by mouth at bedtime. Take 1 tab by mouth every other day x 2 weeks then take 1 tab by mouth every second day x 2 weeks then discontinue 05/08/20 05/08/21 Yes Minette Brine, FNP  cetirizine (ZYRTEC ALLERGY) 10 MG tablet Take 1 tablet (10 mg total) by mouth daily. 08/31/19  Yes Minette Brine, FNP  dapagliflozin propanediol (FARXIGA) 5 MG TABS tablet Take 1 tablet (5 mg total) by mouth daily before breakfast. 05/08/20  Yes Minette Brine, FNP  insulin degludec (TRESIBA FLEXTOUCH) 100 UNIT/ML FlexTouch Pen Inject 0.35 mLs (35 Units total) into  the skin daily. 02/29/20  Yes Minette Brine, FNP  losartan-hydrochlorothiazide (HYZAAR) 50-12.5 MG tablet Take 1 tablet by mouth daily. 03/29/20 06/27/20 Yes Minette Brine, FNP  metFORMIN (GLUCOPHAGE-XR) 750 MG 24 hr tablet TAKE 1 TABLET(750 MG) BY MOUTH IN THE MORNING AND AT BEDTIME 06/04/20  Yes Minette Brine, FNP  propranolol (INDERAL) 10 MG tablet Take 1 tablet (10 mg total) by mouth every evening. 05/08/20 05/08/21 Yes Minette Brine, FNP  Semaglutide, 1 MG/DOSE, (OZEMPIC, 1 MG/DOSE,) 4 MG/3ML SOPN Inject 0.75 mLs (1 mg total) into the skin once a week. 02/29/20  Yes Minette Brine, FNP  albuterol (PROVENTIL) (5 MG/ML) 0.5% nebulizer solution Take 0.5 mLs (2.5 mg total) by nebulization every 6 (six) hours as needed for wheezing or shortness of breath. 04/15/19   Zigmund Gottron, NP  albuterol (VENTOLIN HFA) 108 (90 Base) MCG/ACT inhaler Inhale 1-2  puffs into the lungs every 6 (six) hours as needed for wheezing or shortness of breath. 07/23/20   Rutledge Selsor C, [name]  benzonatate (TESSALON) 200 MG capsule Take 1 capsule (200 mg total) by mouth 3 (three) times daily as needed for up to 7 days for cough. 07/23/20 07/30/20  Shamarcus Hoheisel C, [name]  blood glucose meter kit and supplies KIT Dispense based on patient and insurance preference. Use up to four times daily as directed. (FOR ICD-9 250.00, 250.01). 07/23/20   Lydian Chavous C, [name]  diclofenac (VOLTAREN) 50 MG EC tablet Take 1 tablet (50 mg total) by mouth 3 (three) times daily. 07/11/19   Woodroe Mode, MD  doxycycline (VIBRAMYCIN) 100 MG capsule Take 1 capsule (100 mg total) by mouth 2 (two) times daily for 7 days. 07/23/20 07/30/20  Shakeitha Umbaugh C, [name]  ferrous sulfate 325 (65 FE) MG EC tablet Take 1 tablet (325 mg total) by mouth 3 (three) times daily with meals. 10/31/19 10/30/20  Rodriguez-Southworth, Sunday Spillers, [name]  fluconazole (DIFLUCAN) 150 MG tablet Take 1 tablet at the onset of symptoms then repeat in 5 days. 05/09/20   Minette Brine, FNP  fluticasone (FLONASE) 50 MCG/ACT nasal spray Place 1-2 sprays into both nostrils daily. 07/23/20   Mayra Brahm C, [name]  glucose blood test strip USE TO CHECK BLOOD SUGARS UP TO 4 TIMES DAILY  DX CODE:E11.9 05/05/19   Rodriguez-Southworth, Sunday Spillers, [name]  Lancets (ACCU-CHEK MULTICLIX) lancets USE TO CHECK BLOOD SUGARS UP TO 4 TIMES DAILY  DX CODE:E11.9 05/05/19   Rodriguez-Southworth, Sunday Spillers, [name]  levonorgestrel (MIRENA) 20 MCG/24HR IUD 1 each by Intrauterine route once.    [provider]  Naltrexone-buPROPion HCl ER 8-90 MG TB12 Take 1 tablet by mouth daily for 1 week then increase to 1 tablet by mouth twice a day x 1 week then increase to 1 tab by mouth am and 2 tabs by mouth pm x 1 week then 2 tabs by mouth twice a day 05/08/20   Minette Brine, FNP  predniSONE (DELTASONE) 20 MG tablet Take 2 tablets (40 mg total) by mouth daily  for 5 days. 07/23/20 07/28/20  Irish Breisch, Elesa Hacker, [name]    Family History Family History  Problem Relation Age of Onset   Diabetes Father    Allergies Mother    Asthma Mother    Hypertension Mother    Diabetes Mother    Diabetes Maternal Grandmother    Heart disease Maternal Grandmother    Anesthesia problems Neg Hx    Hypotension Neg Hx    Malignant hyperthermia Neg Hx    Pseudochol deficiency Neg Hx  Hearing loss Neg Hx     Social History Social History   Tobacco Use   Smoking status: Former Smoker    Quit date: 08/08/2019    Years since quitting: 0.9   Smokeless tobacco: Never Used  Substance Use Topics   Alcohol use: No    Comment: occasional   Drug use: No     Allergies   Other, Penicillins, and Bactrim [sulfamethoxazole-trimethoprim]   Review of Systems Review of Systems  Constitutional: Negative for activity change, appetite change, chills, fatigue and fever.  HENT: Positive for congestion, rhinorrhea and sore throat. Negative for ear pain, sinus pressure and trouble swallowing.   Eyes: Negative for discharge and redness.  Respiratory: Positive for cough and shortness of breath. Negative for chest tightness.   Cardiovascular: Negative for chest pain.  Gastrointestinal: Negative for abdominal pain, diarrhea, nausea and vomiting.  Musculoskeletal: Negative for myalgias.  Skin: Negative for rash.  Neurological: Negative for dizziness, light-headedness and headaches.     Physical Exam Triage Vital Signs ED Triage Vitals [07/23/20 1650]  Enc Vitals Group     BP      Pulse      Resp      Temp      Temp src      SpO2      Weight      Height      Head Circumference      Peak Flow      Pain Score 0     Pain Loc      Pain Edu?      Excl. in [place]?    No data found.  Updated Vital Signs BP (!) 147/100 (BP Location: Left Wrist)    Pulse 92    Temp 98.9 F (37.2 C) (Oral)    Resp 20    SpO2 97%   Visual Acuity Right Eye Distance:    Left Eye Distance:   Bilateral Distance:    Right Eye Near:   Left Eye Near:    Bilateral Near:     Physical Exam Vitals and nursing note reviewed.  Constitutional:      Appearance: She is well-developed and well-nourished.     Comments: No acute distress  HENT:     Head: Normocephalic and atraumatic.     Ears:     Comments: Bilateral ears without tenderness to palpation of external auricle, tragus and mastoid, EAC's without erythema or swelling, bilateral TMs opaque and slightly bulging, nonerythematous     Nose: Nose normal.     Mouth/Throat:     Comments: Oral mucosa pink and moist, no tonsillar enlargement or exudate. Posterior pharynx patent and nonerythematous, no uvula deviation or swelling. Normal phonation. Eyes:     Conjunctiva/sclera: Conjunctivae normal.  Cardiovascular:     Rate and Rhythm: Normal rate and regular rhythm.  Pulmonary:     Effort: Pulmonary effort is normal. No respiratory distress.     Comments: Breathing comfortably at rest, CTABL, no wheezing, rales or other adventitious sounds auscultated Abdominal:     General: There is no distension.  Musculoskeletal:        General: Normal range of motion.     Cervical back: Neck supple.  Skin:    General: Skin is warm and dry.  Neurological:     Mental Status: She is alert and oriented to person, place, and time.  Psychiatric:        Mood and Affect: Mood and affect normal.  UC Treatments / Results  Labs (all labs ordered are listed, but only abnormal results are displayed) Labs Reviewed  POCT FASTING CBG [facility] MANUAL ENTRY - Abnormal; Notable for the following components:      Result Value   POCT Glucose ([facility]) 213 (*)    All other components within normal limits  NOVEL CORONAVIRUS, NAA    EKG   Radiology No results found.  Procedures Procedures (including critical care time)  Medications Ordered in UC Medications - No data to display  Initial Impression / Assessment and Plan /  UC Course  I have reviewed the triage vital signs and the nursing notes.  Pertinent labs & imaging results that were available during my care of the patient were reviewed by me and considered in my medical decision making (see chart for details).     Treating for sinusitis/atypical infection given length of symptoms, Tessalon for cough.  Prednisone to have on hand if needed for asthma, continue albuterol inhaler.  Sugars elevated keep a close eye on blood sugars while on prednisone.  Mucinex DM and Flonase for further relief of congestion/cough.  Covid test pending. Discussed strict return precautions. Patient verbalized understanding and is agreeable with plan.   Final Clinical Impressions(s) / UC Diagnoses   Final diagnoses:  Acute sinusitis with symptoms > 10 days  Mild intermittent asthma with acute exacerbation     Discharge Instructions     Covid test pending, monitor my chart for results Begin doxycycline to cover sinus infection as well as infection in lungs Tessalon for cough May begin prednisone but keep a close eye on blood sugars and take diabetes medicine as prescribed Albuterol inhaler as needed May supplement with Mucinex DM for further relief of cough and congestion     ED Prescriptions    Medication Sig Dispense Auth. Provider   doxycycline (VIBRAMYCIN) 100 MG capsule Take 1 capsule (100 mg total) by mouth 2 (two) times daily for 7 days. 14 capsule Kellin Fifer C, [name]   albuterol (VENTOLIN HFA) 108 (90 Base) MCG/ACT inhaler Inhale 1-2 puffs into the lungs every 6 (six) hours as needed for wheezing or shortness of breath. 18 g Deby Adger C, [name]   benzonatate (TESSALON) 200 MG capsule Take 1 capsule (200 mg total) by mouth 3 (three) times daily as needed for up to 7 days for cough. 28 capsule Arnulfo Batson C, [name]   fluticasone (FLONASE) 50 MCG/ACT nasal spray Place 1-2 sprays into both nostrils daily. 16 g Nazario Russom C, [name]   blood glucose meter  kit and supplies KIT Dispense based on patient and insurance preference. Use up to four times daily as directed. (FOR ICD-9 250.00, 250.01). 1 each Obediah Welles C, [name]   predniSONE (DELTASONE) 20 MG tablet Take 2 tablets (40 mg total) by mouth daily for 5 days. 10 tablet Alonie Gazzola, [place] C, [name]     PDMP not reviewed this encounter.   Janith Lima, [name] 07/23/20 1717    Janith Lima, [name] 07/23/20 1744

## 2020-07-25 LAB — SARS-COV-2, NAA 2 DAY TAT

## 2020-07-25 LAB — NOVEL CORONAVIRUS, NAA: SARS-CoV-2, NAA: NOT DETECTED

## 2020-08-06 ENCOUNTER — Ambulatory Visit: Payer: Medicaid Other | Admitting: Podiatry

## 2020-08-06 ENCOUNTER — Telehealth: Payer: Medicaid Other

## 2020-08-06 ENCOUNTER — Ambulatory Visit: Payer: Self-pay

## 2020-08-06 ENCOUNTER — Encounter: Payer: Self-pay | Admitting: Podiatry

## 2020-08-06 ENCOUNTER — Other Ambulatory Visit: Payer: Self-pay

## 2020-08-06 DIAGNOSIS — D492 Neoplasm of unspecified behavior of bone, soft tissue, and skin: Secondary | ICD-10-CM

## 2020-08-06 DIAGNOSIS — Z794 Long term (current) use of insulin: Secondary | ICD-10-CM

## 2020-08-06 DIAGNOSIS — E119 Type 2 diabetes mellitus without complications: Secondary | ICD-10-CM

## 2020-08-06 DIAGNOSIS — D169 Benign neoplasm of bone and articular cartilage, unspecified: Secondary | ICD-10-CM

## 2020-08-06 DIAGNOSIS — I10 Essential (primary) hypertension: Secondary | ICD-10-CM

## 2020-08-06 DIAGNOSIS — E1165 Type 2 diabetes mellitus with hyperglycemia: Secondary | ICD-10-CM

## 2020-08-06 DIAGNOSIS — F419 Anxiety disorder, unspecified: Secondary | ICD-10-CM

## 2020-08-06 DIAGNOSIS — F32A Depression, unspecified: Secondary | ICD-10-CM

## 2020-08-07 ENCOUNTER — Telehealth: Payer: Self-pay | Admitting: *Deleted

## 2020-08-07 NOTE — Chronic Care Management (AMB) (Signed)
  Care Management   Note  08/07/2020 Name: [name] MRN: [id number] DOB: September 11, 1980  [name] is a 40 y.o. year old female who is a primary care patient of Arnette Felts, FNP. I reached out to Venita Lick by phone today in response to a referral sent by [name] PCP, Arnette Felts, FNP.  [name] was given information about care management services today including:  1. Care management services include personalized support from designated clinical staff supervised by her physician, including individualized plan of care and coordination with other care providers 2. 24/7 contact phone numbers for assistance for urgent and routine care needs. 3. The patient may stop care management services at any time by phone call to the office staff.  Patient agreed to services and verbal consent obtained.   Follow up plan: Telephone appointment with care management team member scheduled for: 08/30/2020  [facility] Guide, Embedded Care Coordination [facility] Management

## 2020-08-07 NOTE — Chronic Care Management (AMB) (Signed)
  Care Management   Outreach Note  08/07/2020 Name: MOMOKO SLEZAK MRN: [id number] DOB: 1981-05-07  ARGUSTA MCGANN is a 40 y.o. year old female who is a primary care patient of Arnette Felts, FNP. I reached out to Venita Lick by phone today in response to a referral sent by [name] PCP, Arnette Felts, FNP.     An unsuccessful telephone outreach was attempted today. The patient was referred to the case management team for assistance with care management and care coordination.   Follow Up Plan: A HIPAA compliant phone message was left for the patient providing contact information and requesting a return call.  The care management team will reach out to the patient again over the next 7 days. If patient returns call to provider office, please advise to call Embedded Care Management Care Guide Gwenevere Ghazi at [phone].  Gwenevere Ghazi  Care Guide, Embedded Care Coordination [facility] Management  Direct Dial: [phone]

## 2020-08-07 NOTE — Progress Notes (Signed)
Subjective:   Patient ID: [name], female   DOB: 40 y.o.   MRN: [id number]   HPI Patient presents stating they become sore again I am not sure what we can do long-term neuro   ROS      Objective:  Physical Exam  Vascular status intact with patient found to have exostotic area around the hallux left over right with thick keratotic tissue that forms     Assessment:  Lesion secondary to structural position of the hallux with obesity is complicating factor     Plan:  H&P education rendered debridement accomplished sterile dressing to the left with a slight bleeding and instructed on soaks.  This can be done on a routine basis but ultimately may require more aggressive treatment and I did recommend pedicures to try to keep it under control

## 2020-08-08 ENCOUNTER — Other Ambulatory Visit: Payer: Self-pay | Admitting: Nurse Practitioner

## 2020-08-08 ENCOUNTER — Other Ambulatory Visit: Payer: Self-pay

## 2020-08-08 MED ORDER — FLUCONAZOLE 150 MG PO TABS: ORAL_TABLET | ORAL | 0 refills | Status: DC

## 2020-08-08 NOTE — Chronic Care Management (AMB) (Signed)
Chronic Care Management   Follow Up Note   08/08/2020 Name: [name] MRN: [id number] DOB: [birth year]  Referred by: Minette Brine, FNP Reason for referral : Care Coordination ([place] )   [name] is a 40 y.o. year old female who is a primary care patient of Minette Brine, [place]. The CCM team was consulted for assistance with chronic disease management and care coordination needs.    Review of patient status, including review of consultants reports, relevant laboratory and other test results, and collaboration with appropriate care team members and the patient's provider was performed as part of comprehensive patient evaluation and provision of chronic care management services.    SDOH (Social Determinants of Health) assessments performed: Yes - Pharm D referral  See Care Plan activities for detailed interventions related to [place])   Placed outbound CC RN CM follow up call to patient for a care plan update.    Outpatient Encounter Medications as of 08/06/2020  Medication Sig  . albuterol (PROVENTIL) (5 MG/ML) 0.5% nebulizer solution Take 0.5 mLs (2.5 mg total) by nebulization every 6 (six) hours as needed for wheezing or shortness of breath.  [name] albuterol (VENTOLIN HFA) 108 (90 Base) MCG/ACT inhaler Inhale 1-2 puffs into the lungs every 6 (six) hours as needed for wheezing or shortness of breath.  [name] atorvastatin (LIPITOR) 10 MG tablet TAKE 1 TABLET(10 MG) BY MOUTH AT BEDTIME  . blood glucose meter kit and supplies KIT Dispense based on patient and insurance preference. Use up to four times daily as directed. (FOR ICD-9 250.00, 250.01).  [name] buPROPion (WELLBUTRIN XL) 300 MG 24 hr tablet Take 1 tablet (300 mg total) by mouth at bedtime. Take 1 tab by mouth every other day x 2 weeks then take 1 tab by mouth every second day x 2 weeks then discontinue  . cetirizine (ZYRTEC ALLERGY) 10 MG tablet Take 1 tablet (10 mg total) by mouth daily.  . clindamycin (CLEOCIN) 300 MG capsule Take  300 mg by mouth every 6 (six) hours.  . dapagliflozin propanediol (FARXIGA) 5 MG TABS tablet Take 1 tablet (5 mg total) by mouth daily before breakfast.  . diclofenac (VOLTAREN) 50 MG EC tablet Take 1 tablet (50 mg total) by mouth 3 (three) times daily.  . ferrous sulfate 325 (65 FE) MG EC tablet Take 1 tablet (325 mg total) by mouth 3 (three) times daily with meals.  . fluconazole (DIFLUCAN) 150 MG tablet Take 1 tablet at the onset of symptoms then repeat in 5 days.  . fluticasone (FLONASE) 50 MCG/ACT nasal spray Place 1-2 sprays into both nostrils daily.  [name] glucose blood test strip USE TO CHECK BLOOD SUGARS UP TO 4 TIMES DAILY  DX CODE:E11.9  . ibuprofen (ADVIL) 800 MG tablet Take 800 mg by mouth 3 (three) times daily.  . insulin degludec (TRESIBA FLEXTOUCH) 100 UNIT/ML FlexTouch Pen Inject 0.35 mLs (35 Units total) into the skin daily.  . Lancets (ACCU-CHEK MULTICLIX) lancets USE TO CHECK BLOOD SUGARS UP TO 4 TIMES DAILY  DX CODE:E11.9  . levonorgestrel (MIRENA) 20 MCG/24HR IUD 1 each by Intrauterine route once.  [name] losartan-hydrochlorothiazide (HYZAAR) 50-12.5 MG tablet Take 1 tablet by mouth daily.  . metFORMIN (GLUCOPHAGE-XR) 750 MG 24 hr tablet TAKE 1 TABLET(750 MG) BY MOUTH IN THE MORNING AND AT BEDTIME  . Naltrexone-buPROPion HCl ER 8-90 MG TB12 Take 1 tablet by mouth daily for 1 week then increase to 1 tablet by mouth twice a day x 1  week then increase to 1 tab by mouth am and 2 tabs by mouth pm x 1 week then 2 tabs by mouth twice a day  . propranolol (INDERAL) 10 MG tablet Take 1 tablet (10 mg total) by mouth every evening.  . Semaglutide, 1 MG/DOSE, (OZEMPIC, 1 MG/DOSE,) 4 MG/3ML SOPN Inject 0.75 mLs (1 mg total) into the skin once a week.   No facility-administered encounter medications on file as of 08/06/2020.     Objective:  Lab Results  Component Value Date   HGBA1C 10.1 (H) 05/08/2020   HGBA1C 11.6 (H) 02/29/2020   HGBA1C 11.2 (H) 11/30/2019   Lab Results  Component Value  Date   MICROALBUR 30 05/08/2020   LDLCALC 85 05/08/2020   CREATININE 0.79 05/08/2020   BP Readings from Last 3 Encounters:  07/23/20 (!) 147/100  05/08/20 138/80  02/29/20 (!) 136/90    Goals Addressed     Patient Care Plan: Diabetes Type 2 (Adult)    Problem Identified: Glycemic Management (Diabetes, Type 2)   Priority: High    Long-Range Goal: Glycemic Management Optimized   Start Date: 08/06/2020  Expected End Date: 11/05/2020  This Visit's Progress: On track  Priority: High  Note:   Objective:  Lab Results  Component Value Date   HGBA1C 10.1 (H) 05/08/2020 .   Lab Results  Component Value Date   CREATININE 0.79 05/08/2020   CREATININE 0.80 02/29/2020   CREATININE 0.91 11/30/2019 .   [name] No results found for: EGFR Current Barriers:  [name] Knowledge Deficits related to basic Diabetes pathophysiology and self care/management . Knowledge Deficits related to medications used for management of diabetes Case Manager Clinical Goal(s):  [name] Over the next 90 days, patient will demonstrate improved adherence to prescribed treatment plan for diabetes self care/management as evidenced by:  . daily monitoring and recording of CBG  . adherence to ADA/ carb modified diet . exercise 3-5 days/week . adherence to prescribed medication regimen Interventions:  . Provided education to patient about basic DM disease process . Reviewed medications with patient and discussed importance of medication adherence . Discussed plans with patient for ongoing care management follow up and provided patient with direct contact information for care management team . Provided patient with written educational materials related to hypo and hyperglycemia and importance of correct treatment . Review of patient status, including review of consultants reports, relevant laboratory and other test results, and medications completed. Durene Cal in basket message to PCP provider requesting Rx be sent for a new  glucometer Patient Goals/Self-Care Activities . Over the next 90 days, patient will:  - take prescribed medications as directed by PCP/Pharm D  - Obtain new glucometer from pharmacy  - set target A1c  Follow Up Plan: Telephone follow up appointment with care management team member scheduled for: 09/17/20    Problem Identified: Disease Progression (Diabetes, Type 2)   Priority: High    Long-Range Goal: Disease Progression Prevented or Minimized   Start Date: 08/06/2020  Expected End Date: 11/05/2020  This Visit's Progress: On track  Priority: High  Note:   Objective:  Lab Results  Component Value Date   HGBA1C 10.1 (H) 05/08/2020 .   Lab Results  Component Value Date   CREATININE 0.79 05/08/2020   CREATININE 0.80 02/29/2020   CREATININE 0.91 11/30/2019 .   [name] No results found for: EGFR Current Barriers:  [name] Knowledge Deficits related to basic Diabetes pathophysiology and self care/management . Knowledge Deficits related to medications used for management  of diabetes Case Manager Clinical Goal(s):  [name] Over the next 90 days, patient will demonstrate improved adherence to prescribed treatment plan for diabetes self care/management as evidenced by:  . daily monitoring and recording of CBG  . adherence to ADA/ carb modified diet . exercise 3-5 days/week . adherence to prescribed medication regimen Interventions:  . Provided education to patient about basic DM disease process . Reviewed medications with patient and discussed importance of medication adherence . Discussed plans with patient for ongoing care management follow up and provided patient with direct contact information for care management team . Provided patient with written educational materials related to hypo and hyperglycemia and importance of correct treatment . Review of patient status, including review of consultants reports, relevant laboratory and other test results, and medications completed. Patient Goals/Self-Care  Activities . Over the next 90 days, patient will:  - Self administers oral medications as prescribed - Checks blood sugars as prescribed and utilize hyper and hypoglycemia protocol as needed - Adheres to prescribed ADA/carb modified - check blood sugar at prescribed times - check blood sugar if I feel it is too high or too low - take the blood sugar log to all doctor visits - take the blood sugar meter to all doctor visits  - follow dietary and exercise recommendations   Follow Up Plan: Telephone follow up appointment with care management team member scheduled for: 09/17/20    Patient Care Plan: Obesity (Adult)    Problem Identified: Readiness for Weight Management   Priority: High    Long-Range Goal: Plan for Weight Management Developed   Start Date: 08/06/2020  Expected End Date: 11/05/2020  This Visit's Progress: On track  Priority: High  Note:   Current Barriers:  [name] Knowledge Deficits related to weight loss strategies as evidenced by weight gain reported/observed and/or altered eating habits and response or side effect of medical treatment or medication  . Chronic Disease Management support and education needs related to weight management in patient with HTN, DM, Depression, and Obesity  . Cannot afford weight loss drug  . Knowledge Deficits related to weight loss management  . Chronic Disease Management support and education needs related to DM, HTN, Obesity, Depression  . 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 Evaluation of current treatment plan related to weight loss and patient's adherence to plan as established by provider . Provided  verbal and/or written education to patient re: recommended life style changes: avoid fad diets, make small/incremental dietary and exercise changes, eat at the table and avoid eating in front of the TV, plan management of cravings, monitor snacking and cravings in food diary . Determined patient was unable to start Contrave as prescribed by PCP due to financial strain, she is unable to afford the $100 copay for this medication . Sent embedded Pharm D referral requesting assistance to help determined if help is available for drug cost of Contrave  . Discussed plans with patient for ongoing care management follow up and provided patient with direct contact information for care management team:  Patient Goals/Self-Care Activities . Over the next 90 days, patient will:   - Patient will self administer medications as prescribed Patient will attend all scheduled provider appointments Patient will  call provider office for new concerns or questions - set weight loss goal  - work with embedded Pharm D for assistance with medication cost of Contrave   Follow Up Plan: Telephone follow up appointment with care management team member scheduled for: 09/17/20     Plan:   Telephone follow up appointment with care management team member scheduled for: 09/17/20  Barb Merino, RN, BSN, CCM Care Management Coordinator [place]  Direct Phone: [phone]

## 2020-08-08 NOTE — Patient Instructions (Signed)
Visit Information      Other   .  Monitor and Manage My Blood Sugar-Diabetes Type 2      Timeframe:  Long-Range Goal Priority:  High Start Date:  08/06/20                          Expected End Date:  11/04/20                 Follow Up Date 09/17/20   - Self administers oral medications as prescribed - Checks blood sugars as prescribed and utilize hyper and hypoglycemia protocol as needed - Adheres to prescribed ADA/carb modified - check blood sugar at prescribed times - check blood sugar if I feel it is too high or too low - take the blood sugar log to all doctor visits - take the blood sugar meter to all doctor visits  - follow dietary and exercise recommendations  - take prescribed medications as directed by PCP/Pharm D  - Obtain new glucometer from pharmacy    Why is this important?    Checking your blood sugar at home helps to keep it from getting very high or very low.   Writing the results in a diary or log helps the doctor know how to care for you.   Your blood sugar log should have the time, date and the results.   Also, write down the amount of insulin or other medicine that you take.   Other information, like what you ate, exercise done and how you were feeling, will also be helpful.     Notes:     .  Set My Target A1C-Diabetes Type 2      Timeframe:  Long-Range Goal Priority:  High Start Date: 08/06/20                            Expected End Date: 11/04/20                      Follow Up Date:  09/17/20   - take prescribed medications as directed by PCP/Pharm D  - Obtain new glucometer from pharmacy  - set target A1c   Why is this important?    Your target A1C is decided together by you and your doctor.   It is based on several things like your age and other health issues.    Notes:     .  Set My Weight Loss Goal      Timeframe:  Long-Range Goal Priority:  High Start Date:  08/06/20                           Expected End Date: 11/04/20                        Follow Up Date 09/17/20   - set weight loss goal  - work with embedded Pharm D for assistance with medication cost of Contrave  - Patient will self administer medications as prescribed - Patient will attend all scheduled provider appointments - Patient will call provider office for new concerns or questions  Why is this important?   Losing only 5 to 15 percent of your weight makes a big difference in your health.    Notes:       The patient verbalized understanding of instructions, educational materials, and  care plan provided today and declined offer to receive copy of patient instructions, educational materials, and care plan.   Telephone follow up appointment with care management team member scheduled for: 09/17/20  Riley Churches, RN

## 2020-08-23 ENCOUNTER — Ambulatory Visit: Payer: Medicaid Other | Admitting: Podiatry

## 2020-08-24 ENCOUNTER — Ambulatory Visit: Payer: Self-pay

## 2020-08-24 ENCOUNTER — Telehealth: Payer: Self-pay

## 2020-08-24 NOTE — Chronic Care Management (AMB) (Signed)
Chronic Care Management Pharmacy Assistant   Name: [name]  MRN: [id number] DOB: 26-Apr-1981  Reason for Encounter: Medication Review / Initial Questions for Pharmacist visit on 08/30/2020 '@2' :30 pm.    PCP : Stephanie Brine, FNP  Allergies:   Allergies  Allergen Reactions   Other Anaphylaxis and Other (See Comments)    Pt has fish allergy Pt has allergy to butter cookies   Penicillins Rash and Other (See Comments)    Told MD that PCN reaction is rash and that she has taken amoxicillin before   Bactrim [Sulfamethoxazole-Trimethoprim]     Diarrhea, abdominal cramps and rash    Medications: Outpatient Encounter Medications as of 08/24/2020  Medication Sig   albuterol (PROVENTIL) (5 MG/ML) 0.5% nebulizer solution Take 0.5 mLs (2.5 mg total) by nebulization every 6 (six) hours as needed for wheezing or shortness of breath.   albuterol (VENTOLIN HFA) 108 (90 Base) MCG/ACT inhaler Inhale 1-2 puffs into the lungs every 6 (six) hours as needed for wheezing or shortness of breath.   atorvastatin (LIPITOR) 10 MG tablet TAKE 1 TABLET(10 MG) BY MOUTH AT BEDTIME   blood glucose meter kit and supplies KIT Dispense based on patient and insurance preference. Use up to four times daily as directed. (FOR ICD-9 250.00, 250.01).   buPROPion (WELLBUTRIN XL) 300 MG 24 hr tablet Take 1 tablet (300 mg total) by mouth at bedtime. Take 1 tab by mouth every other day x 2 weeks then take 1 tab by mouth every second day x 2 weeks then discontinue   cetirizine (ZYRTEC ALLERGY) 10 MG tablet Take 1 tablet (10 mg total) by mouth daily.   clindamycin (CLEOCIN) 300 MG capsule Take 300 mg by mouth every 6 (six) hours.   dapagliflozin propanediol (FARXIGA) 5 MG TABS tablet Take 1 tablet (5 mg total) by mouth daily before breakfast.   diclofenac (VOLTAREN) 50 MG EC tablet Take 1 tablet (50 mg total) by mouth 3 (three) times daily.   ferrous sulfate 325 (65 FE) MG EC tablet Take 1 tablet (325 mg total)  by mouth 3 (three) times daily with meals.   fluconazole (DIFLUCAN) 150 MG tablet Take 1 tablet at the onset of symptoms then repeat in 5 days.   fluticasone (FLONASE) 50 MCG/ACT nasal spray Place 1-2 sprays into both nostrils daily.   glucose blood test strip USE TO CHECK BLOOD SUGARS UP TO 4 TIMES DAILY  DX CODE:E11.9   ibuprofen (ADVIL) 800 MG tablet Take 800 mg by mouth 3 (three) times daily.   insulin degludec (TRESIBA FLEXTOUCH) 100 UNIT/ML FlexTouch Pen Inject 0.35 mLs (35 Units total) into the skin daily.   Lancets (ACCU-CHEK MULTICLIX) lancets USE TO CHECK BLOOD SUGARS UP TO 4 TIMES DAILY  DX CODE:E11.9   levonorgestrel (MIRENA) 20 MCG/24HR IUD 1 each by Intrauterine route once.   losartan-hydrochlorothiazide (HYZAAR) 50-12.5 MG tablet Take 1 tablet by mouth daily.   metFORMIN (GLUCOPHAGE-XR) 750 MG 24 hr tablet TAKE 1 TABLET(750 MG) BY MOUTH IN THE MORNING AND AT BEDTIME   Naltrexone-buPROPion HCl ER 8-90 MG TB12 Take 1 tablet by mouth daily for 1 week then increase to 1 tablet by mouth twice a day x 1 week then increase to 1 tab by mouth am and 2 tabs by mouth pm x 1 week then 2 tabs by mouth twice a day   propranolol (INDERAL) 10 MG tablet Take 1 tablet (10 mg total) by mouth every evening.   Semaglutide, 1 MG/DOSE, (OZEMPIC, 1  MG/DOSE,) 4 MG/3ML SOPN Inject 0.75 mLs (1 mg total) into the skin once a week.   No facility-administered encounter medications on file as of 08/24/2020.    Current Diagnosis: Patient Active Problem List   Diagnosis Date Noted   Class 3 severe obesity due to excess calories with serious comorbidity and body mass index (BMI) of 50.0 to 59.9 in adult ([place]) 05/19/2019   Anemia 12/21/2018   Uncontrolled type 2 diabetes mellitus with hyperglycemia ([place]) 12/21/2018   Anxiety 07/19/2018   Depression 07/19/2018   Essential hypertension 07/19/2018   Fibroid uterus 09/17/2015   Menorrhagia 08/27/2015   Type 2 diabetes mellitus without  complication, with long-term current use of insulin ([place]) 09/13/2012   BMI 60.0-69.9, adult ([place]) 09/13/2012   Asthma 09/13/2012   History of high blood pressure 09/13/2012   OSA (obstructive sleep apnea) 12/10/2010      Follow-Up:  Pharmacist Review - Called patient to do her Initial questions for visit with [name], CPP- Patient states she is looking forward to visit. Patient was reminded to have her medications available at time of visit.  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 I told her that she could just talk with [name], CPP today. Several attempts were made to reach out to patient before visit.  [name],CPP notified.  [name], [facility] Clinical Pharmacist Assistant [phone]

## 2020-08-27 ENCOUNTER — Other Ambulatory Visit: Payer: Self-pay

## 2020-08-27 ENCOUNTER — Ambulatory Visit (INDEPENDENT_AMBULATORY_CARE_PROVIDER_SITE_OTHER): Payer: Medicaid Other | Admitting: Podiatry

## 2020-08-27 DIAGNOSIS — Z794 Long term (current) use of insulin: Secondary | ICD-10-CM

## 2020-08-27 DIAGNOSIS — D169 Benign neoplasm of bone and articular cartilage, unspecified: Secondary | ICD-10-CM

## 2020-08-27 DIAGNOSIS — E119 Type 2 diabetes mellitus without complications: Secondary | ICD-10-CM | POA: Diagnosis not present

## 2020-08-28 ENCOUNTER — Ambulatory Visit (INDEPENDENT_AMBULATORY_CARE_PROVIDER_SITE_OTHER): Payer: Medicaid Other | Admitting: Nurse Practitioner

## 2020-08-28 ENCOUNTER — Encounter: Payer: Self-pay | Admitting: Nurse Practitioner

## 2020-08-28 ENCOUNTER — Ambulatory Visit: Payer: Self-pay

## 2020-08-28 VITALS — BP 128/80 | HR 108 | Temp 98.6°F | Ht 67.4 in | Wt 358.4 lb

## 2020-08-28 DIAGNOSIS — E782 Mixed hyperlipidemia: Secondary | ICD-10-CM | POA: Diagnosis not present

## 2020-08-28 DIAGNOSIS — I10 Essential (primary) hypertension: Secondary | ICD-10-CM | POA: Diagnosis not present

## 2020-08-28 DIAGNOSIS — R21 Rash and other nonspecific skin eruption: Secondary | ICD-10-CM

## 2020-08-28 DIAGNOSIS — Z111 Encounter for screening for respiratory tuberculosis: Secondary | ICD-10-CM

## 2020-08-28 DIAGNOSIS — E1165 Type 2 diabetes mellitus with hyperglycemia: Secondary | ICD-10-CM | POA: Diagnosis not present

## 2020-08-28 MED ORDER — LOSARTAN POTASSIUM-HCTZ 50-12.5 MG PO TABS: 1.0000 | ORAL_TABLET | Freq: Every day | ORAL | 1 refills | Status: DC

## 2020-08-28 MED ORDER — OZEMPIC (1 MG/DOSE) 4 MG/3ML ~~LOC~~ SOPN: 1.0000 mg | PEN_INJECTOR | SUBCUTANEOUS | 1 refills | Status: DC

## 2020-08-28 MED ORDER — TRIAMCINOLONE ACETONIDE 0.025 % EX OINT: 1.0000 "application " | TOPICAL_OINTMENT | Freq: Two times a day (BID) | CUTANEOUS | 0 refills | Status: DC

## 2020-08-28 NOTE — Progress Notes (Signed)
I,Tianna Badgett,acting as a Education administrator for [employer], FNP.,have documented all relevant documentation on the behalf of Minette Brine, FNP,as directed by  Minette Brine, FNP while in the presence of Minette Brine, [place].  This visit occurred during the SARS-CoV-2 public health emergency.  Safety protocols were in place, including screening questions prior to the visit, additional usage of staff PPE, and extensive cleaning of exam room while observing appropriate contact time as indicated for disinfecting solutions.  Subjective:     Patient ID: [name] , female    DOB: 06-25-1981 , 40 y.o.   MRN: [id number]   Chief Complaint  Patient presents with  . Hypertension  . Diabetes    HPI  Patient is here for a DM f/u. She is complaint with medications. She would like to discuss a new rash on her hips. She would also like to get a quantiferon done today for school.   Diabetes She presents for her follow-up diabetic visit. She has type 2 diabetes mellitus. Her disease course has been worsening. Hypoglycemia symptoms include nervousness/anxiousness. Pertinent negatives for hypoglycemia include no dizziness or headaches. Pertinent negatives for diabetes include no chest pain and no fatigue. There are no hypoglycemic complications. There are no diabetic complications. Risk factors for coronary artery disease include sedentary lifestyle, obesity, stress, hypertension and diabetes mellitus. Current diabetic treatment includes oral agent (monotherapy). She is compliant with treatment some of the time. When asked about meal planning, she reported none. She has not had a previous visit with a dietitian. (Blood sugars are up to 200's)  Hypertension This is a chronic problem. The current episode started more than 1 year ago. The problem has been gradually worsening since onset. The problem is uncontrolled. Pertinent negatives include no chest pain, headaches or palpitations. Risk factors for coronary artery  disease include obesity and sedentary lifestyle.  Rash This is a new problem. The current episode started 1 to 4 weeks ago. The problem is unchanged. The affected locations include the right hip and left hip. The rash is characterized by dryness and scaling. Pertinent negatives include no fatigue. Past treatments include topical steroids. The treatment provided mild relief.     Past Medical History:  Diagnosis Date  . Abnormal Pap smear    f/u wnl  . Asthma   . Fibroid   . Gestational diabetes   . Hypertension   . Morbid obesity ([place])   . Sleep apnea   . Urinary tract infection      Family History  Problem Relation Age of Onset  . Diabetes Father   . Allergies Mother   . Asthma Mother   . Hypertension Mother   . Diabetes Mother   . Diabetes Maternal Grandmother   . Heart disease Maternal Grandmother   . Anesthesia problems Neg Hx   . Hypotension Neg Hx   . Malignant hyperthermia Neg Hx   . Pseudochol deficiency Neg Hx   . Hearing loss Neg Hx      Current Outpatient Medications:  .  triamcinolone (KENALOG) 0.025 % ointment, Apply 1 application topically 2 (two) times daily., Disp: 30 g, Rfl: 0 .  albuterol (PROVENTIL) (5 MG/ML) 0.5% nebulizer solution, Take 0.5 mLs (2.5 mg total) by nebulization every 6 (six) hours as needed for wheezing or shortness of breath., Disp: 20 mL, Rfl: 12 .  albuterol (VENTOLIN HFA) 108 (90 Base) MCG/ACT inhaler, Inhale 1-2 puffs into the lungs every 6 (six) hours as needed for wheezing or shortness of breath., Disp: 18  g, Rfl: 0 .  atorvastatin (LIPITOR) 20 MG tablet, Take 1 tablet (20 mg total) by mouth daily., Disp: 90 tablet, Rfl: 1 .  blood glucose meter kit and supplies KIT, Dispense based on patient and insurance preference. Use up to four times daily as directed. (FOR ICD-9 250.00, 250.01)., Disp: 1 each, Rfl: 0 .  buPROPion (WELLBUTRIN XL) 300 MG 24 hr tablet, Take 1 tablet (300 mg total) by mouth at bedtime. Take 1 tab by mouth every other  day x 2 weeks then take 1 tab by mouth every second day x 2 weeks then discontinue, Disp: 90 tablet, Rfl: 1 .  cetirizine (ZYRTEC ALLERGY) 10 MG tablet, Take 1 tablet (10 mg total) by mouth daily., Disp: 90 tablet, Rfl: 0 .  dapagliflozin propanediol (FARXIGA) 5 MG TABS tablet, Take 1 tablet (5 mg total) by mouth daily before breakfast., Disp: 90 tablet, Rfl: 1 .  diclofenac (VOLTAREN) 50 MG EC tablet, Take 1 tablet (50 mg total) by mouth 3 (three) times daily. (Patient not taking: Reported on 08/30/2020), Disp: 45 tablet, Rfl: 3 .  ferrous sulfate 325 (65 FE) MG EC tablet, Take 1 tablet (325 mg total) by mouth 3 (three) times daily with meals., Disp: 90 tablet, Rfl: 2 .  fluticasone (FLONASE) 50 MCG/ACT nasal spray, Place 1-2 sprays into both nostrils daily., Disp: 16 g, Rfl: 0 .  glucose blood test strip, USE TO CHECK BLOOD SUGARS UP TO 4 TIMES DAILY  DX CODE:E11.9, Disp: 100 each, Rfl: 12 .  ibuprofen (ADVIL) 800 MG tablet, Take 800 mg by mouth 3 (three) times daily. (Patient not taking: Reported on 08/30/2020), Disp: , Rfl:  .  insulin degludec (TRESIBA FLEXTOUCH) 100 UNIT/ML FlexTouch Pen, Inject 0.35 mLs (35 Units total) into the skin daily., Disp: 5 pen, Rfl: 5 .  Lancets (ACCU-CHEK MULTICLIX) lancets, USE TO CHECK BLOOD SUGARS UP TO 4 TIMES DAILY  DX CODE:E11.9, Disp: 100 each, Rfl: 12 .  levonorgestrel (MIRENA) 20 MCG/24HR IUD, 1 each by Intrauterine route once., Disp: , Rfl:  .  losartan-hydrochlorothiazide (HYZAAR) 50-12.5 MG tablet, Take 1 tablet by mouth daily., Disp: 90 tablet, Rfl: 1 .  metFORMIN (GLUCOPHAGE-XR) 750 MG 24 hr tablet, TAKE 1 TABLET(750 MG) BY MOUTH IN THE MORNING AND AT BEDTIME, Disp: 180 tablet, Rfl: 1 .  Naltrexone-buPROPion HCl ER 8-90 MG TB12, Take 1 tablet by mouth daily for 1 week then increase to 1 tablet by mouth twice a day x 1 week then increase to 1 tab by mouth am and 2 tabs by mouth pm x 1 week then 2 tabs by mouth twice a day (Patient not taking: Reported on  08/30/2020), Disp: 120 tablet, Rfl: 1 .  propranolol (INDERAL) 10 MG tablet, Take 1 tablet (10 mg total) by mouth every evening., Disp: 90 tablet, Rfl: 1 .  Semaglutide, 1 MG/DOSE, (OZEMPIC, 1 MG/DOSE,) 4 MG/3ML SOPN, Inject 1 mg into the skin once a week., Disp: 9 mL, Rfl: 1   Allergies  Allergen Reactions  . Other Anaphylaxis and Other (See Comments)    Pt has fish allergy Pt has allergy to butter cookies  . Penicillins Rash and Other (See Comments)    Told MD that PCN reaction is rash and that she has taken amoxicillin before  . Bactrim [Sulfamethoxazole-Trimethoprim]     Diarrhea, abdominal cramps and rash     Review of Systems  Constitutional: Negative.  Negative for fatigue.  Respiratory: Negative.   Cardiovascular: Negative.  Negative for chest pain  and palpitations.  Gastrointestinal: Negative.   Skin: Positive for rash (bilateral hip rash).       Rash on both hips  Neurological: Negative.  Negative for dizziness and headaches.  Psychiatric/Behavioral: The patient is nervous/anxious.      Today's Vitals   08/28/20 1632  BP: 128/80  Pulse: (!) 108  Temp: 98.6 F (37 C)  TempSrc: Oral  Weight: (!) 358 lb 6.4 oz (162.6 kg)  Height: 5' 7.4" (1.712 m)   Body mass index is 55.47 kg/m.   Objective:  Physical Exam Constitutional:      General: She is not in acute distress.    Appearance: Normal appearance. She is obese.  Cardiovascular:     Rate and Rhythm: Normal rate and regular rhythm.     Pulses: Normal pulses.     Heart sounds: Normal heart sounds. No murmur heard.   Pulmonary:     Effort: Pulmonary effort is normal. No respiratory distress.     Breath sounds: Normal breath sounds. No wheezing.  Skin:    Capillary Refill: Capillary refill takes less than 2 seconds.  Neurological:     General: No focal deficit present.     Mental Status: She is alert and oriented to person, place, and time.     Cranial Nerves: No cranial nerve deficit.  Psychiatric:         Mood and Affect: Mood normal.        Behavior: Behavior normal.        Thought Content: Thought content normal.        Judgment: Judgment normal.         Assessment And Plan:     1. Uncontrolled type 2 diabetes mellitus with hyperglycemia (HCC)  Chronic, she is under increased stress with school and caring for her children - Semaglutide, 1 MG/DOSE, (OZEMPIC, 1 MG/DOSE,) 4 MG/3ML SOPN; Inject 1 mg into the skin once a week.  Dispense: 9 mL; Refill: 1 - Hemoglobin A1c - CMP14+EGFR  2. Essential hypertension  Chronic, good control   Continue with current medications - losartan-hydrochlorothiazide (HYZAAR) 50-12.5 MG tablet; Take 1 tablet by mouth daily.  Dispense: 90 tablet; Refill: 1  3. Rash and nonspecific skin eruption  She has a dry scaly rash to her right hip, will treat with triamcinolone cream likely eczema - triamcinolone (KENALOG) 0.025 % ointment; Apply 1 application topically 2 (two) times daily.  Dispense: 30 g; Refill: 0  4. Encounter for screening for respiratory tuberculosis  Needs done for school - QuantiFERON-TB Gold Plus  5. Mixed hyperlipidemia  Chronic, stable  Tolerating medications well - Lipid panel     Patient was given opportunity to ask questions. Patient verbalized understanding of the plan and was able to repeat key elements of the plan. All questions were answered to their satisfaction.  Minette Brine, FNP   I, Minette Brine, FNP, have reviewed all documentation for this visit. The documentation on 09/11/20 for the exam, diagnosis, procedures, and orders are all accurate and complete.   THE PATIENT IS ENCOURAGED TO PRACTICE SOCIAL DISTANCING DUE TO THE COVID-19 PANDEMIC.

## 2020-08-28 NOTE — Patient Instructions (Signed)
Diabetes Mellitus Basics  Diabetes mellitus, or diabetes, is a long-term (chronic) disease. It occurs when the body does not properly use sugar (glucose) that is released from food after you eat. Diabetes mellitus may be caused by one or both of these problems:  Your pancreas does not make enough of a hormone called insulin.  Your body does not react in a normal way to the insulin that it makes. Insulin lets glucose enter cells in your body. This gives you energy. If you have diabetes, glucose cannot get into cells. This causes high blood glucose (hyperglycemia). How to treat and manage diabetes You may need to take insulin or other diabetes medicines daily to keep your glucose in balance. If you are prescribed insulin, you will learn how to give yourself insulin by injection. You may need to adjust the amount of insulin you take based on the foods that you eat. You will need to check your blood glucose levels using a glucose monitor as told by your health care provider. The readings can help determine if you have low or high blood glucose. Generally, you should have these blood glucose levels:  Before meals (preprandial): 80-130 mg/dL (4.4-7.2 mmol/L).  After meals (postprandial): below 180 mg/dL (10 mmol/L).  Hemoglobin A1c (HbA1c) level: less than 7%. Your health care provider will set treatment goals for you. Keep all follow-up visits. This is important. Follow these instructions at home: Diabetes medicines Take your diabetes medicines every day as told by your health care provider. List your diabetes medicines here:  Name of medicine: ______________________________ ? Amount (dose): _______________ Time (a.m./p.m.): _______________ Notes: ___________________________________  Name of medicine: ______________________________ ? Amount (dose): _______________ Time (a.m./p.m.): _______________ Notes: ___________________________________  Name of medicine:  ______________________________ ? Amount (dose): _______________ Time (a.m./p.m.): _______________ Notes: ___________________________________ Insulin If you use insulin, list the types of insulin you use here:  Insulin type: ______________________________ ? Amount (dose): _______________ Time (a.m./p.m.): _______________Notes: ___________________________________  Insulin type: ______________________________ ? Amount (dose): _______________ Time (a.m./p.m.): _______________ Notes: ___________________________________  Insulin type: ______________________________ ? Amount (dose): _______________ Time (a.m./p.m.): _______________ Notes: ___________________________________  Insulin type: ______________________________ ? Amount (dose): _______________ Time (a.m./p.m.): _______________ Notes: ___________________________________  Insulin type: ______________________________ ? Amount (dose): _______________ Time (a.m./p.m.): _______________ Notes: ___________________________________ Managing blood glucose Check your blood glucose levels using a glucose monitor as told by your health care provider. Write down the times that you check your glucose levels here:  Time: _______________ Notes: ___________________________________  Time: _______________ Notes: ___________________________________  Time: _______________ Notes: ___________________________________  Time: _______________ Notes: ___________________________________  Time: _______________ Notes: ___________________________________  Time: _______________ Notes: ___________________________________   Low blood glucose Low blood glucose (hypoglycemia) is when glucose is at or below 70 mg/dL (3.9 mmol/L). Symptoms may include:  Feeling: ? Hungry. ? Sweaty and clammy. ? Irritable or easily upset. ? Dizzy. ? Sleepy.  Having: ? A fast heartbeat. ? A headache. ? A change in your vision. ? Numbness around the mouth, lips, or  tongue.  Having trouble with: ? Moving (coordination). ? Sleeping. Treating low blood glucose To treat low blood glucose, eat or drink something containing sugar right away. If you can think clearly and swallow safely, follow the 15:15 rule:  Take 15 grams of a fast-acting carb (carbohydrate), as told by your health care provider.  Some fast-acting carbs are: ? Glucose tablets: take 3-4 tablets. ? Hard candy: eat 3-5 pieces. ? Fruit juice: drink 4 oz (120 mL). ? Regular (not diet) soda: drink 4-6 oz (120-180 mL). ? Honey or sugar:   eat 1 Tbsp (15 mL).  Check your blood glucose levels 15 minutes after you take the carb.  If your glucose is still at or below 70 mg/dL (3.9 mmol/L), take 15 grams of a carb again.  If your glucose does not go above 70 mg/dL (3.9 mmol/L) after 3 tries, get help right away.  After your glucose goes back to normal, eat a meal or a snack within 1 hour. Treating very low blood glucose If your glucose is at or below 54 mg/dL (3 mmol/L), you have very low blood glucose (severe hypoglycemia). This is an emergency. Do not wait to see if the symptoms will go away. Get medical help right away. Call your local emergency services (911 in the [place] Do not drive yourself to the hospital. Questions to ask your health care provider  Should I talk with a diabetes educator?  What equipment will I need to care for myself at home?  What diabetes medicines do I need? When should I take them?  How often do I need to check my blood glucose levels?  What number can I call if I have questions?  When is my follow-up visit?  Where can I find a support group for people with diabetes? Where to find more information  American Diabetes Association: www.diabetes.org  Association of Diabetes Care and Education Specialists: www.diabeteseducator.org Contact a health care provider if:  Your blood glucose is at or above 240 mg/dL (13.3 mmol/L) for 2 days in a row.  You have  been sick or have had a fever for 2 days or more, and you are not getting better.  You have any of these problems for more than 6 hours: ? You cannot eat or drink. ? You feel nauseous. ? You vomit. ? You have diarrhea. Get help right away if:  Your blood glucose is lower than 54 mg/dL (3 mmol/L).  You get confused.  You have trouble thinking clearly.  You have trouble breathing. These symptoms may represent a serious problem that is an emergency. Do not wait to see if the symptoms will go away. Get medical help right away. Call your local emergency services (911 in the [place] Do not drive yourself to the hospital. Summary  Diabetes mellitus is a chronic disease that occurs when the body does not properly use sugar (glucose) that is released from food after you eat.  Take insulin and diabetes medicines as told.  Check your blood glucose every day, as often as told.  Keep all follow-up visits. This is important. This information is not intended to replace advice given to you by your health care provider. Make sure you discuss any questions you have with your health care provider. Document Revised: 11/22/2019 Document Reviewed: 11/22/2019 Elsevier Patient Education  2021 [employer] Inc[employer]  

## 2020-08-29 ENCOUNTER — Telehealth: Payer: Self-pay

## 2020-08-29 NOTE — Progress Notes (Signed)
Subjective: 40 year old female presents the office today for follow-up evaluation of a painful callus to her big toe joint, medial aspect.  She states that the area still tender I think it is thick.  After it gets debrided it feels better.  Denies any open sores or any swelling or redness. Denies any systemic complaints such as fevers, chills, nausea, vomiting. No acute changes since last appointment, and no other complaints at this time.   Objective: AAO x3, NAD DP/PT pulses palpable bilaterally, CRT less than 3 seconds Hyperkeratotic lesion medial hallux and there is some dried blood present however upon debridement there is no underlying ulceration drainage or any signs of infection noted today.  No open lesions identified today. No pain with calf compression, swelling, warmth, erythema  Assessment: Preulcerative skin lesion  Plan: -All treatment options discussed with the patient including all alternatives, risks, complications.  -Debrided hyperkeratotic lesion with any complications or bleeding.  Discussed surgical intervention to help eliminate the pressure however given her uncontrolled diabetes we will hold off on this for now.  Continue offloading discussed shoe modifications.     Trula Slade DPM

## 2020-08-29 NOTE — Progress Notes (Signed)
08/29/20- Notified the patient of upcoming CCM Call appointment on 08/30/20 at 2:30 pm with Orlando Penner, CPP. Patient aware to have medications and supplements near during phone visit. Patient verbalized understanding.  Orlando Penner, CPP notified.  Raynelle Highland, [place] Pharmacist Assistant [phone]

## 2020-08-30 ENCOUNTER — Ambulatory Visit: Payer: Medicaid Other

## 2020-08-30 DIAGNOSIS — J45909 Unspecified asthma, uncomplicated: Secondary | ICD-10-CM

## 2020-08-30 DIAGNOSIS — F32A Depression, unspecified: Secondary | ICD-10-CM

## 2020-08-30 DIAGNOSIS — I10 Essential (primary) hypertension: Secondary | ICD-10-CM

## 2020-08-30 DIAGNOSIS — E782 Mixed hyperlipidemia: Secondary | ICD-10-CM

## 2020-08-30 DIAGNOSIS — D649 Anemia, unspecified: Secondary | ICD-10-CM

## 2020-08-30 DIAGNOSIS — E1165 Type 2 diabetes mellitus with hyperglycemia: Secondary | ICD-10-CM

## 2020-08-30 NOTE — Chronic Care Management (AMB) (Signed)
Chronic Care Management Pharmacy  Name: [name]  MRN: [id number] DOB: October 08, 1980   Chief Complaint/ HPI  [name],  40 y.o. , female presents for their Initial CCM visit with the clinical pharmacist via telephone due to COVID-19 Pandemic. She has twin boy and girl and an older son who are 8 and 9. 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 She is excited about graduating in April. She is working hard to achieve her goals and she has been stripped of her personal life while she has been in school. She is going to graduate as nurse, and she is learning a lot and preparing for the NCLEX. She has to have an A or an B at least a 79.5. She has a lot of pressure but she knows it is worth it. She has a cousin who sends her information and encouragement to hang on. She reports that [place] encouraged her to work hard, if someone has gone to school so can you. She feels like a lot of her health issues have happened so that she can get through school.  She has two children who have ADHD but two of them on honor roll. Her god mother helps her with the kids.   She is concerned about her weight and the stress of the school. She was laid off from a [employer] and was laid off, she went to [employer] school and graduated in October 2020 and started nursing school in January 2021. She has a goal to be under 300 pounds.  Patient reports that sometimes she has to decide between gas and medication. She quit smoking a year ago.   PCP : Minette Brine, FNP  Their chronic conditions include: Essential Hypertension, Asthma, Type 2 Diabetes, Depression  Office Visits:   08/28/2020 OV:   06/08/2020 Phone Conversation: Patients Contrave not covered by insurance, will cost $99  05/08/2020 OV: I will be taking her off the wellbutrin because she is starting contrave. 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 albuterol (PROVENTIL) (5 MG/ML) 0.5% nebulizer solution Take 0.5 mLs (2.5 mg total) by nebulization every 6 (six) hours as needed for wheezing or shortness of breath.  [name] albuterol (VENTOLIN HFA) 108 (90 Base) MCG/ACT inhaler Inhale 1-2 puffs into the lungs every 6 (six) hours as needed for wheezing or shortness of breath.  [name] atorvastatin (LIPITOR) 10 MG tablet TAKE 1 TABLET(10 MG) BY MOUTH AT BEDTIME  . blood glucose meter kit and supplies KIT Dispense based on patient and insurance preference. Use up to four times daily as directed. (FOR ICD-9 250.00, 250.01).  [name] buPROPion (WELLBUTRIN XL) 300 MG 24 hr tablet Take 1 tablet (300 mg total) by mouth at bedtime. Take 1 tab by mouth every other day x 2 weeks then take 1 tab by mouth every second day x 2 weeks then discontinue  . cetirizine (ZYRTEC ALLERGY) 10 MG tablet Take 1 tablet (10 mg total) by mouth daily.  . dapagliflozin propanediol (FARXIGA) 5 MG TABS tablet Take 1 tablet (5 mg total) by mouth daily before breakfast.  . diclofenac (VOLTAREN) 50 MG EC tablet Take 1 tablet (50 mg total) by mouth 3 (three) times daily.  . ferrous sulfate 325 (65 FE) MG EC tablet Take 1 tablet (325  mg total) by mouth 3 (three) times daily with meals.  . fluticasone (FLONASE) 50 MCG/ACT nasal spray Place 1-2 sprays into both nostrils daily.  [name] glucose blood test strip USE TO CHECK BLOOD SUGARS UP TO 4 TIMES DAILY  DX CODE:E11.9  . ibuprofen (ADVIL) 800 MG tablet Take 800 mg by mouth 3 (three) times daily.  . insulin degludec (TRESIBA FLEXTOUCH) 100 UNIT/ML FlexTouch Pen Inject 0.35 mLs (35 Units total) into the skin daily.  . Lancets (ACCU-CHEK MULTICLIX) lancets USE TO CHECK BLOOD SUGARS UP TO 4 TIMES DAILY  DX CODE:E11.9  . levonorgestrel (MIRENA) 20 MCG/24HR IUD 1 each by Intrauterine route once.  [name] losartan-hydrochlorothiazide (HYZAAR) 50-12.5  MG tablet Take 1 tablet by mouth daily.  . metFORMIN (GLUCOPHAGE-XR) 750 MG 24 hr tablet TAKE 1 TABLET(750 MG) BY MOUTH IN THE MORNING AND AT BEDTIME  . Naltrexone-buPROPion HCl ER 8-90 MG TB12 Take 1 tablet by mouth daily for 1 week then increase to 1 tablet by mouth twice a day x 1 week then increase to 1 tab by mouth am and 2 tabs by mouth pm x 1 week then 2 tabs by mouth twice a day  . propranolol (INDERAL) 10 MG tablet Take 1 tablet (10 mg total) by mouth every evening.  . Semaglutide, 1 MG/DOSE, (OZEMPIC, 1 MG/DOSE,) 4 MG/3ML SOPN Inject 1 mg into the skin once a week.  . triamcinolone (KENALOG) 0.025 % ointment Apply 1 application topically 2 (two) times daily.   No facility-administered encounter medications on file as of 08/30/2020.     Current Diagnosis/Assessment:  Goals Addressed            This Visit's Progress   . Pharmacy Care Plan       CARE PLAN ENTRY (see longitudinal plan of care for additional care plan information)  Current Barriers:  . Chronic Disease Management support, education, and care coordination needs related to Hypertension, Hyperlipidemia, and Diabetes   Hypertension BP Readings from Last 3 Encounters:  08/28/20 128/80  07/23/20 (!) 147/100  05/08/20 138/80   . Pharmacist Clinical Goal(s): o Over the next 90 days, patient will work with PharmD and providers to maintain BP goal <130/80 . Current regimen:  o Losartan - HCTZ 50-12.5 mg take daily . Interventions: o Discussed the importance of checking BP at home o Eating a low salt diet . Patient self care activities - Over the next 90 days, patient will: o Check BP 1-2 times per week, document, and provide at future appointments o Ensure daily salt intake < 2300 mg/day  Hyperlipidemia Lab Results  Component Value Date/Time   LDLCALC 113 (H) 08/28/2020 05:35 PM   . Pharmacist Clinical Goal(s): o Over the next 90 days, patient will work with PharmD and providers to achieve LDL goal <  70 . Current regimen:  o Atorvastatin 10 mg tablet daily  . Interventions:  Patient reports that she takes it everyday   Encouraged patient to keep with goal of losing 15 lbs   Increase the amount of lean meats and vegetables that she is eating   Incorporating more fresh fruit and vegetables   In particular berries  . Patient self care activities - Over the next 90 days, patient will: o Take Atorvastatin 10 mg tablet once a day  o Increase the amount of vegetables that she eats with lunch   Diabetes Lab Results  Component Value Date/Time   HGBA1C 11.1 (H) 08/28/2020 05:35 PM   HGBA1C 10.1 (H) 05/08/2020 05:18  PM   . Pharmacist Clinical Goal(s): o Over the next 90 days, patient will work with PharmD and providers to achieve A1c goal <7% . Current regimen:  . Tresiba - 35 units prior to bed . Metformin XR 750 mg - taking 1 tablet by mouth in the morning and at bedtime  . Ozempic 49m/dose - inject 172monce a week on Sunday . Farxiga 5 mg take daily  . Interventions: o The importance of carb counting o Limiting the amount of sugary beverages she drinks o Cooking more often at home  o Decreasing the amount snacks eaten for breakfast  . Patient self care activities - Over the next 90 days, patient will: o Check blood sugar twice daily, document, and provide at future appointments o Contact provider with any episodes of hypoglycemia   Medication management . Pharmacist Clinical Goal(s): o Over the next 90 days, patient will work with PharmD and providers to achieve optimal medication adherence . Current pharmacy: [facility]. Interventions o Comprehensive medication review performed. o Continue current medication management strategy . Patient self care activities - Over the next 90 days, patient will: o Focus on medication adherence by taking her medication at the same time eachday.  o Take medications as prescribed o Report any questions or concerns to PharmD  and/or provider(s)  Initial goal documentation       Social Determinants of Health with Concerns   Tobacco Use: Medium Risk  . Smoking Tobacco Use: Former Smoker  . Smokeless Tobacco Use: Never Used  FiEmergency planning/management officertrain: High Risk  . Difficulty of Paying Living Expenses: Hard  Food Insecurity: Not on file  Transportation Needs: Not on file  Physical Activity: Not on file  Stress: Not on file  Social Connections: Not on file  Intimate Partner Violence: Not on file  Depression (PHQ2-9): Medium Risk  . PHQ-2 Score: 11  Alcohol Screen: Not on file  Housing: Not on file     Hypertension   BP today <130/80    Office blood pressures are  BP Readings from Last 3 Encounters:  08/28/20 128/80  07/23/20 (!) 147/100  05/08/20 138/80    Patient has failed these meds in the past: none noted   Currently taking:  Losartan - HCTZ 50-12.5 mg take daily  Patient checks BP at home infrequently  Patient home BP readings are ranging: none were discussed   We discussed:  The importance of checking her BP at home  Eat a low salt diet, decreasing the amount of fried fatty foods that she is eating  Plan  Continue current medications     Diabetes   A1c goal <7%  Recent Relevant Labs: Lab Results  Component Value Date/Time   HGBA1C 10.1 (H) 05/08/2020 05:18 PM   HGBA1C 11.6 (H) 02/29/2020 05:43 PM   MICROALBUR 30 05/08/2020 05:38 PM   MICROALBUR 30 03/29/2019 04:41 PM    Last diabetic Eye exam:  Lab Results  Component Value Date/Time   HMDIABEYEEXA No Retinopathy 08/25/2019 12:00 AM    Last diabetic Foot exam: No results found for: HMDIABFOOTEX  -Last foot exam on 06/25/2020 with Dr. NoIla Mcgill Checking BG: 2x per Day  Recent FBG Readings: <200, patient is no longer checking BS    Patient has failed these meds in past:  Patient is currently uncontrolled on the following medications: . TrTyler Aas 35 units prior to bed . Metformin XR 750 mg - taking  1 tablet by mouth in the  morning and at bedtime  . Ozempic 51m/dose - inject 131monce a week on Sunday . Farxiga 5 mg take daily   We discussed:     Pt to pick up her meter from [facility] today because it is ready   Patient reports that sometimes it helps her to stop eating for one day a week  We discussed diet and exercise extensively:  Breakfast: She keeps fruit with her in the morning, with some chips, or pringles to get her going, because she does not have time to have breakfast  Lunch: she goes to [employer] purchases fried chicken  She is going to replace one lunch a week with a salad   Dinner: she makes it colorful, red meat every once in a while  She eats more healthy with the kids.   Snacks   She drinks a lot of water, she get the [place] and can drink three of those per day   Discussed the importance of drinking non sugar beverages   Possible to buy Zevia on [employer] for lower cost   Pt. Reports that is available on EBT for her to use  Goal to lose 15 pounds in the next month   Plan  Continue current medications  Will collaborate with PCP team to maximize patients current Metformin dosing  Hyperlipidemia   LDL goal < 70   Last lipids Lab Results  Component Value Date   CHOL 148 05/08/2020   HDL 44 05/08/2020   LDLCALC 85 05/08/2020   TRIG 105 05/08/2020   CHOLHDL 3.4 05/08/2020   Hepatic Function Latest Ref Rng & Units 05/08/2020 02/29/2020 11/30/2019  Total Protein 6.0 - 8.5 g/dL 6.6 7.2 7.3  Albumin 3.8 - 4.8 g/dL 4.0 4.2 4.3  AST 0 - 40 IU/L 19 71(H) 14  ALT 0 - 32 IU/L 39(H) 85(H) 19  Alk Phosphatase 44 - 121 IU/L 61 71 92  Total Bilirubin 0.0 - 1.2 mg/dL 0.4 0.4 0.5     The ASCVD Risk score ([place] et al., 2013) failed to calculate for the following reasons:   The 2013 ASCVD risk score is only valid for ages 4030o 7932 Patient has failed these meds in past: none Patient is currently uncontrolled on the following  medications:  . Atorvastatin 10 mg tablet daily   We discussed:    Patient reports that she takes it everyday   Encouraged patient to keep with goal of losing 15 lbs   Incorporating more fresh fruit and vegetables   In particular berries   Recommended that patient start a Coq-10  Medicaid prescription   Plan  Continue current medications for now, patients medication to be increased to Atorvastatin 20 mg tablet daily.    ASTHMA     No flowsheet data found. Lab Results  Component Value Date/Time   EOSPCT 3 01/05/2017 09:12 AM   EOSABS 0.3 05/12/2019 02:48 PM    Patient has failed these meds in past:  Patient is currently controlled on the following medications:  . Albuterol Sulfate : inhale 1-2 puffs as needed every 6 hours for wheezing or shortness of breath   Using maintenance inhaler regularly? No Frequency of rescue inhaler use:  infrequently  We discussed  :   -Pt had a flare up last week when it went from cold to hot and she had to use the Albuterol inhaler.  -Only has issues with asthma once every 6 months   Plan  Continue current medications   Depression   Depression screen [facility] 2/9 02/29/2020 11/30/2019 08/24/2019  Decreased Interest 0 0 0  Down, Depressed, Hopeless 1 0 0  PHQ - 2 Score 1 0 0  Altered sleeping 1 3 -  Tired, decreased energy 3 3 -  Change in appetite - 3 -  Feeling bad or failure about yourself  0 0 -  Trouble concentrating 0 3 -  Moving slowly or fidgety/restless 0 0 -  Suicidal thoughts 0 0 -  PHQ-9 Score 5 12 -  Difficult doing work/chores Not difficult at all Somewhat difficult -    Patient has failed these meds in past: none noted  Patient is currently controlled on the following medications:  . Wellbutrin XL 300 mg- take daily   We discussed:    Patient reports that she is stressed about school but doing well  Reports taking her medication every day   Currently working on her nursing degree  She is excited about  graduation    Plan  Continue current medications      Allergies    Patient has failed these meds in past: Patient is currently controlled on the following medications:  . Flonase 50 mcg/ACT : place 1-2 sprays into both nostrils daily . Cetirizine 10 mg- take daily   Will discuss during next office visit   Plan  Continue current medications  Anemia   CBC Latest Ref Rng & Units 05/12/2019 03/29/2019 12/22/2018  WBC 3.4 - 10.8 x10E3/uL 8.4 9.8 9.3  Hemoglobin 11.1 - 15.9 g/dL 11.7 11.5 10.5(L)  Hematocrit 34.0 - 46.6 % 37.2 36.3 34.9  Platelets 150 - 450 x10E3/uL 393 402 411   Iron/TIBC/Ferritin/ %Sat    Component Value Date/Time   IRON 40 03/29/2019 1621   TIBC 477 (H) 03/29/2019 1621   FERRITIN 8 (L) 03/29/2019 1621   IRONPCTSAT 8 (LL) 03/29/2019 1621    Patient has failed these meds in past: Patient is currently uncontrolled on the following medications:  . Ferrous Sulfate 65 Fe - take three times per day   We discussed:    Patient reports that she does not take her medication three times per day   Patient reports that she does not always take her medication   Plan  Continue current medications   ANXIETY   Patient has failed these meds in past: none noted  Patient is currently controlled on the following medications:  . Propranolol 10 mg take daily . Wellbutrin XL 300 mg- take daily at bedtime  We discussed:    Pt takes medication everyday  She is not anxious  Excited about being so close to graduation in April   Plan  Continue current medications   Weight Loss    Patient has failed these meds in past:   Patient is currently uncontrolled on the following medications:  . Wellbutrin XL 300 mg- taking 1 tablet by mouth daily   Will discuss during next office visit   Plan  Continue current medications   Vaccines   Reviewed and discussed patient's vaccination history.    Immunization History  Administered Date(s) Administered  .  Influenza,inj,Quad PF,6+ Mos 05/12/2019  . Moderna Sars-Covid-2 Vaccination 12/08/2019, 01/10/2020  . Tdap 03/28/2011    Plan  Recommended patient receive COVID-19 booster  vaccine in  office.    Medication Management   Patient's preferred pharmacy is:  [id number] - [place], [place]  [address] [place] [address] Phone: [phone] Fax: [phone]  [place], [place] [address] Phone: [phone] Fax: [phone]  Uses pill box? No - not at this time Pt endorses 88% compliance  We discussed: Discussed benefits of medication synchronization, packaging and delivery as well as enhanced pharmacist oversight with [employer].  Plan  Continue current medication management strategy    Follow up: 1 month phone visit  Orlando Penner, PharmD Clinical Pharmacist [facility] [phone]

## 2020-08-31 LAB — CMP14+EGFR
ALT: 28 IU/L (ref 0–32)
AST: 17 IU/L (ref 0–40)
Albumin/Globulin Ratio: 1.4 (ref 1.2–2.2)
Albumin: 3.8 g/dL (ref 3.8–4.8)
Alkaline Phosphatase: 64 IU/L (ref 44–121)
BUN/Creatinine Ratio: 10 (ref 9–23)
BUN: 8 mg/dL (ref 6–20)
Bilirubin Total: 0.4 mg/dL (ref 0.0–1.2)
CO2: 23 mmol/L (ref 20–29)
Calcium: 9.3 mg/dL (ref 8.7–10.2)
Chloride: 100 mmol/L (ref 96–106)
Creatinine, Ser: 0.8 mg/dL (ref 0.57–1.00)
GFR calc Af Amer: 108 mL/min/{1.73_m2} (ref >59)
GFR calc non Af Amer: 94 mL/min/{1.73_m2} (ref >59)
Globulin, Total: 2.8 g/dL (ref 1.5–4.5)
Glucose: 218 mg/dL — ABNORMAL HIGH (ref 65–99)
Potassium: 4 mmol/L (ref 3.5–5.2)
Sodium: 138 mmol/L (ref 134–144)
Total Protein: 6.6 g/dL (ref 6.0–8.5)

## 2020-08-31 LAB — QUANTIFERON-TB GOLD PLUS
QuantiFERON Mitogen Value: >10 IU/mL
QuantiFERON Nil Value: 0.02 IU/mL
QuantiFERON TB1 Ag Value: 0 IU/mL
QuantiFERON TB2 Ag Value: 0 IU/mL
QuantiFERON-TB Gold Plus: NEGATIVE

## 2020-08-31 LAB — LIPID PANEL
Chol/HDL Ratio: 5.4 ratio — ABNORMAL HIGH (ref 0.0–4.4)
Cholesterol, Total: 182 mg/dL (ref 100–199)
HDL: 34 mg/dL — ABNORMAL LOW (ref >39)
LDL Chol Calc (NIH): 113 mg/dL — ABNORMAL HIGH (ref 0–99)
Triglycerides: 197 mg/dL — ABNORMAL HIGH (ref 0–149)
VLDL Cholesterol Cal: 35 mg/dL (ref 5–40)

## 2020-08-31 LAB — HEMOGLOBIN A1C
Est. average glucose Bld gHb Est-mCnc: 272 mg/dL
Hgb A1c MFr Bld: 11.1 % — ABNORMAL HIGH (ref 4.8–5.6)

## 2020-09-03 ENCOUNTER — Other Ambulatory Visit: Payer: Self-pay | Admitting: Nurse Practitioner

## 2020-09-03 DIAGNOSIS — E1165 Type 2 diabetes mellitus with hyperglycemia: Secondary | ICD-10-CM

## 2020-09-03 MED ORDER — ATORVASTATIN CALCIUM 20 MG PO TABS: 20.0000 mg | ORAL_TABLET | Freq: Every day | ORAL | 1 refills | Status: DC

## 2020-09-03 NOTE — Patient Instructions (Signed)
Visit Information  Goals Addressed            This Visit's Progress   . Pharmacy Care Plan       CARE PLAN ENTRY (see longitudinal plan of care for additional care plan information)  Current Barriers:  . Chronic Disease Management support, education, and care coordination needs related to Hypertension, Hyperlipidemia, and Diabetes   Hypertension BP Readings from Last 3 Encounters:  08/28/20 128/80  07/23/20 (!) 147/100  05/08/20 138/80   . Pharmacist Clinical Goal(s): o Over the next 90 days, patient will work with PharmD and providers to maintain BP goal <130/80 . Current regimen:  o Losartan - HCTZ 50-12.5 mg take daily . Interventions: o Discussed the importance of checking BP at home o Eating a low salt diet . Patient self care activities - Over the next 90 days, patient will: o Check BP 1-2 times per week, document, and provide at future appointments o Ensure daily salt intake < 2300 mg/day  Hyperlipidemia Lab Results  Component Value Date/Time   LDLCALC 113 (H) 08/28/2020 05:35 PM   . Pharmacist Clinical Goal(s): o Over the next 90 days, patient will work with PharmD and providers to achieve LDL goal < 70 . Current regimen:  o Atorvastatin 10 mg tablet daily  . Interventions:  Patient reports that she takes it everyday   Encouraged patient to keep with goal of losing 15 lbs   Increase the amount of lean meats and vegetables that she is eating   Incorporating more fresh fruit and vegetables   In particular berries  . Patient self care activities - Over the next 90 days, patient will: o Take Atorvastatin 10 mg tablet once a day  o Increase the amount of vegetables that she eats with lunch   Diabetes Lab Results  Component Value Date/Time   HGBA1C 11.1 (H) 08/28/2020 05:35 PM   HGBA1C 10.1 (H) 05/08/2020 05:18 PM   . Pharmacist Clinical Goal(s): o Over the next 90 days, patient will work with PharmD and providers to achieve A1c goal <7% . Current  regimen:  . Tresiba - 35 units prior to bed . Metformin XR 750 mg - taking 1 tablet by mouth in the morning and at bedtime  . Ozempic 1mg /dose - inject 1mg  once a week on Sunday . Farxiga 5 mg take daily  . Interventions: o The importance of carb counting o Limiting the amount of sugary beverages she drinks o Cooking more often at home  o Decreasing the amount snacks eaten for breakfast  . Patient self care activities - Over the next 90 days, patient will: o Check blood sugar twice daily, document, and provide at future appointments o Contact provider with any episodes of hypoglycemia   Medication management . Pharmacist Clinical Goal(s): o Over the next 90 days, patient will work with PharmD and providers to achieve optimal medication adherence . Current pharmacy: [facility]  . Interventions o Comprehensive medication review performed. o Continue current medication management strategy . Patient self care activities - Over the next 90 days, patient will: o Focus on medication adherence by taking her medication at the same time eachday.  o Take medications as prescribed o Report any questions or concerns to PharmD and/or provider(s)  Initial goal documentation        Ms. Besse was given information about Chronic Care Management services today including:  1. CCM service includes personalized support from designated clinical staff supervised by her physician, including individualized plan of  care and coordination with other care providers 2. 24/7 contact phone numbers for assistance for urgent and routine care needs. 3. Standard insurance, coinsurance, copays and deductibles apply for chronic care management only during months in which we provide at least 20 minutes of these services. Most insurances cover these services at 100%, however patients may be responsible for any copay, coinsurance and/or deductible if applicable. This service may help you avoid the need for more  expensive face-to-face services. 4. Only one practitioner may furnish and bill the service in a calendar month. 5. The patient may stop CCM services at any time (effective at the end of the month) by phone call to the office staff.  Patient agreed to services and verbal consent obtained.   The patient verbalized understanding of instructions, educational materials, and care plan provided today and declined offer to receive copy of patient instructions, educational materials, and care plan.  The pharmacy team will reach out to the patient again over the next 15 days.   Mayford Knife, [facility] ([place])

## 2020-09-17 ENCOUNTER — Telehealth: Payer: Medicaid Other

## 2020-09-17 ENCOUNTER — Ambulatory Visit: Payer: Self-pay

## 2020-09-17 DIAGNOSIS — I10 Essential (primary) hypertension: Secondary | ICD-10-CM

## 2020-09-17 DIAGNOSIS — Z6841 Body Mass Index (BMI) 40.0 and over, adult: Secondary | ICD-10-CM

## 2020-09-17 DIAGNOSIS — E1165 Type 2 diabetes mellitus with hyperglycemia: Secondary | ICD-10-CM

## 2020-09-17 DIAGNOSIS — E782 Mixed hyperlipidemia: Secondary | ICD-10-CM

## 2020-09-17 DIAGNOSIS — F419 Anxiety disorder, unspecified: Secondary | ICD-10-CM

## 2020-09-18 NOTE — Chronic Care Management (AMB) (Signed)
Care Management    RN Visit Note  09/17/2020 Name: [name] MRN: [id number] DOB: Dec 10, 1980  Subjective: [name] is a 40 y.o. year old female who is a primary care patient of [name], [name]. The care management team was consulted for assistance with disease management and care coordination needs.    Engaged with patient by telephone for follow up visit in response to provider referral for case management and/or care coordination services.   Consent to Services:   [name] was given information about Care Management services today including:  1. Care Management services includes personalized support from designated clinical staff supervised by her physician, including individualized plan of care and coordination with other care providers 2. 24/7 contact phone numbers for assistance for urgent and routine care needs. 3. The patient may stop case management services at any time by phone call to the office staff.  Patient agreed to services and consent obtained.   Assessment: Review of patient past medical history, allergies, medications, health status, including review of consultants reports, laboratory and other test data, was performed as part of comprehensive evaluation and provision of chronic care management services.   SDOH (Social Determinants of Health) assessments and interventions performed:  No  Care Plan  Allergies  Allergen Reactions  . Other Anaphylaxis and Other (See Comments)    Pt has fish allergy Pt has allergy to butter cookies  . Penicillins Rash and Other (See Comments)    Told MD that PCN reaction is rash and that she has taken amoxicillin before  . Bactrim [Sulfamethoxazole-Trimethoprim]     Diarrhea, abdominal cramps and rash    Outpatient Encounter Medications as of 09/17/2020  Medication Sig Note  . albuterol (PROVENTIL) (5 MG/ML) 0.5% nebulizer solution Take 0.5 mLs (2.5 mg total) by nebulization every 6 (six) hours as needed for  wheezing or shortness of breath.   [name] albuterol (VENTOLIN HFA) 108 (90 Base) MCG/ACT inhaler Inhale 1-2 puffs into the lungs every 6 (six) hours as needed for wheezing or shortness of breath.   [name] atorvastatin (LIPITOR) 20 MG tablet Take 1 tablet (20 mg total) by mouth daily.   . blood glucose meter kit and supplies KIT Dispense based on patient and insurance preference. Use up to four times daily as directed. (FOR ICD-9 250.00, 250.01).   [name] buPROPion (WELLBUTRIN XL) 300 MG 24 hr tablet Take 1 tablet (300 mg total) by mouth at bedtime. Take 1 tab by mouth every other day x 2 weeks then take 1 tab by mouth every second day x 2 weeks then discontinue   . cetirizine (ZYRTEC ALLERGY) 10 MG tablet Take 1 tablet (10 mg total) by mouth daily.   . dapagliflozin propanediol (FARXIGA) 5 MG TABS tablet Take 1 tablet (5 mg total) by mouth daily before breakfast.   . diclofenac (VOLTAREN) 50 MG EC tablet Take 1 tablet (50 mg total) by mouth 3 (three) times daily. (Patient not taking: Reported on 08/30/2020)   . ferrous sulfate 325 (65 FE) MG EC tablet Take 1 tablet (325 mg total) by mouth 3 (three) times daily with meals.   . fluticasone (FLONASE) 50 MCG/ACT nasal spray Place 1-2 sprays into both nostrils daily.   [name] glucose blood test strip USE TO CHECK BLOOD SUGARS UP TO 4 TIMES DAILY  DX CODE:E11.9   . ibuprofen (ADVIL) 800 MG tablet Take 800 mg by mouth 3 (three) times daily. (Patient not taking: Reported on 08/30/2020) 08/30/2020: Was taking this medicine  because she needed her tooth pulled   . insulin degludec (TRESIBA FLEXTOUCH) 100 UNIT/ML FlexTouch Pen Inject 0.35 mLs (35 Units total) into the skin daily.   . Lancets (ACCU-CHEK MULTICLIX) lancets USE TO CHECK BLOOD SUGARS UP TO 4 TIMES DAILY  DX CODE:E11.9   . levonorgestrel (MIRENA) 20 MCG/24HR IUD 1 each by Intrauterine route once.   [name] losartan-hydrochlorothiazide (HYZAAR) 50-12.5 MG tablet Take 1 tablet by mouth daily.   . metFORMIN (GLUCOPHAGE-XR) 750 MG  24 hr tablet TAKE 1 TABLET(750 MG) BY MOUTH IN THE MORNING AND AT BEDTIME   . Naltrexone-buPROPion HCl ER 8-90 MG TB12 Take 1 tablet by mouth daily for 1 week then increase to 1 tablet by mouth twice a day x 1 week then increase to 1 tab by mouth am and 2 tabs by mouth pm x 1 week then 2 tabs by mouth twice a day (Patient not taking: Reported on 08/30/2020)   . propranolol (INDERAL) 10 MG tablet Take 1 tablet (10 mg total) by mouth every evening.   . Semaglutide, 1 MG/DOSE, (OZEMPIC, 1 MG/DOSE,) 4 MG/3ML SOPN Inject 1 mg into the skin once a week.   . triamcinolone (KENALOG) 0.025 % ointment Apply 1 application topically 2 (two) times daily.    No facility-administered encounter medications on file as of 09/17/2020.    Patient Active Problem List   Diagnosis Date Noted  . Class 3 severe obesity due to excess calories with serious comorbidity and body mass index (BMI) of 50.0 to 59.9 in adult ([place]) 05/19/2019  . Anemia 12/21/2018  . Uncontrolled type 2 diabetes mellitus with hyperglycemia ([place]) 12/21/2018  . Anxiety 07/19/2018  . Depression 07/19/2018  . Essential hypertension 07/19/2018  . Fibroid uterus 09/17/2015  . Menorrhagia 08/27/2015  . Type 2 diabetes mellitus without complication, with long-term current use of insulin ([place]) 09/13/2012  . BMI 60.0-69.9, adult ([place]) 09/13/2012  . Asthma 09/13/2012  . History of high blood pressure 09/13/2012  . OSA (obstructive sleep apnea) 12/10/2010    Conditions to be addressed/monitored: HTN, HLD, Anxiety and Class 3 Obesity   Care Plan : Diabetes Type 2 (Adult)  Updates made by Stephanie Logan, RN since 09/18/2020 12:00 AM    Problem: Glycemic Management (Diabetes, Type 2)   Priority: High    Long-Range Goal: Glycemic Management Optimized   Start Date: 08/06/2020  Expected End Date: 11/05/2020  Recent Progress: On track  Priority: High  Note:   Objective:  Lab Results  Component Value Date   HGBA1C 10.1 (H) 05/08/2020 .   Lab Results   Component Value Date   CREATININE 0.79 05/08/2020   CREATININE 0.80 02/29/2020   CREATININE 0.91 11/30/2019 .   [name] No results found for: EGFR Current Barriers:  [name] Knowledge Deficits related to basic Diabetes pathophysiology and self care/management . Knowledge Deficits related to medications used for management of diabetes Case Manager Clinical Goal(s):  [name] Over the next 180 days, patient will demonstrate improved adherence to prescribed treatment plan for diabetes self care/management as evidenced by:  . daily monitoring and recording of CBG  . adherence to ADA/ carb modified diet . exercise 3-5 days/week . adherence to prescribed medication regimen Interventions:  09/17/20 successful call completed with patient  . Provided education to patient about basic DM disease process  . Review of patient status, including review of consultants reports, relevant laboratory and other test results, and medications completed; discussed current A1c is elevated to 11.1 . Assessed for barriers to prevent  patient from adhering to prescribed DM treatment plan . Determined patient continues to be enrolled in [employer] and is completing her last semester, adding increased stress to her life . Confirmed patient picked up her glucometer and is starting to check her blood sugars several times per week . Educated on recommendations to monitor FBS daily before meals and at bedtime, before and after exercise and or if she feels her BS is too high or too low . Reviewed medications with patient and discussed importance of medication adherence . Confirmed patient received and reviewed the written educational materials related to hypo and hyperglycemia and importance of correct treatment . Discussed plans with patient for ongoing care management follow up and provided patient with direct contact information for care management team Patient Goals/Self-Care Activities . Over the next 180 days, patient will:  - take  prescribed medications as directed  - check FBS daily before meals and at bedtime  - adhere to exercise recommendations as discussed  - follow up with Dr. Kelton Pillar Endocrinologist, scheduled for 11/02/20 _0 :20 PM  - set target A1c  Follow Up Plan: Telephone follow up appointment with care management team member scheduled for: 11/28/20     Plan: Telephone follow up appointment with care management team member scheduled for:  11/28/20  Barb Merino, RN, BSN, CCM Care Management Coordinator [facility]  Direct Phone: [phone]

## 2020-09-18 NOTE — Patient Instructions (Signed)
Goals Addressed    . Monitor and Manage My Blood Sugar-Diabetes Type 2   On track    Timeframe:  Long-Range Goal Priority:  High Start Date:  08/06/20                          Expected End Date:  02/03/21                 Follow Up Date: 11/28/20  - take prescribed medications as directed  - check FBS daily before meals and at bedtime  - adhere to dietary and exercise recommendations as discussed  - follow up with Dr. Kelton Pillar Endocrinologist, scheduled for 11/02/20 @3 :20 PM  - set target A1c    Why is this important?    Checking your blood sugar at home helps to keep it from getting very high or very low.   Writing the results in a diary or log helps the doctor know how to care for you.   Your blood sugar log should have the time, date and the results.   Also, write down the amount of insulin or other medicine that you take.   Other information, like what you ate, exercise done and how you were feeling, will also be helpful.     Notes:     . Set My Target A1C-Diabetes Type 2   On track    Timeframe:  Long-Range Goal Priority:  High Start Date: 08/06/20                            Expected End Date: 02/03/21                      Follow Up Date:  11/29/20   - adhere to dietary and exercise recommendations - take prescribed medications as directed - set target A1c   Why is this important?    Your target A1C is decided together by you and your doctor.   It is based on several things like your age and other health issues.    Notes:

## 2020-09-27 ENCOUNTER — Ambulatory Visit (INDEPENDENT_AMBULATORY_CARE_PROVIDER_SITE_OTHER): Payer: Medicaid Other | Admitting: Podiatry

## 2020-09-27 ENCOUNTER — Other Ambulatory Visit: Payer: Self-pay

## 2020-09-27 DIAGNOSIS — L989 Disorder of the skin and subcutaneous tissue, unspecified: Secondary | ICD-10-CM | POA: Diagnosis not present

## 2020-09-27 DIAGNOSIS — Z794 Long term (current) use of insulin: Secondary | ICD-10-CM

## 2020-09-27 DIAGNOSIS — E119 Type 2 diabetes mellitus without complications: Secondary | ICD-10-CM

## 2020-09-27 DIAGNOSIS — D169 Benign neoplasm of bone and articular cartilage, unspecified: Secondary | ICD-10-CM | POA: Diagnosis not present

## 2020-09-29 NOTE — Progress Notes (Signed)
Subjective: 40 year old female presents the office today for follow-up evaluation of a painful callus to her big toe joint, medial aspect.  She has been on her feet a lot more recently and the callus has been getting thicker. Denies any systemic complaints such as fevers, chills, nausea, vomiting. No acute changes since last appointment, and no other complaints at this time.   Last A1c was 11.1 on 08/28/2020  Objective: AAO x3, NAD DP/PT pulses palpable bilaterally, CRT less than 3 seconds Hyperkeratotic lesion medial hallux and there is some dried blood present however there is no underlying ulceration drainage or any signs of infection noted today.  No open lesions identified today. No pain with calf compression, swelling, warmth, erythema  Assessment: Preulcerative skin lesion  Plan: -All treatment options discussed with the patient including all alternatives, risks, complications.  -Debrided hyperkeratotic lesion with any complications or bleeding.  Discussed surgical intervention to help eliminate the pressure however given her uncontrolled diabetes we will hold off on this for now.  Continue offloading discussed shoe modifications.  Moisturizer and offloading.   Trula Slade DPM

## 2020-10-09 ENCOUNTER — Other Ambulatory Visit: Payer: Self-pay | Admitting: Nurse Practitioner

## 2020-10-09 DIAGNOSIS — F32A Depression, unspecified: Secondary | ICD-10-CM

## 2020-11-02 ENCOUNTER — Ambulatory Visit: Payer: Medicaid Other | Admitting: Internal Medicine

## 2020-11-02 NOTE — Progress Notes (Deleted)
Name: BRITLEE SKOLNIK  MRN/ DOB: [id number], March 19, 1981   Age/ Sex: 40 y.o., female    PCP: Minette Brine, FNP   Reason for Endocrinology Evaluation: Type 2 Diabetes Mellitus     Date of Initial Endocrinology Visit: 11/02/2020     PATIENT IDENTIFIER: Ms. MAILYN STEICHEN is a 40 y.o. female with a past medical history of T2DM, Asthma, OSA and HTN . The patient presented for initial endocrinology clinic visit on 11/02/2020 for consultative assistance with her diabetes management.    HPI: Ms. Choi was    Diagnosed with DM in 2012 Prior Medications tried/Intolerance: *** Currently checking blood sugars *** x / day,  before breakfast and ***.  Hypoglycemia episodes : ***               Symptoms: ***                 Frequency: ***/  Hemoglobin A1c has ranged from 8.4% in 2020, peaking at 11.1% in 2022. Patient required assistance for hypoglycemia:  Patient has required hospitalization within the last 1 year from hyper or hypoglycemia:   In terms of diet, the patient ***   HOME DIABETES REGIMEN: Metformin 750 mg XR  Ozempic 1 mg weekly  Farxiga 5 mg daily  Tresiba      Statin: yes ACE-I/ARB: yes Prior Diabetic Education: {Yes/No:11203}   METER DOWNLOAD SUMMARY: Date range evaluated: *** Fingerstick Blood Glucose Tests = *** Average Number Tests/Day = *** Overall Mean FS Glucose = *** Standard Deviation = ***  BG Ranges: Low = *** High = ***   Hypoglycemic Events/30 Days: BG < 50 = *** Episodes of symptomatic severe hypoglycemia = ***   DIABETIC COMPLICATIONS: Microvascular complications:   ***  Denies: CKD   Last eye exam: Completed   Macrovascular complications:    Denies: CAD, PVD, CVA   PAST HISTORY: Past Medical History:  Past Medical History:  Diagnosis Date  . Abnormal Pap smear    f/u wnl  . Asthma   . Fibroid   . Gestational diabetes   . Hypertension   . Morbid obesity ([place])   . Sleep apnea   . Urinary tract infection     Past  Surgical History:  Past Surgical History:  Procedure Laterality Date  . CESAREAN SECTION    . DILATION AND CURETTAGE OF UTERUS    . TUBAL LIGATION        Social History:  reports that she quit smoking about 14 months ago. She has never used smokeless tobacco. She reports that she does not drink alcohol and does not use drugs. Family History:  Family History  Problem Relation Age of Onset  . Diabetes Father   . Allergies Mother   . Asthma Mother   . Hypertension Mother   . Diabetes Mother   . Diabetes Maternal Grandmother   . Heart disease Maternal Grandmother   . Anesthesia problems Neg Hx   . Hypotension Neg Hx   . Malignant hyperthermia Neg Hx   . Pseudochol deficiency Neg Hx   . Hearing loss Neg Hx       HOME MEDICATIONS: Allergies as of 11/02/2020      Reactions   Other Anaphylaxis, Other (See Comments)   Pt has fish allergy Pt has allergy to butter cookies   Penicillins Rash, Other (See Comments)   Told MD that PCN reaction is rash and that she has taken amoxicillin before   Bactrim [sulfamethoxazole-trimethoprim]    Diarrhea, abdominal  cramps and rash      Medication List       Accurate as of November 02, 2020 12:54 PM. If you have any questions, ask your nurse or doctor.        accu-chek multiclix lancets USE TO CHECK BLOOD SUGARS UP TO 4 TIMES DAILY  DX CODE:E11.9   albuterol (5 MG/ML) 0.5% nebulizer solution Commonly known as: PROVENTIL Take 0.5 mLs (2.5 mg total) by nebulization every 6 (six) hours as needed for wheezing or shortness of breath.   albuterol 108 (90 Base) MCG/ACT inhaler Commonly known as: VENTOLIN HFA Inhale 1-2 puffs into the lungs every 6 (six) hours as needed for wheezing or shortness of breath.   atorvastatin 20 MG tablet Commonly known as: LIPITOR Take 1 tablet (20 mg total) by mouth daily.   blood glucose meter kit and supplies Kit Dispense based on patient and insurance preference. Use up to four times daily as directed. (FOR  ICD-9 250.00, 250.01).   buPROPion 300 MG 24 hr tablet Commonly known as: WELLBUTRIN XL TAKE 1 TABLET(300 MG) BY MOUTH AT BEDTIME   cetirizine 10 MG tablet Commonly known as: ZyrTEC Allergy Take 1 tablet (10 mg total) by mouth daily.   dapagliflozin propanediol 5 MG Tabs tablet Commonly known as: Farxiga Take 1 tablet (5 mg total) by mouth daily before breakfast.   diclofenac 50 MG EC tablet Commonly known as: VOLTAREN Take 1 tablet (50 mg total) by mouth 3 (three) times daily.   ferrous sulfate 325 (65 FE) MG EC tablet Take 1 tablet (325 mg total) by mouth 3 (three) times daily with meals.   fluticasone 50 MCG/ACT nasal spray Commonly known as: FLONASE Place 1-2 sprays into both nostrils daily.   glucose blood test strip USE TO CHECK BLOOD SUGARS UP TO 4 TIMES DAILY  DX CODE:E11.9   ibuprofen 800 MG tablet Commonly known as: ADVIL Take 800 mg by mouth 3 (three) times daily.   levonorgestrel 20 MCG/24HR IUD Commonly known as: MIRENA 1 each by Intrauterine route once.   losartan-hydrochlorothiazide 50-12.5 MG tablet Commonly known as: HYZAAR Take 1 tablet by mouth daily.   metFORMIN 750 MG 24 hr tablet Commonly known as: GLUCOPHAGE-XR TAKE 1 TABLET(750 MG) BY MOUTH IN THE MORNING AND AT BEDTIME   Naltrexone-buPROPion HCl ER 8-90 MG Tb12 Take 1 tablet by mouth daily for 1 week then increase to 1 tablet by mouth twice a day x 1 week then increase to 1 tab by mouth am and 2 tabs by mouth pm x 1 week then 2 tabs by mouth twice a day   Ozempic (1 MG/DOSE) 4 MG/3ML Sopn Generic drug: Semaglutide (1 MG/DOSE) Inject 1 mg into the skin once a week.   propranolol 10 MG tablet Commonly known as: INDERAL Take 1 tablet (10 mg total) by mouth every evening.   Tyler Aas FlexTouch 100 UNIT/ML FlexTouch Pen Generic drug: insulin degludec Inject 0.35 mLs (35 Units total) into the skin daily.   triamcinolone 0.025 % ointment Commonly known as: KENALOG Apply 1 application  topically 2 (two) times daily.        ALLERGIES: Allergies  Allergen Reactions  . Other Anaphylaxis and Other (See Comments)    Pt has fish allergy Pt has allergy to butter cookies  . Penicillins Rash and Other (See Comments)    Told MD that PCN reaction is rash and that she has taken amoxicillin before  . Bactrim [Sulfamethoxazole-Trimethoprim]     Diarrhea, abdominal cramps and rash  REVIEW OF SYSTEMS: A comprehensive ROS was conducted with the patient and is negative except as per HPI and below:  ROS    OBJECTIVE:   VITAL SIGNS: There were no vitals taken for this visit.   PHYSICAL EXAM:  General: Pt appears well and is in NAD  Hydration: Well-hydrated with moist mucous membranes and good skin turgor  HEENT: Head: Unremarkable with good dentition. Oropharynx clear without exudate.  Eyes: External eye exam normal without stare, lid lag or exophthalmos.  EOM intact.  PERRL.  Neck: General: Supple without adenopathy or carotid bruits. Thyroid: Thyroid size normal.  No goiter or nodules appreciated. No thyroid bruit.  Lungs: Clear with good BS bilat with no rales, rhonchi, or wheezes  Heart: RRR with normal S1 and S2 and no gallops; no murmurs; no rub  Abdomen: Normoactive bowel sounds, soft, nontender, without masses or organomegaly palpable  Extremities:  Lower extremities - No pretibial edema. No lesions.  Skin: Normal texture and temperature to palpation. No rash noted. No Acanthosis nigricans/skin tags. No lipohypertrophy.  Neuro: MS is good with appropriate affect, pt is alert and Ox3    DM foot exam:    DATA REVIEWED:  Lab Results  Component Value Date   HGBA1C 11.1 (H) 08/28/2020   HGBA1C 10.1 (H) 05/08/2020   HGBA1C 11.6 (H) 02/29/2020   Lab Results  Component Value Date   MICROALBUR 30 05/08/2020   LDLCALC 113 (H) 08/28/2020   CREATININE 0.80 08/28/2020   Lab Results  Component Value Date   MICRALBCREAT 30 05/08/2020    Lab Results   Component Value Date   CHOL 182 08/28/2020   HDL 34 (L) 08/28/2020   LDLCALC 113 (H) 08/28/2020   TRIG 197 (H) 08/28/2020   CHOLHDL 5.4 (H) 08/28/2020        ASSESSMENT / PLAN / RECOMMENDATIONS:   1) Type 2 Diabetes Mellitus, Poorly controlled, With*** complications - Most recent A1c of *** %. Goal A1c < 7.0 %.    Plan: GENERAL:  ***  MEDICATIONS:  ***  EDUCATION / INSTRUCTIONS:  BG monitoring instructions: Patient is instructed to check her blood sugars *** times a day, ***.  Call [facility] if: BG persistently < 70  . I reviewed the Rule of 15 for the treatment of hypoglycemia in detail with the patient. Literature supplied.   2) Diabetic complications:   Eye: Does *** have known diabetic retinopathy.   Neuro/ Feet: Does *** have known diabetic peripheral neuropathy.  Renal: Patient does not have known baseline CKD. She is *** on an ACEI/ARB at present.Check urine albumin/creatinine ratio yearly starting at time of diagnosis. If albuminuria is positive, treatment is geared toward better glucose, blood pressure control and use of ACE inhibitors or ARBs. Monitor electrolytes and creatinine once to twice yearly.   3) Dyslipidemia:   - LDL elevated at 113 mg/dL and Tg elevated at 197 mg/dL   - Patient is on atorvastatin 20 mg daily         Signed electronically by: Mack Guise, MD  [facility] Endocrinology  [facility] [address] [place], [place] [address] Phone: [phone] FAX: [phone]   CC: Minette Brine, [address] [place] [address] [place] [address] Phone: [phone]  Fax: [phone]    Return to Endocrinology clinic as below: Future Appointments  Date Time Provider [place]  11/02/2020  3:20 PM Gracemarie Skeet, Melanie Crazier, MD [facility] None  11/05/2020  4:15 PM Marzetta Board,  DPM [facility]  11/26/2020  4:15 PM Minette Brine, FNP [facility] None   11/28/2020  9:45 AM [facility] CASE MANAGER [facility] None

## 2020-11-05 ENCOUNTER — Ambulatory Visit: Payer: Medicaid Other | Admitting: Podiatry

## 2020-11-05 ENCOUNTER — Other Ambulatory Visit: Payer: Self-pay

## 2020-11-05 DIAGNOSIS — M2142 Flat foot [pes planus] (acquired), left foot: Secondary | ICD-10-CM

## 2020-11-05 DIAGNOSIS — E1165 Type 2 diabetes mellitus with hyperglycemia: Secondary | ICD-10-CM

## 2020-11-05 DIAGNOSIS — L989 Disorder of the skin and subcutaneous tissue, unspecified: Secondary | ICD-10-CM

## 2020-11-05 DIAGNOSIS — M2141 Flat foot [pes planus] (acquired), right foot: Secondary | ICD-10-CM | POA: Diagnosis not present

## 2020-11-11 ENCOUNTER — Encounter: Payer: Self-pay | Admitting: Podiatry

## 2020-11-11 NOTE — Progress Notes (Signed)
  Subjective:  Patient ID: [name], female    DOB: 07-01-1981,  MRN: [id number]  40 y.o. female presents with preventative diabetic foot care and follow up painful lesion b/l hallux She is afraid to trim due to diagnosis of diabetes.    Patient did not check blood glucose this morning. Last A1c was 11%.  PCP: Minette Brine, FNP and last visit was: 08/28/2020.  Review of Systems: Negative except as noted in the HPI.   Allergies  Allergen Reactions  . Other Anaphylaxis and Other (See Comments)    Pt has fish allergy Pt has allergy to butter cookies  . Penicillins Rash and Other (See Comments)    Told MD that PCN reaction is rash and that she has taken amoxicillin before  . Bactrim [Sulfamethoxazole-Trimethoprim]     Diarrhea, abdominal cramps and rash    Objective:  There were no vitals filed for this visit. Constitutional Patient is a pleasant 40 y.o. African American female morbidly obese in NAD. AAO x 3.  Vascular Capillary refill time to digits immediate b/l. Palpable pedal pulses b/l LE. Pedal hair present. Lower extremity skin temperature gradient within normal limits. No pain with calf compression b/l. No cyanosis or clubbing noted.  Neurologic Normal speech. Protective sensation intact 5/5 intact bilaterally with 10g monofilament b/l. Vibratory sensation intact b/l. Proprioception intact bilaterally.  Dermatologic Pedal skin with normal turgor, texture and tone bilaterally. No open wounds bilaterally. No interdigital macerations bilaterally. Toenails 1-5 b/l well maintained with adequate length. No erythema, no edema, no drainage, no fluctuance.  Orthopedic: Normal muscle strength 5/5 to all lower extremity muscle groups bilaterally. No pain crepitus or joint limitation noted with ROM b/l. Pes planus deformity noted b/l.    Hemoglobin A1C Latest Ref Rng & Units 08/28/2020 05/08/2020 02/29/2020 11/30/2019  HGBA1C 4.8 - 5.6 % 11.1(H) 10.1(H) 11.6(H) 11.2(H)  Some recent data  might be hidden   Assessment:   1. Benign skin lesion   2. Pes planus of both feet   3. Uncontrolled type 2 diabetes mellitus with hyperglycemia ([place])    Plan:  Patient was evaluated and treated and all questions answered. -Examined patient. -Patient to continue soft, supportive shoe gear daily. -As a courtesy, benign skin lesion pared b/l hallux IPJ without incident. -Patient to report any pedal injuries to medical professional immediately. -Recommended [employer] Shoes for daily wear and arch support. -Patient/POA to call should there be question/concern in the interim.  Return in about 3 months (around 02/04/2021).  Marzetta Board, DPM

## 2020-11-26 ENCOUNTER — Ambulatory Visit: Payer: Medicaid Other | Admitting: Nurse Practitioner

## 2020-11-28 ENCOUNTER — Telehealth: Payer: Medicaid Other

## 2020-11-28 ENCOUNTER — Telehealth: Payer: Self-pay

## 2020-11-28 NOTE — Telephone Encounter (Signed)
   Care Management   Outreach Note  11/28/2020 Name: [name] MRN: [id number] DOB: [birth year]  Referred by: Minette Brine, FNP Reason for referral : Chronic Care Management (RN CM FU Call Attempt )   An unsuccessful telephone outreach was attempted today. The patient was referred to the case management team for assistance with care management and care coordination.   Follow Up Plan: Telephone follow up appointment with care management team member scheduled for: 12/13/20  Barb Merino, RN, BSN, CCM Care Management Coordinator [place] [facility]  Direct Phone: [phone]

## 2020-11-30 ENCOUNTER — Encounter: Payer: Self-pay | Admitting: Internal Medicine

## 2020-11-30 ENCOUNTER — Ambulatory Visit: Payer: Medicaid Other | Admitting: Internal Medicine

## 2020-11-30 ENCOUNTER — Other Ambulatory Visit: Payer: Self-pay

## 2020-11-30 VITALS — BP 140/92 | HR 98 | Ht 67.0 in | Wt 353.2 lb

## 2020-11-30 DIAGNOSIS — E785 Hyperlipidemia, unspecified: Secondary | ICD-10-CM | POA: Diagnosis not present

## 2020-11-30 DIAGNOSIS — R635 Abnormal weight gain: Secondary | ICD-10-CM | POA: Insufficient documentation

## 2020-11-30 DIAGNOSIS — E1165 Type 2 diabetes mellitus with hyperglycemia: Secondary | ICD-10-CM | POA: Diagnosis not present

## 2020-11-30 LAB — POCT GLYCOSYLATED HEMOGLOBIN (HGB A1C): Hemoglobin A1C: 10 % — AB (ref 4.0–5.6)

## 2020-11-30 LAB — POCT GLUCOSE (DEVICE FOR HOME USE): POC Glucose: 222 mg/dl — AB (ref 70–99)

## 2020-11-30 MED ORDER — DAPAGLIFLOZIN PROPANEDIOL 10 MG PO TABS: 10.0000 mg | ORAL_TABLET | Freq: Every day | ORAL | 1 refills | Status: DC

## 2020-11-30 MED ORDER — TRESIBA FLEXTOUCH 100 UNIT/ML ~~LOC~~ SOPN: 45.0000 [IU] | PEN_INJECTOR | Freq: Every day | SUBCUTANEOUS | 2 refills | Status: DC

## 2020-11-30 MED ORDER — INSULIN PEN NEEDLE 31G X 8 MM MISC: 1.0000 | Freq: Every day | 1 refills | Status: DC

## 2020-11-30 NOTE — Progress Notes (Signed)
Name: [name]  MRN/ DOB: [id number], Jun 04, 1981   Age/ Sex: 40 y.o., female    PCP: Minette Brine, FNP   Reason for Endocrinology Evaluation: Type 2 Diabetes Mellitus     Date of Initial Endocrinology Visit: 11/30/2020     PATIENT IDENTIFIER: [name] is a 40 y.o. female with a past medical history of T2DM, OSA, Dyslipidemia and HTN. The patient presented for initial endocrinology clinic visit on 11/30/2020 for consultative assistance with her diabetes management.    HPI: [name] was    Diagnosed with gestational diabetes in 2012  And never resolved after delivery Prior Medications tried/Intolerance:  Currently checking blood sugars  0 x / day Hypoglycemia episodes : no             Hemoglobin A1c has ranged from 7.1% in 2021, peaking at 11.1 in 2022. 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 Abnormal Pap smear    f/u wnl  . Asthma   . Fibroid   . Gestational diabetes   . Hypertension   . Morbid obesity ([place])   . Sleep apnea   . Urinary tract infection    Past Surgical History:  Past Surgical History:  Procedure Laterality Date  . CESAREAN SECTION    . DILATION AND CURETTAGE OF UTERUS    . TUBAL LIGATION         Social History:  reports that she quit smoking about 15 months ago. She has never used smokeless tobacco. She reports that she does not drink alcohol and does not use drugs. Family History:  Family History  Problem Relation Age of Onset  . Diabetes Father   . Allergies Mother   . Asthma Mother   . Hypertension Mother   . Diabetes Mother   . Diabetes Maternal Grandmother   . Heart disease Maternal Grandmother   . Anesthesia problems Neg Hx   . Hypotension Neg Hx   . Malignant hyperthermia Neg Hx   . Pseudochol deficiency Neg Hx   . Hearing loss Neg Hx      HOME MEDICATIONS: Allergies as of 11/30/2020      Reactions   Other Anaphylaxis, Other (See Comments)   Pt has fish allergy Pt has allergy to butter cookies   Penicillins Rash, Other (See Comments)   Told MD that PCN reaction is rash and that she has taken amoxicillin before   Bactrim [sulfamethoxazole-trimethoprim]    Diarrhea, abdominal cramps and rash      Medication List       Accurate as of November 30, 2020  8:22 AM. If you have any questions, ask your nurse or doctor.  accu-chek multiclix lancets USE TO CHECK BLOOD SUGARS UP TO 4 TIMES DAILY  DX CODE:E11.9   albuterol (5 MG/ML) 0.5% nebulizer solution Commonly known as: PROVENTIL Take 0.5 mLs (2.5 mg total) by nebulization every 6 (six) hours as needed for wheezing or shortness of breath.   albuterol 108 (90 Base) MCG/ACT inhaler Commonly known as: VENTOLIN HFA Inhale 1-2 puffs into the lungs every 6 (six) hours as needed for wheezing or shortness of breath.   atorvastatin 20 MG tablet Commonly known as: LIPITOR Take 1 tablet (20 mg total) by mouth daily.   blood glucose meter kit and supplies Kit Dispense based on patient and insurance preference. Use up to four times daily as directed. (FOR ICD-9 250.00, 250.01).   buPROPion 300 MG 24 hr tablet Commonly known as: WELLBUTRIN XL TAKE 1 TABLET(300 MG) BY MOUTH AT BEDTIME   cetirizine 10  MG tablet Commonly known as: ZyrTEC Allergy Take 1 tablet (10 mg total) by mouth daily.   dapagliflozin propanediol 5 MG Tabs tablet Commonly known as: Farxiga Take 1 tablet (5 mg total) by mouth daily before breakfast.   diclofenac 50 MG EC tablet Commonly known as: VOLTAREN Take 1 tablet (50 mg total) by mouth 3 (three) times daily.   ferrous sulfate 325 (65 FE) MG EC tablet Take 1 tablet (325 mg total) by mouth 3 (three) times daily with meals.   fluticasone 50 MCG/ACT nasal spray Commonly known as: FLONASE Place 1-2 sprays into both nostrils daily.   glucose blood test strip USE TO CHECK BLOOD SUGARS UP TO 4 TIMES DAILY  DX CODE:E11.9   ibuprofen 800 MG tablet Commonly known as: ADVIL Take 800 mg by mouth 3 (three) times daily.   levonorgestrel 20 MCG/24HR IUD Commonly known as: MIRENA 1 each by Intrauterine route once.   losartan-hydrochlorothiazide 50-12.5 MG tablet Commonly known as: HYZAAR Take 1 tablet by mouth daily.   metFORMIN 750 MG 24 hr tablet Commonly known as: GLUCOPHAGE-XR TAKE 1 TABLET(750 MG) BY MOUTH IN THE MORNING AND AT BEDTIME   Naltrexone-buPROPion HCl ER 8-90 MG Tb12 Take 1 tablet by mouth daily for 1 week then increase to 1 tablet by mouth twice a day x 1 week then increase to 1 tab by mouth am and 2 tabs by mouth pm x 1 week then 2 tabs by mouth twice a day   Ozempic (1 MG/DOSE) 4 MG/3ML Sopn Generic drug: Semaglutide (1 MG/DOSE) Inject 1 mg into the skin once a week.   propranolol 10 MG tablet Commonly known as: INDERAL Take 1 tablet (10 mg total) by mouth every evening.   Tyler Aas FlexTouch 100 UNIT/ML FlexTouch Pen Generic drug: insulin degludec Inject 0.35 mLs (35 Units total) into the skin daily.   triamcinolone 0.025 % ointment Commonly known as: KENALOG Apply 1 application topically 2 (two) times daily.        ALLERGIES: Allergies  Allergen Reactions  . Other Anaphylaxis and Other (See Comments)    Pt has fish  allergy Pt has allergy to butter cookies  . Penicillins Rash and Other (See Comments)    Told MD that PCN reaction is rash and that she has taken amoxicillin before  . Bactrim [Sulfamethoxazole-Trimethoprim]     Diarrhea, abdominal cramps and rash     REVIEW OF SYSTEMS: A comprehensive ROS was conducted with the patient and is negative except as per HPI and below:  Review of Systems  Gastrointestinal: Negative for diarrhea and nausea.  Neurological: Positive for tingling.  Occasional numbness       OBJECTIVE:   VITAL SIGNS: BP (!) 140/92   Pulse 98   Ht _0  (1.702 m)   Wt (!) 353 lb 4 oz (160.2 kg)   SpO2 99%   BMI 55.33 kg/m    PHYSICAL EXAM:  General: Pt appears well and is in NAD  Neck: General: Supple without adenopathy or carotid bruits. Thyroid: Thyroid size normal.  No goiter or nodules appreciated  Lungs: Clear with good BS bilat with no rales, rhonchi, or wheezes  Heart: RRR with normal S1 and S2 and no gallops; no murmurs; no rub  Abdomen: Normoactive bowel sounds, soft, nontender, without masses or organomegaly palpable  Extremities:  Lower extremities - No pretibial edema. No lesions.  Skin: Normal texture and temperature to palpation. No rash noted.   Neuro: MS is good with appropriate affect, pt is alert and Ox3    DM foot exam: 4/29/202  The skin of the feet is without sores or ulcerations, callous formation on the medial aspect of the left MTJ The pedal pulses are 2+ on right and 2+ on left. The sensation is intact to a screening 5.07, 10 gram monofilament bilaterally   DATA REVIEWED:  Lab Results  Component Value Date   HGBA1C 10.0 (A) 11/30/2020   HGBA1C 11.1 (H) 08/28/2020   HGBA1C 10.1 (H) 05/08/2020   Lab Results  Component Value Date   MICROALBUR 30 05/08/2020   LDLCALC 113 (H) 08/28/2020   CREATININE 0.80 08/28/2020   Lab Results  Component Value Date   MICRALBCREAT 30 05/08/2020    Lab Results  Component Value Date    CHOL 182 08/28/2020   HDL 34 (L) 08/28/2020   LDLCALC 113 (H) 08/28/2020   TRIG 197 (H) 08/28/2020   CHOLHDL 5.4 (H) 08/28/2020        ASSESSMENT / PLAN / RECOMMENDATIONS:   1) Type 2 Diabetes Mellitus, Poorly controlled, Without complications - Most recent A1c of 10.0 %. Goal A1c < 7.0 %.    Plan: GENERAL: I have discussed with the patient the pathophysiology of diabetes. We went over the natural progression of the disease. We talked about both insulin resistance and insulin deficiency. We stressed the importance of lifestyle changes including diet and exercise. I explained the complications associated with diabetes including retinopathy, nephropathy, neuropathy as well as increased risk of cardiovascular disease. We went over the benefit seen with glycemic control.    I explained to the patient that diabetic patients are at higher than normal risk for amputations.  Will increase insulin as well as fraxiga as below   Advised pt to avoid sugar-sweetened beverages and follow a low carb diet   MEDICATIONS:  - Increase tresiba to 45 units daily  - Increase Farxiga to 10 mg, 1 tablet in the morning  - Continue Metformin 750 mg, 1 tablet twice daily  - Continue Ozempic 1 mg weekly    EDUCATION / INSTRUCTIONS:  BG monitoring instructions: Patient is instructed to check her blood sugars 1 times a day, fasting.  Call [facility] if: BG persistently < 70  . I reviewed the Rule of 15 for the treatment of hypoglycemia in detail with the patient. Literature supplied.   2) Diabetic complications:   Eye: Does not have known diabetic retinopathy.   Neuro/ Feet: Does not have known diabetic peripheral neuropathy.  Renal: Patient does not have known baseline CKD. She is on an ACEI/ARB at present.   3) Dyslipidemia:  Patient is on atorvastatin 20 mg daily  . Discussed cardiovascular benefits of statins     4) BMI 55:   - She is interested in bariatric surgery,  will proceed with cushing syndrome screening first , prior to referring her to bariatric surgery.    F/U in 3 months     Signed electronically by: Mack Guise, MD  [facility] End[facility]  [address] [place], [place] [address] Phone: [phone] FAX: [phone]   CC: Minette Brine, [place] [address] Phone: [phone]  Fax: [phone]    Return to Endocrinology clinic as below: Future Appointments  Date Time Provider [place]  12/05/2020 10:00 AM Minette Brine, FNP [facility] None  12/13/2020  9:00 AM [facility] CASE MANAGER [facility] None  02/19/2021  4:15 PM Marzetta Board, DPM [facility]

## 2020-11-30 NOTE — Patient Instructions (Signed)
-   Increase tresiba to 45 units daily  - Increase Farxiga to 10 mg, 1 tablet in the morning  - Continue Metformin 750 mg, 1 tablet twice daily  - Continue Ozempic 1 mg weekly     Choose healthy, lower carb lower calorie snacks: toss salad, vegetables, cottage cheese, peanut butter, low fat cheese / string cheese, lower sodium deli meat, tuna salad or chicken salad    HOW TO TREAT LOW BLOOD SUGARS (Blood sugar LESS THAN 70 MG/DL)  Please follow the RULE OF 15 for the treatment of hypoglycemia treatment (when your (blood sugars are less than 70 mg/dL)    STEP 1: Take 15 grams of carbohydrates when your blood sugar is low, which includes:   3-4 GLUCOSE TABS  OR  3-4 OZ OF JUICE OR REGULAR SODA OR  ONE TUBE OF GLUCOSE GEL     STEP 2: RECHECK blood sugar in 15 MINUTES STEP 3: If your blood sugar is still low at the 15 minute recheck --> then, go back to STEP 1 and treat AGAIN with another 15 grams of carbohydrates.

## 2020-12-05 ENCOUNTER — Encounter: Payer: Self-pay | Admitting: Nurse Practitioner

## 2020-12-05 ENCOUNTER — Ambulatory Visit (INDEPENDENT_AMBULATORY_CARE_PROVIDER_SITE_OTHER): Payer: Medicaid Other | Admitting: Nurse Practitioner

## 2020-12-05 ENCOUNTER — Other Ambulatory Visit: Payer: Self-pay

## 2020-12-05 VITALS — BP 138/90 | HR 98 | Temp 98.7°F | Ht 67.0 in | Wt 350.8 lb

## 2020-12-05 DIAGNOSIS — E1165 Type 2 diabetes mellitus with hyperglycemia: Secondary | ICD-10-CM

## 2020-12-05 DIAGNOSIS — E782 Mixed hyperlipidemia: Secondary | ICD-10-CM | POA: Diagnosis not present

## 2020-12-05 DIAGNOSIS — I10 Essential (primary) hypertension: Secondary | ICD-10-CM

## 2020-12-05 DIAGNOSIS — Z6841 Body Mass Index (BMI) 40.0 and over, adult: Secondary | ICD-10-CM

## 2020-12-05 MED ORDER — PNEUMOCOCCAL 13-VAL CONJ VACC IM SUSP: 0.5000 mL | INTRAMUSCULAR | 0 refills | Status: AC

## 2020-12-05 NOTE — Progress Notes (Signed)
I,Yamilka Roman [employer] as a Education administrator for [employer], FNP.,have documented all relevant documentation on the behalf of Minette Brine, FNP,as directed by  Minette Brine, FNP while in the presence of Minette Brine, [place]. This visit occurred during the SARS-CoV-2 public health emergency.  Safety protocols were in place, including screening questions prior to the visit, additional usage of staff PPE, and extensive cleaning of exam room while observing appropriate contact time as indicated for disinfecting solutions.  Subjective:     Patient ID: [name] , female    DOB: October 25, 1980 , 40 y.o.   MRN: [id number]   Chief Complaint  Patient presents with  . Diabetes  . Hypertension    HPI  Patient presents today for a dm and htn f/u. She stated she went to see her endocrinologist on Friday. She reports her A1C is down 10.  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  She has seen Dr. Charlett Lango for her diabetes and was increased with tresiba to 45 units and 10 mg farxiga and continues with metformin and ozempic  Wt Readings from Last 3 Encounters: 12/05/20 : (!) 350 lb 12.8 oz (159.1 kg) 11/30/20 : (!) 353 lb 4 oz (160.2 kg) 08/28/20 : (!) 358 lb 6.4 oz (162.6 kg)  Hypertension This is a chronic problem. The current episode started more than 1 year ago. Pertinent negatives include no chest pain, headaches or palpitations.     Past Medical History:  Diagnosis Date  . Abnormal Pap smear    f/u wnl  . Asthma   . Fibroid   . Gestational diabetes   . Hypertension   . Morbid obesity ([place])   . Sleep apnea   . Urinary tract infection      Family History  Problem Relation Age of Onset  . Diabetes Father   . Allergies Mother   . Asthma Mother   . Hypertension Mother   . Diabetes Mother   . Diabetes Maternal Grandmother   . Heart disease Maternal Grandmother   . Anesthesia problems Neg Hx   . Hypotension Neg Hx   . Malignant hyperthermia Neg Hx   . Pseudochol  deficiency Neg Hx   . Hearing loss Neg Hx      Current Outpatient Medications:  .  albuterol (PROVENTIL) (5 MG/ML) 0.5% nebulizer solution, Take 0.5 mLs (2.5 mg total) by nebulization every 6 (six) hours as needed for wheezing or shortness of breath., Disp: 20 mL, Rfl: 12 .  albuterol (VENTOLIN HFA) 108 (90 Base) MCG/ACT inhaler, Inhale 1-2 puffs into the lungs every 6 (six) hours as needed for wheezing or shortness of breath., Disp: 18 g, Rfl: 0 .  atorvastatin (LIPITOR) 20 MG tablet, Take 1 tablet (20 mg total) by mouth daily., Disp: 90 tablet, Rfl: 1 .  blood glucose meter kit and supplies KIT, Dispense based on patient and insurance preference. Use up to four times daily as directed. (FOR ICD-9 250.00, 250.01)., Disp: 1 each, Rfl: 0 .  buPROPion (WELLBUTRIN XL) 300 MG 24 hr tablet, TAKE 1 TABLET(300 MG) BY MOUTH AT BEDTIME, Disp: 90 tablet, Rfl: 1 .  cetirizine (ZYRTEC ALLERGY) 10 MG tablet, Take 1 tablet (10 mg total) by mouth daily., Disp: 90 tablet, Rfl: 0 .  dapagliflozin propanediol (FARXIGA) 10 MG TABS tablet, Take 1 tablet (10 mg total) by mouth daily., Disp: 90 tablet, Rfl: 1 .  diclofenac (VOLTAREN) 50 MG EC tablet, Take 1 tablet (50 mg total) by mouth 3 (three) times  daily., Disp: 45 tablet, Rfl: 3 .  ferrous sulfate 325 (65 FE) MG EC tablet, Take 1 tablet (325 mg total) by mouth 3 (three) times daily with meals., Disp: 90 tablet, Rfl: 2 .  fluticasone (FLONASE) 50 MCG/ACT nasal spray, Place 1-2 sprays into both nostrils daily., Disp: 16 g, Rfl: 0 .  glucose blood test strip, USE TO CHECK BLOOD SUGARS UP TO 4 TIMES DAILY  DX CODE:E11.9, Disp: 100 each, Rfl: 12 .  ibuprofen (ADVIL) 800 MG tablet, Take 800 mg by mouth 3 (three) times daily., Disp: , Rfl:  .  insulin degludec (TRESIBA FLEXTOUCH) 100 UNIT/ML FlexTouch Pen, Inject 45 Units into the skin daily., Disp: 45 mL, Rfl: 2 .  Insulin Pen Needle 31G X 8 MM MISC, 1 Device by Does not apply route daily., Disp: 100 each, Rfl: 1 .   Lancets (ACCU-CHEK MULTICLIX) lancets, USE TO CHECK BLOOD SUGARS UP TO 4 TIMES DAILY  DX CODE:E11.9, Disp: 100 each, Rfl: 12 .  levonorgestrel (MIRENA) 20 MCG/24HR IUD, 1 each by Intrauterine route once., Disp: , Rfl:  .  losartan-hydrochlorothiazide (HYZAAR) 50-12.5 MG tablet, Take 1 tablet by mouth daily., Disp: 90 tablet, Rfl: 1 .  metFORMIN (GLUCOPHAGE-XR) 750 MG 24 hr tablet, TAKE 1 TABLET(750 MG) BY MOUTH IN THE MORNING AND AT BEDTIME, Disp: 180 tablet, Rfl: 1 .  Naltrexone-buPROPion HCl ER 8-90 MG TB12, Take 1 tablet by mouth daily for 1 week then increase to 1 tablet by mouth twice a day x 1 week then increase to 1 tab by mouth am and 2 tabs by mouth pm x 1 week then 2 tabs by mouth twice a day, Disp: 120 tablet, Rfl: 1 .  propranolol (INDERAL) 10 MG tablet, Take 1 tablet (10 mg total) by mouth every evening., Disp: 90 tablet, Rfl: 1 .  Semaglutide, 1 MG/DOSE, (OZEMPIC, 1 MG/DOSE,) 4 MG/3ML SOPN, Inject 1 mg into the skin once a week., Disp: 9 mL, Rfl: 1 .  triamcinolone (KENALOG) 0.025 % ointment, Apply 1 application topically 2 (two) times daily., Disp: 30 g, Rfl: 0   Allergies  Allergen Reactions  . Other Anaphylaxis and Other (See Comments)    Pt has fish allergy Pt has allergy to butter cookies  . Penicillins Rash and Other (See Comments)    Told MD that PCN reaction is rash and that she has taken amoxicillin before  . Bactrim [Sulfamethoxazole-Trimethoprim]     Diarrhea, abdominal cramps and rash     Review of Systems  Constitutional: Negative.   HENT: Negative.   Respiratory: Negative.  Negative for cough and wheezing.   Cardiovascular: Negative.  Negative for chest pain, palpitations and leg swelling.  Endocrine: Negative for polydipsia, polyphagia and polyuria.  Musculoskeletal: Negative.   Neurological: Negative.  Negative for dizziness and headaches.  Psychiatric/Behavioral: Negative.      Today's Vitals   12/05/20 1026  BP: 138/90  Pulse: 98  Temp: 98.7 F (37.1  C)  Weight: (!) 350 lb 12.8 oz (159.1 kg)  Height: _0  (1.702 m)  PainSc: 0-No pain   Body mass index is 54.94 kg/m.   Objective:  Physical Exam Constitutional:      General: She is not in acute distress.    Appearance: Normal appearance. She is obese.  Eyes:     Pupils: Pupils are equal, round, and reactive to light.  Cardiovascular:     Rate and Rhythm: Normal rate and regular rhythm.     Pulses: Normal pulses.  Heart sounds: Normal heart sounds. No murmur heard.   Pulmonary:     Effort: Pulmonary effort is normal. No respiratory distress.     Breath sounds: Normal breath sounds.  Musculoskeletal:     Right lower leg: No edema.     Left lower leg: No edema.  Skin:    General: Skin is warm and dry.     Capillary Refill: Capillary refill takes less than 2 seconds.  Neurological:     General: No focal deficit present.     Mental Status: She is alert and oriented to person, place, and time.     Cranial Nerves: No cranial nerve deficit.  Psychiatric:        Mood and Affect: Mood normal.        Behavior: Behavior normal.        Thought Content: Thought content normal.        Judgment: Judgment normal.         Assessment And Plan:     1. Uncontrolled type 2 diabetes mellitus with hyperglycemia (HCC)  Chronic, improving according to her last HgbA1c with Dr. Renne Crigler down to 10.1 from 11.1  Continue with current medications and follow up with Dr. Renne Crigler  Encouraged to limit intake of sugary foods and drinks  Encouraged to increase physical activity to 150 minutes per week - pneumococcal 13-valent conjugate vaccine (PREVNAR 13) SUSP injection; Inject 0.5 mLs into the muscle tomorrow at 10 am for 1 dose.  Dispense: 0.5 mL; Refill: 0  2. Essential hypertension  Chronic, blood pressure is slightly elevated but is improved from previous - CMP14+EGFR  3. Mixed hyperlipidemia  Chronic, controlled  Continue with current medications, continue with atorvastatin -  Lipid panel  4. 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 Patient verbalized understanding of the plan and was able to repeat key elements of the plan. All questions were answered to their satisfaction.  Minette Brine, FNP   I, Minette Brine, FNP, have reviewed all documentation for this visit. The documentation on 12/12/20 for the exam, diagnosis, procedures, and orders are all accurate and complete.   IF YOU HAVE BEEN REFERRED TO A SPECIALIST, IT MAY TAKE 1-2 WEEKS TO SCHEDULE/PROCESS THE REFERRAL. IF YOU HAVE NOT HEARD FROM US/SPECIALIST IN TWO WEEKS, PLEASE GIVE [place] A CALL AT [phone].   THE PATIENT IS ENCOURAGED TO PRACTICE SOCIAL DISTANCING DUE TO THE COVID-19 PANDEMIC.

## 2020-12-05 NOTE — Patient Instructions (Signed)

## 2020-12-06 LAB — LIPID PANEL
Chol/HDL Ratio: 4.2 ratio (ref 0.0–4.4)
Cholesterol, Total: 175 mg/dL (ref 100–199)
HDL: 42 mg/dL (ref >39)
LDL Chol Calc (NIH): 114 mg/dL — ABNORMAL HIGH (ref 0–99)
Triglycerides: 105 mg/dL (ref 0–149)
VLDL Cholesterol Cal: 19 mg/dL (ref 5–40)

## 2020-12-06 LAB — CMP14+EGFR
ALT: 21 IU/L (ref 0–32)
AST: 15 IU/L (ref 0–40)
Albumin/Globulin Ratio: 1.7 (ref 1.2–2.2)
Albumin: 4.3 g/dL (ref 3.8–4.8)
Alkaline Phosphatase: 58 IU/L (ref 44–121)
BUN/Creatinine Ratio: 12 (ref 9–23)
BUN: 8 mg/dL (ref 6–20)
Bilirubin Total: 0.6 mg/dL (ref 0.0–1.2)
CO2: 22 mmol/L (ref 20–29)
Calcium: 9.3 mg/dL (ref 8.7–10.2)
Chloride: 99 mmol/L (ref 96–106)
Creatinine, Ser: 0.69 mg/dL (ref 0.57–1.00)
Globulin, Total: 2.5 g/dL (ref 1.5–4.5)
Glucose: 134 mg/dL — ABNORMAL HIGH (ref 65–99)
Potassium: 4 mmol/L (ref 3.5–5.2)
Sodium: 138 mmol/L (ref 134–144)
Total Protein: 6.8 g/dL (ref 6.0–8.5)
eGFR: 114 mL/min/{1.73_m2} (ref >59)

## 2020-12-13 ENCOUNTER — Telehealth: Payer: Medicaid Other

## 2021-01-15 ENCOUNTER — Telehealth: Payer: Medicaid Other

## 2021-01-15 ENCOUNTER — Telehealth: Payer: Self-pay

## 2021-01-15 NOTE — Telephone Encounter (Addendum)
  Care Management   Follow Up Note   01/15/2021 Name: [name] MRN: [id number] DOB: [birth year]   Referred by: Minette Brine, FNP Reason for referral : Chronic Care Management (RNCM Follow up call - 1st attempt )   A second unsuccessful telephone outreach was attempted today. The patient was referred to the case management team for assistance with care management and care coordination.   Follow Up Plan: Telephone follow up appointment with care management team member scheduled for: 03/04/21  Barb Merino, RN, BSN, CCM Care Management Coordinator [place] [facility]  Direct Phone: [phone]

## 2021-01-19 ENCOUNTER — Other Ambulatory Visit: Payer: Self-pay | Admitting: Nurse Practitioner

## 2021-01-19 DIAGNOSIS — E1165 Type 2 diabetes mellitus with hyperglycemia: Secondary | ICD-10-CM

## 2021-01-19 DIAGNOSIS — I10 Essential (primary) hypertension: Secondary | ICD-10-CM

## 2021-01-19 DIAGNOSIS — F419 Anxiety disorder, unspecified: Secondary | ICD-10-CM

## 2021-01-19 DIAGNOSIS — R519 Headache, unspecified: Secondary | ICD-10-CM

## 2021-01-30 DIAGNOSIS — H10413 Chronic giant papillary conjunctivitis, bilateral: Secondary | ICD-10-CM | POA: Diagnosis not present

## 2021-01-30 DIAGNOSIS — E119 Type 2 diabetes mellitus without complications: Secondary | ICD-10-CM | POA: Diagnosis not present

## 2021-01-30 DIAGNOSIS — H16223 Keratoconjunctivitis sicca, not specified as Sjogren's, bilateral: Secondary | ICD-10-CM | POA: Diagnosis not present

## 2021-01-30 LAB — HM DIABETES EYE EXAM

## 2021-02-06 ENCOUNTER — Other Ambulatory Visit: Payer: Self-pay

## 2021-02-06 ENCOUNTER — Ambulatory Visit (INDEPENDENT_AMBULATORY_CARE_PROVIDER_SITE_OTHER): Payer: Medicaid Other | Admitting: Podiatry

## 2021-02-06 ENCOUNTER — Encounter: Payer: Self-pay | Admitting: Podiatry

## 2021-02-06 DIAGNOSIS — L84 Corns and callosities: Secondary | ICD-10-CM | POA: Diagnosis not present

## 2021-02-06 DIAGNOSIS — E1165 Type 2 diabetes mellitus with hyperglycemia: Secondary | ICD-10-CM | POA: Diagnosis not present

## 2021-02-09 NOTE — Progress Notes (Signed)
Subjective: [name] is a pleasant 40 y.o. female patient seen today for preventative diabetic foot care with painful calluses b/l great toes. Pain interferes with ambulation. Aggravating factors include wearing enclosed shoe gear. Pain is relieved with periodic professional debridement.  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   PCP is Minette Brine, FNP. Last visit was: 12/05/2020.  Allergies  Allergen Reactions   Other Anaphylaxis and Other (See Comments)    Pt has fish allergy Pt has allergy to butter cookies   Penicillins Rash and Other (See Comments)    Told MD that PCN reaction is rash and that she has taken amoxicillin before   Bactrim [Sulfamethoxazole-Trimethoprim]     Diarrhea, abdominal cramps and rash    Objective: Physical Exam  General: [name] is a pleasant 40 y.o. African American female, morbidly obese in NAD. AAO x 3.   Vascular:  Capillary refill time to digits immediate b/l. Palpable DP pulse(s) b/l lower extremities Palpable PT pulse(s) b/l lower extremities Pedal hair present. Lower extremity skin temperature gradient within normal limits. No edema noted b/l lower extremities.  Dermatological:  Pedal skin with normal turgor, texture and tone b/l lower extremities No open wounds b/l lower extremities No interdigital macerations b/l lower extremities Toenails 1-5 bilaterally well maintained with adequate length. No erythema, no edema, no drainage, no fluctuance. Preulcerative lesion noted L hallux and R hallux. There is visible subdermal hemorrhage. There is no surrounding erythema, no edema, no drainage, no odor, no fluctuance.  Musculoskeletal:  Normal muscle strength 5/5 to all lower extremity muscle groups bilaterally. No pain crepitus or joint limitation noted with ROM b/l. Pes planus deformity noted b/l.   Neurological:  Protective sensation intact 5/5 intact bilaterally with 10g monofilament b/l. Vibratory sensation intact  b/l.  Assessment and Plan:  1. Pre-ulcerative calluses   2. Uncontrolled type 2 diabetes mellitus with hyperglycemia ([place])     -Examined patient. -Medicaid ABN signed for this year. Patient consents for services of paring of preulcerative calluses today. Copy has been placed in patient chart. -Patient to continue soft, supportive shoe gear daily. -Preulcerative lesion pared L hallux and R hallux.  -Patient to report any pedal injuries to medical professional immediately. -Patient/POA to call should there be question/concern in the interim.  Return in about 9 weeks (around 04/10/2021).  Marzetta Board, DPM

## 2021-02-15 ENCOUNTER — Ambulatory Visit: Payer: Medicaid Other | Admitting: Podiatry

## 2021-02-19 ENCOUNTER — Ambulatory Visit: Payer: Medicaid Other | Admitting: Podiatry

## 2021-03-01 ENCOUNTER — Ambulatory Visit: Payer: Medicaid Other | Admitting: Podiatry

## 2021-03-01 ENCOUNTER — Ambulatory Visit: Payer: Medicaid Other | Admitting: Internal Medicine

## 2021-03-04 ENCOUNTER — Encounter: Payer: Self-pay | Admitting: Nurse Practitioner

## 2021-03-04 ENCOUNTER — Telehealth: Payer: Medicaid Other

## 2021-03-04 ENCOUNTER — Telehealth: Payer: Self-pay

## 2021-03-04 NOTE — Telephone Encounter (Signed)
  Care Management   Follow Up Note   03/04/2021 Name: [name] MRN: [id number] DOB: 1981/01/20   Referred by: Minette Brine, FNP Reason for referral : Chronic Care Management (RN CM Follow up call - 3rd attempt )   An unsuccessful telephone outreach was attempted today. The patient was referred to the case management team for assistance with care management and care coordination. I spoke with Ms. Butzer briefly today, unfortunately, she is unavailable to speak with me due to being at work. Ms. Solis is agreeable to call back midweek during her time off work.    Follow Up Plan: Telephone follow up appointment with care management team member scheduled for: 03/06/21  Barb Merino, RN, BSN, CCM Care Management Coordinator [place] [facility]  Direct Phone: [phone]

## 2021-03-06 ENCOUNTER — Ambulatory Visit: Payer: Self-pay

## 2021-03-06 ENCOUNTER — Telehealth: Payer: Medicaid Other

## 2021-03-06 DIAGNOSIS — I10 Essential (primary) hypertension: Secondary | ICD-10-CM

## 2021-03-06 DIAGNOSIS — E782 Mixed hyperlipidemia: Secondary | ICD-10-CM

## 2021-03-06 DIAGNOSIS — E1165 Type 2 diabetes mellitus with hyperglycemia: Secondary | ICD-10-CM

## 2021-03-06 DIAGNOSIS — E66813 Obesity, class 3: Secondary | ICD-10-CM

## 2021-03-06 DIAGNOSIS — Z6841 Body Mass Index (BMI) 40.0 and over, adult: Secondary | ICD-10-CM

## 2021-03-13 NOTE — Patient Instructions (Signed)
Goals Addressed       Other     Hypertension Monitored   On track     Timeframe:  Long-Range Goal Priority:  High Start Date:  03/06/21                           Expected End Date:  03/06/22  Next Scheduled follow up: 04/15/21      Self-Care Activities: Self administers medications as prescribed Attends all scheduled provider appointments Calls provider office for new concerns, questions, or BP outside discussed parameters Checks BP and records as discussed Follows a low sodium diet/DASH diet Patient Goals: - check blood pressure 3 times per week - choose a place to take my blood pressure (home, clinic or office, retail store) - write blood pressure results in a log or diary - learn about high blood pressure                      Monitor and Manage My Blood Sugar-Diabetes Type 2   On track     Timeframe:  Long-Range Goal Priority:  High Start Date:  08/06/20                          Expected End Date:  08/06/21                 Follow Up Date: 04/15/21   - take prescribed medications as directed  - check FBS daily before meals and at bedtime  - adhere to dietary and exercise recommendations as discussed  - follow up with Dr. Kelton Pillar Endocrinologist as directed  - set target A1c    Why is this important?   Checking your blood sugar at home helps to keep it from getting very high or very low.  Writing the results in a diary or log helps the doctor know how to care for you.  Your blood sugar log should have the time, date and the results.  Also, write down the amount of insulin or other medicine that you take.  Other information, like what you ate, exercise done and how you were feeling, will also be helpful.     Notes:       Set My Weight Loss Goal   On track     Timeframe:  Long-Range Goal Priority:  High Start Date:  08/06/20                           Expected End Date: 08/06/21                       Follow Up Date: 04/15/21   - set weight loss goal  - Patient will self  administer medications as prescribed - Patient will attend all scheduled provider appointments - Patient will call provider office for new concerns or questions  Why is this important?   Losing only 5 to 15 percent of your weight makes a big difference in your health.    Notes:

## 2021-03-13 NOTE — Chronic Care Management (AMB) (Addendum)
Care Management   CCM RN Visit Note  03/06/2021 Name: [name] MRN: [id number] DOB: 08-12-1980  Subjective: [name] is a 40 y.o. year old female who is a primary care patient of Minette Brine, [place]. The care management team was consulted for assistance with disease management and care coordination needs.    Engaged with patient by telephone for follow up visit in response to provider referral for case management and/or care coordination services.   Consent to Services:  The patient was given information about Chronic Care Management services, agreed to services, and gave verbal consent prior to initiation of services.  Please see initial visit note for detailed documentation.   Patient agreed to services and verbal consent obtained.   Assessment: Review of patient past medical history, allergies, medications, health status, including review of consultants reports, laboratory and other test data, was performed as part of comprehensive evaluation and provision of chronic care management services.   SDOH (Social Determinants of Health) assessments and interventions performed:  Yes, no acute needs   CCM Care Plan  Allergies  Allergen Reactions   Other Anaphylaxis and Other (See Comments)    Pt has fish allergy Pt has allergy to butter cookies   Penicillins Rash and Other (See Comments)    Told MD that PCN reaction is rash and that she has taken amoxicillin before   Bactrim [Sulfamethoxazole-Trimethoprim]     Diarrhea, abdominal cramps and rash    Outpatient Encounter Medications as of 03/06/2021  Medication Sig Note   albuterol (PROVENTIL) (5 MG/ML) 0.5% nebulizer solution Take 0.5 mLs (2.5 mg total) by nebulization every 6 (six) hours as needed for wheezing or shortness of breath.    albuterol (VENTOLIN HFA) 108 (90 Base) MCG/ACT inhaler Inhale 1-2 puffs into the lungs every 6 (six) hours as needed for wheezing or shortness of breath.    atorvastatin (LIPITOR) 20 MG tablet  Take 1 tablet (20 mg total) by mouth daily.    blood glucose meter kit and supplies KIT Dispense based on patient and insurance preference. Use up to four times daily as directed. (FOR ICD-9 250.00, 250.01).    buPROPion (WELLBUTRIN XL) 300 MG 24 hr tablet TAKE 1 TABLET(300 MG) BY MOUTH AT BEDTIME    cetirizine (ZYRTEC ALLERGY) 10 MG tablet Take 1 tablet (10 mg total) by mouth daily.    dapagliflozin propanediol (FARXIGA) 10 MG TABS tablet Take 1 tablet (10 mg total) by mouth daily.    diclofenac (VOLTAREN) 50 MG EC tablet Take 1 tablet (50 mg total) by mouth 3 (three) times daily.    fluticasone (FLONASE) 50 MCG/ACT nasal spray Place 1-2 sprays into both nostrils daily.    glucose blood test strip USE TO CHECK BLOOD SUGARS UP TO 4 TIMES DAILY  DX CODE:E11.9    ibuprofen (ADVIL) 800 MG tablet Take 800 mg by mouth 3 (three) times daily. 08/30/2020: Was taking this medicine because she needed her tooth pulled    insulin degludec (TRESIBA FLEXTOUCH) 100 UNIT/ML FlexTouch Pen Inject 45 Units into the skin daily.    Insulin Pen Needle 31G X 8 MM MISC 1 Device by Does not apply route daily.    Lancets (ACCU-CHEK MULTICLIX) lancets USE TO CHECK BLOOD SUGARS UP TO 4 TIMES DAILY  DX CODE:E11.9    levonorgestrel (MIRENA) 20 MCG/24HR IUD 1 each by Intrauterine route once.    metFORMIN (GLUCOPHAGE-XR) 750 MG 24 hr tablet TAKE 1 TABLET(750 MG) BY MOUTH IN THE MORNING AND AT BEDTIME  Naltrexone-buPROPion HCl ER 8-90 MG TB12 Take 1 tablet by mouth daily for 1 week then increase to 1 tablet by mouth twice a day x 1 week then increase to 1 tab by mouth am and 2 tabs by mouth pm x 1 week then 2 tabs by mouth twice a day    propranolol (INDERAL) 10 MG tablet TAKE 1 TABLET(10 MG) BY MOUTH EVERY EVENING    Semaglutide, 1 MG/DOSE, (OZEMPIC, 1 MG/DOSE,) 4 MG/3ML SOPN Inject 1 mg into the skin once a week.    triamcinolone (KENALOG) 0.025 % ointment Apply 1 application topically 2 (two) times daily.    No  facility-administered encounter medications on file as of 03/06/2021.    Patient Active Problem List   Diagnosis Date Noted   Dyslipidemia 11/30/2020   Weight gain 11/30/2020   Class 3 severe obesity due to excess calories with serious comorbidity and body mass index (BMI) of 50.0 to 59.9 in adult ([place]) 05/19/2019   Anemia 12/21/2018   Uncontrolled type 2 diabetes mellitus with hyperglycemia ([place]) 12/21/2018   Anxiety 07/19/2018   Depression 07/19/2018   Essential hypertension 07/19/2018   Fibroid uterus 09/17/2015   Menorrhagia 08/27/2015   Type 2 diabetes mellitus without complication, with long-term current use of insulin ([place]) 09/13/2012   BMI 60.0-69.9, adult ([place]) 09/13/2012   Asthma 09/13/2012   History of high blood pressure 09/13/2012   OSA (obstructive sleep apnea) 12/10/2010    Conditions to be addressed/monitored:HTN, DMII, and Mixed Hyperlipidemia, Obesity   Care Plan : Diabetes Type 2 (Adult)  Updates made by Lynne Logan, RN since 03/06/2021 12:00 AM     Problem: Glycemic Management (Diabetes, Type 2)   Priority: High     Long-Range Goal: Glycemic Management Optimized   Start Date: 08/06/2020  Expected End Date: 08/06/2021  Recent Progress: On track  Priority: High  Note:   Objective:  Lab Results  Component Value Date   HGBA1C 10.0 (A) 11/30/2020   Lab Results  Component Value Date   CREATININE 0.69 12/05/2020   CREATININE 0.80 08/28/2020   CREATININE 0.79 05/08/2020   Lab Results  Component Value Date   EGFR 114 12/05/2020   Current Barriers:  Knowledge Deficits related to basic Diabetes pathophysiology and self care/management Knowledge Deficits related to medications used for management of diabetes Case Manager Clinical Goal(s):  patient will demonstrate improved adherence to prescribed treatment plan for diabetes self care/management as evidenced by:  daily monitoring and recording of CBG  adherence to ADA/ carb modified diet exercise 3-5  days/week adherence to prescribed medication regimen Interventions:  03/06/21 completed successful outbound call with patient  Collaboration with Minette Brine FNP regarding development and update of comprehensive plan of care as evidenced by provider attestation and co-signature Inter-disciplinary care team collaboration (see longitudinal plan of care) Provided education to patient about basic DM disease process  Review of patient status, including review of consultants reports, relevant laboratory and other test results, and medications completed Assessed for barriers to prevent patient from adhering to prescribed DM treatment plan Reviewed medications with patient and discussed importance of medication adherence Educated patient on dietary and exercise recommendations; daily glycemic control FBS 80-130, <180 after meals;15'15' rule Advised patient, providing education and rationale, to check cbg daily before meals and at bedtime and record, calling the CCM team and or PCP for findings outside established parameters Mailed printed educational materials related to Diabetes Management  Discussed plans with patient for ongoing care management follow up and provided patient  with direct contact information for care management team Patient Goals/Self-Care Activities  - take prescribed medications as directed  - check FBS daily before meals and at bedtime  - adhere to exercise recommendations as discussed  - follow up with Dr. Kelton Pillar Endocrinologist - set target A1c  Follow Up Plan: Telephone follow up appointment with care management team member scheduled for: 04/15/21    Problem: Disease Progression (Diabetes, Type 2) Resolved 03/06/2021  Priority: High     Long-Range Goal: Disease Progression Prevented or Minimized Completed 03/06/2021  Start Date: 08/06/2020  Expected End Date: 02/04/2021  Recent Progress: On track  Priority: High  Note:   Objective:  Lab Results  Component Value Date    HGBA1C 10.1 (H) 05/08/2020   Lab Results  Component Value Date   CREATININE 0.79 05/08/2020   CREATININE 0.80 02/29/2020   CREATININE 0.91 11/30/2019   No results found for: EGFR Current Barriers:  Knowledge Deficits related to basic Diabetes pathophysiology and self care/management Knowledge Deficits related to medications used for management of diabetes Case Manager Clinical Goal(s):  Over the next 180 days, patient will demonstrate improved adherence to prescribed treatment plan for diabetes self care/management as evidenced by:  daily monitoring and recording of CBG  adherence to ADA/ carb modified diet exercise 3-5 days/week adherence to prescribed medication regimen Interventions:  Provided education to patient about basic DM disease process Reviewed medications with patient and discussed importance of medication adherence Discussed plans with patient for ongoing care management follow up and provided patient with direct contact information for care management team Provided patient with written educational materials related to hypo and hyperglycemia and importance of correct treatment Review of patient status, including review of consultants reports, relevant laboratory and other test results, and medications completed. Patient Goals/Self-Care Activities Over the next 180 days, patient will:  - Self administers oral medications as prescribed - Checks blood sugars as prescribed and utilize hyper and hypoglycemia protocol as needed - Adheres to prescribed ADA/carb modified - check blood sugar at prescribed times - check blood sugar if I feel it is too high or too low - take the blood sugar log to all doctor visits - take the blood sugar meter to all doctor visits  - follow dietary and exercise recommendations     Care Plan : Obesity (Adult)  Updates made by Lynne Logan, RN since 03/06/2021 12:00 AM     Problem: Readiness for Weight Management   Priority: High      Long-Range Goal: Plan for Weight Management Developed   Start Date: 08/06/2020  Expected End Date: 08/06/2021  Recent Progress: On track  Priority: High  Note:   Current Barriers:  Knowledge Deficits related to weight loss strategies as evidenced by weight gain reported/observed and/or altered eating habits and response or side effect of medical treatment or medication  Chronic Disease Management support and education needs related to weight management in patient with HTN, DM, Depression, and Obesity  Cannot afford weight loss drug  Knowledge Deficits related to weight loss management  Chronic Disease Management support and education needs related to DM, HTN, Obesity, Depression  Financial Constraints.  Nurse Case Manager Clinical Goal(s):  Patient will demonstrate improved health management independence as evidenced by adhering to provider prescribed dietary guidelines (specify), increasing daily water intake (patient does not have provider prescribed fluid restriction), and starting Contrave as directed by PCP  Interventions:  03/06/21 completed successful outbound call with patient  Collaboration with Minette Brine FNP regarding development  and update of comprehensive plan of care as evidenced by provider attestation and co-signature Inter-disciplinary care team collaboration (see longitudinal plan of care) Evaluation of current treatment plan related to weight loss and patient's adherence to plan as established by provider Wt Readings from Last 3 Encounters: 12/05/20 : (!) 350 lb 12.8 oz (159.1 kg) 11/30/20 : (!) 353 lb 4 oz (160.2 kg) 08/28/20 : (!) 358 lb 6.4 oz (162.6 kg) Provided verbal education to patient re: recommended life style changes: avoid fad diets, make small/incremental dietary and exercise changes, eat at the table and avoid eating in front of the TV, plan management of cravings, monitor snacking and cravings in food diary Mailed printed educational material related to How  to get started with Losing Weight  Discussed plans with patient for ongoing care management follow up and provided patient with direct contact information for care management team:  Patient Goals/Self-Care Activities - Patient will self administer medications as prescribed - Patient will attend all scheduled provider appointments - Patient will call provider office for new concerns or questions - set weight loss goal   Follow Up Plan: Telephone follow up appointment with care management team member scheduled for:04/15/21    Care Plan : Hypertension (Adult)  Updates made by Lynne Logan, RN since 03/06/2021 12:00 AM     Problem: Hypertension (Hypertension)   Priority: High     Long-Range Goal: Hypertension Monitored   Start Date: 03/06/2021  Expected End Date: 03/06/2022  This Visit's Progress: On track  Priority: High  Note:   Objective:  Last practice recorded BP readings:  BP Readings from Last 3 Encounters:  12/05/20 138/90  11/30/20 (!) 140/92  08/28/20 128/80   Most recent eGFR/CrCl:  Lab Results  Component Value Date   EGFR 114 12/05/2020    No components found for: CRCL Current Barriers:  Knowledge Deficits related to basic understanding of hypertension pathophysiology and self care management Knowledge Deficits related to understanding of medications prescribed for management of hypertension Case Manager Clinical Goal(s):  patient will demonstrate improved adherence to prescribed treatment plan for hypertension as evidenced by taking all medications as prescribed, monitoring and recording blood pressure as directed, adhering to low sodium/DASH diet Interventions:  03/06/21 completed successful outbound call with patient  Collaboration with Minette Brine, FNP regarding development and update of comprehensive plan of care as evidenced by provider attestation and co-signature Inter-disciplinary care team collaboration (see longitudinal plan of care) Evaluation of current  treatment plan related to hypertension self management and patient's adherence to plan as established by provider. Provided education to patient re: stroke prevention, s/s of heart attack and stroke, DASH diet, complications of uncontrolled blood pressure Reviewed medications with patient and discussed importance of compliance Advised patient, providing education and rationale, to monitor blood pressure daily and record, calling PCP for findings outside established parameters.  Reviewed scheduled/upcoming provider appointments including: PCP follow up scheduled for 04/11/21 _0 :26 PM  Discussed plans with patient for ongoing care management follow up and provided patient with direct contact information for care management team Self-Care Activities: Self administers medications as prescribed Attends all scheduled provider appointments Calls provider office for new concerns, questions, or BP outside discussed parameters Checks BP and records as discussed Follows a low sodium diet/DASH diet Patient Goals: - check blood pressure 3 times per week - choose a place to take my blood pressure (home, clinic or office, retail store) - write blood pressure results in a log or diary - learn about high blood  pressure  Follow Up Plan: Telephone follow up appointment with care management team member scheduled for: 04/15/21      Plan:Telephone follow up appointment with care management team member scheduled for:  04/15/21  Barb Merino, RN, BSN, CCM Care Management Coordinator [place] [facility]  Direct Phone: [phone]

## 2021-03-18 ENCOUNTER — Ambulatory Visit: Payer: Medicaid Other | Admitting: Podiatry

## 2021-03-22 ENCOUNTER — Other Ambulatory Visit: Payer: Self-pay

## 2021-03-22 ENCOUNTER — Encounter: Payer: Self-pay | Admitting: Podiatry

## 2021-03-22 ENCOUNTER — Ambulatory Visit: Payer: Medicaid Other | Admitting: Podiatry

## 2021-03-22 DIAGNOSIS — L84 Corns and callosities: Secondary | ICD-10-CM

## 2021-03-22 DIAGNOSIS — E1165 Type 2 diabetes mellitus with hyperglycemia: Secondary | ICD-10-CM | POA: Diagnosis not present

## 2021-03-24 NOTE — Progress Notes (Signed)
Subjective: [name] is a pleasant 40 y.o. female patient seen today for preventative diabetic foot care and preulcerative {corn(s), callus(es) L hallux and R hallux.   She is being followed by Dr. Kelton Bryant in Endocrinology to get her blood sugar under control.  PCP is Stephanie Brine, FNP. Last visit was: 12/05/2020.  Allergies  Allergen Reactions   Other Anaphylaxis and Other (See Comments)    Pt has fish allergy Pt has allergy to butter cookies   Penicillins Rash and Other (See Comments)    Told MD that PCN reaction is rash and that she has taken amoxicillin before   Bactrim [Sulfamethoxazole-Trimethoprim]     Diarrhea, abdominal cramps and rash    Objective: Physical Exam  General: [name] is a pleasant 62 y.o. African American female, morbidly obese in NAD. AAO x 3.   Vascular:  Capillary refill time to digits immediate b/l. Palpable pedal pulses b/l LE. Pedal hair present. Lower extremity skin temperature gradient within normal limits. No edema noted b/l lower extremities.  Dermatological:  Skin warm and supple b/l lower extremities. No open wounds b/l lower extremities. Toenails 1-5 bilaterally well maintained with adequate length. No erythema, no edema, no drainage, no fluctuance. Preulcerative lesion noted L hallux and R hallux. There is visible subdermal hemorrhage. There is no surrounding erythema, no edema, no drainage, no odor, no fluctuance.  Musculoskeletal:  Normal muscle strength 5/5 to all lower extremity muscle groups bilaterally. Palpable exostosis at hallux IPJ b/l. Pes planus deformity noted b/l lower extremities.  Neurological:  Protective sensation intact 5/5 intact bilaterally with 10g monofilament b/l. Vibratory sensation intact b/l. Proprioception intact bilaterally.  Assessment and Plan:  1. Pre-ulcerative calluses   2. Uncontrolled type 2 diabetes mellitus with hyperglycemia ([name])   -Examined patient. -Patient to continue soft,  supportive shoe gear daily. -Preulcerative lesion pared L hallux and R hallux.  -Patient to report any pedal injuries to medical professional immediately. -Patient/POA to call should there be question/concern in the interim.  Return in about 9 weeks (around 05/24/2021) for Pre-Ulcerative Callus in diabetic.[name], DPM

## 2021-04-11 ENCOUNTER — Encounter: Payer: Medicaid Other | Admitting: Nurse Practitioner

## 2021-04-11 NOTE — Progress Notes (Deleted)
I,Eduard Penkala [employer] as a Education administrator for [employer], FNP.,have documented all relevant documentation on the behalf of Minette Brine, FNP,as directed by  Minette Brine, FNP while in the presence of Minette Brine, [place].  This visit occurred during the SARS-CoV-2 public health emergency.  Safety protocols were in place, including screening questions prior to the visit, additional usage of staff PPE, and extensive cleaning of exam room while observing appropriate contact time as indicated for disinfecting solutions.  Subjective:     Patient ID: [name] , female    DOB: 09-Aug-1980 , 40 y.o.   MRN: [id number]   Chief Complaint  Patient presents with   Annual Exam    HPI  Patient here for hm     Past Medical History:  Diagnosis Date   Abnormal Pap smear    f/u wnl   Asthma    Fibroid    Gestational diabetes    Hypertension    Morbid obesity ([place])    Sleep apnea    Urinary tract infection      Family History  Problem Relation Age of Onset   Diabetes Father    Allergies Mother    Asthma Mother    Hypertension Mother    Diabetes Mother    Diabetes Maternal Grandmother    Heart disease Maternal Grandmother    Anesthesia problems Neg Hx    Hypotension Neg Hx    Malignant hyperthermia Neg Hx    Pseudochol deficiency Neg Hx    Hearing loss Neg Hx      Current Outpatient Medications:    albuterol (PROVENTIL) (5 MG/ML) 0.5% nebulizer solution, Take 0.5 mLs (2.5 mg total) by nebulization every 6 (six) hours as needed for wheezing or shortness of breath., Disp: 20 mL, Rfl: 12   albuterol (VENTOLIN HFA) 108 (90 Base) MCG/ACT inhaler, Inhale 1-2 puffs into the lungs every 6 (six) hours as needed for wheezing or shortness of breath., Disp: 18 g, Rfl: 0   atorvastatin (LIPITOR) 20 MG tablet, Take 1 tablet (20 mg total) by mouth daily., Disp: 90 tablet, Rfl: 1   blood glucose meter kit and supplies KIT, Dispense based on patient and insurance preference. Use up to four times  daily as directed. (FOR ICD-9 250.00, 250.01)., Disp: 1 each, Rfl: 0   buPROPion (WELLBUTRIN XL) 300 MG 24 hr tablet, TAKE 1 TABLET(300 MG) BY MOUTH AT BEDTIME, Disp: 90 tablet, Rfl: 1   cetirizine (ZYRTEC ALLERGY) 10 MG tablet, Take 1 tablet (10 mg total) by mouth daily., Disp: 90 tablet, Rfl: 0   dapagliflozin propanediol (FARXIGA) 10 MG TABS tablet, Take 1 tablet (10 mg total) by mouth daily., Disp: 90 tablet, Rfl: 1   diclofenac (VOLTAREN) 50 MG EC tablet, Take 1 tablet (50 mg total) by mouth 3 (three) times daily., Disp: 45 tablet, Rfl: 3   ferrous sulfate 325 (65 FE) MG EC tablet, Take 1 tablet (325 mg total) by mouth 3 (three) times daily with meals., Disp: 90 tablet, Rfl: 2   fluticasone (FLONASE) 50 MCG/ACT nasal spray, Place 1-2 sprays into both nostrils daily., Disp: 16 g, Rfl: 0   glucose blood test strip, USE TO CHECK BLOOD SUGARS UP TO 4 TIMES DAILY  DX CODE:E11.9, Disp: 100 each, Rfl: 12   ibuprofen (ADVIL) 800 MG tablet, Take 800 mg by mouth 3 (three) times daily., Disp: , Rfl:    insulin degludec (TRESIBA FLEXTOUCH) 100 UNIT/ML FlexTouch Pen, Inject 45 Units into the skin daily., Disp: 45 mL, Rfl:  2   Insulin Pen Needle 31G X 8 MM MISC, 1 Device by Does not apply route daily., Disp: 100 each, Rfl: 1   Lancets (ACCU-CHEK MULTICLIX) lancets, USE TO CHECK BLOOD SUGARS UP TO 4 TIMES DAILY  DX CODE:E11.9, Disp: 100 each, Rfl: 12   levonorgestrel (MIRENA) 20 MCG/24HR IUD, 1 each by Intrauterine route once., Disp: , Rfl:    losartan-hydrochlorothiazide (HYZAAR) 50-12.5 MG tablet, Take 1 tablet by mouth daily., Disp: 90 tablet, Rfl: 1   metFORMIN (GLUCOPHAGE-XR) 750 MG 24 hr tablet, TAKE 1 TABLET(750 MG) BY MOUTH IN THE MORNING AND AT BEDTIME, Disp: 180 tablet, Rfl: 1   Naltrexone-buPROPion HCl ER 8-90 MG TB12, Take 1 tablet by mouth daily for 1 week then increase to 1 tablet by mouth twice a day x 1 week then increase to 1 tab by mouth am and 2 tabs by mouth pm x 1 week then 2 tabs by mouth  twice a day, Disp: 120 tablet, Rfl: 1   propranolol (INDERAL) 10 MG tablet, TAKE 1 TABLET(10 MG) BY MOUTH EVERY EVENING, Disp: 90 tablet, Rfl: 1   Semaglutide, 1 MG/DOSE, (OZEMPIC, 1 MG/DOSE,) 4 MG/3ML SOPN, Inject 1 mg into the skin once a week., Disp: 9 mL, Rfl: 1   triamcinolone (KENALOG) 0.025 % ointment, Apply 1 application topically 2 (two) times daily., Disp: 30 g, Rfl: 0   Allergies  Allergen Reactions   Other Anaphylaxis and Other (See Comments)    Pt has fish allergy Pt has allergy to butter cookies   Penicillins Rash and Other (See Comments)    Told MD that PCN reaction is rash and that she has taken amoxicillin before   Bactrim [Sulfamethoxazole-Trimethoprim]     Diarrhea, abdominal cramps and rash      The patient states she uses {contraceptive methods:5051} for birth control. Last LMP was No LMP recorded. (Menstrual status: IUD).. {Dysmenorrhea-menorrhagia:21918}. Negative for: breast discharge, breast lump(s), breast pain and breast self exam. Associated symptoms include abnormal vaginal bleeding. Pertinent negatives include abnormal bleeding (hematology), anxiety, decreased libido, depression, difficulty falling sleep, dyspareunia, history of infertility, nocturia, sexual dysfunction, sleep disturbances, urinary incontinence, urinary urgency, vaginal discharge and vaginal itching. Diet regular.The patient states her exercise level is    . The patient's tobacco use is:  Social History   Tobacco Use  Smoking Status Former   Types: Cigarettes   Quit date: 08/08/2019   Years since quitting: 1.6  Smokeless Tobacco Never  . She has been exposed to passive smoke. The patient's alcohol use is:  Social History   Substance and Sexual Activity  Alcohol Use No   Comment: occasional  . Additional information: Last pap ***, next one scheduled for ***.    Review of Systems  Constitutional: Negative.   HENT: Negative.    Eyes: Negative.   Respiratory: Negative.     Cardiovascular: Negative.   Gastrointestinal: Negative.   Endocrine: Negative.   Genitourinary: Negative.   Musculoskeletal: Negative.   Skin: Negative.   Allergic/Immunologic: Negative.   Neurological: Negative.   Hematological: Negative.   Psychiatric/Behavioral: Negative.      There were no vitals filed for this visit. There is no height or weight on file to calculate BMI.   Objective:  Physical Exam      Assessment And Plan:     1. Encounter for general adult medical examination w/o abnormal findings  2. Essential hypertension  3. Uncontrolled type 2 diabetes mellitus with hyperglycemia ([place])  4. Mixed hyperlipidemia  Patient was given opportunity to ask questions. Patient verbalized understanding of the plan and was able to repeat key elements of the plan. All questions were answered to their satisfaction.   [address] [place], CMA   I, [place], [place], have reviewed all documentation for this visit. The documentation on 04/11/21 for the exam, diagnosis, procedures, and orders are all accurate and complete.  THE PATIENT IS ENCOURAGED TO PRACTICE SOCIAL DISTANCING DUE TO THE COVID-19 PANDEMIC.

## 2021-04-15 ENCOUNTER — Telehealth: Payer: Medicaid Other

## 2021-04-18 ENCOUNTER — Encounter: Payer: Self-pay | Admitting: Nurse Practitioner

## 2021-04-18 ENCOUNTER — Other Ambulatory Visit: Payer: Self-pay

## 2021-04-18 ENCOUNTER — Ambulatory Visit (INDEPENDENT_AMBULATORY_CARE_PROVIDER_SITE_OTHER): Payer: Medicaid Other | Admitting: Nurse Practitioner

## 2021-04-18 VITALS — BP 124/86 | HR 96 | Temp 99.2°F | Ht 65.0 in | Wt 332.6 lb

## 2021-04-18 DIAGNOSIS — R0683 Snoring: Secondary | ICD-10-CM | POA: Diagnosis not present

## 2021-04-18 DIAGNOSIS — E559 Vitamin D deficiency, unspecified: Secondary | ICD-10-CM

## 2021-04-18 DIAGNOSIS — Z6841 Body Mass Index (BMI) 40.0 and over, adult: Secondary | ICD-10-CM

## 2021-04-18 DIAGNOSIS — N390 Urinary tract infection, site not specified: Secondary | ICD-10-CM | POA: Diagnosis not present

## 2021-04-18 DIAGNOSIS — F32A Depression, unspecified: Secondary | ICD-10-CM

## 2021-04-18 DIAGNOSIS — R21 Rash and other nonspecific skin eruption: Secondary | ICD-10-CM | POA: Diagnosis not present

## 2021-04-18 DIAGNOSIS — R9431 Abnormal electrocardiogram [ECG] [EKG]: Secondary | ICD-10-CM

## 2021-04-18 DIAGNOSIS — Z0000 Encounter for general adult medical examination without abnormal findings: Secondary | ICD-10-CM | POA: Diagnosis not present

## 2021-04-18 DIAGNOSIS — E1165 Type 2 diabetes mellitus with hyperglycemia: Secondary | ICD-10-CM

## 2021-04-18 DIAGNOSIS — Z23 Encounter for immunization: Secondary | ICD-10-CM | POA: Diagnosis not present

## 2021-04-18 DIAGNOSIS — F419 Anxiety disorder, unspecified: Secondary | ICD-10-CM

## 2021-04-18 DIAGNOSIS — I10 Essential (primary) hypertension: Secondary | ICD-10-CM | POA: Diagnosis not present

## 2021-04-18 DIAGNOSIS — R319 Hematuria, unspecified: Secondary | ICD-10-CM

## 2021-04-18 DIAGNOSIS — T7840XD Allergy, unspecified, subsequent encounter: Secondary | ICD-10-CM

## 2021-04-18 DIAGNOSIS — E782 Mixed hyperlipidemia: Secondary | ICD-10-CM

## 2021-04-18 LAB — POCT URINALYSIS DIPSTICK
Bilirubin, UA: NEGATIVE
Glucose, UA: NEGATIVE
Ketones, UA: NEGATIVE
Leukocytes, UA: NEGATIVE
Nitrite, UA: POSITIVE
Protein, UA: NEGATIVE
Spec Grav, UA: 1.025 (ref 1.010–1.025)
Urobilinogen, UA: NEGATIVE E.U./dL — AB
pH, UA: 6 (ref 5.0–8.0)

## 2021-04-18 LAB — POCT UA - MICROALBUMIN
Albumin/Creatinine Ratio, Urine, POC: <30
Creatinine, POC: 300 mg/dL
Microalbumin Ur, POC: 30 mg/L

## 2021-04-18 MED ORDER — FLUTICASONE PROPIONATE 50 MCG/ACT NA SUSP: 1.0000 | Freq: Every day | NASAL | 0 refills | Status: DC

## 2021-04-18 MED ORDER — OZEMPIC (1 MG/DOSE) 4 MG/3ML ~~LOC~~ SOPN: 1.0000 mg | PEN_INJECTOR | SUBCUTANEOUS | 1 refills | Status: DC

## 2021-04-18 MED ORDER — BUPROPION HCL ER (XL) 300 MG PO TB24: ORAL_TABLET | ORAL | 1 refills | Status: DC

## 2021-04-18 MED ORDER — TRIAMCINOLONE ACETONIDE 0.025 % EX OINT: 1.0000 "application " | TOPICAL_OINTMENT | Freq: Two times a day (BID) | CUTANEOUS | 0 refills | Status: DC

## 2021-04-18 MED ORDER — TRESIBA FLEXTOUCH 100 UNIT/ML ~~LOC~~ SOPN: 45.0000 [IU] | PEN_INJECTOR | Freq: Every day | SUBCUTANEOUS | 2 refills | Status: DC

## 2021-04-18 MED ORDER — NITROFURANTOIN MONOHYD MACRO 100 MG PO CAPS: 100.0000 mg | ORAL_CAPSULE | Freq: Two times a day (BID) | ORAL | 0 refills | Status: AC

## 2021-04-18 NOTE — Patient Instructions (Signed)
Health Maintenance, Female Adopting a healthy lifestyle and getting preventive care are important in promoting health and wellness. Ask your health care provider about: The right schedule for you to have regular tests and exams. Things you can do on your own to prevent diseases and keep yourself healthy. What should I know about diet, weight, and exercise? Eat a healthy diet  Eat a diet that includes plenty of vegetables, fruits, low-fat dairy products, and lean protein. Do not eat a lot of foods that are high in solid fats, added sugars, or sodium. Maintain a healthy weight Body mass index (BMI) is used to identify weight problems. It estimates body fat based on height and weight. Your health care provider can help determine your BMI and help you achieve or maintain a healthy weight. Get regular exercise Get regular exercise. This is one of the most important things you can do for your health. Most adults should: Exercise for at least 150 minutes each week. The exercise should increase your heart rate and make you sweat (moderate-intensity exercise). Do strengthening exercises at least twice a week. This is in addition to the moderate-intensity exercise. Spend less time sitting. Even light physical activity can be beneficial. Watch cholesterol and blood lipids Have your blood tested for lipids and cholesterol at 39 years of age, then have this test every 5 years. Have your cholesterol levels checked more often if: Your lipid or cholesterol levels are high. You are older than 40 years of age. You are at high risk for heart disease. What should I know about cancer screening? Depending on your health history and family history, you may need to have cancer screening at various ages. This may include screening for: Breast cancer. Cervical cancer. Colorectal cancer. Skin cancer. Lung cancer. What should I know about heart disease, diabetes, and high blood pressure? Blood pressure and heart  disease High blood pressure causes heart disease and increases the risk of stroke. This is more likely to develop in people who have high blood pressure readings, are of African descent, or are overweight. Have your blood pressure checked: Every 3-5 years if you are 18-39 years of age. Every year if you are 40 years old or older. Diabetes Have regular diabetes screenings. This checks your fasting blood sugar level. Have the screening done: Once every three years after age 40 if you are at a normal weight and have a low risk for diabetes. More often and at a younger age if you are overweight or have a high risk for diabetes. What should I know about preventing infection? Hepatitis B If you have a higher risk for hepatitis B, you should be screened for this virus. Talk with your health care provider to find out if you are at risk for hepatitis B infection. Hepatitis C Testing is recommended for: Everyone born from 1945 through 1965. Anyone with known risk factors for hepatitis C. Sexually transmitted infections (STIs) Get screened for STIs, including gonorrhea and chlamydia, if: You are sexually active and are younger than 39 years of age. You are older than 39 years of age and your health care provider tells you that you are at risk for this type of infection. Your sexual activity has changed since you were last screened, and you are at increased risk for chlamydia or gonorrhea. Ask your health care provider if you are at risk. Ask your health care provider about whether you are at high risk for HIV. Your health care provider may recommend a prescription medicine   to help prevent HIV infection. If you choose to take medicine to prevent HIV, you should first get tested for HIV. You should then be tested every 3 months for as long as you are taking the medicine. Pregnancy If you are about to stop having your period (premenopausal) and you may become pregnant, seek counseling before you get  pregnant. Take 400 to 800 micrograms (mcg) of folic acid every day if you become pregnant. Ask for birth control (contraception) if you want to prevent pregnancy. Osteoporosis and menopause Osteoporosis is a disease in which the bones lose minerals and strength with aging. This can result in bone fractures. If you are 65 years old or older, or if you are at risk for osteoporosis and fractures, ask your health care provider if you should: Be screened for bone loss. Take a calcium or vitamin D supplement to lower your risk of fractures. Be given hormone replacement therapy (HRT) to treat symptoms of menopause. Follow these instructions at home: Lifestyle Do not use any products that contain nicotine or tobacco, such as cigarettes, e-cigarettes, and chewing tobacco. If you need help quitting, ask your health care provider. Do not use street drugs. Do not share needles. Ask your health care provider for help if you need support or information about quitting drugs. Alcohol use Do not drink alcohol if: Your health care provider tells you not to drink. You are pregnant, may be pregnant, or are planning to become pregnant. If you drink alcohol: Limit how much you use to 0-1 drink a day. Limit intake if you are breastfeeding. Be aware of how much alcohol is in your drink. In the [place] one drink equals one 12 oz bottle of beer (355 mL), one 5 oz glass of wine (148 mL), or one 1 oz glass of hard liquor (44 mL). General instructions Schedule regular health, dental, and eye exams. Stay current with your vaccines. Tell your health care provider if: You often feel depressed. You have ever been abused or do not feel safe at home. Summary Adopting a healthy lifestyle and getting preventive care are important in promoting health and wellness. Follow your health care provider's instructions about healthy diet, exercising, and getting tested or screened for diseases. Follow your health care provider's  instructions on monitoring your cholesterol and blood pressure. This information is not intended to replace advice given to you by your health care provider. Make sure you discuss any questions you have with your health care provider. Document Revised: 09/28/2020 Document Reviewed: 07/14/2018 Elsevier Patient Education  2022 [employer]  

## 2021-04-18 NOTE — Progress Notes (Addendum)
I,Katawbba Wiggins,acting as a Education administrator for [employer], NP.,have documented all relevant documentation on the behalf of [employer], NP,as directed by  Bary Castilla, NP while in the presence of Bary Castilla, NP.  This visit occurred during the SARS-CoV-2 public health emergency.  Safety protocols were in place, including screening questions prior to the visit, additional usage of staff PPE, and extensive cleaning of exam room while observing appropriate contact time as indicated for disinfecting solutions.  Subjective:     Patient ID: [name] , female    DOB: 07-22-1981 , 40 y.o.   MRN: [id number]   Chief Complaint  Patient presents with   Annual Exam     HPI  The patient is here today for a physical examination.  Patient is followed by Loma Boston, OD for her GYN care.  Last pap was Nov. 2020. She has been though a lot of stress due to her work and also being a single mom. She is out of a lot of her medication and would like to get some refills. She also sees the endocrinologist.  She has lost some weight for which she was congratulated on.  Wt Readings from Last 3 Encounters: 04/18/21 : (!) 332 lb 9.6 oz (150.9 kg) 12/05/20 : (!) 350 lb 12.8 oz (159.1 kg) 11/30/20 : (!) 353 lb 4 oz (160.2 kg)      Past Medical History:  Diagnosis Date   Abnormal Pap smear    f/u wnl   Asthma    Fibroid    Gestational diabetes    Hypertension    Morbid obesity ([place])    Sleep apnea    Urinary tract infection      Family History  Problem Relation Age of Onset   Diabetes Father    Allergies Mother    Asthma Mother    Hypertension Mother    Diabetes Mother    Diabetes Maternal Grandmother    Heart disease Maternal Grandmother    Anesthesia problems Neg Hx    Hypotension Neg Hx    Malignant hyperthermia Neg Hx    Pseudochol deficiency Neg Hx    Hearing loss Neg Hx      Current Outpatient Medications:    albuterol (PROVENTIL) (5 MG/ML) 0.5% nebulizer  solution, Take 0.5 mLs (2.5 mg total) by nebulization every 6 (six) hours as needed for wheezing or shortness of breath., Disp: 20 mL, Rfl: 12   albuterol (VENTOLIN HFA) 108 (90 Base) MCG/ACT inhaler, Inhale 1-2 puffs into the lungs every 6 (six) hours as needed for wheezing or shortness of breath., Disp: 18 g, Rfl: 0   atorvastatin (LIPITOR) 20 MG tablet, Take 1 tablet (20 mg total) by mouth daily., Disp: 90 tablet, Rfl: 1   blood glucose meter kit and supplies KIT, Dispense based on patient and insurance preference. Use up to four times daily as directed. (FOR ICD-9 250.00, 250.01)., Disp: 1 each, Rfl: 0   cetirizine (ZYRTEC ALLERGY) 10 MG tablet, Take 1 tablet (10 mg total) by mouth daily., Disp: 90 tablet, Rfl: 0   dapagliflozin propanediol (FARXIGA) 10 MG TABS tablet, Take 1 tablet (10 mg total) by mouth daily., Disp: 90 tablet, Rfl: 1   diclofenac (VOLTAREN) 50 MG EC tablet, Take 1 tablet (50 mg total) by mouth 3 (three) times daily., Disp: 45 tablet, Rfl: 3   ferrous sulfate 325 (65 FE) MG EC tablet, Take 1 tablet (325 mg total) by mouth 3 (three) times daily with meals., Disp: 90 tablet, Rfl:  2   glucose blood test strip, USE TO CHECK BLOOD SUGARS UP TO 4 TIMES DAILY  DX CODE:E11.9, Disp: 100 each, Rfl: 12   ibuprofen (ADVIL) 800 MG tablet, Take 800 mg by mouth 3 (three) times daily., Disp: , Rfl:    Lancets (ACCU-CHEK MULTICLIX) lancets, USE TO CHECK BLOOD SUGARS UP TO 4 TIMES DAILY  DX CODE:E11.9, Disp: 100 each, Rfl: 12   levonorgestrel (MIRENA) 20 MCG/24HR IUD, 1 each by Intrauterine route once., Disp: , Rfl:    metFORMIN (GLUCOPHAGE-XR) 750 MG 24 hr tablet, TAKE 1 TABLET(750 MG) BY MOUTH IN THE MORNING AND AT BEDTIME, Disp: 180 tablet, Rfl: 1   propranolol (INDERAL) 10 MG tablet, TAKE 1 TABLET(10 MG) BY MOUTH EVERY EVENING, Disp: 90 tablet, Rfl: 1   buPROPion (WELLBUTRIN XL) 300 MG 24 hr tablet, TAKE 1 TABLET(300 MG) BY MOUTH DAILY, Disp: 90 tablet, Rfl: 1   fluticasone (FLONASE) 50  MCG/ACT nasal spray, Place 1-2 sprays into both nostrils daily., Disp: 16 g, Rfl: 0   insulin degludec (TRESIBA FLEXTOUCH) 100 UNIT/ML FlexTouch Pen, Inject 45 Units into the skin daily., Disp: 45 mL, Rfl: 2   Insulin Pen Needle 31G X 8 MM MISC, 1 Device by Does not apply route daily., Disp: 100 each, Rfl: 1   losartan-hydrochlorothiazide (HYZAAR) 50-12.5 MG tablet, Take 1 tablet by mouth daily., Disp: 90 tablet, Rfl: 1   Semaglutide, 1 MG/DOSE, (OZEMPIC, 1 MG/DOSE,) 4 MG/3ML SOPN, Inject 1 mg into the skin once a week., Disp: 9 mL, Rfl: 1   triamcinolone (KENALOG) 0.025 % ointment, Apply 1 application topically 2 (two) times daily., Disp: 30 g, Rfl: 0   Allergies  Allergen Reactions   Other Anaphylaxis and Other (See Comments)    Pt has fish allergy Pt has allergy to butter cookies   Penicillins Rash and Other (See Comments)    Told MD that PCN reaction is rash and that she has taken amoxicillin before   Bactrim [Sulfamethoxazole-Trimethoprim]     Diarrhea, abdominal cramps and rash      The patient states she uses none for birth control. Last LMP was No LMP recorded. (Menstrual status: IUD).. Negative for Dysmenorrhea. Negative for: breast discharge, breast lump(s), breast pain and breast self exam. Associated symptoms include abnormal vaginal bleeding. Pertinent negatives include abnormal bleeding (hematology), anxiety, decreased libido, depression, difficulty falling sleep, dyspareunia, history of infertility, nocturia, sexual dysfunction, sleep disturbances, urinary incontinence, urinary urgency, vaginal discharge and vaginal itching. Diet regular.The patient states her exercise level is    . The patient's tobacco use is:  Social History   Tobacco Use  Smoking Status Former   Types: Cigarettes   Quit date: 08/08/2019   Years since quitting: 1.6  Smokeless Tobacco Never  . She has been exposed to passive smoke. The patient's alcohol use is:  Social History   Substance and Sexual  Activity  Alcohol Use No   Comment: occasional  . Additional information: Last pap 2020, next one scheduled for 2023.    Review of Systems  Constitutional: Negative.  Negative for chills and fever.  HENT: Negative.  Negative for congestion, postnasal drip, rhinorrhea and sinus pressure.   Eyes: Negative.   Respiratory: Negative.  Negative for cough, shortness of breath and wheezing.   Cardiovascular: Negative.  Negative for chest pain and palpitations.  Gastrointestinal: Negative.  Negative for constipation and nausea.  Endocrine: Negative.   Genitourinary: Negative.   Musculoskeletal: Negative.  Negative for arthralgias and myalgias.  Skin: Negative.  Allergic/Immunologic: Negative.   Neurological: Negative.  Negative for dizziness, weakness, numbness and headaches.  Hematological: Negative.   Psychiatric/Behavioral:  Positive for sleep disturbance.     Today's Vitals   04/18/21 1055  BP: 124/86  Pulse: 96  Temp: 99.2 F (37.3 C)  TempSrc: Oral  Weight: (!) 332 lb 9.6 oz (150.9 kg)  Height: '5\' 5"'  (1.651 m)   Body mass index is 55.35 kg/m.  Wt Readings from Last 3 Encounters:  04/18/21 (!) 332 lb 9.6 oz (150.9 kg)  12/05/20 (!) 350 lb 12.8 oz (159.1 kg)  11/30/20 (!) 353 lb 4 oz (160.2 kg)    BP Readings from Last 3 Encounters:  04/18/21 124/86  12/05/20 138/90  11/30/20 (!) 140/92    Objective:  Physical Exam Vitals and nursing note reviewed.  Constitutional:      Appearance: Normal appearance. She is obese.  HENT:     Head: Normocephalic and atraumatic.     Right Ear: Tympanic membrane, ear canal and external ear normal. There is no impacted cerumen.     Left Ear: Tympanic membrane, ear canal and external ear normal. There is no impacted cerumen.     Nose: Nose normal. No congestion or rhinorrhea.     Mouth/Throat:     Mouth: Mucous membranes are moist.     Pharynx: Oropharynx is clear. No oropharyngeal exudate or posterior oropharyngeal erythema.  Eyes:      Extraocular Movements: Extraocular movements intact.     Conjunctiva/sclera: Conjunctivae normal.     Pupils: Pupils are equal, round, and reactive to light.  Cardiovascular:     Rate and Rhythm: Normal rate and regular rhythm.     Pulses: Normal pulses.     Heart sounds: Normal heart sounds. No murmur heard. Pulmonary:     Effort: Pulmonary effort is normal. No respiratory distress.     Breath sounds: Normal breath sounds. No wheezing.  Chest:  Breasts:    Tanner Score is 5.     Right: Normal.     Left: Normal.  Abdominal:     General: Abdomen is flat. Bowel sounds are normal. There is no distension.     Palpations: Abdomen is soft.  Genitourinary:    Comments: deferred Musculoskeletal:        General: No swelling. Normal range of motion.     Cervical back: Normal range of motion and neck supple.  Skin:    General: Skin is warm and dry.     Capillary Refill: Capillary refill takes less than 2 seconds.     Findings: Rash present.  Neurological:     General: No focal deficit present.     Mental Status: She is alert and oriented to person, place, and time.  Psychiatric:        Mood and Affect: Mood normal.        Behavior: Behavior normal.        Assessment And Plan:     1. Routine general medical examination at a health care facility --Patient is here for their annual physical exam and we discussed any changes to medication and medical history.  -Behavior modification was discussed as well as diet and exercise history  -Patient will continue to exercise regularly and modify their diet.  -Recommendation for yearly physical annuals, immunization and screenings including mammogram and colonoscopy were discussed with the patient.  -Recommended intake of multivitamin, vitamin D and calcium.  -Individualized advise was given to the patient pertaining to their own health history  in regards to diet, exercise, medical condition and referrals.   2. Uncontrolled type 2 diabetes  mellitus with hyperglycemia ([place]) --Discussed with patient the importance of glycemic control and long term complications from uncontrolled diabetes. Discussed with the patient the importance of compliance with home glucose monitoring, diet which includes decrease amount of sugary drinks and foods. Importance of exercise was also discussed with the patient. Importance of eye exams, self foot care and compliance to office visits was also discussed with the patient.  -Chronic, continue meds. -Advised patient the importance of taking her meds and eating a healthy diet.  - POCT Urinalysis Dipstick (81002) - POCT UA - Microalbumin - Hemoglobin A1c - insulin degludec (TRESIBA FLEXTOUCH) 100 UNIT/ML FlexTouch Pen; Inject 45 Units into the skin daily.  Dispense: 45 mL; Refill: 2 - Semaglutide, 1 MG/DOSE, (OZEMPIC, 1 MG/DOSE,) 4 MG/3ML SOPN; Inject 1 mg into the skin once a week.  Dispense: 9 mL; Refill: 1  3. Essential hypertension -Limit the intake of processed foods and salt intake. You should increase your intake of green vegetables and fruits. Limit the use of alcohol. Limit fast foods and fried foods. Avoid high fatty saturated and trans fat foods. Keep yourself hydrated with drinking water. Avoid red meats. Eat lean meats instead. Exercise for atleast 30-45 min for atleast 4-5 times a week.  - EKG 12-Lead - CBC - CMP14+EGFR  4. Mixed hyperlipidemia --Educated patient about a diet that is low in fat and high fatty foods including dairy products. Increase in take of fish and fiber. Decrease intake of red meats and fast foods. Exercise for atleast 4-5 times a week or atleast 30-45 min. Drink a lot of water.   -Chronic, continue meds  - Lipid panel  5. Anxiety - Ambulatory referral to Psychology  6. Abnormal EKG -EKG showed SR with left atrial enlargement  - Ambulatory referral to Cardiology  7. Depression, unspecified depression type - buPROPion (WELLBUTRIN XL) 300 MG 24 hr tablet; TAKE 1  TABLET(300 MG) BY MOUTH DAILY  Dispense: 90 tablet; Refill: 1  8. Snoring - Ambulatory referral to Neurology  9. Vitamin D deficiency -Will check and supplement if needed. Advised patient to spend atleast 15 min. Daily in sunlight.  - Vitamin D (25 hydroxy)  10. Rash and nonspecific skin eruption - triamcinolone (KENALOG) 0.025 % ointment; Apply 1 application topically 2 (two) times daily.  Dispense: 30 g; Refill: 0  11. Allergy, subsequent encounter -take Flonase as needed.  -take zyrtec as needed.   12. Class 3 severe obesity due to excess calories with serious comorbidity and body mass index (BMI) of 50.0 to 59.9 in adult [facility]) -Advised patient on a healthy diet including avoiding fast food and red meats. Increase the intake of lean meats including grilled chicken and [place].  Drink a lot of water. Decrease intake of fatty foods. Exercise for 30-45 min. 4-5 a week to decrease the risk of cardiac event.   13. Immunization due - Flu Vaccine QUAD 6+ mos PF IM (Fluarix Quad PF) - Tdap vaccine greater than or equal to 7yo IM She is encouraged to strive for BMI less than 30 to decrease cardiac risk. Advised to aim for at least 150 minutes of exercise per week.   14. Urinary tract infection with hematuria, site unspecified - Urine Culture - nitrofurantoin, macrocrystal-monohydrate, (MACROBID) 100 MG capsule; Take 1 capsule (100 mg total) by mouth 2 (two) times daily for 7 days.  Dispense: 14 capsule; Refill: 0   Follow up:  if symptoms persist or do not get better.   The patient was encouraged to call or send a message through [place] for any questions or concerns.   Staying healthy and adopting a healthy lifestyle for your overall health is important. You should eat 7 or more servings of fruits and vegetables per day. You should drink plenty of water to keep yourself hydrated and your kidneys healthy. This includes about 65-80+ fluid ounces of water. Limit your intake of animal fats  especially for elevated cholesterol. Avoid highly processed food and limit your salt intake if you have hypertension. Avoid foods high in saturated/Trans fats. Along with a healthy diet it is also very important to maintain time for yourself to maintain a healthy mental health with low stress levels. You should get atleast 150 min of moderate intensity exercise weekly for a healthy heart. Along with eating right and exercising, aim for at least 7-9 hours of sleep daily.  Eat more whole grains which includes barley, wheat berries, oats, brown rice and whole wheat pasta. Use healthy plant oils which include olive, soy, corn, sunflower and peanut. Limit your caffeine and sugary drinks. Limit your intake of fast foods. Limit milk and dairy products to one or two daily servings.   Side effects and appropriate use of all the medication(s) were discussed with the patient today. Patient advised to use the medication(s) as directed by their healthcare provider. The patient was encouraged to read, review, and understand all associated package inserts and contact our office with any questions or concerns. The patient accepts the risks of the treatment plan and had an opportunity to ask questions.   Patient was given opportunity to ask questions. Patient verbalized understanding of the plan and was able to repeat key elements of the plan. All questions were answered to their satisfaction.  Raman Treazure Nery, DNP   I, Raman Jakyiah Briones have reviewed all documentation for this visit. The documentation on 04/18/21 for the exam, diagnosis, procedures, and orders are all accurate and complete.   THE PATIENT IS ENCOURAGED TO PRACTICE SOCIAL DISTANCING DUE TO THE COVID-19 PANDEMIC.

## 2021-04-18 NOTE — Addendum Note (Signed)
Addended by: Melba Coon on: 04/18/2021 05:04 PM   Modules accepted: Orders

## 2021-04-18 NOTE — Addendum Note (Signed)
Addended by: Michelle Nasuti on: 04/18/2021 04:47 PM   Modules accepted: Orders

## 2021-04-19 LAB — VITAMIN D 25 HYDROXY (VIT D DEFICIENCY, FRACTURES): Vit D, 25-Hydroxy: 20 ng/mL — ABNORMAL LOW (ref 30.0–100.0)

## 2021-04-19 LAB — LIPID PANEL
Chol/HDL Ratio: 4 ratio (ref 0.0–4.4)
Cholesterol, Total: 153 mg/dL (ref 100–199)
HDL: 38 mg/dL — ABNORMAL LOW (ref >39)
LDL Chol Calc (NIH): 95 mg/dL (ref 0–99)
Triglycerides: 107 mg/dL (ref 0–149)
VLDL Cholesterol Cal: 20 mg/dL (ref 5–40)

## 2021-04-19 LAB — CBC
Hematocrit: 42.7 % (ref 34.0–46.6)
Hemoglobin: 13.9 g/dL (ref 11.1–15.9)
MCH: 28 pg (ref 26.6–33.0)
MCHC: 32.6 g/dL (ref 31.5–35.7)
MCV: 86 fL (ref 79–97)
Platelets: 307 10*3/uL (ref 150–450)
RBC: 4.96 x10E6/uL (ref 3.77–5.28)
RDW: 13.4 % (ref 11.7–15.4)
WBC: 9.6 10*3/uL (ref 3.4–10.8)

## 2021-04-19 LAB — CMP14+EGFR
ALT: 11 IU/L (ref 0–32)
AST: 10 IU/L (ref 0–40)
Albumin/Globulin Ratio: 1.6 (ref 1.2–2.2)
Albumin: 4.4 g/dL (ref 3.8–4.8)
Alkaline Phosphatase: 74 IU/L (ref 44–121)
BUN/Creatinine Ratio: 13 (ref 9–23)
BUN: 10 mg/dL (ref 6–20)
Bilirubin Total: 0.5 mg/dL (ref 0.0–1.2)
CO2: 25 mmol/L (ref 20–29)
Calcium: 9.6 mg/dL (ref 8.7–10.2)
Chloride: 100 mmol/L (ref 96–106)
Creatinine, Ser: 0.8 mg/dL (ref 0.57–1.00)
Globulin, Total: 2.8 g/dL (ref 1.5–4.5)
Glucose: 133 mg/dL — ABNORMAL HIGH (ref 65–99)
Potassium: 4.3 mmol/L (ref 3.5–5.2)
Sodium: 141 mmol/L (ref 134–144)
Total Protein: 7.2 g/dL (ref 6.0–8.5)
eGFR: 96 mL/min/{1.73_m2} (ref >59)

## 2021-04-19 LAB — HEMOGLOBIN A1C
Est. average glucose Bld gHb Est-mCnc: 183 mg/dL
Hgb A1c MFr Bld: 8 % — ABNORMAL HIGH (ref 4.8–5.6)

## 2021-04-23 ENCOUNTER — Other Ambulatory Visit: Payer: Self-pay | Admitting: Nurse Practitioner

## 2021-04-23 DIAGNOSIS — E559 Vitamin D deficiency, unspecified: Secondary | ICD-10-CM

## 2021-04-23 MED ORDER — VITAMIN D (ERGOCALCIFEROL) 1.25 MG (50000 UNIT) PO CAPS: ORAL_CAPSULE | ORAL | 0 refills | Status: DC

## 2021-04-24 LAB — URINE CULTURE

## 2021-04-25 ENCOUNTER — Telehealth: Payer: Self-pay

## 2021-04-25 ENCOUNTER — Telehealth: Payer: Medicaid Other

## 2021-04-25 NOTE — Telephone Encounter (Signed)
  Care Management   Follow Up Note   04/25/2021 Name: [name] MRN: [id number] DOB: [birth year]   Referred by: Minette Brine, FNP Reason for referral : Care Coordination (RN CC Follow up call )  An unsuccessful telephone outreach was attempted today. The patient was referred to the case management team for assistance with care management and care coordination. Spoke with [name] briefly today, unfortunately she is not available to speak with me due to being at work. She will try to give me a call next week on her day off.   Follow Up Plan: Telephone follow up appointment with care management team member scheduled for: 05/09/21   Barb Merino, RN, BSN, CCM Care Management Coordinator [place] [facility]  Direct Phone: [phone]

## 2021-04-26 ENCOUNTER — Other Ambulatory Visit: Payer: Self-pay

## 2021-04-26 ENCOUNTER — Other Ambulatory Visit: Payer: Self-pay | Admitting: Nurse Practitioner

## 2021-04-26 DIAGNOSIS — E1165 Type 2 diabetes mellitus with hyperglycemia: Secondary | ICD-10-CM

## 2021-04-26 MED ORDER — CETIRIZINE HCL 10 MG PO TABS: 10.0000 mg | ORAL_TABLET | Freq: Every day | ORAL | 2 refills | Status: DC

## 2021-04-29 ENCOUNTER — Ambulatory Visit: Payer: Medicaid Other | Admitting: Podiatry

## 2021-05-09 ENCOUNTER — Telehealth: Payer: Medicaid Other

## 2021-05-09 ENCOUNTER — Ambulatory Visit: Payer: Self-pay

## 2021-05-09 DIAGNOSIS — E1165 Type 2 diabetes mellitus with hyperglycemia: Secondary | ICD-10-CM

## 2021-05-09 DIAGNOSIS — I10 Essential (primary) hypertension: Secondary | ICD-10-CM

## 2021-05-09 DIAGNOSIS — E559 Vitamin D deficiency, unspecified: Secondary | ICD-10-CM

## 2021-05-09 DIAGNOSIS — Z6841 Body Mass Index (BMI) 40.0 and over, adult: Secondary | ICD-10-CM

## 2021-05-15 ENCOUNTER — Encounter (HOSPITAL_BASED_OUTPATIENT_CLINIC_OR_DEPARTMENT_OTHER): Payer: Self-pay

## 2021-05-15 ENCOUNTER — Other Ambulatory Visit (HOSPITAL_BASED_OUTPATIENT_CLINIC_OR_DEPARTMENT_OTHER): Payer: Self-pay

## 2021-05-15 ENCOUNTER — Emergency Department (HOSPITAL_BASED_OUTPATIENT_CLINIC_OR_DEPARTMENT_OTHER): Payer: Medicaid Other

## 2021-05-15 ENCOUNTER — Other Ambulatory Visit: Payer: Self-pay

## 2021-05-15 ENCOUNTER — Emergency Department (HOSPITAL_BASED_OUTPATIENT_CLINIC_OR_DEPARTMENT_OTHER)
Admission: EM | Admit: 2021-05-15 | Discharge: 2021-05-15 | Disposition: A | Discharge status: Home / Self Care | Payer: Medicaid Other | Attending: Emergency Medicine | Admitting: Emergency Medicine

## 2021-05-15 DIAGNOSIS — E119 Type 2 diabetes mellitus without complications: Secondary | ICD-10-CM | POA: Insufficient documentation

## 2021-05-15 DIAGNOSIS — Z7951 Long term (current) use of inhaled steroids: Secondary | ICD-10-CM | POA: Insufficient documentation

## 2021-05-15 DIAGNOSIS — R300 Dysuria: Secondary | ICD-10-CM | POA: Diagnosis present

## 2021-05-15 DIAGNOSIS — Z794 Long term (current) use of insulin: Secondary | ICD-10-CM | POA: Insufficient documentation

## 2021-05-15 DIAGNOSIS — Z7984 Long term (current) use of oral hypoglycemic drugs: Secondary | ICD-10-CM | POA: Diagnosis not present

## 2021-05-15 DIAGNOSIS — J45909 Unspecified asthma, uncomplicated: Secondary | ICD-10-CM | POA: Insufficient documentation

## 2021-05-15 DIAGNOSIS — Z79899 Other long term (current) drug therapy: Secondary | ICD-10-CM | POA: Diagnosis not present

## 2021-05-15 DIAGNOSIS — K449 Diaphragmatic hernia without obstruction or gangrene: Secondary | ICD-10-CM | POA: Diagnosis not present

## 2021-05-15 DIAGNOSIS — Q8909 Congenital malformations of spleen: Secondary | ICD-10-CM | POA: Diagnosis not present

## 2021-05-15 DIAGNOSIS — A599 Trichomoniasis, unspecified: Secondary | ICD-10-CM | POA: Insufficient documentation

## 2021-05-15 DIAGNOSIS — Z87891 Personal history of nicotine dependence: Secondary | ICD-10-CM | POA: Insufficient documentation

## 2021-05-15 DIAGNOSIS — I10 Essential (primary) hypertension: Secondary | ICD-10-CM | POA: Diagnosis not present

## 2021-05-15 DIAGNOSIS — R5383 Other fatigue: Secondary | ICD-10-CM | POA: Diagnosis not present

## 2021-05-15 DIAGNOSIS — R109 Unspecified abdominal pain: Secondary | ICD-10-CM | POA: Insufficient documentation

## 2021-05-15 DIAGNOSIS — K429 Umbilical hernia without obstruction or gangrene: Secondary | ICD-10-CM | POA: Diagnosis not present

## 2021-05-15 DIAGNOSIS — D259 Leiomyoma of uterus, unspecified: Secondary | ICD-10-CM | POA: Diagnosis not present

## 2021-05-15 LAB — COMPREHENSIVE METABOLIC PANEL
ALT: 14 U/L (ref 0–44)
AST: 13 U/L — ABNORMAL LOW (ref 15–41)
Albumin: 3.7 g/dL (ref 3.5–5.0)
Alkaline Phosphatase: 64 U/L (ref 38–126)
Anion gap: 8 (ref 5–15)
BUN: 13 mg/dL (ref 6–20)
CO2: 26 mmol/L (ref 22–32)
Calcium: 9.4 mg/dL (ref 8.9–10.3)
Chloride: 103 mmol/L (ref 98–111)
Creatinine, Ser: 0.69 mg/dL (ref 0.44–1.00)
GFR, Estimated: >60 mL/min (ref >60)
Glucose, Bld: 125 mg/dL — ABNORMAL HIGH (ref 70–99)
Potassium: 4 mmol/L (ref 3.5–5.1)
Sodium: 137 mmol/L (ref 135–145)
Total Bilirubin: 0.3 mg/dL (ref 0.3–1.2)
Total Protein: 7.3 g/dL (ref 6.5–8.1)

## 2021-05-15 LAB — CBC WITH DIFFERENTIAL/PLATELET
Abs Immature Granulocytes: 0.03 10*3/uL (ref 0.00–0.07)
Basophils Absolute: 0.1 10*3/uL (ref 0.0–0.1)
Basophils Relative: 1 %
Eosinophils Absolute: 0.5 10*3/uL (ref 0.0–0.5)
Eosinophils Relative: 5 %
HCT: 44.8 % (ref 36.0–46.0)
Hemoglobin: 14.5 g/dL (ref 12.0–15.0)
Immature Granulocytes: 0 %
Lymphocytes Relative: 25 %
Lymphs Abs: 2.3 10*3/uL (ref 0.7–4.0)
MCH: 28.9 pg (ref 26.0–34.0)
MCHC: 32.4 g/dL (ref 30.0–36.0)
MCV: 89.4 fL (ref 80.0–100.0)
Monocytes Absolute: 0.4 10*3/uL (ref 0.1–1.0)
Monocytes Relative: 5 %
Neutro Abs: 5.7 10*3/uL (ref 1.7–7.7)
Neutrophils Relative %: 64 %
Platelets: 271 10*3/uL (ref 150–400)
RBC: 5.01 MIL/uL (ref 3.87–5.11)
RDW: 13.7 % (ref 11.5–15.5)
WBC: 9 10*3/uL (ref 4.0–10.5)
nRBC: 0 % (ref 0.0–0.2)

## 2021-05-15 LAB — URINALYSIS, MICROSCOPIC (REFLEX)

## 2021-05-15 LAB — URINALYSIS, ROUTINE W REFLEX MICROSCOPIC
Glucose, UA: >=500 mg/dL — AB
Ketones, ur: 15 mg/dL — AB
Leukocytes,Ua: NEGATIVE
Nitrite: NEGATIVE
Protein, ur: 100 mg/dL — AB
Specific Gravity, Urine: 1.03 (ref 1.005–1.030)
pH: 6 (ref 5.0–8.0)

## 2021-05-15 LAB — PREGNANCY, URINE: Preg Test, Ur: NEGATIVE

## 2021-05-15 LAB — LIPASE, BLOOD: Lipase: 46 U/L (ref 11–51)

## 2021-05-15 MED ORDER — METRONIDAZOLE 500 MG PO TABS
500.0000 mg | ORAL_TABLET | Freq: Two times a day (BID) | ORAL | 0 refills | Status: DC
  Filled 2021-05-15: qty 14, 7d supply, fill #0

## 2021-05-15 MED ORDER — ONDANSETRON HCL 4 MG/2ML IJ SOLN
4.0000 mg | Freq: Once | INTRAMUSCULAR | Status: AC
  Administered 2021-05-15: 4 mg via INTRAVENOUS
  Filled 2021-05-15: qty 2

## 2021-05-15 MED ORDER — KETOROLAC TROMETHAMINE 30 MG/ML IJ SOLN
30.0000 mg | Freq: Once | INTRAMUSCULAR | Status: AC
  Administered 2021-05-15: 30 mg via INTRAVENOUS
  Filled 2021-05-15: qty 1

## 2021-05-15 MED ORDER — ACETAMINOPHEN 325 MG PO TABS
650.0000 mg | ORAL_TABLET | Freq: Once | ORAL | Status: AC
  Administered 2021-05-15: 650 mg via ORAL
  Filled 2021-05-15: qty 2

## 2021-05-15 NOTE — ED Notes (Signed)
ED Provider at bedside. 

## 2021-05-15 NOTE — ED Triage Notes (Signed)
Pt c/o R flank pain x 4 days and dysuria x 3 days.  Pain score 10/10.  Pt reports muscle relaxer last night w/o relief.  Pt was diagnosed w/ an UTI x 3 weeks ago.  Pt started menstrual cycle x 4 days ago.

## 2021-05-15 NOTE — ED Triage Notes (Signed)
Triage delayed d/t Pt in restroom.

## 2021-05-15 NOTE — ED Notes (Signed)
Patient transported to CT 

## 2021-05-15 NOTE — Discharge Instructions (Addendum)
You tested positive for trichomonas which is a sexually transmitted disease.  Please take the antibiotic as prescribed for 7 days.  This parasite which was found living in your urine is likely causing the symptoms you described today.  I also have obtained a urine culture we will see if this grows any bacteria.  Please make sure your phone number is up-to-date in her system so we can contact you if this urine culture is abnormal.  Please use Tylenol or ibuprofen for pain.  You may use 600 mg ibuprofen every 6 hours or 1000 mg of Tylenol every 6 hours.  You may choose to alternate between the 2.  This would be most effective.  Not to exceed 4 g of Tylenol within 24 hours.  Not to exceed 3200 mg ibuprofen 24 hours.   Please wait 2 weeks AND be sure that you and your partners are symptom free before returning to sexual activity. Please use protection with every sexual encounter.   You have been treated in the emergency department for an infection, possibly sexually transmitted. Results of your gonorrhea and chlamydia tests are pending and you will be notified if they are positive. Please refrain from intercourse for 7 days and until all sex partners (within previous 60 days) are evaluated and/or treated as well. Please follow up with your primary care provider for continued care and further STD evaluation.  It is very important to practice safe sex and use condoms when sexually active. If your results are positive you need to notify all sexual partners so they can be treated as well. The website https://dean.info/ can be used to send anonymous text messages or emails to alert sexual contacts. Follow up with your doctor, or OBGYN in regards to today's visit.   Gonorrhea and Chlamydia SYMPTOMS  In females, symptoms may go unnoticed. Symptoms that are more noticeable can include:  Belly (abdominal) pain.  Painful intercourse.  Watery mucous-like discharge from the vagina.  Miscarriage.   Discomfort when urinating.  Inflammation of the rectum.  Abnormal gray-green frothy vaginal discharge  Vaginal itching and irritatio  Itching and irritation of the area outside the vagina.   Painful urination.  Bleeding after sexual intercourse.  In males, symptoms include:  Burning with urination.  Pain in the testicles.  Watery mucous-like discharge from the penis.  It can cause longstanding (chronic) pelvic pain after frequent infections.  TREATMENT  PID can cause women to not be able to have children (sterile) if left untreated or if half-treated.  It is important to finish ALL medications given to you.  This is a sexually transmitted infection. So you are also at risk for other sexually transmitted diseases, including HIV (AIDS), it is recommended that you get tested. HOME CARE INSTRUCTIONS  Warning: This infection is contagious. Do not have sex until treatment is completed. Follow up at your caregiver's office or the clinic to which you were referred. If your diagnosis (learning what is wrong) is confirmed by culture or some other method, your recent sexual contacts need treatment. Even if they are symptom free or have a negative culture or evaluation, they should be treated.  PREVENTION  Women should use sanitary pads instead of tampons for vaginal discharge.  Wipe front to back after using the toilet and avoid douching.   Practice safe sex, use condoms, have only one sex partner and be sure your sex partner is not having sex with others.  Ask your caregiver to test you for chlamydia at your  regular checkups or sooner if you are having symptoms.  Ask for further information if you are pregnant.  SEEK IMMEDIATE MEDICAL CARE IF:  You develop an oral temperature above 102 F (38.9 C), not controlled by medications or lasting more than 2 days.  You develop an increase in pain.  You develop any type of abnormal discharge.  You develop vaginal bleeding and it is not time for your  period.  You develop painful intercourse.   Bacterial Vaginosis  Bacterial vaginosis (BV) is a vaginal infection where the normal balance of bacteria in the vagina is disrupted. This is not a sexually transmitted disease and your sexual partners do NOT need to be treated. CAUSES  The cause of BV is not fully understood. BV develops when there is an increase or imbalance of harmful bacteria.  Some activities or behaviors can upset the normal balance of bacteria in the vagina and put women at increased risk including:  Having a new sex partner or multiple sex partners.  Douching.  Using an intrauterine device (IUD) for contraception.  It is not clear what role sexual activity plays in the development of BV. However, women that have never had sexual intercourse are rarely infected with BV.  Women do not get BV from toilet seats, bedding, swimming pools or from touching objects around them.   SYMPTOMS  Grey vaginal discharge.  A fish-like odor with discharge, especially after sexual intercourse.  Itching or burning of the vagina and vulva.  Burning or pain with urination.  Some women have no signs or symptoms at all.   TREATMENT  Sometimes BV will clear up without treatment.  BV may be treated with antibiotics.  BV can recur after treatment. If this happens, a second round of antibiotics will often be prescribed.  HOME CARE INSTRUCTIONS  Finish all medication as directed by your caregiver.  Do not have sex until treatment is completed.  Do NOT drink any alcoholic beverages while being treated  with Metronidazole (Flagyl). This will cause a severe reaction inducing vomiting.  RESOURCE GUIDE  Dental Problems  Patients with Medicaid: [place] Phone:  [phone]                                                  Phone:  [phone]  If unable to pay or  uninsured, contact:  [facility] or [facility]. to become qualified for the adult dental clinic.  Chronic Pain Problems Contact [facility]  [phone] Patients need to be referred by their primary care doctor.  Insufficient Money for Medicine Contact [employer]:  call "211" or [place] [phone].  No Primary Care Doctor Call [facility]  [phone] Other agencies that provide inexpensive medical care    [place]  [facility]  [place]  [phone]  Women's Clinic  [phone]    [facility]  [phone]    [place]  [phone] [facility]  [phone] [place]   [phone] (emergency services [phone])  Substance Abuse [facility]  [phone] [facility] [phone] [facility] [place] [phone] [place] Pester [phone] Residential & Outpatient Substance Abuse Program  [phone]  Abuse/Neglect [place] [phone] [place] [phone] (After Hours)  Emergency [place] [phone]  [place] [facility] [place] [phone] [place] [phone]  MRSA Hotline #:   215-126-3539    [facility] Dept. [address]      [place] Phone:  [phone]                                   Phone:  [phone]                 Phone:  [place] Phone:  [place] [phone] (After Hours)

## 2021-05-15 NOTE — ED Provider Notes (Signed)
[facility] EMERGENCY DEPARTMENT Provider Note   CSN: [id number] Arrival date & time: 05/15/21  0930     History Chief Complaint  Patient presents with   Dysuria   Flank Pain   Nausea    OTIE HEADLEE is a 40 y.o. female.  HPI Patient is a 40 year old female with past medical history significant for asthma, fibroids, hypertension, obesity, sleep apnea, UTIs she states that 3 weeks ago she was treated for a E. coli UTI.  States her symptoms completely resolved.  However is now complaining of Dysuria, back pain, fatigue, some nausea ongoing since 3 days ago.  States pain is achy constant and located on the right side of her back.  Denies fevers.  States that she has discomfort with urination as well as with holding her urine.  States she has some mild discomfort in her abdomen.  No chest pain or shortness of breath lightheadedness or dizziness.  Denies any chest pain or shortness of breath no lightheadedness or dizziness.  No cough congestion.  Mild achy abdominal pain  She states that she is having some vaginal bleeding.  She has been ongoing for the past 4 days.  She states she does not typically have vaginal bleeding given that she has a IUD    Past Medical History:  Diagnosis Date   Abnormal Pap smear    f/u wnl   Asthma    Fibroid    Gestational diabetes    Hypertension    Morbid obesity ([place])    Sleep apnea    Urinary tract infection     Patient Active Problem List   Diagnosis Date Noted   Dyslipidemia 11/30/2020   Weight gain 11/30/2020   Class 3 severe obesity due to excess calories with serious comorbidity and body mass index (BMI) of 50.0 to 59.9 in adult ([place]) 05/19/2019   Anemia 12/21/2018   Uncontrolled type 2 diabetes mellitus with hyperglycemia ([place]) 12/21/2018   Anxiety 07/19/2018   Depression 07/19/2018   Essential hypertension 07/19/2018   Fibroid uterus 09/17/2015   Menorrhagia 08/27/2015   Type 2 diabetes mellitus without  complication, with long-term current use of insulin ([place]) 09/13/2012   BMI 60.0-69.9, adult ([place]) 09/13/2012   Asthma 09/13/2012   History of high blood pressure 09/13/2012   OSA (obstructive sleep apnea) 12/10/2010    Past Surgical History:  Procedure Laterality Date   CESAREAN SECTION     DILATION AND CURETTAGE OF UTERUS     TUBAL LIGATION       OB History     Gravida  4   Para  2   Term  1   Preterm  1   AB  2   Living  3      SAB  2   IAB  0   Ectopic  0   Multiple  1   Live Births  3           Family History  Problem Relation Age of Onset   Diabetes Father    Allergies Mother    Asthma Mother    Hypertension Mother    Diabetes Mother    Diabetes Maternal Grandmother    Heart disease Maternal Grandmother    Anesthesia problems Neg Hx    Hypotension Neg Hx    Malignant hyperthermia Neg Hx    Pseudochol deficiency Neg Hx    Hearing loss Neg Hx     Social History   Tobacco Use   Smoking status:  Former    Types: Cigarettes    Quit date: 08/08/2019    Years since quitting: 1.7   Smokeless tobacco: Never  Vaping Use   Vaping Use: Never used  Substance Use Topics   Alcohol use: No    Comment: occasional   Drug use: No    Home Medications Prior to Admission medications   Medication Sig Start Date End Date Taking? Authorizing Provider  metroNIDAZOLE (FLAGYL) 500 MG tablet Take 1 tablet (500 mg total) by mouth 2 (two) times daily. 05/15/21  Yes Reinaldo Helt S, PA  albuterol (PROVENTIL) (5 MG/ML) 0.5% nebulizer solution Take 0.5 mLs (2.5 mg total) by nebulization every 6 (six) hours as needed for wheezing or shortness of breath. 04/15/19   Zigmund Gottron, NP  albuterol (VENTOLIN HFA) 108 (90 Base) MCG/ACT inhaler Inhale 1-2 puffs into the lungs every 6 (six) hours as needed for wheezing or shortness of breath. 07/23/20   Wieters, Hallie C, [name]  atorvastatin (LIPITOR) 20 MG tablet TAKE 1 TABLET(20 MG) BY MOUTH DAILY 04/26/21   Minette Brine,  FNP  blood glucose meter kit and supplies KIT Dispense based on patient and insurance preference. Use up to four times daily as directed. (FOR ICD-9 250.00, 250.01). 07/23/20   Wieters, Hallie C, [name]  buPROPion (WELLBUTRIN XL) 300 MG 24 hr tablet TAKE 1 TABLET(300 MG) BY MOUTH DAILY 04/18/21   Bary Castilla, NP  cetirizine (ZYRTEC ALLERGY) 10 MG tablet Take 1 tablet (10 mg total) by mouth daily. 04/26/21   Bary Castilla, NP  dapagliflozin propanediol (FARXIGA) 10 MG TABS tablet Take 1 tablet (10 mg total) by mouth daily. 11/30/20   Shamleffer, Melanie Crazier, MD  diclofenac (VOLTAREN) 50 MG EC tablet Take 1 tablet (50 mg total) by mouth 3 (three) times daily. 07/11/19   Woodroe Mode, MD  ferrous sulfate 325 (65 FE) MG EC tablet Take 1 tablet (325 mg total) by mouth 3 (three) times daily with meals. 10/31/19 04/18/21  Rodriguez-Southworth, Sunday Spillers, [name]  fluticasone (FLONASE) 50 MCG/ACT nasal spray Place 1-2 sprays into both nostrils daily. 04/18/21   Ghumman, Ramandeep, NP  glucose blood test strip USE TO CHECK BLOOD SUGARS UP TO 4 TIMES DAILY  DX CODE:E11.9 05/05/19   Rodriguez-Southworth, Sunday Spillers, [name]  ibuprofen (ADVIL) 800 MG tablet Take 800 mg by mouth 3 (three) times daily. 07/11/20   [provider]  insulin degludec (TRESIBA FLEXTOUCH) 100 UNIT/ML FlexTouch Pen Inject 45 Units into the skin daily. 04/18/21   Ghumman, Ramandeep, NP  Insulin Pen Needle 31G X 8 MM MISC 1 Device by Does not apply route daily. 11/30/20   Shamleffer, Melanie Crazier, MD  Lancets (ACCU-CHEK MULTICLIX) lancets USE TO CHECK BLOOD SUGARS UP TO 4 TIMES DAILY  DX CODE:E11.9 05/05/19   Rodriguez-Southworth, Sunday Spillers, [name]  levonorgestrel (MIRENA) 20 MCG/24HR IUD 1 each by Intrauterine route once.    [provider]  losartan-hydrochlorothiazide (HYZAAR) 50-12.5 MG tablet Take 1 tablet by mouth daily. 08/28/20 11/26/20  Minette Brine, FNP  metFORMIN (GLUCOPHAGE-XR) 750 MG 24 hr tablet TAKE 1 TABLET(750 MG)  BY MOUTH IN THE MORNING AND AT BEDTIME 06/04/20   Minette Brine, FNP  propranolol (INDERAL) 10 MG tablet TAKE 1 TABLET(10 MG) BY MOUTH EVERY EVENING 01/22/21   Minette Brine, FNP  Semaglutide, 1 MG/DOSE, (OZEMPIC, 1 MG/DOSE,) 4 MG/3ML SOPN Inject 1 mg into the skin once a week. 04/18/21   Ghumman, Ramandeep, NP  triamcinolone (KENALOG) 0.025 % ointment Apply 1 application topically 2 (two) times daily.  04/18/21   Bary Castilla, NP  Vitamin D, Ergocalciferol, (DRISDOL) 1.25 MG (50000 UNIT) CAPS capsule Take 1 capsule by mouth on Tues and on Fridays. 04/23/21   Bary Castilla, NP    Allergies    Other, Penicillins, and Bactrim [sulfamethoxazole-trimethoprim]  Review of Systems   Review of Systems  Constitutional:  Negative for chills and fever.  HENT:  Negative for congestion.   Eyes:  Negative for pain.  Respiratory:  Negative for cough and shortness of breath.   Cardiovascular:  Negative for chest pain and leg swelling.  Gastrointestinal:  Positive for abdominal pain and nausea. Negative for diarrhea and vomiting.  Genitourinary:  Positive for dysuria and vaginal bleeding. Negative for vaginal discharge and vaginal pain.  Musculoskeletal:  Negative for myalgias.  Skin:  Negative for rash.  Neurological:  Negative for dizziness and headaches.   Physical Exam Updated Vital Signs BP (!) 145/97   Pulse 81   Temp 99.3 F (37.4 C) (Oral)   Resp 18   Ht '5\' 6"'  (1.676 m)   Wt (!) 140.6 kg   SpO2 99%   BMI 50.04 kg/m   Physical Exam Vitals and nursing note reviewed.  Constitutional:      General: She is not in acute distress.    Appearance: She is obese.     Comments: Pleasant well-appearing 40 year old.  In no acute distress.  Sitting comfortably in bed.  Able answer questions appropriately follow commands. No increased work of breathing. Speaking in full sentences.   HENT:     Head: Normocephalic and atraumatic.     Nose: Nose normal.  Eyes:     General: No scleral  icterus. Cardiovascular:     Rate and Rhythm: Normal rate and regular rhythm.     Pulses: Normal pulses.     Heart sounds: Normal heart sounds.  Pulmonary:     Effort: Pulmonary effort is normal. No respiratory distress.     Breath sounds: No wheezing.  Abdominal:     Palpations: Abdomen is soft.     Tenderness: There is no abdominal tenderness. There is right CVA tenderness. There is no left CVA tenderness, guarding or rebound.  Genitourinary:    Comments: Deferred by patient Musculoskeletal:     Cervical back: Normal range of motion.     Right lower leg: No edema.     Left lower leg: No edema.  Skin:    General: Skin is warm and dry.     Capillary Refill: Capillary refill takes less than 2 seconds.  Neurological:     Mental Status: She is alert. Mental status is at baseline.  Psychiatric:        Mood and Affect: Mood normal.        Behavior: Behavior normal.    ED Results / Procedures / Treatments   Labs (all labs ordered are listed, but only abnormal results are displayed) Labs Reviewed  URINALYSIS, ROUTINE W REFLEX MICROSCOPIC - Abnormal; Notable for the following components:      Result Value   Color, Urine AMBER (*)    Glucose, UA >=500 (*)    Hgb urine dipstick TRACE (*)    Bilirubin Urine SMALL (*)    Ketones, ur 15 (*)    Protein, ur 100 (*)    All other components within normal limits  COMPREHENSIVE METABOLIC PANEL - Abnormal; Notable for the following components:   Glucose, Bld 125 (*)    AST 13 (*)    All other components within  normal limits  URINALYSIS, MICROSCOPIC (REFLEX) - Abnormal; Notable for the following components:   Bacteria, UA RARE (*)    All other components within normal limits  URINE CULTURE  PREGNANCY, URINE  CBC WITH DIFFERENTIAL/PLATELET  LIPASE, BLOOD    EKG None  Radiology CT Renal Stone Study  Result Date: 05/15/2021 CLINICAL DATA:  Right flank pain, kidney stone suspected, hematuria EXAM: CT ABDOMEN AND PELVIS WITHOUT  CONTRAST TECHNIQUE: Multidetector CT imaging of the abdomen and pelvis was performed following the standard protocol without IV contrast. COMPARISON:  12/15/2014 CT abdomen pelvis FINDINGS: Lower chest: No acute abnormality. Hepatobiliary: No focal liver abnormality is seen. No gallstones, gallbladder wall thickening, or biliary dilatation. Pancreas: Unremarkable. No pancreatic ductal dilatation or surrounding inflammatory changes. Spleen: Normal in size without focal abnormality.  Splenule. Adrenals/Urinary Tract: Unchanged mild thickening of the left adrenal gland. The adrenal glands are otherwise unremarkable. Kidneys are symmetric in size with no hydronephrosis or nephrolithiasis. The ureters and bladder are unremarkable. Stomach/Bowel: Small hiatal hernia. Stomach is otherwise normal. Appendix appears normal. No evidence of bowel wall thickening, distention, or inflammatory changes. Vascular/Lymphatic: No significant vascular findings are present. No enlarged abdominal or pelvic lymph nodes. Reproductive: Redemonstrated calcified pedunculated fibroid at the anterior aspect of the uterus. An IUD is noted. The bilateral adnexa are unremarkable. Other: Small fat containing periumbilical hernia. No free air or free fluid in the abdomen or pelvis. Musculoskeletal: No acute osseous abnormality. IMPRESSION: 1. No evidence of hydronephrosis, nephrolithiasis, or urolithiasis. No etiology is seen for the patient's flank pain. 2. No acute process in the abdomen or pelvis. Electronically Signed   By: Merilyn Baba M.D.   On: 05/15/2021 11:54    Procedures Procedures   Medications Ordered in ED Medications  ketorolac (TORADOL) 30 MG/ML injection 30 mg (30 mg Intravenous Given 05/15/21 1107)  acetaminophen (TYLENOL) tablet 650 mg (650 mg Oral Given 05/15/21 1100)  ondansetron (ZOFRAN) injection 4 mg (4 mg Intravenous Given 05/15/21 1101)    ED Course  I have reviewed the triage vital signs and the nursing  notes.  Pertinent labs & imaging results that were available during my care of the patient were reviewed by me and considered in my medical decision making (see chart for details).  Clinical Course as of 05/15/21 1216  Wed May 15, 2021  1035 Dysuria, back pain, fatigue, some nausea ongoing since 3 days ago.  States pain is achy constant and located on the right side of her back.  Denies fevers.  States that she has discomfort with urination as well as with holding her urine.  States she has some mild discomfort in her abdomen.  No chest pain or shortness of breath lightheadedness or dizziness. [WF]    Clinical Course User Index [WF] Tedd Sias, PA   MDM Rules/Calculators/A&P                           Patient is a 40 year old female presented today with some dysuria, some back pain on the right side, some fatigue.  She has no midline back pain with palpation but does have some right CVA tenderness.  No history of kidney stones  Patient does have blood in urine however is currently on menstrual cycle.  Given the nature of her pain will obtain CT renal stone study.  This was negative for renal stones  I personally reviewed all laboratory work and imaging.  Metabolic panel without any acute abnormality specifically  kidney function within normal limits and no significant electrolyte abnormalities. CBC without leukocytosis or significant anemia.   Lipase of normal limits topicalized.  Urine culture pending.  Urinalysis with rare bacteria negative for leukocytes or nitrates.  There is trace hemoglobin and some hyaline cast present.  Urine pregnancy negative.  Trichomonas detected on urinalysis.  We will treat.  I suspect that this is the reason for her dysuria symptoms.  Urine culture still pending.  Will treat with Flagyl.  Prescription hand written for patient's boyfriend who is under with her given that they are having unprotected intercourse and he has been exposed to trichomonas.   He is not having any symptoms.  Final Clinical Impression(s) / ED Diagnoses Final diagnoses:  Trichomonas infection    Rx / DC Orders ED Discharge Orders          Ordered    metroNIDAZOLE (FLAGYL) 500 MG tablet  2 times daily        05/15/21 1213             Pati Gallo [employer], [place] 05/15/21 [place], [place], DO 05/15/21 1229

## 2021-05-16 DIAGNOSIS — F411 Generalized anxiety disorder: Secondary | ICD-10-CM | POA: Diagnosis not present

## 2021-05-16 LAB — URINE CULTURE: Culture: NO GROWTH

## 2021-05-20 ENCOUNTER — Encounter: Payer: Self-pay | Admitting: Nurse Practitioner

## 2021-05-21 ENCOUNTER — Encounter: Payer: Self-pay | Admitting: Nurse Practitioner

## 2021-05-21 ENCOUNTER — Telehealth: Payer: Medicaid Other

## 2021-05-21 ENCOUNTER — Other Ambulatory Visit (HOSPITAL_COMMUNITY)
Admission: RE | Admit: 2021-05-21 | Discharge: 2021-05-21 | Disposition: A | Discharge status: Home / Self Care | Payer: Medicaid Other | Source: Ambulatory Visit | Attending: Nurse Practitioner | Admitting: Nurse Practitioner

## 2021-05-21 ENCOUNTER — Other Ambulatory Visit: Payer: Self-pay

## 2021-05-21 ENCOUNTER — Ambulatory Visit (INDEPENDENT_AMBULATORY_CARE_PROVIDER_SITE_OTHER): Payer: Medicaid Other | Admitting: Nurse Practitioner

## 2021-05-21 VITALS — BP 134/70 | HR 96 | Temp 98.3°F | Ht 67.4 in | Wt 340.2 lb

## 2021-05-21 DIAGNOSIS — Z113 Encounter for screening for infections with a predominantly sexual mode of transmission: Secondary | ICD-10-CM | POA: Insufficient documentation

## 2021-05-21 DIAGNOSIS — N898 Other specified noninflammatory disorders of vagina: Secondary | ICD-10-CM | POA: Insufficient documentation

## 2021-05-21 DIAGNOSIS — N760 Acute vaginitis: Secondary | ICD-10-CM | POA: Diagnosis not present

## 2021-05-21 DIAGNOSIS — N949 Unspecified condition associated with female genital organs and menstrual cycle: Secondary | ICD-10-CM | POA: Diagnosis not present

## 2021-05-21 MED ORDER — FLUCONAZOLE 150 MG PO TABS: 150.0000 mg | ORAL_TABLET | Freq: Every day | ORAL | 1 refills | Status: DC

## 2021-05-21 NOTE — Progress Notes (Signed)
I,Victoria Hamilton, acting as a Education administrator for [employer], FNP.,have documented all relevant documentation on the behalf of Minette Brine, FNP,as directed by  Minette Brine, FNP while in the presence of Minette Brine, [place].   This visit occurred during the SARS-CoV-2 public health emergency.  Safety protocols were in place, including screening questions prior to the visit, additional usage of staff PPE, and extensive cleaning of exam room while observing appropriate contact time as indicated for disinfecting solutions.  Subjective:     Patient ID: [name] , female    DOB: 1980/12/22 , 40 y.o.   MRN: [id number]   Chief Complaint  Patient presents with   Vaginitis   Vaginal Discharge     HPI  She came a few weeks ago for a UTI that was treated. She continued to have back pain after being treated. She was admitted to [facility] they diagnosed her with [facility]. She is being treated with metronidazole. She now has swelling to her vaginal area.  She experiences swelling in the area. She has had the same partner for years. She still is currently taking antibiotic for STI. Urinalysis culture was negative for bacteria.     Past Medical History:  Diagnosis Date   Abnormal Pap smear    f/u wnl   Asthma    Fibroid    Gestational diabetes    Hypertension    Morbid obesity ([place])    Sleep apnea    Urinary tract infection      Family History  Problem Relation Age of Onset   Diabetes Father    Allergies Mother    Asthma Mother    Hypertension Mother    Diabetes Mother    Diabetes Maternal Grandmother    Heart disease Maternal Grandmother    Anesthesia problems Neg Hx    Hypotension Neg Hx    Malignant hyperthermia Neg Hx    Pseudochol deficiency Neg Hx    Hearing loss Neg Hx      Current Outpatient Medications:    albuterol (PROVENTIL) (5 MG/ML) 0.5% nebulizer solution, Take 0.5 mLs (2.5 mg total) by nebulization every 6 (six) hours as needed for wheezing or shortness of breath.,  Disp: 20 mL, Rfl: 12   albuterol (VENTOLIN HFA) 108 (90 Base) MCG/ACT inhaler, Inhale 1-2 puffs into the lungs every 6 (six) hours as needed for wheezing or shortness of breath., Disp: 18 g, Rfl: 0   atorvastatin (LIPITOR) 20 MG tablet, TAKE 1 TABLET(20 MG) BY MOUTH DAILY, Disp: 90 tablet, Rfl: 1   blood glucose meter kit and supplies KIT, Dispense based on patient and insurance preference. Use up to four times daily as directed. (FOR ICD-9 250.00, 250.01)., Disp: 1 each, Rfl: 0   buPROPion (WELLBUTRIN XL) 300 MG 24 hr tablet, TAKE 1 TABLET(300 MG) BY MOUTH DAILY, Disp: 90 tablet, Rfl: 1   cetirizine (ZYRTEC ALLERGY) 10 MG tablet, Take 1 tablet (10 mg total) by mouth daily., Disp: 90 tablet, Rfl: 2   dapagliflozin propanediol (FARXIGA) 10 MG TABS tablet, Take 1 tablet (10 mg total) by mouth daily., Disp: 90 tablet, Rfl: 1   diclofenac (VOLTAREN) 50 MG EC tablet, Take 1 tablet (50 mg total) by mouth 3 (three) times daily., Disp: 45 tablet, Rfl: 3   ferrous sulfate 325 (65 FE) MG EC tablet, Take 1 tablet (325 mg total) by mouth 3 (three) times daily with meals., Disp: 90 tablet, Rfl: 2   fluticasone (FLONASE) 50 MCG/ACT nasal spray, Place 1-2 sprays  into both nostrils daily., Disp: 16 g, Rfl: 0   glucose blood test strip, USE TO CHECK BLOOD SUGARS UP TO 4 TIMES DAILY  DX CODE:E11.9, Disp: 100 each, Rfl: 12   ibuprofen (ADVIL) 800 MG tablet, Take 800 mg by mouth 3 (three) times daily., Disp: , Rfl:    insulin degludec (TRESIBA FLEXTOUCH) 100 UNIT/ML FlexTouch Pen, Inject 45 Units into the skin daily., Disp: 45 mL, Rfl: 2   Insulin Pen Needle 31G X 8 MM MISC, 1 Device by Does not apply route daily., Disp: 100 each, Rfl: 1   Lancets (ACCU-CHEK MULTICLIX) lancets, USE TO CHECK BLOOD SUGARS UP TO 4 TIMES DAILY  DX CODE:E11.9, Disp: 100 each, Rfl: 12   levonorgestrel (MIRENA) 20 MCG/24HR IUD, 1 each by Intrauterine route once., Disp: , Rfl:    losartan-hydrochlorothiazide (HYZAAR) 50-12.5 MG tablet, Take 1  tablet by mouth daily., Disp: 90 tablet, Rfl: 1   metFORMIN (GLUCOPHAGE-XR) 750 MG 24 hr tablet, TAKE 1 TABLET(750 MG) BY MOUTH IN THE MORNING AND AT BEDTIME, Disp: 180 tablet, Rfl: 1   metroNIDAZOLE (FLAGYL) 500 MG tablet, Take 1 tablet (500 mg total) by mouth 2 (two) times daily., Disp: 14 tablet, Rfl: 0   propranolol (INDERAL) 10 MG tablet, TAKE 1 TABLET(10 MG) BY MOUTH EVERY EVENING, Disp: 90 tablet, Rfl: 1   Semaglutide, 1 MG/DOSE, (OZEMPIC, 1 MG/DOSE,) 4 MG/3ML SOPN, Inject 1 mg into the skin once a week., Disp: 9 mL, Rfl: 1   triamcinolone (KENALOG) 0.025 % ointment, Apply 1 application topically 2 (two) times daily., Disp: 30 g, Rfl: 0   Vitamin D, Ergocalciferol, (DRISDOL) 1.25 MG (50000 UNIT) CAPS capsule, Take 1 capsule by mouth on Tues and on Fridays., Disp: 24 capsule, Rfl: 0   Allergies  Allergen Reactions   Other Anaphylaxis and Other (See Comments)    Pt has fish allergy Pt has allergy to butter cookies   Penicillins Rash and Other (See Comments)    Told MD that PCN reaction is rash and that she has taken amoxicillin before   Bactrim [Sulfamethoxazole-Trimethoprim]     Diarrhea, abdominal cramps and rash     Review of Systems  Constitutional: Negative.   Respiratory: Negative.    Cardiovascular: Negative.  Negative for chest pain, palpitations and leg swelling.  Neurological: Negative.   Psychiatric/Behavioral: Negative.      Today's Vitals   05/21/21 1458  BP: 134/70  Pulse: 96  Temp: 98.3 F (36.8 C)  TempSrc: Oral  Weight: (!) 340 lb 3.2 oz (154.3 kg)  Height: 5' 7.4" (1.712 m)   Body mass index is 52.65 kg/m.  Wt Readings from Last 3 Encounters:  05/21/21 (!) 340 lb 3.2 oz (154.3 kg)  05/15/21 (!) 310 lb (140.6 kg)  04/18/21 (!) 332 lb 9.6 oz (150.9 kg)    Objective:  Physical Exam Vitals reviewed.  Constitutional:      General: She is not in acute distress.    Appearance: Normal appearance. She is obese.  Cardiovascular:     Rate and Rhythm:  Normal rate and regular rhythm.  Pulmonary:     Effort: Pulmonary effort is normal. No respiratory distress.  Neurological:     General: No focal deficit present.     Mental Status: She is alert and oriented to person, place, and time.     Cranial Nerves: No cranial nerve deficit.     Motor: No weakness.  Psychiatric:        Mood and Affect: Mood normal.  Behavior: Behavior normal.        Thought Content: Thought content normal.        Judgment: Judgment normal.        Assessment And Plan:     1. Vaginal discomfort Comments: Had Hyaline cast in her urine, thought to be trichomonas - Cervicovaginal ancillary only  2. Encounter for screening examination for sexually transmitted disease - Cervicovaginal ancillary only - HIV antibody (with reflex) - T pallidum Screening [place]     Patient was given opportunity to ask questions. Patient verbalized understanding of the plan and was able to repeat key elements of the plan. All questions were answered to their satisfaction.  Minette Brine, FNP   I, Minette Brine, FNP, have reviewed all documentation for this visit. The documentation on 05/21/21 for the exam, diagnosis, procedures, and orders are all accurate and complete.   IF YOU HAVE BEEN REFERRED TO A SPECIALIST, IT MAY TAKE 1-2 WEEKS TO SCHEDULE/PROCESS THE REFERRAL. IF YOU HAVE NOT HEARD FROM US/SPECIALIST IN TWO WEEKS, PLEASE GIVE [place] A CALL AT [phone] X [phone].   THE PATIENT IS ENCOURAGED TO PRACTICE SOCIAL DISTANCING DUE TO THE COVID-19 PANDEMIC.

## 2021-05-22 LAB — HIV ANTIBODY (ROUTINE TESTING W REFLEX): HIV Screen 4th Generation wRfx: NONREACTIVE

## 2021-05-22 LAB — T PALLIDUM SCREENING CASCADE: T pallidum Antibodies (TP-PA): NONREACTIVE

## 2021-05-23 DIAGNOSIS — F411 Generalized anxiety disorder: Secondary | ICD-10-CM | POA: Diagnosis not present

## 2021-05-23 NOTE — Chronic Care Management (AMB) (Addendum)
Care Management   CCM RN Visit Note  05/09/2021 Name: [name] MRN: [id number] DOB: 1981-05-04  Subjective: [name] is a 40 y.o. year old female who is a primary care patient of Minette Brine, [place]. The care management team was consulted for assistance with disease management and care coordination needs.    Engaged with patient by telephone for follow up visit in response to provider referral for case management and/or care coordination services.   Consent to Services:  The patient was given information about Chronic Care Management services, agreed to services, and gave verbal consent prior to initiation of services.  Please see initial visit note for detailed documentation.   Patient agreed to services and verbal consent obtained.   Assessment: Review of patient past medical history, allergies, medications, health status, including review of consultants reports, laboratory and other test data, was performed as part of comprehensive evaluation and provision of chronic care management services.   SDOH (Social Determinants of Health) assessments and interventions performed:    CCM Care Plan  Allergies  Allergen Reactions   Other Anaphylaxis and Other (See Comments)    Pt has fish allergy Pt has allergy to butter cookies   Penicillins Rash and Other (See Comments)    Told MD that PCN reaction is rash and that she has taken amoxicillin before   Bactrim [Sulfamethoxazole-Trimethoprim]     Diarrhea, abdominal cramps and rash    Outpatient Encounter Medications as of 05/09/2021  Medication Sig Note   albuterol (PROVENTIL) (5 MG/ML) 0.5% nebulizer solution Take 0.5 mLs (2.5 mg total) by nebulization every 6 (six) hours as needed for wheezing or shortness of breath.    albuterol (VENTOLIN HFA) 108 (90 Base) MCG/ACT inhaler Inhale 1-2 puffs into the lungs every 6 (six) hours as needed for wheezing or shortness of breath.    atorvastatin (LIPITOR) 20 MG tablet TAKE 1 TABLET(20  MG) BY MOUTH DAILY    blood glucose meter kit and supplies KIT Dispense based on patient and insurance preference. Use up to four times daily as directed. (FOR ICD-9 250.00, 250.01).    buPROPion (WELLBUTRIN XL) 300 MG 24 hr tablet TAKE 1 TABLET(300 MG) BY MOUTH DAILY    cetirizine (ZYRTEC ALLERGY) 10 MG tablet Take 1 tablet (10 mg total) by mouth daily.    dapagliflozin propanediol (FARXIGA) 10 MG TABS tablet Take 1 tablet (10 mg total) by mouth daily.    diclofenac (VOLTAREN) 50 MG EC tablet Take 1 tablet (50 mg total) by mouth 3 (three) times daily.    fluticasone (FLONASE) 50 MCG/ACT nasal spray Place 1-2 sprays into both nostrils daily.    glucose blood test strip USE TO CHECK BLOOD SUGARS UP TO 4 TIMES DAILY  DX CODE:E11.9    ibuprofen (ADVIL) 800 MG tablet Take 800 mg by mouth 3 (three) times daily. 08/30/2020: Was taking this medicine because she needed her tooth pulled    insulin degludec (TRESIBA FLEXTOUCH) 100 UNIT/ML FlexTouch Pen Inject 45 Units into the skin daily.    Insulin Pen Needle 31G X 8 MM MISC 1 Device by Does not apply route daily.    Lancets (ACCU-CHEK MULTICLIX) lancets USE TO CHECK BLOOD SUGARS UP TO 4 TIMES DAILY  DX CODE:E11.9    levonorgestrel (MIRENA) 20 MCG/24HR IUD 1 each by Intrauterine route once.    metFORMIN (GLUCOPHAGE-XR) 750 MG 24 hr tablet TAKE 1 TABLET(750 MG) BY MOUTH IN THE MORNING AND AT BEDTIME    propranolol (INDERAL) 10 MG  tablet TAKE 1 TABLET(10 MG) BY MOUTH EVERY EVENING    Semaglutide, 1 MG/DOSE, (OZEMPIC, 1 MG/DOSE,) 4 MG/3ML SOPN Inject 1 mg into the skin once a week.    triamcinolone (KENALOG) 0.025 % ointment Apply 1 application topically 2 (two) times daily.    Vitamin D, Ergocalciferol, (DRISDOL) 1.25 MG (50000 UNIT) CAPS capsule Take 1 capsule by mouth on Tues and on Fridays.    No facility-administered encounter medications on file as of 05/09/2021.    Patient Active Problem List   Diagnosis Date Noted   Dyslipidemia 11/30/2020    Weight gain 11/30/2020   Class 3 severe obesity due to excess calories with serious comorbidity and body mass index (BMI) of 50.0 to 59.9 in adult ([place]) 05/19/2019   Anemia 12/21/2018   Uncontrolled type 2 diabetes mellitus with hyperglycemia ([place]) 12/21/2018   Anxiety 07/19/2018   Depression 07/19/2018   Essential hypertension 07/19/2018   Fibroid uterus 09/17/2015   Menorrhagia 08/27/2015   Type 2 diabetes mellitus without complication, with long-term current use of insulin ([place]) 09/13/2012   BMI 60.0-69.9, adult ([place]) 09/13/2012   Asthma 09/13/2012   History of high blood pressure 09/13/2012   OSA (obstructive sleep apnea) 12/10/2010    Conditions to be addressed/monitor: HTN, DMII, and Class III Obesity, Vitamin D deficiency   Care Plan : RN Care Manager Plan of Care  Updates made by Lynne Logan, RN since 05/09/2021 12:00 AM     Problem: Chronic disease eduction and Care Coordination needs for DMII, HTN, Class 3 Obesity, Vitamin D deficiency   Priority: High     Long-Range Goal: Assist with Chronic disease education and Care Coordination needs for DMII, HTN, Class 3 Obesity, Vitamin D deficiency   Start Date: 05/09/2021  Expected End Date: 05/09/2022  This Visit's Progress: On track  Priority: High  Note:   Current Barriers:  Knowledge Deficits related to plan of care for management of HTN, DMII, and Class 3 Obesity, Vitamin D deficiency Chronic Disease Management support and education needs related to HTN, DMII, and Class 3 Obesity  RNCM Clinical Goal(s):  Patient will demonstrate Ongoing health management independence   continue to work with RN Care Manager to address care management and care coordination needs related to  HTN, DMII, and Class III Obesity, Vitamin D deficiency  through collaboration with RN Care manager, provider, and care team.   Interventions: 1:1 collaboration with primary care provider regarding development and update of comprehensive plan of care  as evidenced by provider attestation and co-signature Inter-disciplinary care team collaboration (see longitudinal plan of care) Evaluation of current treatment plan related to  self management and patient's adherence to plan as established by provider  Weight Loss Interventions:    Body mass index is 52.65 kg/m.     Wt Readings from Last 3 Encounters:  05/21/21 (!) 340 lb 3.2 oz (154.3 kg)  05/15/21 (!) 310 lb (140.6 kg)  04/18/21 (!) 332 lb 9.6 oz (150.9 kg)  Advised patient to discuss with primary care provider options regarding weight management;  Provided verbal and/or written education to patient re: provider recommended life style modifications;  Reviewed recommended dietary changes: avoid fad diets, make small/incremental dietary and exercise changes, eat at the table and avoid eating in front of the TV, plan management of cravings, monitor snacking and cravings in food diary;   Hypertension Interventions: Last practice recorded BP readings:  BP Readings from Last 3 Encounters:  05/21/21 134/70  05/15/21 (!) 145/97  04/18/21 124/86  Most recent eGFR/CrCl:  Lab Results  Component Value Date   EGFR 96 04/18/2021    No components found for: CRCL  Evaluation of current treatment plan related to hypertension self management and patient's adherence to plan as established by provider; Reviewed medications with patient and discussed importance of compliance; Counseled on the importance of exercise goals with target of 150 minutes per week Discussed plans with patient for ongoing care management follow up and provided patient with direct contact information for care management team; Advised patient, providing education and rationale, to monitor blood pressure daily and record, calling PCP for findings outside established parameters;  Provided education on prescribed diet low Sodium;  Discussed complications of poorly controlled blood pressure such as heart disease, stroke,  circulatory complications, vision complications, kidney impairment, sexual dysfunction;  New goal. Vit D, 25-Hydroxy 30.0 - 100.0 ng/mL 20.0 Low    Evaluation of current treatment plan related to  Vitamin D deficiency , self-management and patient's adherence to plan as established by provider. Discussed plans with patient for ongoing care management follow up and provided patient with direct contact information for care management team Provided education to patient re: dietary recommendations, getting 15 minutes of natural sunlight daily when possible ; Reviewed medications with patient and discussed dosage, frequency and indication for use of Vitamin D supplementation; Discussed plans with patient for ongoing care management follow up and provided patient with direct contact information for care management team; Assessed social determinant of health barriers;   Diabetes Interventions: Assessed patient's understanding of A1c goal: <7% Provided education to patient about basic DM disease process; Reviewed medications with patient and discussed importance of medication adherence; Discussed plans with patient for ongoing care management follow up and provided patient with direct contact information for care management team; Advised patient, providing education and rationale, to check cbg daily before meals and bedtime and record, calling PCP and or RN Care Manager for findings outside established parameters; Review of patient status, including review of consultants reports, relevant laboratory and other test results, and medications completed; Lab Results  Component Value Date   HGBA1C 8.0 (H) 04/18/2021   Patient Goals/Self-Care Activities: Patient will self administer medications as prescribed Patient will attend all scheduled provider appointments Patient will call pharmacy for medication refills Patient will call provider office for new concerns or questions  Follow Up Plan:  Telephone  follow up appointment with care management team member scheduled for:  06/05/21     Plan:Telephone follow up appointment with care management team member scheduled for:  06/05/21  Barb Merino, RN, BSN, CCM Care Management Coordinator [place] [facility]  Direct Phone: [phone]

## 2021-05-23 NOTE — Patient Instructions (Signed)
Visit Information   Consent to CCM Services: [name] was given information about Chronic Care Management services including:  CCM service includes personalized support from designated clinical staff supervised by her physician, including individualized plan of care and coordination with other care providers 24/7 contact phone numbers for assistance for urgent and routine care needs. Service will only be billed when office clinical staff spend 20 minutes or more in a month to coordinate care. Only one practitioner may furnish and bill the service in a calendar month. The patient may stop CCM services at any time (effective at the end of the month) by phone call to the office staff. The patient will be responsible for cost sharing (co-pay) of up to 20% of the service fee (after annual deductible is met).  Patient agreed to services and verbal consent obtained.   The patient verbalized understanding of instructions, educational materials, and care plan provided today and declined offer to receive copy of patient instructions, educational materials, and care plan.   Telephone follow up appointment with care management team member scheduled for: 06/05/21  Barb Merino, RN, BSN, CCM Care Management Coordinator [place] [facility]  Direct Phone: [phone]    CLINICAL CARE PLAN:  Patient Care Plan: RN Care Manager Plan of Care     Problem Identified: Chronic disease eduction and Care Coordination needs for DMII, HTN, Class 3 Obesity, Vitamin D deficiency   Priority: High     Long-Range Goal: Assist with Chronic disease education and Care Coordination needs for DMII, HTN, Class 3 Obesity, Vitamin D deficiency   Start Date: 05/09/2021  Expected End Date: 05/09/2022  This Visit's Progress: On track  Priority: High  Note:   Current Barriers:  Knowledge Deficits related to plan of care for management of HTN, DMII, and Class 3 Obesity, Vitamin D  deficiency Chronic Disease Management support and education needs related to HTN, DMII, and Class 3 Obesity  RNCM Clinical Goal(s):  Patient will demonstrate Ongoing health management independence   continue to work with RN Care Manager to address care management and care coordination needs related to  HTN, DMII, and Class III Obesity, Vitamin D deficiency  through collaboration with RN Care manager, provider, and care team.   Interventions: 1:1 collaboration with primary care provider regarding development and update of comprehensive plan of care as evidenced by provider attestation and co-signature Inter-disciplinary care team collaboration (see longitudinal plan of care) Evaluation of current treatment plan related to  self management and patient's adherence to plan as established by provider  Weight Loss Interventions:    Body mass index is 52.65 kg/m.     Wt Readings from Last 3 Encounters:  05/21/21 (!) 340 lb 3.2 oz (154.3 kg)  05/15/21 (!) 310 lb (140.6 kg)  04/18/21 (!) 332 lb 9.6 oz (150.9 kg)  Advised patient to discuss with primary care provider options regarding weight management;  Provided verbal and/or written education to patient re: provider recommended life style modifications;  Reviewed recommended dietary changes: avoid fad diets, make small/incremental dietary and exercise changes, eat at the table and avoid eating in front of the TV, plan management of cravings, monitor snacking and cravings in food diary;   Hypertension Interventions: Last practice recorded BP readings:  BP Readings from Last 3 Encounters:  05/21/21 134/70  05/15/21 (!) 145/97  04/18/21 124/86  Most recent eGFR/CrCl:  Lab Results  Component Value Date   EGFR 96 04/18/2021    No components found for: CRCL  Evaluation of current treatment plan related to hypertension self management and patient's adherence to plan as established by provider; Reviewed medications with patient and discussed  importance of compliance; Counseled on the importance of exercise goals with target of 150 minutes per week Discussed plans with patient for ongoing care management follow up and provided patient with direct contact information for care management team; Advised patient, providing education and rationale, to monitor blood pressure daily and record, calling PCP for findings outside established parameters;  Provided education on prescribed diet low Sodium;  Discussed complications of poorly controlled blood pressure such as heart disease, stroke, circulatory complications, vision complications, kidney impairment, sexual dysfunction;  New goal. Vit D, 25-Hydroxy 30.0 - 100.0 ng/mL 20.0 Low    Evaluation of current treatment plan related to  Vitamin D deficiency , self-management and patient's adherence to plan as established by provider. Discussed plans with patient for ongoing care management follow up and provided patient with direct contact information for care management team Provided education to patient re: dietary recommendations, getting 15 minutes of natural sunlight daily when possible ; Reviewed medications with patient and discussed dosage, frequency and indication for use of Vitamin D supplementation; Discussed plans with patient for ongoing care management follow up and provided patient with direct contact information for care management team; Assessed social determinant of health barriers;   Diabetes Interventions: Assessed patient's understanding of A1c goal: <7% Provided education to patient about basic DM disease process; Reviewed medications with patient and discussed importance of medication adherence; Discussed plans with patient for ongoing care management follow up and provided patient with direct contact information for care management team; Advised patient, providing education and rationale, to check cbg daily before meals and bedtime and record, calling PCP and or RN Care  Manager for findings outside established parameters; Review of patient status, including review of consultants reports, relevant laboratory and other test results, and medications completed; Lab Results  Component Value Date   HGBA1C 8.0 (H) 04/18/2021   Patient Goals/Self-Care Activities: Patient will self administer medications as prescribed Patient will attend all scheduled provider appointments Patient will call pharmacy for medication refills Patient will call provider office for new concerns or questions  Follow Up Plan:  Telephone follow up appointment with care management team member scheduled for:  06/05/21

## 2021-05-24 ENCOUNTER — Ambulatory Visit: Payer: Medicaid Other | Admitting: Podiatry

## 2021-05-26 ENCOUNTER — Other Ambulatory Visit: Payer: Self-pay | Admitting: Nurse Practitioner

## 2021-05-26 DIAGNOSIS — E1165 Type 2 diabetes mellitus with hyperglycemia: Secondary | ICD-10-CM

## 2021-05-28 LAB — CERVICOVAGINAL ANCILLARY ONLY
Bacterial Vaginitis (gardnerella): NEGATIVE
Candida Glabrata: NEGATIVE
Candida Vaginitis: NEGATIVE
Chlamydia: NEGATIVE
Comment: NEGATIVE
Comment: NEGATIVE
Comment: NEGATIVE
Comment: NEGATIVE
Comment: NEGATIVE
Comment: NORMAL
Neisseria Gonorrhea: NEGATIVE
Trichomonas: NEGATIVE

## 2021-05-30 DIAGNOSIS — F411 Generalized anxiety disorder: Secondary | ICD-10-CM | POA: Diagnosis not present

## 2021-06-04 ENCOUNTER — Ambulatory Visit (INDEPENDENT_AMBULATORY_CARE_PROVIDER_SITE_OTHER): Payer: Medicaid Other | Admitting: Internal Medicine

## 2021-06-04 ENCOUNTER — Encounter: Payer: Self-pay | Admitting: Internal Medicine

## 2021-06-04 ENCOUNTER — Other Ambulatory Visit: Payer: Self-pay

## 2021-06-04 VITALS — BP 138/85 | HR 101 | Ht 66.0 in | Wt 342.6 lb

## 2021-06-04 DIAGNOSIS — Z7189 Other specified counseling: Secondary | ICD-10-CM

## 2021-06-04 NOTE — Patient Instructions (Signed)
Medication Instructions:  Your Physician recommend you continue on your current medication as directed.    *If you need a refill on your cardiac medications before your next appointment, please call your pharmacy*   Lab Work: None ordered today   Testing/Procedures: None ordered today   Follow-Up: At [facility], you and your health needs are our priority.  As part of our continuing mission to provide you with exceptional heart care, we have created designated Provider Care Teams.  These Care Teams include your primary Cardiologist (physician) and Advanced Practice Providers (APPs -  Physician Assistants and Nurse Practitioners) who all work together to provide you with the care you need, when you need it.  We recommend signing up for the patient portal called "MyChart".  Sign up information is provided on this After Visit Summary.  MyChart is used to connect with patients for Virtual Visits (Telemedicine).  Patients are able to view lab/test results, encounter notes, upcoming appointments, etc.  Non-urgent messages can be sent to your provider as well.   To learn more about what you can do with MyChart, go to [email or web address].    Your next appointment:   6 month(s)  The format for your next appointment:   In Person  Provider:   Phineas Inches, MD

## 2021-06-04 NOTE — Progress Notes (Addendum)
Cardiology Office Note:    Date:  06/04/2021   ID:  [name], DOB 1981/07/11, MRN [id number]  PCP:  Minette Brine, FNP   [facility] Providers Cardiologist:  None     Referring MD: Minette Brine, FNP   No chief complaint on file. Abnormal EKG  History of Present Illness:    [name] is a 40 y.o. 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 She notes some anxiety. She is worried about that. She felt an ache around her heart during  a very stressful period.. The episodes had minimal aching briefly. No nausea. No LH, dizziness, no syncope. This was in July 2022. 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 She denies recurrent chest pain. No dyspnea no exertion. No pitting edema. She is planned for a sleep study. Her lipid levels are normal. She does not smoke. Her family history includes her father died of an MI and he had ESRD. Her mother had heart valve surgery. She passed away. Siblings are healthy. She has twins and  another child 11 months apart. She smoked cigarettes for years. She then stopped recently smoking. No etoh use. No drug use. She's a pediatric nurse.   Past Medical History:  Diagnosis Date   Abnormal Pap smear    f/u wnl   Asthma    Fibroid    Gestational diabetes    Hypertension    Morbid obesity ([place])    Sleep apnea    Urinary tract infection     Past Surgical History:  Procedure Laterality Date   CESAREAN SECTION     DILATION AND CURETTAGE OF UTERUS     TUBAL LIGATION      Current Medications: No outpatient medications have been marked as taking for the 06/04/21 encounter (Appointment) with Janina Mayo, MD.     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    Comment: BTL  Other Topics Concern   Not on file  Social History Narrative   Currently [redacted] weeks pregnant with first child (boy) Due 04/16/11         Social Determinants of Health   Financial Resource Strain: High Risk   Difficulty of Paying Living Expenses: Hard  Food Insecurity: Not on file  Transportation Needs: Not on file  Physical Activity: Not on file  Stress: Not on file  Social Connections: Not on file     Family History: The patient's family history includes Allergies in  her mother; Asthma in her mother; Diabetes in her father, maternal grandmother, and mother; Heart disease in her maternal grandmother; Hypertension in her mother. There is no history of Anesthesia problems, Hypotension, Malignant hyperthermia, Pseudochol deficiency, or Hearing loss.  ROS:   Please see the history of present illness.     All other systems reviewed and are negative.  EKGs/Labs/Other Studies Reviewed:    The following studies were reviewed today:   EKG:  EKG is  ordered today.  The ekg ordered today demonstrates   NSR, LAE  Recent Labs: 05/15/2021: ALT 14; BUN 13; Creatinine, Ser 0.69; Hemoglobin 14.5; Platelets 271; Potassium 4.0; Sodium 137  Recent Lipid Panel    Component Value Date/Time   CHOL 153 04/18/2021 1155   TRIG 107 04/18/2021 1155   HDL 38 (L)  04/18/2021 1155   CHOLHDL 4.0 04/18/2021 1155   LDLCALC 95 04/18/2021 1155     Risk Assessment/Calculations:           Physical Exam:    VS:   Vitals:   06/04/21 1611  BP: 138/85  Pulse: (!) 101  SpO2: 98%     Wt Readings from Last 3 Encounters:  05/21/21 (!) 340 lb 3.2 oz (154.3 kg)  05/15/21 (!) 310 lb (140.6 kg)  04/18/21 (!) 332 lb 9.6 oz (150.9 kg)     GEN:  Well nourished, well developed in no acute distress HEENT: Normal NECK: No JVD; No carotid bruits LYMPHATICS: No lymphadenopathy CARDIIAC: RRR, no murmurs, rubs, gallops RESPIRATORY:  Clear to auscultation without rales, wheezing or rhonchi  ABDOMEN: Soft, non-tender, non-distended MUSCULOSKELETAL:  No edema; No deformity  SKIN: Warm and dry NEUROLOGIC:  Alert and oriented x 3 PSYCHIATRIC:  Normal affect   ASSESSMENT:    #Abnormal EKG: Her EKG is benign. She has  no symptoms of CAD or heart failure.  Tachycardia possibly related to anxiety; can continue to monitor. She has no symptoms. She does not have high risk features including syncope c/f arrhythmia , family hx of SCD. Will see her again to determine if anything changes or if symptoms develop. If heart rates normalized and she remains asymptomatic will see as PRN PLAN:    In order of problems listed above:  Follow up 3 months     Medication Adjustments/Labs and Tests Ordered: Current medicines are reviewed at length with the patient today.  Concerns regarding medicines are outlined above.   Signed, Janina Mayo, MD  06/04/2021 7:15 AM    [place]

## 2021-06-05 ENCOUNTER — Other Ambulatory Visit: Payer: Self-pay

## 2021-06-05 ENCOUNTER — Telehealth: Payer: Self-pay

## 2021-06-05 ENCOUNTER — Telehealth: Payer: Medicaid Other

## 2021-06-05 DIAGNOSIS — I10 Essential (primary) hypertension: Secondary | ICD-10-CM

## 2021-06-05 MED ORDER — LOSARTAN POTASSIUM-HCTZ 50-12.5 MG PO TABS
1.0000 | ORAL_TABLET | Freq: Every day | ORAL | 1 refills | Status: DC
Start: 2021-06-05 — End: 2021-12-02

## 2021-06-05 NOTE — Telephone Encounter (Signed)
  Care Management   Follow Up Note   06/05/2021 Name: [name] MRN: [id number] DOB: November 08, 1980   Referred by: Minette Brine, FNP Reason for referral : Chronic Care Management (RN CM Follow up call - 2nd attempt )   A second unsuccessful telephone outreach was attempted today. The patient was referred to the case management team for assistance with care management and care coordination. I spoke with [name] briefly today, unfortunately she is not available to speak with me due to being at work. She is agreeable to call back on Friday, 06/07/21.   Follow Up Plan: Telephone follow up appointment with care management team member scheduled for: 06/07/21  Barb Merino, RN, BSN, CCM Care Management Coordinator [place] [facility]  Direct Phone: [phone]

## 2021-06-07 ENCOUNTER — Ambulatory Visit: Payer: Self-pay

## 2021-06-07 ENCOUNTER — Telehealth: Payer: Medicaid Other

## 2021-06-07 DIAGNOSIS — Z6841 Body Mass Index (BMI) 40.0 and over, adult: Secondary | ICD-10-CM

## 2021-06-07 DIAGNOSIS — E782 Mixed hyperlipidemia: Secondary | ICD-10-CM

## 2021-06-07 DIAGNOSIS — E559 Vitamin D deficiency, unspecified: Secondary | ICD-10-CM

## 2021-06-07 DIAGNOSIS — I10 Essential (primary) hypertension: Secondary | ICD-10-CM

## 2021-06-07 DIAGNOSIS — E1165 Type 2 diabetes mellitus with hyperglycemia: Secondary | ICD-10-CM

## 2021-06-10 NOTE — Chronic Care Management (AMB) (Signed)
Chronic Care Management   CCM RN Visit Note  06/07/2021 Name: [name] MRN: [id number] DOB: 06-Jun-1981  Subjective: [name] is a 40 y.o. year old female who is a primary care patient of Minette Brine, [place]. The care management team was consulted for assistance with disease management and care coordination needs.    Engaged with patient by telephone for follow up visit in response to provider referral for case management and/or care coordination services.   Consent to Services:  The patient was given information about Chronic Care Management services, agreed to services, and gave verbal consent prior to initiation of services.  Please see initial visit note for detailed documentation.   Patient agreed to services and verbal consent obtained.   Assessment: Review of patient past medical history, allergies, medications, health status, including review of consultants reports, laboratory and other test data, was performed as part of comprehensive evaluation and provision of chronic care management services.   SDOH (Social Determinants of Health) assessments and interventions performed:  Yes, no acute needs   CCM Care Plan  Allergies  Allergen Reactions   Other Anaphylaxis and Other (See Comments)    Pt has fish allergy Pt has allergy to butter cookies   Penicillins Rash and Other (See Comments)    Told MD that PCN reaction is rash and that she has taken amoxicillin before   Bactrim [Sulfamethoxazole-Trimethoprim]     Diarrhea, abdominal cramps and rash    Outpatient Encounter Medications as of 06/07/2021  Medication Sig Note   albuterol (PROVENTIL) (5 MG/ML) 0.5% nebulizer solution Take 0.5 mLs (2.5 mg total) by nebulization every 6 (six) hours as needed for wheezing or shortness of breath.    albuterol (VENTOLIN HFA) 108 (90 Base) MCG/ACT inhaler Inhale 1-2 puffs into the lungs every 6 (six) hours as needed for wheezing or shortness of breath.    atorvastatin (LIPITOR) 20  MG tablet TAKE 1 TABLET(20 MG) BY MOUTH DAILY    blood glucose meter kit and supplies KIT Dispense based on patient and insurance preference. Use up to four times daily as directed. (FOR ICD-9 250.00, 250.01).    buPROPion (WELLBUTRIN XL) 300 MG 24 hr tablet TAKE 1 TABLET(300 MG) BY MOUTH DAILY    cetirizine (ZYRTEC ALLERGY) 10 MG tablet Take 1 tablet (10 mg total) by mouth daily.    dapagliflozin propanediol (FARXIGA) 10 MG TABS tablet Take 1 tablet (10 mg total) by mouth daily.    diclofenac (VOLTAREN) 50 MG EC tablet Take 1 tablet (50 mg total) by mouth 3 (three) times daily.    fluconazole (DIFLUCAN) 150 MG tablet Take 1 tablet (150 mg total) by mouth daily.    fluticasone (FLONASE) 50 MCG/ACT nasal spray Place 1-2 sprays into both nostrils daily.    glucose blood test strip USE TO CHECK BLOOD SUGARS UP TO 4 TIMES DAILY  DX CODE:E11.9    ibuprofen (ADVIL) 800 MG tablet Take 800 mg by mouth 3 (three) times daily. 08/30/2020: Was taking this medicine because she needed her tooth pulled    insulin degludec (TRESIBA FLEXTOUCH) 100 UNIT/ML FlexTouch Pen Inject 45 Units into the skin daily.    Insulin Pen Needle 31G X 8 MM MISC 1 Device by Does not apply route daily.    Lancets (ACCU-CHEK MULTICLIX) lancets USE TO CHECK BLOOD SUGARS UP TO 4 TIMES DAILY  DX CODE:E11.9    levonorgestrel (MIRENA) 20 MCG/24HR IUD 1 each by Intrauterine route once.    losartan-hydrochlorothiazide (HYZAAR) 50-12.5 MG  tablet Take 1 tablet by mouth daily.    metFORMIN (GLUCOPHAGE-XR) 750 MG 24 hr tablet TAKE 1 TABLET(750 MG) BY MOUTH IN THE MORNING AND AT BEDTIME    metroNIDAZOLE (FLAGYL) 500 MG tablet Take 1 tablet (500 mg total) by mouth 2 (two) times daily.    propranolol (INDERAL) 10 MG tablet TAKE 1 TABLET(10 MG) BY MOUTH EVERY EVENING    Semaglutide, 1 MG/DOSE, (OZEMPIC, 1 MG/DOSE,) 4 MG/3ML SOPN Inject 1 mg into the skin once a week.    triamcinolone (KENALOG) 0.025 % ointment Apply 1 application topically 2 (two)  times daily.    Vitamin D, Ergocalciferol, (DRISDOL) 1.25 MG (50000 UNIT) CAPS capsule Take 1 capsule by mouth on Tues and on Fridays.    No facility-administered encounter medications on file as of 06/07/2021.    Patient Active Problem List   Diagnosis Date Noted   Dyslipidemia 11/30/2020   Weight gain 11/30/2020   Class 3 severe obesity due to excess calories with serious comorbidity and body mass index (BMI) of 50.0 to 59.9 in adult ([place]) 05/19/2019   Anemia 12/21/2018   Uncontrolled type 2 diabetes mellitus with hyperglycemia ([place]) 12/21/2018   Anxiety 07/19/2018   Depression 07/19/2018   Essential hypertension 07/19/2018   Fibroid uterus 09/17/2015   Menorrhagia 08/27/2015   Type 2 diabetes mellitus without complication, with long-term current use of insulin ([place]) 09/13/2012   BMI 60.0-69.9, adult ([place]) 09/13/2012   Asthma 09/13/2012   History of high blood pressure 09/13/2012   OSA (obstructive sleep apnea) 12/10/2010    Conditions to be addressed/monitored: DMII, HTN, Class 3 Obesity, Vitamin D deficiency  Care Plan : RN Care Manager Plan of Care  Updates made by Lynne Logan, RN since 06/07/2021 12:00 AM     Problem: Chronic disease eduction and Care Coordination needs for DMII, HTN, Class 3 Obesity, Vitamin D deficiency   Priority: High     Long-Range Goal: Assist with Chronic disease education and Care Coordination needs for DMII, HTN, Class 3 Obesity, Vitamin D deficiency   Start Date: 05/09/2021  Expected End Date: 05/09/2022  Recent Progress: On track  Priority: High  Note:   Current Barriers:  Knowledge Deficits related to plan of care for management of HTN, DMII, and Class 3 Obesity, Vitamin D deficiency Chronic Disease Management support and education needs related to HTN, DMII, and Class 3 Obesity  RNCM Clinical Goal(s):  Patient will demonstrate Ongoing health management independence   continue to work with RN Care Manager to address care management and  care coordination needs related to  HTN, DMII, and Class III Obesity, Vitamin D deficiency  through collaboration with RN Care manager, provider, and care team.   Interventions: 1:1 collaboration with primary care provider regarding development and update of comprehensive plan of care as evidenced by provider attestation and co-signature Inter-disciplinary care team collaboration (see longitudinal plan of care) Evaluation of current treatment plan related to  self management and patient's adherence to plan as established by provider  Weight Loss Interventions:    Body mass index is 52.65 kg/m.     Wt Readings from Last 3 Encounters:  05/21/21 (!) 340 lb 3.2 oz (154.3 kg)  05/15/21 (!) 310 lb (140.6 kg)  04/18/21 (!) 332 lb 9.6 oz (150.9 kg)  Advised patient to discuss with primary care provider options regarding weight management;  Provided verbal and/or written education to patient re: provider recommended life style modifications;  Reviewed recommended dietary changes: avoid fad diets, make small/incremental  dietary and exercise changes, eat at the table and avoid eating in front of the TV, plan management of cravings, monitor snacking and cravings in food diary;   Hypertension Interventions: Last practice recorded BP readings:  BP Readings from Last 3 Encounters:  05/21/21 134/70  05/15/21 (!) 145/97  04/18/21 124/86  Most recent eGFR/CrCl:  Lab Results  Component Value Date   EGFR 96 04/18/2021    No components found for: CRCL  Evaluation of current treatment plan related to hypertension self management and patient's adherence to plan as established by provider; Reviewed medications with patient and discussed importance of compliance; Counseled on the importance of exercise goals with target of 150 minutes per week Discussed plans with patient for ongoing care management follow up and provided patient with direct contact information for care management team; Advised patient,  providing education and rationale, to monitor blood pressure daily and record, calling PCP for findings outside established parameters;  Provided education on prescribed diet low Sodium;  Discussed complications of poorly controlled blood pressure such as heart disease, stroke, circulatory complications, vision complications, kidney impairment, sexual dysfunction;  06/07/21 completed successful outbound call with patient  Discussed patient completed a follow up with Cardiology for evaluation of abnormal EKG Review of patient status, including review of consultant's reports, relevant laboratory and other test results, and medications completed. Assessed for knowledge deficits related to recommendations for Cardiac follow up; determined patient will follow up with Cardiology in 6 months  Educated patient on cardiac signs/symptoms and when to call the doctor if symptoms occur  Discussed plans with patient for ongoing care management follow up and provided patient with direct contact information for care management team  New goal. Vit D, 25-Hydroxy 30.0 - 100.0 ng/mL 20.0 Low    Evaluation of current treatment plan related to  Vitamin D deficiency , self-management and patient's adherence to plan as established by provider. Discussed plans with patient for ongoing care management follow up and provided patient with direct contact information for care management team Provided education to patient re: dietary recommendations, getting 15 minutes of natural sunlight daily when possible ; Reviewed medications with patient and discussed dosage, frequency and indication for use of Vitamin D supplementation; Discussed plans with patient for ongoing care management follow up and provided patient with direct contact information for care management team; Assessed social determinant of health barriers;   Diabetes Interventions: Assessed patient's understanding of A1c goal: <7% Provided education to patient about  basic DM disease process; Reviewed medications with patient and discussed importance of medication adherence; Counseled on importance of regular laboratory monitoring as prescribed; Discussed plans with patient for ongoing care management follow up and provided patient with direct contact information for care management team; Provided patient with written educational materials related to hypo and hyperglycemia and importance of correct treatment; Reviewed scheduled/upcoming provider appointments including: PCP follow up scheduled for 08/21/21 _0 :40 AM; Advised patient, providing education and rationale, to check cbg daily before meals and bedtime and record, calling PCP and or [place] for findings outside established parameters; Review of patient status, including review of consultants reports, relevant laboratory and other test results, and medications completed; Assessed social determinant of health barriers;  Lab Results  Component Value Date   HGBA1C 8.0 (H) 04/18/2021   Patient Goals/Self-Care Activities: Patient will self administer medications as prescribed Patient will attend all scheduled provider appointments Patient will call pharmacy for medication refills Patient will call provider office for new concerns or questions  Follow  Up Plan:  Telephone follow up appointment with care management team member scheduled for:  08/23/21      Plan:Telephone follow up appointment with care management team member scheduled for:  08/23/21  Barb Merino, RN, BSN, CCM Care Management Coordinator [place] [facility]  Direct Phone: [phone]

## 2021-06-10 NOTE — Patient Instructions (Signed)
Visit Information   PATIENT GOALS/PLAN OF CARE:  Care Plan : RN Care Manager Plan of Care  Updates made by Lynne Logan, RN since 06/07/2021 12:00 AM     Problem: Chronic disease eduction and Care Coordination needs for DMII, HTN, Class 3 Obesity, Vitamin D deficiency   Priority: High     Long-Range Goal: Assist with Chronic disease education and Care Coordination needs for DMII, HTN, Class 3 Obesity, Vitamin D deficiency   Start Date: 05/09/2021  Expected End Date: 05/09/2022  Recent Progress: On track  Priority: High  Note:   Current Barriers:  Knowledge Deficits related to plan of care for management of HTN, DMII, and Class 3 Obesity, Vitamin D deficiency Chronic Disease Management support and education needs related to HTN, DMII, and Class 3 Obesity  RNCM Clinical Goal(s):  Patient will demonstrate Ongoing health management independence   continue to work with RN Care Manager to address care management and care coordination needs related to  HTN, DMII, and Class III Obesity, Vitamin D deficiency  through collaboration with RN Care manager, provider, and care team.   Interventions: 1:1 collaboration with primary care provider regarding development and update of comprehensive plan of care as evidenced by provider attestation and co-signature Inter-disciplinary care team collaboration (see longitudinal plan of care) Evaluation of current treatment plan related to  self management and patient's adherence to plan as established by provider  Weight Loss Interventions:    Body mass index is 52.65 kg/m.     Wt Readings from Last 3 Encounters:  05/21/21 (!) 340 lb 3.2 oz (154.3 kg)  05/15/21 (!) 310 lb (140.6 kg)  04/18/21 (!) 332 lb 9.6 oz (150.9 kg)  Advised patient to discuss with primary care provider options regarding weight management;  Provided verbal and/or written education to patient re: provider recommended life style modifications;  Reviewed recommended dietary  changes: avoid fad diets, make small/incremental dietary and exercise changes, eat at the table and avoid eating in front of the TV, plan management of cravings, monitor snacking and cravings in food diary;   Hypertension Interventions: Last practice recorded BP readings:  BP Readings from Last 3 Encounters:  05/21/21 134/70  05/15/21 (!) 145/97  04/18/21 124/86  Most recent eGFR/CrCl:  Lab Results  Component Value Date   EGFR 96 04/18/2021    No components found for: CRCL  Evaluation of current treatment plan related to hypertension self management and patient's adherence to plan as established by provider; Reviewed medications with patient and discussed importance of compliance; Counseled on the importance of exercise goals with target of 150 minutes per week Discussed plans with patient for ongoing care management follow up and provided patient with direct contact information for care management team; Advised patient, providing education and rationale, to monitor blood pressure daily and record, calling PCP for findings outside established parameters;  Provided education on prescribed diet low Sodium;  Discussed complications of poorly controlled blood pressure such as heart disease, stroke, circulatory complications, vision complications, kidney impairment, sexual dysfunction;  06/07/21 completed successful outbound call with patient  Discussed patient completed a follow up with Cardiology for evaluation of abnormal EKG Review of patient status, including review of consultant's reports, relevant laboratory and other test results, and medications completed. Assessed for knowledge deficits related to recommendations for Cardiac follow up; determined patient will follow up with Cardiology in 6 months  Educated patient on cardiac signs/symptoms and when to call the doctor if symptoms occur  Discussed plans with patient  for ongoing care management follow up and provided patient with direct  contact information for care management team  New goal. Vit D, 25-Hydroxy 30.0 - 100.0 ng/mL 20.0 Low    Evaluation of current treatment plan related to  Vitamin D deficiency , self-management and patient's adherence to plan as established by provider. Discussed plans with patient for ongoing care management follow up and provided patient with direct contact information for care management team Provided education to patient re: dietary recommendations, getting 15 minutes of natural sunlight daily when possible ; Reviewed medications with patient and discussed dosage, frequency and indication for use of Vitamin D supplementation; Discussed plans with patient for ongoing care management follow up and provided patient with direct contact information for care management team; Assessed social determinant of health barriers;   Diabetes Interventions: Assessed patient's understanding of A1c goal: <7% Provided education to patient about basic DM disease process; Reviewed medications with patient and discussed importance of medication adherence; Counseled on importance of regular laboratory monitoring as prescribed; Discussed plans with patient for ongoing care management follow up and provided patient with direct contact information for care management team; Provided patient with written educational materials related to hypo and hyperglycemia and importance of correct treatment; Reviewed scheduled/upcoming provider appointments including: PCP follow up scheduled for 08/21/21 '@10' :40 AM; Advised patient, providing education and rationale, to check cbg daily before meals and bedtime and record, calling PCP and or [place] for findings outside established parameters; Review of patient status, including review of consultants reports, relevant laboratory and other test results, and medications completed; Assessed social determinant of health barriers;  Lab Results  Component Value Date   HGBA1C 8.0  (H) 04/18/2021   Patient Goals/Self-Care Activities: Patient will self administer medications as prescribed Patient will attend all scheduled provider appointments Patient will call pharmacy for medication refills Patient will call provider office for new concerns or questions  Follow Up Plan:  Telephone follow up appointment with care management team member scheduled for:  08/23/21       Consent to CCM Services: Ms. Baumert was given information about Chronic Care Management services including:  CCM service includes personalized support from designated clinical staff supervised by her physician, including individualized plan of care and coordination with other care providers 24/7 contact phone numbers for assistance for urgent and routine care needs. Service will only be billed when office clinical staff spend 20 minutes or more in a month to coordinate care. Only one practitioner may furnish and bill the service in a calendar month. The patient may stop CCM services at any time (effective at the end of the month) by phone call to the office staff. The patient will be responsible for cost sharing (co-pay) of up to 20% of the service fee (after annual deductible is met).  Patient agreed to services and verbal consent obtained.   The patient verbalized understanding of instructions, educational materials, and care plan provided today and declined offer to receive copy of patient instructions, educational materials, and care plan.   Telephone follow up appointment with care management team member scheduled for: 08/23/21   Barb Merino, RN, BSN, CCM Care Management Coordinator [place] [facility]  Direct Phone: [phone]

## 2021-06-11 DIAGNOSIS — F411 Generalized anxiety disorder: Secondary | ICD-10-CM | POA: Diagnosis not present

## 2021-06-13 ENCOUNTER — Other Ambulatory Visit: Payer: Self-pay | Admitting: Nurse Practitioner

## 2021-06-13 DIAGNOSIS — E1165 Type 2 diabetes mellitus with hyperglycemia: Secondary | ICD-10-CM

## 2021-06-20 DIAGNOSIS — F411 Generalized anxiety disorder: Secondary | ICD-10-CM | POA: Diagnosis not present

## 2021-07-15 ENCOUNTER — Ambulatory Visit: Payer: Medicaid Other | Admitting: Podiatry

## 2021-07-15 ENCOUNTER — Encounter: Payer: Self-pay | Admitting: Podiatry

## 2021-07-15 ENCOUNTER — Other Ambulatory Visit: Payer: Self-pay

## 2021-07-15 DIAGNOSIS — L84 Corns and callosities: Secondary | ICD-10-CM | POA: Diagnosis not present

## 2021-07-15 DIAGNOSIS — M2142 Flat foot [pes planus] (acquired), left foot: Secondary | ICD-10-CM

## 2021-07-15 DIAGNOSIS — E1165 Type 2 diabetes mellitus with hyperglycemia: Secondary | ICD-10-CM

## 2021-07-15 DIAGNOSIS — M2141 Flat foot [pes planus] (acquired), right foot: Secondary | ICD-10-CM

## 2021-07-16 ENCOUNTER — Ambulatory Visit: Payer: Self-pay | Admitting: Clinical

## 2021-07-16 DIAGNOSIS — F411 Generalized anxiety disorder: Secondary | ICD-10-CM | POA: Diagnosis not present

## 2021-07-16 NOTE — Progress Notes (Signed)
Late cancellation. Client did not present for appointment. Upon phone call, disclosed that she has found services with a different therapist and no longer needs therapy.

## 2021-07-17 NOTE — Progress Notes (Signed)
Subjective: 40 year old female presents the office today for follow-up evaluation of a painful callus to her big toe joint, medial aspect.  She states that the toe does hurt and the calluses become very thickened tender.  No swelling redness or any drainage.  She said that she been working on her A1c and is come down to eat which was done in September 2022.  No recent injury or changes otherwise.  Objective: AAO x3, NAD DP/PT pulses palpable bilaterally, CRT less than 3 seconds Hyperkeratotic lesion medial hallux and there is some dried blood present however there is no underlying ulceration drainage or any signs of infection noted today.  Minimal hyperkeratotic tissue on the right medial nail border.  No open lesions identified today. Pronation is present resulting likely the hyperkeratotic lesions. No pain with calf compression, swelling, warmth, erythema  Assessment: Preulcerative skin lesion  Plan: -All treatment options discussed with the patient including all alternatives, risks, complications.  -Debrided hyperkeratotic lesion with any complications or bleeding as a courtesy but we discussed measures to help prevent callus from coming back as much.  Discussed shoes and good arch support.  Use moisturizer and offloading.  Monitoring signs or symptoms of infection.  Discussed daily foot inspection continue to encourage glucose control.  Trula Slade DPM

## 2021-07-18 ENCOUNTER — Institutional Professional Consult (permissible substitution): Payer: Medicaid Other | Admitting: Neurology

## 2021-08-08 ENCOUNTER — Ambulatory Visit
Admission: EM | Admit: 2021-08-08 | Discharge: 2021-08-08 | Disposition: A | Discharge status: Home / Self Care | Payer: Medicaid Other | Attending: Internal Medicine | Admitting: Internal Medicine

## 2021-08-08 ENCOUNTER — Other Ambulatory Visit: Payer: Self-pay

## 2021-08-08 ENCOUNTER — Encounter: Payer: Self-pay | Admitting: Emergency Medicine

## 2021-08-08 DIAGNOSIS — J069 Acute upper respiratory infection, unspecified: Secondary | ICD-10-CM

## 2021-08-08 DIAGNOSIS — R0602 Shortness of breath: Secondary | ICD-10-CM

## 2021-08-08 DIAGNOSIS — Z20822 Contact with and (suspected) exposure to covid-19: Secondary | ICD-10-CM | POA: Diagnosis not present

## 2021-08-08 LAB — POCT INFLUENZA A/B
Influenza A, POC: NEGATIVE
Influenza B, POC: NEGATIVE

## 2021-08-08 MED ORDER — BENZONATATE 100 MG PO CAPS: 100.0000 mg | ORAL_CAPSULE | Freq: Three times a day (TID) | ORAL | 0 refills | Status: DC | PRN

## 2021-08-08 MED ORDER — AZITHROMYCIN 500 MG PO TABS: ORAL_TABLET | ORAL | 0 refills | Status: AC

## 2021-08-08 MED ORDER — PREDNISONE 20 MG PO TABS: 40.0000 mg | ORAL_TABLET | Freq: Every day | ORAL | 0 refills | Status: AC

## 2021-08-08 NOTE — ED Provider Notes (Addendum)
[facility]    CSN: [id number] Arrival date & time: 08/08/21  6759      History   Chief Complaint Chief Complaint  Patient presents with   Cough   Shortness of Breath    HPI [name] is a 41 y.o. female.   Patient presents with cough, shortness of breath, nausea, generalized body aches, nasal congestion, chest tightness that started 2 days ago.  Patient reports that she has a history of asthma and has been using her albuterol inhaler with no improvement.  Cough is productive at times and dry at times.  Denies any known fevers.  Her family members have similar symptoms currently.  She also took a prednisone tablet that she had leftover that showed improvement in her symptoms.  Denies chest pain, sore throat, ear pain, vomiting, diarrhea, abdominal pain.  Is also taking Mucinex, Coricidin, ibuprofen, NyQuil, DayQuil with no improvement in symptoms.   Cough Shortness of Breath  Past Medical History:  Diagnosis Date   Abnormal Pap smear    f/u wnl   Asthma    Fibroid    Gestational diabetes    Hypertension    Morbid obesity ([place])    Sleep apnea    Urinary tract infection     Patient Active Problem List   Diagnosis Date Noted   Dyslipidemia 11/30/2020   Weight gain 11/30/2020   Class 3 severe obesity due to excess calories with serious comorbidity and body mass index (BMI) of 50.0 to 59.9 in adult ([place]) 05/19/2019   Anemia 12/21/2018   Uncontrolled type 2 diabetes mellitus with hyperglycemia ([place]) 12/21/2018   Anxiety 07/19/2018   Depression 07/19/2018   Essential hypertension 07/19/2018   Fibroid uterus 09/17/2015   Menorrhagia 08/27/2015   Type 2 diabetes mellitus without complication, with long-term current use of insulin ([place]) 09/13/2012   BMI 60.0-69.9, adult ([place]) 09/13/2012   Asthma 09/13/2012   History of high blood pressure 09/13/2012   OSA (obstructive sleep apnea) 12/10/2010    Past Surgical History:  Procedure Laterality Date    CESAREAN SECTION     DILATION AND CURETTAGE OF UTERUS     TUBAL LIGATION      OB History     Gravida  4   Para  2   Term  1   Preterm  1   AB  2   Living  3      SAB  2   IAB  0   Ectopic  0   Multiple  1   Live Births  3            Home Medications    Prior to Admission medications   Medication Sig Start Date End Date Taking? Authorizing Provider  azithromycin (ZITHROMAX) 500 MG tablet Take 1 tablet (500 mg total) by mouth daily for 1 day, THEN 0.5 tablets (250 mg total) daily for 4 days. 08/08/21 08/13/21 Yes Digna Countess, Michele Rockers, FNP  benzonatate (TESSALON) 100 MG capsule Take 1 capsule (100 mg total) by mouth every 8 (eight) hours as needed for cough. 08/08/21  Yes Nazareth Norenberg, Hildred Alamin E, FNP  predniSONE (DELTASONE) 20 MG tablet Take 2 tablets (40 mg total) by mouth daily for 5 days. 08/08/21 08/13/21 Yes Ryonna Cimini, Michele Rockers, FNP  albuterol (PROVENTIL) (5 MG/ML) 0.5% nebulizer solution Take 0.5 mLs (2.5 mg total) by nebulization every 6 (six) hours as needed for wheezing or shortness of breath. 04/15/19   Zigmund Gottron, NP  albuterol (VENTOLIN HFA) 108 (  90 Base) MCG/ACT inhaler Inhale 1-2 puffs into the lungs every 6 (six) hours as needed for wheezing or shortness of breath. 07/23/20   Wieters, Hallie C, [name]  atorvastatin (LIPITOR) 20 MG tablet TAKE 1 TABLET(20 MG) BY MOUTH DAILY 04/26/21   Minette Brine, FNP  blood glucose meter kit and supplies KIT Dispense based on patient and insurance preference. Use up to four times daily as directed. (FOR ICD-9 250.00, 250.01). 07/23/20   Wieters, Hallie C, [name]  buPROPion (WELLBUTRIN XL) 300 MG 24 hr tablet TAKE 1 TABLET(300 MG) BY MOUTH DAILY 04/18/21   Bary Castilla, NP  cetirizine (ZYRTEC ALLERGY) 10 MG tablet Take 1 tablet (10 mg total) by mouth daily. 04/26/21   Bary Castilla, NP  dapagliflozin propanediol (FARXIGA) 10 MG TABS tablet Take 1 tablet (10 mg total) by mouth daily. 11/30/20   Shamleffer, Melanie Crazier, MD  diclofenac  (VOLTAREN) 50 MG EC tablet Take 1 tablet (50 mg total) by mouth 3 (three) times daily. 07/11/19   Woodroe Mode, MD  FARXIGA 5 MG TABS tablet TAKE 1 TABLET(5 MG) BY MOUTH DAILY BEFORE BREAKFAST 06/14/21   Minette Brine, FNP  ferrous sulfate 325 (65 FE) MG EC tablet Take 1 tablet (325 mg total) by mouth 3 (three) times daily with meals. 10/31/19 06/04/21  Rodriguez-Southworth, Sunday Spillers, [name]  fluconazole (DIFLUCAN) 150 MG tablet Take 1 tablet (150 mg total) by mouth daily. 05/21/21   Minette Brine, FNP  fluticasone (FLONASE) 50 MCG/ACT nasal spray Place 1-2 sprays into both nostrils daily. 04/18/21   Ghumman, Ramandeep, NP  glucose blood test strip USE TO CHECK BLOOD SUGARS UP TO 4 TIMES DAILY  DX CODE:E11.9 05/05/19   Rodriguez-Southworth, Sunday Spillers, [name]  ibuprofen (ADVIL) 800 MG tablet Take 800 mg by mouth 3 (three) times daily. 07/11/20   [provider]  insulin degludec (TRESIBA FLEXTOUCH) 100 UNIT/ML FlexTouch Pen Inject 45 Units into the skin daily. 04/18/21   Ghumman, Ramandeep, NP  Insulin Pen Needle 31G X 8 MM MISC 1 Device by Does not apply route daily. 11/30/20   Shamleffer, Melanie Crazier, MD  Lancets (ACCU-CHEK MULTICLIX) lancets USE TO CHECK BLOOD SUGARS UP TO 4 TIMES DAILY  DX CODE:E11.9 05/05/19   Rodriguez-Southworth, Sunday Spillers, [name]  levonorgestrel (MIRENA) 20 MCG/24HR IUD 1 each by Intrauterine route once.    [provider]  losartan-hydrochlorothiazide (HYZAAR) 50-12.5 MG tablet Take 1 tablet by mouth daily. 06/05/21 09/03/21  Minette Brine, FNP  metFORMIN (GLUCOPHAGE-XR) 750 MG 24 hr tablet TAKE 1 TABLET(750 MG) BY MOUTH IN THE MORNING AND AT BEDTIME 06/04/20   Minette Brine, FNP  metroNIDAZOLE (FLAGYL) 500 MG tablet Take 1 tablet (500 mg total) by mouth 2 (two) times daily. 05/15/21   Tedd Sias, PA  propranolol (INDERAL) 10 MG tablet TAKE 1 TABLET(10 MG) BY MOUTH EVERY EVENING 01/22/21   Minette Brine, FNP  Semaglutide, 1 MG/DOSE, (OZEMPIC, 1 MG/DOSE,) 4 MG/3ML SOPN  Inject 1 mg into the skin once a week. 04/18/21   Ghumman, Ramandeep, NP  triamcinolone (KENALOG) 0.025 % ointment Apply 1 application topically 2 (two) times daily. 04/18/21   Bary Castilla, NP  Vitamin D, Ergocalciferol, (DRISDOL) 1.25 MG (50000 UNIT) CAPS capsule Take 1 capsule by mouth on Tues and on Fridays. 04/23/21   Bary Castilla, NP    Family History Family History  Problem Relation Age of Onset   Diabetes Father    Allergies Mother    Asthma Mother    Hypertension Mother    Diabetes Mother  Diabetes Maternal Grandmother    Heart disease Maternal Grandmother    Anesthesia problems Neg Hx    Hypotension Neg Hx    Malignant hyperthermia Neg Hx    Pseudochol deficiency Neg Hx    Hearing loss Neg Hx     Social History Social History   Tobacco Use   Smoking status: Former    Types: Cigarettes    Quit date: 08/08/2019    Years since quitting: 2.0   Smokeless tobacco: Never  Vaping Use   Vaping Use: Never used  Substance Use Topics   Alcohol use: No    Comment: occasional   Drug use: No     Allergies   Other, Penicillins, Bactrim [sulfamethoxazole-trimethoprim], and Fish allergy   Review of Systems Review of Systems Per HPI  Physical Exam Triage Vital Signs ED Triage Vitals [08/08/21 0903]  Enc Vitals Group     BP (!) 134/92     Pulse Rate 90     Resp 16     Temp 98.5 F (36.9 C)     Temp Source Oral     SpO2 95 %     Weight      Height      Head Circumference      Peak Flow      Pain Score      Pain Loc      Pain Edu?      Excl. in [place]?    No data found.  Updated Vital Signs BP (!) 134/92 (BP Location: Left Arm)    Pulse 90    Temp 98.5 F (36.9 C) (Oral)    Resp 16    SpO2 95%   Visual Acuity Right Eye Distance:   Left Eye Distance:   Bilateral Distance:    Right Eye Near:   Left Eye Near:    Bilateral Near:     Physical Exam Constitutional:      General: She is not in acute distress.    Appearance: Normal appearance. She  is not toxic-appearing or diaphoretic.  HENT:     Head: Normocephalic and atraumatic.     Right Ear: Tympanic membrane and ear canal normal.     Left Ear: Ear canal normal. Tympanic membrane is erythematous. Tympanic membrane is not perforated or bulging.     Nose: Congestion present.     Mouth/Throat:     Mouth: Mucous membranes are moist.     Pharynx: No posterior oropharyngeal erythema.  Eyes:     Extraocular Movements: Extraocular movements intact.     Conjunctiva/sclera: Conjunctivae normal.     Pupils: Pupils are equal, round, and reactive to light.  Cardiovascular:     Rate and Rhythm: Normal rate and regular rhythm.     Pulses: Normal pulses.     Heart sounds: Normal heart sounds.  Pulmonary:     Effort: Pulmonary effort is normal. No respiratory distress.     Breath sounds: Normal breath sounds. No stridor. No wheezing, rhonchi or rales.  Abdominal:     General: Abdomen is flat. Bowel sounds are normal.     Palpations: Abdomen is soft.  Musculoskeletal:        General: Normal range of motion.     Cervical back: Normal range of motion.  Skin:    General: Skin is warm and dry.  Neurological:     General: No focal deficit present.     Mental Status: She is alert and oriented to person, place, and time.  Mental status is at baseline.  Psychiatric:        Mood and Affect: Mood normal.        Behavior: Behavior normal.     UC Treatments / Results  Labs (all labs ordered are listed, but only abnormal results are displayed) Labs Reviewed  NOVEL CORONAVIRUS, NAA  POCT INFLUENZA A/B    EKG   Radiology No results found.  Procedures Procedures (including critical care time)  Medications Ordered in UC Medications - No data to display  Initial Impression / Assessment and Plan / UC Course  I have reviewed the triage vital signs and the nursing notes.  Pertinent labs & imaging results that were available during my care of the patient were reviewed by me and  considered in my medical decision making (see chart for details).     Patient presents with symptoms likely from a viral upper respiratory infection. Differential includes bacterial pneumonia, sinusitis, allergic rhinitis, COVID-19, flu. Do not suspect underlying cardiopulmonary process. Symptoms seem unlikely related to ACS, CHF or COPD exacerbations, pneumonia, pneumothorax. Patient is nontoxic appearing and not in need of emergent medical intervention.  Rapid flu was negative and COVID 19 test is pending.  Recommended symptom control with over the counter medications.  It appears the patient may be having an asthma exacerbation given that albuterol inhaler is not being helpful and patient is still having shortness of breath.  Will prescribe prednisone steroid as patient reports that she has previously taken this before and her blood glucose tolerates this well.  Patient to monitor blood glucose while on prednisone.  Azithromycin also to treat left ear infection.  Do not think that chest imaging is necessary given duration of symptoms and that there is no adventitious lung sounds or signs of respiratory compromise on exam.  Return if symptoms fail to improve in 1-2 weeks. Patient states understanding and is agreeable.  Discharged with PCP followup.  Final Clinical Impressions(s) / UC Diagnoses   Final diagnoses:  Viral upper respiratory tract infection with cough  Shortness of breath     Discharge Instructions      It appears that you have a viral upper respiratory infection that is causing an exacerbation of your asthma.  Your rapid flu was negative.  COVID-19 test is pending.  We will call if it is positive.  You have been prescribed prednisone steroid, cough medication, azithromycin antibiotic.  The antibiotic will treat your left ear infection.  Please monitor blood sugars very closely while on prednisone.       ED Prescriptions     Medication Sig Dispense Auth. Provider    azithromycin (ZITHROMAX) 500 MG tablet Take 1 tablet (500 mg total) by mouth daily for 1 day, THEN 0.5 tablets (250 mg total) daily for 4 days. 3 tablet Brady, [place] E, [place]   predniSONE (DELTASONE) 20 MG tablet Take 2 tablets (40 mg total) by mouth daily for 5 days. 10 tablet Morgan, [place] E, [place]   benzonatate (TESSALON) 100 MG capsule Take 1 capsule (100 mg total) by mouth every 8 (eight) hours as needed for cough. 21 capsule [place], Michele Rockers, [place]      PDMP not reviewed this encounter.   Teodora Medici, [place] 08/08/21 [place], [place],  08/08/21 [phone]

## 2021-08-08 NOTE — Discharge Instructions (Addendum)
It appears that you have a viral upper respiratory infection that is causing an exacerbation of your asthma.  Your rapid flu was negative.  COVID-19 test is pending.  We will call if it is positive.  You have been prescribed prednisone steroid, cough medication, azithromycin antibiotic.  The antibiotic will treat your left ear infection.  Please monitor blood sugars very closely while on prednisone.

## 2021-08-08 NOTE — ED Triage Notes (Signed)
Hx of asthma. Productive cough, shortness of breath, nausea, generalized body aches, chest tightness starting x 2 days ago. Has been using albuterol inhaler without relief. Mucinex, corcidin, ibuprofen, nyquil, and dayquil have not improved symptoms. Denies fever, V/D. Took a prednisone tablet the first day, states it made her feel better, but that she didn't have anymore.

## 2021-08-09 LAB — SARS-COV-2, NAA 2 DAY TAT

## 2021-08-09 LAB — NOVEL CORONAVIRUS, NAA: SARS-CoV-2, NAA: DETECTED — AB

## 2021-08-14 ENCOUNTER — Institutional Professional Consult (permissible substitution): Payer: Medicaid Other | Admitting: Neurology

## 2021-08-14 ENCOUNTER — Encounter: Payer: Self-pay | Admitting: Neurology

## 2021-08-15 DIAGNOSIS — F411 Generalized anxiety disorder: Secondary | ICD-10-CM | POA: Diagnosis not present

## 2021-08-21 ENCOUNTER — Ambulatory Visit (INDEPENDENT_AMBULATORY_CARE_PROVIDER_SITE_OTHER): Payer: Medicaid Other | Admitting: Nurse Practitioner

## 2021-08-21 ENCOUNTER — Other Ambulatory Visit: Payer: Self-pay

## 2021-08-21 ENCOUNTER — Encounter: Payer: Self-pay | Admitting: Nurse Practitioner

## 2021-08-21 VITALS — BP 132/78 | HR 119 | Temp 98.9°F | Ht 66.0 in | Wt 335.0 lb

## 2021-08-21 DIAGNOSIS — Z8616 Personal history of COVID-19: Secondary | ICD-10-CM

## 2021-08-21 DIAGNOSIS — I10 Essential (primary) hypertension: Secondary | ICD-10-CM

## 2021-08-21 DIAGNOSIS — E1165 Type 2 diabetes mellitus with hyperglycemia: Secondary | ICD-10-CM

## 2021-08-21 DIAGNOSIS — Z23 Encounter for immunization: Secondary | ICD-10-CM | POA: Diagnosis not present

## 2021-08-21 DIAGNOSIS — E782 Mixed hyperlipidemia: Secondary | ICD-10-CM

## 2021-08-21 DIAGNOSIS — Z6841 Body Mass Index (BMI) 40.0 and over, adult: Secondary | ICD-10-CM | POA: Diagnosis not present

## 2021-08-21 DIAGNOSIS — R000 Tachycardia, unspecified: Secondary | ICD-10-CM | POA: Diagnosis not present

## 2021-08-21 DIAGNOSIS — F419 Anxiety disorder, unspecified: Secondary | ICD-10-CM | POA: Diagnosis not present

## 2021-08-21 MED ORDER — BUSPIRONE HCL 10 MG PO TABS: 10.0000 mg | ORAL_TABLET | Freq: Three times a day (TID) | ORAL | 2 refills | Status: DC

## 2021-08-21 MED ORDER — BUSPIRONE HCL 10 MG PO TABS: 10.0000 mg | ORAL_TABLET | Freq: Every day | ORAL | 2 refills | Status: DC

## 2021-08-21 MED ORDER — OZEMPIC (2 MG/DOSE) 8 MG/3ML ~~LOC~~ SOPN: 2.0000 mg | PEN_INJECTOR | SUBCUTANEOUS | 1 refills | Status: DC

## 2021-08-21 NOTE — Patient Instructions (Signed)

## 2021-08-21 NOTE — Progress Notes (Signed)
I,Tianna Badgett,acting as a Education administrator for [employer], FNP.,have documented all relevant documentation on the behalf of Minette Brine, FNP,as directed by  Minette Brine, FNP while in the presence of Minette Brine, [place].  This visit occurred during the SARS-CoV-2 public health emergency.  Safety protocols were in place, including screening questions prior to the visit, additional usage of staff PPE, and extensive cleaning of exam room while observing appropriate contact time as indicated for disinfecting solutions.  Subjective:     Patient ID: [name] , female    DOB: 1980-11-28 , 41 y.o.   MRN: [id number]   Chief Complaint  Patient presents with   Diabetes    HPI  Patient presents today for a dm and htn f/u. She had covid on 08/08/2021 with shortness of breath and treated at ER.  She took prednisone for 5 days. She has recently moved and has been unable to check her blood sugar due to being packed. She reports she is tired due to working long hours (14 hour days).  She has been seeing a therapist with Alf[facility] Sp[facility] - with [place].  Her next appt is February 4th. Phone number is [phone]. She has been dealing with Homelessness over the last year and her anxiety level has been up.   Diabetes She presents for her follow-up diabetic visit. She has type 2 diabetes mellitus. Pertinent negatives for hypoglycemia include no headaches. Pertinent negatives for diabetes include no chest pain. Risk factors for coronary artery disease include obesity, sedentary lifestyle, diabetes mellitus, dyslipidemia and hypertension. Current diabetic treatment includes oral agent (dual therapy). When asked about meal planning, she reported none. She rarely participates in exercise. (She is not checking her blood sugar regularly) She does not see a podiatrist.Eye exam is not current.  Hypertension This is a chronic problem. The current episode started more than 1 year ago. Pertinent negatives include no chest pain,  headaches or palpitations. Risk factors for coronary artery disease include obesity and sedentary lifestyle. There are no compliance problems.  There is no history of chronic renal disease.    Past Medical History:  Diagnosis Date   Abnormal Pap smear    f/u wnl   Asthma    Fibroid    Gestational diabetes    Hypertension    Morbid obesity ([place])    Sleep apnea    Urinary tract infection      Family History  Problem Relation Age of Onset   Diabetes Father    Allergies Mother    Asthma Mother    Hypertension Mother    Diabetes Mother    Diabetes Maternal Grandmother    Heart disease Maternal Grandmother    Anesthesia problems Neg Hx    Hypotension Neg Hx    Malignant hyperthermia Neg Hx    Pseudochol deficiency Neg Hx    Hearing loss Neg Hx      Current Outpatient Medications:    Semaglutide, 2 MG/DOSE, (OZEMPIC, 2 MG/DOSE,) 8 MG/3ML SOPN, Inject 2 mg into the skin once a week., Disp: 9 mL, Rfl: 1   albuterol (PROVENTIL) (5 MG/ML) 0.5% nebulizer solution, Take 0.5 mLs (2.5 mg total) by nebulization every 6 (six) hours as needed for wheezing or shortness of breath., Disp: 20 mL, Rfl: 12   albuterol (VENTOLIN HFA) 108 (90 Base) MCG/ACT inhaler, Inhale 1-2 puffs into the lungs every 6 (six) hours as needed for wheezing or shortness of breath., Disp: 18 g, Rfl: 0   atorvastatin (LIPITOR) 20 MG tablet, TAKE  1 TABLET(20 MG) BY MOUTH DAILY, Disp: 90 tablet, Rfl: 1   benzonatate (TESSALON) 100 MG capsule, Take 1 capsule (100 mg total) by mouth every 8 (eight) hours as needed for cough., Disp: 21 capsule, Rfl: 0   blood glucose meter kit and supplies KIT, Dispense based on patient and insurance preference. Use up to four times daily as directed. (FOR ICD-9 250.00, 250.01)., Disp: 1 each, Rfl: 0   buPROPion (WELLBUTRIN XL) 300 MG 24 hr tablet, TAKE 1 TABLET(300 MG) BY MOUTH DAILY, Disp: 90 tablet, Rfl: 1   busPIRone (BUSPAR) 10 MG tablet, Take 1 tablet (10 mg total) by mouth daily., Disp:  30 tablet, Rfl: 2   cetirizine (ZYRTEC ALLERGY) 10 MG tablet, Take 1 tablet (10 mg total) by mouth daily., Disp: 90 tablet, Rfl: 2   dapagliflozin propanediol (FARXIGA) 10 MG TABS tablet, Take 1 tablet (10 mg total) by mouth daily., Disp: 90 tablet, Rfl: 1   diclofenac (VOLTAREN) 50 MG EC tablet, Take 1 tablet (50 mg total) by mouth 3 (three) times daily., Disp: 45 tablet, Rfl: 3   ferrous sulfate 325 (65 FE) MG EC tablet, Take 1 tablet (325 mg total) by mouth 3 (three) times daily with meals., Disp: 90 tablet, Rfl: 2   fluconazole (DIFLUCAN) 150 MG tablet, Take 1 tablet (150 mg total) by mouth daily., Disp: 2 tablet, Rfl: 1   fluticasone (FLONASE) 50 MCG/ACT nasal spray, Place 1-2 sprays into both nostrils daily., Disp: 16 g, Rfl: 0   glucose blood test strip, USE TO CHECK BLOOD SUGARS UP TO 4 TIMES DAILY  DX CODE:E11.9, Disp: 100 each, Rfl: 12   ibuprofen (ADVIL) 800 MG tablet, Take 800 mg by mouth 3 (three) times daily., Disp: , Rfl:    insulin degludec (TRESIBA FLEXTOUCH) 100 UNIT/ML FlexTouch Pen, Inject 45 Units into the skin daily., Disp: 45 mL, Rfl: 2   Insulin Pen Needle 31G X 8 MM MISC, 1 Device by Does not apply route daily., Disp: 100 each, Rfl: 1   Lancets (ACCU-CHEK MULTICLIX) lancets, USE TO CHECK BLOOD SUGARS UP TO 4 TIMES DAILY  DX CODE:E11.9, Disp: 100 each, Rfl: 12   levonorgestrel (MIRENA) 20 MCG/24HR IUD, 1 each by Intrauterine route once., Disp: , Rfl:    losartan-hydrochlorothiazide (HYZAAR) 50-12.5 MG tablet, Take 1 tablet by mouth daily., Disp: 90 tablet, Rfl: 1   metFORMIN (GLUCOPHAGE-XR) 750 MG 24 hr tablet, TAKE 1 TABLET(750 MG) BY MOUTH IN THE MORNING AND AT BEDTIME, Disp: 180 tablet, Rfl: 1   metroNIDAZOLE (FLAGYL) 500 MG tablet, Take 1 tablet (500 mg total) by mouth 2 (two) times daily., Disp: 14 tablet, Rfl: 0   propranolol (INDERAL) 10 MG tablet, TAKE 1 TABLET(10 MG) BY MOUTH EVERY EVENING, Disp: 90 tablet, Rfl: 1   triamcinolone (KENALOG) 0.025 % ointment, Apply 1  application topically 2 (two) times daily., Disp: 30 g, Rfl: 0   Vitamin D, Ergocalciferol, (DRISDOL) 1.25 MG (50000 UNIT) CAPS capsule, Take 1 capsule by mouth on Tues and on Fridays., Disp: 24 capsule, Rfl: 0   Allergies  Allergen Reactions   Other Anaphylaxis and Other (See Comments)    Pt has fish allergy Pt has allergy to butter cookies   Penicillins Rash and Other (See Comments)    Told MD that PCN reaction is rash and that she has taken amoxicillin before   Bactrim [Sulfamethoxazole-Trimethoprim]     Diarrhea, abdominal cramps and rash   Fish Allergy     Other reaction(s): Unknown  Review of Systems  Constitutional: Negative.   Respiratory: Negative.    Cardiovascular: Negative.  Negative for chest pain and palpitations.  Gastrointestinal: Negative.   Neurological: Negative.  Negative for headaches.    Today's Vitals   08/21/21 1135  BP: 132/78  Pulse: (!) 119  Temp: 98.9 F (37.2 C)  TempSrc: Oral  Weight: (!) 335 lb (152 kg)  Height: _0  (1.676 m)   Body mass index is 54.07 kg/m.  Wt Readings from Last 3 Encounters:  08/21/21 (!) 335 lb (152 kg)  06/04/21 (!) 342 lb 9.6 oz (155.4 kg)  05/21/21 (!) 340 lb 3.2 oz (154.3 kg)    Objective:  Physical Exam Vitals reviewed.  Constitutional:      General: She is not in acute distress.    Appearance: Normal appearance. She is obese.  Eyes:     Pupils: Pupils are equal, round, and reactive to light.  Cardiovascular:     Rate and Rhythm: Normal rate and regular rhythm.     Pulses: Normal pulses.     Heart sounds: Normal heart sounds. No murmur heard. Pulmonary:     Effort: Pulmonary effort is normal. No respiratory distress.     Breath sounds: Normal breath sounds. No wheezing.  Musculoskeletal:     Right lower leg: No edema.     Left lower leg: No edema.  Skin:    General: Skin is warm and dry.     Capillary Refill: Capillary refill takes less than 2 seconds.  Neurological:     General: No focal  deficit present.     Mental Status: She is alert and oriented to person, place, and time.     Cranial Nerves: No cranial nerve deficit.     Motor: No weakness.  Psychiatric:        Mood and Affect: Mood normal.        Behavior: Behavior normal.        Thought Content: Thought content normal.        Judgment: Judgment normal.        Assessment And Plan:     1. Uncontrolled type 2 diabetes mellitus with hyperglycemia (HCC) Comments: She is to f/u with Dr. Charlett Lango, I will increase her Ozempic to 2 mg weekly.   She is advised to check her blood sugars regularly.  I have also discussed with her the importance to keep her appts with myself and her specialist.  - Hemoglobin A1c - Semaglutide, 2 MG/DOSE, (OZEMPIC, 2 MG/DOSE,) 8 MG/3ML SOPN; Inject 2 mg into the skin once a week.  Dispense: 9 mL; Refill: 1  2. Essential hypertension Comments: Blood pressure is normal. Continue current medications - CMP14+EGFR  3. Mixed hyperlipidemia Comments: Stable, continue statin. Tolerating well.  - Lipid panel  4. Tachycardia Comments: Her heart rate in increased today, I have advised her she needs to be seen by Cardiology again. I did make another referral to Cardiology. Avoid caffeine - Ambulatory referral to Cardiology  5. Anxiety Comments: Worsened since her last visit, will start her on buspirone. Continue f/u with therapist.  - busPIRone (BUSPAR) 10 MG tablet; Take 1 tablet (10 mg total) by mouth daily.  Dispense: 30 tablet; Refill: 2  6. Encounter for immunization - Pneumococcal conjugate vaccine 20-valent (Prevnar 20)  7. Class 3 severe obesity due to excess calories with serious comorbidity and body mass index (BMI) of 50.0 to 59.9 in adult [facility]) Stressed the importance of keeping her stress level down to help  with weight loss.  Also encouraged healthy diet.   8. History of COVID-19 Comments: Had 08/08/2021, treated with azithromycin, prednisone and tessalon perles. She is feeling  much better, did not lose taste or smell     Patient was given opportunity to ask questions. Patient verbalized understanding of the plan and was able to repeat key elements of the plan. All questions were answered to their satisfaction.  Minette Brine, FNP   I, Minette Brine, FNP, have reviewed all documentation for this visit. The documentation on 08/21/21 for the exam, diagnosis, procedures, and orders are all accurate and complete.   IF YOU HAVE BEEN REFERRED TO A SPECIALIST, IT MAY TAKE 1-2 WEEKS TO SCHEDULE/PROCESS THE REFERRAL. IF YOU HAVE NOT HEARD FROM US/SPECIALIST IN TWO WEEKS, PLEASE GIVE [place] A CALL AT [phone].   THE PATIENT IS ENCOURAGED TO PRACTICE SOCIAL DISTANCING DUE TO THE COVID-19 PANDEMIC.

## 2021-08-22 LAB — CMP14+EGFR
ALT: 14 IU/L (ref 0–32)
AST: 12 IU/L (ref 0–40)
Albumin/Globulin Ratio: 1.6 (ref 1.2–2.2)
Albumin: 4.3 g/dL (ref 3.8–4.8)
Alkaline Phosphatase: 73 IU/L (ref 44–121)
BUN/Creatinine Ratio: 10 (ref 9–23)
BUN: 8 mg/dL (ref 6–20)
Bilirubin Total: 0.4 mg/dL (ref 0.0–1.2)
CO2: 22 mmol/L (ref 20–29)
Calcium: 9.8 mg/dL (ref 8.7–10.2)
Chloride: 101 mmol/L (ref 96–106)
Creatinine, Ser: 0.77 mg/dL (ref 0.57–1.00)
Globulin, Total: 2.7 g/dL (ref 1.5–4.5)
Glucose: 168 mg/dL — ABNORMAL HIGH (ref 70–99)
Potassium: 4.3 mmol/L (ref 3.5–5.2)
Sodium: 140 mmol/L (ref 134–144)
Total Protein: 7 g/dL (ref 6.0–8.5)
eGFR: 101 mL/min/{1.73_m2} (ref >59)

## 2021-08-22 LAB — LIPID PANEL
Chol/HDL Ratio: 4.9 ratio — ABNORMAL HIGH (ref 0.0–4.4)
Cholesterol, Total: 172 mg/dL (ref 100–199)
HDL: 35 mg/dL — ABNORMAL LOW (ref >39)
LDL Chol Calc (NIH): 106 mg/dL — ABNORMAL HIGH (ref 0–99)
Triglycerides: 176 mg/dL — ABNORMAL HIGH (ref 0–149)
VLDL Cholesterol Cal: 31 mg/dL (ref 5–40)

## 2021-08-22 LAB — HEMOGLOBIN A1C
Est. average glucose Bld gHb Est-mCnc: 189 mg/dL
Hgb A1c MFr Bld: 8.2 % — ABNORMAL HIGH (ref 4.8–5.6)

## 2021-08-23 ENCOUNTER — Ambulatory Visit: Payer: Self-pay

## 2021-08-23 ENCOUNTER — Encounter: Payer: Self-pay | Admitting: Nurse Practitioner

## 2021-08-23 ENCOUNTER — Telehealth: Payer: Medicaid Other

## 2021-08-23 ENCOUNTER — Other Ambulatory Visit: Payer: Self-pay

## 2021-08-23 DIAGNOSIS — R000 Tachycardia, unspecified: Secondary | ICD-10-CM

## 2021-08-23 DIAGNOSIS — E1165 Type 2 diabetes mellitus with hyperglycemia: Secondary | ICD-10-CM

## 2021-08-23 DIAGNOSIS — N760 Acute vaginitis: Secondary | ICD-10-CM

## 2021-08-23 DIAGNOSIS — E782 Mixed hyperlipidemia: Secondary | ICD-10-CM

## 2021-08-23 DIAGNOSIS — I10 Essential (primary) hypertension: Secondary | ICD-10-CM

## 2021-08-23 DIAGNOSIS — F419 Anxiety disorder, unspecified: Secondary | ICD-10-CM

## 2021-08-23 DIAGNOSIS — E559 Vitamin D deficiency, unspecified: Secondary | ICD-10-CM

## 2021-08-23 MED ORDER — FLUCONAZOLE 150 MG PO TABS: 150.0000 mg | ORAL_TABLET | Freq: Every day | ORAL | 1 refills | Status: DC

## 2021-08-23 NOTE — Patient Instructions (Signed)
Visit Information  Thank you for taking time to visit with me today. Please don't hesitate to contact me if I can be of assistance to you before our next scheduled telephone appointment.  Following are the goals we discussed today:  (Copy and paste patient goals from clinical care plan here)  Our next appointment is by telephone on 10/21/21 at 11:05 AM  Please call the care guide team at [phone] if you need to cancel or reschedule your appointment.   If you are experiencing a Mental Health or [place] or need someone to talk to, please call 1-800-273-TALK (toll free, 24 hour hotline)   Patient verbalizes understanding of instructions and care plan provided today and agrees to view in [place]. Active MyChart status confirmed with patient.    Barb Merino, RN, BSN, CCM Care Management Coordinator [facility]  Direct Phone: [phone]

## 2021-08-23 NOTE — Chronic Care Management (AMB) (Signed)
Care Management    RN Visit Note  08/23/2021 Name: [name] MRN: [id number] DOB: 1980-09-20  Subjective: [name] is a 41 y.o. year old female who is a primary care patient of [name], [place]. The care management team was consulted for assistance with disease management and care coordination needs.    Engaged with patient by telephone for follow up visit in response to provider referral for case management and/or care coordination services.   Consent to Services:   [name] was given information about Care Management services today including:  Care Management services includes personalized support from designated clinical staff supervised by her physician, including individualized plan of care and coordination with other care providers 24/7 contact phone numbers for assistance for urgent and routine care needs. The patient may stop case management services at any time by phone call to the office staff.  Patient agreed to services and consent obtained.   Assessment: Review of patient past medical history, allergies, medications, health status, including review of consultants reports, laboratory and other test data, was performed as part of comprehensive evaluation and provision of chronic care management services.   SDOH (Social Determinants of Health) assessments and interventions performed: Yes, no acute needs   Care Plan  Allergies  Allergen Reactions   Other Anaphylaxis and Other (See Comments)    Pt has fish allergy Pt has allergy to butter cookies   Penicillins Rash and Other (See Comments)    Told MD that PCN reaction is rash and that she has taken amoxicillin before   Bactrim [Sulfamethoxazole-Trimethoprim]     Diarrhea, abdominal cramps and rash   Fish Allergy     Other reaction(s): Unknown    Outpatient Encounter Medications as of 08/23/2021  Medication Sig Note   albuterol (PROVENTIL) (5 MG/ML) 0.5% nebulizer solution Take 0.5 mLs (2.5 mg total) by  nebulization every 6 (six) hours as needed for wheezing or shortness of breath.    albuterol (VENTOLIN HFA) 108 (90 Base) MCG/ACT inhaler Inhale 1-2 puffs into the lungs every 6 (six) hours as needed for wheezing or shortness of breath.    atorvastatin (LIPITOR) 20 MG tablet TAKE 1 TABLET(20 MG) BY MOUTH DAILY    benzonatate (TESSALON) 100 MG capsule Take 1 capsule (100 mg total) by mouth every 8 (eight) hours as needed for cough.    blood glucose meter kit and supplies KIT Dispense based on patient and insurance preference. Use up to four times daily as directed. (FOR ICD-9 250.00, 250.01).    buPROPion (WELLBUTRIN XL) 300 MG 24 hr tablet TAKE 1 TABLET(300 MG) BY MOUTH DAILY    busPIRone (BUSPAR) 10 MG tablet Take 1 tablet (10 mg total) by mouth daily.    cetirizine (ZYRTEC ALLERGY) 10 MG tablet Take 1 tablet (10 mg total) by mouth daily.    dapagliflozin propanediol (FARXIGA) 10 MG TABS tablet Take 1 tablet (10 mg total) by mouth daily.    diclofenac (VOLTAREN) 50 MG EC tablet Take 1 tablet (50 mg total) by mouth 3 (three) times daily.    ferrous sulfate 325 (65 FE) MG EC tablet Take 1 tablet (325 mg total) by mouth 3 (three) times daily with meals.    fluconazole (DIFLUCAN) 150 MG tablet Take 1 tablet (150 mg total) by mouth daily.    fluticasone (FLONASE) 50 MCG/ACT nasal spray Place 1-2 sprays into both nostrils daily.    glucose blood test strip USE TO CHECK BLOOD SUGARS UP TO 4 TIMES DAILY  DX CODE:E11.9    ibuprofen (ADVIL) 800 MG tablet Take 800 mg by mouth 3 (three) times daily. 08/30/2020: Was taking this medicine because she needed her tooth pulled    insulin degludec (TRESIBA FLEXTOUCH) 100 UNIT/ML FlexTouch Pen Inject 45 Units into the skin daily.    Insulin Pen Needle 31G X 8 MM MISC 1 Device by Does not apply route daily.    Lancets (ACCU-CHEK MULTICLIX) lancets USE TO CHECK BLOOD SUGARS UP TO 4 TIMES DAILY  DX CODE:E11.9    levonorgestrel (MIRENA) 20 MCG/24HR IUD 1 each by  Intrauterine route once.    losartan-hydrochlorothiazide (HYZAAR) 50-12.5 MG tablet Take 1 tablet by mouth daily.    metFORMIN (GLUCOPHAGE-XR) 750 MG 24 hr tablet TAKE 1 TABLET(750 MG) BY MOUTH IN THE MORNING AND AT BEDTIME    metroNIDAZOLE (FLAGYL) 500 MG tablet Take 1 tablet (500 mg total) by mouth 2 (two) times daily.    propranolol (INDERAL) 10 MG tablet TAKE 1 TABLET(10 MG) BY MOUTH EVERY EVENING    Semaglutide, 2 MG/DOSE, (OZEMPIC, 2 MG/DOSE,) 8 MG/3ML SOPN Inject 2 mg into the skin once a week.    triamcinolone (KENALOG) 0.025 % ointment Apply 1 application topically 2 (two) times daily.    Vitamin D, Ergocalciferol, (DRISDOL) 1.25 MG (50000 UNIT) CAPS capsule Take 1 capsule by mouth on Tues and on Fridays.    No facility-administered encounter medications on file as of 08/23/2021.    Patient Active Problem List   Diagnosis Date Noted   Dyslipidemia 11/30/2020   Weight gain 11/30/2020   Class 3 severe obesity due to excess calories with serious comorbidity and body mass index (BMI) of 50.0 to 59.9 in adult ([place]) 05/19/2019   Anemia 12/21/2018   Uncontrolled type 2 diabetes mellitus with hyperglycemia ([place]) 12/21/2018   Anxiety 07/19/2018   Depression 07/19/2018   Essential hypertension 07/19/2018   Fibroid uterus 09/17/2015   Menorrhagia 08/27/2015   Type 2 diabetes mellitus without complication, with long-term current use of insulin ([place]) 09/13/2012   BMI 60.0-69.9, adult ([place]) 09/13/2012   Asthma 09/13/2012   History of high blood pressure 09/13/2012   OSA (obstructive sleep apnea) 12/10/2010    Conditions to be addressed/monitored:  DMII, HTN, Class 3 Obesity, Vitamin D deficiency  Care Plan : RN Care Manager Plan of Care  Updates made by Stephanie Logan, RN since 08/23/2021 12:00 AM     Problem: Chronic disease eduction and Care Coordination needs for DMII, HTN, Class 3 Obesity, Vitamin D deficiency   Priority: High     Long-Range Goal: Assist with Chronic disease  education and Care Coordination needs for DMII, HTN, Class 3 Obesity, Vitamin D deficiency   Start Date: 05/09/2021  Expected End Date: 05/09/2022  Recent Progress: On track  Priority: High  Note:   Current Barriers:  Knowledge Deficits related to plan of care for management of HTN, DMII, and Class 3 Obesity, Vitamin D deficiency Chronic Disease Management support and education needs related to HTN, DMII, and Class 3 Obesity  RNCM Clinical Goal(s):  Patient will demonstrate Ongoing health management independence   continue to work with RN Care Manager to address care management and care coordination needs related to  HTN, DMII, and Class III Obesity, Vitamin D deficiency  through collaboration with RN Care manager, provider, and care team.   Interventions: 1:1 collaboration with primary care provider regarding development and update of comprehensive plan of care as evidenced by provider attestation and co-signature Inter-disciplinary care team collaboration (  see longitudinal plan of care) Evaluation of current treatment plan related to  self management and patient's adherence to plan as established by provider  Weight Loss Interventions: (Status: Goal on track. Yes) Evaluation of current treatment plan related to Obesity, self-management and patient's adherence to plan as established by provider Review of patient status, including review of consultant's reports, relevant laboratory and other test results, and medications completed Reviewed medications with patient and discussed importance of medication adherence, reviewed increase in Semaglutide to 2 mg weekly; Educated on potential SE with initial increase  Discussed plans with patient for ongoing care management follow up and provided patient with direct contact information for care management team Body mass index is 54.07 kg/m.     Wt Readings from Last 3 Encounters:  08/21/21 (!) 335 lb (152 kg)  06/04/21 (!) 342 lb 9.6 oz (155.4 kg)   05/21/21 (!) 340 lb 3.2 oz (154.3 kg)   Hypertension Interventions:  (Status:  Goal on track:  Yes.) Long Term Goal Last practice recorded BP readings:  BP Readings from Last 3 Encounters:  08/21/21 132/78  08/08/21 (!) 134/92  06/04/21 138/85  Most recent eGFR/CrCl:  Lab Results  Component Value Date   EGFR 101 08/21/2021    No components found for: CRCL  Evaluation of current treatment plan related to hypertension self management and patient's adherence to plan as established by provider Counseled on the importance of exercise goals with target of 150 minutes per week Advised patient, providing education and rationale, to monitor blood pressure daily and record, calling PCP for findings outside established parameters Screening for signs and symptoms of depression related to chronic disease state  Review of patient status, including review of consultant's reports, relevant laboratory and other test results, and medications completed. Reviewed medications with patient and discussed importance of medication adherence Discussed plans with patient for ongoing care management follow up and provided patient with direct contact information for care management team   06/07/21 completed successful outbound call with patient  Discussed patient completed a follow up with Cardiology for evaluation of abnormal EKG Review of patient status, including review of consultant's reports, relevant laboratory and other test results, and medications completed. Assessed for knowledge deficits related to recommendations for Cardiac follow up; determined patient will follow up with Cardiology in 6 months  Educated patient on cardiac signs/symptoms and when to call the doctor if symptoms occur  Discussed plans with patient for ongoing care management follow up and provided patient with direct contact information for care management team  Vitamin D deficiency Interventions:  (Status: Condition stable.  Not  addressed this visit.) Vit D, 25-Hydroxy 30.0 - 100.0 ng/mL 20.0 Low    Evaluation of current treatment plan related to  Vitamin D deficiency , self-management and patient's adherence to plan as established by provider Discussed plans with patient for ongoing care management follow up and provided patient with direct contact information for care management team Provided education to patient re: dietary recommendations, getting 15 minutes of natural sunlight daily when possible ; Reviewed medications with patient and discussed dosage, frequency and indication for use of Vitamin D supplementation; Discussed plans with patient for ongoing care management follow up and provided patient with direct contact information for care management team; Assessed social determinant of health barriers;   Diabetes Interventions:  (Status:  Goal on track:  Yes.) Long Term Goal Assessed patient's understanding of A1c goal: <6.5% Provided education to patient about basic DM disease process Reviewed medications with patient and  discussed importance of medication adherence Advised patient, providing education and rationale, to check cbg daily before meals and at bedtime and record, calling PCP and or RN CM for findings outside established parameters Review of patient status, including review of consultants reports, relevant laboratory and other test results, and medications completed Reviewed and discussed need for Endocrinology follow up, discussed patient will call Dr. Kelton Pillar at earliest convenience to schedule her follow up appt Lab Results  Component Value Date   HGBA1C 8.2 (H) 08/21/2021     Hyperlipidemia Interventions:  (Status:  Goal on track:  Yes.) Long Term Goal Evaluation of current treatment plan related to Mixed hyperlipidemia, self-management and patient's adherence to plan as established by provider Medication review performed; medication list updated in electronic medical record.  Provider  established cholesterol goals reviewed Counseled on importance of regular laboratory monitoring as prescribed Provided HLD educational materials Reviewed role and benefits of statin for ASCVD risk reduction Reviewed importance of limiting foods high in cholesterol Reviewed exercise goals and target of 150 minutes per week  Reviewed and discussed PCP recommendation for Cardiology follow up for tachycardia Encouraged patient to contact Cardiology today to schedule a follow up visit for evaluation of Hyperlipidemia and tachycardia Reviewed and discussed CDC recommendations regarding when to receive a COVID booster vaccine following recent COVID infection  Printed educational materials to be mailed to patient related to Managing Cholesterol Discussed plans with patient for ongoing care management follow up and provided patient with direct contact information for care management team  Patient Goals/Self-Care Activities: Take all medications as prescribed Attend all scheduled provider appointments Call pharmacy for medication refills 3-7 days in advance of running out of medications Perform IADL's (shopping, preparing meals, housekeeping, managing finances) independently Call provider office for new concerns or questions  drink 6 to 8 glasses of water each day manage portion size take blood pressure log to all doctor appointments call doctor for signs and symptoms of high blood pressure keep all doctor appointments take medications for blood pressure exactly as prescribed report new symptoms to your doctor take all medications exactly as prescribed call doctor when you experience any new symptoms go to all doctor appointments as scheduled adhere to prescribed diet: low Sodium  develop an exercise routine  Follow Up Plan:  Telephone follow up appointment with care management team member scheduled for:  10/21/21       Plan: Telephone follow up appointment with care management team member  scheduled for:  10/21/21  Barb Merino, RN, BSN, CCM Care Management Coordinator [place] [facility]  Direct Phone: [phone]

## 2021-08-25 ENCOUNTER — Ambulatory Visit (INDEPENDENT_AMBULATORY_CARE_PROVIDER_SITE_OTHER): Payer: Medicaid Other

## 2021-08-25 ENCOUNTER — Other Ambulatory Visit: Payer: Self-pay

## 2021-08-25 ENCOUNTER — Encounter: Payer: Self-pay | Admitting: Emergency Medicine

## 2021-08-25 ENCOUNTER — Ambulatory Visit
Admission: EM | Admit: 2021-08-25 | Discharge: 2021-08-25 | Disposition: A | Discharge status: Home / Self Care | Payer: Medicaid Other | Attending: Physician Assistant | Admitting: Physician Assistant

## 2021-08-25 DIAGNOSIS — J019 Acute sinusitis, unspecified: Secondary | ICD-10-CM | POA: Diagnosis not present

## 2021-08-25 DIAGNOSIS — U071 COVID-19: Secondary | ICD-10-CM

## 2021-08-25 DIAGNOSIS — J209 Acute bronchitis, unspecified: Secondary | ICD-10-CM | POA: Diagnosis not present

## 2021-08-25 MED ORDER — FLUCONAZOLE 150 MG PO TABS: 150.0000 mg | ORAL_TABLET | Freq: Once | ORAL | 0 refills | Status: AC

## 2021-08-25 MED ORDER — DOXYCYCLINE HYCLATE 100 MG PO CAPS: 100.0000 mg | ORAL_CAPSULE | Freq: Two times a day (BID) | ORAL | 0 refills | Status: DC

## 2021-08-25 MED ORDER — PREDNISONE 20 MG PO TABS: 40.0000 mg | ORAL_TABLET | Freq: Every day | ORAL | 0 refills | Status: AC

## 2021-08-25 NOTE — ED Triage Notes (Signed)
Pt sts had covid earlier this month; pt sts still having nasal congestion and productive cough; pt sts fatigue

## 2021-08-25 NOTE — ED Provider Notes (Signed)
[facility]    CSN: [id number] Arrival date & time: 08/25/21  1137      History   Chief Complaint Chief Complaint  Patient presents with   Nasal Congestion   Cough    HPI [name] is a 41 y.o. female.   Patient here today for evaluation of nasal congestion and sinus pressure, ear pain, cough and fatigue that have lingered since being diagnosed with Covid about 17 days ago. She reports that she was prescribed zithromycin for suspected ear infection at that time but states that this did not seem helpful.   The history is provided by the patient.  Cough Associated symptoms: ear pain and sore throat   Associated symptoms: no chills, no eye discharge, no fever and no shortness of breath    Past Medical History:  Diagnosis Date   Abnormal Pap smear    f/u wnl   Asthma    Fibroid    Gestational diabetes    Hypertension    Morbid obesity ([place])    Sleep apnea    Urinary tract infection     Patient Active Problem List   Diagnosis Date Noted   Dyslipidemia 11/30/2020   Weight gain 11/30/2020   Class 3 severe obesity due to excess calories with serious comorbidity and body mass index (BMI) of 50.0 to 59.9 in adult ([place]) 05/19/2019   Anemia 12/21/2018   Uncontrolled type 2 diabetes mellitus with hyperglycemia ([place]) 12/21/2018   Anxiety 07/19/2018   Depression 07/19/2018   Essential hypertension 07/19/2018   Fibroid uterus 09/17/2015   Menorrhagia 08/27/2015   Type 2 diabetes mellitus without complication, with long-term current use of insulin ([place]) 09/13/2012   BMI 60.0-69.9, adult ([place]) 09/13/2012   Asthma 09/13/2012   History of high blood pressure 09/13/2012   OSA (obstructive sleep apnea) 12/10/2010    Past Surgical History:  Procedure Laterality Date   CESAREAN SECTION     DILATION AND CURETTAGE OF UTERUS     TUBAL LIGATION      OB History     Gravida  4   Para  2   Term  1   Preterm  1   AB  2   Living  3      SAB  2    IAB  0   Ectopic  0   Multiple  1   Live Births  3            Home Medications    Prior to Admission medications   Medication Sig Start Date End Date Taking? Authorizing Provider  doxycycline (VIBRAMYCIN) 100 MG capsule Take 1 capsule (100 mg total) by mouth 2 (two) times daily. 08/25/21  Yes Francene Finders, [name]  fluconazole (DIFLUCAN) 150 MG tablet Take 1 tablet (150 mg total) by mouth once for 1 dose. 08/25/21 08/25/21 Yes Francene Finders, [name]  predniSONE (DELTASONE) 20 MG tablet Take 2 tablets (40 mg total) by mouth daily with breakfast for 5 days. 08/25/21 08/30/21 Yes Francene Finders, [name]  albuterol (PROVENTIL) (5 MG/ML) 0.5% nebulizer solution Take 0.5 mLs (2.5 mg total) by nebulization every 6 (six) hours as needed for wheezing or shortness of breath. 04/15/19   Zigmund Gottron, NP  albuterol (VENTOLIN HFA) 108 (90 Base) MCG/ACT inhaler Inhale 1-2 puffs into the lungs every 6 (six) hours as needed for wheezing or shortness of breath. 07/23/20   Wieters, Hallie C, [name]  atorvastatin (LIPITOR) 20 MG tablet TAKE 1 TABLET(20 MG)  BY MOUTH DAILY 04/26/21   Minette Brine, FNP  benzonatate (TESSALON) 100 MG capsule Take 1 capsule (100 mg total) by mouth every 8 (eight) hours as needed for cough. 08/08/21   Teodora Medici, FNP  blood glucose meter kit and supplies KIT Dispense based on patient and insurance preference. Use up to four times daily as directed. (FOR ICD-9 250.00, 250.01). 07/23/20   Wieters, Hallie C, [name]  buPROPion (WELLBUTRIN XL) 300 MG 24 hr tablet TAKE 1 TABLET(300 MG) BY MOUTH DAILY 04/18/21   Ghumman, Ramandeep, NP  busPIRone (BUSPAR) 10 MG tablet Take 1 tablet (10 mg total) by mouth daily. 08/21/21   Minette Brine, FNP  cetirizine (ZYRTEC ALLERGY) 10 MG tablet Take 1 tablet (10 mg total) by mouth daily. 04/26/21   Bary Castilla, NP  dapagliflozin propanediol (FARXIGA) 10 MG TABS tablet Take 1 tablet (10 mg total) by mouth daily. 11/30/20   Shamleffer, Melanie Crazier,  MD  diclofenac (VOLTAREN) 50 MG EC tablet Take 1 tablet (50 mg total) by mouth 3 (three) times daily. 07/11/19   Woodroe Mode, MD  ferrous sulfate 325 (65 FE) MG EC tablet Take 1 tablet (325 mg total) by mouth 3 (three) times daily with meals. 10/31/19 06/04/21  Rodriguez-Southworth, Sunday Spillers, [name]  fluticasone (FLONASE) 50 MCG/ACT nasal spray Place 1-2 sprays into both nostrils daily. 04/18/21   Ghumman, Ramandeep, NP  glucose blood test strip USE TO CHECK BLOOD SUGARS UP TO 4 TIMES DAILY  DX CODE:E11.9 05/05/19   Rodriguez-Southworth, Sunday Spillers, [name]  ibuprofen (ADVIL) 800 MG tablet Take 800 mg by mouth 3 (three) times daily. 07/11/20   [provider]  insulin degludec (TRESIBA FLEXTOUCH) 100 UNIT/ML FlexTouch Pen Inject 45 Units into the skin daily. 04/18/21   Ghumman, Ramandeep, NP  Insulin Pen Needle 31G X 8 MM MISC 1 Device by Does not apply route daily. 11/30/20   Shamleffer, Melanie Crazier, MD  Lancets (ACCU-CHEK MULTICLIX) lancets USE TO CHECK BLOOD SUGARS UP TO 4 TIMES DAILY  DX CODE:E11.9 05/05/19   Rodriguez-Southworth, Sunday Spillers, [name]  levonorgestrel (MIRENA) 20 MCG/24HR IUD 1 each by Intrauterine route once.    [provider]  losartan-hydrochlorothiazide (HYZAAR) 50-12.5 MG tablet Take 1 tablet by mouth daily. 06/05/21 09/03/21  Minette Brine, FNP  metFORMIN (GLUCOPHAGE-XR) 750 MG 24 hr tablet TAKE 1 TABLET(750 MG) BY MOUTH IN THE MORNING AND AT BEDTIME 06/04/20   Minette Brine, FNP  metroNIDAZOLE (FLAGYL) 500 MG tablet Take 1 tablet (500 mg total) by mouth 2 (two) times daily. Patient not taking: Reported on 08/25/2021 05/15/21   Tedd Sias, PA  propranolol (INDERAL) 10 MG tablet TAKE 1 TABLET(10 MG) BY MOUTH EVERY EVENING 01/22/21   Minette Brine, FNP  Semaglutide, 2 MG/DOSE, (OZEMPIC, 2 MG/DOSE,) 8 MG/3ML SOPN Inject 2 mg into the skin once a week. 08/21/21   Minette Brine, FNP  triamcinolone (KENALOG) 0.025 % ointment Apply 1 application topically 2 (two) times daily.  04/18/21   Bary Castilla, NP  Vitamin D, Ergocalciferol, (DRISDOL) 1.25 MG (50000 UNIT) CAPS capsule Take 1 capsule by mouth on Tues and on Fridays. 04/23/21   Bary Castilla, NP    Family History Family History  Problem Relation Age of Onset   Diabetes Father    Allergies Mother    Asthma Mother    Hypertension Mother    Diabetes Mother    Diabetes Maternal Grandmother    Heart disease Maternal Grandmother    Anesthesia problems Neg Hx    Hypotension Neg  Hx    Malignant hyperthermia Neg Hx    Pseudochol deficiency Neg Hx    Hearing loss Neg Hx     Social History Social History   Tobacco Use   Smoking status: Former    Types: Cigarettes    Quit date: 08/08/2019    Years since quitting: 2.0   Smokeless tobacco: Never  Vaping Use   Vaping Use: Never used  Substance Use Topics   Alcohol use: No    Comment: occasional   Drug use: No     Allergies   Other, Penicillins, Bactrim [sulfamethoxazole-trimethoprim], and Fish allergy   Review of Systems Review of Systems  Constitutional:  Negative for chills and fever.  HENT:  Positive for congestion, ear pain, sinus pressure and sore throat.   Eyes:  Negative for discharge and redness.  Respiratory:  Positive for cough. Negative for shortness of breath.     Physical Exam Triage Vital Signs ED Triage Vitals  Enc Vitals Group     BP 08/25/21 1154 (!) 149/100     Pulse Rate 08/25/21 1154 (!) 101     Resp 08/25/21 1154 18     Temp 08/25/21 1154 98.6 F (37 C)     Temp Source 08/25/21 1154 Oral     SpO2 08/25/21 1154 95 %     Weight --      Height --      Head Circumference --      Peak Flow --      Pain Score 08/25/21 1155 3     Pain Loc --      Pain Edu? --      Excl. in [place]? --    No data found.  Updated Vital Signs BP (!) 149/100 (BP Location: Right Arm)    Pulse (!) 101    Temp 98.6 F (37 C) (Oral)    Resp 18    SpO2 95%      Physical Exam Vitals and nursing note reviewed.  Constitutional:       General: She is not in acute distress.    Appearance: Normal appearance. She is not ill-appearing.  HENT:     Head: Normocephalic and atraumatic.     Ears:     Comments: Bilateral TM erythematous    Nose: Congestion present.     Mouth/Throat:     Mouth: Mucous membranes are moist.     Pharynx: No oropharyngeal exudate or posterior oropharyngeal erythema.  Eyes:     Conjunctiva/sclera: Conjunctivae normal.  Cardiovascular:     Rate and Rhythm: Normal rate and regular rhythm.     Heart sounds: Normal heart sounds. No murmur heard. Pulmonary:     Effort: Pulmonary effort is normal. No respiratory distress.     Breath sounds: Normal breath sounds. No wheezing, rhonchi or rales.  Skin:    General: Skin is warm and dry.  Neurological:     Mental Status: She is alert.  Psychiatric:        Mood and Affect: Mood normal.        Thought Content: Thought content normal.     UC Treatments / Results  Labs (all labs ordered are listed, but only abnormal results are displayed) Labs Reviewed - No data to display  EKG   Radiology DG Chest 2 View  Result Date: 08/25/2021 CLINICAL DATA:  COVID positive EXAM: CHEST - 2 VIEW COMPARISON:  Chest x-ray 08/25/2016 FINDINGS: Heart size and mediastinal contours are within normal limits. No suspicious  pulmonary opacities identified. No pleural effusion or pneumothorax visualized. No acute osseous abnormality appreciated. IMPRESSION: No acute intrathoracic process identified. Electronically Signed   By: Ofilia Neas M.D.   On: 08/25/2021 13:08    Procedures Procedures (including critical care time)  Medications Ordered in UC Medications - No data to display  Initial Impression / Assessment and Plan / UC Course  I have reviewed the triage vital signs and the nursing notes.  Pertinent labs & imaging results that were available during my care of the patient were reviewed by me and considered in my medical decision making (see chart for  details).    Prednisone and doxycycline prescribed to cover bronchitis and sinusitis. Diflucan prescribed at patient request to cover possible impending yeast infection. Encouraged follow up with any further concerns.   Final Clinical Impressions(s) / UC Diagnoses   Final diagnoses:  Acute bronchitis, unspecified organism  Acute sinusitis, recurrence not specified, unspecified location   Discharge Instructions   None    ED Prescriptions     Medication Sig Dispense Auth. Provider   predniSONE (DELTASONE) 20 MG tablet Take 2 tablets (40 mg total) by mouth daily with breakfast for 5 days. 10 tablet Ewell Poe F, [name]   doxycycline (VIBRAMYCIN) 100 MG capsule Take 1 capsule (100 mg total) by mouth 2 (two) times daily. 20 capsule Ewell Poe F, [name]   fluconazole (DIFLUCAN) 150 MG tablet Take 1 tablet (150 mg total) by mouth once for 1 dose. 1 tablet Francene Finders, [name]      PDMP not reviewed this encounter.   Francene Finders, [name] 08/25/21 1325

## 2021-08-27 ENCOUNTER — Ambulatory Visit: Payer: Medicaid Other

## 2021-08-28 ENCOUNTER — Ambulatory Visit (INDEPENDENT_AMBULATORY_CARE_PROVIDER_SITE_OTHER): Payer: Medicaid Other | Admitting: Podiatry

## 2021-08-28 DIAGNOSIS — Z91199 Patient's noncompliance with other medical treatment and regimen due to unspecified reason: Secondary | ICD-10-CM

## 2021-08-28 NOTE — Progress Notes (Signed)
No show

## 2021-09-06 ENCOUNTER — Encounter: Payer: Self-pay | Admitting: Nurse Practitioner

## 2021-09-06 ENCOUNTER — Other Ambulatory Visit: Payer: Self-pay

## 2021-09-06 ENCOUNTER — Other Ambulatory Visit: Payer: Self-pay | Admitting: Nurse Practitioner

## 2021-09-06 ENCOUNTER — Ambulatory Visit: Payer: Medicaid Other | Admitting: Internal Medicine

## 2021-09-06 DIAGNOSIS — Z111 Encounter for screening for respiratory tuberculosis: Secondary | ICD-10-CM

## 2021-09-09 ENCOUNTER — Other Ambulatory Visit: Payer: Self-pay

## 2021-09-09 ENCOUNTER — Other Ambulatory Visit: Payer: Medicaid Other

## 2021-09-09 DIAGNOSIS — Z111 Encounter for screening for respiratory tuberculosis: Secondary | ICD-10-CM

## 2021-09-11 ENCOUNTER — Other Ambulatory Visit: Payer: Self-pay

## 2021-09-11 DIAGNOSIS — E1165 Type 2 diabetes mellitus with hyperglycemia: Secondary | ICD-10-CM

## 2021-09-11 DIAGNOSIS — F419 Anxiety disorder, unspecified: Secondary | ICD-10-CM

## 2021-09-11 DIAGNOSIS — I10 Essential (primary) hypertension: Secondary | ICD-10-CM

## 2021-09-11 DIAGNOSIS — R519 Headache, unspecified: Secondary | ICD-10-CM

## 2021-09-11 MED ORDER — PROPRANOLOL HCL 10 MG PO TABS: ORAL_TABLET | ORAL | 1 refills | Status: DC

## 2021-09-12 LAB — QUANTIFERON-TB GOLD PLUS
QuantiFERON Mitogen Value: >10 IU/mL
QuantiFERON Nil Value: 0 IU/mL
QuantiFERON TB1 Ag Value: 0 IU/mL
QuantiFERON TB2 Ag Value: 0 IU/mL
QuantiFERON-TB Gold Plus: NEGATIVE

## 2021-09-13 ENCOUNTER — Ambulatory Visit: Payer: Medicaid Other | Admitting: Internal Medicine

## 2021-09-18 ENCOUNTER — Encounter: Payer: Self-pay | Admitting: Internal Medicine

## 2021-09-18 ENCOUNTER — Other Ambulatory Visit: Payer: Self-pay

## 2021-09-18 ENCOUNTER — Ambulatory Visit (INDEPENDENT_AMBULATORY_CARE_PROVIDER_SITE_OTHER): Payer: Medicaid Other | Admitting: Internal Medicine

## 2021-09-18 DIAGNOSIS — R000 Tachycardia, unspecified: Secondary | ICD-10-CM

## 2021-09-18 DIAGNOSIS — I4711 Inappropriate sinus tachycardia, so stated: Secondary | ICD-10-CM | POA: Insufficient documentation

## 2021-09-18 IMAGING — DX DG TIBIA/FIBULA 2V*R*
4 series · 4 of 4 positions shown · non-contrast
Comparison: None.

CLINICAL DATA: Right lower leg pain after injury.

EXAM:
RIGHT TIBIA AND FIBULA - 2 VIEW

[dg tibia/fibula right (1 of 4)]
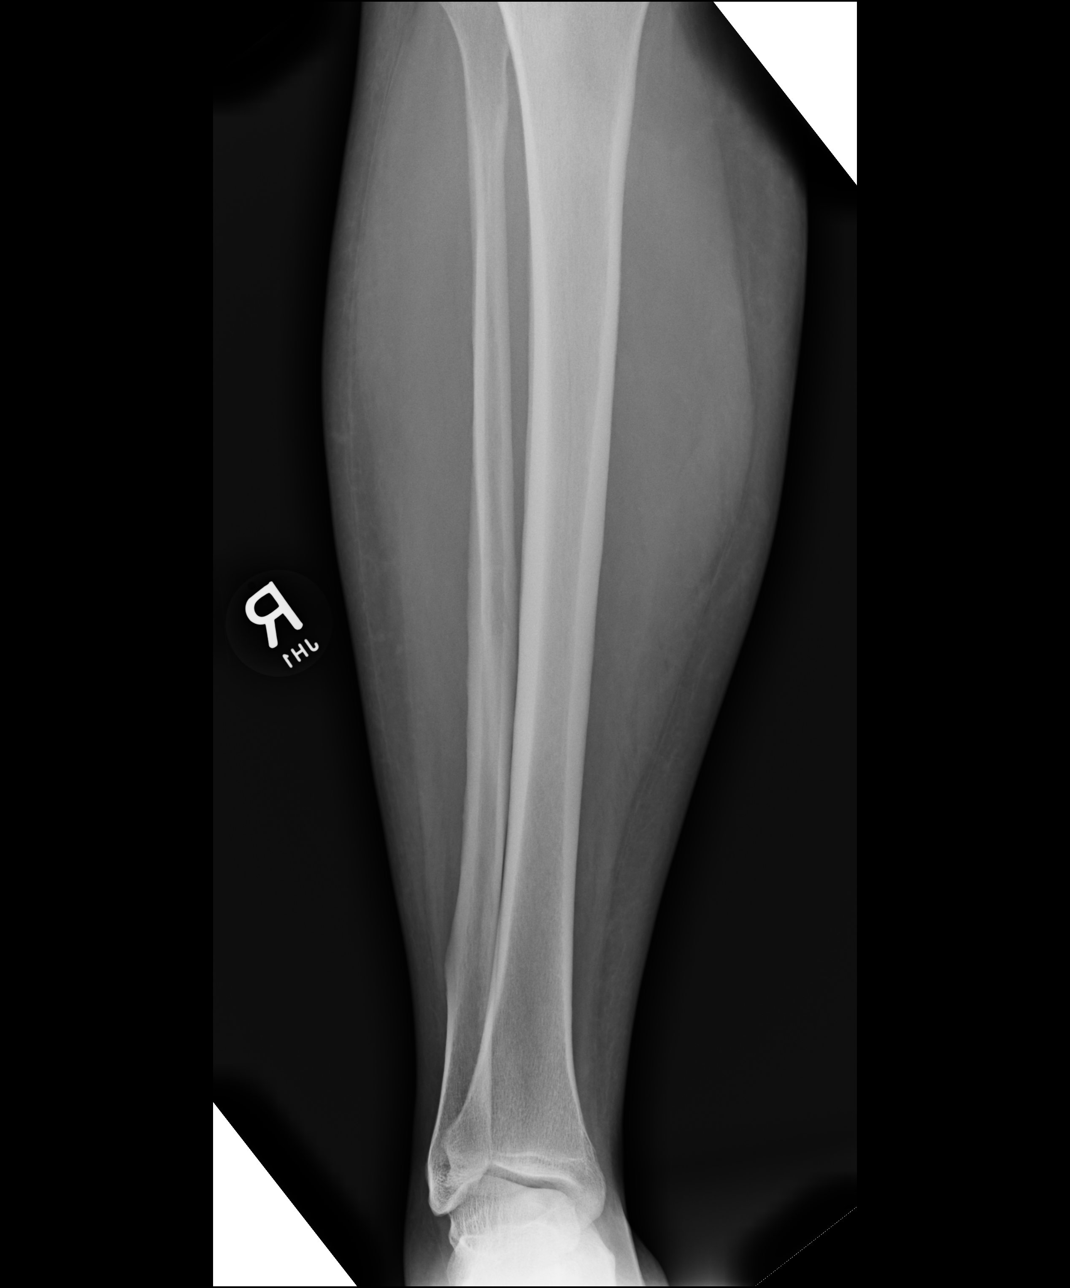

[dg tibia/fibula right (2 of 4)]
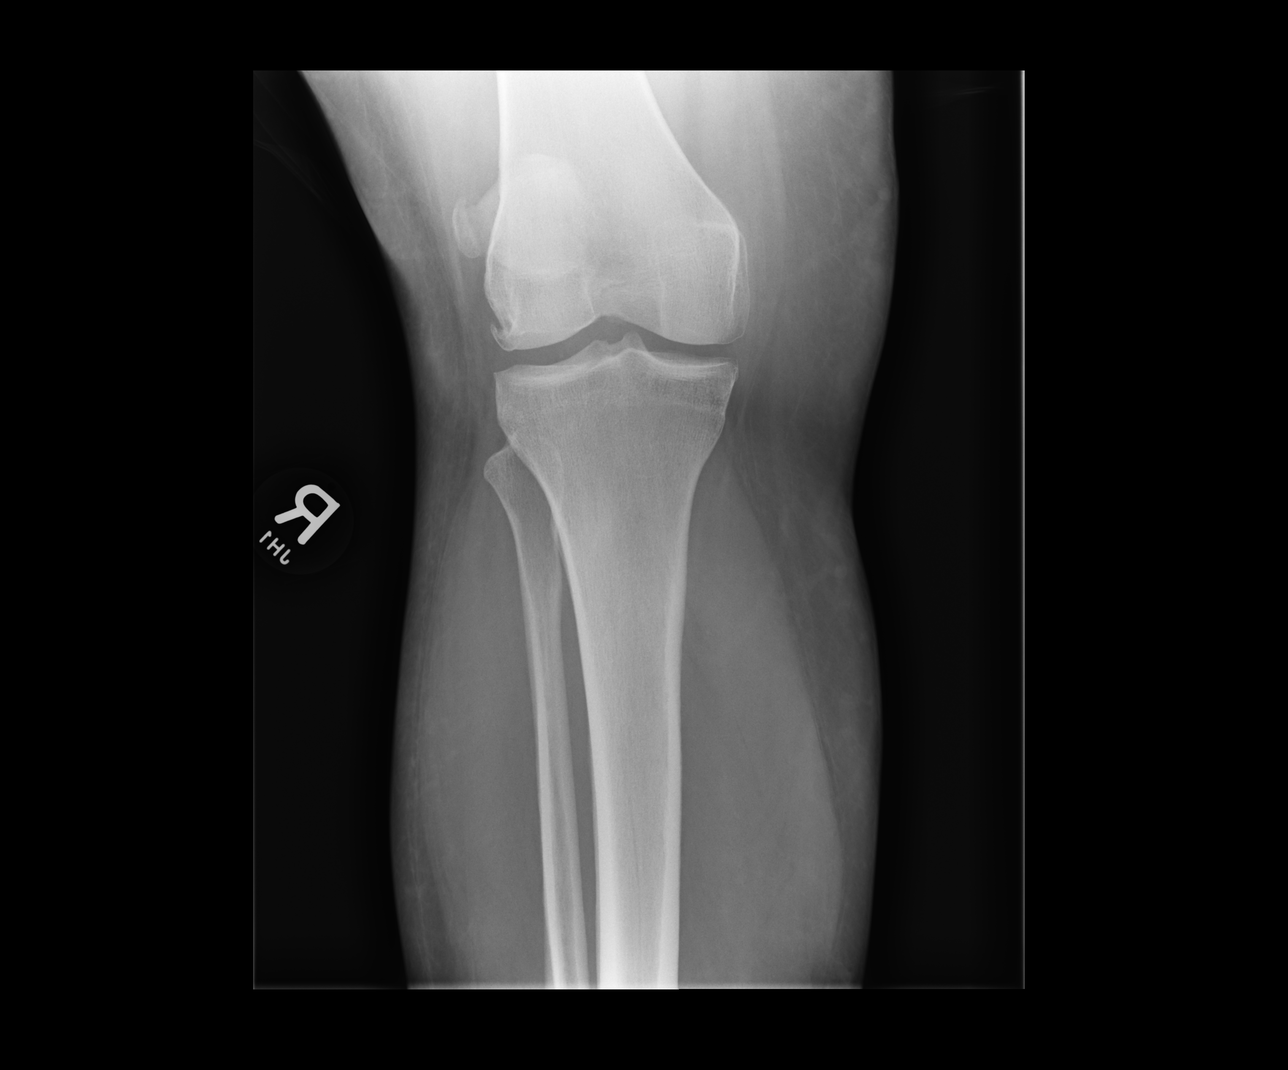

[dg tibia/fibula right (3 of 4)]
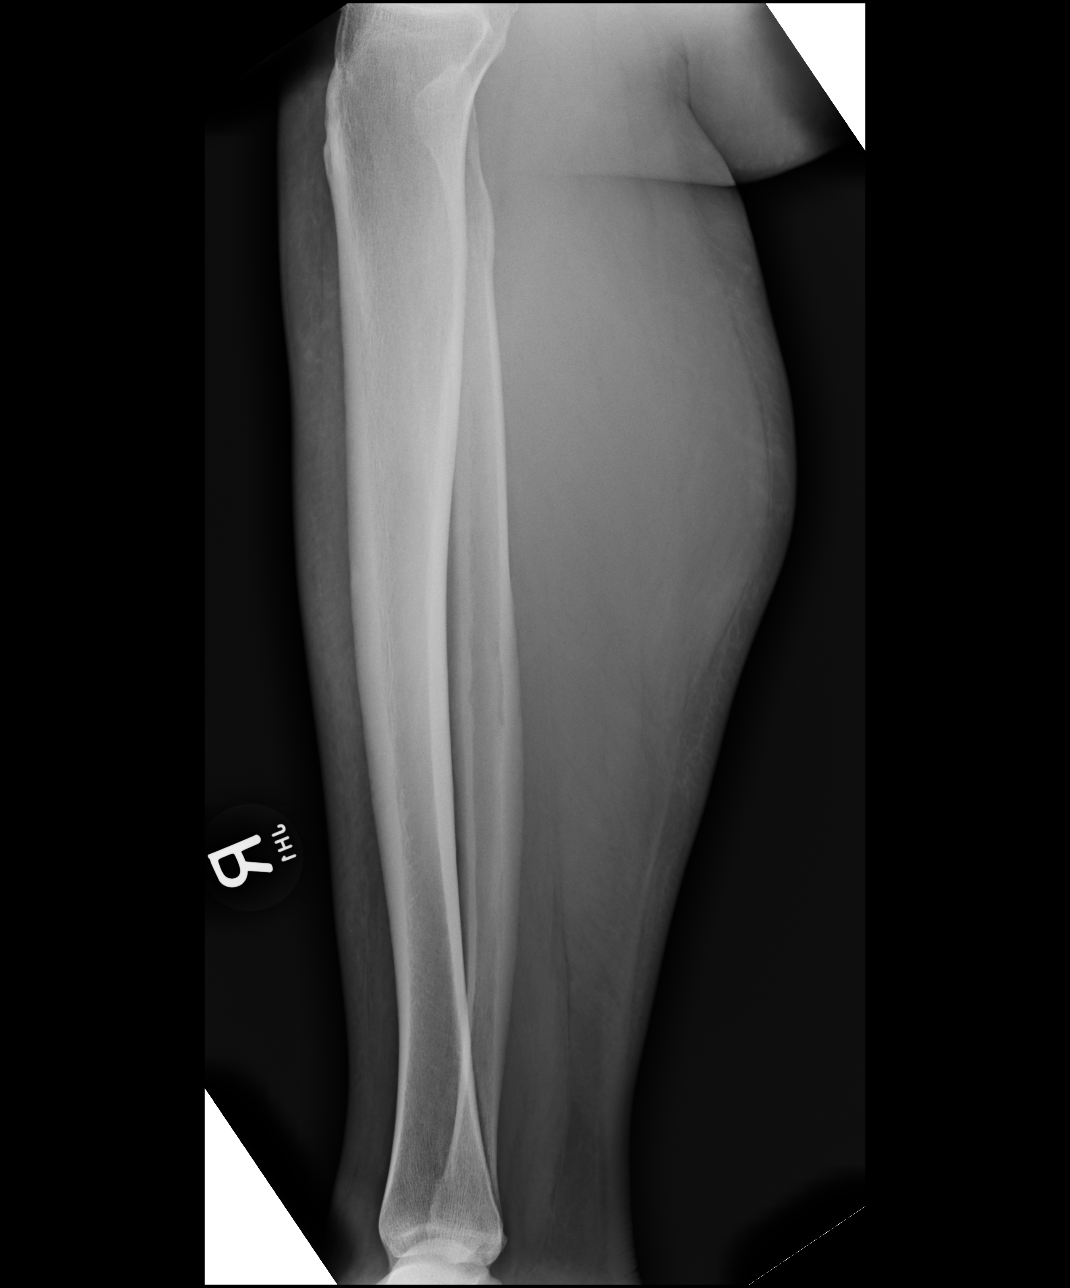

[dg tibia/fibula right (4 of 4)]
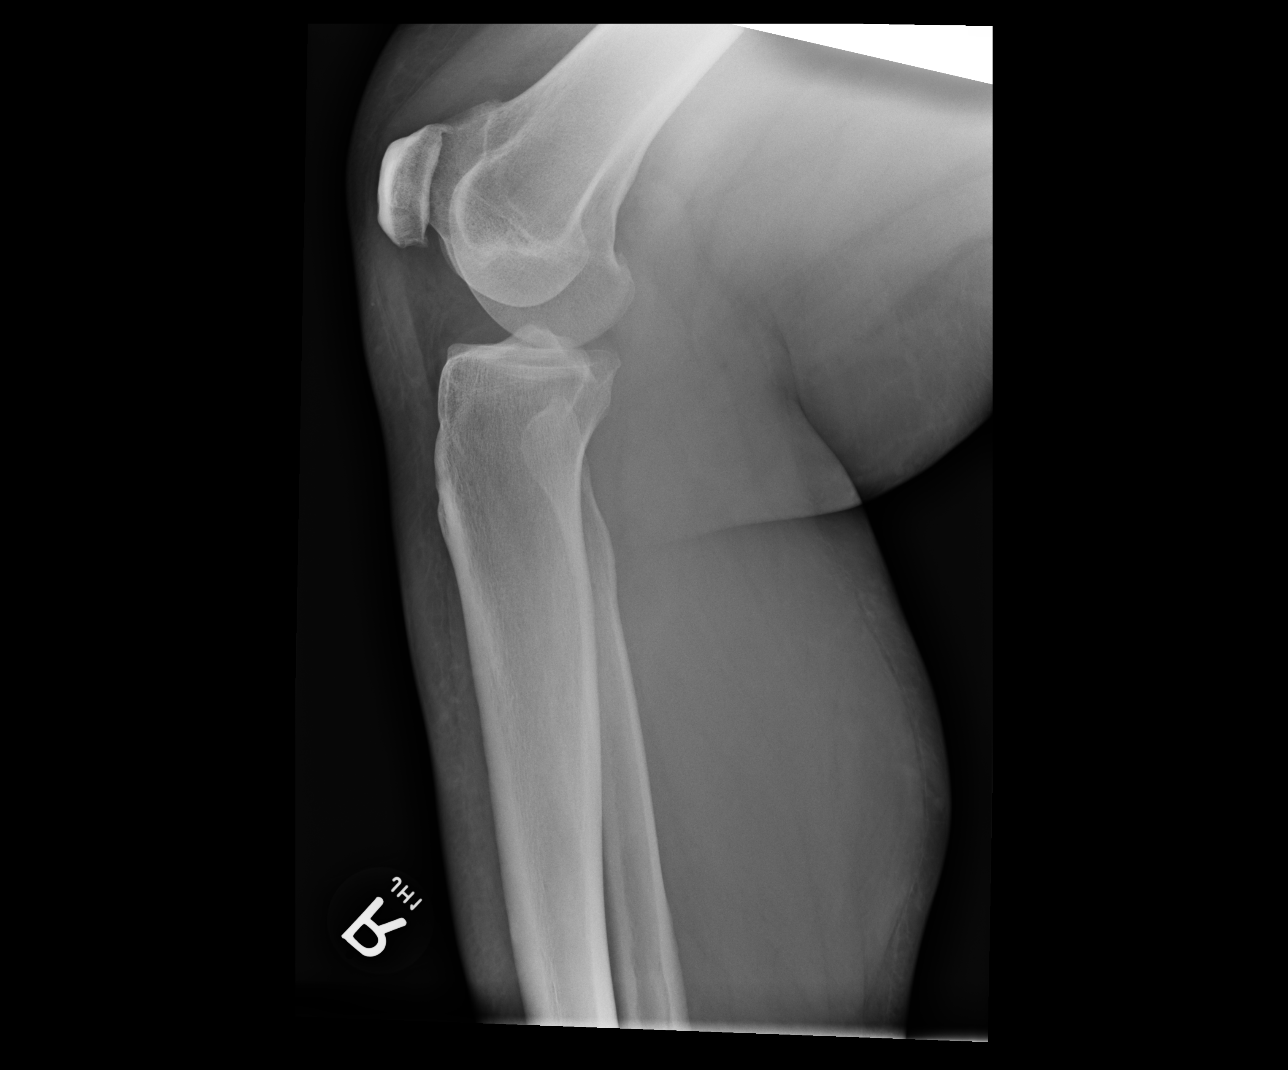

[4 of 4 positions shown; findings below may reference images not displayed]

FINDINGS: There is no evidence of fracture or other focal bone lesions. Soft
tissues are unremarkable.
IMPRESSION: Negative.

## 2021-09-18 NOTE — Patient Instructions (Signed)
Medication Instructions:  No Changes In Medications at this time.  *If you need a refill on your cardiac medications before your next appointment, please call your pharmacy*  Follow-Up: At [facility], you and your health needs are our priority.  As part of our continuing mission to provide you with exceptional heart care, we have created designated Provider Care Teams.  These Care Teams include your primary Cardiologist (physician) and Advanced Practice Providers (APPs -  Physician Assistants and Nurse Practitioners) who all work together to provide you with the care you need, when you need it.  Your next appointment:   3 month(s)  The format for your next appointment:   In Person  Provider:   Janina Mayo, MD

## 2021-09-18 NOTE — Progress Notes (Signed)
Cardiology Office Note:    Date:  09/18/2021   ID:  [name], DOB [birth year], MRN [id number]  PCP:  Minette Brine, FNP   [facility] Providers Cardiologist:  None     Referring MD: Minette Brine, FNP   No chief complaint on file. Abnormal EKG  History of Present Illness:    [name] is a 41 y.o. 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 She notes some anxiety. She is worried about that. She felt an ache around her heart during  a very stressful period.. The episodes had minimal aching briefly. No nausea. No LH, dizziness, no syncope. This was in July 2022. 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 She denies recurrent chest pain. No dyspnea no exertion. No pitting edema. She is planned for a sleep study. Her lipid levels are normal. She does not smoke. Her family history includes her father died of an MI and he had ESRD. Her mother had heart valve surgery. She passed away. Siblings are healthy. She has twins and  another child 11 months apart. She smoked cigarettes for years. She then stopped recently smoking. No etoh use. No drug use. She's a pediatric nurse.  Interim Hx: She notes typically normal heart rates. She notes had some nervousness at the time of monitoring her heart rate in clinic with her PCP. Her rates were up 118 bpm. She notes that she did not take her propanolol that day. She has not had light headedness, dizziness or syncope.    Past Medical History:  Diagnosis Date   Abnormal Pap smear    f/u wnl   Asthma    Fibroid    Gestational diabetes    Hypertension    Morbid obesity ([place])    Sleep apnea    Urinary tract infection     Past Surgical History:  Procedure Laterality Date   CESAREAN SECTION     DILATION AND CURETTAGE OF  UTERUS     TUBAL LIGATION      Current Medications: No outpatient medications have been marked as taking for the 09/18/21 encounter (Appointment) with Janina Mayo, MD.     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    Comment: BTL  Other Topics Concern   Not on file  Social History Narrative   Currently [redacted] weeks pregnant with first child (boy) Due 04/16/11         Social Determinants of Health  Financial Resource Strain: Not on file  Food Insecurity: Not on file  Transportation Needs: Not on file  Physical Activity: Not on file  Stress: Not on file  Social Connections: Not on file     Family History: The patient's family history includes Allergies in her mother; Asthma in her mother; Diabetes in her father, maternal grandmother, and mother; Heart disease in her maternal grandmother; Hypertension in her mother. There is no history of Anesthesia problems, Hypotension, Malignant hyperthermia, Pseudochol deficiency, or Hearing loss.  ROS:   Please see the history of present illness.     All other systems reviewed and are negative.  EKGs/Labs/Other Studies Reviewed:    The following studies were reviewed today:   EKG:  EKG is  ordered today.  The ekg ordered today demonstrates    NSR, LAE  Recent Labs: 05/15/2021: Hemoglobin 14.5; Platelets 271 08/21/2021: ALT 14; BUN 8; Creatinine, Ser 0.77; Potassium 4.3; Sodium 140  Recent Lipid Panel    Component Value Date/Time   CHOL 172 08/21/2021 1236   TRIG 176 (H) 08/21/2021 1236   HDL 35 (L) 08/21/2021 1236   CHOLHDL 4.9 (H) 08/21/2021 1236   LDLCALC 106 (H) 08/21/2021 1236     Risk Assessment/Calculations:           Physical Exam:    VS:   Vitals:   09/18/21 1403  BP: (!) 140/92  Pulse: 95  SpO2: 97%      Wt Readings from Last 3 Encounters:  08/21/21 (!) 335 lb (152 kg)  06/04/21 (!) 342 lb 9.6 oz (155.4 kg)  05/21/21 (!) 340 lb 3.2 oz (154.3 kg)     GEN:  Well nourished, well developed in no acute distress HEENT: Normal NECK: No JVD; No carotid bruits LYMPHATICS: No lymphadenopathy CARDIIAC: RRR, no murmurs, rubs, gallops RESPIRATORY:  Clear to auscultation without rales, wheezing or rhonchi  ABDOMEN: Soft, non-tender, non-distended MUSCULOSKELETAL:  No edema; No deformity  SKIN: Warm and dry NEUROLOGIC:  Alert and oriented x 3 PSYCHIATRIC:  Normal affect   ASSESSMENT:    #Inappropriate Sinus Tachycardia: Her EKG on initial visit was benign. She has  no symptoms of CAD or heart failure.  Tachycardia possibly related to anxiety; can continue to monitor. She has no symptoms. She does not have high risk features including syncope c/f arrhythmia , family hx of SCD. She notes recurrent tachycardia with appointments but normal rates at home. Will plan for her to continue her propanlol. Can titrate if rates persistently > 100 bpm. If her rates increase at home, she develops LH/dizziness or syncope will do cardiac event monitor.   PLAN:    In order of problems listed above:  Follow up  3 months     Medication Adjustments/Labs and Tests Ordered: Current medicines are reviewed at length with the patient today.  Concerns regarding medicines are outlined above.   Signed, Janina Mayo,  MD  09/18/2021 8:26 AM    [facility]

## 2021-10-02 ENCOUNTER — Other Ambulatory Visit: Payer: Self-pay

## 2021-10-02 MED ORDER — DAPAGLIFLOZIN PROPANEDIOL 10 MG PO TABS: 10.0000 mg | ORAL_TABLET | Freq: Every day | ORAL | 1 refills | Status: DC

## 2021-10-04 ENCOUNTER — Other Ambulatory Visit: Payer: Self-pay

## 2021-10-04 NOTE — Telephone Encounter (Signed)
Prior auth done for farxiga 10mg , waiting on a response from the [employer]. ?

## 2021-10-10 ENCOUNTER — Other Ambulatory Visit: Payer: Self-pay

## 2021-10-10 MED ORDER — FERROUS SULFATE 325 (65 FE) MG PO TBEC
325.0000 mg | DELAYED_RELEASE_TABLET | Freq: Two times a day (BID) | ORAL | 3 refills | Status: DC
Start: 2021-10-10 — End: 2022-08-05

## 2021-10-14 ENCOUNTER — Ambulatory Visit (INDEPENDENT_AMBULATORY_CARE_PROVIDER_SITE_OTHER): Payer: Medicaid Other | Admitting: Podiatry

## 2021-10-14 ENCOUNTER — Other Ambulatory Visit: Payer: Self-pay

## 2021-10-14 DIAGNOSIS — M21619 Bunion of unspecified foot: Secondary | ICD-10-CM

## 2021-10-14 DIAGNOSIS — E1165 Type 2 diabetes mellitus with hyperglycemia: Secondary | ICD-10-CM

## 2021-10-16 NOTE — Progress Notes (Signed)
Subjective: ?41 year old female presents the office today for follow-up evaluation of a painful callus to her big toe joint, medial aspect.  She is the calluses started her with the left side worse than right.  Denies any open sores, swelling or redness or any drainage. ? ?Last A1c was 8.2 on August 21, 2021 ? ?Objective: ?AAO x3, NAD ?DP/PT pulses palpable bilaterally, CRT less than 3 seconds ?Hyperkeratotic lesion medial hallux and there is some dried blood present however there is no underlying ulceration drainage or any signs of infection noted today.  Minimal hyperkeratotic tissue on the right medial nail border.  No open lesions identified today. ?Pronation is present resulting likely the hyperkeratotic lesions. ?No pain with calf compression, swelling, warmth, erythema ? ? ? ? ? ? ?Assessment: ?Preulcerative skin lesion ? ?Plan: ?-All treatment options discussed with the patient including all alternatives, risks, complications.  ?-Debrided hyperkeratotic lesion with any complications or bleeding as a courtesy but we discussed measures to help prevent callus from coming back as much.  Discussed shoes and good arch support.  Use moisturizer and offloading.   ?-Discussed surgical intervention at her request.  On x-ray she does have moderate bunion but clinically not as severe bunion.  I do think a lot of this is still coming from pronation.  Will consider surgical options. ?-Monitoring signs or symptoms of infection.  Discussed daily foot inspection continue to encourage glucose control. ? ?Trula Slade DPM ?

## 2021-10-21 ENCOUNTER — Telehealth: Payer: Medicaid Other

## 2021-10-21 ENCOUNTER — Telehealth: Payer: Self-pay

## 2021-10-21 NOTE — Telephone Encounter (Signed)
?  Care Management  ? ?Follow Up Note ? ? ?10/21/2021 ?Name: [name] MRN: [id number] DOB: 06-17-1981 ? ? ?Referred by: Minette Brine, FNP ?Reason for referral : Chronic Care Management (RN CM Follow up call ) ? ?I spoke with patient briefly today, unfortunately she is not available to speak with me but is agreeable to a call back later in the week.   ?An unsuccessful telephone outreach was attempted today. The patient was referred to the case management team for assistance with care management and care coordination.  ? ?Follow Up Plan: Telephone follow up appointment with care management team member scheduled for: 10/25/21 ? ?Barb Merino, RN, BSN, CCM ?Care Management Coordinator ?[place] [facility]  ?Direct Phone: [phone] ? ? ?

## 2021-10-24 ENCOUNTER — Telehealth: Payer: Self-pay

## 2021-10-24 ENCOUNTER — Telehealth: Payer: Medicaid Other

## 2021-10-24 NOTE — Telephone Encounter (Signed)
?  Care Management  ? ?Follow Up Note ? ? ?10/24/2021 ?Name: [name] MRN: [id number] DOB: [birth year] ? ? ?Referred by: Minette Brine, FNP ?Reason for referral : Chronic Care Management (RN CM follow up call #2) ? ? ?A second unsuccessful telephone outreach was attempted today. The patient was referred to the case management team for assistance with care management and care coordination.  ? ?Follow Up Plan: A HIPPA compliant phone message was left for the patient providing contact information and requesting a return call.  ? ?Barb Merino, RN, BSN, CCM ?Care Management Coordinator ?[place] [facility]  ?Direct Phone: [phone] ? ? ?

## 2021-11-13 ENCOUNTER — Ambulatory Visit: Payer: Self-pay

## 2021-11-13 ENCOUNTER — Telehealth: Payer: Medicaid Other

## 2021-11-13 DIAGNOSIS — E782 Mixed hyperlipidemia: Secondary | ICD-10-CM

## 2021-11-13 DIAGNOSIS — E559 Vitamin D deficiency, unspecified: Secondary | ICD-10-CM

## 2021-11-13 DIAGNOSIS — F419 Anxiety disorder, unspecified: Secondary | ICD-10-CM

## 2021-11-13 DIAGNOSIS — I10 Essential (primary) hypertension: Secondary | ICD-10-CM

## 2021-11-13 DIAGNOSIS — E1165 Type 2 diabetes mellitus with hyperglycemia: Secondary | ICD-10-CM

## 2021-11-13 NOTE — Chronic Care Management (AMB) (Signed)
?  Care Management  ? ?Follow Up Note ? ? ?11/13/2021 ?Name: [name] MRN: [id number] DOB: [birth year] ? ? ?Referred by: Minette Brine, FNP ?Reason for referral : Care Coordination (RN CC Follow up call #3) ? ? ?Third unsuccessful telephone outreach was attempted today. The patient was referred to the case management team for assistance with care management and care coordination. The patient's primary care provider has been notified of our unsuccessful attempts to make or maintain contact with the patient. The care management team is pleased to engage with this patient at any time in the future should he/she be interested in assistance from the care management team.  ? ?Follow Up Plan: We have been unable to make contact with the patient for follow up. The care management team is available to follow up with the patient after provider conversation with the patient regarding recommendation for care management engagement and subsequent re-referral to the care management team.  ? ?Barb Merino, RN, BSN, CCM ?Care Management Coordinator ?[place] [facility]  ?Direct Phone: [phone] ? ? ?

## 2021-11-18 NOTE — Patient Instructions (Addendum)
Hypertension, Adult ?Hypertension is another name for high blood pressure. High blood pressure forces your heart to work harder to pump blood. This can cause problems over time. ?There are two numbers in a blood pressure reading. There is a top number (systolic) over a bottom number (diastolic). It is best to have a blood pressure that is below 120/80. ?What are the causes? ?The cause of this condition is not known. Some other conditions can lead to high blood pressure. ?What increases the risk? ?Some lifestyle factors can make you more likely to develop high blood pressure: ?Smoking. ?Not getting enough exercise or physical activity. ?Being overweight. ?Having too much fat, sugar, calories, or salt (sodium) in your diet. ?Drinking too much alcohol. ?Other risk factors include: ?Having any of these conditions: ?Heart disease. ?Diabetes. ?High cholesterol. ?Kidney disease. ?Obstructive sleep apnea. ?Having a family history of high blood pressure and high cholesterol. ?Age. The risk increases with age. ?Stress. ?What are the signs or symptoms? ?High blood pressure may not cause symptoms. Very high blood pressure (hypertensive crisis) may cause: ?Headache. ?Fast or uneven heartbeats (palpitations). ?Shortness of breath. ?Nosebleed. ?Vomiting or feeling like you may vomit (nauseous). ?Changes in how you see. ?Very bad chest pain. ?Feeling dizzy. ?Seizures. ?How is this treated? ?This condition is treated by making healthy lifestyle changes, such as: ?Eating healthy foods. ?Exercising more. ?Drinking less alcohol. ?Your doctor may prescribe medicine if lifestyle changes do not help enough and if: ?Your top number is above 130. ?Your bottom number is above 80. ?Your personal target blood pressure may vary. ?Follow these instructions at home: ?Eating and drinking ? ?If told, follow the DASH eating plan. To follow this plan: ?Fill one half of your plate at each meal with fruits and vegetables. ?Fill one fourth of your plate  at each meal with whole grains. Whole grains include whole-wheat pasta, brown rice, and whole-grain bread. ?Eat or drink low-fat dairy products, such as skim milk or low-fat yogurt. ?Fill one fourth of your plate at each meal with low-fat (lean) proteins. Low-fat proteins include fish, chicken without skin, eggs, beans, and tofu. ?Avoid fatty meat, cured and processed meat, or chicken with skin. ?Avoid pre-made or processed food. ?Limit the amount of salt in your diet to less than 1,500 mg each day. ?Do not drink alcohol if: ?Your doctor tells you not to drink. ?You are pregnant, may be pregnant, or are planning to become pregnant. ?If you drink alcohol: ?Limit how much you have to: ?0-1 drink a day for women. ?0-2 drinks a day for men. ?Know how much alcohol is in your drink. In the [place] one drink equals one 12 oz bottle of beer (355 mL), one 5 oz glass of wine (148 mL), or one 1? oz glass of hard liquor (44 mL). ?Lifestyle ? ?Work with your doctor to stay at a healthy weight or to lose weight. Ask your doctor what the best weight is for you. ?Get at least 30 minutes of exercise that causes your heart to beat faster (aerobic exercise) most days of the week. This may include walking, swimming, or biking. ?Get at least 30 minutes of exercise that strengthens your muscles (resistance exercise) at least 3 days a week. This may include lifting weights or doing Pilates. ?Do not smoke or use any products that contain nicotine or tobacco. If you need help quitting, ask your doctor. ?Check your blood pressure at home as told by your doctor. ?Keep all follow-up visits. ?Medicines ?Take over-the-counter and prescription medicines   only as told by your doctor. Follow directions carefully. ?Do not skip doses of blood pressure medicine. The medicine does not work as well if you skip doses. Skipping doses also puts you at risk for problems. ?Ask your doctor about side effects or reactions to medicines that you should watch  for. ?Contact a doctor if: ?You think you are having a reaction to the medicine you are taking. ?You have headaches that keep coming back. ?You feel dizzy. ?You have swelling in your ankles. ?You have trouble with your vision. ?Get help right away if: ?You get a very bad headache. ?You start to feel mixed up (confused). ?You feel weak or numb. ?You feel faint. ?You have very bad pain in your: ?Chest. ?Belly (abdomen). ?You vomit more than once. ?You have trouble breathing. ?These symptoms may be an emergency. Get help right away. Call 911. ?Do not wait to see if the symptoms will go away. ?Do not drive yourself to the hospital. ?Summary ?Hypertension is another name for high blood pressure. ?High blood pressure forces your heart to work harder to pump blood. ?For most people, a normal blood pressure is less than 120/80. ?Making healthy choices can help lower blood pressure. If your blood pressure does not get lower with healthy choices, you may need to take medicine. ?This information is not intended to replace advice given to you by your health care provider. Make sure you discuss any questions you have with your health care provider. ?Document Revised: 05/09/2021 Document Reviewed: 05/09/2021 ?Elsevier Patient Education ? [place]. ? ?Type 2 Diabetes Mellitus, Diagnosis, Adult ?Type 2 diabetes (type 2 diabetes mellitus) is a long-term (chronic) disease. It may happen when there is one or both of these problems: ?The pancreas does not make enough insulin. ?The body does not react in a normal way to insulin that it makes. ?Insulin lets sugars go into cells in your body. If you have type 2 diabetes, sugars cannot get into your cells. Sugars build up in the blood. This causes high blood sugar. ?What are the causes? ?The exact cause of this condition is not known. ?What increases the risk? ?Having type 2 diabetes in your family. ?Being overweight or very overweight. ?Not being active. ?Your body not reacting  in a normal way to the insulin it makes. ?Having higher than normal blood sugar over time. ?Having a type of diabetes when you were pregnant. ?Having a condition that causes small fluid-filled sacs on your ovaries. ?What are the signs or symptoms? ?At first, you may have no symptoms. You will get symptoms slowly. They may include: ?More thirst than normal. ?More hunger than normal. ?Needing to pee more than normal. ?Losing weight without trying. ?Feeling tired. ?Feeling weak. ?Seeing things blurry. ?Dark patches on your skin. ?How is this treated? ?This condition may be treated by a diabetes expert. You may need to: ?Follow an eating plan made by a food expert (dietitian). ?Get regular exercise. ?Find ways to deal with stress. ?Check blood sugar as often as told. ?Take medicines. ?Your doctor will set treatment goals for you. Your blood sugar should be at these levels: ?Before meals: 80-130 mg/dL (4.4-7.2 mmol/L). ?After meals: below 180 mg/dL (10 mmol/L). ?Over the last 2-3 months: less than 7%. ?Follow these instructions at home: ?Medicines ?Take your diabetes medicines or insulin every day. ?Take medicines as told to help you prevent other problems caused by this condition. You may need: ?Aspirin. ?Medicine to lower cholesterol. ?Medicine to control blood pressure. ?Questions to  ask your doctor ?Should I meet with a diabetes educator? ?What medicines do I need, and when should I take them? ?What will I need to treat my condition at home? ?When should I check my blood sugar? ?Where can I find a support group? ?Who can I call if I have questions? ?When is my next doctor visit? ?General instructions ?Take over-the-counter and prescription medicines only as told by your doctor. ?Keep all follow-up visits. ?Where to find more information ?For help and guidance and more information about diabetes, please go to: ?American Diabetes Association (ADA): www.diabetes.org ?American Association of Diabetes Care and Education  Specialists (ADCES): www.diabeteseducator.org ?International Diabetes Federation (IDF): MemberVerification.ca ?Contact a doctor if: ?Your blood sugar is at or above 240 mg/dL (13.3 mmol/L) for 2 days in a row. ?You ha

## 2021-11-18 NOTE — Progress Notes (Signed)
?This visit occurred during the SARS-CoV-2 public health emergency.  Safety protocols were in place, including screening questions prior to the visit, additional usage of staff PPE, and extensive cleaning of exam room while observing appropriate contact time as indicated for disinfecting solutions. ? ?Subjective:  ?  ? Patient ID: [name] , female    DOB: 09-07-1980 , 41 y.o.   MRN: [id number] ? ? ?Chief Complaint  ?Patient presents with  ? Diabetes  ? Hypertension  ? ? ?HPI ? ?Patient presents today for a dm and htn f/u. She had covid on 08/08/2021 with shortness of breath and treated at ER.  She took prednisone for 5 days. She has recently moved and has been unable to check her blood sugar due to being packed. She reports she is tired due to working long hours (14 hour days).  She has been seeing a therapist with Alfonse Spruce - with [place].  Her next appt is February 4th. Phone number is [phone]. She has been dealing with Homelessness over the last year and her anxiety level has been up.  ? ?Diabetes ?She presents for her follow-up diabetic visit. She has type 2 diabetes mellitus. Pertinent negatives for hypoglycemia include no headaches. Pertinent negatives for diabetes include no chest pain. Risk factors for coronary artery disease include obesity, sedentary lifestyle, diabetes mellitus, dyslipidemia and hypertension. Current diabetic treatment includes oral agent (dual therapy). When asked about meal planning, she reported none. She rarely participates in exercise. (She is not checking her blood sugar regularly) She does not see a podiatrist.Eye exam is not current.  ?Hypertension ?This is a chronic problem. The current episode started more than 1 year ago. Pertinent negatives include no chest pain, headaches or palpitations. Risk factors for coronary artery disease include obesity and sedentary lifestyle. There are no compliance problems.  There is no history of chronic renal disease.   ? ?Past  Medical History:  ?Diagnosis Date  ? Abnormal Pap smear   ? f/u wnl  ? Asthma   ? Fibroid   ? Gestational diabetes   ? Hypertension   ? Morbid obesity ([place])   ? Sleep apnea   ? Urinary tract infection   ?  ? ?Family History  ?Problem Relation Age of Onset  ? Diabetes Father   ? Allergies Mother   ? Asthma Mother   ? Hypertension Mother   ? Diabetes Mother   ? Diabetes Maternal Grandmother   ? Heart disease Maternal Grandmother   ? Anesthesia problems Neg Hx   ? Hypotension Neg Hx   ? Malignant hyperthermia Neg Hx   ? Pseudochol deficiency Neg Hx   ? Hearing loss Neg Hx   ? ? ? ?Current Outpatient Medications:  ?  albuterol (PROVENTIL) (5 MG/ML) 0.5% nebulizer solution, Take 0.5 mLs (2.5 mg total) by nebulization every 6 (six) hours as needed for wheezing or shortness of breath., Disp: 20 mL, Rfl: 12 ?  albuterol (VENTOLIN HFA) 108 (90 Base) MCG/ACT inhaler, Inhale 1-2 puffs into the lungs every 6 (six) hours as needed for wheezing or shortness of breath., Disp: 18 g, Rfl: 0 ?  atorvastatin (LIPITOR) 20 MG tablet, TAKE 1 TABLET(20 MG) BY MOUTH DAILY, Disp: 90 tablet, Rfl: 1 ?  benzonatate (TESSALON) 100 MG capsule, Take 1 capsule (100 mg total) by mouth every 8 (eight) hours as needed for cough., Disp: 21 capsule, Rfl: 0 ?  blood glucose meter kit and supplies KIT, Dispense based on patient and insurance preference. Use up  to four times daily as directed. (FOR ICD-9 250.00, 250.01)., Disp: 1 each, Rfl: 0 ?  buPROPion (WELLBUTRIN XL) 300 MG 24 hr tablet, TAKE 1 TABLET(300 MG) BY MOUTH DAILY (Patient not taking: Reported on 09/18/2021), Disp: 90 tablet, Rfl: 1 ?  busPIRone (BUSPAR) 10 MG tablet, Take 1 tablet (10 mg total) by mouth daily., Disp: 30 tablet, Rfl: 2 ?  cetirizine (ZYRTEC ALLERGY) 10 MG tablet, Take 1 tablet (10 mg total) by mouth daily., Disp: 90 tablet, Rfl: 2 ?  dapagliflozin propanediol (FARXIGA) 10 MG TABS tablet, Take 1 tablet (10 mg total) by mouth daily., Disp: 90 tablet, Rfl: 1 ?  diclofenac  (VOLTAREN) 50 MG EC tablet, Take 1 tablet (50 mg total) by mouth 3 (three) times daily., Disp: 45 tablet, Rfl: 3 ?  doxycycline (VIBRAMYCIN) 100 MG capsule, Take 1 capsule (100 mg total) by mouth 2 (two) times daily. (Patient not taking: Reported on 09/18/2021), Disp: 20 capsule, Rfl: 0 ?  ferrous sulfate 325 (65 FE) MG EC tablet, Take 1 tablet (325 mg total) by mouth 3 (three) times daily with meals., Disp: 90 tablet, Rfl: 2 ?  ferrous sulfate 325 (65 FE) MG EC tablet, Take 1 tablet (325 mg total) by mouth 2 (two) times daily., Disp: 60 tablet, Rfl: 3 ?  fluticasone (FLONASE) 50 MCG/ACT nasal spray, Place 1-2 sprays into both nostrils daily., Disp: 16 g, Rfl: 0 ?  glucose blood test strip, USE TO CHECK BLOOD SUGARS UP TO 4 TIMES DAILY  DX CODE:E11.9, Disp: 100 each, Rfl: 12 ?  ibuprofen (ADVIL) 800 MG tablet, Take 800 mg by mouth 3 (three) times daily., Disp: , Rfl:  ?  insulin degludec (TRESIBA FLEXTOUCH) 100 UNIT/ML FlexTouch Pen, Inject 45 Units into the skin daily., Disp: 45 mL, Rfl: 2 ?  Insulin Pen Needle 31G X 8 MM MISC, 1 Device by Does not apply route daily., Disp: 100 each, Rfl: 1 ?  Lancets (ACCU-CHEK MULTICLIX) lancets, USE TO CHECK BLOOD SUGARS UP TO 4 TIMES DAILY  DX CODE:E11.9, Disp: 100 each, Rfl: 12 ?  levonorgestrel (MIRENA) 20 MCG/24HR IUD, 1 each by Intrauterine route once., Disp: , Rfl:  ?  losartan-hydrochlorothiazide (HYZAAR) 50-12.5 MG tablet, Take 1 tablet by mouth daily., Disp: 90 tablet, Rfl: 1 ?  metFORMIN (GLUCOPHAGE-XR) 750 MG 24 hr tablet, TAKE 1 TABLET(750 MG) BY MOUTH IN THE MORNING AND AT BEDTIME, Disp: 180 tablet, Rfl: 1 ?  metroNIDAZOLE (FLAGYL) 500 MG tablet, Take 1 tablet (500 mg total) by mouth 2 (two) times daily., Disp: 14 tablet, Rfl: 0 ?  propranolol (INDERAL) 10 MG tablet, TAKE 1 TABLET(10 MG) BY MOUTH EVERY EVENING, Disp: 90 tablet, Rfl: 1 ?  Semaglutide, 2 MG/DOSE, (OZEMPIC, 2 MG/DOSE,) 8 MG/3ML SOPN, Inject 2 mg into the skin once a week., Disp: 9 mL, Rfl: 1 ?   triamcinolone (KENALOG) 0.025 % ointment, Apply 1 application topically 2 (two) times daily., Disp: 30 g, Rfl: 0 ?  Vitamin D, Ergocalciferol, (DRISDOL) 1.25 MG (50000 UNIT) CAPS capsule, Take 1 capsule by mouth on Tues and on Fridays., Disp: 24 capsule, Rfl: 0  ? ?Allergies  ?Allergen Reactions  ? Other Anaphylaxis and Other (See Comments)  ?  Pt has fish allergy ?Pt has allergy to butter cookies  ? Penicillins Rash and Other (See Comments)  ?  Told MD that PCN reaction is rash and that she has taken amoxicillin before  ? Bactrim [Sulfamethoxazole-Trimethoprim]   ?  Diarrhea, abdominal cramps and rash  ? Fish  Allergy   ?  Other reaction(s): Unknown  ?  ? ?Review of Systems  ?Constitutional: Negative.   ?Respiratory: Negative.    ?Cardiovascular: Negative.  Negative for chest pain and palpitations.  ?Neurological: Negative.  Negative for headaches.  ?Psychiatric/Behavioral: Negative.     ? ?There were no vitals filed for this visit. ?There is no height or weight on file to calculate BMI.  ? ?Objective:  ?Physical Exam  ? ?   ?Assessment And Plan:  ?   ?1. Uncontrolled type 2 diabetes mellitus with hyperglycemia ([place]) ? ?2. Essential hypertension ? ?3. Mixed hyperlipidemia ? ?4. Class 3 severe obesity due to excess calories with serious comorbidity and body mass index (BMI) of 50.0 to 59.9 in adult [facility]) ?  ? ? ?Patient was given opportunity to ask questions. Patient verbalized understanding of the plan and was able to repeat key elements of the plan. All questions were answered to their satisfaction.  ?Debbora Dus, CMA  ? ?I, Debbora Dus, CMA, have reviewed all documentation for this visit. The documentation on 11/18/21 for the exam, diagnosis, procedures, and orders are all accurate and complete.  ? ?IF YOU HAVE BEEN REFERRED TO A SPECIALIST, IT MAY TAKE 1-2 WEEKS TO SCHEDULE/PROCESS THE REFERRAL. IF YOU HAVE NOT HEARD FROM US/SPECIALIST IN TWO WEEKS, PLEASE GIVE [place] A CALL AT [phone].   ? ?THE PATIENT IS ENCOURAGED TO PRACTICE SOCIAL DISTANCING DUE TO THE COVID-19 PANDEMIC.   ?

## 2021-11-19 ENCOUNTER — Encounter: Payer: Medicaid Other | Admitting: Nurse Practitioner

## 2021-11-19 DIAGNOSIS — I10 Essential (primary) hypertension: Secondary | ICD-10-CM

## 2021-11-19 DIAGNOSIS — E782 Mixed hyperlipidemia: Secondary | ICD-10-CM

## 2021-11-19 DIAGNOSIS — E1165 Type 2 diabetes mellitus with hyperglycemia: Secondary | ICD-10-CM

## 2021-12-01 ENCOUNTER — Other Ambulatory Visit: Payer: Self-pay | Admitting: Nurse Practitioner

## 2021-12-01 DIAGNOSIS — I10 Essential (primary) hypertension: Secondary | ICD-10-CM

## 2021-12-10 ENCOUNTER — Encounter: Payer: Medicaid Other | Admitting: Nurse Practitioner

## 2021-12-10 NOTE — Progress Notes (Deleted)
?This visit occurred during the SARS-CoV-2 public health emergency.  Safety protocols were in place, including screening questions prior to the visit, additional usage of staff PPE, and extensive cleaning of exam room while observing appropriate contact time as indicated for disinfecting solutions. ? ?Subjective:  ?  ? Patient ID: [name] , female    DOB: May 29, 1981 , 41 y.o.   MRN: [id number] ? ? ?No chief complaint on file. ? ? ?HPI ? ?Pt here today for bp & dm f/u.  ?  ? ?Past Medical History:  ?Diagnosis Date  ? Abnormal Pap smear   ? f/u wnl  ? Asthma   ? Fibroid   ? Gestational diabetes   ? Hypertension   ? Morbid obesity ([place])   ? Sleep apnea   ? Urinary tract infection   ?  ? ?Family History  ?Problem Relation Age of Onset  ? Diabetes Father   ? Allergies Mother   ? Asthma Mother   ? Hypertension Mother   ? Diabetes Mother   ? Diabetes Maternal Grandmother   ? Heart disease Maternal Grandmother   ? Anesthesia problems Neg Hx   ? Hypotension Neg Hx   ? Malignant hyperthermia Neg Hx   ? Pseudochol deficiency Neg Hx   ? Hearing loss Neg Hx   ? ? ? ?Current Outpatient Medications:  ?  albuterol (PROVENTIL) (5 MG/ML) 0.5% nebulizer solution, Take 0.5 mLs (2.5 mg total) by nebulization every 6 (six) hours as needed for wheezing or shortness of breath., Disp: 20 mL, Rfl: 12 ?  albuterol (VENTOLIN HFA) 108 (90 Base) MCG/ACT inhaler, Inhale 1-2 puffs into the lungs every 6 (six) hours as needed for wheezing or shortness of breath., Disp: 18 g, Rfl: 0 ?  atorvastatin (LIPITOR) 20 MG tablet, TAKE 1 TABLET(20 MG) BY MOUTH DAILY, Disp: 90 tablet, Rfl: 1 ?  benzonatate (TESSALON) 100 MG capsule, Take 1 capsule (100 mg total) by mouth every 8 (eight) hours as needed for cough., Disp: 21 capsule, Rfl: 0 ?  blood glucose meter kit and supplies KIT, Dispense based on patient and insurance preference. Use up to four times daily as directed. (FOR ICD-9 250.00, 250.01)., Disp: 1 each, Rfl: 0 ?  buPROPion (WELLBUTRIN XL)  300 MG 24 hr tablet, TAKE 1 TABLET(300 MG) BY MOUTH DAILY (Patient not taking: Reported on 09/18/2021), Disp: 90 tablet, Rfl: 1 ?  busPIRone (BUSPAR) 10 MG tablet, Take 1 tablet (10 mg total) by mouth daily., Disp: 30 tablet, Rfl: 2 ?  cetirizine (ZYRTEC ALLERGY) 10 MG tablet, Take 1 tablet (10 mg total) by mouth daily., Disp: 90 tablet, Rfl: 2 ?  dapagliflozin propanediol (FARXIGA) 10 MG TABS tablet, Take 1 tablet (10 mg total) by mouth daily., Disp: 90 tablet, Rfl: 1 ?  diclofenac (VOLTAREN) 50 MG EC tablet, Take 1 tablet (50 mg total) by mouth 3 (three) times daily., Disp: 45 tablet, Rfl: 3 ?  doxycycline (VIBRAMYCIN) 100 MG capsule, Take 1 capsule (100 mg total) by mouth 2 (two) times daily. (Patient not taking: Reported on 09/18/2021), Disp: 20 capsule, Rfl: 0 ?  ferrous sulfate 325 (65 FE) MG EC tablet, Take 1 tablet (325 mg total) by mouth 3 (three) times daily with meals., Disp: 90 tablet, Rfl: 2 ?  ferrous sulfate 325 (65 FE) MG EC tablet, Take 1 tablet (325 mg total) by mouth 2 (two) times daily., Disp: 60 tablet, Rfl: 3 ?  fluticasone (FLONASE) 50 MCG/ACT nasal spray, Place 1-2 sprays into both nostrils  daily., Disp: 16 g, Rfl: 0 ?  glucose blood test strip, USE TO CHECK BLOOD SUGARS UP TO 4 TIMES DAILY  DX CODE:E11.9, Disp: 100 each, Rfl: 12 ?  ibuprofen (ADVIL) 800 MG tablet, Take 800 mg by mouth 3 (three) times daily., Disp: , Rfl:  ?  insulin degludec (TRESIBA FLEXTOUCH) 100 UNIT/ML FlexTouch Pen, Inject 45 Units into the skin daily., Disp: 45 mL, Rfl: 2 ?  Insulin Pen Needle 31G X 8 MM MISC, 1 Device by Does not apply route daily., Disp: 100 each, Rfl: 1 ?  Lancets (ACCU-CHEK MULTICLIX) lancets, USE TO CHECK BLOOD SUGARS UP TO 4 TIMES DAILY  DX CODE:E11.9, Disp: 100 each, Rfl: 12 ?  levonorgestrel (MIRENA) 20 MCG/24HR IUD, 1 each by Intrauterine route once., Disp: , Rfl:  ?  losartan-hydrochlorothiazide (HYZAAR) 50-12.5 MG tablet, TAKE 1 TABLET BY MOUTH DAILY, Disp: 90 tablet, Rfl: 1 ?  metFORMIN  (GLUCOPHAGE-XR) 750 MG 24 hr tablet, TAKE 1 TABLET(750 MG) BY MOUTH IN THE MORNING AND AT BEDTIME, Disp: 180 tablet, Rfl: 1 ?  metroNIDAZOLE (FLAGYL) 500 MG tablet, Take 1 tablet (500 mg total) by mouth 2 (two) times daily., Disp: 14 tablet, Rfl: 0 ?  propranolol (INDERAL) 10 MG tablet, TAKE 1 TABLET(10 MG) BY MOUTH EVERY EVENING, Disp: 90 tablet, Rfl: 1 ?  Semaglutide, 2 MG/DOSE, (OZEMPIC, 2 MG/DOSE,) 8 MG/3ML SOPN, Inject 2 mg into the skin once a week., Disp: 9 mL, Rfl: 1 ?  triamcinolone (KENALOG) 0.025 % ointment, Apply 1 application topically 2 (two) times daily., Disp: 30 g, Rfl: 0 ?  Vitamin D, Ergocalciferol, (DRISDOL) 1.25 MG (50000 UNIT) CAPS capsule, Take 1 capsule by mouth on Tues and on Fridays., Disp: 24 capsule, Rfl: 0  ? ?Allergies  ?Allergen Reactions  ? Other Anaphylaxis and Other (See Comments)  ?  Pt has fish allergy ?Pt has allergy to butter cookies  ? Penicillins Rash and Other (See Comments)  ?  Told MD that PCN reaction is rash and that she has taken amoxicillin before  ? Bactrim [Sulfamethoxazole-Trimethoprim]   ?  Diarrhea, abdominal cramps and rash  ? Fish Allergy   ?  Other reaction(s): Unknown  ?  ? ?Review of Systems  ?Constitutional: Negative.   ?Respiratory: Negative.    ?Cardiovascular: Negative.   ?Neurological: Negative.   ?Psychiatric/Behavioral: Negative.     ? ?There were no vitals filed for this visit. ?There is no height or weight on file to calculate BMI.  ? ?Objective:  ?Physical Exam  ? ?   ?Assessment And Plan:  ?   ?1. Essential hypertension ? ?2. Uncontrolled type 2 diabetes mellitus with hyperglycemia ([place]) ? ?3. Mixed hyperlipidemia ? ?4. Class 3 severe obesity due to excess calories with serious comorbidity and body mass index (BMI) of 50.0 to 59.9 in adult [facility]) ?  ? ? ?Patient was given opportunity to ask questions. Patient verbalized understanding of the plan and was able to repeat key elements of the plan. All questions were answered to their satisfaction.   ?Debbora Dus, CMA  ? ?I, Debbora Dus, CMA, have reviewed all documentation for this visit. The documentation on 12/10/21 for the exam, diagnosis, procedures, and orders are all accurate and complete.  ? ?IF YOU HAVE BEEN REFERRED TO A SPECIALIST, IT MAY TAKE 1-2 WEEKS TO SCHEDULE/PROCESS THE REFERRAL. IF YOU HAVE NOT HEARD FROM US/SPECIALIST IN TWO WEEKS, PLEASE GIVE [place] A CALL AT [phone] X [phone].  ? ?THE PATIENT IS ENCOURAGED TO PRACTICE  SOCIAL DISTANCING DUE TO THE COVID-19 PANDEMIC.   ?

## 2021-12-10 NOTE — Patient Instructions (Signed)
Hypertension, Adult ?Hypertension is another name for high blood pressure. High blood pressure forces your heart to work harder to pump blood. This can cause problems over time. ?There are two numbers in a blood pressure reading. There is a top number (systolic) over a bottom number (diastolic). It is best to have a blood pressure that is below 120/80. ?What are the causes? ?The cause of this condition is not known. Some other conditions can lead to high blood pressure. ?What increases the risk? ?Some lifestyle factors can make you more likely to develop high blood pressure: ?Smoking. ?Not getting enough exercise or physical activity. ?Being overweight. ?Having too much fat, sugar, calories, or salt (sodium) in your diet. ?Drinking too much alcohol. ?Other risk factors include: ?Having any of these conditions: ?Heart disease. ?Diabetes. ?High cholesterol. ?Kidney disease. ?Obstructive sleep apnea. ?Having a family history of high blood pressure and high cholesterol. ?Age. The risk increases with age. ?Stress. ?What are the signs or symptoms? ?High blood pressure may not cause symptoms. Very high blood pressure (hypertensive crisis) may cause: ?Headache. ?Fast or uneven heartbeats (palpitations). ?Shortness of breath. ?Nosebleed. ?Vomiting or feeling like you may vomit (nauseous). ?Changes in how you see. ?Very bad chest pain. ?Feeling dizzy. ?Seizures. ?How is this treated? ?This condition is treated by making healthy lifestyle changes, such as: ?Eating healthy foods. ?Exercising more. ?Drinking less alcohol. ?Your doctor may prescribe medicine if lifestyle changes do not help enough and if: ?Your top number is above 130. ?Your bottom number is above 80. ?Your personal target blood pressure may vary. ?Follow these instructions at home: ?Eating and drinking ? ?If told, follow the DASH eating plan. To follow this plan: ?Fill one half of your plate at each meal with fruits and vegetables. ?Fill one fourth of your plate  at each meal with whole grains. Whole grains include whole-wheat pasta, brown rice, and whole-grain bread. ?Eat or drink low-fat dairy products, such as skim milk or low-fat yogurt. ?Fill one fourth of your plate at each meal with low-fat (lean) proteins. Low-fat proteins include fish, chicken without skin, eggs, beans, and tofu. ?Avoid fatty meat, cured and processed meat, or chicken with skin. ?Avoid pre-made or processed food. ?Limit the amount of salt in your diet to less than 1,500 mg each day. ?Do not drink alcohol if: ?Your doctor tells you not to drink. ?You are pregnant, may be pregnant, or are planning to become pregnant. ?If you drink alcohol: ?Limit how much you have to: ?0-1 drink a day for women. ?0-2 drinks a day for men. ?Know how much alcohol is in your drink. In the [place] one drink equals one 12 oz bottle of beer (355 mL), one 5 oz glass of wine (148 mL), or one 1? oz glass of hard liquor (44 mL). ?Lifestyle ? ?Work with your doctor to stay at a healthy weight or to lose weight. Ask your doctor what the best weight is for you. ?Get at least 30 minutes of exercise that causes your heart to beat faster (aerobic exercise) most days of the week. This may include walking, swimming, or biking. ?Get at least 30 minutes of exercise that strengthens your muscles (resistance exercise) at least 3 days a week. This may include lifting weights or doing Pilates. ?Do not smoke or use any products that contain nicotine or tobacco. If you need help quitting, ask your doctor. ?Check your blood pressure at home as told by your doctor. ?Keep all follow-up visits. ?Medicines ?Take over-the-counter and prescription medicines   only as told by your doctor. Follow directions carefully. ?Do not skip doses of blood pressure medicine. The medicine does not work as well if you skip doses. Skipping doses also puts you at risk for problems. ?Ask your doctor about side effects or reactions to medicines that you should watch  for. ?Contact a doctor if: ?You think you are having a reaction to the medicine you are taking. ?You have headaches that keep coming back. ?You feel dizzy. ?You have swelling in your ankles. ?You have trouble with your vision. ?Get help right away if: ?You get a very bad headache. ?You start to feel mixed up (confused). ?You feel weak or numb. ?You feel faint. ?You have very bad pain in your: ?Chest. ?Belly (abdomen). ?You vomit more than once. ?You have trouble breathing. ?These symptoms may be an emergency. Get help right away. Call 911. ?Do not wait to see if the symptoms will go away. ?Do not drive yourself to the hospital. ?Summary ?Hypertension is another name for high blood pressure. ?High blood pressure forces your heart to work harder to pump blood. ?For most people, a normal blood pressure is less than 120/80. ?Making healthy choices can help lower blood pressure. If your blood pressure does not get lower with healthy choices, you may need to take medicine. ?This information is not intended to replace advice given to you by your health care provider. Make sure you discuss any questions you have with your health care provider. ?Document Revised: 05/09/2021 Document Reviewed: 05/09/2021 ?Elsevier Patient Education ? 2023 Elsevier Inc. ? ?

## 2021-12-12 ENCOUNTER — Emergency Department (HOSPITAL_BASED_OUTPATIENT_CLINIC_OR_DEPARTMENT_OTHER)
Admission: EM | Admit: 2021-12-12 | Discharge: 2021-12-12 | Disposition: A | Discharge status: Home / Self Care | Payer: Medicaid Other | Attending: Emergency Medicine | Admitting: Emergency Medicine

## 2021-12-12 ENCOUNTER — Encounter (HOSPITAL_BASED_OUTPATIENT_CLINIC_OR_DEPARTMENT_OTHER): Payer: Self-pay | Admitting: Emergency Medicine

## 2021-12-12 ENCOUNTER — Other Ambulatory Visit: Payer: Self-pay

## 2021-12-12 DIAGNOSIS — R197 Diarrhea, unspecified: Secondary | ICD-10-CM | POA: Insufficient documentation

## 2021-12-12 DIAGNOSIS — R1030 Lower abdominal pain, unspecified: Secondary | ICD-10-CM | POA: Diagnosis present

## 2021-12-12 DIAGNOSIS — N111 Chronic obstructive pyelonephritis: Secondary | ICD-10-CM | POA: Diagnosis not present

## 2021-12-12 DIAGNOSIS — R509 Fever, unspecified: Secondary | ICD-10-CM | POA: Insufficient documentation

## 2021-12-12 DIAGNOSIS — R110 Nausea: Secondary | ICD-10-CM | POA: Diagnosis not present

## 2021-12-12 DIAGNOSIS — N12 Tubulo-interstitial nephritis, not specified as acute or chronic: Secondary | ICD-10-CM

## 2021-12-12 DIAGNOSIS — Z794 Long term (current) use of insulin: Secondary | ICD-10-CM | POA: Diagnosis not present

## 2021-12-12 LAB — URINALYSIS, ROUTINE W REFLEX MICROSCOPIC
Glucose, UA: NEGATIVE mg/dL
Ketones, ur: NEGATIVE mg/dL
Leukocytes,Ua: NEGATIVE
Nitrite: NEGATIVE
Protein, ur: >=300 mg/dL — AB
Specific Gravity, Urine: >=1.03 (ref 1.005–1.030)
pH: 6 (ref 5.0–8.0)

## 2021-12-12 LAB — CBC
HCT: 42.6 % (ref 36.0–46.0)
Hemoglobin: 13.8 g/dL (ref 12.0–15.0)
MCH: 28.8 pg (ref 26.0–34.0)
MCHC: 32.4 g/dL (ref 30.0–36.0)
MCV: 88.8 fL (ref 80.0–100.0)
Platelets: 261 10*3/uL (ref 150–400)
RBC: 4.8 MIL/uL (ref 3.87–5.11)
RDW: 13 % (ref 11.5–15.5)
WBC: 5.7 10*3/uL (ref 4.0–10.5)
nRBC: 0 % (ref 0.0–0.2)

## 2021-12-12 LAB — BASIC METABOLIC PANEL
Anion gap: 7 (ref 5–15)
BUN: 14 mg/dL (ref 6–20)
CO2: 27 mmol/L (ref 22–32)
Calcium: 9.1 mg/dL (ref 8.9–10.3)
Chloride: 105 mmol/L (ref 98–111)
Creatinine, Ser: 0.65 mg/dL (ref 0.44–1.00)
GFR, Estimated: >60 mL/min (ref >60)
Glucose, Bld: 168 mg/dL — ABNORMAL HIGH (ref 70–99)
Potassium: 4.2 mmol/L (ref 3.5–5.1)
Sodium: 139 mmol/L (ref 135–145)

## 2021-12-12 LAB — URINALYSIS, MICROSCOPIC (REFLEX)

## 2021-12-12 MED ORDER — FLUCONAZOLE 150 MG PO TABS: 150.0000 mg | ORAL_TABLET | Freq: Every day | ORAL | 0 refills | Status: DC

## 2021-12-12 MED ORDER — CIPROFLOXACIN HCL 500 MG PO TABS
500.0000 mg | ORAL_TABLET | Freq: Once | ORAL | Status: AC
  Administered 2021-12-12: 500 mg via ORAL
  Filled 2021-12-12: qty 1

## 2021-12-12 MED ORDER — CIPROFLOXACIN HCL 500 MG PO TABS: 500.0000 mg | ORAL_TABLET | Freq: Two times a day (BID) | ORAL | 0 refills | Status: DC

## 2021-12-12 MED ORDER — CIPROFLOXACIN HCL 500 MG PO TABS: 500.0000 mg | ORAL_TABLET | Freq: Two times a day (BID) | ORAL | 0 refills | Status: AC

## 2021-12-12 NOTE — ED Provider Notes (Signed)
?[facility] ?Provider Note ? ? ?CSN: [id number] ?Arrival date & time: 12/12/21  8832 ? ?  ? ?History ? ?Chief Complaint  ?Patient presents with  ? Abdominal Pain  ? Back Pain  ? ? ?[name] is a 41 y.o. female. ? ?Patient presents with lower abdominal and back pain.  Reports that she for started feeling "uncomfortable" on Monday of this week but then Tuesday noticed considerable discomfort with urination.  She also noticed pain in her back.  She reports she has a long history of UTIs and feels similar to previous UTIs.  Reports she has been febrile and woke up this morning at 4 AM with a fever and took 800 mg of ibuprofen.  Also reports nausea but no vomiting.  Reports that she has been having issues with constipation but had a bowel movement and is now having diarrhea.  Denies any sick contacts. ? ? ?  ? ?Home Medications ?Prior to Admission medications   ?Medication Sig Start Date End Date Taking? Authorizing Provider  ?albuterol (PROVENTIL) (5 MG/ML) 0.5% nebulizer solution Take 0.5 mLs (2.5 mg total) by nebulization every 6 (six) hours as needed for wheezing or shortness of breath. 04/15/19  Yes Augusto Gamble B, NP  ?albuterol (VENTOLIN HFA) 108 (90 Base) MCG/ACT inhaler Inhale 1-2 puffs into the lungs every 6 (six) hours as needed for wheezing or shortness of breath. 07/23/20  Yes Wieters, Hallie C, [name]  ?atorvastatin (LIPITOR) 20 MG tablet TAKE 1 TABLET(20 MG) BY MOUTH DAILY 04/26/21  Yes Minette Brine, FNP  ?benzonatate (TESSALON) 100 MG capsule Take 1 capsule (100 mg total) by mouth every 8 (eight) hours as needed for cough. 08/08/21  Yes Teodora Medici, FNP  ?blood glucose meter kit and supplies KIT Dispense based on patient and insurance preference. Use up to four times daily as directed. (FOR ICD-9 250.00, 250.01). 07/23/20  Yes Wieters, Hallie C, [name]  ?buPROPion (WELLBUTRIN XL) 300 MG 24 hr tablet TAKE 1 TABLET(300 MG) BY MOUTH DAILY 04/18/21  Yes Ghumman, Ramandeep, NP   ?busPIRone (BUSPAR) 10 MG tablet Take 1 tablet (10 mg total) by mouth daily. 08/21/21  Yes Minette Brine, FNP  ?cetirizine (ZYRTEC ALLERGY) 10 MG tablet Take 1 tablet (10 mg total) by mouth daily. 04/26/21  Yes Ghumman, Ramandeep, NP  ?dapagliflozin propanediol (FARXIGA) 10 MG TABS tablet Take 1 tablet (10 mg total) by mouth daily. 10/02/21  Yes Minette Brine, FNP  ?ferrous sulfate 325 (65 FE) MG EC tablet Take 1 tablet (325 mg total) by mouth 2 (two) times daily. 10/10/21 10/10/22 Yes Minette Brine, FNP  ?fluticasone (FLONASE) 50 MCG/ACT nasal spray Place 1-2 sprays into both nostrils daily. 04/18/21  Yes Ghumman, Ramandeep, NP  ?glucose blood test strip USE TO CHECK BLOOD SUGARS UP TO 4 TIMES DAILY ? DX CODE:E11.9 05/05/19  Yes Rodriguez-Southworth, Sunday Spillers, [name]  ?ibuprofen (ADVIL) 800 MG tablet Take 800 mg by mouth 3 (three) times daily. 07/11/20  Yes [provider]  ?insulin degludec (TRESIBA FLEXTOUCH) 100 UNIT/ML FlexTouch Pen Inject 45 Units into the skin daily. 04/18/21  Yes Bary Castilla, NP  ?Insulin Pen Needle 31G X 8 MM MISC 1 Device by Does not apply route daily. 11/30/20  Yes Shamleffer, Melanie Crazier, MD  ?Lancets (ACCU-CHEK MULTICLIX) lancets USE TO CHECK BLOOD SUGARS UP TO 4 TIMES DAILY ? DX CODE:E11.9 05/05/19  Yes Rodriguez-Southworth, Sunday Spillers, [name]  ?levonorgestrel (MIRENA) 20 MCG/24HR IUD 1 each by Intrauterine route once.   Yes [provider]  ?  losartan-hydrochlorothiazide (HYZAAR) 50-12.5 MG tablet TAKE 1 TABLET BY MOUTH DAILY 12/02/21 03/02/22 Yes Minette Brine, FNP  ?metFORMIN (GLUCOPHAGE-XR) 750 MG 24 hr tablet TAKE 1 TABLET(750 MG) BY MOUTH IN THE MORNING AND AT BEDTIME 09/09/21  Yes Minette Brine, FNP  ?metroNIDAZOLE (FLAGYL) 500 MG tablet Take 1 tablet (500 mg total) by mouth 2 (two) times daily. 05/15/21  Yes Fondaw, Ova Freshwater S, PA  ?propranolol (INDERAL) 10 MG tablet TAKE 1 TABLET(10 MG) BY MOUTH EVERY EVENING 09/11/21  Yes Minette Brine, FNP  ?Semaglutide, 2 MG/DOSE, (OZEMPIC, 2  MG/DOSE,) 8 MG/3ML SOPN Inject 2 mg into the skin once a week. 08/21/21  Yes Minette Brine, FNP  ?triamcinolone (KENALOG) 0.025 % ointment Apply 1 application topically 2 (two) times daily. 04/18/21  Yes Bary Castilla, NP  ?Vitamin D, Ergocalciferol, (DRISDOL) 1.25 MG (50000 UNIT) CAPS capsule Take 1 capsule by mouth on Tues and on Fridays. 04/23/21  Yes Ghumman, Ramandeep, NP  ?diclofenac (VOLTAREN) 50 MG EC tablet Take 1 tablet (50 mg total) by mouth 3 (three) times daily. 07/11/19   Woodroe Mode, MD  ?doxycycline (VIBRAMYCIN) 100 MG capsule Take 1 capsule (100 mg total) by mouth 2 (two) times daily. ?Patient not taking: Reported on 09/18/2021 08/25/21   Francene Finders, [name]  ?ferrous sulfate 325 (65 FE) MG EC tablet Take 1 tablet (325 mg total) by mouth 3 (three) times daily with meals. 10/31/19 06/04/21  Rodriguez-Southworth, Sunday Spillers, [name]  ?   ? ?Allergies    ?Other, Penicillins, Bactrim [sulfamethoxazole-trimethoprim], and Fish allergy   ? ?Review of Systems   ?Review of Systems  ?Constitutional:  Positive for diaphoresis and fever.  ?HENT:  Negative for congestion and rhinorrhea.   ?Respiratory:  Negative for shortness of breath.   ?Cardiovascular:  Negative for chest pain.  ?Gastrointestinal:  Positive for abdominal pain, constipation, diarrhea and nausea. Negative for vomiting.  ?Endocrine: Positive for polyuria.  ?Genitourinary:  Positive for dysuria and flank pain.  ?Musculoskeletal:  Positive for back pain.  ?All other systems reviewed and are negative. ? ?Physical Exam ?Updated Vital Signs ?BP (!) 136/101 (BP Location: Left Arm)   Pulse (!) 102   Resp 20   Ht '5\' 5"'  (1.651 m)   Wt (!) 154.7 kg   SpO2 99%   BMI 56.75 kg/m?  ?Physical Exam ?Vitals reviewed.  ?Constitutional:   ?   Appearance: She is well-developed.  ?HENT:  ?   Head: Normocephalic and atraumatic.  ?   Mouth/Throat:  ?   Mouth: Mucous membranes are moist.  ?Eyes:  ?   Extraocular Movements: Extraocular movements intact.   ?Cardiovascular:  ?   Rate and Rhythm: Normal rate.  ?   Heart sounds: No murmur heard. ?Pulmonary:  ?   Effort: Pulmonary effort is normal.  ?   Breath sounds: Normal breath sounds.  ?Abdominal:  ?   General: Bowel sounds are normal.  ?   Palpations: Abdomen is soft.  ?   Tenderness: There is generalized abdominal tenderness. There is right CVA tenderness and left CVA tenderness (Left worse than right).  ?Skin: ?   General: Skin is warm.  ?   Capillary Refill: Capillary refill takes less than 2 seconds.  ?Neurological:  ?   General: No focal deficit present.  ?   Mental Status: She is alert.  ?Psychiatric:     ?   Mood and Affect: Mood normal.  ? ? ?ED Results / Procedures / Treatments   ?Labs ?(all labs ordered are listed,  but only abnormal results are displayed) ?Labs Reviewed  ?URINALYSIS, ROUTINE W REFLEX MICROSCOPIC - Abnormal; Notable for the following components:  ?    Result Value  ? APPearance CLOUDY (*)   ? Hgb urine dipstick TRACE (*)   ? Bilirubin Urine SMALL (*)   ? Protein, ur >=300 (*)   ? All other components within normal limits  ?URINALYSIS, MICROSCOPIC (REFLEX) - Abnormal; Notable for the following components:  ? Bacteria, UA MANY (*)   ? All other components within normal limits  ?URINE CULTURE  ?CBC  ?BASIC METABOLIC PANEL  ? ? ?EKG ?None ? ?Radiology ?No results found. ? ?Procedures ?Procedures  ? ?Medications Ordered in ED ?Medications - No data to display ? ?ED Course/ Medical Decision Making/ A&P ?  ?                        ?Medical Decision Making ?Patient presenting with 3-day history of dysuria and back pain.  Also having fevers.  Differential includes UTI versus pyelonephritis versus appendicitis versus constipation.  Urinalysis showing trace hemoglobin, cloudy urine, small bilirubin, greater than 300 protein with many bacteria.  BMP significant for glucose of 168 but otherwise normal.  CBC within normal limits.  Physical exam showing considerable costovertebral angle tenderness worse  on the left than the right.  Most likely diagnosis is pyelonephritis.  We will treat empirically and culture urine.  Adjust treatment pending urine culture susceptibilities.  Patient discharged with 7-day prescription fo

## 2021-12-12 NOTE — ED Triage Notes (Signed)
Patient reports abdominal and back pain. Patient  states that it started on Tuesday. States that she was having hot spell. States that the pain starts in her back and radiate to her abdominal area.States that she has had constipation and diarrhea . Denies any dysuria.States that she does have some discomfort with urination. States that she had some nausea. ?

## 2021-12-12 NOTE — Discharge Instructions (Addendum)
It was wonderful taking care of you today.  You have a kidney infection and have sent a prescription for ciprofloxacin to your pharmacy.  You will take this twice daily for 7 days.  I also sent you a tablet of Diflucan for you to take after your antibiotics to treat your yeast infection.  Please be sure to complete your antibiotic regimen.  If your symptoms worsen, you have uncontrolled fevers or you have any other concerns please be evaluated by your PCP or come back to the emergency department for further evaluation.  I hope you have a wonderful day and have a safe trip! ? ? ?

## 2021-12-13 ENCOUNTER — Telehealth: Payer: Self-pay

## 2021-12-13 LAB — URINE CULTURE: Culture: NO GROWTH

## 2021-12-13 NOTE — Telephone Encounter (Signed)
Transition Care Management Follow-up Telephone Call ?Date of discharge and from where: 12/12/2021 [facility]  ?How have you been since you were released from the hospital? Pt reports her back is still in pain. She did not receive any pain medicine.  ?Any questions or concerns? No ? ?Items Reviewed: ?Did the pt receive and understand the discharge instructions provided? Yes  ?Medications obtained and verified? Yes  ?Other? Yes  ?Any new allergies since your discharge? No  ?Dietary orders reviewed? Yes ?Do you have support at home? Yes  ? ?Home Care and Equipment/Supplies: ?Were home health services ordered? no ?If so, what is the name of the agency? N/a  ?Has the agency set up a time to come to the patient's home? no ?Were any new equipment or medical supplies ordered?  No ?What is the name of the medical supply agency? N/a ?Were you able to get the supplies/equipment? no ?Do you have any questions related to the use of the equipment or supplies? No ? ?Functional Questionnaire: (I = Independent and D = Dependent) ?ADLs: i ? ?Bathing/Dressing- i ? ?Meal Prep- i ? ?Eating- i ? ?Maintaining continence- i ? ?Transferring/Ambulation- i ? ?Managing Meds- i ? ?Follow up appointments reviewed: ? ?PCP Hospital f/u appt confirmed? Yes  Scheduled to see n/a on n/a @ n/a. ?[facility] f/u appt confirmed? No  Scheduled to see n/a on n/a @ n/a. ?Are transportation arrangements needed? No  ?If their condition worsens, is the pt aware to call PCP or go to the Emergency Dept.? Yes ?Was the patient provided with contact information for the PCP's office or ED? Yes ?Was to pt encouraged to call back with questions or concerns? Yes  ?

## 2021-12-16 ENCOUNTER — Ambulatory Visit: Payer: Medicaid Other | Admitting: Podiatry

## 2021-12-16 DIAGNOSIS — M2141 Flat foot [pes planus] (acquired), right foot: Secondary | ICD-10-CM

## 2021-12-16 DIAGNOSIS — M21619 Bunion of unspecified foot: Secondary | ICD-10-CM | POA: Diagnosis not present

## 2021-12-16 DIAGNOSIS — M2142 Flat foot [pes planus] (acquired), left foot: Secondary | ICD-10-CM | POA: Diagnosis not present

## 2021-12-16 NOTE — Progress Notes (Signed)
No show

## 2021-12-17 ENCOUNTER — Ambulatory Visit: Payer: Medicaid Other | Admitting: Internal Medicine

## 2021-12-17 ENCOUNTER — Encounter: Payer: Self-pay | Admitting: Internal Medicine

## 2021-12-17 NOTE — Progress Notes (Deleted)
Cardiology Office Note:    Date:  12/17/2021   ID:  [name], DOB 02-Jun-1981, MRN [id number]  PCP:  Minette Brine, FNP   [facility] Providers Cardiologist:  Janina Mayo, MD     Referring MD: Minette Brine, FNP   No chief complaint on file. Abnormal EKG  History of Present Illness:    [name] is a 41 y.o. 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 She notes some anxiety. She is worried about that. She felt an ache around her heart during  a very stressful period.. The episodes had minimal aching briefly. No nausea. No LH, dizziness, no syncope. This was in July 2022. 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 She denies recurrent chest pain. No dyspnea no exertion. No pitting edema. She is planned for a sleep study. Her lipid levels are normal. She does not smoke. Her family history includes her father died of an MI and he had ESRD. Her mother had heart valve surgery. She passed away. Siblings are healthy. She has twins and  another child 11 months apart. She smoked cigarettes for years. She then stopped recently smoking. No etoh use. No drug use. She's a pediatric nurse.  Interim Hx: She notes typically normal heart rates. She notes had some nervousness at the time of monitoring her heart rate in clinic with her PCP. Her rates were up 118 bpm. She notes that she did not take her propanolol that day. She has not had light headedness, dizziness or syncope.  Interim Hx 12/17/2021 Went to the ED for abdominal and back pain. C/f UTI or pyelo discharged on ciproflaxacin.  Past Medical History:  Diagnosis Date   Abnormal Pap smear    f/u wnl   Asthma    Fibroid    Gestational diabetes    Hypertension    Morbid obesity ([place])    Sleep apnea    Urinary  tract infection     Past Surgical History:  Procedure Laterality Date   CESAREAN SECTION     DILATION AND CURETTAGE OF UTERUS     TUBAL LIGATION      Current Medications: No outpatient medications have been marked as taking for the 12/17/21 encounter (Appointment) with Janina Mayo, MD.     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    Comment: BTL  Other Topics Concern   Not on file  Social History Narrative   Currently [redacted]  weeks pregnant with first child (boy) Due 04/16/11         Social Determinants of Health   Financial Resource Strain: Not on file  Food Insecurity: Not on file  Transportation Needs: Not on file  Physical Activity: Not on file  Stress: Not on file  Social Connections: Not on file     Family History: The patient's family history includes Allergies in her mother; Asthma in her mother; Diabetes in her father, maternal grandmother, and mother; Heart disease in her maternal grandmother; Hypertension in her mother. There is no history of Anesthesia problems, Hypotension, Malignant hyperthermia, Pseudochol deficiency, or Hearing loss.  ROS:   Please see the history of present illness.     All other systems reviewed and are negative.  EKGs/Labs/Other Studies  Reviewed:    The following studies were reviewed today:   EKG:  EKG is  ordered today.  The ekg ordered today demonstrates   NSR, LAE  Recent Labs: 08/21/2021: ALT 14 12/12/2021: BUN 14; Creatinine, Ser 0.65; Hemoglobin 13.8; Platelets 261; Potassium 4.2; Sodium 139  Recent Lipid Panel    Component Value Date/Time   CHOL 172 08/21/2021 1236   TRIG 176 (H) 08/21/2021 1236   HDL 35 (L) 08/21/2021 1236   CHOLHDL 4.9 (H) 08/21/2021 1236   LDLCALC 106 (H) 08/21/2021 1236     Risk Assessment/Calculations:           Physical Exam:    VS:   There were no vitals filed for this visit.     Wt Readings from Last 3 Encounters:  12/12/21 (!) 341 lb (154.7 kg)  09/18/21 (!) 337 lb 12.8 oz (153.2 kg)  08/21/21 (!) 335 lb (152 kg)     GEN:  Well nourished, well developed in no acute distress HEENT: Normal NECK: No JVD; No carotid bruits LYMPHATICS: No lymphadenopathy CARDIIAC: RRR, no murmurs, rubs, gallops RESPIRATORY:  Clear to auscultation without rales, wheezing or rhonchi  ABDOMEN: Soft, non-tender, non-distended MUSCULOSKELETAL:  No edema; No deformity  SKIN: Warm and dry NEUROLOGIC:  Alert and oriented x 3 PSYCHIATRIC:  Normal affect   ASSESSMENT:    #Inappropriate Sinus Tachycardia: Her EKG on initial visit was benign. She has  no symptoms of CAD or heart failure.  Tachycardia possibly related to anxiety; can continue to monitor. She has no symptoms. She does not have high risk features including syncope c/f arrhythmia , family hx of SCD. She notes recurrent tachycardia with appointments but normal rates at home. Will plan for her to continue her propanlol. Can titrate if rates persistently > 100 bpm. If her rates increase at home, she develops LH/dizziness or syncope will do cardiac event monitor.   PLAN:    In order of problems listed above:  Follow up one year ***     Medication Adjustments/Labs and Tests Ordered: Current medicines are reviewed at length  with the patient today.  Concerns regarding medicines are outlined above.   Signed, Janina Mayo, MD  12/17/2021 11:47 AM    [facility]

## 2021-12-17 NOTE — Progress Notes (Signed)
Subjective: ?41 year old female presents the office today for follow-up evaluation of a painful callus to her big toe joint, medial aspect.  She does try to trim them herself with her causing discomfort.  No swelling erythema drainage.  No open lesions. ? ?Last A1c was 8.2 on August 21, 2021 ? ?Objective: ?AAO x3, NAD ?DP/PT pulses palpable bilaterally, CRT less than 3 seconds ?Hyperkeratotic lesion medial hallux and there is some dried blood present however there is no underlying ulceration drainage or any signs of infection noted today.  Minimal hyperkeratotic tissue on the right medial nail border.  No open lesions identified today. ?Pronation is present resulting likely the hyperkeratotic lesions. ?No pain with calf compression, swelling, warmth, erythema ? ? ?Assessment: ?Preulcerative skin lesion ? ?Plan: ?-All treatment options discussed with the patient including all alternatives, risks, complications.  ?-Debrided hyperkeratotic lesion with any complications or bleeding as a courtesy but we can discuss measures to prevent reoccurrence including surgery as well as good arch support. ?-Monitoring signs or symptoms of infection.  Discussed daily foot inspection continue to encourage glucose control. ? ?Trula Slade DPM ?

## 2022-02-10 DIAGNOSIS — H16223 Keratoconjunctivitis sicca, not specified as Sjogren's, bilateral: Secondary | ICD-10-CM | POA: Diagnosis not present

## 2022-02-10 DIAGNOSIS — H2513 Age-related nuclear cataract, bilateral: Secondary | ICD-10-CM | POA: Diagnosis not present

## 2022-02-10 DIAGNOSIS — E119 Type 2 diabetes mellitus without complications: Secondary | ICD-10-CM | POA: Diagnosis not present

## 2022-02-10 DIAGNOSIS — H10413 Chronic giant papillary conjunctivitis, bilateral: Secondary | ICD-10-CM | POA: Diagnosis not present

## 2022-02-11 ENCOUNTER — Ambulatory Visit: Payer: Medicaid Other | Admitting: Podiatry

## 2022-02-20 DIAGNOSIS — E119 Type 2 diabetes mellitus without complications: Secondary | ICD-10-CM | POA: Diagnosis not present

## 2022-02-28 ENCOUNTER — Other Ambulatory Visit: Payer: Self-pay | Admitting: Nurse Practitioner

## 2022-02-28 DIAGNOSIS — E1165 Type 2 diabetes mellitus with hyperglycemia: Secondary | ICD-10-CM

## 2022-03-03 ENCOUNTER — Other Ambulatory Visit: Payer: Self-pay

## 2022-03-03 DIAGNOSIS — R519 Headache, unspecified: Secondary | ICD-10-CM

## 2022-03-03 DIAGNOSIS — F419 Anxiety disorder, unspecified: Secondary | ICD-10-CM

## 2022-03-03 DIAGNOSIS — I10 Essential (primary) hypertension: Secondary | ICD-10-CM

## 2022-03-03 DIAGNOSIS — E1165 Type 2 diabetes mellitus with hyperglycemia: Secondary | ICD-10-CM

## 2022-03-03 MED ORDER — PROPRANOLOL HCL 10 MG PO TABS: ORAL_TABLET | ORAL | 0 refills | Status: DC

## 2022-03-04 ENCOUNTER — Other Ambulatory Visit: Payer: Self-pay

## 2022-03-04 ENCOUNTER — Encounter (HOSPITAL_BASED_OUTPATIENT_CLINIC_OR_DEPARTMENT_OTHER): Payer: Self-pay

## 2022-03-04 ENCOUNTER — Emergency Department (HOSPITAL_BASED_OUTPATIENT_CLINIC_OR_DEPARTMENT_OTHER): Payer: Medicaid Other

## 2022-03-04 ENCOUNTER — Emergency Department (HOSPITAL_BASED_OUTPATIENT_CLINIC_OR_DEPARTMENT_OTHER)
Admission: EM | Admit: 2022-03-04 | Discharge: 2022-03-04 | Disposition: A | Discharge status: Home / Self Care | Payer: Medicaid Other | Attending: Emergency Medicine | Admitting: Emergency Medicine

## 2022-03-04 DIAGNOSIS — E119 Type 2 diabetes mellitus without complications: Secondary | ICD-10-CM | POA: Diagnosis not present

## 2022-03-04 DIAGNOSIS — Z794 Long term (current) use of insulin: Secondary | ICD-10-CM | POA: Diagnosis not present

## 2022-03-04 DIAGNOSIS — Z87891 Personal history of nicotine dependence: Secondary | ICD-10-CM | POA: Diagnosis not present

## 2022-03-04 DIAGNOSIS — Z7984 Long term (current) use of oral hypoglycemic drugs: Secondary | ICD-10-CM | POA: Insufficient documentation

## 2022-03-04 DIAGNOSIS — M545 Low back pain, unspecified: Secondary | ICD-10-CM

## 2022-03-04 DIAGNOSIS — R109 Unspecified abdominal pain: Secondary | ICD-10-CM | POA: Insufficient documentation

## 2022-03-04 DIAGNOSIS — Z79899 Other long term (current) drug therapy: Secondary | ICD-10-CM | POA: Diagnosis not present

## 2022-03-04 DIAGNOSIS — N858 Other specified noninflammatory disorders of uterus: Secondary | ICD-10-CM | POA: Diagnosis not present

## 2022-03-04 DIAGNOSIS — I10 Essential (primary) hypertension: Secondary | ICD-10-CM | POA: Insufficient documentation

## 2022-03-04 DIAGNOSIS — K449 Diaphragmatic hernia without obstruction or gangrene: Secondary | ICD-10-CM | POA: Diagnosis not present

## 2022-03-04 DIAGNOSIS — M5459 Other low back pain: Secondary | ICD-10-CM | POA: Diagnosis not present

## 2022-03-04 DIAGNOSIS — K429 Umbilical hernia without obstruction or gangrene: Secondary | ICD-10-CM | POA: Diagnosis not present

## 2022-03-04 DIAGNOSIS — D259 Leiomyoma of uterus, unspecified: Secondary | ICD-10-CM | POA: Diagnosis not present

## 2022-03-04 LAB — URINALYSIS, MICROSCOPIC (REFLEX)

## 2022-03-04 LAB — URINALYSIS, ROUTINE W REFLEX MICROSCOPIC
Bilirubin Urine: NEGATIVE
Glucose, UA: >=500 mg/dL — AB
Hgb urine dipstick: NEGATIVE
Ketones, ur: NEGATIVE mg/dL
Leukocytes,Ua: NEGATIVE
Nitrite: NEGATIVE
Protein, ur: NEGATIVE mg/dL
Specific Gravity, Urine: >=1.03 (ref 1.005–1.030)
pH: 7 (ref 5.0–8.0)

## 2022-03-04 LAB — PREGNANCY, URINE: Preg Test, Ur: NEGATIVE

## 2022-03-04 MED ORDER — HYDROCODONE-ACETAMINOPHEN 5-325 MG PO TABS: 1.0000 | ORAL_TABLET | Freq: Four times a day (QID) | ORAL | 0 refills | Status: DC | PRN

## 2022-03-04 MED ORDER — HYDROCODONE-ACETAMINOPHEN 5-325 MG PO TABS
1.0000 | ORAL_TABLET | Freq: Once | ORAL | Status: AC
  Administered 2022-03-04: 1 via ORAL
  Filled 2022-03-04: qty 1

## 2022-03-04 MED ORDER — CYCLOBENZAPRINE HCL 10 MG PO TABS: 10.0000 mg | ORAL_TABLET | Freq: Three times a day (TID) | ORAL | 0 refills | Status: DC | PRN

## 2022-03-04 NOTE — ED Triage Notes (Signed)
Right flank pain intermittently x 2 weeks. States was on her knees cleaning today when the pain worsened. Worse with movement.   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

## 2022-03-04 NOTE — ED Notes (Signed)
D/c paperwork reviewed with pt, incl prescriptions and f/u care. All questions addressed prior to d/c. Pt provided wheelchair and pushed to ED exit by NT, per her request.

## 2022-03-04 NOTE — ED Provider Notes (Signed)
[facility] EMERGENCY DEPARTMENT Provider Note   CSN: [id number] Arrival date & time: 03/04/22  1654     History  Chief Complaint  Patient presents with   Flank Pain    [name] is a 41 y.o. female.  Patient with complaint of intermittent right back pain right flank pain now radiating over to the left side.  Pain is made worse with movements.  Today it was significantly worse.  Took a nonsteroidal without much help.  No fall or injury.  Patient concerned about urinary tract infection.  But symptoms are made worse with movements.  Does not radiate into the buttocks area no radiation into the lower extremities no numbness or weakness to the feet.  Denies hematuria denies fevers.  Past medical history significant for diabetes, obesity, frequent urinary tract infections, hypertension past surgical history significant for tubal ligation.  Patient is a former smoker quit in 2021.       Home Medications Prior to Admission medications   Medication Sig Start Date End Date Taking? Authorizing Provider  cyclobenzaprine (FLEXERIL) 10 MG tablet Take 1 tablet (10 mg total) by mouth 3 (three) times daily as needed for muscle spasms. 03/04/22  Yes Fredia Sorrow, MD  HYDROcodone-acetaminophen (NORCO/VICODIN) 5-325 MG tablet Take 1 tablet by mouth every 6 (six) hours as needed for moderate pain. 03/04/22  Yes Fredia Sorrow, MD  albuterol (PROVENTIL) (5 MG/ML) 0.5% nebulizer solution Take 0.5 mLs (2.5 mg total) by nebulization every 6 (six) hours as needed for wheezing or shortness of breath. 04/15/19   Zigmund Gottron, NP  albuterol (VENTOLIN HFA) 108 (90 Base) MCG/ACT inhaler Inhale 1-2 puffs into the lungs every 6 (six) hours as needed for wheezing or shortness of breath. 07/23/20   Wieters, Hallie C, [name]  atorvastatin (LIPITOR) 20 MG tablet TAKE 1 TABLET(20 MG) BY MOUTH DAILY 04/26/21   Minette Brine, FNP  benzonatate (TESSALON) 100 MG capsule Take 1 capsule (100 mg total) by mouth  every 8 (eight) hours as needed for cough. 08/08/21   Teodora Medici, FNP  blood glucose meter kit and supplies KIT Dispense based on patient and insurance preference. Use up to four times daily as directed. (FOR ICD-9 250.00, 250.01). 07/23/20   Wieters, Hallie C, [name]  buPROPion (WELLBUTRIN XL) 300 MG 24 hr tablet TAKE 1 TABLET(300 MG) BY MOUTH DAILY 04/18/21   Ghumman, Ramandeep, NP  busPIRone (BUSPAR) 10 MG tablet Take 1 tablet (10 mg total) by mouth daily. 08/21/21   Minette Brine, FNP  cetirizine (ZYRTEC ALLERGY) 10 MG tablet Take 1 tablet (10 mg total) by mouth daily. 04/26/21   Bary Castilla, NP  dapagliflozin propanediol (FARXIGA) 10 MG TABS tablet Take 1 tablet (10 mg total) by mouth daily. 10/02/21   Minette Brine, FNP  diclofenac (VOLTAREN) 50 MG EC tablet Take 1 tablet (50 mg total) by mouth 3 (three) times daily. 07/11/19   Woodroe Mode, MD  doxycycline (VIBRAMYCIN) 100 MG capsule Take 1 capsule (100 mg total) by mouth 2 (two) times daily. Patient not taking: Reported on 09/18/2021 08/25/21   Francene Finders, [name]  ferrous sulfate 325 (65 FE) MG EC tablet Take 1 tablet (325 mg total) by mouth 3 (three) times daily with meals. 10/31/19 06/04/21  Rodriguez-Southworth, Sunday Spillers, [name]  ferrous sulfate 325 (65 FE) MG EC tablet Take 1 tablet (325 mg total) by mouth 2 (two) times daily. 10/10/21 10/10/22  Minette Brine, FNP  fluconazole (DIFLUCAN) 150 MG tablet Take 1 tablet (150  mg total) by mouth daily. 12/12/21   Cresenzo, Angelyn Punt, MD  fluticasone (FLONASE) 50 MCG/ACT nasal spray Place 1-2 sprays into both nostrils daily. 04/18/21   Ghumman, Ramandeep, NP  glucose blood test strip USE TO CHECK BLOOD SUGARS UP TO 4 TIMES DAILY  DX CODE:E11.9 05/05/19   Rodriguez-Southworth, Sunday Spillers, [name]  ibuprofen (ADVIL) 800 MG tablet Take 800 mg by mouth 3 (three) times daily. 07/11/20   [provider]  insulin degludec (TRESIBA FLEXTOUCH) 100 UNIT/ML FlexTouch Pen Inject 45 Units into the skin daily. 04/18/21    Ghumman, Ramandeep, NP  Insulin Pen Needle 31G X 8 MM MISC 1 Device by Does not apply route daily. 11/30/20   Shamleffer, Melanie Crazier, MD  Lancets (ACCU-CHEK MULTICLIX) lancets USE TO CHECK BLOOD SUGARS UP TO 4 TIMES DAILY  DX CODE:E11.9 05/05/19   Rodriguez-Southworth, Sunday Spillers, [name]  levonorgestrel (MIRENA) 20 MCG/24HR IUD 1 each by Intrauterine route once.    [provider]  losartan-hydrochlorothiazide (HYZAAR) 50-12.5 MG tablet TAKE 1 TABLET BY MOUTH DAILY 12/02/21 03/02/22  Minette Brine, FNP  metFORMIN (GLUCOPHAGE-XR) 750 MG 24 hr tablet TAKE 1 TABLET(750 MG) BY MOUTH IN THE MORNING AND AT BEDTIME 09/09/21   Minette Brine, FNP  metroNIDAZOLE (FLAGYL) 500 MG tablet Take 1 tablet (500 mg total) by mouth 2 (two) times daily. 05/15/21   Fondaw, Kathleene Hazel, PA  OZEMPIC, 2 MG/DOSE, 8 MG/3ML SOPN INJECT UNDER THE SKIN ONCE A WEEK 02/28/22   Minette Brine, FNP  propranolol (INDERAL) 10 MG tablet TAKE 1 TABLET(10 MG) BY MOUTH EVERY EVENING. No refills until pt is seen. 03/03/22   Minette Brine, FNP  triamcinolone (KENALOG) 0.025 % ointment Apply 1 application topically 2 (two) times daily. 04/18/21   Bary Castilla, NP  Vitamin D, Ergocalciferol, (DRISDOL) 1.25 MG (50000 UNIT) CAPS capsule Take 1 capsule by mouth on Tues and on Fridays. 04/23/21   Bary Castilla, NP      Allergies    Other, Penicillins, Bactrim [sulfamethoxazole-trimethoprim], and Fish allergy    Review of Systems   Review of Systems  Constitutional:  Negative for chills and fever.  HENT:  Negative for ear pain and sore throat.   Eyes:  Negative for pain and visual disturbance.  Respiratory:  Negative for cough and shortness of breath.   Cardiovascular:  Negative for chest pain and palpitations.  Gastrointestinal:  Negative for abdominal pain and vomiting.  Genitourinary:  Positive for flank pain. Negative for dysuria and hematuria.  Musculoskeletal:  Positive for back pain. Negative for arthralgias.  Skin:  Negative  for color change and rash.  Neurological:  Negative for seizures, syncope, weakness and numbness.  All other systems reviewed and are negative.   Physical Exam Updated Vital Signs BP 119/85 (BP Location: Left Arm)   Pulse (!) 104   Temp 98.5 F (36.9 C) (Oral)   Resp 20   Wt (!) 154 kg   SpO2 99%   BMI 56.50 kg/m  Physical Exam Vitals and nursing note reviewed.  Constitutional:      General: She is not in acute distress.    Appearance: She is well-developed. She is obese.  HENT:     Head: Normocephalic and atraumatic.  Eyes:     Extraocular Movements: Extraocular movements intact.     Conjunctiva/sclera: Conjunctivae normal.     Pupils: Pupils are equal, round, and reactive to light.  Cardiovascular:     Rate and Rhythm: Normal rate and regular rhythm.     Heart sounds:  No murmur heard. Pulmonary:     Effort: Pulmonary effort is normal. No respiratory distress.     Breath sounds: Normal breath sounds.  Abdominal:     Palpations: Abdomen is soft.     Tenderness: There is no abdominal tenderness.  Musculoskeletal:        General: No swelling.     Cervical back: Normal range of motion and neck supple.     Comments: Tenderness to palpation right lumbar area.  Skin:    General: Skin is warm and dry.     Capillary Refill: Capillary refill takes less than 2 seconds.  Neurological:     General: No focal deficit present.     Mental Status: She is alert and oriented to person, place, and time.     Cranial Nerves: No cranial nerve deficit.     Sensory: No sensory deficit.     Motor: No weakness.  Psychiatric:        Mood and Affect: Mood normal.     ED Results / Procedures / Treatments   Labs (all labs ordered are listed, but only abnormal results are displayed) Labs Reviewed  URINALYSIS, ROUTINE W REFLEX MICROSCOPIC - Abnormal; Notable for the following components:      Result Value   APPearance HAZY (*)    Glucose, UA >=500 (*)    All other components within  normal limits  URINALYSIS, MICROSCOPIC (REFLEX) - Abnormal; Notable for the following components:   Bacteria, UA RARE (*)    All other components within normal limits  PREGNANCY, URINE    EKG None  Radiology CT Renal Stone Study  Result Date: 03/04/2022 CLINICAL DATA:  Right flank pain for 2 weeks. Kidney stones suspected. EXAM: CT ABDOMEN AND PELVIS WITHOUT CONTRAST TECHNIQUE: Multidetector CT imaging of the abdomen and pelvis was performed following the standard protocol without IV contrast. RADIATION DOSE REDUCTION: This exam was performed according to the departmental dose-optimization program which includes automated exposure control, adjustment of the mA and/or kV according to patient size and/or use of iterative reconstruction technique. COMPARISON:  05/15/2021 FINDINGS: Lower chest: Lung bases are clear.  Small esophageal hiatal hernia. Hepatobiliary: No focal liver abnormality is seen. No gallstones, gallbladder wall thickening, or biliary dilatation. Pancreas: Unremarkable. No pancreatic ductal dilatation or surrounding inflammatory changes. Spleen: Normal in size without focal abnormality. Adrenals/Urinary Tract: Adrenal glands are unremarkable. Kidneys are normal, without renal calculi, focal lesion, or hydronephrosis. Bladder is unremarkable. Stomach/Bowel: Stomach is within normal limits. Appendix appears normal. No evidence of bowel wall thickening, distention, or inflammatory changes. Vascular/Lymphatic: No significant vascular findings are present. No enlarged abdominal or pelvic lymph nodes. Reproductive: 2.1 cm diameter peripherally calcified nodule in the left uterine fundus likely representing a pedunculated fibroid. No change since prior study. An intrauterine device is present. No abnormal adnexal masses. Other: No free air or free fluid in the abdomen. Moderate-sized periumbilical hernia containing fat. Musculoskeletal: No acute or significant osseous findings. IMPRESSION: 1. No  renal or ureteral stone or obstruction. 2. No evidence of bowel obstruction or inflammation. Appendix is normal. 3. Intrauterine device is present. Calcified fibroid on the uterus without change. 4. Moderate periumbilical hernia containing fat.  No progression. Electronically Signed   By: Lucienne Capers M.D.   On: 03/04/2022 18:08    Procedures Procedures    Medications Ordered in ED Medications - No data to display  ED Course/ Medical Decision Making/ A&P  Medical Decision Making Amount and/or Complexity of Data Reviewed Labs: ordered. Radiology: ordered.   Suspect musculoskeletal back pain no fall or injury.  But has been intermittent.  Will check urinalysis urine pregnancy test and will get CT renal which will rule out any kidney stone.  Urinalysis rule out urinary tract infection.  The CT renal will also get a look at her lumbar spine area.  If musculoskeletal we will treat with pain medicine.  And work note to rest the back and follow-up with her doctors or sports medicine.  Urinalysis without evidence of urinary tract infection.  Pregnancy test negative.  CT renal without any acute findings.  Suspect this is musculoskeletal.  Will treat with pain medication work note.  Will give first dose of pain medication here.  Final Clinical Impression(s) / ED Diagnoses Final diagnoses:  Acute right-sided low back pain without sciatica    Rx / DC Orders ED Discharge Orders          Ordered    cyclobenzaprine (FLEXERIL) 10 MG tablet  3 times daily PRN        03/04/22 1822    HYDROcodone-acetaminophen (NORCO/VICODIN) 5-325 MG tablet  Every 6 hours PRN        03/04/22 Nicholaus Bloom, MD 03/04/22 Vernelle Emerald

## 2022-03-04 NOTE — Discharge Instructions (Addendum)
Work note provided for rest for the next 3 days.  Try to rest the back is much as possible.  Take the pain medication as directed.  Take the Flexeril as directed.  Make an appointment follow-up with your doctor.  Work-up showed no signs of urinary tract infection CT scan showed no abnormalities inside the abdomen of any significance and no musculoskeletal abnormalities.

## 2022-03-07 ENCOUNTER — Telehealth: Payer: Self-pay

## 2022-03-07 NOTE — Telephone Encounter (Signed)
Transition Care Management Follow-up Telephone Call Date of discharge and from where: 03/04/2022 [facility]   How have you been since you were released from the hospital? Pt states she is still in pain.  Any questions or concerns? No  Items Reviewed: Did the pt receive and understand the discharge instructions provided? Yes  Medications obtained and verified? Yes  Other? Yes  Any new allergies since your discharge? No  Dietary orders reviewed? Yes Do you have support at home? Yes   Home Care and Equipment/Supplies: Were home health services ordered? no If so, what is the name of the agency? N/a  Has the agency set up a time to come to the patient's home? no Were any new equipment or medical supplies ordered?  No What is the name of the medical supply agency? N/a Were you able to get the supplies/equipment? no Do you have any questions related to the use of the equipment or supplies? No  Functional Questionnaire: (I = Independent and D = Dependent) ADLs: i  Bathing/Dressing- i  Meal Prep- i  Eating- i  Maintaining continence- i  Transferring/Ambulation- i  Managing Meds- i  Follow up appointments reviewed:  PCP Hospital f/u appt confirmed? yes Scheduled to see Minette Brine on Monday 03/10/2022 @ [facility]. [facility] f/u appt confirmed? No  Scheduled to see n/a on n/a @ n/a. Are transportation arrangements needed? No  If their condition worsens, is the pt aware to call PCP or go to the Emergency Dept.? Yes Was the patient provided with contact information for the PCP's office or ED? Yes Was to pt encouraged to call back with questions or concerns? Yes

## 2022-03-10 ENCOUNTER — Ambulatory Visit (INDEPENDENT_AMBULATORY_CARE_PROVIDER_SITE_OTHER): Payer: Medicaid Other | Admitting: Nurse Practitioner

## 2022-03-10 ENCOUNTER — Encounter: Payer: Self-pay | Admitting: Nurse Practitioner

## 2022-03-10 VITALS — BP 122/78 | HR 112 | Temp 98.5°F | Ht 65.0 in | Wt 313.0 lb

## 2022-03-10 DIAGNOSIS — Z6841 Body Mass Index (BMI) 40.0 and over, adult: Secondary | ICD-10-CM

## 2022-03-10 DIAGNOSIS — E782 Mixed hyperlipidemia: Secondary | ICD-10-CM

## 2022-03-10 DIAGNOSIS — I10 Essential (primary) hypertension: Secondary | ICD-10-CM | POA: Diagnosis not present

## 2022-03-10 DIAGNOSIS — E1165 Type 2 diabetes mellitus with hyperglycemia: Secondary | ICD-10-CM

## 2022-03-10 LAB — LIPID PANEL
Chol/HDL Ratio: 4.8 ratio — ABNORMAL HIGH (ref 0.0–4.4)
Cholesterol, Total: 191 mg/dL (ref 100–199)
HDL: 40 mg/dL (ref >39)
LDL Chol Calc (NIH): 130 mg/dL — ABNORMAL HIGH (ref 0–99)
Triglycerides: 115 mg/dL (ref 0–149)
VLDL Cholesterol Cal: 21 mg/dL (ref 5–40)

## 2022-03-10 LAB — BMP8+EGFR
BUN/Creatinine Ratio: 14 (ref 9–23)
BUN: 11 mg/dL (ref 6–24)
CO2: 20 mmol/L (ref 20–29)
Calcium: 10.2 mg/dL (ref 8.7–10.2)
Chloride: 100 mmol/L (ref 96–106)
Creatinine, Ser: 0.79 mg/dL (ref 0.57–1.00)
Glucose: 78 mg/dL (ref 70–99)
Potassium: 4.2 mmol/L (ref 3.5–5.2)
Sodium: 138 mmol/L (ref 134–144)
eGFR: 97 mL/min/{1.73_m2} (ref >59)

## 2022-03-10 LAB — HEMOGLOBIN A1C
Est. average glucose Bld gHb Est-mCnc: 151 mg/dL
Hgb A1c MFr Bld: 6.9 % — ABNORMAL HIGH (ref 4.8–5.6)

## 2022-03-10 MED ORDER — OZEMPIC (2 MG/DOSE) 8 MG/3ML ~~LOC~~ SOPN: PEN_INJECTOR | SUBCUTANEOUS | 0 refills | Status: DC

## 2022-03-10 NOTE — Patient Instructions (Signed)

## 2022-03-10 NOTE — Progress Notes (Signed)
I,Tianna Badgett,acting as a Education administrator for [employer], FNP.,have documented all relevant documentation on the behalf of Stephanie Brine, FNP,as directed by  Stephanie Brine, FNP while in the presence of [name], [place].  Subjective:     Patient ID: [name] , female    DOB: [birth year] , 41 y.o.   MRN: [id number]   Chief Complaint  Patient presents with   Follow-up    HPI  Patient presents today for ED follow up on 03/04/22. She had a muscle spasm after cleaning out the bathtub. She had taken diclofenac without relief. She was treated with muscle relaxer and vicodin for the pain which has helped. She is also using heating and ice pad. She is working in [employer] as a Marine scientist. She is now wearing [employer] for her shoes and were fitted. She also has an insert to the shoes to help take the pressure off her feet on the side.   Her next appt with Endocrinology in October due to her provider retiring. She is on the cancellation list.  She reports she has not seen endocrinology in more than one year. She reports she has had to work and unable to make her appts.   Wt Readings from Last 3 Encounters: 03/10/22 : (!) 313 lb (142 kg) 03/04/22 : (!) 339 lb 8.1 oz (154 kg) 12/12/21 : (!) 341 lb (154.7 kg)       Past Medical History:  Diagnosis Date   Abnormal Pap smear    f/u wnl   Asthma    Fibroid    Gestational diabetes    Hypertension    Morbid obesity ([place])    Sleep apnea    Urinary tract infection      Family History  Problem Relation Age of Onset   Diabetes Father    Allergies Mother    Asthma Mother    Hypertension Mother    Diabetes Mother    Diabetes Maternal Grandmother    Heart disease Maternal Grandmother    Anesthesia problems Neg Hx    Hypotension Neg Hx    Malignant hyperthermia Neg Hx    Pseudochol deficiency Neg Hx    Hearing loss Neg Hx      Current Outpatient Medications:    albuterol (PROVENTIL) (5 MG/ML) 0.5% nebulizer solution, Take 0.5 mLs (2.5 mg  total) by nebulization every 6 (six) hours as needed for wheezing or shortness of breath., Disp: 20 mL, Rfl: 12   albuterol (VENTOLIN HFA) 108 (90 Base) MCG/ACT inhaler, Inhale 1-2 puffs into the lungs every 6 (six) hours as needed for wheezing or shortness of breath., Disp: 18 g, Rfl: 0   atorvastatin (LIPITOR) 20 MG tablet, TAKE 1 TABLET(20 MG) BY MOUTH DAILY, Disp: 90 tablet, Rfl: 1   blood glucose meter kit and supplies KIT, Dispense based on patient and insurance preference. Use up to four times daily as directed. (FOR ICD-9 250.00, 250.01)., Disp: 1 each, Rfl: 0   cetirizine (ZYRTEC ALLERGY) 10 MG tablet, Take 1 tablet (10 mg total) by mouth daily., Disp: 90 tablet, Rfl: 2   cyclobenzaprine (FLEXERIL) 10 MG tablet, Take 1 tablet (10 mg total) by mouth 3 (three) times daily as needed for muscle spasms., Disp: 20 tablet, Rfl: 0   dapagliflozin propanediol (FARXIGA) 10 MG TABS tablet, Take 1 tablet (10 mg total) by mouth daily., Disp: 90 tablet, Rfl: 1   diclofenac (VOLTAREN) 50 MG EC tablet, Take 1 tablet (50 mg total) by mouth 3 (three) times daily.,  Disp: 45 tablet, Rfl: 3   ferrous sulfate 325 (65 FE) MG EC tablet, Take 1 tablet (325 mg total) by mouth 3 (three) times daily with meals., Disp: 90 tablet, Rfl: 2   ferrous sulfate 325 (65 FE) MG EC tablet, Take 1 tablet (325 mg total) by mouth 2 (two) times daily., Disp: 60 tablet, Rfl: 3   fluticasone (FLONASE) 50 MCG/ACT nasal spray, Place 1-2 sprays into both nostrils daily., Disp: 16 g, Rfl: 0   glucose blood test strip, USE TO CHECK BLOOD SUGARS UP TO 4 TIMES DAILY  DX CODE:E11.9, Disp: 100 each, Rfl: 12   HYDROcodone-acetaminophen (NORCO/VICODIN) 5-325 MG tablet, Take 1 tablet by mouth every 6 (six) hours as needed for moderate pain., Disp: 14 tablet, Rfl: 0   ibuprofen (ADVIL) 800 MG tablet, Take 800 mg by mouth 3 (three) times daily., Disp: , Rfl:    insulin degludec (TRESIBA FLEXTOUCH) 100 UNIT/ML FlexTouch Pen, Inject 45 Units into the  skin daily., Disp: 45 mL, Rfl: 2   Insulin Pen Needle 31G X 8 MM MISC, 1 Device by Does not apply route daily., Disp: 100 each, Rfl: 1   Lancets (ACCU-CHEK MULTICLIX) lancets, USE TO CHECK BLOOD SUGARS UP TO 4 TIMES DAILY  DX CODE:E11.9, Disp: 100 each, Rfl: 12   levonorgestrel (MIRENA) 20 MCG/24HR IUD, 1 each by Intrauterine route once., Disp: , Rfl:    losartan-hydrochlorothiazide (HYZAAR) 50-12.5 MG tablet, TAKE 1 TABLET BY MOUTH DAILY, Disp: 90 tablet, Rfl: 1   metFORMIN (GLUCOPHAGE-XR) 750 MG 24 hr tablet, TAKE 1 TABLET(750 MG) BY MOUTH IN THE MORNING AND AT BEDTIME, Disp: 180 tablet, Rfl: 1   propranolol (INDERAL) 10 MG tablet, TAKE 1 TABLET(10 MG) BY MOUTH EVERY EVENING. No refills until pt is seen., Disp: 30 tablet, Rfl: 0   triamcinolone (KENALOG) 0.025 % ointment, Apply 1 application topically 2 (two) times daily., Disp: 30 g, Rfl: 0   Vitamin D, Ergocalciferol, (DRISDOL) 1.25 MG (50000 UNIT) CAPS capsule, Take 1 capsule by mouth on Tues and on Fridays., Disp: 24 capsule, Rfl: 0   Semaglutide, 2 MG/DOSE, (OZEMPIC, 2 MG/DOSE,) 8 MG/3ML SOPN, INJECT UNDER THE SKIN ONCE A WEEK, Disp: 9 mL, Rfl: 0   Allergies  Allergen Reactions   Other Anaphylaxis and Other (See Comments)    Pt has fish allergy Pt has allergy to butter cookies   Penicillins Rash and Other (See Comments)    Told MD that PCN reaction is rash and that she has taken amoxicillin before   Bactrim [Sulfamethoxazole-Trimethoprim]     Diarrhea, abdominal cramps and rash   Fish Allergy     Other reaction(s): Unknown     Review of Systems  Constitutional: Negative.   Respiratory: Negative.    Cardiovascular: Negative.   Gastrointestinal: Negative.   Neurological: Negative.   Psychiatric/Behavioral: Negative.       Today's Vitals   03/10/22 1021  BP: 122/78  Pulse: (!) 112  Temp: 98.5 F (36.9 C)  TempSrc: Oral  Weight: (!) 313 lb (142 kg)  Height: _0  (1.651 m)   Body mass index is 52.09 kg/m.  Wt Readings  from Last 3 Encounters:  03/10/22 (!) 313 lb (142 kg)  03/04/22 (!) 339 lb 8.1 oz (154 kg)  12/12/21 (!) 341 lb (154.7 kg)    Objective:  Physical Exam Vitals reviewed.  Constitutional:      General: She is not in acute distress.    Appearance: Normal appearance.  Cardiovascular:  Rate and Rhythm: Normal rate and regular rhythm.     Pulses: Normal pulses.     Heart sounds: Normal heart sounds. No murmur heard. Pulmonary:     Effort: Pulmonary effort is normal. No respiratory distress.     Breath sounds: Normal breath sounds. No wheezing.  Musculoskeletal:        General: No swelling or tenderness. Normal range of motion.  Skin:    General: Skin is warm and dry.  Neurological:     General: No focal deficit present.     Mental Status: She is alert and oriented to person, place, and time.     Cranial Nerves: No cranial nerve deficit.     Motor: No weakness.  Psychiatric:        Mood and Affect: Mood normal.        Behavior: Behavior normal.        Thought Content: Thought content normal.        Judgment: Judgment normal.         Assessment And Plan:     1. Uncontrolled type 2 diabetes mellitus with hyperglycemia [facility]) Comments: She is to follow up with Dr Charlett Lango scheduled in October. Discussed having multiple providers to manage her diabetes and the importance of regular f/u.  - Hemoglobin A1c - Semaglutide, 2 MG/DOSE, (OZEMPIC, 2 MG/DOSE,) 8 MG/3ML SOPN; INJECT UNDER THE SKIN ONCE A WEEK  Dispense: 9 mL; Refill: 0  2. Essential hypertension Comments: Blood pressure is well controlled, continue current medications.  - BMP8+EGFR  3. Mixed hyperlipidemia Comments: Triglycerides had worsened at last visit, encouraged to avoid breads and sweets. Will check lipid panel.  - BMP8+EGFR - Lipid panel  4. Morbid obesity with body mass index (BMI) of 50.0 to 59.9 in adult [facility]) Comments: Congratulated on her 28 lb weight loss, continue with healthy diet and regular  exercise when possible.   Patient was given opportunity to ask questions. Patient verbalized understanding of the plan and was able to repeat key elements of the plan. All questions were answered to their satisfaction.  Stephanie Brine, FNP   I, Stephanie Brine, FNP, have reviewed all documentation for this visit. The documentation on 03/10/22 for the exam, diagnosis, procedures, and orders are all accurate and complete.   IF YOU HAVE BEEN REFERRED TO A SPECIALIST, IT MAY TAKE 1-2 WEEKS TO SCHEDULE/PROCESS THE REFERRAL. IF YOU HAVE NOT HEARD FROM US/SPECIALIST IN TWO WEEKS, PLEASE GIVE [place] A CALL AT [phone].   THE PATIENT IS ENCOURAGED TO PRACTICE SOCIAL DISTANCING DUE TO THE COVID-19 PANDEMIC.

## 2022-03-14 ENCOUNTER — Ambulatory Visit (INDEPENDENT_AMBULATORY_CARE_PROVIDER_SITE_OTHER): Payer: Medicaid Other

## 2022-03-14 ENCOUNTER — Ambulatory Visit (INDEPENDENT_AMBULATORY_CARE_PROVIDER_SITE_OTHER): Payer: Medicaid Other | Admitting: Podiatry

## 2022-03-14 DIAGNOSIS — L97521 Non-pressure chronic ulcer of other part of left foot limited to breakdown of skin: Secondary | ICD-10-CM | POA: Diagnosis not present

## 2022-03-14 DIAGNOSIS — L84 Corns and callosities: Secondary | ICD-10-CM

## 2022-03-16 NOTE — Progress Notes (Signed)
Subjective: Chief Complaint  Patient presents with   Diabetes     Pt came in today for a follow-up A1c- 6.9 BG- didn't take it today 2nd toe on the left foot has a ulcer, started a week ago, pt states just a little sore.    41 year old female presents the office today for evaluation of a new lesion at the tip of her left second toe.  She recently did get new shoes which has been very helpful for the other calluses but she is doing more on her feet with working to a lot of walking up and down steps as well and the second toe has become irritated.  No drainage at present she reports that she is diabetic and she is concerned about this.  Her A1c has improved to 6.9.  She is also lost weight since she started the new job.  Denies any fevers or chills.  Objective: AAO x3, NAD DP/PT pulses palpable bilaterally, CRT less than 3 seconds Flatfoot present.  Hyperkeratotic lesion medial hallux IPJ bilaterally left side worse than right but there is no ongoing ulceration drainage or signs of infection.  There is a new lesion of the tip of the left second toe which appears to be a dried hemorrhagic blister was able to debride this today.  There is no drainage or pus noted and there is no edema, erythema or signs of infection at this time. No pain with calf compression, swelling, warmth, erythema  Assessment: Ulcer left second toe  Plan: -All treatment options discussed with the patient including all alternatives, risks, complications.  -X-rays obtained and reviewed.  3 views of the left foot were obtained.  No evidence of acute fracture, osteomyelitis.  Bunions present.  Digital deformity noted of the second toe on the lateral view. -Sharply debrided the hyperkeratotic lesion hallux without any complications or bleeding. -Sharply debrided the skin lesion left second toe without any complications.  Recommended small mount of antibiotic ointment dressing daily.  Dispensed offloading pads.  Discussed daily foot  inspection.  Monitor for any signs or symptoms of infection. -Patient encouraged to call the office with any questions, concerns, change in symptoms.   Return in about 4 weeks (around 04/11/2022).  Trula Slade DPM

## 2022-03-18 ENCOUNTER — Ambulatory Visit: Payer: Medicaid Other | Admitting: Podiatry

## 2022-04-03 DIAGNOSIS — H16223 Keratoconjunctivitis sicca, not specified as Sjogren's, bilateral: Secondary | ICD-10-CM | POA: Diagnosis not present

## 2022-04-03 LAB — HM DIABETES EYE EXAM

## 2022-04-14 DIAGNOSIS — H5213 Myopia, bilateral: Secondary | ICD-10-CM | POA: Diagnosis not present

## 2022-04-28 ENCOUNTER — Encounter: Payer: Self-pay | Admitting: Nurse Practitioner

## 2022-04-28 ENCOUNTER — Ambulatory Visit (INDEPENDENT_AMBULATORY_CARE_PROVIDER_SITE_OTHER): Payer: Medicaid Other | Admitting: Nurse Practitioner

## 2022-04-28 VITALS — BP 138/80 | HR 113 | Temp 99.5°F | Ht 65.0 in | Wt 318.0 lb

## 2022-04-28 DIAGNOSIS — R52 Pain, unspecified: Secondary | ICD-10-CM | POA: Diagnosis not present

## 2022-04-28 DIAGNOSIS — I10 Essential (primary) hypertension: Secondary | ICD-10-CM | POA: Diagnosis not present

## 2022-04-28 DIAGNOSIS — Z0000 Encounter for general adult medical examination without abnormal findings: Secondary | ICD-10-CM

## 2022-04-28 DIAGNOSIS — Z6841 Body Mass Index (BMI) 40.0 and over, adult: Secondary | ICD-10-CM

## 2022-04-28 DIAGNOSIS — F5102 Adjustment insomnia: Secondary | ICD-10-CM | POA: Diagnosis not present

## 2022-04-28 DIAGNOSIS — R197 Diarrhea, unspecified: Secondary | ICD-10-CM | POA: Diagnosis not present

## 2022-04-28 DIAGNOSIS — E782 Mixed hyperlipidemia: Secondary | ICD-10-CM

## 2022-04-28 DIAGNOSIS — E669 Obesity, unspecified: Secondary | ICD-10-CM

## 2022-04-28 DIAGNOSIS — Z113 Encounter for screening for infections with a predominantly sexual mode of transmission: Secondary | ICD-10-CM | POA: Diagnosis not present

## 2022-04-28 DIAGNOSIS — E1169 Type 2 diabetes mellitus with other specified complication: Secondary | ICD-10-CM | POA: Diagnosis not present

## 2022-04-28 LAB — POCT URINALYSIS DIPSTICK
Bilirubin, UA: NEGATIVE
Blood, UA: NEGATIVE
Glucose, UA: POSITIVE — AB
Ketones, UA: NEGATIVE
Leukocytes, UA: NEGATIVE
Nitrite, UA: NEGATIVE
Protein, UA: NEGATIVE
Spec Grav, UA: 1.02 (ref 1.010–1.025)
Urobilinogen, UA: 0.2 E.U./dL
pH, UA: 6.5 (ref 5.0–8.0)

## 2022-04-28 MED ORDER — MELATONIN 5 MG PO TABS: 5.0000 mg | ORAL_TABLET | Freq: Every evening | ORAL | 1 refills | Status: DC | PRN

## 2022-04-28 NOTE — Patient Instructions (Signed)

## 2022-04-28 NOTE — Progress Notes (Unsigned)
I,Tianna Badgett,acting as a Education administrator for [employer], FNP.,have documented all relevant documentation on the behalf of Minette Brine, FNP,as directed by  Minette Brine, FNP while in the presence of Minette Brine, [place].  Subjective:     Patient ID: [name] , female    DOB: 1981/04/26 , 41 y.o.   MRN: [id number]   Chief Complaint  Patient presents with   Annual Exam    HPI  The patient is here today for a physical examination.  Patient is followed by GYN she is not sure of the name. She feels like she may be around her menstrual cycle time.   She has been under more stress with her kids in the last 2 weeks. Her son has been doing more things that have caused her more stress.     Past Medical History:  Diagnosis Date   Abnormal Pap smear    f/u wnl   Asthma    Fibroid    Gestational diabetes    Hypertension    Morbid obesity ([place])    Sleep apnea    Urinary tract infection      Family History  Problem Relation Age of Onset   Diabetes Father    Allergies Mother    Asthma Mother    Hypertension Mother    Diabetes Mother    Diabetes Maternal Grandmother    Heart disease Maternal Grandmother    Anesthesia problems Neg Hx    Hypotension Neg Hx    Malignant hyperthermia Neg Hx    Pseudochol deficiency Neg Hx    Hearing loss Neg Hx      Current Outpatient Medications:    melatonin 5 MG TABS, Take 1 tablet (5 mg total) by mouth at bedtime as needed., Disp: 90 tablet, Rfl: 1   albuterol (PROVENTIL) (5 MG/ML) 0.5% nebulizer solution, Take 0.5 mLs (2.5 mg total) by nebulization every 6 (six) hours as needed for wheezing or shortness of breath., Disp: 20 mL, Rfl: 12   albuterol (VENTOLIN HFA) 108 (90 Base) MCG/ACT inhaler, Inhale 1-2 puffs into the lungs every 6 (six) hours as needed for wheezing or shortness of breath., Disp: 18 g, Rfl: 0   atorvastatin (LIPITOR) 20 MG tablet, TAKE 1 TABLET(20 MG) BY MOUTH DAILY, Disp: 90 tablet, Rfl: 1   blood glucose meter kit and supplies  KIT, Dispense based on patient and insurance preference. Use up to four times daily as directed. (FOR ICD-9 250.00, 250.01)., Disp: 1 each, Rfl: 0   cetirizine (ZYRTEC ALLERGY) 10 MG tablet, Take 1 tablet (10 mg total) by mouth daily., Disp: 90 tablet, Rfl: 2   cyclobenzaprine (FLEXERIL) 10 MG tablet, Take 1 tablet (10 mg total) by mouth 3 (three) times daily as needed for muscle spasms., Disp: 20 tablet, Rfl: 0   dapagliflozin propanediol (FARXIGA) 10 MG TABS tablet, Take 1 tablet (10 mg total) by mouth daily., Disp: 90 tablet, Rfl: 1   diclofenac (VOLTAREN) 50 MG EC tablet, Take 1 tablet (50 mg total) by mouth 3 (three) times daily., Disp: 45 tablet, Rfl: 3   ferrous sulfate 325 (65 FE) MG EC tablet, Take 1 tablet (325 mg total) by mouth 3 (three) times daily with meals., Disp: 90 tablet, Rfl: 2   ferrous sulfate 325 (65 FE) MG EC tablet, Take 1 tablet (325 mg total) by mouth 2 (two) times daily., Disp: 60 tablet, Rfl: 3   fluticasone (FLONASE) 50 MCG/ACT nasal spray, Place 1-2 sprays into both nostrils daily., Disp: 16 g,  Rfl: 0   glucose blood test strip, USE TO CHECK BLOOD SUGARS UP TO 4 TIMES DAILY  DX CODE:E11.9, Disp: 100 each, Rfl: 12   HYDROcodone-acetaminophen (NORCO/VICODIN) 5-325 MG tablet, Take 1 tablet by mouth every 6 (six) hours as needed for moderate pain., Disp: 14 tablet, Rfl: 0   ibuprofen (ADVIL) 800 MG tablet, Take 800 mg by mouth 3 (three) times daily., Disp: , Rfl:    insulin degludec (TRESIBA FLEXTOUCH) 100 UNIT/ML FlexTouch Pen, Inject 45 Units into the skin daily., Disp: 45 mL, Rfl: 2   Insulin Pen Needle 31G X 8 MM MISC, 1 Device by Does not apply route daily., Disp: 100 each, Rfl: 1   Lancets (ACCU-CHEK MULTICLIX) lancets, USE TO CHECK BLOOD SUGARS UP TO 4 TIMES DAILY  DX CODE:E11.9, Disp: 100 each, Rfl: 12   levonorgestrel (MIRENA) 20 MCG/24HR IUD, 1 each by Intrauterine route once., Disp: , Rfl:    losartan-hydrochlorothiazide (HYZAAR) 50-12.5 MG tablet, TAKE 1 TABLET  BY MOUTH DAILY, Disp: 90 tablet, Rfl: 1   metFORMIN (GLUCOPHAGE-XR) 750 MG 24 hr tablet, TAKE 1 TABLET(750 MG) BY MOUTH IN THE MORNING AND AT BEDTIME, Disp: 180 tablet, Rfl: 1   propranolol (INDERAL) 10 MG tablet, TAKE 1 TABLET(10 MG) BY MOUTH EVERY EVENING. No refills until pt is seen., Disp: 30 tablet, Rfl: 0   Semaglutide, 2 MG/DOSE, (OZEMPIC, 2 MG/DOSE,) 8 MG/3ML SOPN, INJECT UNDER THE SKIN ONCE A WEEK, Disp: 9 mL, Rfl: 0   triamcinolone (KENALOG) 0.025 % ointment, Apply 1 application topically 2 (two) times daily., Disp: 30 g, Rfl: 0   Vitamin D, Ergocalciferol, (DRISDOL) 1.25 MG (50000 UNIT) CAPS capsule, Take 1 capsule by mouth on Tues and on Fridays., Disp: 24 capsule, Rfl: 0   Allergies  Allergen Reactions   Other Anaphylaxis and Other (See Comments)    Pt has fish allergy Pt has allergy to butter cookies   Penicillins Rash and Other (See Comments)    Told MD that PCN reaction is rash and that she has taken amoxicillin before   Bactrim [Sulfamethoxazole-Trimethoprim]     Diarrhea, abdominal cramps and rash   Fish Allergy     Other reaction(s): Unknown      The patient states she uses IUD for birth control.  No LMP recorded. (Menstrual status: IUD). She will have occasional spotting for her menstrual cycle.  Negative for: breast discharge, breast lump(s), breast pain and breast self exam. Associated symptoms include abnormal vaginal bleeding. Pertinent negatives include abnormal bleeding (hematology), anxiety, decreased libido, depression, difficulty falling sleep, dyspareunia, history of infertility, nocturia, sexual dysfunction, sleep disturbances, urinary incontinence, urinary urgency, vaginal discharge and vaginal itching. Diet regular. 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 She is working 5 - 6 days a week.   The patient's tobacco use is:  Social History   Tobacco Use   Smoking Status Former   Types: Cigarettes   Quit date: 08/08/2019   Years since quitting: 2.7  Smokeless Tobacco Former   She has been exposed to passive smoke. The patient's alcohol use is:  Social History   Substance and Sexual Activity  Alcohol Use No   Comment: occasional   Additional information: Last pap 06/13/2019, next one scheduled for 06/12/2022.    Review of Systems  Constitutional: Negative.   HENT: Negative.    Eyes: Negative.   Respiratory: Negative.  Cardiovascular: Negative.   Gastrointestinal:  Positive for diarrhea.  Endocrine: Negative.   Genitourinary: Negative.   Musculoskeletal:  Positive for myalgias and neck stiffness (describes having neck tension took muscle relaxer).  Skin: Negative.   Allergic/Immunologic: Negative.   Neurological:  Positive for headaches (over her left eye).  Hematological: Negative.   Psychiatric/Behavioral:  Positive for sleep disturbance.      Today's Vitals   04/28/22 1059  BP: 138/80  Pulse: (!) 113  Temp: 99.5 F (37.5 C)  TempSrc: Oral  Weight: (!) 318 lb (144.2 kg)  Height: 5' 5" (1.651 m)   Body mass index is 52.92 kg/m.   Objective:  Physical Exam Vitals reviewed.  Constitutional:      General: She is not in acute distress.    Appearance: Normal appearance. She is well-developed. She is obese.  HENT:     Head: Normocephalic and atraumatic.     Right Ear: Hearing, tympanic membrane, ear canal and external ear normal. There is no impacted cerumen.     Left Ear: Hearing, tympanic membrane, ear canal and external ear normal. There is no impacted cerumen.     Nose:     Comments: Deferred - masked    Mouth/Throat:     Comments: Deferred - masked Eyes:     General: Lids are normal.     Extraocular Movements: Extraocular movements intact.     Conjunctiva/sclera: Conjunctivae normal.     Pupils: Pupils are equal, round, and reactive to light.     Funduscopic exam:    Right eye: No papilledema.        Left  eye: No papilledema.  Neck:     Thyroid: No thyroid mass.     Vascular: No carotid bruit.  Cardiovascular:     Rate and Rhythm: Normal rate and regular rhythm.     Pulses: Normal pulses.     Heart sounds: Normal heart sounds. No murmur heard. Pulmonary:     Effort: Pulmonary effort is normal. No respiratory distress.     Breath sounds: Normal breath sounds. No wheezing.  Chest:     Chest wall: No mass.  Breasts:    Tanner Score is 5.     Right: Normal. No mass or tenderness.     Left: Normal. No mass or tenderness.  Abdominal:     General: Abdomen is flat. Bowel sounds are normal. There is no distension.     Palpations: Abdomen is soft.     Tenderness: There is no abdominal tenderness.  Genitourinary:    Rectum: Guaiac result negative.  Musculoskeletal:        General: No swelling. Normal range of motion.     Cervical back: Full passive range of motion without pain, normal range of motion and neck supple.     Right lower leg: No edema.     Left lower leg: No edema.  Lymphadenopathy:     Upper Body:     Right upper body: No supraclavicular, axillary or pectoral adenopathy.     Left upper body: No supraclavicular, axillary or pectoral adenopathy.  Skin:    General: Skin is warm and dry.     Capillary Refill: Capillary refill takes less than 2 seconds.  Neurological:     General: No focal deficit present.     Mental Status: She is alert and oriented to person, place, and time.     Cranial Nerves: No cranial nerve deficit.     Sensory: No sensory deficit.  Motor: No weakness.  Psychiatric:        Mood and Affect: Mood normal.        Behavior: Behavior normal.        Thought Content: Thought content normal.        Judgment: Judgment normal.         Assessment And Plan:     1. Routine general medical examination at a health care facility Behavior modifications discussed and diet history reviewed.   Pt will continue to exercise regularly and modify diet with low GI,  plant based foods and decrease intake of processed foods.  Recommend intake of daily multivitamin, Vitamin D, and calcium.  Recommend mammogram for preventive screenings, as well as recommend immunizations that include influenza, TDAP  2. Type 2 diabetes mellitus with obesity ([place]) Comments: HgbA1c at last visit was improved, continue current medications. Continue f/u with Dr Charlett Lango - EKG 12-Lead - POCT Urinalysis Dipstick (81002) - Microalbumin / Creatinine Urine Ratio  3. Diarrhea, unspecified type Comments: Likely related to upcoming menstruation, encouraged to stay well hydrated and avoid foods high in fiber - Novel Coronavirus, NAA ([facility])  4. Body aches - Novel Coronavirus, NAA ([facility])  5. Essential hypertension Comments: Blood pressure controlled, EKG done   6. Mixed hyperlipidemia Comments: Continue statin, continue statin  7. Adjustment insomnia Comments: Having stressors with her son, encouraged to take melatonin as needed and to consider changing to dayshift as night shift may be causing stressors.  - melatonin 5 MG TABS; Take 1 tablet (5 mg total) by mouth at bedtime as needed.  Dispense: 90 tablet; Refill: 1  8. Class 3 severe obesity due to excess calories with serious comorbidity and body mass index (BMI) of 50.0 to 59.9 in adult [facility]) Chronic Discussed healthy diet and regular exercise options  Encouraged to exercise at least 150 minutes per week with 2 days of strength training  9. Encounter for screening examination for sexually transmitted disease    Patient was given opportunity to ask questions. Patient verbalized understanding of the plan and was able to repeat key elements of the plan. All questions were answered to their satisfaction.   Minette Brine, FNP   I, Minette Brine, FNP, have reviewed all documentation for this visit. The documentation on 04/28/22 for the exam, diagnosis, procedures, and orders are all accurate and complete.   THE  PATIENT IS ENCOURAGED TO PRACTICE SOCIAL DISTANCING DUE TO THE COVID-19 PANDEMIC.

## 2022-04-29 ENCOUNTER — Encounter: Payer: Self-pay | Admitting: Nurse Practitioner

## 2022-05-01 DIAGNOSIS — E782 Mixed hyperlipidemia: Secondary | ICD-10-CM | POA: Insufficient documentation

## 2022-05-02 ENCOUNTER — Ambulatory Visit (INDEPENDENT_AMBULATORY_CARE_PROVIDER_SITE_OTHER): Payer: Medicaid Other | Admitting: Podiatry

## 2022-05-02 DIAGNOSIS — M21619 Bunion of unspecified foot: Secondary | ICD-10-CM | POA: Diagnosis not present

## 2022-05-02 DIAGNOSIS — B351 Tinea unguium: Secondary | ICD-10-CM

## 2022-05-03 NOTE — Progress Notes (Signed)
Subjective: Chief Complaint  Patient presents with   Diabetic Ulcer    4 week follow up left toe     41 year old female presents the office today for calluses to both of her feet as well as she noticed some dark discoloration to the left first and second digit toenail.  She states that she noticed rubbing on the toenails and since then she has a small, dark discoloration.  Not able to sleep as she has acrylic toenail polish on.  She is not having any pain with this that she reports no swelling redness or any drainage.  No recent treatment.    Objective: AAO x3, NAD DP/PT pulses palpable bilaterally, CRT less than 3 seconds Flatfoot present.  Hyperkeratotic lesion medial hallux IPJ bilaterally left side worse than right but there is no ongoing ulceration drainage or signs of infection.  No lesion noted on the second toe today.  Left hallux nail appears to be somewhat loose distally but currently approximately.  I am not able to visualize any hyperpigmentation under the surrounding skin.  She has acrylic nail polish not able to remove all of this.  Small subungual hematoma noted.  No edema, erythema or signs of infection.   No pain with calf compression, swelling, warmth, erythema  Assessment: Preulcerative calluses, sternal hematoma toenail  Plan: -All treatment options discussed with the patient including all alternatives, risks, complications.  -Sharply debrided the hyperkeratotic lesion hallux without any complications or bleeding as a courtesy but discussed offloading and possible surgical intervention if needed. -Plan to monitor the discoloration of the hallux, second digit toenail left foot.  Monitor any signs of infection.  Discussed that nail polish removed holding off on reapplying this.  Discussed biotin supplements as well to help with the nail to grow out.  Could also be a fungal component to this on the monitor.  Monitor for any signs or symptoms of infection.  Trula Slade  DPM

## 2022-05-06 ENCOUNTER — Encounter: Payer: Self-pay | Admitting: Nurse Practitioner

## 2022-05-26 ENCOUNTER — Encounter: Payer: Self-pay | Admitting: Internal Medicine

## 2022-05-29 ENCOUNTER — Ambulatory Visit (INDEPENDENT_AMBULATORY_CARE_PROVIDER_SITE_OTHER): Payer: Medicaid Other | Admitting: Internal Medicine

## 2022-05-29 ENCOUNTER — Encounter: Payer: Self-pay | Admitting: Internal Medicine

## 2022-05-29 VITALS — BP 128/80 | HR 100 | Ht 65.0 in | Wt 314.0 lb

## 2022-05-29 DIAGNOSIS — Z794 Long term (current) use of insulin: Secondary | ICD-10-CM

## 2022-05-29 DIAGNOSIS — E119 Type 2 diabetes mellitus without complications: Secondary | ICD-10-CM

## 2022-05-29 DIAGNOSIS — E1165 Type 2 diabetes mellitus with hyperglycemia: Secondary | ICD-10-CM

## 2022-05-29 LAB — POCT GLYCOSYLATED HEMOGLOBIN (HGB A1C): Hemoglobin A1C: 6.2 % — AB (ref 4.0–5.6)

## 2022-05-29 LAB — POCT GLUCOSE (DEVICE FOR HOME USE): Glucose Fasting, POC: 89 mg/dL (ref 70–99)

## 2022-05-29 MED ORDER — OZEMPIC (2 MG/DOSE) 8 MG/3ML ~~LOC~~ SOPN: 2.0000 mg | PEN_INJECTOR | SUBCUTANEOUS | 3 refills | Status: DC

## 2022-05-29 MED ORDER — ACCU-CHEK GUIDE W/DEVICE KIT: 1.0000 | PACK | Freq: Every day | 0 refills | Status: DC

## 2022-05-29 MED ORDER — DAPAGLIFLOZIN PROPANEDIOL 10 MG PO TABS: 10.0000 mg | ORAL_TABLET | Freq: Every day | ORAL | 3 refills | Status: DC

## 2022-05-29 MED ORDER — ACCU-CHEK GUIDE VI STRP: 1.0000 | ORAL_STRIP | Freq: Every day | 3 refills | Status: DC

## 2022-05-29 MED ORDER — ACCU-CHEK MULTICLIX LANCETS MISC: 12 refills | Status: AC

## 2022-05-29 MED ORDER — METFORMIN HCL ER 750 MG PO TB24: 1500.0000 mg | ORAL_TABLET | Freq: Every day | ORAL | 3 refills | Status: DC

## 2022-05-29 MED ORDER — TRESIBA FLEXTOUCH 100 UNIT/ML ~~LOC~~ SOPN: 40.0000 [IU] | PEN_INJECTOR | Freq: Every day | SUBCUTANEOUS | 3 refills | Status: DC

## 2022-05-29 NOTE — Progress Notes (Signed)
Name: [name]  MRN/ DOB: [id number], 01/07/1981   Age/ Sex: 41 y.o., female    PCP: Minette Brine, FNP   Reason for Endocrinology Evaluation: Type 2 Diabetes Mellitus     Date of Initial Endocrinology Visit: 11/30/2020    PATIENT IDENTIFIER: [name] is a 41 y.o. female with a past medical history of T2DM, OSA, Dyslipidemia and HTN. The patient presented for initial endocrinology clinic visit on 11/30/2020 for consultative assistance with her diabetes management.    HPI: Ms. Broome was    Diagnosed with gestational diabetes in 2012  And never resolved after delivery Hemoglobin A1c has ranged from 7.1% in 2021, peaking at 11.1 in 2022.  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  She was lost to follow-up for approximately 18 months before her return to our clinic again.  SUBJECTIVE:   During the last visit (11/30/2020): A1c 10.0%  Today (05/29/22): [name] is here for follow-up on diabetes management.  She checks her blood sugars 0 times daily as she lost her machine. The patient in unknown to have hypoglycemic episodes since the last clinic visit.   She has UTI in 12/2021  There's backorder on Ozempic  Denies nausea, vomiting or diarrhea    HOME DIABETES REGIMEN: Tresiba 45 units daily  Metformin 750 mg XR 2 tabs daily  Ozempic 2 mg weekly ( sundays )  Farxiga 10 mg daily      Statin: yes ACE-I/ARB: yes Prior Diabetic Education: yes   METER DOWNLOAD SUMMARY: Did not bring    DIABETIC COMPLICATIONS: Microvascular complications:  Denies: CKD,  Neuropathy, retinopathy  Last eye exam: Completed 2021  Macrovascular complications:   Denies: CAD, PVD, CVA   PAST HISTORY: Past Medical History:  Past Medical History:  Diagnosis Date   Abnormal Pap smear    f/u wnl   Asthma    Fibroid    Gestational  diabetes    Hypertension    Morbid obesity ([place])    Sleep apnea    Urinary tract infection    Past Surgical History:  Past Surgical History:  Procedure Laterality Date   CESAREAN SECTION     DILATION AND CURETTAGE OF UTERUS     TUBAL LIGATION      Social History:  reports that she quit smoking about 2 years ago. Her smoking use included cigarettes. She has quit using smokeless tobacco. She reports that she does not drink alcohol and does not use drugs. Family History:  Family History  Problem Relation Age of Onset   Diabetes Father    Allergies Mother    Asthma Mother    Hypertension Mother    Diabetes Mother    Diabetes Maternal Grandmother    Heart disease Maternal Grandmother    Anesthesia problems Neg Hx    Hypotension Neg Hx    Malignant hyperthermia Neg Hx    Pseudochol deficiency Neg Hx    Hearing loss Neg Hx      HOME MEDICATIONS: Allergies as of 05/29/2022       Reactions   Other Anaphylaxis, Other (See Comments)   Pt has fish allergy Pt has allergy to butter cookies   Penicillins Rash, Other (See Comments)   Told MD that PCN reaction is rash and that she has taken amoxicillin before   Bactrim [sulfamethoxazole-trimethoprim]  Diarrhea, abdominal cramps and rash   Fish Allergy    Other reaction(s): Unknown        Medication List        Accurate as of May 29, 2022  9:48 AM. If you have any questions, ask your nurse or doctor.          accu-chek multiclix lancets USE TO CHECK BLOOD SUGARS UP TO 4 TIMES DAILY  DX CODE:E11.9   albuterol (5 MG/ML) 0.5% nebulizer solution Commonly known as: PROVENTIL Take 0.5 mLs (2.5 mg total) by nebulization every 6 (six) hours as needed for wheezing or shortness of breath.   albuterol 108 (90 Base) MCG/ACT inhaler Commonly known as: VENTOLIN HFA Inhale 1-2 puffs into the lungs every 6 (six) hours as needed for wheezing or shortness of breath.   atorvastatin 20 MG tablet Commonly known as:  LIPITOR TAKE 1 TABLET(20 MG) BY MOUTH DAILY   blood glucose meter kit and supplies Kit Dispense based on patient and insurance preference. Use up to four times daily as directed. (FOR ICD-9 250.00, 250.01).   cetirizine 10 MG tablet Commonly known as: ZyrTEC Allergy Take 1 tablet (10 mg total) by mouth daily.   cyclobenzaprine 10 MG tablet Commonly known as: FLEXERIL Take 1 tablet (10 mg total) by mouth 3 (three) times daily as needed for muscle spasms.   dapagliflozin propanediol 10 MG Tabs tablet Commonly known as: Farxiga Take 1 tablet (10 mg total) by mouth daily.   diclofenac 50 MG EC tablet Commonly known as: VOLTAREN Take 1 tablet (50 mg total) by mouth 3 (three) times daily.   ferrous sulfate 325 (65 FE) MG EC tablet Take 1 tablet (325 mg total) by mouth 3 (three) times daily with meals.   ferrous sulfate 325 (65 FE) MG EC tablet Take 1 tablet (325 mg total) by mouth 2 (two) times daily.   fluticasone 50 MCG/ACT nasal spray Commonly known as: FLONASE Place 1-2 sprays into both nostrils daily.   glucose blood test strip USE TO CHECK BLOOD SUGARS UP TO 4 TIMES DAILY  DX CODE:E11.9   HYDROcodone-acetaminophen 5-325 MG tablet Commonly known as: NORCO/VICODIN Take 1 tablet by mouth every 6 (six) hours as needed for moderate pain.   ibuprofen 800 MG tablet Commonly known as: ADVIL Take 800 mg by mouth 3 (three) times daily.   Insulin Pen Needle 31G X 8 MM Misc 1 Device by Does not apply route daily.   levonorgestrel 20 MCG/24HR IUD Commonly known as: MIRENA 1 each by Intrauterine route once.   losartan-hydrochlorothiazide 50-12.5 MG tablet Commonly known as: HYZAAR TAKE 1 TABLET BY MOUTH DAILY   melatonin 5 MG Tabs Take 1 tablet (5 mg total) by mouth at bedtime as needed.   metFORMIN 750 MG 24 hr tablet Commonly known as: GLUCOPHAGE-XR TAKE 1 TABLET(750 MG) BY MOUTH IN THE MORNING AND AT BEDTIME   Ozempic (2 MG/DOSE) 8 MG/3ML Sopn Generic drug:  Semaglutide (2 MG/DOSE) INJECT UNDER THE SKIN ONCE A WEEK   propranolol 10 MG tablet Commonly known as: INDERAL TAKE 1 TABLET(10 MG) BY MOUTH EVERY EVENING. No refills until pt is seen.   Tyler Aas FlexTouch 100 UNIT/ML FlexTouch Pen Generic drug: insulin degludec Inject 45 Units into the skin daily.   triamcinolone 0.025 % ointment Commonly known as: KENALOG Apply 1 application topically 2 (two) times daily.   Vitamin D (Ergocalciferol) 1.25 MG (50000 UNIT) Caps capsule Commonly known as: DRISDOL Take 1 capsule by mouth on Tues and on Fridays.  ALLERGIES: Allergies  Allergen Reactions   Other Anaphylaxis and Other (See Comments)    Pt has fish allergy Pt has allergy to butter cookies   Penicillins Rash and Other (See Comments)    Told MD that PCN reaction is rash and that she has taken amoxicillin before   Bactrim [Sulfamethoxazole-Trimethoprim]     Diarrhea, abdominal cramps and rash   Fish Allergy     Other reaction(s): Unknown        OBJECTIVE:   VITAL SIGNS: BP 128/80 (BP Location: Left Arm, Patient Position: Sitting, Cuff Size: Large)   Pulse 100   Ht '5\' 5"'  (1.651 m)   Wt (!) 314 lb (142.4 kg)   SpO2 95%   BMI 52.25 kg/m    PHYSICAL EXAM:  General: Pt appears well and is in NAD  Neck: General: Supple without adenopathy or carotid bruits. Thyroid: Thyroid size normal.  No goiter or nodules appreciated  Lungs: Clear with good BS bilat with no rales, rhonchi, or wheezes  Heart: RRR with normal S1 and S2 and no gallops; no murmurs; no rub  Abdomen: Normoactive bowel sounds, soft, nontender, without masses or organomegaly palpable  Extremities:  Lower extremities - No pretibial edema. No lesions.  Skin: Normal texture and temperature to palpation. No rash noted.   Neuro: MS is good with appropriate affect, pt is alert and Ox3    DM foot exam: per podiatry 04/2022     DATA REVIEWED:  Lab Results  Component Value Date   HGBA1C 6.2 (A)  05/29/2022   HGBA1C 6.9 (H) 03/10/2022   HGBA1C 8.2 (H) 08/21/2021   Lab Results  Component Value Date   MICROALBUR 30 04/18/2021   LDLCALC 130 (H) 03/10/2022   CREATININE 0.79 03/10/2022   Lab Results  Component Value Date   MICRALBCREAT <30 04/18/2021    Lab Results  Component Value Date   CHOL 191 03/10/2022   HDL 40 03/10/2022   LDLCALC 130 (H) 03/10/2022   TRIG 115 03/10/2022   CHOLHDL 4.8 (H) 03/10/2022       In office BG 89 mg/dL  ASSESSMENT / PLAN / RECOMMENDATIONS:   1) Type 2 Diabetes Mellitus, OPtimally  controlled, Without complications - Most recent A1c of 6.2 %. Goal A1c < 7.0 %.    - Praised the pt on optimizing glucose control and weight loss  - Will reduce basal insulin due to tight BG in the office today of 89 mg/DL -I am currently patient to continue with lifestyle changes   MEDICATIONS:  -Decrease tresiba to 40 units daily  -Continue Farxiga 10 mg, 1 tablet in the morning  - Continue Metformin 750 mg, 1 tablet twice daily  - Continue Ozempic 2 mg weekly    EDUCATION / INSTRUCTIONS: BG monitoring instructions: Patient is instructed to check her blood sugars 1 times a day, fasting. Call [facility] if: BG persistently < 70  I reviewed the Rule of 15 for the treatment of hypoglycemia in detail with the patient. Literature supplied.   2) Diabetic complications:  Eye: Does not have known diabetic retinopathy.  Neuro/ Feet: Does not have known diabetic peripheral neuropathy. Renal: Patient does not have known baseline CKD. She is on an ACEI/ARB at present.      F/U in 6 months     Signed electronically by: Mack Guise, MD  [facility] Endocrinology  [facility] [address] [place], [place] [address] Phone: [phone] FAX: [phone]   CC:  Minette Brine, [address] [place] [address] Phone: [phone]  Fax: [phone]    Return to Endocrinology  clinic as below: Future Appointments  Date Time Provider [place]  08/05/2022  4:15 PM Trula Slade, [place] [facility]  08/28/2022 11:20 AM Minette Brine, FNP [facility] None  05/04/2023 10:00 AM Minette Brine, FNP [facility] None

## 2022-05-29 NOTE — Patient Instructions (Addendum)
-   Decrease  Tresiba to 40 units daily  - Continue  Farxiga to 10 mg, 1 tablet in the morning  - Continue Metformin 750 mg, 2 tablets daily  - Continue Ozempic 2 mg weekly     Choose healthy, lower carb lower calorie snacks: toss salad, vegetables, cottage cheese, peanut butter, low fat cheese / string cheese, lower sodium deli meat, tuna salad or chicken salad   HOW TO TREAT LOW BLOOD SUGARS (Blood sugar LESS THAN 70 MG/DL) Please follow the RULE OF 15 for the treatment of hypoglycemia treatment (when your (blood sugars are less than 70 mg/dL)   STEP 1: Take 15 grams of carbohydrates when your blood sugar is low, which includes:  3-4 GLUCOSE TABS  OR 3-4 OZ OF JUICE OR REGULAR SODA OR ONE TUBE OF GLUCOSE GEL    STEP 2: RECHECK blood sugar in 15 MINUTES STEP 3: If your blood sugar is still low at the 15 minute recheck --> then, go back to STEP 1 and treat AGAIN with another 15 grams of carbohydrates.

## 2022-05-30 ENCOUNTER — Other Ambulatory Visit: Payer: Self-pay | Admitting: Nurse Practitioner

## 2022-05-30 DIAGNOSIS — I10 Essential (primary) hypertension: Secondary | ICD-10-CM

## 2022-05-31 ENCOUNTER — Other Ambulatory Visit: Payer: Self-pay | Admitting: Internal Medicine

## 2022-05-31 DIAGNOSIS — E119 Type 2 diabetes mellitus without complications: Secondary | ICD-10-CM

## 2022-06-02 NOTE — Telephone Encounter (Signed)
Left message for patient to callback to let me know if she would like it to go to a different pharmacy.

## 2022-06-03 DIAGNOSIS — H5213 Myopia, bilateral: Secondary | ICD-10-CM | POA: Diagnosis not present

## 2022-06-03 NOTE — Telephone Encounter (Signed)
Patient picked up Ozempic sample - sample was labeled

## 2022-06-10 LAB — HM DIABETES EYE EXAM

## 2022-06-14 ENCOUNTER — Ambulatory Visit
Admission: EM | Admit: 2022-06-14 | Discharge: 2022-06-14 | Disposition: A | Discharge status: Home / Self Care | Payer: Medicaid Other | Attending: Physician Assistant | Admitting: Physician Assistant

## 2022-06-14 DIAGNOSIS — J069 Acute upper respiratory infection, unspecified: Secondary | ICD-10-CM | POA: Diagnosis not present

## 2022-06-14 MED ORDER — PREDNISONE 50 MG PO TABS: ORAL_TABLET | ORAL | 0 refills | Status: DC

## 2022-06-14 NOTE — ED Triage Notes (Signed)
Pt presents with ongoing productive cough, congestion, and headache X 1 week.

## 2022-06-20 NOTE — ED Provider Notes (Signed)
[facility]    CSN: [id number] Arrival date & time: 06/14/22  3300      History   Chief Complaint Chief Complaint  Patient presents with   URI   Headache    HPI [name] is a 41 y.o. female.   Patient complains of a cough congestion and headache for the past week.  Patient   URI Associated symptoms: headaches   Headache Associated symptoms: URI     Past Medical History:  Diagnosis Date   Abnormal Pap smear    f/u wnl   Asthma    Fibroid    Gestational diabetes    Hypertension    Morbid obesity ([place])    Sleep apnea    Urinary tract infection     Patient Active Problem List   Diagnosis Date Noted   Mixed hyperlipidemia 05/01/2022   Inappropriate sinus tachycardia 09/18/2021   Dyslipidemia 11/30/2020   Weight gain 11/30/2020   Class 3 severe obesity due to excess calories with serious comorbidity and body mass index (BMI) of 50.0 to 59.9 in adult ([place]) 05/19/2019   Anemia 12/21/2018   Uncontrolled type 2 diabetes mellitus with hyperglycemia ([place]) 12/21/2018   Anxiety 07/19/2018   Depression 07/19/2018   Essential hypertension 07/19/2018   Fibroid uterus 09/17/2015   Menorrhagia 08/27/2015   Type 2 diabetes mellitus without complication, with long-term current use of insulin ([place]) 09/13/2012   BMI 60.0-69.9, adult ([place]) 09/13/2012   Asthma 09/13/2012   History of high blood pressure 09/13/2012   OSA (obstructive sleep apnea) 12/10/2010    Past Surgical History:  Procedure Laterality Date   CESAREAN SECTION     DILATION AND CURETTAGE OF UTERUS     TUBAL LIGATION      OB History     Gravida  4   Para  2   Term  1   Preterm  1   AB  2   Living  3      SAB  2   IAB  0   Ectopic  0   Multiple  1   Live Births  3            Home Medications    Prior to Admission medications   Medication Sig Start Date End Date Taking? Authorizing Provider  predniSONE (DELTASONE) 50 MG tablet One tablet a day for 6 days  06/14/22  Yes Caryl Ada K, [name]  albuterol (PROVENTIL) (5 MG/ML) 0.5% nebulizer solution Take 0.5 mLs (2.5 mg total) by nebulization every 6 (six) hours as needed for wheezing or shortness of breath. 04/15/19   Zigmund Gottron, NP  albuterol (VENTOLIN HFA) 108 (90 Base) MCG/ACT inhaler Inhale 1-2 puffs into the lungs every 6 (six) hours as needed for wheezing or shortness of breath. 07/23/20   Wieters, Hallie C, [name]  atorvastatin (LIPITOR) 20 MG tablet TAKE 1 TABLET(20 MG) BY MOUTH DAILY 04/26/21   Minette Brine, FNP  blood glucose meter kit and supplies KIT Dispense based on patient and insurance preference. Use up to four times daily as directed. (FOR ICD-9 250.00, 250.01). 07/23/20   Wieters, Hallie C, [name]  Blood Glucose Monitoring Suppl (ACCU-CHEK GUIDE) w/Device KIT 1 Device by Does not apply route daily in the afternoon. 05/29/22   Shamleffer, Melanie Crazier, MD  cetirizine (ZYRTEC ALLERGY) 10 MG tablet Take 1 tablet (10 mg total) by mouth daily. 04/26/21   Bary Castilla, NP  cyclobenzaprine (FLEXERIL) 10 MG tablet Take 1 tablet (10 mg total)  by mouth 3 (three) times daily as needed for muscle spasms. 03/04/22   Fredia Sorrow, MD  dapagliflozin propanediol (FARXIGA) 10 MG TABS tablet Take 1 tablet (10 mg total) by mouth daily. 05/29/22   Shamleffer, Melanie Crazier, MD  diclofenac (VOLTAREN) 50 MG EC tablet Take 1 tablet (50 mg total) by mouth 3 (three) times daily. 07/11/19   Woodroe Mode, MD  ferrous sulfate 325 (65 FE) MG EC tablet Take 1 tablet (325 mg total) by mouth 3 (three) times daily with meals. 10/31/19 03/10/22  Rodriguez-Southworth, Sunday Spillers, [name]  ferrous sulfate 325 (65 FE) MG EC tablet Take 1 tablet (325 mg total) by mouth 2 (two) times daily. 10/10/21 10/10/22  Minette Brine, FNP  fluticasone (FLONASE) 50 MCG/ACT nasal spray Place 1-2 sprays into both nostrils daily. 04/18/21   Ghumman, Ramandeep, NP  glucose blood (ACCU-CHEK GUIDE) test strip 1 each by Other route daily in the  afternoon. Use as instructed 05/29/22   Shamleffer, Melanie Crazier, MD  HYDROcodone-acetaminophen (NORCO/VICODIN) 5-325 MG tablet Take 1 tablet by mouth every 6 (six) hours as needed for moderate pain. 03/04/22   Fredia Sorrow, MD  ibuprofen (ADVIL) 800 MG tablet Take 800 mg by mouth 3 (three) times daily. 07/11/20   [provider]  insulin degludec (TRESIBA FLEXTOUCH) 100 UNIT/ML FlexTouch Pen Inject 40 Units into the skin daily. 05/29/22   Shamleffer, Melanie Crazier, MD  Insulin Pen Needle 31G X 8 MM MISC 1 Device by Does not apply route daily. 11/30/20   Shamleffer, Melanie Crazier, MD  Lancets (ACCU-CHEK MULTICLIX) lancets USE TO CHECK BLOOD SUGARS UP TO 4 TIMES DAILY  DX CODE:E11.9 05/29/22   Shamleffer, Melanie Crazier, MD  levonorgestrel (MIRENA) 20 MCG/24HR IUD 1 each by Intrauterine route once.    [provider]  losartan-hydrochlorothiazide (HYZAAR) 50-12.5 MG tablet TAKE 1 TABLET BY MOUTH DAILY 05/30/22 08/28/22  Minette Brine, FNP  melatonin 5 MG TABS Take 1 tablet (5 mg total) by mouth at bedtime as needed. 04/28/22   Minette Brine, FNP  metFORMIN (GLUCOPHAGE-XR) 750 MG 24 hr tablet Take 2 tablets (1,500 mg total) by mouth daily with breakfast. 05/29/22   Shamleffer, Melanie Crazier, MD  propranolol (INDERAL) 10 MG tablet TAKE 1 TABLET(10 MG) BY MOUTH EVERY EVENING. No refills until pt is seen. 03/03/22   Minette Brine, FNP  Semaglutide, 2 MG/DOSE, (OZEMPIC, 2 MG/DOSE,) 8 MG/3ML SOPN 2 mg by Other route once a week. INJECT UNDER THE SKIN ONCE A WEEK 05/29/22   Shamleffer, Melanie Crazier, MD  triamcinolone (KENALOG) 0.025 % ointment Apply 1 application topically 2 (two) times daily. 04/18/21   Bary Castilla, NP  Vitamin D, Ergocalciferol, (DRISDOL) 1.25 MG (50000 UNIT) CAPS capsule Take 1 capsule by mouth on Tues and on Fridays. 04/23/21   Bary Castilla, NP    Family History Family History  Problem Relation Age of Onset   Diabetes Father    Allergies Mother     Asthma Mother    Hypertension Mother    Diabetes Mother    Diabetes Maternal Grandmother    Heart disease Maternal Grandmother    Anesthesia problems Neg Hx    Hypotension Neg Hx    Malignant hyperthermia Neg Hx    Pseudochol deficiency Neg Hx    Hearing loss Neg Hx     Social History Social History   Tobacco Use   Smoking status: Former    Types: Cigarettes    Quit date: 08/08/2019    Years since quitting: 2.8  Smokeless tobacco: Former  Scientific laboratory technician Use: Never used  Substance Use Topics   Alcohol use: No    Comment: occasional   Drug use: No     Allergies   Other, Penicillins, Bactrim [sulfamethoxazole-trimethoprim], and Fish allergy   Review of Systems Review of Systems  Neurological:  Positive for headaches.  All other systems reviewed and are negative.    Physical Exam Triage Vital Signs ED Triage Vitals  Enc Vitals Group     BP 06/14/22 0855 (!) 143/91     Pulse Rate 06/14/22 0855 94     Resp 06/14/22 0855 17     Temp 06/14/22 0855 98.1 F (36.7 C)     Temp Source 06/14/22 0855 Oral     SpO2 06/14/22 0855 96 %     Weight --      Height --      Head Circumference --      Peak Flow --      Pain Score 06/14/22 0857 6     Pain Loc --      Pain Edu? --      Excl. in [place]? --    No data found.  Updated Vital Signs BP (!) 143/91 (BP Location: Left Arm)   Pulse 94   Temp 98.1 F (36.7 C) (Oral)   Resp 17   SpO2 96%   Visual Acuity Right Eye Distance:   Left Eye Distance:   Bilateral Distance:    Right Eye Near:   Left Eye Near:    Bilateral Near:     Physical Exam Vitals and nursing note reviewed.  Constitutional:      Appearance: She is well-developed.  HENT:     Head: Normocephalic.  Pulmonary:     Effort: Pulmonary effort is normal.  Abdominal:     General: There is no distension.  Musculoskeletal:        General: Normal range of motion.     Cervical back: Normal range of motion.  Neurological:     Mental Status:  She is alert and oriented to person, place, and time.      UC Treatments / Results  Labs (all labs ordered are listed, but only abnormal results are displayed) Labs Reviewed - No data to display  EKG   Radiology No results found.  Procedures Procedures (including critical care time)  Medications Ordered in UC Medications - No data to display  Initial Impression / Assessment and Plan / UC Course  I have reviewed the triage vital signs and the nursing notes.  Pertinent labs & imaging results that were available during my care of the patient were reviewed by me and considered in my medical decision making (see chart for details).     DM: Patient counseled on symptoms I will treat her with prednisone patient is advised to follow-up with her physician for recheck Final Clinical Impressions(s) / UC Diagnoses   Final diagnoses:  Acute upper respiratory infection   Discharge Instructions   None    ED Prescriptions     Medication Sig Dispense Auth. Provider   predniSONE (DELTASONE) 50 MG tablet One tablet a day for 6 days 6 tablet Fransico Meadow, [place]      PDMP not reviewed this encounter. An After Visit Summary was printed and given to the patient.       Fransico Meadow, [place] 06/20/22 1244

## 2022-06-24 ENCOUNTER — Ambulatory Visit (INDEPENDENT_AMBULATORY_CARE_PROVIDER_SITE_OTHER): Payer: Medicaid Other | Admitting: Obstetrics and Gynecology

## 2022-06-24 ENCOUNTER — Other Ambulatory Visit (HOSPITAL_COMMUNITY)
Admission: RE | Admit: 2022-06-24 | Discharge: 2022-06-24 | Disposition: A | Discharge status: Home / Self Care | Payer: Medicaid Other | Source: Ambulatory Visit | Attending: Obstetrics and Gynecology | Admitting: Obstetrics and Gynecology

## 2022-06-24 VITALS — BP 131/91 | HR 97 | Ht 65.0 in | Wt 317.0 lb

## 2022-06-24 DIAGNOSIS — Z124 Encounter for screening for malignant neoplasm of cervix: Secondary | ICD-10-CM | POA: Insufficient documentation

## 2022-06-24 DIAGNOSIS — N926 Irregular menstruation, unspecified: Secondary | ICD-10-CM

## 2022-06-24 DIAGNOSIS — N898 Other specified noninflammatory disorders of vagina: Secondary | ICD-10-CM | POA: Diagnosis not present

## 2022-06-24 DIAGNOSIS — Z975 Presence of (intrauterine) contraceptive device: Secondary | ICD-10-CM | POA: Diagnosis not present

## 2022-06-24 MED ORDER — TERCONAZOLE 0.4 % VA CREA: 1.0000 | TOPICAL_CREAM | Freq: Every day | VAGINAL | 0 refills | Status: DC

## 2022-06-24 NOTE — Progress Notes (Signed)
  CC: breakthrough bleeding with IUD Subjective:    Patient ID: [name], female    DOB: 1981/01/02, 41 y.o.   MRN: [id number]  HPI 41 yo G4P3, c/s x 2 (hx of twin delivery), seen for report of a single menstrual cycle that was heavy with clots.  Pt had IUD placed 2021 for control of heavy bleeding.  Per pt it has been effective with only 1-2 days of very light bleeding usually.  Pt does note a hx of fibroids.  From 11/11-11/16 the patient had a heavier menses than usual with clots, and she was concerned.  Pt has not had any surveillance on the IUD since placement.  She was also due for a pap smear as well.   Review of Systems     Objective:   Physical Exam Vitals:   06/24/22 1635  BP: (!) 131/91  Pulse: 97   SSE: IUD strings easily seen, no portion of IUD shaft noted.       Assessment & Plan:   1. IUD (intrauterine device) in place IUD strings easily seen  2. Irregular menses Discussed expectant management versus pelvic ultrasound to better evaluate the IUD.  Pt desires expectant management for now since she only had the one heavy menses. Will follow up with virtual visit in 3 months unless she needs to be seen sooner   3. Cervical cancer screening  - Cytology - PAP( [place])    Griffin Basil, MD Faculty Attending, [facility]

## 2022-06-24 NOTE — Progress Notes (Signed)
Pt has had IUD since 2021 and recently had cycle with clots, usually has spotting.  Here today to make sure everything is ok IUD.

## 2022-06-25 ENCOUNTER — Other Ambulatory Visit (HOSPITAL_BASED_OUTPATIENT_CLINIC_OR_DEPARTMENT_OTHER): Payer: Self-pay | Admitting: Obstetrics and Gynecology

## 2022-06-25 DIAGNOSIS — Z1231 Encounter for screening mammogram for malignant neoplasm of breast: Secondary | ICD-10-CM

## 2022-06-27 LAB — CYTOLOGY - PAP
Comment: NEGATIVE
Diagnosis: NEGATIVE
High risk HPV: NEGATIVE

## 2022-07-08 ENCOUNTER — Ambulatory Visit (INDEPENDENT_AMBULATORY_CARE_PROVIDER_SITE_OTHER): Payer: Managed Care, Other (non HMO) | Admitting: Nurse Practitioner

## 2022-07-08 ENCOUNTER — Ambulatory Visit (HOSPITAL_BASED_OUTPATIENT_CLINIC_OR_DEPARTMENT_OTHER)
Admission: RE | Admit: 2022-07-08 | Discharge: 2022-07-08 | Disposition: A | Discharge status: Home / Self Care | Payer: Managed Care, Other (non HMO) | Source: Ambulatory Visit | Attending: Obstetrics and Gynecology | Admitting: Obstetrics and Gynecology

## 2022-07-08 ENCOUNTER — Encounter: Payer: Self-pay | Admitting: Nurse Practitioner

## 2022-07-08 VITALS — BP 126/88 | HR 112 | Temp 98.1°F | Ht 65.0 in | Wt 309.2 lb

## 2022-07-08 DIAGNOSIS — I10 Essential (primary) hypertension: Secondary | ICD-10-CM | POA: Diagnosis not present

## 2022-07-08 DIAGNOSIS — E782 Mixed hyperlipidemia: Secondary | ICD-10-CM | POA: Diagnosis not present

## 2022-07-08 DIAGNOSIS — Z6841 Body Mass Index (BMI) 40.0 and over, adult: Secondary | ICD-10-CM

## 2022-07-08 DIAGNOSIS — E669 Obesity, unspecified: Secondary | ICD-10-CM

## 2022-07-08 DIAGNOSIS — E66813 Obesity, class 3: Secondary | ICD-10-CM

## 2022-07-08 DIAGNOSIS — Z1231 Encounter for screening mammogram for malignant neoplasm of breast: Secondary | ICD-10-CM | POA: Insufficient documentation

## 2022-07-08 DIAGNOSIS — E1169 Type 2 diabetes mellitus with other specified complication: Secondary | ICD-10-CM

## 2022-07-08 MED ORDER — ATORVASTATIN CALCIUM 20 MG PO TABS: ORAL_TABLET | ORAL | 1 refills | Status: DC

## 2022-07-08 NOTE — Progress Notes (Signed)
I,Victoria T Hamilton,acting as a Education administrator for Minette Brine, FNP.,have documented all relevant documentation on the behalf of Minette Brine, FNP,as directed by  Minette Brine, FNP while in the presence of Minette Brine, [place].    Subjective:     Patient ID: [name] , female    DOB: 08/30/1980 , 41 y.o.   MRN: [id number]   Chief Complaint  Patient presents with   Hypertension    HPI  Pt presents today for bpc.  She adds using more of her inhaler, she went to urgent care for this problem at the beginning of November for cough. She states her cough has worsened, she was given prednisone.    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   Wt Readings from Last 3 Encounters: 07/08/22 : (!) 309 lb 3.2 oz (140.3 kg) 06/24/22 : (!) 317 lb (143.8 kg) 05/29/22 : (!) 314 lb (142.4 kg)       Past Medical History:  Diagnosis Date   Abnormal Pap smear    f/u wnl   Asthma    Fibroid    Gestational diabetes    Hypertension    Morbid obesity ([place])    Sleep apnea    Urinary tract infection      Family History  Problem Relation Age of Onset   Diabetes Father    Allergies Mother    Asthma Mother    Hypertension Mother    Diabetes Mother    Diabetes Maternal Grandmother    Heart disease Maternal Grandmother    Anesthesia problems Neg Hx    Hypotension Neg Hx    Malignant hyperthermia Neg Hx    Pseudochol deficiency Neg Hx    Hearing loss Neg Hx      Current Outpatient Medications:    albuterol (PROVENTIL) (5 MG/ML) 0.5% nebulizer solution, Take 0.5 mLs (2.5 mg total) by nebulization every 6 (six) hours as needed for wheezing or shortness of breath., Disp: 20 mL, Rfl: 12   albuterol (VENTOLIN HFA) 108 (90 Base) MCG/ACT inhaler, Inhale 1-2 puffs into the lungs every 6 (six) hours as needed for wheezing or shortness of breath., Disp: 18 g, Rfl: 0   blood glucose meter kit and supplies KIT, Dispense based on patient and insurance preference. Use up to four  times daily as directed. (FOR ICD-9 250.00, 250.01)., Disp: 1 each, Rfl: 0   Blood Glucose Monitoring Suppl (ACCU-CHEK GUIDE) w/Device KIT, 1 Device by Does not apply route daily in the afternoon., Disp: 1 kit, Rfl: 0   cetirizine (ZYRTEC ALLERGY) 10 MG tablet, Take 1 tablet (10 mg total) by mouth daily., Disp: 90 tablet, Rfl: 2   cyclobenzaprine (FLEXERIL) 10 MG tablet, Take 1 tablet (10 mg total) by mouth 3 (three) times daily as needed for muscle spasms., Disp: 20 tablet, Rfl: 0   dapagliflozin propanediol (FARXIGA) 10 MG TABS tablet, Take 1 tablet (10 mg total) by mouth daily., Disp: 90 tablet, Rfl: 3   diclofenac (VOLTAREN) 50 MG EC tablet, Take 1 tablet (50 mg total) by mouth 3 (three) times daily., Disp: 45 tablet, Rfl: 3   fluticasone (FLONASE) 50 MCG/ACT nasal spray, Place 1-2 sprays into both nostrils daily., Disp: 16 g, Rfl: 0   glucose blood (ACCU-CHEK GUIDE) test strip, 1 each by Other route daily in the afternoon. Use as instructed, Disp: 100 each, Rfl: 3   ibuprofen (ADVIL) 800 MG tablet, Take 800 mg by mouth 3 (three) times daily., Disp: ,  Rfl:    insulin degludec (TRESIBA FLEXTOUCH) 100 UNIT/ML FlexTouch Pen, Inject 40 Units into the skin daily., Disp: 45 mL, Rfl: 3   Insulin Pen Needle 31G X 8 MM MISC, 1 Device by Does not apply route daily., Disp: 100 each, Rfl: 1   Lancets (ACCU-CHEK MULTICLIX) lancets, USE TO CHECK BLOOD SUGARS UP TO 4 TIMES DAILY  DX CODE:E11.9, Disp: 100 each, Rfl: 12   levonorgestrel (MIRENA) 20 MCG/24HR IUD, 1 each by Intrauterine route once., Disp: , Rfl:    losartan-hydrochlorothiazide (HYZAAR) 50-12.5 MG tablet, TAKE 1 TABLET BY MOUTH DAILY, Disp: 90 tablet, Rfl: 1   melatonin 5 MG TABS, Take 1 tablet (5 mg total) by mouth at bedtime as needed., Disp: 90 tablet, Rfl: 1   metFORMIN (GLUCOPHAGE-XR) 750 MG 24 hr tablet, Take 2 tablets (1,500 mg total) by mouth daily with breakfast., Disp: 180 tablet, Rfl: 3   Multiple Vitamin (MULTIVITAMIN) capsule, Take 1  capsule by mouth daily., Disp: , Rfl:    propranolol (INDERAL) 10 MG tablet, TAKE 1 TABLET(10 MG) BY MOUTH EVERY EVENING. No refills until pt is seen., Disp: 30 tablet, Rfl: 0   Semaglutide, 2 MG/DOSE, (OZEMPIC, 2 MG/DOSE,) 8 MG/3ML SOPN, 2 mg by Other route once a week. INJECT UNDER THE SKIN ONCE A WEEK, Disp: 9 mL, Rfl: 3   terconazole (TERAZOL 7) 0.4 % vaginal cream, Place 1 applicator vaginally at bedtime., Disp: 45 g, Rfl: 0   triamcinolone (KENALOG) 0.025 % ointment, Apply 1 application topically 2 (two) times daily., Disp: 30 g, Rfl: 0   Vitamin D, Ergocalciferol, (DRISDOL) 1.25 MG (50000 UNIT) CAPS capsule, Take 1 capsule by mouth on Tues and on Fridays., Disp: 24 capsule, Rfl: 0   atorvastatin (LIPITOR) 20 MG tablet, TAKE 1 TABLET BY MOUTH EVERY DAY, Disp: 90 tablet, Rfl: 1   ferrous sulfate 325 (65 FE) MG EC tablet, Take 1 tablet (325 mg total) by mouth 3 (three) times daily with meals., Disp: 90 tablet, Rfl: 2   ferrous sulfate 325 (65 FE) MG EC tablet, Take 1 tablet (325 mg total) by mouth 2 (two) times daily. (Patient not taking: Reported on 06/24/2022), Disp: 60 tablet, Rfl: 3   HYDROcodone-acetaminophen (NORCO/VICODIN) 5-325 MG tablet, Take 1 tablet by mouth every 6 (six) hours as needed for moderate pain. (Patient not taking: Reported on 06/24/2022), Disp: 14 tablet, Rfl: 0   predniSONE (DELTASONE) 50 MG tablet, One tablet a day for 6 days (Patient not taking: Reported on 06/24/2022), Disp: 6 tablet, Rfl: 0   Allergies  Allergen Reactions   Other Anaphylaxis and Other (See Comments)    Pt has fish allergy Pt has allergy to butter cookies   Penicillins Rash and Other (See Comments)    Told MD that PCN reaction is rash and that she has taken amoxicillin before   Bactrim [Sulfamethoxazole-Trimethoprim]     Diarrhea, abdominal cramps and rash   Fish Allergy     Other reaction(s): Unknown     Review of Systems  Constitutional: Negative.   Respiratory: Negative.     Cardiovascular: Negative.   Neurological: Negative.   Psychiatric/Behavioral: Negative.       Today's Vitals   07/08/22 1457 07/08/22 1538  BP: (!) 136/96 126/88  Pulse: (!) 112   Temp: 98.1 F (36.7 C)   SpO2: 98%   Weight: (!) 309 lb 3.2 oz (140.3 kg)   Height: _0  (1.651 m)    Body mass index is 51.45 kg/m.  Wt Readings  from Last 3 Encounters:  07/08/22 (!) 309 lb 3.2 oz (140.3 kg)  06/24/22 (!) 317 lb (143.8 kg)  05/29/22 (!) 314 lb (142.4 kg)    Objective:  Physical Exam Vitals reviewed.  Constitutional:      General: She is not in acute distress.    Appearance: Normal appearance.  Cardiovascular:     Rate and Rhythm: Normal rate and regular rhythm.     Pulses: Normal pulses.     Heart sounds: Normal heart sounds. No murmur heard. Pulmonary:     Effort: Pulmonary effort is normal. No respiratory distress.     Breath sounds: Normal breath sounds. No wheezing.  Musculoskeletal:        General: No swelling or tenderness. Normal range of motion.  Skin:    General: Skin is warm and dry.  Neurological:     General: No focal deficit present.     Mental Status: She is alert and oriented to person, place, and time.     Cranial Nerves: No cranial nerve deficit.     Motor: No weakness.  Psychiatric:        Mood and Affect: Mood normal.        Behavior: Behavior normal.        Thought Content: Thought content normal.        Judgment: Judgment normal.         Assessment And Plan:     1. Essential hypertension Comments: Blood pressure is fairly controlled, encouraged to focus on life style changes.  2. Type 2 diabetes mellitus with obesity ([place]) Comments: Goal LDL is <70, pending results will consider increasing atorvastatin. Continue f/u with Endocrinology - BMP8+eGFR - Lipid panel  3. Mixed hyperlipidemia Comments: Will likely start her on statin, continue focusing on healthy diet. - Lipid panel  4. Class 3 severe obesity due to excess calories with  serious comorbidity and body mass index (BMI) of 50.0 to 59.9 in adult [facility]) Comments: Congratulated on her 8 lb weight loss.  She is encouraged to strive for BMI less than 30 to decrease cardiac risk. Advised to aim for at least 150 minutes of exercise per week.    Patient was given opportunity to ask questions. Patient verbalized understanding of the plan and was able to repeat key elements of the plan. All questions were answered to their satisfaction.  Minette Brine, FNP   I, Minette Brine, FNP, have reviewed all documentation for this visit. The documentation on 07/08/22 for the exam, diagnosis, procedures, and orders are all accurate and complete.   IF YOU HAVE BEEN REFERRED TO A SPECIALIST, IT MAY TAKE 1-2 WEEKS TO SCHEDULE/PROCESS THE REFERRAL. IF YOU HAVE NOT HEARD FROM US/SPECIALIST IN TWO WEEKS, PLEASE GIVE [place] A CALL AT [phone].   THE PATIENT IS ENCOURAGED TO PRACTICE SOCIAL DISTANCING DUE TO THE COVID-19 PANDEMIC.

## 2022-07-08 NOTE — Patient Instructions (Signed)
Hypertension, Adult ?Hypertension is another name for high blood pressure. High blood pressure forces your heart to work harder to pump blood. This can cause problems over time. ?There are two numbers in a blood pressure reading. There is a top number (systolic) over a bottom number (diastolic). It is best to have a blood pressure that is below 120/80. ?What are the causes? ?The cause of this condition is not known. Some other conditions can lead to high blood pressure. ?What increases the risk? ?Some lifestyle factors can make you more likely to develop high blood pressure: ?Smoking. ?Not getting enough exercise or physical activity. ?Being overweight. ?Having too much fat, sugar, calories, or salt (sodium) in your diet. ?Drinking too much alcohol. ?Other risk factors include: ?Having any of these conditions: ?Heart disease. ?Diabetes. ?High cholesterol. ?Kidney disease. ?Obstructive sleep apnea. ?Having a family history of high blood pressure and high cholesterol. ?Age. The risk increases with age. ?Stress. ?What are the signs or symptoms? ?High blood pressure may not cause symptoms. Very high blood pressure (hypertensive crisis) may cause: ?Headache. ?Fast or uneven heartbeats (palpitations). ?Shortness of breath. ?Nosebleed. ?Vomiting or feeling like you may vomit (nauseous). ?Changes in how you see. ?Very bad chest pain. ?Feeling dizzy. ?Seizures. ?How is this treated? ?This condition is treated by making healthy lifestyle changes, such as: ?Eating healthy foods. ?Exercising more. ?Drinking less alcohol. ?Your doctor may prescribe medicine if lifestyle changes do not help enough and if: ?Your top number is above 130. ?Your bottom number is above 80. ?Your personal target blood pressure may vary. ?Follow these instructions at home: ?Eating and drinking ? ?If told, follow the DASH eating plan. To follow this plan: ?Fill one half of your plate at each meal with fruits and vegetables. ?Fill one fourth of your plate  at each meal with whole grains. Whole grains include whole-wheat pasta, brown rice, and whole-grain bread. ?Eat or drink low-fat dairy products, such as skim milk or low-fat yogurt. ?Fill one fourth of your plate at each meal with low-fat (lean) proteins. Low-fat proteins include fish, chicken without skin, eggs, beans, and tofu. ?Avoid fatty meat, cured and processed meat, or chicken with skin. ?Avoid pre-made or processed food. ?Limit the amount of salt in your diet to less than 1,500 mg each day. ?Do not drink alcohol if: ?Your doctor tells you not to drink. ?You are pregnant, may be pregnant, or are planning to become pregnant. ?If you drink alcohol: ?Limit how much you have to: ?0-1 drink a day for women. ?0-2 drinks a day for men. ?Know how much alcohol is in your drink. In the [place] one drink equals one 12 oz bottle of beer (355 mL), one 5 oz glass of wine (148 mL), or one 1? oz glass of hard liquor (44 mL). ?Lifestyle ? ?Work with your doctor to stay at a healthy weight or to lose weight. Ask your doctor what the best weight is for you. ?Get at least 30 minutes of exercise that causes your heart to beat faster (aerobic exercise) most days of the week. This may include walking, swimming, or biking. ?Get at least 30 minutes of exercise that strengthens your muscles (resistance exercise) at least 3 days a week. This may include lifting weights or doing Pilates. ?Do not smoke or use any products that contain nicotine or tobacco. If you need help quitting, ask your doctor. ?Check your blood pressure at home as told by your doctor. ?Keep all follow-up visits. ?Medicines ?Take over-the-counter and prescription medicines   only as told by your doctor. Follow directions carefully. ?Do not skip doses of blood pressure medicine. The medicine does not work as well if you skip doses. Skipping doses also puts you at risk for problems. ?Ask your doctor about side effects or reactions to medicines that you should watch  for. ?Contact a doctor if: ?You think you are having a reaction to the medicine you are taking. ?You have headaches that keep coming back. ?You feel dizzy. ?You have swelling in your ankles. ?You have trouble with your vision. ?Get help right away if: ?You get a very bad headache. ?You start to feel mixed up (confused). ?You feel weak or numb. ?You feel faint. ?You have very bad pain in your: ?Chest. ?Belly (abdomen). ?You vomit more than once. ?You have trouble breathing. ?These symptoms may be an emergency. Get help right away. Call 911. ?Do not wait to see if the symptoms will go away. ?Do not drive yourself to the hospital. ?Summary ?Hypertension is another name for high blood pressure. ?High blood pressure forces your heart to work harder to pump blood. ?For most people, a normal blood pressure is less than 120/80. ?Making healthy choices can help lower blood pressure. If your blood pressure does not get lower with healthy choices, you may need to take medicine. ?This information is not intended to replace advice given to you by your health care provider. Make sure you discuss any questions you have with your health care provider. ?Document Revised: 05/09/2021 Document Reviewed: 05/09/2021 ?Elsevier Patient Education ? 2023 Elsevier Inc. ? ?

## 2022-07-09 LAB — BMP8+EGFR
BUN/Creatinine Ratio: 13 (ref 9–23)
BUN: 11 mg/dL (ref 6–24)
CO2: 20 mmol/L (ref 20–29)
Calcium: 10 mg/dL (ref 8.7–10.2)
Chloride: 100 mmol/L (ref 96–106)
Creatinine, Ser: 0.86 mg/dL (ref 0.57–1.00)
Glucose: 150 mg/dL — ABNORMAL HIGH (ref 70–99)
Potassium: 3.8 mmol/L (ref 3.5–5.2)
Sodium: 139 mmol/L (ref 134–144)
eGFR: 88 mL/min/{1.73_m2} (ref >59)

## 2022-07-09 LAB — LIPID PANEL
Chol/HDL Ratio: 4.5 ratio — ABNORMAL HIGH (ref 0.0–4.4)
Cholesterol, Total: 159 mg/dL (ref 100–199)
HDL: 35 mg/dL — ABNORMAL LOW (ref >39)
LDL Chol Calc (NIH): 89 mg/dL (ref 0–99)
Triglycerides: 206 mg/dL — ABNORMAL HIGH (ref 0–149)
VLDL Cholesterol Cal: 35 mg/dL (ref 5–40)

## 2022-07-10 ENCOUNTER — Other Ambulatory Visit: Payer: Self-pay | Admitting: Nurse Practitioner

## 2022-07-10 ENCOUNTER — Other Ambulatory Visit: Payer: Self-pay | Admitting: Obstetrics and Gynecology

## 2022-07-10 DIAGNOSIS — E1169 Type 2 diabetes mellitus with other specified complication: Secondary | ICD-10-CM

## 2022-07-10 DIAGNOSIS — E782 Mixed hyperlipidemia: Secondary | ICD-10-CM

## 2022-07-10 DIAGNOSIS — R928 Other abnormal and inconclusive findings on diagnostic imaging of breast: Secondary | ICD-10-CM

## 2022-07-15 ENCOUNTER — Ambulatory Visit
Admission: RE | Admit: 2022-07-15 | Discharge: 2022-07-15 | Disposition: A | Discharge status: Home / Self Care | Payer: Medicaid Other | Source: Ambulatory Visit | Attending: Obstetrics and Gynecology | Admitting: Obstetrics and Gynecology

## 2022-07-15 ENCOUNTER — Other Ambulatory Visit: Payer: Self-pay | Admitting: Obstetrics and Gynecology

## 2022-07-15 DIAGNOSIS — N6310 Unspecified lump in the right breast, unspecified quadrant: Secondary | ICD-10-CM

## 2022-07-15 DIAGNOSIS — R928 Other abnormal and inconclusive findings on diagnostic imaging of breast: Secondary | ICD-10-CM

## 2022-07-24 ENCOUNTER — Ambulatory Visit
Admission: RE | Admit: 2022-07-24 | Discharge: 2022-07-24 | Disposition: A | Discharge status: Home / Self Care | Payer: Medicaid Other | Source: Ambulatory Visit | Attending: Obstetrics and Gynecology | Admitting: Obstetrics and Gynecology

## 2022-07-24 DIAGNOSIS — N6310 Unspecified lump in the right breast, unspecified quadrant: Secondary | ICD-10-CM

## 2022-07-24 HISTORY — PX: BREAST BIOPSY: SHX20

## 2022-07-29 ENCOUNTER — Ambulatory Visit
Admission: EM | Admit: 2022-07-29 | Discharge: 2022-07-29 | Disposition: A | Discharge status: Home / Self Care | Payer: Medicaid Other | Attending: Family Medicine | Admitting: Family Medicine

## 2022-07-29 DIAGNOSIS — H66001 Acute suppurative otitis media without spontaneous rupture of ear drum, right ear: Secondary | ICD-10-CM

## 2022-07-29 DIAGNOSIS — J069 Acute upper respiratory infection, unspecified: Secondary | ICD-10-CM | POA: Diagnosis not present

## 2022-07-29 MED ORDER — FLUCONAZOLE 150 MG PO TABS: ORAL_TABLET | ORAL | 0 refills | Status: DC

## 2022-07-29 MED ORDER — PROMETHAZINE-DM 6.25-15 MG/5ML PO SYRP: 5.0000 mL | ORAL_SOLUTION | Freq: Four times a day (QID) | ORAL | 0 refills | Status: DC | PRN

## 2022-07-29 MED ORDER — CEFDINIR 300 MG PO CAPS: 300.0000 mg | ORAL_CAPSULE | Freq: Two times a day (BID) | ORAL | 0 refills | Status: DC

## 2022-07-29 NOTE — ED Provider Notes (Signed)
[place]   [id number] 07/29/22 Arrival Time: 2536  ASSESSMENT & PLAN:  1. Viral URI with cough   2. Non-recurrent acute suppurative otitis media of right ear without spontaneous rupture of tympanic membrane    Discussed typical duration of likely viral illness. OTC symptom care as needed.  New Prescriptions   CEFDINIR (OMNICEF) 300 MG CAPSULE    Take 1 capsule (300 mg total) by mouth 2 (two) times daily.   FLUCONAZOLE (DIFLUCAN) 150 MG TABLET    Take one tablet by mouth as a single dose. May repeat in 3 days if symptoms persist.   PROMETHAZINE-DEXTROMETHORPHAN (PROMETHAZINE-DM) 6.25-15 MG/5ML SYRUP    Take 5 mLs by mouth 4 (four) times daily as needed for cough.     Follow-up Information     Minette Brine, FNP.   Specialty: General Practice Why: If worsening or failing to improve as anticipated. Contact information: [address] [place] [address] [phone]                Fluconazole at pt request. Reviewed expectations re: course of current medical issues. Questions answered. Outlined signs and symptoms indicating need for more acute intervention. Understanding verbalized. After Visit Summary given.   SUBJECTIVE: History from: Patient. [name] is a 41 y.o. female. Reports: Pt [facility] with co of bilateral otalgia, dissiness, congestion and new onset of cough today. Pt reports 1 week of the otalgia and congestion. Pt also reports decreased smell as well. Pt reports otc sudafed, sinus medications, and flonase.    Pt reports she used her daughters antibiotic ear drops.  Normal PO intake without n/v/d.  OBJECTIVE:  Vitals:   07/29/22 1934  BP: 119/80  Pulse: (!) 104  Resp: 19  Temp: 99 F (37.2 C)  TempSrc: Oral  SpO2: 98%    General appearance: alert; no distress Eyes: PERRLA; EOMI; conjunctiva normal HENT: [place]; AT; with nasal congestion; R TM with erythema and bulging Neck: supple  Lungs: speaks full  sentences without difficulty; unlabored Extremities: no edema Skin: warm and dry Neurologic: normal gait Psychological: alert and cooperative; normal mood and affect  Labs: Results for orders placed or performed in visit on 07/08/22  [facility]  Result Value Ref Range   Glucose 150 (H) 70 - 99 mg/dL   BUN 11 6 - 24 mg/dL   Creatinine, Ser 0.86 0.57 - 1.00 mg/dL   eGFR 88 >59 mL/min/1.73   BUN/Creatinine Ratio 13 9 - 23   Sodium 139 134 - 144 mmol/L   Potassium 3.8 3.5 - 5.2 mmol/L   Chloride 100 96 - 106 mmol/L   CO2 20 20 - 29 mmol/L   Calcium 10.0 8.7 - 10.2 mg/dL  Lipid panel  Result Value Ref Range   Cholesterol, Total 159 100 - 199 mg/dL   Triglycerides 206 (H) 0 - 149 mg/dL   HDL 35 (L) >39 mg/dL   VLDL Cholesterol Cal 35 5 - 40 mg/dL   LDL Chol Calc (NIH) 89 0 - 99 mg/dL   Chol/HDL Ratio 4.5 (H) 0.0 - 4.4 ratio   Labs Reviewed - No data to display  Imaging: No results found.  Allergies  Allergen Reactions   Other Anaphylaxis and Other (See Comments)    Pt has fish allergy Pt has allergy to butter cookies   Penicillins Rash and Other (See Comments)    Told MD that PCN reaction is rash and that she has taken amoxicillin before   Bactrim [Sulfamethoxazole-Trimethoprim]  Diarrhea, abdominal cramps and rash   Fish Allergy     Other reaction(s): Unknown    Past Medical History:  Diagnosis Date   Abnormal Pap smear    f/u wnl   Asthma    Fibroid    Gestational diabetes    Hypertension    Morbid obesity ([place])    Sleep apnea    Urinary tract infection    Social History   Socioeconomic History   Marital status: Legally Separated    Spouse name: Married   Number of children: N   Years of education: Not on file   Highest education level: Not on file  Occupational History   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    Comment: BTL  Other Topics Concern   Not on file  Social History Narrative   Currently [redacted] weeks pregnant with first child (boy) Due 04/16/11         Social Determinants of Health   Financial Resource Strain: High Risk (08/30/2020)   Overall Financial Resource Strain (CARDIA)    Difficulty of Paying Living Expenses: Hard  Food Insecurity: No Food Insecurity (03/31/2019)   Hunger Vital Sign    Worried About Running Out of Food in the Last Year: Never true    Ran Out of Food in the Last Year: Never true  Transportation Needs: No Transportation Needs (03/31/2019)   PRAPARE - Hydrologist (Medical): No    Lack of Transportation (Non-Medical): No  Physical Activity: Inactive (03/31/2019)   Exercise Vital Sign    Days of Exercise per Week: 0 days    Minutes of Exercise per Session: 0 min  Stress: Stress Concern Present (03/31/2019)   [place]    Feeling of Stress : To some extent  Social Connections: Not on file  Intimate Partner Violence: Not on file   Family History  Problem Relation Age of Onset   Diabetes Father    Allergies Mother    Asthma Mother    Hypertension Mother    Diabetes Mother    Diabetes Maternal Grandmother    Heart disease Maternal Grandmother    Anesthesia problems Neg Hx    Hypotension Neg Hx    Malignant hyperthermia Neg Hx    Pseudochol deficiency Neg Hx    Hearing loss Neg Hx    Past Surgical History:  Procedure Laterality Date   BREAST BIOPSY Right 07/24/2022   [place] RT BREAST BX W LOC DEV 1ST LESION IMG BX SPEC US GUIDE 07/24/2022 [facility]   CESAREAN SECTION     DILATION AND CURETTAGE OF UTERUS      TUBAL LIGATION       Vanessa Kick, MD 07/29/22 2011

## 2022-07-29 NOTE — ED Triage Notes (Signed)
Pt presents to [facility] with co of bilateral otalgia, dissiness, congestion and new onset of cough today. Pt reports 1 week of the otalgia and congestion. Pt also reports decreased smell as well. Pt reports otc sudafed, sinus medications, and flonase.    Pt reports she used her daughters antibiotic ear drops.

## 2022-08-05 ENCOUNTER — Encounter: Payer: Self-pay | Admitting: Podiatry

## 2022-08-05 ENCOUNTER — Ambulatory Visit (INDEPENDENT_AMBULATORY_CARE_PROVIDER_SITE_OTHER): Payer: Medicaid Other | Admitting: Podiatry

## 2022-08-05 DIAGNOSIS — Z79899 Other long term (current) drug therapy: Secondary | ICD-10-CM

## 2022-08-05 DIAGNOSIS — L84 Corns and callosities: Secondary | ICD-10-CM | POA: Diagnosis not present

## 2022-08-05 DIAGNOSIS — B351 Tinea unguium: Secondary | ICD-10-CM

## 2022-08-05 DIAGNOSIS — A498 Other bacterial infections of unspecified site: Secondary | ICD-10-CM | POA: Insufficient documentation

## 2022-08-05 NOTE — Patient Instructions (Signed)
Terbinafine Tablets What is this medication? TERBINAFINE (TER bin a feen) treats fungal infections of the nails. It belongs to a group of medications called antifungals. It will not treat infections caused by bacteria or viruses. This medicine may be used for other purposes; ask your health care provider or pharmacist if you have questions. COMMON BRAND NAME(S): Lamisil, Terbinex What should I tell my care team before I take this medication? They need to know if you have any of these conditions: Liver disease An unusual or allergic reaction to terbinafine, other medications, foods, dyes, or preservatives Pregnant or trying to get pregnant Breast-feeding How should I use this medication? Take this medication by mouth with water. Take it as directed on the prescription label at the same time every day. You can take it with or without food. If it upsets your stomach, take it with food. Keep taking it unless your care team tells you to stop. A special MedGuide will be given to you by the pharmacist with each prescription and refill. Be sure to read this information carefully each time. Talk to your care team regarding the use of this medication in children. Special care may be needed. Overdosage: If you think you have taken too much of this medicine contact a poison control center or emergency room at once. NOTE: This medicine is only for you. Do not share this medicine with others. What if I miss a dose? If you miss a dose, take it as soon as you can unless it is more than 4 hours late. If it is more than 4 hours late, skip the missed dose. Take the next dose at the normal time. What may interact with this medication? Do not take this medication with any of the following: Pimozide Thioridazine This medication may also interact with the following: Beta blockers Caffeine Certain medications for mental health conditions Cimetidine Cyclosporine Medications for fungal infections like fluconazole  and ketoconazole Medications for irregular heartbeat like amiodarone, flecainide and propafenone Rifampin Warfarin This list may not describe all possible interactions. Give your health care provider a list of all the medicines, herbs, non-prescription drugs, or dietary supplements you use. Also tell them if you smoke, drink alcohol, or use illegal drugs. Some items may interact with your medicine. What should I watch for while using this medication? Visit your care team for regular checks on your progress. You may need blood work while you are taking this medication. It may be some time before you see the benefit from this medication. This medication may cause serious skin reactions. They can happen weeks to months after starting the medication. Contact your care team right away if you notice fevers or flu-like symptoms with a rash. The rash may be red or purple and then turn into blisters or peeling of the skin. Or, you might notice a red rash with swelling of the face, lips or lymph nodes in your neck or under your arms. This medication can make you more sensitive to the sun. Keep out of the sun, If you cannot avoid being in the sun, wear protective clothing and sunscreen. Do not use sun lamps or tanning beds/booths. What side effects may I notice from receiving this medication? Side effects that you should report to your care team as soon as possible: Allergic reactions--skin rash, itching, hives, swelling of the face, lips, tongue, or throat Change in sense of smell Change in taste Infection--fever, chills, cough, or sore throat Liver injury--right upper belly pain, loss of appetite, nausea,   light-colored stool, dark yellow or brown urine, yellowing skin or eyes, unusual weakness or fatigue Low red blood cell level--unusual weakness or fatigue, dizziness, headache, trouble breathing Lupus-like syndrome--joint pain, swelling, or stiffness, butterfly-shaped rash on the face, rashes that get worse  in the sun, fever, unusual weakness or fatigue Rash, fever, and swollen lymph nodes Redness, blistering, peeling, or loosening of the skin, including inside the mouth Unusual bruising or bleeding Worsening mood, feelings of depression Side effects that usually do not require medical attention (report to your care team if they continue or are bothersome): Diarrhea Gas Headache Nausea Stomach pain Upset stomach This list may not describe all possible side effects. Call your doctor for medical advice about side effects. You may report side effects to FDA at 1-800-FDA-1088. Where should I keep my medication? Keep out of the reach of children and pets. Store between 20 and 25 degrees C (68 and 77 degrees F). Protect from light. Get rid of any unused medication after the expiration date. To get rid of medications that are no longer needed or have expired: Take the medication to a medication take-back program. Check with your pharmacy or law enforcement to find a location. If you cannot return the medication, check the label or package insert to see if the medication should be thrown out in the garbage or flushed down the toilet. If you are not sure, ask your care team. If it is safe to put it in the trash, take the medication out of the container. Mix the medication with cat litter, dirt, coffee grounds, or other unwanted substance. Seal the mixture in a bag or container. Put it in the trash. NOTE: This sheet is a summary. It may not cover all possible information. If you have questions about this medicine, talk to your doctor, pharmacist, or health care provider.  2023 Elsevier/Gold Standard (2021-02-12 00:00:00)  

## 2022-08-09 NOTE — Progress Notes (Signed)
Subjective: Chief Complaint  Patient presents with   Callouses    Trim calluses hallux bilateral (L>R)     42 year old female presents the office today for calluses to both of her feet as well as her toes becoming thicker.  No drainage or pus to the toenail sites of the calluses.  No open lesions.   Objective: AAO x3, NAD DP/PT pulses palpable bilaterally, CRT less than 3 seconds Flatfoot present.  Hyperkeratotic lesion medial hallux IPJ bilaterally left side worse than right but there is no ongoing ulceration drainage or signs of infection.  Mild callus noted the distal aspect of the left second toe without any underlying ulceration drainage or signs of infection.  No open lesions. Nails appear to be hypertrophic, dystrophic mostly the left first and second toes.  Difficult to fully evaluate within the polish.  There is no extensively hyperpigmentation of the surrounding skin.  No pain in the nails.  No edema, erythema.  No pain with calf compression, swelling, warmth, erythema  Assessment: Preulcerative calluses, onychomycosis Plan: -All treatment options discussed with the patient including all alternatives, risks, complications.  -Sharply debrided the hyperkeratotic lesion hallux without any complications or bleeding as a courtesy we again discussed surgical intervention that her A1c is coming down.  She has not been continued with moisturizer, offloading. -Discussed treatment options for nail fungus.  We discussed oral, topical she was to proceed with oral Lamisil.  Will check a CBC and LFT prior to starting medication.  Discussed side effects of medication as well as success rates.  Trula Slade DPM

## 2022-08-12 NOTE — Telephone Encounter (Signed)
Chmg-error.  

## 2022-08-28 ENCOUNTER — Ambulatory Visit: Payer: Medicaid Other | Admitting: Nurse Practitioner

## 2022-09-01 ENCOUNTER — Other Ambulatory Visit (HOSPITAL_COMMUNITY): Payer: Self-pay

## 2022-09-03 ENCOUNTER — Other Ambulatory Visit (HOSPITAL_COMMUNITY): Payer: Self-pay

## 2022-09-05 ENCOUNTER — Other Ambulatory Visit (HOSPITAL_COMMUNITY): Payer: Self-pay

## 2022-09-11 ENCOUNTER — Telehealth: Payer: Self-pay | Admitting: Pharmacy Technician

## 2022-09-11 ENCOUNTER — Other Ambulatory Visit (HOSPITAL_COMMUNITY): Payer: Self-pay

## 2022-09-11 NOTE — Telephone Encounter (Signed)
Pharmacy Patient Advocate Encounter   Received notification from [facility] that prior authorization for Ozempic '2mg'$  is required/requested.   PA submitted on 09/11/22 to (ins) [facility] via Prompt PA EOCID: [id number] Status is pending

## 2022-09-23 ENCOUNTER — Other Ambulatory Visit: Payer: Self-pay

## 2022-09-23 DIAGNOSIS — E119 Type 2 diabetes mellitus without complications: Secondary | ICD-10-CM

## 2022-11-03 ENCOUNTER — Ambulatory Visit: Payer: Managed Care, Other (non HMO) | Admitting: Podiatry

## 2022-11-04 ENCOUNTER — Ambulatory Visit: Payer: Medicaid Other | Admitting: Podiatry

## 2022-12-03 ENCOUNTER — Ambulatory Visit: Payer: Medicaid Other | Admitting: Internal Medicine

## 2023-01-03 ENCOUNTER — Other Ambulatory Visit: Payer: Self-pay | Admitting: Nurse Practitioner

## 2023-01-03 DIAGNOSIS — E1169 Type 2 diabetes mellitus with other specified complication: Secondary | ICD-10-CM

## 2023-01-03 DIAGNOSIS — E782 Mixed hyperlipidemia: Secondary | ICD-10-CM

## 2023-01-09 ENCOUNTER — Encounter: Payer: Self-pay | Admitting: Internal Medicine

## 2023-01-09 ENCOUNTER — Telehealth (INDEPENDENT_AMBULATORY_CARE_PROVIDER_SITE_OTHER): Payer: Medicaid Other | Admitting: Internal Medicine

## 2023-01-09 DIAGNOSIS — Z794 Long term (current) use of insulin: Secondary | ICD-10-CM | POA: Diagnosis not present

## 2023-01-09 DIAGNOSIS — E119 Type 2 diabetes mellitus without complications: Secondary | ICD-10-CM | POA: Diagnosis not present

## 2023-01-09 MED ORDER — TRESIBA FLEXTOUCH 100 UNIT/ML ~~LOC~~ SOPN: 40.0000 [IU] | PEN_INJECTOR | Freq: Every day | SUBCUTANEOUS | 3 refills | Status: DC

## 2023-01-09 MED ORDER — OZEMPIC (2 MG/DOSE) 8 MG/3ML ~~LOC~~ SOPN: 2.0000 mg | PEN_INJECTOR | SUBCUTANEOUS | 3 refills | Status: DC

## 2023-01-09 MED ORDER — METFORMIN HCL ER 750 MG PO TB24: 1500.0000 mg | ORAL_TABLET | Freq: Every day | ORAL | 3 refills | Status: DC

## 2023-01-09 MED ORDER — DAPAGLIFLOZIN PROPANEDIOL 10 MG PO TABS: 10.0000 mg | ORAL_TABLET | Freq: Every day | ORAL | 3 refills | Status: DC

## 2023-01-09 MED ORDER — INSULIN PEN NEEDLE 31G X 8 MM MISC: 1.0000 | Freq: Every day | 1 refills | Status: DC

## 2023-01-09 NOTE — Progress Notes (Signed)
Virtual Visit via Video Note  I connected with [name] on 01/09/23  at 9:30 AM by a video enabled telemedicine application and verified that I am speaking with the correct person using two identifiers.   I discussed the limitations of evaluation and management by telemedicine and the availability of in person appointments. The patient expressed understanding and agreed to proceed.   -Location of the patient : work -Location of the provider : Office -The names of all persons participating in the telemedicine service : Pt and myself       Name: [name]  MRN/ DOB: [id number], 1981-02-17   Age/ Sex: 42 y.o., female    PCP: Arnette Felts, FNP   Reason for Endocrinology Evaluation: Type 2 Diabetes Mellitus     Date of Initial Endocrinology Visit: 11/30/2020    PATIENT IDENTIFIER: [name] is a 42 y.o. female with a past medical history of T2DM, OSA, Dyslipidemia and HTN. The patient presented for initial endocrinology clinic visit on 11/30/2020 for consultative assistance with her diabetes management.    HPI: [name] was    Diagnosed with gestational diabetes in 2012  And never resolved after delivery Hemoglobin A1c has ranged from 7.1% in 2021, peaking at 11.1 in 2022.  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  She was lost to follow-up for approximately 18 months before her return to our clinic again.  SUBJECTIVE:   During the last visit (05/29/2022): A1c 6.2%  Today (01/09/23): [name] is here for follow-up on diabetes management.  She checks her blood sugars occasionally at night.  The patient has not had hypoglycemic episodes since the last clinic visit.  She is compliant with medication intake She denies any nausea or vomiting Denies constipation or diarrhea Denies recent UTIs    HOME DIABETES  REGIMEN: Metformin 750 mg, 2 tabs  daily Farxiga 10 mg daily Ozempic 2 mg weekly (Mondays) Tresiba 40 units daily       Statin: yes ACE-I/ARB: yes Prior Diabetic Education: yes   METER DOWNLOAD SUMMARY: Did not bring    DIABETIC COMPLICATIONS: Microvascular complications:  Denies: CKD,  Neuropathy, retinopathy  Last eye exam: Completed 2021  Macrovascular complications:   Denies: CAD, PVD, CVA   PAST HISTORY: Past Medical History:  Past Medical History:  Diagnosis Date   Abnormal Pap smear    f/u wnl   Asthma    Fibroid    Gestational diabetes    Hypertension    Morbid obesity (HCC)    Sleep apnea    Urinary tract infection    Past Surgical History:  Past Surgical History:  Procedure Laterality Date   BREAST BIOPSY Right 07/24/2022   [place] RT BREAST BX W LOC DEV 1ST LESION IMG BX SPEC US GUIDE 07/24/2022 [facility]   CESAREAN SECTION     DILATION AND CURETTAGE OF UTERUS     TUBAL LIGATION      Social History:  reports that she quit smoking about 3 years ago. Her smoking use included cigarettes. She has quit using smokeless tobacco. She reports that she does not drink alcohol and does not use drugs. Family History:  Family History  Problem Relation Age of Onset   Diabetes Father    Allergies Mother    Asthma Mother    Hypertension Mother  Diabetes Mother    Diabetes Maternal Grandmother    Heart disease Maternal Grandmother    Anesthesia problems Neg Hx    Hypotension Neg Hx    Malignant hyperthermia Neg Hx    Pseudochol deficiency Neg Hx    Hearing loss Neg Hx      HOME MEDICATIONS: Allergies as of 01/09/2023       Reactions   Other Anaphylaxis, Other (See Comments)   Pt has fish allergy Pt has allergy to butter cookies   Penicillins Rash, Other (See Comments)   Told MD that PCN reaction is rash and that she has taken amoxicillin before   Bactrim [sulfamethoxazole-trimethoprim]    Diarrhea, abdominal cramps and rash   Fish Allergy     Other reaction(s): Unknown        Medication List        Accurate as of January 09, 2023  9:35 AM. If you have any questions, ask your nurse or doctor.          Accu-Chek Guide test strip Generic drug: glucose blood 1 each by Other route daily in the afternoon. Use as instructed   Accu-Chek Guide w/Device Kit 1 Device by Does not apply route daily in the afternoon.   accu-chek multiclix lancets USE TO CHECK BLOOD SUGARS UP TO 4 TIMES DAILY  DX CODE:E11.9   albuterol (5 MG/ML) 0.5% nebulizer solution Commonly known as: PROVENTIL Take 0.5 mLs (2.5 mg total) by nebulization every 6 (six) hours as needed for wheezing or shortness of breath.   albuterol 108 (90 Base) MCG/ACT inhaler Commonly known as: VENTOLIN HFA Inhale 1-2 puffs into the lungs every 6 (six) hours as needed for wheezing or shortness of breath.   atorvastatin 20 MG tablet Commonly known as: LIPITOR TAKE 1 TABLET BY MOUTH EVERY DAY   blood glucose meter kit and supplies Kit Dispense based on patient and insurance preference. Use up to four times daily as directed. (FOR ICD-9 250.00, 250.01).   cefdinir 300 MG capsule Commonly known as: OMNICEF Take 1 capsule (300 mg total) by mouth 2 (two) times daily.   cetirizine 10 MG tablet Commonly known as: ZyrTEC Allergy Take 1 tablet (10 mg total) by mouth daily.   cyclobenzaprine 10 MG tablet Commonly known as: FLEXERIL Take 1 tablet (10 mg total) by mouth 3 (three) times daily as needed for muscle spasms.   dapagliflozin propanediol 10 MG Tabs tablet Commonly known as: Farxiga Take 1 tablet (10 mg total) by mouth daily.   diclofenac 50 MG EC tablet Commonly known as: VOLTAREN Take 1 tablet (50 mg total) by mouth 3 (three) times daily.   ferrous sulfate 325 (65 FE) MG EC tablet Take 1 tablet (325 mg total) by mouth 3 (three) times daily with meals.   fluconazole 150 MG tablet Commonly known as: Diflucan Take one tablet by mouth as a single dose. May  repeat in 3 days if symptoms persist.   fluticasone 50 MCG/ACT nasal spray Commonly known as: FLONASE Place 1-2 sprays into both nostrils daily.   ibuprofen 800 MG tablet Commonly known as: ADVIL Take 800 mg by mouth 3 (three) times daily.   Insulin Pen Needle 31G X 8 MM Misc 1 Device by Does not apply route daily.   levonorgestrel 20 MCG/24HR IUD Commonly known as: MIRENA 1 each by Intrauterine route once.   losartan-hydrochlorothiazide 50-12.5 MG tablet Commonly known as: HYZAAR TAKE 1 TABLET BY MOUTH DAILY   melatonin 5 MG Tabs Take 1 tablet (  5 mg total) by mouth at bedtime as needed.   metFORMIN 750 MG 24 hr tablet Commonly known as: GLUCOPHAGE-XR Take 2 tablets (1,500 mg total) by mouth daily with breakfast.   multivitamin capsule Take 1 capsule by mouth daily.   Ozempic (2 MG/DOSE) 8 MG/3ML Sopn Generic drug: Semaglutide (2 MG/DOSE) 2 mg by Other route once a week. INJECT UNDER THE SKIN ONCE A WEEK   promethazine-dextromethorphan 6.25-15 MG/5ML syrup Commonly known as: PROMETHAZINE-DM Take 5 mLs by mouth 4 (four) times daily as needed for cough.   propranolol 10 MG tablet Commonly known as: INDERAL TAKE 1 TABLET(10 MG) BY MOUTH EVERY EVENING. No refills until pt is seen.   terconazole 0.4 % vaginal cream Commonly known as: TERAZOL 7 Place 1 applicator vaginally at bedtime.   Evaristo Bury FlexTouch 100 UNIT/ML FlexTouch Pen Generic drug: insulin degludec Inject 40 Units into the skin daily.   triamcinolone 0.025 % ointment Commonly known as: KENALOG Apply 1 application topically 2 (two) times daily.   Vitamin D (Ergocalciferol) 1.25 MG (50000 UNIT) Caps capsule Commonly known as: DRISDOL Take 1 capsule by mouth on Tues and on Fridays.         ALLERGIES: Allergies  Allergen Reactions   Other Anaphylaxis and Other (See Comments)    Pt has fish allergy Pt has allergy to butter cookies   Penicillins Rash and Other (See Comments)    Told MD that PCN  reaction is rash and that she has taken amoxicillin before   Bactrim [Sulfamethoxazole-Trimethoprim]     Diarrhea, abdominal cramps and rash   Fish Allergy     Other reaction(s): Unknown        OBJECTIVE:   VITAL SIGNS: There were no vitals taken for this visit.   PHYSICAL EXAM:  General: Pt appears well and is in NAD    DM foot exam: per podiatry 05/02/2022     DATA REVIEWED:  Lab Results  Component Value Date   HGBA1C 6.2 (A) 05/29/2022   HGBA1C 6.9 (H) 03/10/2022   HGBA1C 8.2 (H) 08/21/2021   Lab Results  Component Value Date   MICROALBUR 30 04/18/2021   LDLCALC 89 07/08/2022   CREATININE 0.86 07/08/2022   Lab Results  Component Value Date   MICRALBCREAT <30 04/18/2021    Lab Results  Component Value Date   CHOL 159 07/08/2022   HDL 35 (L) 07/08/2022   LDLCALC 89 07/08/2022   TRIG 206 (H) 07/08/2022   CHOLHDL 4.5 (H) 07/08/2022        ASSESSMENT / PLAN / RECOMMENDATIONS:   1) Type 2 Diabetes Mellitus, OPtimally  controlled, Without complications - Most recent A1c of 6.2 %. Goal A1c < 7.0 %.    -Her nighttime BG's have been optimal between 140-150, unable to check fasting due to busy schedule in the mornings -Tolerating medications without side effects -No changes     MEDICATIONS: -Continue Tresiba to 40 units daily  -Continue Farxiga 10 mg, 1 tablet in the morning  -Continue Metformin 750 mg, 1 tablet twice daily  -Continue Ozempic 2 mg weekly    EDUCATION / INSTRUCTIONS: BG monitoring instructions: Patient is instructed to check her blood sugars 1 times a day, fasting. Call [place] [facility] if: BG persistently < 70  I reviewed the Rule of 15 for the treatment of hypoglycemia in detail with the patient. Literature supplied.   2) Diabetic complications:  Eye: Does not have known diabetic retinopathy.  Neuro/ Feet: Does not have known diabetic peripheral  neuropathy. Renal: Patient does not have known baseline CKD. She is on an  ACEI/ARB at present.      F/U in 6 months     Signed electronically by: Lyndle Herrlich, MD  [facility] Endocrinology  [facility] [address] [place], [place] [address] Phone: [phone] FAX: [phone]   CC: Arnette Felts, FNP [address] [place] [address] Phone: [phone]  Fax: [phone]    Return to Endocrinology clinic as below: Future Appointments  Date Time Provider Department Center  05/04/2023 10:00 AM Arnette Felts, FNP [facility] None

## 2023-01-15 ENCOUNTER — Emergency Department (HOSPITAL_BASED_OUTPATIENT_CLINIC_OR_DEPARTMENT_OTHER)
Admission: EM | Admit: 2023-01-15 | Discharge: 2023-01-15 | Disposition: A | Discharge status: Home / Self Care | Payer: Medicaid Other | Attending: Emergency Medicine | Admitting: Emergency Medicine

## 2023-01-15 ENCOUNTER — Other Ambulatory Visit: Payer: Self-pay

## 2023-01-15 ENCOUNTER — Encounter (HOSPITAL_BASED_OUTPATIENT_CLINIC_OR_DEPARTMENT_OTHER): Payer: Self-pay | Admitting: Emergency Medicine

## 2023-01-15 ENCOUNTER — Other Ambulatory Visit (HOSPITAL_BASED_OUTPATIENT_CLINIC_OR_DEPARTMENT_OTHER): Payer: Self-pay

## 2023-01-15 DIAGNOSIS — Z7984 Long term (current) use of oral hypoglycemic drugs: Secondary | ICD-10-CM | POA: Diagnosis not present

## 2023-01-15 DIAGNOSIS — E119 Type 2 diabetes mellitus without complications: Secondary | ICD-10-CM | POA: Insufficient documentation

## 2023-01-15 DIAGNOSIS — R42 Dizziness and giddiness: Secondary | ICD-10-CM | POA: Diagnosis present

## 2023-01-15 DIAGNOSIS — Z794 Long term (current) use of insulin: Secondary | ICD-10-CM | POA: Diagnosis not present

## 2023-01-15 DIAGNOSIS — F439 Reaction to severe stress, unspecified: Secondary | ICD-10-CM | POA: Diagnosis not present

## 2023-01-15 DIAGNOSIS — I10 Essential (primary) hypertension: Secondary | ICD-10-CM | POA: Diagnosis not present

## 2023-01-15 DIAGNOSIS — Z79899 Other long term (current) drug therapy: Secondary | ICD-10-CM | POA: Diagnosis not present

## 2023-01-15 DIAGNOSIS — G479 Sleep disorder, unspecified: Secondary | ICD-10-CM | POA: Diagnosis not present

## 2023-01-15 DIAGNOSIS — R030 Elevated blood-pressure reading, without diagnosis of hypertension: Secondary | ICD-10-CM

## 2023-01-15 LAB — CBC
HCT: 42 % (ref 36.0–46.0)
Hemoglobin: 13.6 g/dL (ref 12.0–15.0)
MCH: 27.5 pg (ref 26.0–34.0)
MCHC: 32.4 g/dL (ref 30.0–36.0)
MCV: 85 fL (ref 80.0–100.0)
Platelets: 339 10*3/uL (ref 150–400)
RBC: 4.94 MIL/uL (ref 3.87–5.11)
RDW: 13.9 % (ref 11.5–15.5)
WBC: 6 10*3/uL (ref 4.0–10.5)
nRBC: 0 % (ref 0.0–0.2)

## 2023-01-15 LAB — BASIC METABOLIC PANEL
Anion gap: 8 (ref 5–15)
BUN: 7 mg/dL (ref 6–20)
CO2: 23 mmol/L (ref 22–32)
Calcium: 8.8 mg/dL — ABNORMAL LOW (ref 8.9–10.3)
Chloride: 100 mmol/L (ref 98–111)
Creatinine, Ser: 0.8 mg/dL (ref 0.44–1.00)
GFR, Estimated: >60 mL/min (ref >60)
Glucose, Bld: 144 mg/dL — ABNORMAL HIGH (ref 70–99)
Potassium: 3.7 mmol/L (ref 3.5–5.1)
Sodium: 131 mmol/L — ABNORMAL LOW (ref 135–145)

## 2023-01-15 LAB — HCG, QUANTITATIVE, PREGNANCY: hCG, Beta Chain, Quant, S: <1 m[IU]/mL (ref <5)

## 2023-01-15 MED ORDER — HYDROXYZINE HCL 25 MG PO TABS
25.0000 mg | ORAL_TABLET | Freq: Four times a day (QID) | ORAL | 0 refills | Status: DC
  Filled 2023-01-15: qty 12, 3d supply, fill #0

## 2023-01-15 NOTE — Discharge Instructions (Addendum)
You were seen in the ER for dizziness and elevated blood pressure.  Your lab work today was reassuring. I am precribing you a medicine called hydroxyzine to help with some anxiety and help you get some sleep.  I recommend following up with your primary doctor regarding your blood pressure medications, as well as following up with your therapist.   Continue to monitor how you're doing and return to the ER for new or worsening symptoms such as headaches, vision changes, chest pain, etc.

## 2023-01-15 NOTE — ED Triage Notes (Addendum)
Pt states her BP has been high recently for 3 days.  Pt states she has been having some dizziness, nausea.  Pt admits to sleeping problems and stress recently.  BP currently 130/91.  Pt states she is working 80+ hours a week and going to school, etc.  Pt also is moving and has 3 children

## 2023-01-15 NOTE — ED Provider Notes (Signed)
[place] [facility] Provider Note   CSN: [id number] Arrival date & time: 01/15/23  4098     History  Chief Complaint  Patient presents with   Dizziness    [name] is a 42 y.o. female with a past medical history of HTN, Type 2 DM, and anxiety who presents today with complaints of dizziness, nausea, and HTN that began over the last few days. She says her blood was staying in the 168/120 range and she has been inconsistent with taking her losartan. She says her symptoms increase when she thinks about everything that she has to do. She is taking her Wellbutrin and Propranolol as prescribed to help manage her anxiety. 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 She reports getting very minimal sleep over the last few months and feels generally fatigued and tired. She says she has not been eating or drinking enough. She denies abdominal pain, fevers, dysuria, weakness, vision changes, and headaches. Endorses a brief episode of SOB the other day that she attributes to anxiety.    Dizziness Associated symptoms: no chest pain and no shortness of breath        Home Medications Prior to Admission medications   Medication Sig Start Date End Date Taking? Authorizing Provider  hydrOXYzine (ATARAX) 25 MG tablet Take 1 tablet (25 mg total) by mouth every 6 (six) hours. 01/15/23  Yes Daneshia Tavano T, [name]  albuterol (PROVENTIL) (5 MG/ML) 0.5% nebulizer solution Take 0.5 mLs (2.5 mg total) by nebulization every 6 (six) hours as needed for wheezing or shortness of breath. 04/15/19   Georgetta Haber, NP  albuterol (VENTOLIN HFA) 108 (90 Base) MCG/ACT inhaler Inhale 1-2 puffs into the lungs every 6 (six) hours as needed for wheezing or shortness of breath. 07/23/20   Wieters, Hallie C, [name]   atorvastatin (LIPITOR) 20 MG tablet TAKE 1 TABLET BY MOUTH EVERY DAY 01/05/23   Arnette Felts, FNP  blood glucose meter kit and supplies KIT Dispense based on patient and insurance preference. Use up to four times daily as directed. (FOR ICD-9 250.00, 250.01). 07/23/20   Wieters, Hallie C, [name]  Blood Glucose Monitoring Suppl (ACCU-CHEK GUIDE) w/Device KIT 1 Device by Does not apply route daily in the afternoon. 05/29/22   Shamleffer, Konrad Dolores, MD  cefdinir (OMNICEF) 300 MG capsule Take 1 capsule (300 mg total) by mouth 2 (two) times daily. 07/29/22   Mardella Layman, MD  cetirizine (ZYRTEC ALLERGY) 10 MG tablet Take 1 tablet (10 mg total) by mouth daily. 04/26/21   Charlesetta Ivory, NP  cyclobenzaprine (FLEXERIL) 10 MG tablet Take 1 tablet (10 mg total) by mouth 3 (three) times daily as needed for muscle spasms. 03/04/22   Vanetta Mulders, MD  dapagliflozin propanediol (FARXIGA) 10 MG TABS tablet Take 1 tablet (10 mg total) by mouth daily. 01/09/23   Shamleffer, Konrad Dolores, MD  diclofenac (VOLTAREN) 50 MG EC tablet Take 1 tablet (50 mg total) by mouth 3 (three) times daily. 07/11/19   Adam Phenix, MD  ferrous sulfate 325 (65 FE) MG EC tablet Take 1 tablet (325 mg total) by mouth 3 (three) times daily with meals. 10/31/19 03/10/22  Rodriguez-Southworth, Nettie Elm, [name]  fluconazole (DIFLUCAN) 150 MG tablet Take one tablet by mouth as a single dose. May  repeat in 3 days if symptoms persist. 07/29/22   Mardella Layman, MD  fluticasone (FLONASE) 50 MCG/ACT nasal spray Place 1-2 sprays into both nostrils daily. 04/18/21   Ghumman, Ramandeep, NP  glucose blood (ACCU-CHEK GUIDE) test strip 1 each by Other route daily in the afternoon. Use as instructed 05/29/22   Shamleffer, Konrad Dolores, MD  ibuprofen (ADVIL) 800 MG tablet Take 800 mg by mouth 3 (three) times daily. 07/11/20   [provider]  insulin degludec (TRESIBA FLEXTOUCH) 100 UNIT/ML FlexTouch Pen Inject 40 Units into the skin daily.  01/09/23   Shamleffer, Konrad Dolores, MD  Insulin Pen Needle 31G X 8 MM MISC 1 Device by Does not apply route daily. 01/09/23   Shamleffer, Konrad Dolores, MD  Lancets (ACCU-CHEK MULTICLIX) lancets USE TO CHECK BLOOD SUGARS UP TO 4 TIMES DAILY  DX CODE:E11.9 05/29/22   Shamleffer, Konrad Dolores, MD  levonorgestrel (MIRENA) 20 MCG/24HR IUD 1 each by Intrauterine route once.    [provider]  losartan-hydrochlorothiazide (HYZAAR) 50-12.5 MG tablet TAKE 1 TABLET BY MOUTH DAILY 05/30/22 08/28/22  Arnette Felts, FNP  melatonin 5 MG TABS Take 1 tablet (5 mg total) by mouth at bedtime as needed. 04/28/22   Arnette Felts, FNP  metFORMIN (GLUCOPHAGE-XR) 750 MG 24 hr tablet Take 2 tablets (1,500 mg total) by mouth daily with breakfast. 01/09/23   Shamleffer, Konrad Dolores, MD  Multiple Vitamin (MULTIVITAMIN) capsule Take 1 capsule by mouth daily.    [provider]  promethazine-dextromethorphan (PROMETHAZINE-DM) 6.25-15 MG/5ML syrup Take 5 mLs by mouth 4 (four) times daily as needed for cough. 07/29/22   Mardella Layman, MD  propranolol (INDERAL) 10 MG tablet TAKE 1 TABLET(10 MG) BY MOUTH EVERY EVENING. No refills until pt is seen. 03/03/22   Arnette Felts, FNP  Semaglutide, 2 MG/DOSE, (OZEMPIC, 2 MG/DOSE,) 8 MG/3ML SOPN 2 mg by Other route once a week. INJECT UNDER THE SKIN ONCE A WEEK 01/09/23   Shamleffer, Konrad Dolores, MD  terconazole (TERAZOL 7) 0.4 % vaginal cream Place 1 applicator vaginally at bedtime. 06/24/22   Warden Fillers, MD  triamcinolone (KENALOG) 0.025 % ointment Apply 1 application topically 2 (two) times daily. 04/18/21   Charlesetta Ivory, NP  Vitamin D, Ergocalciferol, (DRISDOL) 1.25 MG (50000 UNIT) CAPS capsule Take 1 capsule by mouth on Tues and on Fridays. 04/23/21   Charlesetta Ivory, NP      Allergies    Other, Penicillins, Bactrim [sulfamethoxazole-trimethoprim], and Fish allergy    Review of Systems   Review of Systems  Eyes:  Negative for visual  disturbance.  Respiratory:  Negative for shortness of breath.   Cardiovascular:  Negative for chest pain and leg swelling.  Musculoskeletal:  Positive for neck pain.  Neurological:  Positive for dizziness. Negative for syncope.  Psychiatric/Behavioral:  Positive for sleep disturbance. The patient is nervous/anxious.   All other systems reviewed and are negative.   Physical Exam Updated Vital Signs BP (!) 130/91 (BP Location: Right Arm)   Pulse 98   Temp 98 F (36.7 C)   Resp 19   Ht 5\' 5"  (1.651 m)   Wt (!) 143.6 kg   SpO2 98%   BMI 52.68 kg/m  Physical Exam Vitals and nursing note reviewed.  Constitutional:      Appearance: Normal appearance.  HENT:     Head: Normocephalic and atraumatic.  Eyes:     Conjunctiva/sclera: Conjunctivae normal.  Cardiovascular:     Rate and Rhythm: Normal rate and regular rhythm.  Pulmonary:  Effort: Pulmonary effort is normal. No respiratory distress.     Breath sounds: Normal breath sounds.  Abdominal:     General: There is no distension.     Palpations: Abdomen is soft.     Tenderness: There is no abdominal tenderness.  Skin:    General: Skin is warm and dry.  Neurological:     General: No focal deficit present.     Mental Status: She is alert.     Comments: Neuro: Speech is clear, able to follow commands. CN III-XII intact grossly intact. PERRLA. EOMI. Sensation intact throughout. Str 5/5 all extremities.     ED Results / Procedures / Treatments   Labs (all labs ordered are listed, but only abnormal results are displayed) Labs Reviewed  BASIC METABOLIC PANEL - Abnormal; Notable for the following components:      Result Value   Sodium 131 (*)    Glucose, Bld 144 (*)    Calcium 8.8 (*)    All other components within normal limits  CBC  HCG, QUANTITATIVE, PREGNANCY    EKG None  Radiology No results found.  Procedures Procedures    Medications Ordered in ED Medications - No data to display  ED Course/ Medical  Decision Making/ A&P                             Medical Decision Making Amount and/or Complexity of Data Reviewed Labs: ordered.   This patient is a 42 y.o. female  who presents to the ED for concern of dizziness x 3 days.    Differential diagnoses prior to evaluation: The emergent differential diagnosis includes, but is not limited to,  BPPV, vestibular migraine, head trauma, AVM, intracranial tumor, multiple sclerosis, drug-related, CVA, vasovagal syncope, orthostatic hypotension, sepsis, hypoglycemia, electrolyte disturbance, anemia, anxiety/panic attack. This is not an exhaustive differential.   Past Medical History / Co-morbidities / Social History: HTN, Type 2 DM, and anxiety  Physical Exam: Physical exam performed. The pertinent findings include: Mildly hypertensive, otherwise normal vital signs.  Normal neurologic exam as above.  Lab Tests/Imaging studies: I personally interpreted labs/imaging and the pertinent results include: CBC and BMP grossly unremarkable.  Negative pregnancy.  As patient has a reassuring neurologic exam, stable blood pressure, and has no symptoms at present, will defer head imaging at this time.  Cardiac monitoring: EKG obtained and interpreted by my attending physician which shows: Sinus rhythm   Disposition: After consideration of the diagnostic results and the patients response to treatment, I feel that emergency department workup does not suggest an emergent condition requiring admission or immediate intervention beyond what has been performed at this time. The plan is: Discharged home with symptomatic management of elevated blood pressure readings, likely in the setting of increased stress.  Does not meet criteria for hypertensive urgency or emergency, and has reassuring exam and laboratory evaluation.  Do not feel strongly about changing patient's blood pressure medication at this time, encouraged her to follow-up with her PCP.  She is interested in  trying something to help her sleep better, will prescribe some hydroxyzine and encouraged her to follow-up with her therapist. The patient is safe for discharge and has been instructed to return immediately for worsening symptoms, change in symptoms or any other concerns.  Final Clinical Impression(s) / ED Diagnoses Final diagnoses:  Stress at home  Elevated blood pressure reading  Dizziness  Difficulty sleeping    Rx / DC Orders ED Discharge  Orders          Ordered    hydrOXYzine (ATARAX) 25 MG tablet  Every 6 hours        01/15/23 1053           Portions of this report may have been transcribed using voice recognition software. Every effort was made to ensure accuracy; however, inadvertent computerized transcription errors may be present.    Su Monks, [name] 01/15/23 1100    Loetta Rough, MD 01/16/23 [phone]

## 2023-01-19 ENCOUNTER — Telehealth: Payer: Self-pay

## 2023-01-19 NOTE — Transitions of Care (Post Inpatient/ED Visit) (Signed)
01/19/2023  Name: [name] MRN: [id number] DOB: May 20, 1981  Today's TOC FU Call Status: Today's TOC FU Call Status:: Successful TOC FU Call Competed TOC FU Call Complete Date: 01/19/23  Transition Care Management Follow-up Telephone Call Date of Discharge: 01/15/23 Discharge Facility: [facility] Type of Discharge: Emergency Department Reason for ED Visit: Other: How have you been since you were released from the hospital?: Same Any questions or concerns?: Yes Patient Questions/Concerns:: she had a question about her medication prescribed.  Items Reviewed: Did you receive and understand the discharge instructions provided?: Yes Medications obtained,verified, and reconciled?: Yes (Medications Reviewed) Any new allergies since your discharge?: No Dietary orders reviewed?: No Do you have support at home?: Yes People in Home: significant other Name of Support/Comfort Primary Source: Liborio Nixon  Medications Reviewed Today: Medications Reviewed Today     Reviewed by Hendricks Limes, RN (Registered Nurse) on 01/15/23 at 0901  Med List Status: <None>   Medication Order Taking? Sig Documenting Provider Last Dose Status Informant  albuterol (PROVENTIL) (5 MG/ML) 0.5% nebulizer solution [id number]  Take 0.5 mLs (2.5 mg total) by nebulization every 6 (six) hours as needed for wheezing or shortness of breath. Georgetta Haber, NP  Active   albuterol (VENTOLIN HFA) 108 (90 Base) MCG/ACT inhaler [id number]  Inhale 1-2 puffs into the lungs every 6 (six) hours as needed for wheezing or shortness of breath. Wieters, [place] C, [name]  Active   atorvastatin (LIPITOR) 20 MG tablet [id number]  TAKE 1 TABLET BY MOUTH EVERY DAY Arnette Felts, FNP  Active   blood glucose meter kit and supplies KIT [id number]  Dispense based on patient and insurance preference. Use up to four times daily as directed. (FOR ICD-9 250.00, 250.01). Wieters, Hallie C, [name]  Active   Blood Glucose Monitoring Suppl  (ACCU-CHEK GUIDE) w/Device KIT [id number]  1 Device by Does not apply route daily in the afternoon. Shamleffer, Konrad Dolores, MD  Active   cefdinir (OMNICEF) 300 MG capsule [id number]  Take 1 capsule (300 mg total) by mouth 2 (two) times daily. Mardella Layman, MD  Active   cetirizine (ZYRTEC ALLERGY) 10 MG tablet [id number]  Take 1 tablet (10 mg total) by mouth daily. Charlesetta Ivory, NP  Active   cyclobenzaprine (FLEXERIL) 10 MG tablet [id number]  Take 1 tablet (10 mg total) by mouth 3 (three) times daily as needed for muscle spasms. Vanetta Mulders, MD  Active   dapagliflozin propanediol (FARXIGA) 10 MG TABS tablet [id number]  Take 1 tablet (10 mg total) by mouth daily. Shamleffer, Konrad Dolores, MD  Active   diclofenac (VOLTAREN) 50 MG EC tablet [id number]  Take 1 tablet (50 mg total) by mouth 3 (three) times daily. Adam Phenix, MD  Active   ferrous sulfate 325 (65 FE) MG EC tablet [id number]  Take 1 tablet (325 mg total) by mouth 3 (three) times daily with meals. Rodriguez-Southworth, Nettie Elm, [name]  Expired 03/10/22 2359   fluconazole (DIFLUCAN) 150 MG tablet [id number]  Take one tablet by mouth as a single dose. May repeat in 3 days if symptoms persist. Mardella Layman, MD  Active   fluticasone [facility]) 50 MCG/ACT nasal spray [id number]  Place 1-2 sprays into both nostrils daily. Charlesetta Ivory, NP  Active   glucose blood (ACCU-CHEK GUIDE) test strip [id number]  1 each by Other route daily in the afternoon. Use as instructed Shamleffer, Konrad Dolores, MD  Active   ibuprofen (ADVIL) 800 MG tablet [id number]  Take 800 mg by mouth 3 (three)  times daily. [provider]  Active            Med Note Sylvie Farrier Aug 30, 2020  3:00 PM) Was taking this medicine because she needed her tooth pulled   insulin degludec (TRESIBA FLEXTOUCH) 100 UNIT/ML FlexTouch Pen [id number]  Inject 40 Units into the skin daily. Shamleffer, Konrad Dolores, MD  Active   Insulin Pen Needle 31G X 8 MM  MISC [id number]  1 Device by Does not apply route daily. Shamleffer, Konrad Dolores, MD  Active   Lancets (ACCU-CHEK MULTICLIX) lancets [id number]  USE TO CHECK BLOOD SUGARS UP TO 4 TIMES DAILY  DX CODE:E11.9 Shamleffer, Konrad Dolores, MD  Active   levonorgestrel (MIRENA) 20 MCG/24HR IUD [id number]  1 each by Intrauterine route once. [provider]  Active   losartan-hydrochlorothiazide [facility]) 50-12.5 MG tablet [id number]  TAKE 1 TABLET BY MOUTH DAILY Arnette Felts, FNP  Expired 08/28/22 2359   melatonin 5 MG TABS [id number]  Take 1 tablet (5 mg total) by mouth at bedtime as needed. Arnette Felts, FNP  Active   metFORMIN (GLUCOPHAGE-XR) 750 MG 24 hr tablet [id number]  Take 2 tablets (1,500 mg total) by mouth daily with breakfast. Shamleffer, Konrad Dolores, MD  Active   Multiple Vitamin (MULTIVITAMIN) capsule [id number]  Take 1 capsule by mouth daily. [provider]  Active   promethazine-dextromethorphan (PROMETHAZINE-DM) 6.25-15 MG/5ML syrup [id number]  Take 5 mLs by mouth 4 (four) times daily as needed for cough. Mardella Layman, MD  Active   propranolol (INDERAL) 10 MG tablet [id number]  TAKE 1 TABLET(10 MG) BY MOUTH EVERY EVENING. No refills until pt is seen. Arnette Felts, FNP  Active   Semaglutide, 2 MG/DOSE, (OZEMPIC, 2 MG/DOSE,) 8 MG/3ML SOPN [id number]  2 mg by Other route once a week. INJECT UNDER THE SKIN ONCE A WEEK Shamleffer, Konrad Dolores, MD  Active   terconazole (TERAZOL 7) 0.4 % vaginal cream [id number]  Place 1 applicator vaginally at bedtime. Warden Fillers, MD  Active   triamcinolone (KENALOG) 0.025 % ointment [id number]  Apply 1 application topically 2 (two) times daily. Charlesetta Ivory, NP  Active   Vitamin D, Ergocalciferol, (DRISDOL) 1.25 MG (50000 UNIT) CAPS capsule [id number]  Take 1 capsule by mouth on Tues and on Fridays. Charlesetta Ivory, NP  Active             Home Care and Equipment/Supplies: Were Home Health Services Ordered?: NA Any new  equipment or medical supplies ordered?: NA  Functional Questionnaire: Do you need assistance with bathing/showering or dressing?: No Do you need assistance with meal preparation?: No Do you need assistance with eating?: No Do you have difficulty maintaining continence: No Do you need assistance with getting out of bed/getting out of a chair/moving?: No Do you have difficulty managing or taking your medications?: No  Follow up appointments reviewed: PCP Follow-up appointment confirmed?: Yes Specialist Hospital Follow-up appointment confirmed?: NA Do you need transportation to your follow-up appointment?: No Do you understand care options if your condition(s) worsen?: Yes-patient verbalized understanding    SIGNATUREYL,RMA

## 2023-01-21 ENCOUNTER — Ambulatory Visit (INDEPENDENT_AMBULATORY_CARE_PROVIDER_SITE_OTHER): Payer: Medicaid Other | Admitting: Family Medicine

## 2023-01-21 ENCOUNTER — Encounter: Payer: Self-pay | Admitting: Family Medicine

## 2023-01-21 DIAGNOSIS — R42 Dizziness and giddiness: Secondary | ICD-10-CM

## 2023-01-21 DIAGNOSIS — Z6841 Body Mass Index (BMI) 40.0 and over, adult: Secondary | ICD-10-CM

## 2023-01-21 DIAGNOSIS — F439 Reaction to severe stress, unspecified: Secondary | ICD-10-CM | POA: Diagnosis not present

## 2023-01-21 DIAGNOSIS — F418 Other specified anxiety disorders: Secondary | ICD-10-CM | POA: Diagnosis not present

## 2023-01-21 DIAGNOSIS — F419 Anxiety disorder, unspecified: Secondary | ICD-10-CM

## 2023-01-21 MED ORDER — SERTRALINE HCL 50 MG PO TABS: 50.0000 mg | ORAL_TABLET | Freq: Every day | ORAL | 1 refills | Status: DC

## 2023-01-21 NOTE — Progress Notes (Unsigned)
I,[name],acting as a scribe for [employer], NP.,have documented all relevant documentation on the behalf of Stephanie Casstevens, NP,as directed by  [name], NP while in the presence of [name], NP.  Subjective:  Patient ID: [name] , female    DOB: 03-14-1981 , 42 y.o.   MRN: [id number]  Chief Complaint  Patient presents with   ER F/U    HPI  Patient presents today for a ER f/u. She went to [facility] on 01/15/23, because she  reports she was feeling weak. Patient states she thinks it is stress related because she reports she works so much and she has a lot of people in her house and she recently was house hunting, moved into their new home. She reports she feels better than she did last week. Filed Weights -------------------------------                01/21/23                         0929          -------------------------------  Weight: (!) 310 lb (140.6 kg) -------------------------------      Past Medical History:  Diagnosis Date   Abnormal Pap smear    f/u wnl   Asthma    Fibroid    Gestational diabetes    Hypertension    Morbid obesity (HCC)    Sleep apnea    Urinary tract infection      Family History  Problem Relation Age of Onset   Diabetes Father    Allergies Mother    Asthma Mother    Hypertension Mother    Diabetes Mother    Diabetes Maternal Grandmother    Heart disease Maternal Grandmother    Anesthesia problems Neg Hx    Hypotension Neg Hx    Malignant hyperthermia Neg Hx    Pseudochol deficiency Neg Hx    Hearing loss Neg Hx      Current Outpatient Medications:    albuterol (PROVENTIL) (5 MG/ML) 0.5% nebulizer solution, Take 0.5 mLs (2.5 mg total) by nebulization every 6 (six) hours as needed for wheezing or shortness of breath., Disp: 20 mL, Rfl: 12   albuterol (VENTOLIN HFA) 108 (90 Base) MCG/ACT inhaler, Inhale 1-2 puffs into the lungs every 6 (six) hours as needed for wheezing or shortness of breath.,  Disp: 18 g, Rfl: 0   atorvastatin (LIPITOR) 20 MG tablet, TAKE 1 TABLET BY MOUTH EVERY DAY, Disp: 90 tablet, Rfl: 1   blood glucose meter kit and supplies KIT, Dispense based on patient and insurance preference. Use up to four times daily as directed. (FOR ICD-9 250.00, 250.01)., Disp: 1 each, Rfl: 0   Blood Glucose Monitoring Suppl (ACCU-CHEK GUIDE) w/Device KIT, 1 Device by Does not apply route daily in the afternoon., Disp: 1 kit, Rfl: 0   cefdinir (OMNICEF) 300 MG capsule, Take 1 capsule (300 mg total) by mouth 2 (two) times daily., Disp: 20 capsule, Rfl: 0   cetirizine (ZYRTEC ALLERGY) 10 MG tablet, Take 1 tablet (10 mg total) by mouth daily., Disp: 90 tablet, Rfl: 2   cyclobenzaprine (FLEXERIL) 10 MG tablet, Take 1 tablet (10 mg total) by mouth 3 (three) times daily as needed for muscle spasms., Disp: 20 tablet, Rfl: 0   dapagliflozin propanediol (FARXIGA) 10 MG TABS tablet, Take 1 tablet (10 mg total) by mouth daily., Disp: 90 tablet, Rfl: 3   diclofenac (VOLTAREN) 50  MG EC tablet, Take 1 tablet (50 mg total) by mouth 3 (three) times daily., Disp: 45 tablet, Rfl: 3   fluconazole (DIFLUCAN) 150 MG tablet, Take one tablet by mouth as a single dose. May repeat in 3 days if symptoms persist., Disp: 2 tablet, Rfl: 0   fluticasone (FLONASE) 50 MCG/ACT nasal spray, Place 1-2 sprays into both nostrils daily., Disp: 16 g, Rfl: 0   glucose blood (ACCU-CHEK GUIDE) test strip, 1 each by Other route daily in the afternoon. Use as instructed, Disp: 100 each, Rfl: 3   hydrOXYzine (ATARAX) 25 MG tablet, Take 1 tablet (25 mg total) by mouth every 6 (six) hours., Disp: 12 tablet, Rfl: 0   ibuprofen (ADVIL) 800 MG tablet, Take 800 mg by mouth 3 (three) times daily., Disp: , Rfl:    insulin degludec (TRESIBA FLEXTOUCH) 100 UNIT/ML FlexTouch Pen, Inject 40 Units into the skin daily., Disp: 45 mL, Rfl: 3   Insulin Pen Needle 31G X 8 MM MISC, 1 Device by Does not apply route daily., Disp: 100 each, Rfl: 1   Lancets  (ACCU-CHEK MULTICLIX) lancets, USE TO CHECK BLOOD SUGARS UP TO 4 TIMES DAILY  DX CODE:E11.9, Disp: 100 each, Rfl: 12   levonorgestrel (MIRENA) 20 MCG/24HR IUD, 1 each by Intrauterine route once., Disp: , Rfl:    losartan-hydrochlorothiazide (HYZAAR) 50-12.5 MG tablet, TAKE 1 TABLET BY MOUTH DAILY, Disp: 90 tablet, Rfl: 1   melatonin 5 MG TABS, Take 1 tablet (5 mg total) by mouth at bedtime as needed., Disp: 90 tablet, Rfl: 1   metFORMIN (GLUCOPHAGE-XR) 750 MG 24 hr tablet, Take 2 tablets (1,500 mg total) by mouth daily with breakfast., Disp: 180 tablet, Rfl: 3   Multiple Vitamin (MULTIVITAMIN) capsule, Take 1 capsule by mouth daily., Disp: , Rfl:    promethazine-dextromethorphan (PROMETHAZINE-DM) 6.25-15 MG/5ML syrup, Take 5 mLs by mouth 4 (four) times daily as needed for cough., Disp: 118 mL, Rfl: 0   propranolol (INDERAL) 10 MG tablet, TAKE 1 TABLET(10 MG) BY MOUTH EVERY EVENING. No refills until pt is seen., Disp: 30 tablet, Rfl: 0   Semaglutide, 2 MG/DOSE, (OZEMPIC, 2 MG/DOSE,) 8 MG/3ML SOPN, 2 mg by Other route once a week. INJECT UNDER THE SKIN ONCE A WEEK, Disp: 9 mL, Rfl: 3   terconazole (TERAZOL 7) 0.4 % vaginal cream, Place 1 applicator vaginally at bedtime., Disp: 45 g, Rfl: 0   triamcinolone (KENALOG) 0.025 % ointment, Apply 1 application topically 2 (two) times daily., Disp: 30 g, Rfl: 0   Vitamin D, Ergocalciferol, (DRISDOL) 1.25 MG (50000 UNIT) CAPS capsule, Take 1 capsule by mouth on Tues and on Fridays., Disp: 24 capsule, Rfl: 0   ferrous sulfate 325 (65 FE) MG EC tablet, Take 1 tablet (325 mg total) by mouth 3 (three) times daily with meals., Disp: 90 tablet, Rfl: 2   Allergies  Allergen Reactions   Other Anaphylaxis and Other (See Comments)    Pt has fish allergy Pt has allergy to butter cookies   Penicillins Rash and Other (See Comments)    Told MD that PCN reaction is rash and that she has taken amoxicillin before   Bactrim [Sulfamethoxazole-Trimethoprim]     Diarrhea,  abdominal cramps and rash   Fish Allergy     Other reaction(s): Unknown     Review of Systems  Constitutional:  Positive for fatigue.  Eyes: Negative.   Skin: Negative.   Neurological:  Positive for dizziness and weakness.  Psychiatric/Behavioral: Negative.  Today's Vitals   01/21/23 0929  BP: (!) 134/90  Pulse: (!) 119  Temp: 98.4 F (36.9 C)  Weight: (!) 310 lb (140.6 kg)  Height: 5\' 5"  (1.651 m)  PainSc: 0-No pain   Body mass index is 51.59 kg/m.  Wt Readings from Last 3 Encounters:  01/21/23 (!) 310 lb (140.6 kg)  01/15/23 (!) 316 lb 9.3 oz (143.6 kg)  07/08/22 (!) 309 lb 3.2 oz (140.3 kg)     Objective:  Physical Exam Cardiovascular:     Rate and Rhythm: Tachycardia present.  Skin:    General: Skin is warm and dry.  Neurological:     General: No focal deficit present.     Mental Status: She is alert and oriented to person, place, and time.         Assessment And Plan:  Stress at home  Fatigue, unspecified type  Dizziness     Return if symptoms worsen or fail to improve.  Patient was given opportunity to ask questions. Patient verbalized understanding of the plan and was able to repeat key elements of the plan. All questions were answered to their satisfaction.  Mikhail Hallenbeck Moshe Salisbury, NP  I, Avia Merkley Moshe Salisbury, NP, have reviewed all documentation for this visit. The documentation on 01/21/23 for the exam, diagnosis, procedures, and orders are all accurate and complete.   IF YOU HAVE BEEN REFERRED TO A SPECIALIST, IT MAY TAKE 1-2 WEEKS TO SCHEDULE/PROCESS THE REFERRAL. IF YOU HAVE NOT HEARD FROM US/SPECIALIST IN TWO WEEKS, PLEASE GIVE [place] A CALL AT [phone].   THE PATIENT IS ENCOURAGED TO PRACTICE SOCIAL DISTANCING DUE TO THE COVID-19 PANDEMIC.

## 2023-01-22 DIAGNOSIS — F439 Reaction to severe stress, unspecified: Secondary | ICD-10-CM | POA: Insufficient documentation

## 2023-01-22 NOTE — Assessment & Plan Note (Signed)
PHQ -9 score 21  GAD score 15  Start sertraline 50mg  Q D

## 2023-01-22 NOTE — Assessment & Plan Note (Signed)
Improved since ER visit

## 2023-01-22 NOTE — Assessment & Plan Note (Signed)
Advised to re-access things

## 2023-01-22 NOTE — Assessment & Plan Note (Signed)
She is encouraged to strive for BMI less than 30 to decrease cardiac risk. Advised to aim for at least 150 minutes of exercise per week.  

## 2023-02-12 ENCOUNTER — Other Ambulatory Visit: Payer: Self-pay | Admitting: Family Medicine

## 2023-02-12 DIAGNOSIS — F418 Other specified anxiety disorders: Secondary | ICD-10-CM

## 2023-04-22 ENCOUNTER — Encounter: Payer: Self-pay | Admitting: Pharmacist

## 2023-05-04 ENCOUNTER — Encounter: Payer: Self-pay | Admitting: Nurse Practitioner

## 2023-05-04 ENCOUNTER — Ambulatory Visit (INDEPENDENT_AMBULATORY_CARE_PROVIDER_SITE_OTHER): Payer: Self-pay | Admitting: Nurse Practitioner

## 2023-05-04 VITALS — BP 100/60 | HR 108 | Temp 99.5°F | Ht 65.0 in | Wt 305.6 lb

## 2023-05-04 DIAGNOSIS — E1169 Type 2 diabetes mellitus with other specified complication: Secondary | ICD-10-CM

## 2023-05-04 DIAGNOSIS — Z2821 Immunization not carried out because of patient refusal: Secondary | ICD-10-CM

## 2023-05-04 DIAGNOSIS — F418 Other specified anxiety disorders: Secondary | ICD-10-CM

## 2023-05-04 DIAGNOSIS — Z794 Long term (current) use of insulin: Secondary | ICD-10-CM

## 2023-05-04 DIAGNOSIS — Z114 Encounter for screening for human immunodeficiency virus [HIV]: Secondary | ICD-10-CM | POA: Diagnosis not present

## 2023-05-04 DIAGNOSIS — Z79899 Other long term (current) drug therapy: Secondary | ICD-10-CM

## 2023-05-04 DIAGNOSIS — Z113 Encounter for screening for infections with a predominantly sexual mode of transmission: Secondary | ICD-10-CM

## 2023-05-04 DIAGNOSIS — E559 Vitamin D deficiency, unspecified: Secondary | ICD-10-CM

## 2023-05-04 DIAGNOSIS — R051 Acute cough: Secondary | ICD-10-CM

## 2023-05-04 DIAGNOSIS — I10 Essential (primary) hypertension: Secondary | ICD-10-CM

## 2023-05-04 DIAGNOSIS — Z0000 Encounter for general adult medical examination without abnormal findings: Secondary | ICD-10-CM

## 2023-05-04 DIAGNOSIS — R197 Diarrhea, unspecified: Secondary | ICD-10-CM

## 2023-05-04 DIAGNOSIS — N898 Other specified noninflammatory disorders of vagina: Secondary | ICD-10-CM

## 2023-05-04 DIAGNOSIS — E782 Mixed hyperlipidemia: Secondary | ICD-10-CM

## 2023-05-04 DIAGNOSIS — R110 Nausea: Secondary | ICD-10-CM

## 2023-05-04 LAB — POC SOFIA 2 FLU + SARS ANTIGEN FIA
Influenza A, POC: NEGATIVE
Influenza B, POC: NEGATIVE
SARS Coronavirus 2 Ag: NEGATIVE

## 2023-05-04 LAB — POCT URINALYSIS DIP (CLINITEK)
Blood, UA: NEGATIVE
Glucose, UA: 500 mg/dL — AB
Ketones, POC UA: NEGATIVE mg/dL
Leukocytes, UA: NEGATIVE
Nitrite, UA: NEGATIVE
POC PROTEIN,UA: >=300 — AB
Spec Grav, UA: >=1.03 — AB (ref 1.010–1.025)
Urobilinogen, UA: 1 U/dL
pH, UA: 6.5 (ref 5.0–8.0)

## 2023-05-04 NOTE — Progress Notes (Signed)
Madelaine Bhat, CMA,acting as a Neurosurgeon for Arnette Felts, FNP.,have documented all relevant documentation on the behalf of Arnette Felts, FNP,as directed by  Arnette Felts, FNP while in the presence of Arnette Felts, FNP.  Subjective:    Patient ID: [name] , female    DOB: 1981/04/13 , 42 y.o.   MRN: [id number]  Chief Complaint  Patient presents with   Annual Exam    HPI  Patient presents today for HM, Patient reports compliance with medication. Patient denies any chest pain, SOB, or headaches. Patient reports since yesterday she has had diarrhea and nausea. Patient reports she has had a cough since last week. Patient report she believes she has a yeast infection.    She has been to Endocrinology since her last visit without any changes.   Diarrhea  This is a new problem. The current episode started today. The problem occurs 2 to 4 times per day. The stool consistency is described as Watery. The patient states that diarrhea does not awaken her from sleep. Associated symptoms include coughing. Pertinent negatives include no vomiting. Associated symptoms comments: nausea. Risk factors include ill contacts (child she is caring for is sick with cold symptoms).     Past Medical History:  Diagnosis Date   Abnormal Pap smear    f/u wnl   Asthma    Fibroid    Gestational diabetes    Hypertension    Morbid obesity (HCC)    Sleep apnea    Urinary tract infection      Family History  Problem Relation Age of Onset   Diabetes Father    Allergies Mother    Asthma Mother    Hypertension Mother    Diabetes Mother    Diabetes Maternal Grandmother    Heart disease Maternal Grandmother    Anesthesia problems Neg Hx    Hypotension Neg Hx    Malignant hyperthermia Neg Hx    Pseudochol deficiency Neg Hx    Hearing loss Neg Hx      Current Outpatient Medications:    albuterol (PROVENTIL) (5 MG/ML) 0.5% nebulizer solution, Take 0.5 mLs (2.5 mg total) by nebulization every 6 (six)  hours as needed for wheezing or shortness of breath., Disp: 20 mL, Rfl: 12   albuterol (VENTOLIN HFA) 108 (90 Base) MCG/ACT inhaler, Inhale 1-2 puffs into the lungs every 6 (six) hours as needed for wheezing or shortness of breath., Disp: 18 g, Rfl: 0   atorvastatin (LIPITOR) 20 MG tablet, TAKE 1 TABLET BY MOUTH EVERY DAY, Disp: 90 tablet, Rfl: 1   blood glucose meter kit and supplies KIT, Dispense based on patient and insurance preference. Use up to four times daily as directed. (FOR ICD-9 250.00, 250.01)., Disp: 1 each, Rfl: 0   Blood Glucose Monitoring Suppl (ACCU-CHEK GUIDE) w/Device KIT, 1 Device by Does not apply route daily in the afternoon., Disp: 1 kit, Rfl: 0   cefdinir (OMNICEF) 300 MG capsule, Take 1 capsule (300 mg total) by mouth 2 (two) times daily., Disp: 20 capsule, Rfl: 0   cetirizine (ZYRTEC ALLERGY) 10 MG tablet, Take 1 tablet (10 mg total) by mouth daily., Disp: 90 tablet, Rfl: 2   cyclobenzaprine (FLEXERIL) 10 MG tablet, Take 1 tablet (10 mg total) by mouth 3 (three) times daily as needed for muscle spasms., Disp: 20 tablet, Rfl: 0   dapagliflozin propanediol (FARXIGA) 10 MG TABS tablet, Take 1 tablet (10 mg total) by mouth daily., Disp: 90 tablet, Rfl: 3   fluconazole (  DIFLUCAN) 150 MG tablet, Take one tablet by mouth as a single dose. May repeat in 3 days if symptoms persist., Disp: 2 tablet, Rfl: 0   fluticasone (FLONASE) 50 MCG/ACT nasal spray, Place 1-2 sprays into both nostrils daily., Disp: 16 g, Rfl: 0   glucose blood (ACCU-CHEK GUIDE) test strip, 1 each by Other route daily in the afternoon. Use as instructed, Disp: 100 each, Rfl: 3   hydrOXYzine (ATARAX) 25 MG tablet, Take 1 tablet (25 mg total) by mouth every 6 (six) hours., Disp: 12 tablet, Rfl: 0   insulin degludec (TRESIBA FLEXTOUCH) 100 UNIT/ML FlexTouch Pen, Inject 40 Units into the skin daily., Disp: 45 mL, Rfl: 3   Insulin Pen Needle 31G X 8 MM MISC, 1 Device by Does not apply route daily., Disp: 100 each, Rfl:  1   Lancets (ACCU-CHEK MULTICLIX) lancets, USE TO CHECK BLOOD SUGARS UP TO 4 TIMES DAILY  DX CODE:E11.9, Disp: 100 each, Rfl: 12   levonorgestrel (MIRENA) 20 MCG/24HR IUD, 1 each by Intrauterine route once., Disp: , Rfl:    melatonin 5 MG TABS, Take 1 tablet (5 mg total) by mouth at bedtime as needed., Disp: 90 tablet, Rfl: 1   metFORMIN (GLUCOPHAGE-XR) 750 MG 24 hr tablet, Take 2 tablets (1,500 mg total) by mouth daily with breakfast., Disp: 180 tablet, Rfl: 3   Multiple Vitamin (MULTIVITAMIN) capsule, Take 1 capsule by mouth daily., Disp: , Rfl:    promethazine-dextromethorphan (PROMETHAZINE-DM) 6.25-15 MG/5ML syrup, Take 5 mLs by mouth 4 (four) times daily as needed for cough., Disp: 118 mL, Rfl: 0   propranolol (INDERAL) 10 MG tablet, TAKE 1 TABLET(10 MG) BY MOUTH EVERY EVENING. No refills until pt is seen., Disp: 30 tablet, Rfl: 0   Semaglutide, 2 MG/DOSE, (OZEMPIC, 2 MG/DOSE,) 8 MG/3ML SOPN, 2 mg by Other route once a week. INJECT UNDER THE SKIN ONCE A WEEK, Disp: 9 mL, Rfl: 3   sertraline (ZOLOFT) 50 MG tablet, TAKE 1 TABLET BY MOUTH EVERY DAY, Disp: 90 tablet, Rfl: 1   terconazole (TERAZOL 7) 0.4 % vaginal cream, Place 1 applicator vaginally at bedtime., Disp: 45 g, Rfl: 0   triamcinolone (KENALOG) 0.025 % ointment, Apply 1 application topically 2 (two) times daily., Disp: 30 g, Rfl: 0   Vitamin D, Ergocalciferol, (DRISDOL) 1.25 MG (50000 UNIT) CAPS capsule, Take 1 capsule by mouth on Tues and on Fridays., Disp: 24 capsule, Rfl: 0   ferrous sulfate 325 (65 FE) MG EC tablet, Take 1 tablet (325 mg total) by mouth 3 (three) times daily with meals., Disp: 90 tablet, Rfl: 2   losartan-hydrochlorothiazide (HYZAAR) 50-12.5 MG tablet, TAKE 1 TABLET BY MOUTH DAILY, Disp: 90 tablet, Rfl: 1   Allergies  Allergen Reactions   Other Anaphylaxis and Other (See Comments)    Pt has fish allergy Pt has allergy to butter cookies   Penicillins Rash and Other (See Comments)    Told MD that PCN reaction is  rash and that she has taken amoxicillin before   Bactrim [Sulfamethoxazole-Trimethoprim]     Diarrhea, abdominal cramps and rash   Fish Allergy     Other reaction(s): Unknown      The patient states she uses IUD for birth control. Patient's last menstrual period was 03/31/2023.  Negative for: breast discharge, breast lump(s), breast pain and breast self exam. Associated symptoms include abnormal vaginal bleeding. Pertinent negatives include abnormal bleeding (hematology), anxiety, decreased libido, depression, difficulty falling sleep, dyspareunia, history of infertility, nocturia, sexual dysfunction, sleep disturbances, urinary  incontinence, urinary urgency, vaginal discharge and vaginal itching. Diet regular; "all over the place".  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   The patient's tobacco use is:  Social History   Tobacco Use  Smoking Status Former   Current packs/day: 0.00   Types: Cigarettes   Quit date: 08/08/2019   Years since quitting: 3.7  Smokeless Tobacco Former   She has been exposed to passive smoke. The patient's alcohol use is:  Social History   Substance and Sexual Activity  Alcohol Use No   Comment: occasional   Additional information: Last pap 2023, next one scheduled for 2028.    Review of Systems  Constitutional: Negative.   HENT: Negative.    Eyes: Negative.   Respiratory:  Positive for cough.   Cardiovascular: Negative.   Gastrointestinal:  Positive for diarrhea. Negative for vomiting.  Endocrine: Negative.   Genitourinary: Negative.   Musculoskeletal: Negative.   Skin: Negative.   Allergic/Immunologic: Negative.   Neurological: Negative.   Hematological: Negative.   Psychiatric/Behavioral: Negative.       Title   Diabetic Foot Exam - detailed Date & Time: 05/04/2023  5:18 PM Diabetic Foot exam was performed with the following  findings: Yes  Visual Foot Exam completed.: Yes  Is there a history of foot ulcer?: Yes Is there a foot ulcer now?: No Is there swelling?: No Is there elevated skin temperature?: No Is there abnormal foot shape?: No Is there a claw toe deformity?: No Are the toenails long?: No Are the toenails thick?: No Are the toenails ingrown?: No Is the skin thin, fragile, shiny and hairless?": No Normal Range of Motion?: Yes Is there foot or ankle muscle weakness?: No Do you have pain in calf while walking?: No Are the shoes appropriate in style and fit?:  (Comment: wearing flip flops with good cushions) Can the patient see the bottom of their feet?: Yes Pulse Foot Exam completed.: Yes   Right Posterior Tibialis: Present Left posterior Tibialis: Present   Right Dorsalis Pedis: Present Left Dorsalis Pedis: Present     Sensory Foot Exam Completed.: Yes Semmes-Weinstein Monofilament Test "+" means "has sensation" and "-" means "no sensation"   R Site 1-Great Toe: Pos L Site 1-Great Toe: Pos   R Site 4: Pos L Site 4: Pos   R Site 6: Pos L Site 6: Pos     Image components are not supported.   Image components are not supported. Image components are not supported.  Tuning Fork Comments Slight decrease in sensation bilaterally      Today's Vitals   05/04/23 1019  BP: 100/60  Pulse: (!) 108  Temp: 99.5 F (37.5 C)  TempSrc: Oral  Weight: (!) 305 lb 9.6 oz (138.6 kg)  Height: 5\' 5"  (1.651 m)  PainSc: 0-No pain   Body mass index is 50.85 kg/m.  Wt Readings from Last 3 Encounters:  05/04/23 (!) 305 lb 9.6 oz (138.6 kg)  01/21/23 (!) 310 lb (140.6 kg)  01/15/23 (!) 316 lb 9.3 oz (143.6 kg)       05/04/2023   11:03 AM 01/21/2023    2:37 PM 04/28/2022   10:56 AM 03/10/2022   10:20 AM 04/18/2021   11:14 AM  Depression screen PHQ 2/9  Decreased Interest 3 3 0 0 0  Down, Depressed, Hopeless 0 3 0 0 1  PHQ -  2 Score 3 6 0 0 1  Altered sleeping 1 3   1   Tired, decreased energy 3 3    0  Change in appetite 3 3   1   Feeling bad or failure about yourself  0 0   1  Trouble concentrating 1 3   0  Moving slowly or fidgety/restless 0 3   0  Suicidal thoughts 0 0   0  PHQ-9 Score 11 21   4   Difficult doing work/chores Somewhat difficult Very difficult       Objective:  Physical Exam Vitals reviewed.  Constitutional:      General: She is not in acute distress.    Appearance: Normal appearance. She is well-developed. She is obese.  HENT:     Head: Normocephalic and atraumatic.     Right Ear: Hearing, tympanic membrane, ear canal and external ear normal. There is no impacted cerumen.     Left Ear: Hearing, tympanic membrane, ear canal and external ear normal. There is no impacted cerumen.     Nose: Nose normal.     Mouth/Throat:     Mouth: Mucous membranes are moist.  Eyes:     General: Lids are normal.     Extraocular Movements: Extraocular movements intact.     Conjunctiva/sclera: Conjunctivae normal.     Pupils: Pupils are equal, round, and reactive to light.     Funduscopic exam:    Right eye: No papilledema.        Left eye: No papilledema.  Neck:     Thyroid: No thyroid mass.     Vascular: No carotid bruit.  Cardiovascular:     Rate and Rhythm: Normal rate and regular rhythm.     Pulses: Normal pulses.     Heart sounds: Normal heart sounds. No murmur heard. Pulmonary:     Effort: Pulmonary effort is normal. No respiratory distress.     Breath sounds: Normal breath sounds. No wheezing.  Chest:     Chest wall: No mass.  Breasts:    Tanner Score is 5.     Right: Normal. No mass or tenderness.     Left: Normal. No mass or tenderness.  Abdominal:     General: Abdomen is flat. Bowel sounds are normal. There is no distension.     Palpations: Abdomen is soft.     Tenderness: There is no abdominal tenderness.  Genitourinary:    Comments: Deferred - followed by GYN Musculoskeletal:        General: No swelling. Normal range of motion.     Cervical back:  Full passive range of motion without pain, normal range of motion and neck supple.     Right lower leg: No edema.     Left lower leg: No edema.  Lymphadenopathy:     Upper Body:     Right upper body: No supraclavicular, axillary or pectoral adenopathy.     Left upper body: No supraclavicular, axillary or pectoral adenopathy.  Skin:    General: Skin is warm and dry.     Capillary Refill: Capillary refill takes less than 2 seconds.  Neurological:     General: No focal deficit present.     Mental Status: She is alert and oriented to person, place, and time.     Cranial Nerves: No cranial nerve deficit.     Sensory: No sensory deficit.     Motor: No weakness.  Psychiatric:        Mood and Affect: Mood normal.  Behavior: Behavior normal.        Thought Content: Thought content normal.        Judgment: Judgment normal.         Assessment And Plan:     Encounter for annual health examination Assessment & Plan: Behavior modifications discussed and diet history reviewed.   Pt will continue to exercise regularly and modify diet with low GI, plant based foods and decrease intake of processed foods.  Recommend intake of daily multivitamin, Vitamin D, and calcium.  Recommend mammogram for preventive screenings, as well as recommend immunizations that include influenza, TDAP    Depression with anxiety Assessment & Plan: Depression score is down to 11 from 21.    Type 2 diabetes mellitus without complication, with long-term current use of insulin (HCC) Assessment & Plan: Chronic, stable. Continue f/u with Endocrinology Continue with current medications Encouraged to limit intake of sugary foods and drinks Encouraged to increase physical activity to 150 minutes per week Diabetic foot exam done, no abnormal findings Eye exam is up to date Suggestion is made for her to avoid simple carbohydrates from her diet including Cakes, Sweet Desserts, Ice Cream, Soda (diet and regular),  Sweet Tea, Candies, Chips, Cookies, Store Bought Juices, Alcohol in Excess of 1-2 drinks a day, Artificial Sweeteners, Coffee Creamer, and "Sugar-free" Products. This will help patient to have more stable blood glucose    Orders: -     Hemoglobin A1c -     NuSwab Vaginitis Plus (VG+)  Essential hypertension Assessment & Plan: Blood pressure is well controlled, continue current medications EKG done   Orders: -     EKG 12-Lead -     POCT URINALYSIS DIP (CLINITEK) -     Microalbumin / creatinine urine ratio -     CMP14+EGFR  Mixed hyperlipidemia Assessment & Plan: Continue statin, tolerating medications well.   Orders: -     CMP14+EGFR -     Lipid panel  Vitamin D deficiency Assessment & Plan: Will check vitamin D level and supplement as needed.    Also encouraged to spend 15 minutes in the sun daily.    Orders: -     VITAMIN D 25 Hydroxy (Vit-D Deficiency, Fractures)  Acute cough Assessment & Plan: Negative rapid covid and influenza. She is to take HBP coricidan brand medications.   Orders: -     POC SOFIA 2 FLU + SARS ANTIGEN FIA  Diarrhea, unspecified type Assessment & Plan: She is to get a sample for any bacteria due to her diarrhea. Avoid increased fiber foods.   Orders: -     POC SOFIA 2 FLU + SARS ANTIGEN FIA -     Stool culture -     Clostridium difficile EIA -     Ova and parasite examination  Nausea -     POC SOFIA 2 FLU + SARS ANTIGEN FIA  Influenza vaccination declined Assessment & Plan: Patient declined influenza vaccination at this time. Patient is aware that influenza vaccine prevents illness in 70% of healthy people, and reduces hospitalizations to 30-70% in elderly. This vaccine is recommended annually. Education has been provided regarding the importance of this vaccine but patient still declined. Advised may receive this vaccine at local pharmacy or Health Dept.or vaccine clinic. Aware to provide a copy of the vaccination record if obtained  from local pharmacy or Health Dept.  Pt is willing to accept risk associated with refusing vaccination. Will return in 2 weeks for NV influenza  COVID-19 vaccination declined Assessment & Plan: Declines covid 19 vaccine. Discussed risk of covid 95 and if she changes her mind about the vaccine to call the office. Education has been provided regarding the importance of this vaccine but patient still declined. Advised may receive this vaccine at local pharmacy or Health Dept.or vaccine clinic. Aware to provide a copy of the vaccination record if obtained from local pharmacy or Health Dept.  Encouraged to take multivitamin, vitamin d, vitamin c and zinc to increase immune system. Aware can call office if would like to have vaccine here at office. Verbalized acceptance and understanding. Will return in 2 weeks for NV covid   Vaginal itching Assessment & Plan: Likely related to her diabetes however will check for STDs   Screening for STDs (sexually transmitted diseases) -     RPR -     HSV 1 and 2 Ab, IgG  Other long term (current) drug therapy -     CBC with Differential/Platelet  Encounter for HIV (human immunodeficiency virus) test -     HIV Antibody (routine testing w rflx)     Return for 1 year physical, 6 month bp check; NV in 2 weeks for covid and flu vaccine. Patient was given opportunity to ask questions. Patient verbalized understanding of the plan and was able to repeat key elements of the plan. All questions were answered to their satisfaction.   Arnette Felts, FNP   I, Arnette Felts, FNP, have reviewed all documentation for this visit. The documentation on 05/04/23 for the exam, diagnosis, procedures, and orders are all accurate and complete.

## 2023-05-05 LAB — CMP14+EGFR
ALT: 39 [IU]/L — ABNORMAL HIGH (ref 0–32)
AST: 32 [IU]/L (ref 0–40)
Albumin: 4 g/dL (ref 3.9–4.9)
Alkaline Phosphatase: 107 [IU]/L (ref 44–121)
BUN/Creatinine Ratio: 16 (ref 9–23)
BUN: 11 mg/dL (ref 6–24)
Bilirubin Total: 0.5 mg/dL (ref 0.0–1.2)
CO2: 23 mmol/L (ref 20–29)
Calcium: 9.7 mg/dL (ref 8.7–10.2)
Chloride: 97 mmol/L (ref 96–106)
Creatinine, Ser: 0.7 mg/dL (ref 0.57–1.00)
Globulin, Total: 3.3 g/dL (ref 1.5–4.5)
Glucose: 224 mg/dL — ABNORMAL HIGH (ref 70–99)
Potassium: 4.2 mmol/L (ref 3.5–5.2)
Sodium: 135 mmol/L (ref 134–144)
Total Protein: 7.3 g/dL (ref 6.0–8.5)
eGFR: 111 mL/min/{1.73_m2} (ref >59)

## 2023-05-05 LAB — CBC WITH DIFFERENTIAL/PLATELET
Basophils Absolute: 0.1 10*3/uL (ref 0.0–0.2)
Basos: 1 %
EOS (ABSOLUTE): 0.3 10*3/uL (ref 0.0–0.4)
Eos: 4 %
Hematocrit: 43.7 % (ref 34.0–46.6)
Hemoglobin: 13.4 g/dL (ref 11.1–15.9)
Immature Grans (Abs): 0.1 10*3/uL (ref 0.0–0.1)
Immature Granulocytes: 1 %
Lymphocytes Absolute: 0.9 10*3/uL (ref 0.7–3.1)
Lymphs: 14 %
MCH: 26.5 pg — ABNORMAL LOW (ref 26.6–33.0)
MCHC: 30.7 g/dL — ABNORMAL LOW (ref 31.5–35.7)
MCV: 87 fL (ref 79–97)
Monocytes Absolute: 0.8 10*3/uL (ref 0.1–0.9)
Monocytes: 12 %
Neutrophils Absolute: 4.6 10*3/uL (ref 1.4–7.0)
Neutrophils: 68 %
Platelets: 294 10*3/uL (ref 150–450)
RBC: 5.05 x10E6/uL (ref 3.77–5.28)
RDW: 13.6 % (ref 11.7–15.4)
WBC: 6.7 10*3/uL (ref 3.4–10.8)

## 2023-05-05 LAB — HEMOGLOBIN A1C
Est. average glucose Bld gHb Est-mCnc: 266 mg/dL
Hgb A1c MFr Bld: 10.9 % — ABNORMAL HIGH (ref 4.8–5.6)

## 2023-05-05 LAB — MICROALBUMIN / CREATININE URINE RATIO
Creatinine, Urine: 358.2 mg/dL
Microalb/Creat Ratio: 328 mg/g{creat} — ABNORMAL HIGH (ref 0–29)
Microalbumin, Urine: 1174.3 ug/mL

## 2023-05-05 LAB — LIPID PANEL
Chol/HDL Ratio: 4.6 {ratio} — ABNORMAL HIGH (ref 0.0–4.4)
Cholesterol, Total: 211 mg/dL — ABNORMAL HIGH (ref 100–199)
HDL: 46 mg/dL (ref >39)
LDL Chol Calc (NIH): 135 mg/dL — ABNORMAL HIGH (ref 0–99)
Triglycerides: 170 mg/dL — ABNORMAL HIGH (ref 0–149)
VLDL Cholesterol Cal: 30 mg/dL (ref 5–40)

## 2023-05-05 LAB — RPR: RPR Ser Ql: NONREACTIVE

## 2023-05-05 LAB — HIV ANTIBODY (ROUTINE TESTING W REFLEX): HIV Screen 4th Generation wRfx: NONREACTIVE

## 2023-05-05 LAB — HSV 1 AND 2 AB, IGG
HSV 1 Glycoprotein G Ab, IgG: 2.14 {index} — ABNORMAL HIGH (ref 0.00–0.90)
HSV 2 IgG, Type Spec: 16.8 {index} — ABNORMAL HIGH (ref 0.00–0.90)

## 2023-05-05 LAB — VITAMIN D 25 HYDROXY (VIT D DEFICIENCY, FRACTURES): Vit D, 25-Hydroxy: 12.2 ng/mL — ABNORMAL LOW (ref 30.0–100.0)

## 2023-05-06 ENCOUNTER — Encounter: Payer: Self-pay | Admitting: Nurse Practitioner

## 2023-05-06 LAB — NUSWAB VAGINITIS PLUS (VG+)
Candida albicans, NAA: POSITIVE — AB
Candida glabrata, NAA: NEGATIVE
Chlamydia trachomatis, NAA: NEGATIVE
Neisseria gonorrhoeae, NAA: NEGATIVE
Trich vag by NAA: NEGATIVE

## 2023-05-19 ENCOUNTER — Ambulatory Visit: Payer: Self-pay

## 2023-05-19 DIAGNOSIS — R197 Diarrhea, unspecified: Secondary | ICD-10-CM | POA: Insufficient documentation

## 2023-05-19 DIAGNOSIS — R110 Nausea: Secondary | ICD-10-CM | POA: Insufficient documentation

## 2023-05-19 DIAGNOSIS — R051 Acute cough: Secondary | ICD-10-CM | POA: Insufficient documentation

## 2023-05-19 DIAGNOSIS — Z2821 Immunization not carried out because of patient refusal: Secondary | ICD-10-CM | POA: Insufficient documentation

## 2023-05-19 DIAGNOSIS — N898 Other specified noninflammatory disorders of vagina: Secondary | ICD-10-CM | POA: Insufficient documentation

## 2023-05-19 DIAGNOSIS — Z111 Encounter for screening for respiratory tuberculosis: Secondary | ICD-10-CM | POA: Insufficient documentation

## 2023-05-19 DIAGNOSIS — E559 Vitamin D deficiency, unspecified: Secondary | ICD-10-CM | POA: Insufficient documentation

## 2023-05-19 DIAGNOSIS — Z0000 Encounter for general adult medical examination without abnormal findings: Secondary | ICD-10-CM | POA: Insufficient documentation

## 2023-05-19 MED ORDER — FLUCONAZOLE 150 MG PO TABS: ORAL_TABLET | ORAL | 0 refills | Status: DC

## 2023-05-19 MED ORDER — VITAMIN D (ERGOCALCIFEROL) 1.25 MG (50000 UNIT) PO CAPS
ORAL_CAPSULE | ORAL | 0 refills | Status: DC
Start: 2023-05-19 — End: 2023-10-08

## 2023-05-19 NOTE — Assessment & Plan Note (Signed)
Blood pressure is well controlled, continue current medications EKG done

## 2023-05-19 NOTE — Assessment & Plan Note (Signed)
Depression score is down to 11 from 21.

## 2023-05-19 NOTE — Assessment & Plan Note (Signed)
Continue statin, tolerating medications well.

## 2023-05-19 NOTE — Assessment & Plan Note (Addendum)
Patient declined influenza vaccination at this time. Patient is aware that influenza vaccine prevents illness in 70% of healthy people, and reduces hospitalizations to 30-70% in elderly. This vaccine is recommended annually. Education has been provided regarding the importance of this vaccine but patient still declined. Advised may receive this vaccine at local pharmacy or Health Dept.or vaccine clinic. Aware to provide a copy of the vaccination record if obtained from local pharmacy or Health Dept.  Pt is willing to accept risk associated with refusing vaccination. Will return in 2 weeks for NV influenza

## 2023-05-19 NOTE — Assessment & Plan Note (Signed)
Chronic, stable. Continue f/u with Endocrinology Continue with current medications Encouraged to limit intake of sugary foods and drinks Encouraged to increase physical activity to 150 minutes per week Diabetic foot exam done, no abnormal findings Eye exam is up to date Suggestion is made for her to avoid simple carbohydrates from her diet including Cakes, Sweet Desserts, Ice Cream, Soda (diet and regular), Sweet Tea, Candies, Chips, Cookies, Store Bought Juices, Alcohol in Excess of 1-2 drinks a day, Artificial Sweeteners, Coffee Creamer, and "Sugar-free" Products. This will help patient to have more stable blood glucose

## 2023-05-19 NOTE — Assessment & Plan Note (Signed)
She is to get a sample for any bacteria due to her diarrhea. Avoid increased fiber foods.

## 2023-05-19 NOTE — Assessment & Plan Note (Addendum)
Declines covid 19 vaccine. Discussed risk of covid 43 and if she changes her mind about the vaccine to call the office. Education has been provided regarding the importance of this vaccine but patient still declined. Advised may receive this vaccine at local pharmacy or Health Dept.or vaccine clinic. Aware to provide a copy of the vaccination record if obtained from local pharmacy or Health Dept.  Encouraged to take multivitamin, vitamin d, vitamin c and zinc to increase immune system. Aware can call office if would like to have vaccine here at office. Verbalized acceptance and understanding. Will return in 2 weeks for NV covid

## 2023-05-19 NOTE — Assessment & Plan Note (Addendum)
Likely related to her diabetes however will check for STDs

## 2023-05-19 NOTE — Assessment & Plan Note (Signed)
Behavior modifications discussed and diet history reviewed.   Pt will continue to exercise regularly and modify diet with low GI, plant based foods and decrease intake of processed foods.  Recommend intake of daily multivitamin, Vitamin D, and calcium.  Recommend mammogram for preventive screenings, as well as recommend immunizations that include influenza, TDAP

## 2023-05-19 NOTE — Assessment & Plan Note (Signed)
Will check vitamin D level and supplement as needed.    Also encouraged to spend 15 minutes in the sun daily.   

## 2023-05-19 NOTE — Assessment & Plan Note (Signed)
Negative rapid covid and influenza. She is to take HBP coricidan brand medications.

## 2023-05-22 ENCOUNTER — Encounter: Payer: Self-pay | Admitting: Nurse Practitioner

## 2023-05-22 ENCOUNTER — Ambulatory Visit (INDEPENDENT_AMBULATORY_CARE_PROVIDER_SITE_OTHER): Payer: Medicaid Other

## 2023-05-22 VITALS — BP 128/86 | HR 98 | Temp 98.1°F | Ht 65.0 in | Wt 305.0 lb

## 2023-05-22 DIAGNOSIS — Z23 Encounter for immunization: Secondary | ICD-10-CM

## 2023-05-22 NOTE — Progress Notes (Signed)
Patient presents today for flu shot. She reports she will come back for work. Denies any cold like symptoms.

## 2023-05-22 NOTE — Patient Instructions (Signed)

## 2023-06-26 ENCOUNTER — Telehealth: Payer: Self-pay | Admitting: Internal Medicine

## 2023-06-26 DIAGNOSIS — E119 Type 2 diabetes mellitus without complications: Secondary | ICD-10-CM

## 2023-06-26 MED ORDER — DAPAGLIFLOZIN PROPANEDIOL 10 MG PO TABS: 10.0000 mg | ORAL_TABLET | Freq: Every day | ORAL | 1 refills | Status: DC

## 2023-06-26 MED ORDER — TRESIBA FLEXTOUCH 100 UNIT/ML ~~LOC~~ SOPN: 40.0000 [IU] | PEN_INJECTOR | Freq: Every day | SUBCUTANEOUS | 1 refills | Status: DC

## 2023-06-26 MED ORDER — OZEMPIC (2 MG/DOSE) 8 MG/3ML ~~LOC~~ SOPN: 2.0000 mg | PEN_INJECTOR | SUBCUTANEOUS | Status: DC

## 2023-06-26 NOTE — Telephone Encounter (Addendum)
MEDICATION:  1)   Evaristo Bury FlexTouch insulin degludec (TRESIBA FLEXTOUCH) 100 UNIT/ML FlexTouch Pen  2) dapagliflozin propanediol (FARXIGA) 10 MG TABS tablet [[id number]]   3)  Ozempic (2 MG/DOSE) Semaglutide, 2 MG/DOSE, (OZEMPIC, 2 MG/DOSE,) 8 MG/3ML SOPN  PHARMACY:    [id number] - [place], [place] - [address] (Ph: [phone])    HAS THE PATIENT CONTACTED THEIR PHARMACY?  Yes  IS THIS A 90 DAY SUPPLY : Yes  IS PATIENT OUT OF MEDICATION: Yes  IF NOT; HOW MUCH IS LEFT:   LAST APPOINTMENT DATE: @6 /02/2023  NEXT APPOINTMENT DATE:@2 /26/2025 (Put on waitlist)  DO WE HAVE YOUR PERMISSION TO LEAVE A DETAILED MESSAGE?:  OTHER COMMENTS: Patient wants to know if we have samples of these due to out of pocket cost also   **Let patient know to contact pharmacy at the end of the day to make sure medication is ready. **  ** Please notify patient to allow 48-72 hours to process**  **Encourage patient to contact the pharmacy for refills or they can request refills through [facility]**

## 2023-06-30 ENCOUNTER — Other Ambulatory Visit: Payer: Self-pay | Admitting: Nurse Practitioner

## 2023-06-30 DIAGNOSIS — I10 Essential (primary) hypertension: Secondary | ICD-10-CM

## 2023-07-01 ENCOUNTER — Other Ambulatory Visit: Payer: Self-pay

## 2023-07-01 DIAGNOSIS — I10 Essential (primary) hypertension: Secondary | ICD-10-CM

## 2023-07-01 MED ORDER — LOSARTAN POTASSIUM-HCTZ 50-12.5 MG PO TABS: 1.0000 | ORAL_TABLET | Freq: Every day | ORAL | 0 refills | Status: DC

## 2023-08-28 ENCOUNTER — Encounter: Payer: Self-pay | Admitting: Internal Medicine

## 2023-08-28 ENCOUNTER — Ambulatory Visit (INDEPENDENT_AMBULATORY_CARE_PROVIDER_SITE_OTHER): Payer: No Typology Code available for payment source | Admitting: Internal Medicine

## 2023-08-28 ENCOUNTER — Telehealth: Payer: Self-pay

## 2023-08-28 VITALS — BP 128/80 | HR 112 | Ht 65.0 in | Wt 295.0 lb

## 2023-08-28 DIAGNOSIS — E1129 Type 2 diabetes mellitus with other diabetic kidney complication: Secondary | ICD-10-CM | POA: Insufficient documentation

## 2023-08-28 DIAGNOSIS — Z794 Long term (current) use of insulin: Secondary | ICD-10-CM

## 2023-08-28 DIAGNOSIS — E1165 Type 2 diabetes mellitus with hyperglycemia: Secondary | ICD-10-CM | POA: Insufficient documentation

## 2023-08-28 DIAGNOSIS — R6889 Other general symptoms and signs: Secondary | ICD-10-CM | POA: Diagnosis not present

## 2023-08-28 DIAGNOSIS — R809 Proteinuria, unspecified: Secondary | ICD-10-CM | POA: Insufficient documentation

## 2023-08-28 LAB — POCT GLYCOSYLATED HEMOGLOBIN (HGB A1C): Hemoglobin A1C: 9.3 % — AB (ref 4.0–5.6)

## 2023-08-28 LAB — POCT GLUCOSE (DEVICE FOR HOME USE): POC Glucose: 112 mg/dL — AB (ref 70–99)

## 2023-08-28 MED ORDER — TIRZEPATIDE 2.5 MG/0.5ML ~~LOC~~ SOAJ: 2.5000 mg | SUBCUTANEOUS | 0 refills | Status: DC

## 2023-08-28 MED ORDER — INSULIN PEN NEEDLE 31G X 8 MM MISC: 1.0000 | Freq: Every day | 2 refills | Status: DC

## 2023-08-28 MED ORDER — TRESIBA FLEXTOUCH 100 UNIT/ML ~~LOC~~ SOPN: 40.0000 [IU] | PEN_INJECTOR | Freq: Every day | SUBCUTANEOUS | 2 refills | Status: DC

## 2023-08-28 MED ORDER — DAPAGLIFLOZIN PROPANEDIOL 10 MG PO TABS: 10.0000 mg | ORAL_TABLET | Freq: Every day | ORAL | 2 refills | Status: DC

## 2023-08-28 MED ORDER — METFORMIN HCL ER 750 MG PO TB24: 1500.0000 mg | ORAL_TABLET | Freq: Every day | ORAL | 2 refills | Status: DC

## 2023-08-28 MED ORDER — TIRZEPATIDE 5 MG/0.5ML ~~LOC~~ SOAJ: 5.0000 mg | SUBCUTANEOUS | 3 refills | Status: DC

## 2023-08-28 NOTE — Telephone Encounter (Signed)
Patient will stop by and pick up sample of Mounjaro. It has been labeled and in the fridge.   Medication Samples have been provided to the patient.  Drug name: Greggory Keen       Strength: 2.5mg         Qty: 1 box   LOT: [id number]  Exp.Date: 03/29/25  Dosing instructions: inject once weekly   The patient has been instructed regarding the correct time, dose, and frequency of taking this medication, including desired effects and most common side effects.   Jamye Balicki L Simrit Gohlke 3:26 PM 08/28/2023

## 2023-08-28 NOTE — Patient Instructions (Addendum)
-   Continue Tresiba  40 units daily  - Continue  Farxiga  10 mg, 1 tablet in the morning  - Continue Metformin 750 mg, 2 tablets daily  - Start Mounjaro 2.5 mg weekly for 4 weeks, than increase to 5 mg weekly   SALIVARY CORTISOL COLLECTION INSTRUCTIONS    Precautions:  Please collect sample at Midnight . You will need to do this on 2 nights  No food or fluids 30 minutes prior to collection.  Do not use any creams, lotions on hands, or use steroid inhalers 24- hours prior to collection.  Wash hands carefully.  Avoid any activity that could cause your gums to bleed: including flushing of brushing your teeth.  Kit must not be used in children less than 68 years of age, or a person that is at risk for choking on collection kit.  Instructions for saliva collection:   Rinse mouth thoroughly with water and discard. Do not swallow.  Hold the Salivette at the rim of the suspended insert and remove the stopper.  Remove the swab.  Place swab under tongue until well saturated, approximately 1 minute.   Return the saturated swab to the suspended insert and close the Salivette firmly with the stopper.  Do not remove the tube holding the insert. The Salivette should be sent to the lab with the swab.   Come to the lab and leave the Salivette kit for labeling with your identifying information.   Make sure you refrigerate sample if not bringing to the lab immediately. Try to use cold packs for transportation if available.       HOW TO TREAT LOW BLOOD SUGARS (Blood sugar LESS THAN 70 MG/DL) Please follow the RULE OF 15 for the treatment of hypoglycemia treatment (when your (blood sugars are less than 70 mg/dL)   STEP 1: Take 15 grams of carbohydrates when your blood sugar is low, which includes:  3-4 GLUCOSE TABS  OR 3-4 OZ OF JUICE OR REGULAR SODA OR ONE TUBE OF GLUCOSE GEL    STEP 2: RECHECK blood sugar in 15 MINUTES STEP 3: If your blood sugar is still low at the 15 minute recheck --> then,  go back to STEP 1 and treat AGAIN with another 15 grams of carbohydrates.

## 2023-08-28 NOTE — Progress Notes (Signed)
Name: [name]  MRN/ DOB: [id number], 02-11-1981   Age/ Sex: 43 y.o., female    PCP: Arnette Felts, FNP   Reason for Endocrinology Evaluation: Type 2 Diabetes Mellitus     Date of Initial Endocrinology Visit: 11/30/2020    PATIENT IDENTIFIER: [name] is a 43 y.o. female with a past medical history of T2DM, OSA, Dyslipidemia and HTN. The patient presented for initial endocrinology clinic visit on 11/30/2020 for consultative assistance with her diabetes management.    HPI: [name] was    Diagnosed with gestational diabetes in 2012  And never resolved after delivery Hemoglobin A1c has ranged from 7.1% in 2021, peaking at 11.1 in 2022.  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  She was lost to follow-up for approximately 18 months before her return to our clinic again.  SUBJECTIVE:   During the last visit (01/09/2023):  m this was a virtual visit  Today (08/28/23): [name] is here for follow-up on diabetes management.  She checks her blood sugars 0 times daily.    She has not been on Ozempic ~ 1 month due to lack of health for insurance, but now she has new insurance  The patient does endorse vomiting with overeating She had  constipation this week , resolved with miralax  Bedtime 9 pm   HOME DIABETES REGIMEN: Tresiba 40 units daily  Metformin 750 mg XR 2 tabs daily  Ozempic 2 mg weekly - not taking  Farxiga 10 mg daily     Statin: yes ACE-I/ARB: yes Prior Diabetic Education: yes   METER DOWNLOAD SUMMARY: Did not bring    DIABETIC COMPLICATIONS: Microvascular complications:  Denies: CKD,  Neuropathy, retinopathy  Last eye exam: Completed 2021  Macrovascular complications:   Denies: CAD, PVD, CVA   PAST HISTORY: Past Medical History:  Past Medical History:  Diagnosis Date   Abnormal Pap smear     f/u wnl   Asthma    Fibroid    Gestational diabetes    Hypertension    Morbid obesity (HCC)    Sleep apnea    Urinary tract infection    Past Surgical History:  Past Surgical History:  Procedure Laterality Date   BREAST BIOPSY Right 07/24/2022   [place] RT BREAST BX W LOC DEV 1ST LESION IMG BX SPEC US GUIDE 07/24/2022 [facility]   CESAREAN SECTION     DILATION AND CURETTAGE OF UTERUS     TUBAL LIGATION      Social History:  reports that she quit smoking about 4 years ago. Her smoking use included cigarettes. She has quit using smokeless tobacco. She reports that she does not drink alcohol and does not use drugs. Family History:  Family History  Problem Relation Age of Onset   Diabetes Father    Allergies Mother    Asthma Mother    Hypertension Mother    Diabetes Mother    Diabetes Maternal Grandmother    Heart disease Maternal Grandmother    Anesthesia problems Neg Hx    Hypotension Neg Hx    Malignant hyperthermia Neg Hx    Pseudochol deficiency Neg Hx    Hearing loss Neg Hx      HOME MEDICATIONS: Allergies as of 08/28/2023       Reactions   Other Anaphylaxis, Other (See Comments)  Pt has fish allergy Pt has allergy to butter cookies   Penicillins Rash, Other (See Comments)   Told MD that PCN reaction is rash and that she has taken amoxicillin before   Bactrim [sulfamethoxazole-trimethoprim]    Diarrhea, abdominal cramps and rash   Fish Allergy    Other reaction(s): Unknown        Medication List        Accurate as of August 28, 2023  2:59 PM. If you have any questions, ask your nurse or doctor.          STOP taking these medications    Ozempic (2 MG/DOSE) 8 MG/3ML Sopn Generic drug: Semaglutide (2 MG/DOSE) Stopped by: Johnney Ou Towanna Avery       TAKE these medications    Accu-Chek Guide test strip Generic drug: glucose blood 1 each by Other route daily in the afternoon. Use as instructed   Accu-Chek Guide w/Device Kit 1 Device by  Does not apply route daily in the afternoon.   accu-chek multiclix lancets USE TO CHECK BLOOD SUGARS UP TO 4 TIMES DAILY  DX CODE:E11.9   albuterol (5 MG/ML) 0.5% nebulizer solution Commonly known as: PROVENTIL Take 0.5 mLs (2.5 mg total) by nebulization every 6 (six) hours as needed for wheezing or shortness of breath.   albuterol 108 (90 Base) MCG/ACT inhaler Commonly known as: VENTOLIN HFA Inhale 1-2 puffs into the lungs every 6 (six) hours as needed for wheezing or shortness of breath.   atorvastatin 20 MG tablet Commonly known as: LIPITOR TAKE 1 TABLET BY MOUTH EVERY DAY   blood glucose meter kit and supplies Kit Dispense based on patient and insurance preference. Use up to four times daily as directed. (FOR ICD-9 250.00, 250.01).   cefdinir 300 MG capsule Commonly known as: OMNICEF Take 1 capsule (300 mg total) by mouth 2 (two) times daily.   cetirizine 10 MG tablet Commonly known as: ZyrTEC Allergy Take 1 tablet (10 mg total) by mouth daily.   cyclobenzaprine 10 MG tablet Commonly known as: FLEXERIL Take 1 tablet (10 mg total) by mouth 3 (three) times daily as needed for muscle spasms.   dapagliflozin propanediol 10 MG Tabs tablet Commonly known as: Farxiga Take 1 tablet (10 mg total) by mouth daily.   ferrous sulfate 325 (65 FE) MG EC tablet Take 1 tablet (325 mg total) by mouth 3 (three) times daily with meals.   fluconazole 150 MG tablet Commonly known as: Diflucan Take one tablet by mouth as a single dose. May repeat in 3 days if symptoms persist.   fluticasone 50 MCG/ACT nasal spray Commonly known as: FLONASE Place 1-2 sprays into both nostrils daily.   hydrOXYzine 25 MG tablet Commonly known as: ATARAX Take 1 tablet (25 mg total) by mouth every 6 (six) hours.   Insulin Pen Needle 31G X 8 MM Misc 1 Device by Does not apply route daily.   levonorgestrel 20 MCG/24HR IUD Commonly known as: MIRENA 1 each by Intrauterine route once.    losartan-hydrochlorothiazide 50-12.5 MG tablet Commonly known as: HYZAAR Take 1 tablet by mouth daily.   melatonin 5 MG Tabs Take 1 tablet (5 mg total) by mouth at bedtime as needed.   metFORMIN 750 MG 24 hr tablet Commonly known as: GLUCOPHAGE-XR Take 2 tablets (1,500 mg total) by mouth daily with breakfast.   multivitamin capsule Take 1 capsule by mouth daily.   promethazine-dextromethorphan 6.25-15 MG/5ML syrup Commonly known as: PROMETHAZINE-DM Take 5 mLs by mouth 4 (four) times daily  as needed for cough.   propranolol 10 MG tablet Commonly known as: INDERAL TAKE 1 TABLET(10 MG) BY MOUTH EVERY EVENING. No refills until pt is seen.   sertraline 50 MG tablet Commonly known as: ZOLOFT TAKE 1 TABLET BY MOUTH EVERY DAY   terconazole 0.4 % vaginal cream Commonly known as: TERAZOL 7 Place 1 applicator vaginally at bedtime.   tirzepatide 2.5 MG/0.5ML Pen Commonly known as: MOUNJARO Inject 2.5 mg into the skin once a week. Started by: Johnney Ou Aviyah Swetz   tirzepatide 5 MG/0.5ML Pen Commonly known as: MOUNJARO Inject 5 mg into the skin once a week. Started by: Scarlette Shorts   Evaristo Bury FlexTouch 100 UNIT/ML FlexTouch Pen Generic drug: insulin degludec Inject 40 Units into the skin daily.   triamcinolone 0.025 % ointment Commonly known as: KENALOG Apply 1 application topically 2 (two) times daily.   Vitamin D (Ergocalciferol) 1.25 MG (50000 UNIT) Caps capsule Commonly known as: DRISDOL Take 1 capsule by mouth on Tues and on Fridays.         ALLERGIES: Allergies  Allergen Reactions   Other Anaphylaxis and Other (See Comments)    Pt has fish allergy Pt has allergy to butter cookies   Penicillins Rash and Other (See Comments)    Told MD that PCN reaction is rash and that she has taken amoxicillin before   Bactrim [Sulfamethoxazole-Trimethoprim]     Diarrhea, abdominal cramps and rash   Fish Allergy     Other reaction(s): Unknown         OBJECTIVE:   VITAL SIGNS: BP 128/80 (BP Location: Left Arm, Patient Position: Sitting, Cuff Size: Normal)   Pulse (!) 112   Ht 5\' 5"  (1.651 m)   Wt 295 lb (133.8 kg)   SpO2 98%   BMI 49.09 kg/m    PHYSICAL EXAM:  General: Pt appears well and is in NAD  Lungs: Clear with good BS B/L  Heart: RRR   Extremities:  Lower extremities - No pretibial edema  Neuro: MS is good with appropriate affect, pt is alert and Ox3    DM foot exam: 08/28/2023  The skin of the feet is intact without sores or ulcerations. The pedal pulses are 2+ on right and 2+ on left. The sensation is intact to a screening 5.07, 10 gram monofilament bilaterally      DATA REVIEWED:  Lab Results  Component Value Date   HGBA1C 9.3 (A) 08/28/2023   HGBA1C 10.9 (H) 05/04/2023   HGBA1C 6.2 (A) 05/29/2022    Latest Reference Range & Units 05/04/23 11:38  Sodium 134 - 144 mmol/L 135  Potassium 3.5 - 5.2 mmol/L 4.2  Chloride 96 - 106 mmol/L 97  CO2 20 - 29 mmol/L 23  Glucose 70 - 99 mg/dL 161 (H)  BUN 6 - 24 mg/dL 11  Creatinine 0.96 - 0.45 mg/dL 4.09  Calcium 8.7 - 81.1 mg/dL 9.7  BUN/Creatinine Ratio 9 - 23  16  eGFR >59 mL/min/1.73 111  Alkaline Phosphatase 44 - 121 IU/L 107  Albumin 3.9 - 4.9 g/dL 4.0  AST 0 - 40 IU/L 32  ALT 0 - 32 IU/L 39 (H)  Total Protein 6.0 - 8.5 g/dL 7.3  Total Bilirubin 0.0 - 1.2 mg/dL 0.5  Total CHOL/HDL Ratio 0.0 - 4.4 ratio 4.6 (H)  Cholesterol, Total 100 - 199 mg/dL 914 (H)  HDL Cholesterol >39 mg/dL 46  MICROALB/CREAT RATIO 0 - 29 mg/g creat 328 (H)  Triglycerides 0 - 149 mg/dL 782 (H)  VLDL Cholesterol Cal 5 - 40 mg/dL 30  LDL Chol Calc (NIH) 0 - 99 mg/dL 161 (H)  (H): Data is abnormally high   ASSESSMENT / PLAN / RECOMMENDATIONS:   1) Type 2 Diabetes Mellitus, Poorly  controlled, With microalbuminuria complications - Most recent A1c of 9.3 %. Goal A1c < 7.0 %.    -Patient with worsening glycemic control -Patient states she has been without Ozempic for  approximately a month due to lack of health insurance but she did have enough Tresiba, and Farxiga  -I have recommended Mounjaro, she now has insurance  MEDICATIONS: -Continue tresiba to 40 units daily  -Continue Farxiga 10 mg, 1 tablet in the morning  -Continue Metformin 750 mg, 1 tablet twice daily  -Start Mounjaro 2.5 mg weekly for 4 weeks, then increase to 5 mg weekly   EDUCATION / INSTRUCTIONS: BG monitoring instructions: Patient is instructed to check her blood sugars 1 times a day, fasting. Call [facility] if: BG persistently < 70  I reviewed the Rule of 15 for the treatment of hypoglycemia in detail with the patient. Literature supplied.   2) Diabetic complications:  Eye: Does not have known diabetic retinopathy.  Neuro/ Feet: Does not have known diabetic peripheral neuropathy. Renal: Patient does not have known baseline CKD. She is on an ACEI/ARB at present.   3) Cushignoid Features:  -Will proceed with saliva cortisol testing x 2  4) Microalbuminuria  -Discussed the importance of optimizing glucose control and compliance with losartan -I explained to the patient that microalbuminuria is the first sign that there is renal diabetes damage -Will monitor    F/U in 3 months     Signed electronically by: Lyndle Herrlich, MD  [facility]  [facility] [address] [place], [place] [address] Phone: [phone] FAX: [phone]   CC: Arnette Felts, FNP [address] [place] [address] Phone: [phone]  Fax: [phone]    Return to Endocrinology clinic as below: Future Appointments  Date Time Provider Department Center  11/02/2023  8:20 AM Arnette Felts, FNP [facility] None  11/27/2023  1:40 PM Terri Malerba, Konrad Dolores, MD [facility] None  05/05/2024 10:00 AM Arnette Felts, FNP [facility] None

## 2023-09-22 IMAGING — CT CT RENAL STONE PROTOCOL
2 of 4 series · 17 of 46 positions shown, 19 images · non-contrast
Comparison: 12/15/2014 CT abdomen pelvis

CLINICAL DATA: Right flank pain, kidney stone suspected, hematuria

EXAM:
CT ABDOMEN AND PELVIS WITHOUT CONTRAST
TECHNIQUE: Multidetector CT imaging of the abdomen and pelvis was performed
following the standard protocol without IV contrast.

[Series 2: axial st · axial · 0.98mm/px · z∈[-591,-106]mm · 14 of 107 slices shown, 16 images]
[im 5/107  soft-tissue]
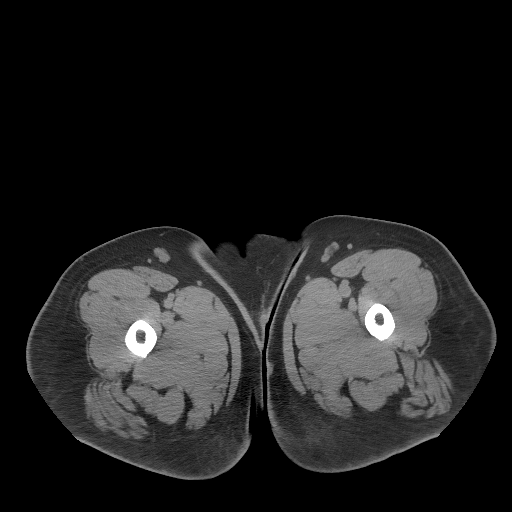
[im 5/107  bone]
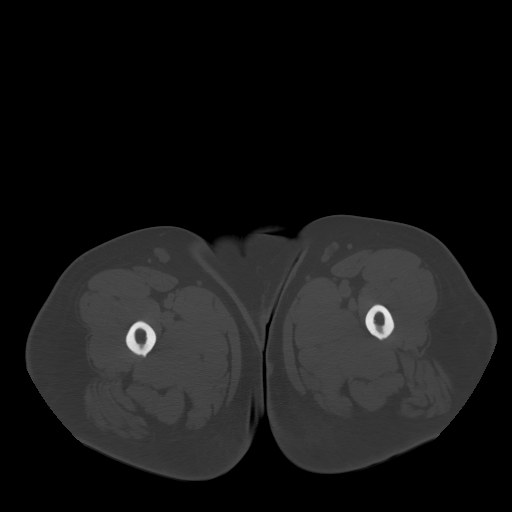
[im 13/107  soft-tissue]
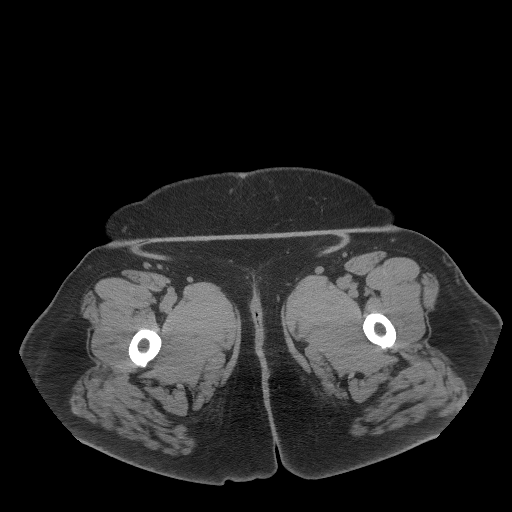
[im 21/107  soft-tissue]
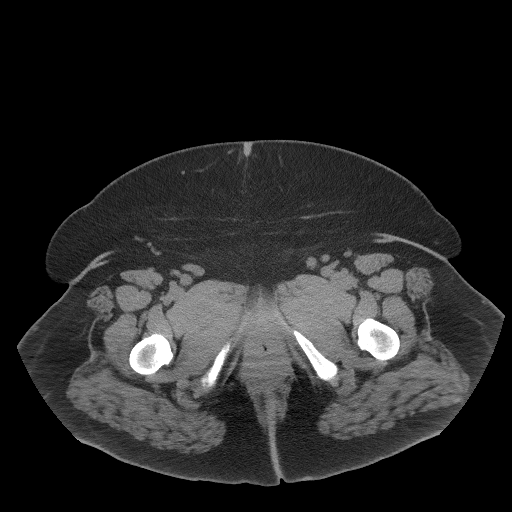
[im 29/107  soft-tissue]
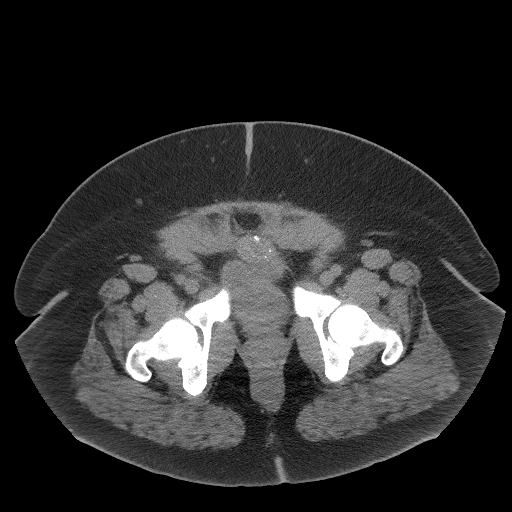
[im 37/107  soft-tissue]
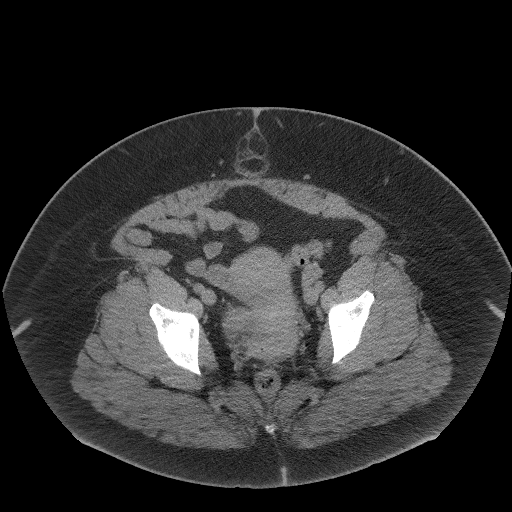
[im 41/107  soft-tissue]
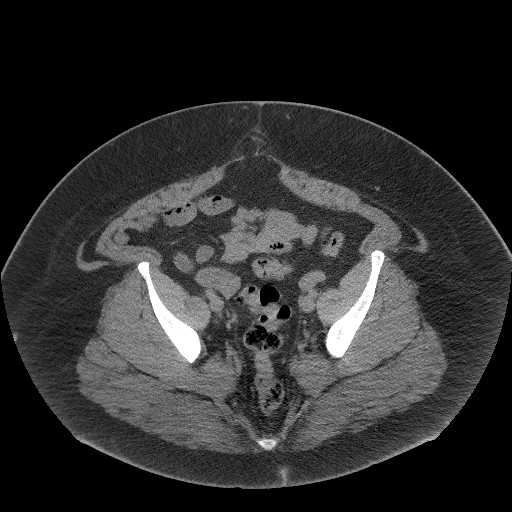
[im 49/107  soft-tissue]
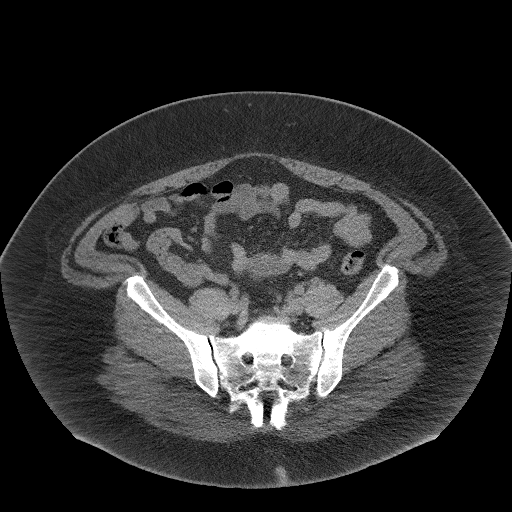
[im 58/107  soft-tissue]
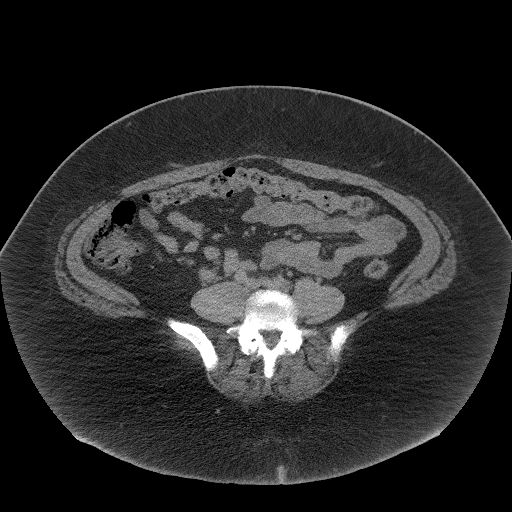
[im 66/107  soft-tissue]
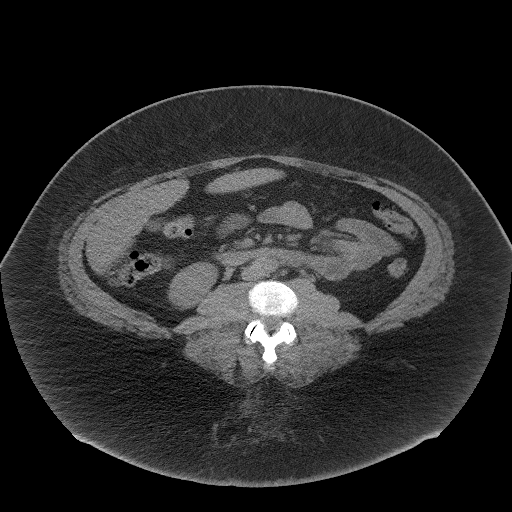
[im 66/107  bone]
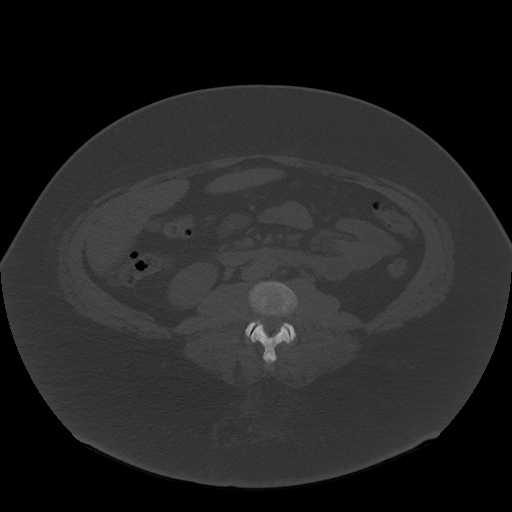
[im 70/107  soft-tissue]
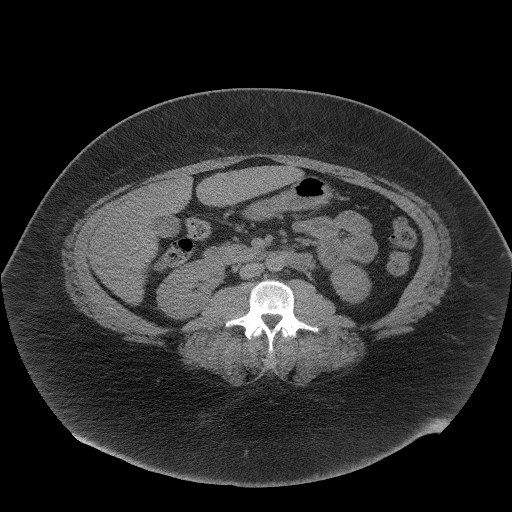
[im 78/107  soft-tissue]
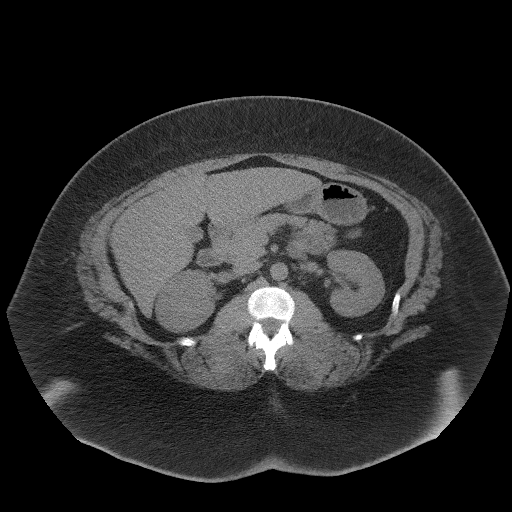
[im 86/107  soft-tissue]
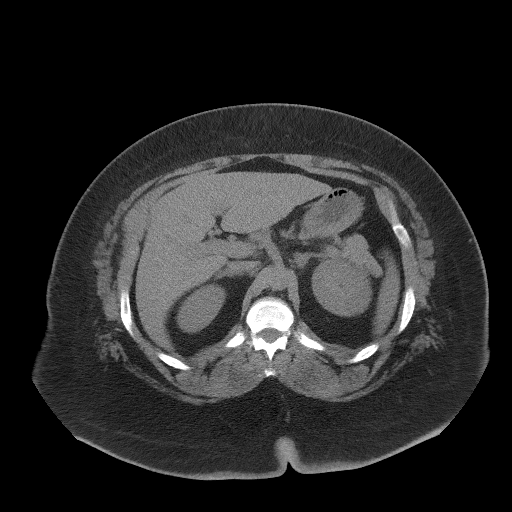
[im 94/107  soft-tissue]
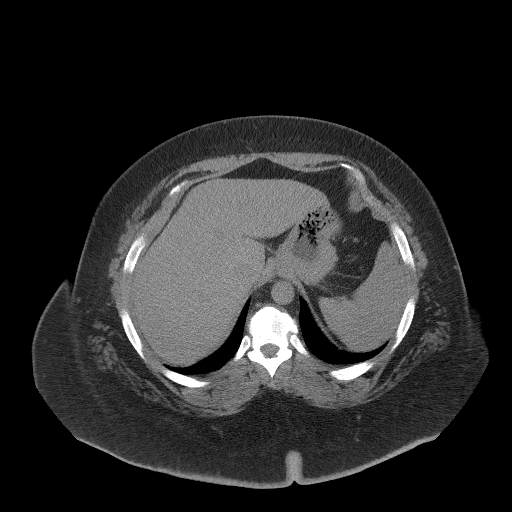
[im 102/107  soft-tissue]
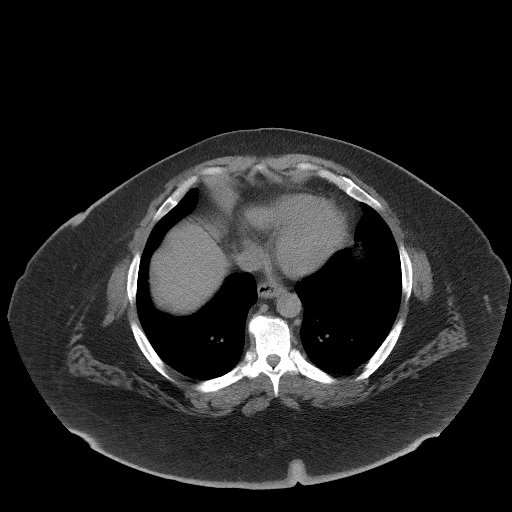

[Series 5: coronal st · coronal · 1.13mm/px · 3 of 132 slices shown]
[im 44/132  soft-tissue]
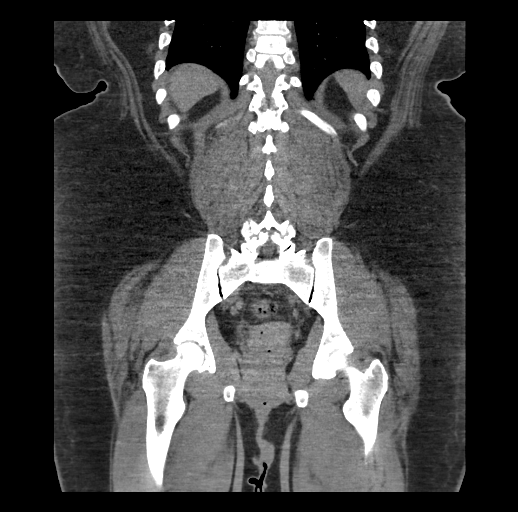
[im 59/132  soft-tissue]
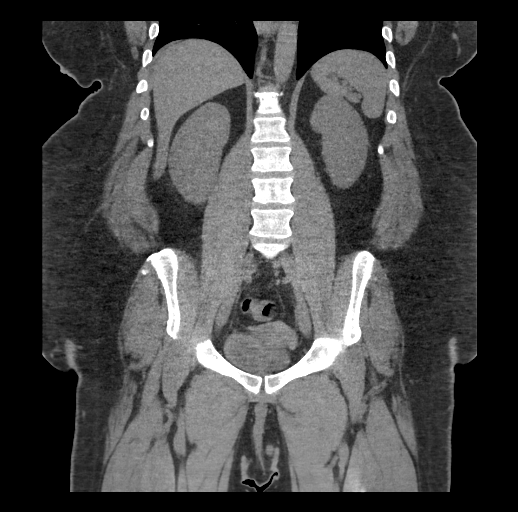
[im 73/132  soft-tissue]
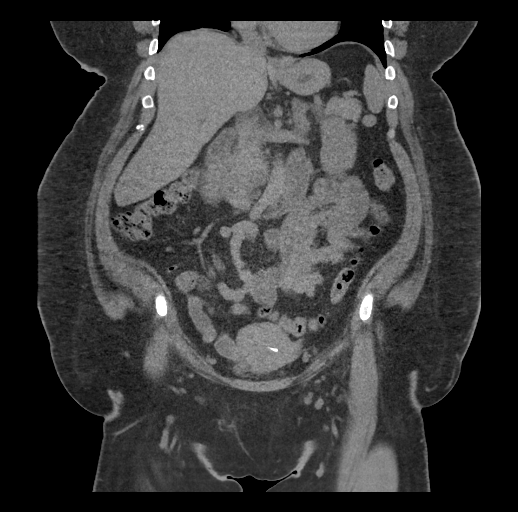

[17 of 46 positions shown; findings below may reference images not displayed]

FINDINGS: Lower chest: No acute abnormality.

Hepatobiliary: No focal liver abnormality is seen. No gallstones,
gallbladder wall thickening, or biliary dilatation.

Pancreas: Unremarkable. No pancreatic ductal dilatation or
surrounding inflammatory changes.

Spleen: Normal in size without focal abnormality.  Splenule.

Adrenals/Urinary Tract: Unchanged mild thickening of the left
adrenal gland. The adrenal glands are otherwise unremarkable.
Kidneys are symmetric in size with no hydronephrosis or
nephrolithiasis. The ureters and bladder are unremarkable.

Stomach/Bowel: Small hiatal hernia. Stomach is otherwise normal.
Appendix appears normal. No evidence of bowel wall thickening,
distention, or inflammatory changes.

Vascular/Lymphatic: No significant vascular findings are present. No
enlarged abdominal or pelvic lymph nodes.

Reproductive: Redemonstrated calcified pedunculated fibroid at the
anterior aspect of the uterus. An IUD is noted. The bilateral adnexa
are unremarkable.

Other: Small fat containing periumbilical hernia. No free air or
free fluid in the abdomen or pelvis.

Musculoskeletal: No acute osseous abnormality.
IMPRESSION: 1. No evidence of hydronephrosis, nephrolithiasis, or urolithiasis.
No etiology is seen for the patient's flank pain.
2. No acute process in the abdomen or pelvis.

## 2023-09-30 ENCOUNTER — Ambulatory Visit: Payer: Medicaid Other | Admitting: Internal Medicine

## 2023-10-08 ENCOUNTER — Encounter: Payer: Self-pay | Admitting: Family Medicine

## 2023-10-08 ENCOUNTER — Ambulatory Visit (INDEPENDENT_AMBULATORY_CARE_PROVIDER_SITE_OTHER): Admitting: Family Medicine

## 2023-10-08 ENCOUNTER — Ambulatory Visit: Payer: Self-pay | Admitting: Nurse Practitioner

## 2023-10-08 VITALS — BP 124/80 | HR 105 | Temp 98.4°F | Wt 303.6 lb

## 2023-10-08 DIAGNOSIS — J069 Acute upper respiratory infection, unspecified: Secondary | ICD-10-CM

## 2023-10-08 DIAGNOSIS — R059 Cough, unspecified: Secondary | ICD-10-CM | POA: Diagnosis not present

## 2023-10-08 LAB — POCT INFLUENZA A/B
Influenza A, POC: NEGATIVE
Influenza B, POC: NEGATIVE

## 2023-10-08 LAB — POC COVID19 BINAXNOW: SARS Coronavirus 2 Ag: NEGATIVE

## 2023-10-08 MED ORDER — PREDNISONE 10 MG PO TABS: ORAL_TABLET | ORAL | 0 refills | Status: AC

## 2023-10-08 NOTE — Patient Instructions (Addendum)
-  Negative for covid and influenza. -Diagnosed with an viral upper respiratory infection based on symptoms and physical exam. Provided upper respiratory infection information.  -Rest, hydrate. Excuse work not provided with a return date of 10/12/2023.  -Continue to use over the medication that you are currently using. -Prescribed Prednisone 10mg  tablet, 6 day taper. Discussed about increasing water intake and decreasing carbohydrates while taking this medication since she is a diabetic.  -Follow up with PCP if not improved.

## 2023-10-08 NOTE — Progress Notes (Signed)
 Acute Office Visit   Subjective:  Patient ID: [name], female    DOB: 1981-07-23, 43 y.o.   MRN: [id number]  Chief Complaint  Patient presents with   Sinusitis    Patient complains of sinusitis, x3 days, Tried Tylenol and Mucinex    Cough    Patient complains of cough, x3 days, Productive    Shortness of Breath    Patient complains of shortness of breath, x3 days     Sinusitis Associated symptoms include coughing and shortness of breath.  Cough Associated symptoms include shortness of breath.  Shortness of Breath   Patient is being seen for an acute visit. She has been experiencing the following symptoms:  Cough-productive, light yellow, thin  SHOB Sinus pressure  Nasal drainage  Sore throat  Chest tightness  Wheezing  Headache  Ear pain- bilateral  Body aches  Sweating   Denies chest pain or fever.   Symptoms started 3 days ago.   She has tried Tylenol, Tylenol Cold/Flu and Mucinex with some relief. Also, been using Albuterol Inhaler.   Review of Systems  Respiratory:  Positive for cough and shortness of breath.    See HPI above      Objective:   BP 124/80 (BP Location: Left Arm, Patient Position: Sitting, Cuff Size: Large)   Pulse (!) 105   Temp 98.4 F (36.9 C) (Oral)   Wt (!) 303 lb 9.6 oz (137.7 kg)   SpO2 95%   BMI 50.52 kg/m    Physical Exam Vitals reviewed.  Constitutional:      General: She is not in acute distress.    Appearance: Normal appearance. She is obese. She is not ill-appearing, toxic-appearing or diaphoretic.  HENT:     Head: Normocephalic and atraumatic.     Right Ear: Tympanic membrane, ear canal and external ear normal. There is no impacted cerumen.     Left Ear: Tympanic membrane, ear canal and external ear normal. There is no impacted cerumen.     Nose:     Right Sinus: Maxillary sinus tenderness present. No frontal sinus tenderness.     Left Sinus: Maxillary sinus tenderness present. No frontal sinus tenderness.      Mouth/Throat:     Pharynx: Oropharynx is clear. Uvula midline. No pharyngeal swelling, oropharyngeal exudate, posterior oropharyngeal erythema or uvula swelling.  Eyes:     General:        Right eye: No discharge.        Left eye: No discharge.     Conjunctiva/sclera: Conjunctivae normal.  Cardiovascular:     Rate and Rhythm: Normal rate and regular rhythm.     Heart sounds: Normal heart sounds. No murmur heard.    No friction rub. No gallop.  Pulmonary:     Effort: Pulmonary effort is normal. No respiratory distress.     Breath sounds: Examination of the right-upper field reveals wheezing. Examination of the left-upper field reveals wheezing. Wheezing (Posterior) present.  Musculoskeletal:        General: Normal range of motion.  Skin:    General: Skin is warm and dry.  Neurological:     General: No focal deficit present.     Mental Status: She is alert and oriented to person, place, and time. Mental status is at baseline.  Psychiatric:        Mood and Affect: Mood normal.        Behavior: Behavior normal.        Thought Content: Thought  content normal.        Judgment: Judgment normal.    Results for orders placed or performed in visit on 10/08/23  POC COVID-19  Result Value Ref Range   SARS Coronavirus 2 Ag Negative Negative  POC Influenza A/B  Result Value Ref Range   Influenza A, POC Negative Negative   Influenza B, POC Negative Negative      Assessment & Plan:  Cough, unspecified type -     POC COVID-19 BinaxNow -     POCT Influenza A/B  Viral upper respiratory tract infection -     predniSONE; Take 6 tablets (60 mg total) by mouth daily with breakfast for 1 day, THEN 5 tablets (50 mg total) daily with breakfast for 1 day, THEN 4 tablets (40 mg total) daily with breakfast for 1 day, THEN 3 tablets (30 mg total) daily with breakfast for 1 day, THEN 2 tablets (20 mg total) daily with breakfast for 1 day, THEN 1 tablet (10 mg total) daily with breakfast for 1 day.   Dispense: 21 tablet; Refill: 0  -Negative for covid and influenza. -Diagnosed with an viral upper respiratory infection based on symptoms and physical exam. Provided upper respiratory infection information.  -Rest, hydrate. Excuse work not provided with a return date of 10/12/2023.  -Continue to use over the medication that you are currently using. -Prescribed Prednisone 10mg  tablet, 6 day taper for the [facility], wheezing, and chest tightness. Discussed about increasing water intake and decreasing carbohydrates while taking this medication since she is a diabetic.  -Follow up with PCP if not improved.   Zandra Abts, NP

## 2023-10-08 NOTE — Telephone Encounter (Addendum)
  Chief Complaint: SOB with exertion Symptoms: sinus pressure, H/A, drainage Frequency: Monday Pertinent Negatives: Patient denies fever, CP Disposition: [] ED /[] Urgent Care (no appt availability in office) / [x] Appointment(In office/virtual)/ []  [facility]/ [] Home Care/ [] Refused Recommended Disposition /[] [facility]/ []  Follow-up with PCP Additional Notes: Pt c/o SOB r/t sinus pressure/drainage. Pt reports that she usually gets URI annually this time of year. Endorses that she has been using inhaler 3x daily with relief of SOB, but is persistent and worsened by sinus drainage, H/A. Denies fever, CP. Scheduled patient per protocol on 10/08/2023 at alternate [facility]. Patient verbalized understanding and to call back with worsening symptoms.     Copied from CRM [id number]. Topic: Clinical - Red Word Triage >> Oct 08, 2023  9:27 AM Gildardo Pounds wrote: Red Word that prompted transfer to Nurse Triage: drainage, shortness of breath, headache. getting worse. OTC medication is not working. Callback number is [phone] Reason for Disposition  [1] MILD difficulty breathing (e.g., minimal/no SOB at rest, SOB with walking, pulse <100) AND [2] NEW-onset or WORSE than normal  Answer Assessment - Initial Assessment Questions 1. RESPIRATORY STATUS: "Describe your breathing?" (e.g., wheezing, shortness of breath, unable to speak, severe coughing)      SOB 2. ONSET: "When did this breathing problem begin?"      Monday 3. PATTERN "Does the difficult breathing come and go, or has it been constant since it started?"      intermittent 4. SEVERITY: "How bad is your breathing?" (e.g., mild, moderate, severe)    - MILD: No SOB at rest, mild SOB with walking, speaks normally in sentences, can lie down, no retractions, pulse < 100.    - MODERATE: SOB at rest, SOB with minimal exertion and prefers to sit, cannot lie down flat, speaks in phrases, mild retractions, audible wheezing, pulse 100-120.    -  SEVERE: Very SOB at rest, speaks in single words, struggling to breathe, sitting hunched forward, retractions, pulse > 120      Mild-mod 5. RECURRENT SYMPTOM: "Have you had difficulty breathing before?" If Yes, ask: "When was the last time?" and "What happened that time?"      Usually gets URI every year during this yeah 6. CARDIAC HISTORY: "Do you have any history of heart disease?" (e.g., heart attack, angina, bypass surgery, angioplasty)      denies 7. LUNG HISTORY: "Do you have any history of lung disease?"  (e.g., pulmonary embolus, asthma, emphysema)     Asthma - has been using inhaler 3x daily 8. CAUSE: "What do you think is causing the breathing problem?"      URI 9. OTHER SYMPTOMS: "Do you have any other symptoms? (e.g., dizziness, runny nose, cough, chest pain, fever)     Sinus H/A Productive sputum-yellow/green 12. TRAVEL: "Have you traveled out of the country in the last month?" (e.g., travel history, exposures)       no  Protocols used: Breathing Difficulty-A-AH

## 2023-10-09 ENCOUNTER — Telehealth: Payer: Self-pay

## 2023-10-09 MED ORDER — SEMAGLUTIDE(0.25 OR 0.5MG/DOS) 2 MG/3ML ~~LOC~~ SOPN: 0.5000 mg | PEN_INJECTOR | SUBCUTANEOUS | 3 refills | Status: DC

## 2023-10-09 MED ORDER — LANTUS SOLOSTAR 100 UNIT/ML ~~LOC~~ SOPN: 40.0000 [IU] | PEN_INJECTOR | Freq: Every day | SUBCUTANEOUS | 3 refills | Status: DC

## 2023-10-09 NOTE — Telephone Encounter (Signed)
 Patient states that the insurance is not covering [place] or [place].

## 2023-10-28 ENCOUNTER — Other Ambulatory Visit: Payer: Self-pay | Admitting: Family Medicine

## 2023-10-28 DIAGNOSIS — F418 Other specified anxiety disorders: Secondary | ICD-10-CM

## 2023-11-02 ENCOUNTER — Encounter: Payer: Self-pay | Admitting: Nurse Practitioner

## 2023-11-02 ENCOUNTER — Ambulatory Visit (INDEPENDENT_AMBULATORY_CARE_PROVIDER_SITE_OTHER): Payer: Self-pay | Admitting: Nurse Practitioner

## 2023-11-02 VITALS — BP 136/88 | HR 95 | Temp 99.6°F | Ht 65.0 in | Wt 302.8 lb

## 2023-11-02 DIAGNOSIS — I10 Essential (primary) hypertension: Secondary | ICD-10-CM | POA: Diagnosis not present

## 2023-11-02 DIAGNOSIS — G44319 Acute post-traumatic headache, not intractable: Secondary | ICD-10-CM

## 2023-11-02 DIAGNOSIS — E1169 Type 2 diabetes mellitus with other specified complication: Secondary | ICD-10-CM | POA: Diagnosis not present

## 2023-11-02 DIAGNOSIS — Z794 Long term (current) use of insulin: Secondary | ICD-10-CM

## 2023-11-02 DIAGNOSIS — E782 Mixed hyperlipidemia: Secondary | ICD-10-CM | POA: Diagnosis not present

## 2023-11-02 DIAGNOSIS — E66813 Obesity, class 3: Secondary | ICD-10-CM

## 2023-11-02 DIAGNOSIS — E119 Type 2 diabetes mellitus without complications: Secondary | ICD-10-CM

## 2023-11-02 DIAGNOSIS — Z6841 Body Mass Index (BMI) 40.0 and over, adult: Secondary | ICD-10-CM

## 2023-11-02 DIAGNOSIS — F418 Other specified anxiety disorders: Secondary | ICD-10-CM | POA: Diagnosis not present

## 2023-11-02 DIAGNOSIS — Z79899 Other long term (current) drug therapy: Secondary | ICD-10-CM

## 2023-11-02 MED ORDER — CETIRIZINE HCL 10 MG PO TABS: 10.0000 mg | ORAL_TABLET | Freq: Every day | ORAL | 2 refills | Status: DC

## 2023-11-02 MED ORDER — SERTRALINE HCL 50 MG PO TABS: ORAL_TABLET | ORAL | 1 refills | Status: DC

## 2023-11-02 MED ORDER — LOSARTAN POTASSIUM-HCTZ 50-12.5 MG PO TABS: 1.0000 | ORAL_TABLET | Freq: Every day | ORAL | 0 refills | Status: DC

## 2023-11-02 NOTE — Patient Instructions (Addendum)
 Hypertension, Adult Hypertension is another name for high blood pressure. High blood pressure forces your heart to work harder to pump blood. This can cause problems over time. There are two numbers in a blood pressure reading. There is a top number (systolic) over a bottom number (diastolic). It is best to have a blood pressure that is below 120/80. What are the causes? The cause of this condition is not known. Some other conditions can lead to high blood pressure. What increases the risk? Some lifestyle factors can make you more likely to develop high blood pressure: Smoking. Not getting enough exercise or physical activity. Being overweight. Having too much fat, sugar, calories, or salt (sodium) in your diet. Drinking too much alcohol. Other risk factors include: Having any of these conditions: Heart disease. Diabetes. High cholesterol. Kidney disease. Obstructive sleep apnea. Having a family history of high blood pressure and high cholesterol. Age. The risk increases with age. Stress. What are the signs or symptoms? High blood pressure may not cause symptoms. Very high blood pressure (hypertensive crisis) may cause: Headache. Fast or uneven heartbeats (palpitations). Shortness of breath. Nosebleed. Vomiting or feeling like you may vomit (nauseous). Changes in how you see. Very bad chest pain. Feeling dizzy. Seizures. How is this treated? This condition is treated by making healthy lifestyle changes, such as: Eating healthy foods. Exercising more. Drinking less alcohol. Your doctor may prescribe medicine if lifestyle changes do not help enough and if: Your top number is above 130. Your bottom number is above 80. Your personal target blood pressure may vary. Follow these instructions at home: Eating and drinking  If told, follow the DASH eating plan. To follow this plan: Fill one half of your plate at each meal with fruits and vegetables. Fill one fourth of your plate  at each meal with whole grains. Whole grains include whole-wheat pasta, brown rice, and whole-grain bread. Eat or drink low-fat dairy products, such as skim milk or low-fat yogurt. Fill one fourth of your plate at each meal with low-fat (lean) proteins. Low-fat proteins include fish, chicken without skin, eggs, beans, and tofu. Avoid fatty meat, cured and processed meat, or chicken with skin. Avoid pre-made or processed food. Limit the amount of salt in your diet to less than 1,500 mg each day. Do not drink alcohol if: Your doctor tells you not to drink. You are pregnant, may be pregnant, or are planning to become pregnant. If you drink alcohol: Limit how much you have to: 0-1 drink a day for women. 0-2 drinks a day for men. Know how much alcohol is in your drink. In the [place] one drink equals one 12 oz bottle of beer (355 mL), one 5 oz glass of wine (148 mL), or one 1 oz glass of hard liquor (44 mL). Lifestyle  Work with your doctor to stay at a healthy weight or to lose weight. Ask your doctor what the best weight is for you. Get at least 30 minutes of exercise that causes your heart to beat faster (aerobic exercise) most days of the week. This may include walking, swimming, or biking. Get at least 30 minutes of exercise that strengthens your muscles (resistance exercise) at least 3 days a week. This may include lifting weights or doing Pilates. Do not smoke or use any products that contain nicotine or tobacco. If you need help quitting, ask your doctor. Check your blood pressure at home as told by your doctor. Keep all follow-up visits. Medicines Take over-the-counter and prescription medicines  only as told by your doctor. Follow directions carefully. Do not skip doses of blood pressure medicine. The medicine does not work as well if you skip doses. Skipping doses also puts you at risk for problems. Ask your doctor about side effects or reactions to medicines that you should watch  for. Contact a doctor if: You think you are having a reaction to the medicine you are taking. You have headaches that keep coming back. You feel dizzy. You have swelling in your ankles. You have trouble with your vision. Get help right away if: You get a very bad headache. You start to feel mixed up (confused). You feel weak or numb. You feel faint. You have very bad pain in your: Chest. Belly (abdomen). You vomit more than once. You have trouble breathing. These symptoms may be an emergency. Get help right away. Call 911. Do not wait to see if the symptoms will go away. Do not drive yourself to the hospital. Summary Hypertension is another name for high blood pressure. High blood pressure forces your heart to work harder to pump blood. For most people, a normal blood pressure is less than 120/80. Making healthy choices can help lower blood pressure. If your blood pressure does not get lower with healthy choices, you may need to take medicine. This information is not intended to replace advice given to you by your health care provider. Make sure you discuss any questions you have with your health care provider Contact your insurance company to see what is covered for your diabetes and contact Dr. Herold Harms office Document Revised: 05/09/2021 Document Reviewed: 05/09/2021 Elsevier Patient Education  2024 Elsevier Inc.
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
## 2023-11-03 ENCOUNTER — Telehealth: Payer: Self-pay | Admitting: Pharmacy Technician

## 2023-11-03 ENCOUNTER — Encounter: Payer: Self-pay | Admitting: Nurse Practitioner

## 2023-11-03 ENCOUNTER — Other Ambulatory Visit (HOSPITAL_BASED_OUTPATIENT_CLINIC_OR_DEPARTMENT_OTHER): Payer: Self-pay

## 2023-11-03 ENCOUNTER — Other Ambulatory Visit: Payer: Self-pay | Admitting: Nurse Practitioner

## 2023-11-03 ENCOUNTER — Other Ambulatory Visit (HOSPITAL_COMMUNITY): Payer: Self-pay

## 2023-11-03 DIAGNOSIS — E782 Mixed hyperlipidemia: Secondary | ICD-10-CM

## 2023-11-03 DIAGNOSIS — E1169 Type 2 diabetes mellitus with other specified complication: Secondary | ICD-10-CM

## 2023-11-03 LAB — LIPID PANEL
Chol/HDL Ratio: 4 ratio (ref 0.0–4.4)
Cholesterol, Total: 191 mg/dL (ref 100–199)
HDL: 48 mg/dL (ref >39)
LDL Chol Calc (NIH): 119 mg/dL — ABNORMAL HIGH (ref 0–99)
Triglycerides: 134 mg/dL (ref 0–149)
VLDL Cholesterol Cal: 24 mg/dL (ref 5–40)

## 2023-11-03 LAB — CMP14+EGFR
ALT: 57 IU/L — ABNORMAL HIGH (ref 0–32)
AST: 45 IU/L — ABNORMAL HIGH (ref 0–40)
Albumin: 3.8 g/dL — ABNORMAL LOW (ref 3.9–4.9)
Alkaline Phosphatase: 254 IU/L — ABNORMAL HIGH (ref 44–121)
BUN/Creatinine Ratio: 18 (ref 9–23)
BUN: 15 mg/dL (ref 6–24)
Bilirubin Total: 0.5 mg/dL (ref 0.0–1.2)
CO2: 22 mmol/L (ref 20–29)
Calcium: 10.8 mg/dL — ABNORMAL HIGH (ref 8.7–10.2)
Chloride: 95 mmol/L — ABNORMAL LOW (ref 96–106)
Creatinine, Ser: 0.82 mg/dL (ref 0.57–1.00)
Globulin, Total: 3.7 g/dL (ref 1.5–4.5)
Glucose: 161 mg/dL — ABNORMAL HIGH (ref 70–99)
Potassium: 4.6 mmol/L (ref 3.5–5.2)
Sodium: 136 mmol/L (ref 134–144)
Total Protein: 7.5 g/dL (ref 6.0–8.5)
eGFR: 92 mL/min/{1.73_m2} (ref >59)

## 2023-11-03 LAB — TSH: TSH: 2.03 u[IU]/mL (ref 0.450–4.500)

## 2023-11-03 MED ORDER — INSULIN GLARGINE-YFGN 100 UNIT/ML ~~LOC~~ SOPN
40.0000 [IU] | PEN_INJECTOR | Freq: Every day | SUBCUTANEOUS | 3 refills | Status: DC
  Filled 2023-11-03: qty 45, 112d supply, fill #0

## 2023-11-03 MED ORDER — ATORVASTATIN CALCIUM 20 MG PO TABS
20.0000 mg | ORAL_TABLET | Freq: Every day | ORAL | 1 refills | Status: DC
  Filled 2023-11-03 – 2024-02-19 (×2): qty 30, 30d supply, fill #0

## 2023-11-03 NOTE — Telephone Encounter (Signed)
 Pharmacy Patient Advocate Encounter   Received notification from CoverMyMeds that prior authorization for Ozempic (0.25 or 0.5 MG/DOSE) 2MG /3ML pen-injectors is required/requested.   Insurance verification completed.   The patient is insured through [facility] .   Per test claim: PA required; PA submitted to above mentioned insurance via CoverMyMeds Key/confirmation #/EOC [employer] Status is pending

## 2023-11-04 ENCOUNTER — Other Ambulatory Visit (HOSPITAL_COMMUNITY): Payer: Self-pay

## 2023-11-04 ENCOUNTER — Telehealth: Payer: Self-pay | Admitting: Pharmacy Technician

## 2023-11-04 ENCOUNTER — Other Ambulatory Visit (HOSPITAL_BASED_OUTPATIENT_CLINIC_OR_DEPARTMENT_OTHER): Payer: Self-pay

## 2023-11-04 MED ORDER — TRULICITY 1.5 MG/0.5ML ~~LOC~~ SOAJ
1.5000 mg | SUBCUTANEOUS | 3 refills | Status: DC
  Filled 2023-11-04: qty 2, 28d supply, fill #0
  Filled 2023-12-03: qty 2, 28d supply, fill #1
  Filled 2023-12-30: qty 2, 28d supply, fill #2
  Filled 2023-12-30: qty 2, 28d supply, fill #0
  Filled 2024-02-19: qty 2, 28d supply, fill #1

## 2023-11-04 NOTE — Telephone Encounter (Signed)
 Pharmacy Patient Advocate Encounter  Received notification from [employer] that Prior Authorization for Ozempic (0.25 or 0.5 MG/DOSE) 2MG /3ML pen-injectors has been DENIED.  Full denial letter will be uploaded to the media tab. See denial reason below.   PA #/Case ID/Reference #: [id number]

## 2023-11-04 NOTE — Telephone Encounter (Signed)
   The test claim says Plan exclusion, not on formulary. Please advise.

## 2023-11-04 NOTE — Telephone Encounter (Signed)
 We received paperwork that the [facility] was on formulary

## 2023-11-04 NOTE — Telephone Encounter (Signed)
 Patient made aware of auth decision and medication regimen change. No further questions at this time.

## 2023-11-04 NOTE — Telephone Encounter (Signed)
 Pharmacy Patient Advocate Encounter   Received notification from CoverMyMeds that prior authorization for Semglee (yfgn) 100UNIT/ML pen-injectors is required/requested.   Insurance verification completed.   The patient is insured through [facility] .   Per test claim:  [name] is preferred by the insurance.  If suggested medication is appropriate, Please send in a new RX and discontinue this one. If not, please advise as to why it's not appropriate so that we may request a Prior Authorization. Please note, some preferred medications may still require a PA.  If the suggested medications have not been trialed and there are no contraindications to their use, the PA will not be submitted, as it will not be approved.

## 2023-11-05 ENCOUNTER — Other Ambulatory Visit (HOSPITAL_BASED_OUTPATIENT_CLINIC_OR_DEPARTMENT_OTHER): Payer: Self-pay

## 2023-11-05 ENCOUNTER — Other Ambulatory Visit: Payer: Self-pay | Admitting: Internal Medicine

## 2023-11-05 MED ORDER — BASAGLAR KWIKPEN 100 UNIT/ML ~~LOC~~ SOPN
40.0000 [IU] | PEN_INJECTOR | Freq: Every day | SUBCUTANEOUS | 3 refills | Status: DC
  Filled 2023-11-05 – 2023-12-30 (×2): qty 45, 112d supply, fill #0
  Filled 2023-12-30: qty 45, 112d supply, fill #1
  Filled 2024-02-19: qty 45, 112d supply, fill #0

## 2023-11-10 ENCOUNTER — Ambulatory Visit
Admission: RE | Admit: 2023-11-10 | Discharge: 2023-11-10 | Disposition: A | Discharge status: Home / Self Care | Source: Ambulatory Visit | Attending: Nurse Practitioner | Admitting: Nurse Practitioner

## 2023-11-10 ENCOUNTER — Other Ambulatory Visit (HOSPITAL_BASED_OUTPATIENT_CLINIC_OR_DEPARTMENT_OTHER): Payer: Self-pay

## 2023-11-10 DIAGNOSIS — G44319 Acute post-traumatic headache, not intractable: Secondary | ICD-10-CM

## 2023-11-12 DIAGNOSIS — G44319 Acute post-traumatic headache, not intractable: Secondary | ICD-10-CM | POA: Insufficient documentation

## 2023-11-12 DIAGNOSIS — E66813 Obesity, class 3: Secondary | ICD-10-CM | POA: Insufficient documentation
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
## 2023-11-12 NOTE — Assessment & Plan Note (Signed)
Continue statin, tolerating medications well.

## 2023-11-12 NOTE — Assessment & Plan Note (Signed)
 Depression score is down to 10, continue current medications.

## 2023-11-12 NOTE — Assessment & Plan Note (Signed)
 Chronic, stable. Continue f/u with Endocrinology Continue with current medications Encouraged to limit intake of sugary foods and drinks Encouraged to increase physical activity to 150 minutes per week I have advised her to contact Endocrinology to have her medications sent again with the new insurance.
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
## 2023-11-12 NOTE — Assessment & Plan Note (Signed)
 Blood pressure is elevated, slightly improved with repeat. She is advised to take her medications as directed.

## 2023-11-17 ENCOUNTER — Encounter: Payer: Self-pay | Admitting: Nurse Practitioner

## 2023-11-27 ENCOUNTER — Ambulatory Visit: Payer: Self-pay | Admitting: Internal Medicine

## 2023-12-02 ENCOUNTER — Ambulatory Visit: Payer: Self-pay | Admitting: Internal Medicine

## 2023-12-08 ENCOUNTER — Other Ambulatory Visit: Payer: Self-pay

## 2023-12-14 ENCOUNTER — Ambulatory Visit: Admitting: Internal Medicine

## 2023-12-14 NOTE — Progress Notes (Deleted)
 Name: [name]  MRN/ DOB: [id number], [birth year]   Age/ Sex: 43 y.o., female    PCP: Susanna Epley, FNP   Reason for Endocrinology Evaluation: Type 2 Diabetes Mellitus     Date of Initial Endocrinology Visit: 11/30/2020    PATIENT IDENTIFIER: [name] is a 43 y.o. female with a past medical history of T2DM, OSA, Dyslipidemia and HTN. The patient presented for initial endocrinology clinic visit on 11/30/2020 for consultative assistance with her diabetes management.    HPI: [name] was    Diagnosed with gestational diabetes in 2012  And never resolved after delivery Hemoglobin A1c has ranged from 7.1% in 2021, peaking at 11.1 in 2022.  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  She was lost to follow-up for approximately 18 months before her return to our clinic again.  Attempted to switch Ozempic  to Mounjaro 08/2023 with an A1c of 9.3%, but her insurance would only cover Trulicity   SUBJECTIVE:   During the last visit (08/28/2023): A1c 9.3%  Today (12/14/23): [name] is here for follow-up on diabetes management.  She checks her blood sugars 0 times daily.    She has not been on Ozempic  ~ 1 month due to lack of health for insurance, but now she has new insurance  The patient does endorse vomiting with overeating She had  constipation this week , resolved with miralax  Bedtime 9 pm   HOME DIABETES REGIMEN: Metformin  750 mg XR 2 tabs daily  Farxiga  10 mg daily Trulicity  1.5 mg weekly Basaglar  40 units daily    Statin: yes ACE-I/ARB: yes Prior Diabetic Education: yes   METER DOWNLOAD SUMMARY: Did not bring    DIABETIC COMPLICATIONS: Microvascular complications:  Denies: CKD,  Neuropathy, retinopathy  Last eye exam: Completed 2021  Macrovascular complications:   Denies: CAD, PVD, CVA   PAST  HISTORY: Past Medical History:  Past Medical History:  Diagnosis Date   Abnormal Pap smear    f/u wnl   Asthma    Fibroid    Gestational diabetes    Hypertension    Morbid obesity (HCC)    Sleep apnea    Urinary tract infection    Past Surgical History:  Past Surgical History:  Procedure Laterality Date   BREAST BIOPSY Right 07/24/2022   US  RT BREAST BX W LOC DEV 1ST LESION IMG BX SPEC US  GUIDE 07/24/2022 [facility]   CESAREAN SECTION     DILATION AND CURETTAGE OF UTERUS     TUBAL LIGATION      Social History:  reports that she quit smoking about 4 years ago. Her smoking use included cigarettes. She has quit using smokeless tobacco. She reports that she does not drink alcohol and does not use drugs. Family History:  Family History  Problem Relation Age of Onset   Diabetes Father    Allergies Mother    Asthma Mother    Hypertension Mother    Diabetes Mother    Diabetes Maternal Grandmother    Heart disease Maternal Grandmother    Anesthesia problems Neg Hx    Hypotension Neg Hx    Malignant hyperthermia Neg Hx    Pseudochol deficiency Neg Hx    Hearing loss Neg Hx      HOME MEDICATIONS: Allergies as of 12/14/2023  Reactions   Other Anaphylaxis, Other (See Comments)   Pt has fish allergy Pt has allergy to butter cookies   Penicillins Rash, Other (See Comments)   Told MD that PCN reaction is rash and that she has taken amoxicillin  before   Bactrim  [sulfamethoxazole -trimethoprim ]    Diarrhea, abdominal cramps and rash   Fish Allergy    Other reaction(s): Unknown        Medication List        Accurate as of Dec 14, 2023  6:56 AM. If you have any questions, ask your nurse or doctor.          Accu-Chek Guide test strip Generic drug: glucose blood 1 each by Other route daily in the afternoon. Use as instructed   Accu-Chek Guide w/Device Kit 1 Device by Does not apply route daily in the afternoon.   accu-chek multiclix lancets USE TO  CHECK BLOOD SUGARS UP TO 4 TIMES DAILY  DX CODE:E11.9   albuterol  (5 MG/ML) 0.5% nebulizer solution Commonly known as: PROVENTIL  Take 0.5 mLs (2.5 mg total) by nebulization every 6 (six) hours as needed for wheezing or shortness of breath.   albuterol  108 (90 Base) MCG/ACT inhaler Commonly known as: VENTOLIN  HFA Inhale 1-2 puffs into the lungs every 6 (six) hours as needed for wheezing or shortness of breath.   atorvastatin  20 MG tablet Commonly known as: LIPITOR Take 1 tablet (20 mg total) by mouth daily.   Basaglar  KwikPen 100 UNIT/ML Inject 40 Units into the skin daily.   blood glucose meter kit and supplies Kit Dispense based on patient and insurance preference. Use up to four times daily as directed. (FOR ICD-9 250.00, 250.01).   cetirizine  10 MG tablet Commonly known as: ZyrTEC  Allergy Take 1 tablet (10 mg total) by mouth daily.   dapagliflozin  propanediol 10 MG Tabs tablet Commonly known as: Farxiga  Take 1 tablet (10 mg total) by mouth daily.   ferrous sulfate  325 (65 FE) MG EC tablet Take 1 tablet (325 mg total) by mouth 3 (three) times daily with meals.   fluticasone  50 MCG/ACT nasal spray Commonly known as: FLONASE  Place 1-2 sprays into both nostrils daily.   Insulin  Pen Needle 31G X 8 MM Misc 1 Device by Does not apply route daily.   levonorgestrel  20 MCG/24HR IUD Commonly known as: MIRENA  1 each by Intrauterine route once.   losartan -hydrochlorothiazide  50-12.5 MG tablet Commonly known as: HYZAAR Take 1 tablet by mouth daily.   metFORMIN  750 MG 24 hr tablet Commonly known as: GLUCOPHAGE -XR Take 2 tablets (1,500 mg total) by mouth daily with breakfast.   multivitamin capsule Take 1 capsule by mouth daily.   propranolol  10 MG tablet Commonly known as: INDERAL  TAKE 1 TABLET(10 MG) BY MOUTH EVERY EVENING. No refills until pt is seen.   sertraline  50 MG tablet Commonly known as: ZOLOFT  Take 1 1/2 tablet by mouth daily   terconazole  0.4 % vaginal  cream Commonly known as: TERAZOL 7  Place 1 applicator vaginally at bedtime.   triamcinolone  0.025 % ointment Commonly known as: KENALOG  Apply 1 application topically 2 (two) times daily.   Trulicity  1.5 MG/0.5ML Soaj Generic drug: Dulaglutide  Inject 1.5 mg into the skin once a week.         ALLERGIES: Allergies  Allergen Reactions   Other Anaphylaxis and Other (See Comments)    Pt has fish allergy Pt has allergy to butter cookies   Penicillins Rash and Other (See Comments)    Told MD that PCN reaction  is rash and that she has taken amoxicillin  before   Bactrim  [Sulfamethoxazole -Trimethoprim ]     Diarrhea, abdominal cramps and rash   Fish Allergy     Other reaction(s): Unknown        OBJECTIVE:   VITAL SIGNS: There were no vitals taken for this visit.   PHYSICAL EXAM:  General: Pt appears well and is in NAD  Lungs: Clear with good BS B/L  Heart: RRR   Extremities:  Lower extremities - No pretibial edema  Neuro: MS is good with appropriate affect, pt is alert and Ox3    DM foot exam: 08/28/2023  The skin of the feet is intact without sores or ulcerations. The pedal pulses are 2+ on right and 2+ on left. The sensation is intact to a screening 5.07, 10 gram monofilament bilaterally      DATA REVIEWED:  Lab Results  Component Value Date   HGBA1C 9.3 (A) 08/28/2023   HGBA1C 10.9 (H) 05/04/2023   HGBA1C 6.2 (A) 05/29/2022    Latest Reference Range & Units 11/02/23 09:33  Sodium 134 - 144 mmol/L 136  Potassium 3.5 - 5.2 mmol/L 4.6  Chloride 96 - 106 mmol/L 95 (L)  CO2 20 - 29 mmol/L 22  Glucose 70 - 99 mg/dL 130 (H)  BUN 6 - 24 mg/dL 15  Creatinine 8.65 - 7.84 mg/dL 6.96  Calcium  8.7 - 10.2 mg/dL 29.5 (H)  BUN/Creatinine Ratio 9 - 23  18  eGFR >59 mL/min/1.73 92  Alkaline Phosphatase 44 - 121 IU/L 254 (H)  Albumin 3.9 - 4.9 g/dL 3.8 (L)  AST 0 - 40 IU/L 45 (H)  ALT 0 - 32 IU/L 57 (H)  Total Protein 6.0 - 8.5 g/dL 7.5  Total Bilirubin 0.0 - 1.2  mg/dL 0.5  Total CHOL/HDL Ratio 0.0 - 4.4 ratio 4.0  Cholesterol, Total 100 - 199 mg/dL 284  HDL Cholesterol >13 mg/dL 48  Triglycerides 0 - 244 mg/dL 010  VLDL Cholesterol Cal 5 - 40 mg/dL 24  LDL Chol Calc (NIH) 0 - 99 mg/dL 272 (H)  (L): Data is abnormally low (H): Data is abnormally high  ASSESSMENT / PLAN / RECOMMENDATIONS:   1) Type 2 Diabetes Mellitus, Poorly  controlled, With microalbuminuria complications - Most recent A1c of 9.3 %. Goal A1c < 7.0 %.    -Patient with worsening glycemic control -Patient states she has been without Ozempic  for approximately a month due to lack of health insurance but she did have enough Tresiba , and Farxiga   -I have recommended Mounjaro, she now has insurance  MEDICATIONS: -Continue tresiba  to 40 units daily  -Continue Farxiga  10 mg, 1 tablet in the morning  -Continue Metformin  750 mg, 1 tablet twice daily  -Start Mounjaro 2.5 mg weekly for 4 weeks, then increase to 5 mg weekly   EDUCATION / INSTRUCTIONS: BG monitoring instructions: Patient is instructed to check her blood sugars 1 times a day, fasting. Call [facility] if: BG persistently < 70  I reviewed the Rule of 15 for the treatment of hypoglycemia in detail with the patient. Literature supplied.   2) Diabetic complications:  Eye: Does not have known diabetic retinopathy.  Neuro/ Feet: Does not have known diabetic peripheral neuropathy. Renal: Patient does not have known baseline CKD. She is on an ACEI/ARB at present.    3) Microalbuminuria  -Discussed the importance of optimizing glucose control and compliance with losartan  -I explained to the patient that microalbuminuria is the first sign that there is renal  diabetes damage -Will monitor    F/U in 3 months     Signed electronically by: Natale Bail, MD  [facility]  [facility] [address] [place], [place] [address] Phone: [phone] FAX:  [phone]   CC: Susanna Epley, FNP [address] [place] [address] Phone: [phone]  Fax: [phone]    Return to Endocrinology clinic as below: Future Appointments  Date Time Provider Department Center  12/14/2023  9:10 AM Mareena Cavan, Julian Obey, MD [facility] None  02/03/2024  9:00 AM Susanna Epley, FNP [facility] None  05/05/2024 10:00 AM Susanna Epley, FNP [facility] None

## 2023-12-30 ENCOUNTER — Other Ambulatory Visit (HOSPITAL_COMMUNITY): Payer: Self-pay

## 2023-12-30 ENCOUNTER — Other Ambulatory Visit: Payer: Self-pay

## 2023-12-31 ENCOUNTER — Other Ambulatory Visit: Payer: Self-pay

## 2024-01-14 ENCOUNTER — Other Ambulatory Visit: Payer: Self-pay | Admitting: Nurse Practitioner

## 2024-01-14 DIAGNOSIS — I10 Essential (primary) hypertension: Secondary | ICD-10-CM

## 2024-02-03 ENCOUNTER — Ambulatory Visit: Admitting: Nurse Practitioner

## 2024-02-04 ENCOUNTER — Ambulatory Visit: Admitting: Nurse Practitioner

## 2024-02-19 ENCOUNTER — Other Ambulatory Visit: Payer: Self-pay

## 2024-02-25 ENCOUNTER — Ambulatory Visit (INDEPENDENT_AMBULATORY_CARE_PROVIDER_SITE_OTHER)

## 2024-02-25 ENCOUNTER — Ambulatory Visit: Admitting: Podiatry

## 2024-02-25 DIAGNOSIS — M7662 Achilles tendinitis, left leg: Secondary | ICD-10-CM

## 2024-02-25 DIAGNOSIS — M722 Plantar fascial fibromatosis: Secondary | ICD-10-CM | POA: Diagnosis not present

## 2024-02-25 MED ORDER — DICLOFENAC SODIUM 75 MG PO TBEC: 75.0000 mg | DELAYED_RELEASE_TABLET | Freq: Two times a day (BID) | ORAL | 2 refills | Status: DC

## 2024-02-25 MED ORDER — TRIAMCINOLONE ACETONIDE 10 MG/ML IJ SUSP
10.0000 mg | Freq: Once | INTRAMUSCULAR | Status: AC
  Administered 2024-02-25: 10 mg via INTRA_ARTICULAR

## 2024-02-26 NOTE — Progress Notes (Signed)
 Subjective:   Patient ID: [name] Avers, female   DOB: 43 y.o.   MRN: [id number]   HPI Patient presents with significant pain in the heel both plantar left and posterior left and states that she simply cannot put her heel on the ground due to the intensity of discomfort.  States its gotten worse recently neuro   ROS      Objective:  Physical Exam  Vascular status intact with patient found to have exquisite discomfort plantar aspect left heel and significant discomfort in the Achilles insertion left with fluid buildup and difficulty bearing weight on the foot due to the intensity of discomfort     Assessment:  Acute fasciitis left and Achilles tendinitis most likely compensatory left     Plan:  H&P reviewed and discussed these conditions and due to the intensity of discomfort I did do steroid injection 3 mg Kenalog  5 mg Liken into the plantar heel and then applied air fracture walker to lightly immobilize the foot and take all pressure off.  Patient to be seen back in approximately 4 weeks and placed on oral anti-inflammatory  X-rays indicate both plantar and posterior spur formation no indication stress fracture or arthritis

## 2024-03-06 ENCOUNTER — Telehealth: Payer: Self-pay | Admitting: Physician Assistant

## 2024-03-06 NOTE — Telephone Encounter (Signed)
-----   Message from Nurse Duwaine B sent at 02/29/2024  1:28 PM EDT ----- Regarding: Pre IUD medications [facility]! This pt is seeing for IUD removal/insertion on 8/14. She said that she could barely tolerate the pain last time. I did inform her that we have stuff such as the hurricane spray for before it is done to help with pain. She wanted me to ask is there anything she can be prescribed to take the night before or day of to help with making it a little less painful for her? Thanks!

## 2024-03-10 ENCOUNTER — Encounter: Payer: Self-pay | Admitting: Nurse Practitioner

## 2024-03-10 ENCOUNTER — Ambulatory Visit (INDEPENDENT_AMBULATORY_CARE_PROVIDER_SITE_OTHER): Admitting: Nurse Practitioner

## 2024-03-10 VITALS — BP 130/80 | HR 113 | Temp 98.8°F | Ht 65.0 in | Wt 299.0 lb

## 2024-03-10 DIAGNOSIS — E1169 Type 2 diabetes mellitus with other specified complication: Secondary | ICD-10-CM

## 2024-03-10 DIAGNOSIS — E782 Mixed hyperlipidemia: Secondary | ICD-10-CM

## 2024-03-10 DIAGNOSIS — Z794 Long term (current) use of insulin: Secondary | ICD-10-CM

## 2024-03-10 DIAGNOSIS — N764 Abscess of vulva: Secondary | ICD-10-CM

## 2024-03-10 DIAGNOSIS — Z2821 Immunization not carried out because of patient refusal: Secondary | ICD-10-CM

## 2024-03-10 DIAGNOSIS — E669 Obesity, unspecified: Secondary | ICD-10-CM

## 2024-03-10 DIAGNOSIS — I10 Essential (primary) hypertension: Secondary | ICD-10-CM

## 2024-03-10 DIAGNOSIS — E119 Type 2 diabetes mellitus without complications: Secondary | ICD-10-CM

## 2024-03-10 NOTE — Assessment & Plan Note (Signed)
 Continue atorvastatin 20 mg

## 2024-03-10 NOTE — Progress Notes (Signed)
 LILLETTE Kristeen JINNY Gladis, CMA,acting as a Neurosurgeon for [name], FNP.,have documented all relevant documentation on the behalf of [name], FNP,as directed by  [name], FNP while in the presence of [name], FNP.  Subjective:  Patient ID: [name] , female    DOB: 1981/06/16 , 43 y.o.   MRN: [id number]  Chief Complaint  Patient presents with   Hypertension    Patient presents today for a bp and dm follow up, Patient reports compliance with medication. Patient denies any chest pain, SOB, or headaches. Patient reports she has a boil on the left lip of her vagina she would like looked at today.      HPI  Patient presents for BP and weight check.    She is experiencing nausea, has not ate since yesterday evening.  States that she is not eating how she supposed to be eating, twice a day spaced out meals.  She is eating fruits, peanut and jelly in mornings with soda.  In the evenings she is having leftovers usually rice and chicken, beans.  She is snacking on chips and soda but has been attempting to snack less.  Trying to reduce soda for juice.  She drinks 64 oz of water  a day with her stanley cup.    She has not been checking her blood sugars recently, she is out of strips.  Before the last 3 months when she was able to take it she was getting normal readings.  Experiences hypoglycemic episodes during the day time in which she his symptomatic.   Endorses wound/boil to labia, she first noticed this 4 days ago, and states that it has increased in sizes as well as pain.     Hypertension This is a chronic problem. The current episode started more than 1 year ago. The problem is resistant. Associated symptoms include blurred vision and headaches. Pertinent negatives include no chest pain or palpitations. There are no associated agents to hypertension. Risk factors for coronary artery disease include diabetes mellitus, dyslipidemia, family history, obesity, sedentary lifestyle and  stress. Past treatments include diuretics and calcium  channel blockers. Compliance problems include diet, exercise and medication cost.   Diabetes She presents for her follow-up diabetic visit. She has type 2 diabetes mellitus. Her disease course has been fluctuating. Hypoglycemia symptoms include dizziness, headaches and tremors. Associated symptoms include blurred vision, fatigue, polydipsia and polyuria. Pertinent negatives for diabetes include no chest pain and no polyphagia. Symptoms are worsening. Diabetic complications include heart disease. Risk factors for coronary artery disease include sedentary lifestyle, obesity, stress, family history and diabetes mellitus. Current diabetic treatment includes oral agent (dual therapy). She is following a generally unhealthy and diabetic diet. She has not had a previous visit with a dietitian. She rarely participates in exercise.     Past Medical History:  Diagnosis Date   Abnormal Pap smear    f/u wnl   Asthma    DM2 (diabetes mellitus, type 2) (HCC)    DM2 (diabetes mellitus, type 2) (HCC) 09/13/2012   AIC 9.7 09-24-12 Referred to Dr. Jarold after consult with Dr. Darcel Appt. Scheduled  C. Armer, CNM, FNP    Fibroid    Gestational diabetes    Hypertension    Morbid obesity (HCC)    Sleep apnea    not on cpap, diagnosed when pregnant   Urinary tract infection      Family History  Problem Relation Age of Onset   Allergies Mother    Asthma Mother  Hypertension Mother    Diabetes Mother    Diabetes Father    Diabetes Maternal Grandmother    Heart disease Maternal Grandmother    Liver cancer Other    Liver cancer Other    Bladder Cancer Maternal Uncle    Anesthesia problems Neg Hx    Hypotension Neg Hx    Malignant hyperthermia Neg Hx    Pseudochol deficiency Neg Hx    Hearing loss Neg Hx      Current Outpatient Medications:    albuterol  (VENTOLIN  HFA) 108 (90 Base) MCG/ACT inhaler, Inhale 1-2 puffs into the lungs every 6  (six) hours as needed for wheezing or shortness of breath., Disp: 18 g, Rfl: 0   atorvastatin  (LIPITOR) 20 MG tablet, Take 1 tablet (20 mg total) by mouth daily., Disp: 90 tablet, Rfl: 1   cetirizine  (ZYRTEC  ALLERGY) 10 MG tablet, Take 1 tablet (10 mg total) by mouth daily. (Patient taking differently: Take 10 mg by mouth daily as needed for allergies.), Disp: 90 tablet, Rfl: 2   fluticasone  (FLONASE ) 50 MCG/ACT nasal spray, Place 1-2 sprays into both nostrils daily., Disp: 16 g, Rfl: 0   glucose blood (ACCU-CHEK GUIDE) test strip, 1 each by Other route daily in the afternoon. Use as instructed, Disp: 100 each, Rfl: 3   Insulin  Glargine (BASAGLAR  KWIKPEN) 100 UNIT/ML, Inject 40 Units into the skin daily., Disp: 45 mL, Rfl: 3   Insulin  Pen Needle 31G X 8 MM MISC, 1 Device by Does not apply route daily., Disp: 100 each, Rfl: 2   Lancets (ACCU-CHEK MULTICLIX) lancets, USE TO CHECK BLOOD SUGARS UP TO 4 TIMES DAILY  DX CODE:E11.9, Disp: 100 each, Rfl: 12   levonorgestrel  (MIRENA ) 20 MCG/24HR IUD, 1 each by Intrauterine route once., Disp: , Rfl:    metFORMIN  (GLUCOPHAGE -XR) 750 MG 24 hr tablet, Take 2 tablets (1,500 mg total) by mouth daily with breakfast., Disp: 180 tablet, Rfl: 2   sertraline  (ZOLOFT ) 50 MG tablet, Take 1 1/2 tablet by mouth daily, Disp: 145 tablet, Rfl: 1   triamcinolone  (KENALOG ) 0.025 % ointment, Apply 1 application topically 2 (two) times daily. (Patient taking differently: Apply 1 application  topically daily as needed (rash).), Disp: 30 g, Rfl: 0   Accu-Chek Softclix Lancets lancets, Use 3 (three) times daily. Use as directed to check blood sugar. May dispense any manufacturer covered by patient's insurance and fits patient's device., Disp: 100 each, Rfl: 0   Blood Glucose Monitoring Suppl (BLOOD GLUCOSE MONITOR SYSTEM) w/Device KIT, Use 3 (three) times daily as directed, Disp: 1 kit, Rfl: 0   ferrous sulfate  325 (65 FE) MG tablet, Take 1 tablet (325 mg total) by mouth 2 (two) times  daily with a meal., Disp: 60 tablet, Rfl: 2   Glucose Blood (BLOOD GLUCOSE TEST STRIPS) STRP, Use 3 (three) times daily as directed to check blood sugar., Disp: 100 strip, Rfl: 0   insulin  aspart (NOVOLOG  FLEXPEN) 100 UNIT/ML FlexPen, Inject 4 Units into the skin 3 (three) times daily with meals. If eating and Blood Glucose (BG) 80 or higher inject 0 units for meal coverage and add correction dose per scale. If not eating, correction dose only. BG <150= 0 unit; BG 150-200= 1 unit; BG 201-250= 2 unit; BG 251-300= 3 unit; BG 301-350= 4 unit; BG 351-400= 5 unit; BG >400= 6 unit and Call Primary Care., Disp: 9 mL, Rfl: 11   insulin  aspart (NOVOLOG ) 100 UNIT/ML injection, Inject 4 Units into the skin 3 (three) times daily with  meals. If eating and Blood Glucose (BG) 80 or higher inject 0 units for meal coverage and add correction dose per scale. If not eating, correction dose only. BG <150= 0 unit; BG 150-200= 1 unit; BG 201-250= 2 unit; BG 251-300= 3 unit; BG 301-350= 4 unit; BG 351-400= 5 unit; BG >400= 6 unit and Call Primary Care., Disp: 10 mL, Rfl: 0   Insulin  Pen Needle 32G X 4 MM MISC, Use as directed 3 (three) times daily., Disp: 100 each, Rfl: 0   Lancet Device MISC, Use 3 (three) times daily. May dispense any manufacturer covered by patient's insurance., Disp: 1 each, Rfl: 0   methocarbamol  (ROBAXIN ) 500 MG tablet, Take 1 tablet (500 mg total) by mouth every 8 (eight) hours as needed for muscle spasms., Disp: 30 tablet, Rfl: 0   omeprazole (PRILOSEC) 20 MG capsule, Take 20 mg by mouth daily., Disp: , Rfl:    ondansetron  (ZOFRAN ) 8 MG tablet, Take 1 tablet (8 mg total) by mouth every 8 (eight) hours as needed for nausea or vomiting., Disp: 60 tablet, Rfl: 0   oxyCODONE -acetaminophen  (PERCOCET) 7.5-325 MG tablet, Take 2 tablets by mouth every 4 (four) hours as needed for moderate pain (pain score 4-6)., Disp: 30 tablet, Rfl: 0   Allergies  Allergen Reactions   Fish Allergy Anaphylaxis   Other  Anaphylaxis and Other (See Comments)    Pt has fish allergy Pt has allergy to butter cookies   Penicillins Rash and Other (See Comments)    Told MD that PCN reaction is rash and that she has taken amoxicillin  before. Tolerated Cefepime  X1 during 8/25 admission.    Bactrim  [Sulfamethoxazole -Trimethoprim ] Diarrhea and Rash    Abdominal cramps     Review of Systems  Constitutional:  Positive for fatigue. Negative for fever.  Eyes:  Positive for blurred vision and visual disturbance (with low blood sugars).  Cardiovascular:  Negative for chest pain, palpitations and leg swelling.  Gastrointestinal:  Positive for nausea. Negative for abdominal pain, constipation, diarrhea and vomiting.  Endocrine: Positive for polydipsia and polyuria. Negative for polyphagia.  Musculoskeletal:  Positive for arthralgias (left foot pain, in boot plantar faciitis) and gait problem (unsteady, in boot).  Neurological:  Positive for dizziness, tremors and headaches.     Today's Vitals   03/10/24 1206  BP: 130/80  Pulse: (!) 113  Temp: 98.8 F (37.1 C)  TempSrc: Oral  Weight: 299 lb (135.6 kg)  Height: 5' 5 (1.651 m)  PainSc: 7   PainLoc: Leg   Body mass index is 49.76 kg/m.  Wt Readings from Last 3 Encounters:  03/25/24 (!) 309 lb 3.2 oz (140.3 kg)  03/23/24 (!) 314 lb 9.6 oz (142.7 kg)  03/10/24 299 lb (135.6 kg)      Objective:  Physical Exam Vitals and nursing note reviewed. Exam conducted with a chaperone present.  Constitutional:      Appearance: Normal appearance. She is obese.  HENT:     Head: Normocephalic and atraumatic.  Neck:     Vascular: No carotid bruit.  Cardiovascular:     Rate and Rhythm: Normal rate and regular rhythm.     Pulses: Normal pulses.     Heart sounds: Normal heart sounds. No murmur heard. Pulmonary:     Effort: Pulmonary effort is normal. No respiratory distress.     Breath sounds: Normal breath sounds. No wheezing.  Genitourinary:    General: Normal vulva.      Labia:  Right: No tenderness.        Left: Tenderness present. No rash.       Comments: Raised firm area to left labia, tenderness Musculoskeletal:     Right lower leg: No edema.     Left lower leg: No edema.  Skin:    General: Skin is warm and dry.     Capillary Refill: Capillary refill takes less than 2 seconds.  Neurological:     General: No focal deficit present.     Mental Status: She is alert and oriented to person, place, and time. Mental status is at baseline.  Psychiatric:        Mood and Affect: Mood normal.        Behavior: Behavior normal.        Thought Content: Thought content normal.        Judgment: Judgment normal.            03/25/2024    2:33 PM 03/10/2024   12:16 PM 11/02/2023    9:55 AM 05/04/2023   11:03 AM 01/21/2023    2:37 PM  Depression screen PHQ 2/9  Decreased Interest 3 2 3 3 3   Down, Depressed, Hopeless 3 1 2  0 3  PHQ - 2 Score 6 3 5 3 6   Altered sleeping  1 0 1 3  Tired, decreased energy  2 2 3 3   Change in appetite  1 2 3 3   Feeling bad or failure about yourself   1 1 0 0  Trouble concentrating  1 0 1 3  Moving slowly or fidgety/restless  0 0 0 3  Suicidal thoughts  0 0 0 0  PHQ-9 Score  9 10 11 21   Difficult doing work/chores  Not difficult at all Not difficult at all Somewhat difficult Very difficult    Assessment And Plan:  Mixed hyperlipidemia Assessment & Plan: Continue atorvastatin  20 mg.    Orders: -     Lipid panel -     BMP8+eGFR  Essential hypertension Assessment & Plan: Controlled, continue losartan /hydrochlorothiazide .    Orders: -     BMP8+eGFR  COVID-19 vaccination declined  Boil, labium Assessment & Plan: Large boil to left labia, keflex  500 mg ordered, patient has history of taking this without complications.  Diflucan  ordered for yeast infection risk, patient given instructions on finishing antibiotics, signs and symptoms or reaction, and follow up precautions.  Patient has gyn visit scheduled for  next week.     Type 2 diabetes mellitus without complication, with long-term current use of insulin  (HCC) Assessment & Plan: Hgb A1C recheck, last level 9.2.  Seeing Dr Gwenette with endocrinology, patient advised to make follow up appointment for possible medication adjustment for hypoglycemic events and diabetes education/counseling.    Orders: -     Hemoglobin A1c -     BMP8+eGFR    Return for keep same next.  Patient was given opportunity to ask questions. Patient verbalized understanding of the plan and was able to repeat key elements of the plan. All questions were answered to their satisfaction.    LILLETTE [name], FNP, have reviewed all documentation for this visit. The documentation on 03/10/24 for the exam, diagnosis, procedures, and orders are all accurate and complete.   IF YOU HAVE BEEN REFERRED TO A SPECIALIST, IT MAY TAKE 1-2 WEEKS TO SCHEDULE/PROCESS THE REFERRAL. IF YOU HAVE NOT HEARD FROM US /SPECIALIST IN TWO WEEKS, PLEASE GIVE US  A CALL AT [phone].

## 2024-03-10 NOTE — Assessment & Plan Note (Signed)
 Large boil to left labia, keflex  500 mg ordered, patient has history of taking this without complications.  Diflucan  ordered for yeast infection risk, patient given instructions on finishing antibiotics, signs and symptoms or reaction, and follow up precautions.  Patient has gyn visit scheduled for next week.

## 2024-03-10 NOTE — Assessment & Plan Note (Signed)
 Hgb A1C recheck, last level 9.2.  Seeing Dr Gwenette with endocrinology, patient advised to make follow up appointment for possible medication adjustment for hypoglycemic events and diabetes education/counseling.

## 2024-03-10 NOTE — Assessment & Plan Note (Signed)
 Controlled, continue losartan /hydrochlorothiazide .

## 2024-03-11 LAB — BMP8+EGFR
BUN/Creatinine Ratio: 29 — ABNORMAL HIGH (ref 9–23)
BUN: 33 mg/dL — ABNORMAL HIGH (ref 6–24)
CO2: 19 mmol/L — ABNORMAL LOW (ref 20–29)
Calcium: 11.2 mg/dL — ABNORMAL HIGH (ref 8.7–10.2)
Chloride: 96 mmol/L (ref 96–106)
Creatinine, Ser: 1.14 mg/dL — ABNORMAL HIGH (ref 0.57–1.00)
Glucose: 108 mg/dL — ABNORMAL HIGH (ref 70–99)
Potassium: 5.3 mmol/L — ABNORMAL HIGH (ref 3.5–5.2)
Sodium: 136 mmol/L (ref 134–144)
eGFR: 62 mL/min/1.73 (ref >59)

## 2024-03-11 LAB — LIPID PANEL
Chol/HDL Ratio: 3.9 ratio (ref 0.0–4.4)
Cholesterol, Total: 161 mg/dL (ref 100–199)
HDL: 41 mg/dL (ref >39)
LDL Chol Calc (NIH): 101 mg/dL — ABNORMAL HIGH (ref 0–99)
Triglycerides: 106 mg/dL (ref 0–149)
VLDL Cholesterol Cal: 19 mg/dL (ref 5–40)

## 2024-03-11 LAB — HEMOGLOBIN A1C
Est. average glucose Bld gHb Est-mCnc: 206 mg/dL
Hgb A1c MFr Bld: 8.8 % — ABNORMAL HIGH (ref 4.8–5.6)

## 2024-03-11 MED ORDER — FLUCONAZOLE 100 MG PO TABS: ORAL_TABLET | ORAL | 0 refills | Status: DC

## 2024-03-11 MED ORDER — CEPHALEXIN 500 MG PO CAPS: 500.0000 mg | ORAL_CAPSULE | Freq: Four times a day (QID) | ORAL | 0 refills | Status: DC

## 2024-03-13 ENCOUNTER — Inpatient Hospital Stay (HOSPITAL_BASED_OUTPATIENT_CLINIC_OR_DEPARTMENT_OTHER)
Admission: AD | Admit: 2024-03-13 | Discharge: 2024-03-23 | DRG: 853 | Disposition: A | Discharge status: Home Health | Attending: Internal Medicine | Admitting: Internal Medicine

## 2024-03-13 ENCOUNTER — Emergency Department (HOSPITAL_BASED_OUTPATIENT_CLINIC_OR_DEPARTMENT_OTHER)

## 2024-03-13 ENCOUNTER — Inpatient Hospital Stay (HOSPITAL_COMMUNITY): Admitting: Anesthesiology

## 2024-03-13 ENCOUNTER — Encounter (HOSPITAL_BASED_OUTPATIENT_CLINIC_OR_DEPARTMENT_OTHER): Payer: Self-pay | Admitting: Emergency Medicine

## 2024-03-13 ENCOUNTER — Other Ambulatory Visit: Payer: Self-pay

## 2024-03-13 ENCOUNTER — Encounter (HOSPITAL_COMMUNITY)
Admission: AD | Disposition: A | Discharge status: Home Health | Payer: Self-pay | Source: Home / Self Care | Attending: Family Medicine

## 2024-03-13 DIAGNOSIS — M726 Necrotizing fasciitis: Secondary | ICD-10-CM | POA: Diagnosis present

## 2024-03-13 DIAGNOSIS — Z1639 Resistance to other specified antimicrobial drug: Secondary | ICD-10-CM | POA: Diagnosis not present

## 2024-03-13 DIAGNOSIS — E871 Hypo-osmolality and hyponatremia: Secondary | ICD-10-CM | POA: Diagnosis present

## 2024-03-13 DIAGNOSIS — K7469 Other cirrhosis of liver: Secondary | ICD-10-CM | POA: Diagnosis not present

## 2024-03-13 DIAGNOSIS — F32A Depression, unspecified: Secondary | ICD-10-CM | POA: Diagnosis not present

## 2024-03-13 DIAGNOSIS — E875 Hyperkalemia: Secondary | ICD-10-CM | POA: Diagnosis present

## 2024-03-13 DIAGNOSIS — Z880 Allergy status to penicillin: Secondary | ICD-10-CM

## 2024-03-13 DIAGNOSIS — N7682 Fournier disease of vagina and vulva: Secondary | ICD-10-CM | POA: Diagnosis present

## 2024-03-13 DIAGNOSIS — E1129 Type 2 diabetes mellitus with other diabetic kidney complication: Secondary | ICD-10-CM

## 2024-03-13 DIAGNOSIS — D6489 Other specified anemias: Secondary | ICD-10-CM | POA: Diagnosis present

## 2024-03-13 DIAGNOSIS — K7689 Other specified diseases of liver: Secondary | ICD-10-CM | POA: Diagnosis not present

## 2024-03-13 DIAGNOSIS — Z1152 Encounter for screening for COVID-19: Secondary | ICD-10-CM | POA: Diagnosis not present

## 2024-03-13 DIAGNOSIS — F418 Other specified anxiety disorders: Secondary | ICD-10-CM | POA: Diagnosis present

## 2024-03-13 DIAGNOSIS — Z975 Presence of (intrauterine) contraceptive device: Secondary | ICD-10-CM

## 2024-03-13 DIAGNOSIS — Z7984 Long term (current) use of oral hypoglycemic drugs: Secondary | ICD-10-CM

## 2024-03-13 DIAGNOSIS — Z6841 Body Mass Index (BMI) 40.0 and over, adult: Secondary | ICD-10-CM | POA: Diagnosis not present

## 2024-03-13 DIAGNOSIS — R161 Splenomegaly, not elsewhere classified: Secondary | ICD-10-CM | POA: Diagnosis not present

## 2024-03-13 DIAGNOSIS — J439 Emphysema, unspecified: Secondary | ICD-10-CM | POA: Diagnosis present

## 2024-03-13 DIAGNOSIS — E8809 Other disorders of plasma-protein metabolism, not elsewhere classified: Secondary | ICD-10-CM | POA: Diagnosis not present

## 2024-03-13 DIAGNOSIS — E872 Acidosis, unspecified: Secondary | ICD-10-CM | POA: Diagnosis present

## 2024-03-13 DIAGNOSIS — L0231 Cutaneous abscess of buttock: Secondary | ICD-10-CM | POA: Diagnosis not present

## 2024-03-13 DIAGNOSIS — N179 Acute kidney failure, unspecified: Secondary | ICD-10-CM | POA: Diagnosis present

## 2024-03-13 DIAGNOSIS — N762 Acute vulvitis: Secondary | ICD-10-CM | POA: Diagnosis present

## 2024-03-13 DIAGNOSIS — D259 Leiomyoma of uterus, unspecified: Secondary | ICD-10-CM | POA: Diagnosis not present

## 2024-03-13 DIAGNOSIS — Z7985 Long-term (current) use of injectable non-insulin antidiabetic drugs: Secondary | ICD-10-CM

## 2024-03-13 DIAGNOSIS — I10 Essential (primary) hypertension: Secondary | ICD-10-CM | POA: Diagnosis present

## 2024-03-13 DIAGNOSIS — B955 Unspecified streptococcus as the cause of diseases classified elsewhere: Secondary | ICD-10-CM | POA: Diagnosis present

## 2024-03-13 DIAGNOSIS — E119 Type 2 diabetes mellitus without complications: Secondary | ICD-10-CM | POA: Diagnosis not present

## 2024-03-13 DIAGNOSIS — E662 Morbid (severe) obesity with alveolar hypoventilation: Secondary | ICD-10-CM | POA: Diagnosis not present

## 2024-03-13 DIAGNOSIS — Z91013 Allergy to seafood: Secondary | ICD-10-CM

## 2024-03-13 DIAGNOSIS — E1165 Type 2 diabetes mellitus with hyperglycemia: Secondary | ICD-10-CM | POA: Diagnosis present

## 2024-03-13 DIAGNOSIS — R739 Hyperglycemia, unspecified: Secondary | ICD-10-CM | POA: Diagnosis present

## 2024-03-13 DIAGNOSIS — R5381 Other malaise: Secondary | ICD-10-CM | POA: Diagnosis present

## 2024-03-13 DIAGNOSIS — L02215 Cutaneous abscess of perineum: Secondary | ICD-10-CM | POA: Diagnosis not present

## 2024-03-13 DIAGNOSIS — Z8679 Personal history of other diseases of the circulatory system: Secondary | ICD-10-CM | POA: Diagnosis not present

## 2024-03-13 DIAGNOSIS — N764 Abscess of vulva: Secondary | ICD-10-CM | POA: Diagnosis not present

## 2024-03-13 DIAGNOSIS — Z87891 Personal history of nicotine dependence: Secondary | ICD-10-CM

## 2024-03-13 DIAGNOSIS — Z794 Long term (current) use of insulin: Secondary | ICD-10-CM

## 2024-03-13 DIAGNOSIS — J4520 Mild intermittent asthma, uncomplicated: Secondary | ICD-10-CM | POA: Diagnosis present

## 2024-03-13 DIAGNOSIS — Z833 Family history of diabetes mellitus: Secondary | ICD-10-CM

## 2024-03-13 DIAGNOSIS — R590 Localized enlarged lymph nodes: Secondary | ICD-10-CM | POA: Diagnosis not present

## 2024-03-13 DIAGNOSIS — R160 Hepatomegaly, not elsewhere classified: Secondary | ICD-10-CM | POA: Diagnosis not present

## 2024-03-13 DIAGNOSIS — A419 Sepsis, unspecified organism: Secondary | ICD-10-CM | POA: Diagnosis not present

## 2024-03-13 DIAGNOSIS — F419 Anxiety disorder, unspecified: Secondary | ICD-10-CM | POA: Diagnosis present

## 2024-03-13 DIAGNOSIS — R531 Weakness: Secondary | ICD-10-CM | POA: Diagnosis not present

## 2024-03-13 DIAGNOSIS — R571 Hypovolemic shock: Secondary | ICD-10-CM | POA: Diagnosis present

## 2024-03-13 DIAGNOSIS — Z8632 Personal history of gestational diabetes: Secondary | ICD-10-CM

## 2024-03-13 DIAGNOSIS — D500 Iron deficiency anemia secondary to blood loss (chronic): Secondary | ICD-10-CM | POA: Diagnosis not present

## 2024-03-13 DIAGNOSIS — N200 Calculus of kidney: Secondary | ICD-10-CM | POA: Diagnosis not present

## 2024-03-13 DIAGNOSIS — Z79899 Other long term (current) drug therapy: Secondary | ICD-10-CM

## 2024-03-13 DIAGNOSIS — R6520 Severe sepsis without septic shock: Secondary | ICD-10-CM | POA: Diagnosis not present

## 2024-03-13 DIAGNOSIS — Z8249 Family history of ischemic heart disease and other diseases of the circulatory system: Secondary | ICD-10-CM

## 2024-03-13 DIAGNOSIS — R42 Dizziness and giddiness: Secondary | ICD-10-CM

## 2024-03-13 DIAGNOSIS — K219 Gastro-esophageal reflux disease without esophagitis: Secondary | ICD-10-CM | POA: Diagnosis present

## 2024-03-13 DIAGNOSIS — D75839 Thrombocytosis, unspecified: Secondary | ICD-10-CM | POA: Diagnosis present

## 2024-03-13 DIAGNOSIS — Z881 Allergy status to other antibiotic agents status: Secondary | ICD-10-CM

## 2024-03-13 DIAGNOSIS — B954 Other streptococcus as the cause of diseases classified elsewhere: Secondary | ICD-10-CM | POA: Diagnosis present

## 2024-03-13 DIAGNOSIS — M799 Soft tissue disorder, unspecified: Secondary | ICD-10-CM | POA: Diagnosis not present

## 2024-03-13 DIAGNOSIS — R809 Proteinuria, unspecified: Secondary | ICD-10-CM | POA: Diagnosis not present

## 2024-03-13 DIAGNOSIS — R600 Localized edema: Secondary | ICD-10-CM | POA: Diagnosis not present

## 2024-03-13 DIAGNOSIS — C7800 Secondary malignant neoplasm of unspecified lung: Secondary | ICD-10-CM | POA: Diagnosis not present

## 2024-03-13 DIAGNOSIS — Z743 Need for continuous supervision: Secondary | ICD-10-CM | POA: Diagnosis not present

## 2024-03-13 DIAGNOSIS — R519 Headache, unspecified: Secondary | ICD-10-CM | POA: Diagnosis not present

## 2024-03-13 DIAGNOSIS — R051 Acute cough: Principal | ICD-10-CM

## 2024-03-13 DIAGNOSIS — Z825 Family history of asthma and other chronic lower respiratory diseases: Secondary | ICD-10-CM

## 2024-03-13 DIAGNOSIS — R162 Hepatomegaly with splenomegaly, not elsewhere classified: Secondary | ICD-10-CM | POA: Diagnosis present

## 2024-03-13 HISTORY — PX: RECTAL EXAM UNDER ANESTHESIA: SHX6399

## 2024-03-13 HISTORY — PX: INCISION AND DRAINAGE OF WOUND: SHX1803

## 2024-03-13 LAB — CBC WITH DIFFERENTIAL/PLATELET
Abs Immature Granulocytes: 0.65 K/uL — ABNORMAL HIGH (ref 0.00–0.07)
Basophils Absolute: 0.1 K/uL (ref 0.0–0.1)
Basophils Absolute: 0 K/uL (ref 0.0–0.1)
Basophils Relative: 0 %
Basophils Relative: 0 %
Eosinophils Absolute: 0.2 K/uL (ref 0.0–0.5)
Eosinophils Absolute: 0.5 K/uL (ref 0.0–0.5)
Eosinophils Relative: 1 %
Eosinophils Relative: 2 %
HCT: 36.7 % (ref 36.0–46.0)
HCT: 34 % — ABNORMAL LOW (ref 36.0–46.0)
Hemoglobin: 11.7 g/dL — ABNORMAL LOW (ref 12.0–15.0)
Hemoglobin: 10.8 g/dL — ABNORMAL LOW (ref 12.0–15.0)
Immature Granulocytes: 3 %
Lymphocytes Relative: 3 %
Lymphocytes Relative: 1 %
Lymphs Abs: 0.7 K/uL (ref 0.7–4.0)
Lymphs Abs: 0.3 K/uL — ABNORMAL LOW (ref 0.7–4.0)
MCH: 26.2 pg (ref 26.0–34.0)
MCH: 26.1 pg (ref 26.0–34.0)
MCHC: 31.9 g/dL (ref 30.0–36.0)
MCHC: 31.8 g/dL (ref 30.0–36.0)
MCV: 82.3 fL (ref 80.0–100.0)
MCV: 82.1 fL (ref 80.0–100.0)
Monocytes Absolute: 2.3 K/uL — ABNORMAL HIGH (ref 0.1–1.0)
Monocytes Absolute: 2.3 K/uL — ABNORMAL HIGH (ref 0.1–1.0)
Monocytes Relative: 11 %
Monocytes Relative: 9 %
Neutro Abs: 17.7 K/uL — ABNORMAL HIGH (ref 1.7–7.7)
Neutro Abs: 22.1 K/uL — ABNORMAL HIGH (ref 1.7–7.7)
Neutrophils Relative %: 82 %
Neutrophils Relative %: 88 %
Platelets: 496 K/uL — ABNORMAL HIGH (ref 150–400)
Platelets: 394 K/uL (ref 150–400)
RBC: 4.46 MIL/uL (ref 3.87–5.11)
RBC: 4.14 MIL/uL (ref 3.87–5.11)
RDW: 17 % — ABNORMAL HIGH (ref 11.5–15.5)
RDW: 16.7 % — ABNORMAL HIGH (ref 11.5–15.5)
Smear Review: NORMAL
WBC: 21.6 K/uL — ABNORMAL HIGH (ref 4.0–10.5)
WBC: 25.1 K/uL — ABNORMAL HIGH (ref 4.0–10.5)
nRBC: 0 % (ref 0.0–0.2)
nRBC: 0 % (ref 0.0–0.2)

## 2024-03-13 LAB — PROTIME-INR
INR: 1.3 — ABNORMAL HIGH (ref 0.8–1.2)
Prothrombin Time: 16.4 s — ABNORMAL HIGH (ref 11.4–15.2)

## 2024-03-13 LAB — BASIC METABOLIC PANEL WITH GFR
Anion gap: 17 — ABNORMAL HIGH (ref 5–15)
BUN: 29 mg/dL — ABNORMAL HIGH (ref 6–20)
CO2: 21 mmol/L — ABNORMAL LOW (ref 22–32)
Calcium: 12.1 mg/dL — ABNORMAL HIGH (ref 8.9–10.3)
Chloride: 94 mmol/L — ABNORMAL LOW (ref 98–111)
Creatinine, Ser: 1.01 mg/dL — ABNORMAL HIGH (ref 0.44–1.00)
GFR, Estimated: >60 mL/min (ref >60)
Glucose, Bld: 348 mg/dL — ABNORMAL HIGH (ref 70–99)
Potassium: 3.9 mmol/L (ref 3.5–5.1)
Sodium: 132 mmol/L — ABNORMAL LOW (ref 135–145)

## 2024-03-13 LAB — TYPE AND SCREEN
ABO/RH(D): A POS
Antibody Screen: NEGATIVE

## 2024-03-13 LAB — COMPREHENSIVE METABOLIC PANEL WITH GFR
ALT: 23 U/L (ref 0–44)
AST: 23 U/L (ref 15–41)
Albumin: 1.9 g/dL — ABNORMAL LOW (ref 3.5–5.0)
Alkaline Phosphatase: 247 U/L — ABNORMAL HIGH (ref 38–126)
Anion gap: 14 (ref 5–15)
BUN: 22 mg/dL — ABNORMAL HIGH (ref 6–20)
CO2: 21 mmol/L — ABNORMAL LOW (ref 22–32)
Calcium: 11 mg/dL — ABNORMAL HIGH (ref 8.9–10.3)
Chloride: 96 mmol/L — ABNORMAL LOW (ref 98–111)
Creatinine, Ser: 0.86 mg/dL (ref 0.44–1.00)
GFR, Estimated: >60 mL/min (ref >60)
Glucose, Bld: 179 mg/dL — ABNORMAL HIGH (ref 70–99)
Potassium: 3.8 mmol/L (ref 3.5–5.1)
Sodium: 131 mmol/L — ABNORMAL LOW (ref 135–145)
Total Bilirubin: 1.2 mg/dL (ref 0.0–1.2)
Total Protein: 6.7 g/dL (ref 6.5–8.1)

## 2024-03-13 LAB — HCG, QUANTITATIVE, PREGNANCY: hCG, Beta Chain, Quant, S: <1 m[IU]/mL (ref <5)

## 2024-03-13 LAB — LACTIC ACID, PLASMA
Lactic Acid, Venous: 5.2 mmol/L (ref 0.5–1.9)
Lactic Acid, Venous: 4.3 mmol/L (ref 0.5–1.9)
Lactic Acid, Venous: 4.1 mmol/L (ref 0.5–1.9)

## 2024-03-13 LAB — APTT: aPTT: 36 s (ref 24–36)

## 2024-03-13 LAB — GLUCOSE, CAPILLARY: Glucose-Capillary: 156 mg/dL — ABNORMAL HIGH (ref 70–99)

## 2024-03-13 LAB — FIBRINOGEN: Fibrinogen: >800 mg/dL — ABNORMAL HIGH (ref 210–475)

## 2024-03-13 SURGERY — EXAM UNDER ANESTHESIA, RECTUM
Anesthesia: General

## 2024-03-13 MED ORDER — FENTANYL CITRATE (PF) 250 MCG/5ML IJ SOLN
INTRAMUSCULAR | Status: DC | PRN
  Administered 2024-03-13: 50 ug via INTRAVENOUS

## 2024-03-13 MED ORDER — SODIUM CHLORIDE 0.9 % IV SOLN: INTRAVENOUS | Status: DC | PRN

## 2024-03-13 MED ORDER — SUCCINYLCHOLINE CHLORIDE 200 MG/10ML IV SOSY
PREFILLED_SYRINGE | INTRAVENOUS | Status: AC
  Filled 2024-03-13: qty 10

## 2024-03-13 MED ORDER — ONDANSETRON HCL 4 MG/2ML IJ SOLN
INTRAMUSCULAR | Status: DC | PRN
  Administered 2024-03-13: 4 mg via INTRAVENOUS

## 2024-03-13 MED ORDER — LACTATED RINGERS IV BOLUS
1000.0000 mL | Freq: Once | INTRAVENOUS | Status: AC
  Administered 2024-03-13: 1000 mL via INTRAVENOUS

## 2024-03-13 MED ORDER — INSULIN ASPART 100 UNIT/ML IJ SOLN
0.0000 [IU] | INTRAMUSCULAR | Status: DC
  Administered 2024-03-14: 1 [IU] via SUBCUTANEOUS
  Administered 2024-03-14: 2 [IU] via SUBCUTANEOUS
  Administered 2024-03-14: 3 [IU] via SUBCUTANEOUS
  Administered 2024-03-14: 2 [IU] via SUBCUTANEOUS
  Administered 2024-03-14 (×3): 3 [IU] via SUBCUTANEOUS
  Administered 2024-03-14: 1 [IU] via SUBCUTANEOUS

## 2024-03-13 MED ORDER — PROPOFOL 10 MG/ML IV BOLUS
INTRAVENOUS | Status: AC
  Filled 2024-03-13: qty 20

## 2024-03-13 MED ORDER — SODIUM CHLORIDE 0.9 % IV SOLN
1.0000 g | Freq: Three times a day (TID) | INTRAVENOUS | Status: DC
  Administered 2024-03-14 – 2024-03-16 (×12): 1 g via INTRAVENOUS
  Filled 2024-03-13 (×8): qty 20

## 2024-03-13 MED ORDER — NALOXONE HCL 0.4 MG/ML IJ SOLN: 0.4000 mg | INTRAMUSCULAR | Status: DC | PRN

## 2024-03-13 MED ORDER — ONDANSETRON HCL 4 MG/2ML IJ SOLN
4.0000 mg | Freq: Once | INTRAMUSCULAR | Status: AC
  Administered 2024-03-13: 4 mg via INTRAVENOUS
  Filled 2024-03-13: qty 2

## 2024-03-13 MED ORDER — FENTANYL CITRATE (PF) 100 MCG/2ML IJ SOLN
INTRAMUSCULAR | Status: AC
Start: 2024-03-13 — End: 2024-03-13
  Filled 2024-03-13: qty 2

## 2024-03-13 MED ORDER — HYDROMORPHONE HCL 1 MG/ML IJ SOLN: 0.5000 mg | INTRAMUSCULAR | Status: DC | PRN

## 2024-03-13 MED ORDER — OXYCODONE HCL 5 MG PO TABS: 5.0000 mg | ORAL_TABLET | Freq: Once | ORAL | Status: DC | PRN

## 2024-03-13 MED ORDER — SODIUM CHLORIDE 0.9 % IV SOLN
2.0000 g | Freq: Once | INTRAVENOUS | Status: AC
  Administered 2024-03-13: 2 g via INTRAVENOUS
  Filled 2024-03-13: qty 12.5

## 2024-03-13 MED ORDER — SODIUM CHLORIDE 0.9 % IV SOLN: INTRAVENOUS | Status: AC | PRN

## 2024-03-13 MED ORDER — OXYCODONE HCL 5 MG/5ML PO SOLN: 5.0000 mg | Freq: Once | ORAL | Status: DC | PRN

## 2024-03-13 MED ORDER — ROCURONIUM BROMIDE 10 MG/ML (PF) SYRINGE
PREFILLED_SYRINGE | INTRAVENOUS | Status: AC
  Filled 2024-03-13: qty 10

## 2024-03-13 MED ORDER — PHENYLEPHRINE HCL (PRESSORS) 10 MG/ML IV SOLN
INTRAVENOUS | Status: DC | PRN
  Administered 2024-03-13 (×2): 160 ug via INTRAVENOUS

## 2024-03-13 MED ORDER — MIDAZOLAM HCL 2 MG/2ML IJ SOLN
INTRAMUSCULAR | Status: DC | PRN
  Administered 2024-03-13: 2 mg via INTRAVENOUS

## 2024-03-13 MED ORDER — SODIUM CHLORIDE 0.9 % IV SOLN: 12.5000 mg | INTRAVENOUS | Status: DC | PRN

## 2024-03-13 MED ORDER — DEXAMETHASONE SODIUM PHOSPHATE 10 MG/ML IJ SOLN
INTRAMUSCULAR | Status: AC
  Filled 2024-03-13: qty 1

## 2024-03-13 MED ORDER — IOHEXOL 300 MG/ML  SOLN
125.0000 mL | Freq: Once | INTRAMUSCULAR | Status: AC | PRN
  Administered 2024-03-13: 125 mL via INTRAVENOUS

## 2024-03-13 MED ORDER — VASOPRESSIN 20 UNIT/ML IV SOLN
INTRAVENOUS | Status: AC
  Filled 2024-03-13: qty 1

## 2024-03-13 MED ORDER — PHENYLEPHRINE HCL-NACL 20-0.9 MG/250ML-% IV SOLN
INTRAVENOUS | Status: DC | PRN
Start: 2024-03-13 — End: 2024-03-13
  Administered 2024-03-13: 40 ug/min via INTRAVENOUS

## 2024-03-13 MED ORDER — MIDAZOLAM HCL 2 MG/2ML IJ SOLN
INTRAMUSCULAR | Status: AC
  Filled 2024-03-13: qty 2

## 2024-03-13 MED ORDER — MORPHINE SULFATE (PF) 4 MG/ML IV SOLN
4.0000 mg | Freq: Once | INTRAVENOUS | Status: AC
  Administered 2024-03-13: 4 mg via INTRAVENOUS
  Filled 2024-03-13: qty 1

## 2024-03-13 MED ORDER — LACTATED RINGERS IV BOLUS (SEPSIS)
1000.0000 mL | Freq: Once | INTRAVENOUS | Status: AC
  Administered 2024-03-13: 1000 mL via INTRAVENOUS

## 2024-03-13 MED ORDER — VANCOMYCIN HCL IN DEXTROSE 1-5 GM/200ML-% IV SOLN
1000.0000 mg | Freq: Once | INTRAVENOUS | Status: AC
  Administered 2024-03-13: 1000 mg via INTRAVENOUS
  Filled 2024-03-13: qty 200

## 2024-03-13 MED ORDER — ROCURONIUM 10MG/ML (10ML) SYRINGE FOR MEDFUSION PUMP - OPTIME
INTRAVENOUS | Status: DC | PRN
  Administered 2024-03-13: 20 mg via INTRAVENOUS

## 2024-03-13 MED ORDER — LIDOCAINE 2% (20 MG/ML) 5 ML SYRINGE
INTRAMUSCULAR | Status: AC
Start: 2024-03-13 — End: 2024-03-13
  Filled 2024-03-13: qty 5

## 2024-03-13 MED ORDER — CLINDAMYCIN PHOSPHATE 600 MG/50ML IV SOLN
600.0000 mg | Freq: Once | INTRAVENOUS | Status: AC
  Administered 2024-03-13: 600 mg via INTRAVENOUS
  Filled 2024-03-13: qty 50

## 2024-03-13 MED ORDER — ACETAMINOPHEN 325 MG PO TABS: 650.0000 mg | ORAL_TABLET | Freq: Four times a day (QID) | ORAL | Status: DC | PRN

## 2024-03-13 MED ORDER — SODIUM CHLORIDE (PF) 0.9 % IJ SOLN
INTRAMUSCULAR | Status: AC
  Filled 2024-03-13: qty 20

## 2024-03-13 MED ORDER — SUGAMMADEX SODIUM 200 MG/2ML IV SOLN
INTRAVENOUS | Status: DC | PRN
  Administered 2024-03-13: 300 mg via INTRAVENOUS

## 2024-03-13 MED ORDER — ONDANSETRON HCL 4 MG/2ML IJ SOLN
4.0000 mg | Freq: Four times a day (QID) | INTRAMUSCULAR | Status: DC | PRN
  Administered 2024-03-22: 4 mg via INTRAVENOUS
  Filled 2024-03-13: qty 2

## 2024-03-13 MED ORDER — BUPIVACAINE-EPINEPHRINE (PF) 0.25% -1:200000 IJ SOLN
INTRAMUSCULAR | Status: AC
Start: 2024-03-13 — End: 2024-03-13
  Filled 2024-03-13: qty 30

## 2024-03-13 MED ORDER — AMISULPRIDE (ANTIEMETIC) 5 MG/2ML IV SOLN: 10.0000 mg | Freq: Once | INTRAVENOUS | Status: DC | PRN

## 2024-03-13 MED ORDER — LACTATED RINGERS IV BOLUS (SEPSIS): 800.0000 mL | Freq: Once | INTRAVENOUS | Status: AC

## 2024-03-13 MED ORDER — LACTATED RINGERS IV SOLN
150.0000 mL/h | INTRAVENOUS | Status: AC
  Administered 2024-03-14 (×6): 150 mL/h via INTRAVENOUS

## 2024-03-13 MED ORDER — PROPOFOL 10 MG/ML IV BOLUS
INTRAVENOUS | Status: DC | PRN
  Administered 2024-03-13: 200 mg via INTRAVENOUS

## 2024-03-13 MED ORDER — LIDOCAINE HCL (CARDIAC) PF 100 MG/5ML IV SOSY
PREFILLED_SYRINGE | INTRAVENOUS | Status: DC | PRN
  Administered 2024-03-13: 60 mg via INTRAVENOUS

## 2024-03-13 MED ORDER — FENTANYL CITRATE (PF) 100 MCG/2ML IJ SOLN
25.0000 ug | INTRAMUSCULAR | Status: DC | PRN
  Administered 2024-03-14: 25 ug via INTRAVENOUS

## 2024-03-13 MED ORDER — ACETAMINOPHEN 650 MG RE SUPP: 650.0000 mg | Freq: Four times a day (QID) | RECTAL | Status: DC | PRN

## 2024-03-13 MED ORDER — FENTANYL CITRATE (PF) 250 MCG/5ML IJ SOLN
INTRAMUSCULAR | Status: AC
  Filled 2024-03-13: qty 5

## 2024-03-13 MED ORDER — LINEZOLID 600 MG/300ML IV SOLN
600.0000 mg | Freq: Two times a day (BID) | INTRAVENOUS | Status: DC
  Administered 2024-03-13 – 2024-03-15 (×9): 600 mg via INTRAVENOUS
  Filled 2024-03-13 (×6): qty 300

## 2024-03-13 MED ORDER — 0.9 % SODIUM CHLORIDE (POUR BTL) OPTIME
TOPICAL | Status: DC | PRN
Start: 2024-03-13 — End: 2024-03-13
  Administered 2024-03-13: 1000 mL

## 2024-03-13 MED ORDER — ONDANSETRON HCL 4 MG/2ML IJ SOLN
INTRAMUSCULAR | Status: AC
  Filled 2024-03-13: qty 2

## 2024-03-13 MED ORDER — SODIUM CHLORIDE 0.9 % IV SOLN
1.0000 g | Freq: Once | INTRAVENOUS | Status: AC
  Administered 2024-03-14 (×2): 1 g via INTRAVENOUS
  Filled 2024-03-13: qty 20

## 2024-03-13 MED ORDER — SUCCINYLCHOLINE 20MG/ML (10ML) SYRINGE FOR MEDFUSION PUMP - OPTIME
INTRAMUSCULAR | Status: DC | PRN
  Administered 2024-03-13: 130 mg via INTRAVENOUS

## 2024-03-13 SURGICAL SUPPLY — 22 items
BAG COUNTER SPONGE SURGICOUNT (BAG) ×1 IMPLANT
CANISTER SUCTION 3000ML PPV (SUCTIONS) IMPLANT
COVER SURGICAL LIGHT HANDLE (MISCELLANEOUS) ×1 IMPLANT
DRAPE LAPAROTOMY 100X72 PEDS (DRAPES) IMPLANT
GAUZE 4X4 16PLY ~~LOC~~+RFID DBL (SPONGE) ×1 IMPLANT
GLOVE BIO SURGEON STRL SZ7 (GLOVE) ×1 IMPLANT
GLOVE BIOGEL PI IND STRL 7.5 (GLOVE) ×1 IMPLANT
GOWN STRL REUS W/ TWL LRG LVL3 (GOWN DISPOSABLE) ×1 IMPLANT
GOWN STRL REUS W/ TWL XL LVL3 (GOWN DISPOSABLE) ×1 IMPLANT
KIT BASIN OR (CUSTOM PROCEDURE TRAY) ×1 IMPLANT
KIT SIGMOIDOSCOPE (SET/KITS/TRAYS/PACK) IMPLANT
KIT TURNOVER KIT B (KITS) ×1 IMPLANT
NDL HYPO 25GX1X1/2 BEV (NEEDLE) IMPLANT
NEEDLE HYPO 25GX1X1/2 BEV (NEEDLE) IMPLANT
NS IRRIG 1000ML POUR BTL (IV SOLUTION) ×1 IMPLANT
PACK GENERAL/GYN (CUSTOM PROCEDURE TRAY) ×1 IMPLANT
PACK LITHOTOMY IV (CUSTOM PROCEDURE TRAY) IMPLANT
PAD ABD 8X10 STRL (GAUZE/BANDAGES/DRESSINGS) IMPLANT
PAD ARMBOARD POSITIONER FOAM (MISCELLANEOUS) ×2 IMPLANT
SURGILUBE 2OZ TUBE FLIPTOP (MISCELLANEOUS) ×1 IMPLANT
SYR CONTROL 10ML LL (SYRINGE) IMPLANT
TOWEL GREEN STERILE (TOWEL DISPOSABLE) ×1 IMPLANT

## 2024-03-13 NOTE — MAU Note (Signed)
 Pt transferred from [facility] with labia abscess/ sepsis. Reports received from [facility]. PT a/a/ox4. Reports mild discomfort to vag area at this time. V/SS. Here for surgical consult and IV Antibiotic therapy. Attempted t o start second IV  unable . IV consult ordered.

## 2024-03-13 NOTE — Anesthesia Procedure Notes (Signed)
 Procedure Name: Intubation Date/Time: 03/13/2024 10:42 PM  Performed by: Vaughn Zebedee HERO, CRNAPre-anesthesia Checklist: Patient identified, Emergency Drugs available, Suction available and Patient being monitored Patient Re-evaluated:Patient Re-evaluated prior to induction Oxygen Delivery Method: Circle system utilized Preoxygenation: Pre-oxygenation with 100% oxygen Induction Type: IV induction and Rapid sequence Laryngoscope Size: Miller and 2 Grade View: Grade I Tube type: Oral Tube size: 7.5 mm Number of attempts: 1 Airway Equipment and Method: Stylet Placement Confirmation: ETT inserted through vocal cords under direct vision, positive ETCO2 and breath sounds checked- equal and bilateral Secured at: 22 cm Tube secured with: Tape Dental Injury: Teeth and Oropharynx as per pre-operative assessment

## 2024-03-13 NOTE — MAU Provider Note (Signed)
 Event Date/Time   First Provider Initiated Contact with Patient 03/13/24 2030      S Ms. KALIMA SAYLOR is a 43 y.o. [phone] non-pregnant female who presents to MAU today with complaint of transfer from [facility] ED for concern for necrotizing fascitis.  Pt states noticed small area on labia on 03/07/24 with sweats so was seen on 8/7 given worsening lesion and pain.  Keflex  was sent and started 8/8.  She presented to [facility] ED and deemed to be septic and started on fluids and Vanc/Cefepime /Clinda. Here is VSS, SORA.  Sepesis protocol activated and GenSurg paged for consult. OBGYN here at time of arrival.   Pertinent items noted in HPI and remainder of comprehensive ROS otherwise negative.   O BP 127/79 (BP Location: Left Arm)   Pulse (!) 120   Temp (!) 100.4 F (38 C) (Oral)   Resp 19   Ht 5' 5 (1.651 m)   Wt 135.6 kg   LMP 03/09/2024   SpO2 95%   BMI 49.75 kg/m  Physical Exam Vitals and nursing note reviewed. Exam conducted with a chaperone present.  Constitutional:      General: She is not in acute distress.    Appearance: She is obese. She is not ill-appearing, toxic-appearing or diaphoretic.  HENT:     Head: Normocephalic and atraumatic.     Right Ear: External ear normal.     Left Ear: External ear normal.     Nose: Nose normal. No congestion.     Mouth/Throat:     Mouth: Mucous membranes are moist.     Pharynx: Oropharynx is clear.  Eyes:     Extraocular Movements: Extraocular movements intact.     Conjunctiva/sclera: Conjunctivae normal.  Cardiovascular:     Rate and Rhythm: Tachycardia present.  Pulmonary:     Effort: Pulmonary effort is normal. No respiratory distress.  Abdominal:     General: Abdomen is flat. There is no distension.     Palpations: Abdomen is soft.     Tenderness: There is no abdominal tenderness.  Genitourinary:    Vagina: No vaginal discharge.     Comments: Swollen L labia majora and mons with some spread to inner L thigh with areas of  alternating softness and induration, no apparent drainage Musculoskeletal:        General: Normal range of motion.     Cervical back: Normal range of motion.     Right lower leg: No edema.     Left lower leg: No edema.  Skin:    General: Skin is warm and dry.  Neurological:     Mental Status: She is alert and oriented to person, place, and time. Mental status is at baseline.  Psychiatric:        Mood and Affect: Mood normal.        Behavior: Behavior normal.   EKG sinus tachycardia with ventricular rate of 120s   MDM: MAU Course:  Pt arrived in stable condition but ordered additional fluids to account for 30mg /kg and switched abx to the sepsis protocol recommended for NecFas and PCN allergy given hx of rash and SOB with PCN in last pregnancy. Gen surg and OBGYN at bedside as pt informed of sepesis, severity of infection and liver concerns.  She is consented for emergent surgery.  Consult made to hospitalist team for admission given co morbidities.    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

## 2024-03-13 NOTE — Sepsis Progress Note (Addendum)
 Elink following for sepsis protocol, Sepsis called 2056, initial lactic 5.2, come down to 4.3, abx's given 17:40, approp fluids ordered

## 2024-03-13 NOTE — Anesthesia Preprocedure Evaluation (Addendum)
 Anesthesia Evaluation  Patient identified by MRN, date of birth, ID band Patient awake    Reviewed: Allergy & Precautions, NPO status , Patient's Chart, lab work & pertinent test results  History of Anesthesia Complications Negative for: history of anesthetic complications  Airway Mallampati: I  TM Distance: >3 FB Neck ROM: Full    Dental  (+) Dental Advisory Given, Teeth Intact   Pulmonary asthma , sleep apnea , former smoker   Pulmonary exam normal        Cardiovascular hypertension, Pt. on medications Normal cardiovascular exam     Neuro/Psych  Headaches PSYCHIATRIC DISORDERS Anxiety Depression       GI/Hepatic Neg liver ROS,GERD  Controlled and Medicated,,  Endo/Other  diabetes, Poorly Controlled, Type 2, Insulin  Dependent, Oral Hypoglycemic Agents  Class 4 obesity  Renal/GU negative Renal ROS     Musculoskeletal negative musculoskeletal ROS (+)    Abdominal  (+) + obese  Peds  Hematology  (+) Blood dyscrasia, anemia   Anesthesia Other Findings On GLP-1a   Reproductive/Obstetrics                              Anesthesia Physical Anesthesia Plan  ASA: 3 and emergent  Anesthesia Plan: General   Post-op Pain Management: Ofirmev  IV (intra-op)*   Induction: Intravenous  PONV Risk Score and Plan: 3 and Treatment may vary due to age or medical condition, Ondansetron , Midazolam  and Diphenhydramine   Airway Management Planned: Oral ETT  Additional Equipment: None  Intra-op Plan:   Post-operative Plan: Extubation in OR  Informed Consent: I have reviewed the patients History and Physical, chart, labs and discussed the procedure including the risks, benefits and alternatives for the proposed anesthesia with the patient or authorized representative who has indicated his/her understanding and acceptance.     Dental advisory given  Plan Discussed with: CRNA and  Anesthesiologist  Anesthesia Plan Comments:          Anesthesia Quick Evaluation

## 2024-03-13 NOTE — H&P (Signed)
 History and Physical      [name] FMW:[id number] DOB: Jan 24, 1981 DOA: 03/13/2024; DOS: 03/13/2024  PCP: Georgina Speaks, FNP *** Patient coming from: home ***  I have personally briefly reviewed patient's old medical records in [facility]  Chief Complaint: ***  HPI: [name] is a 43 y.o. female with medical history significant for *** who is admitted to [facility] on 03/13/2024 with *** after presenting from home*** to [facility] ED complaining of ***.    ***       ***   ED Course:  Vital signs in the ED were notable for the following: ***  Labs were notable for the following: ***  Per my interpretation, EKG in ED demonstrated the following:  ***  Imaging in the ED, per corresponding formal radiology read, was notable for the following:  ***  While in the ED, the following were administered: ***  Subsequently, the patient was admitted  ***  ***red    Review of Systems: As per HPI otherwise 10 point review of systems negative.   Past Medical History:  Diagnosis Date   Abnormal Pap smear    f/u wnl   Asthma    Fibroid    Gestational diabetes    Hypertension    Morbid obesity (HCC)    Sleep apnea    Urinary tract infection     Past Surgical History:  Procedure Laterality Date   BREAST BIOPSY Right 07/24/2022   US  RT BREAST BX W LOC DEV 1ST LESION IMG BX SPEC US  GUIDE 07/24/2022 [facility]AMMOGRAPHY   CESAREAN SECTION     DILATION AND CURETTAGE OF UTERUS     TUBAL LIGATION      Social History:  reports that she quit smoking about 4 years ago. Her smoking use included cigarettes. She has quit using smokeless tobacco. She reports that she does not drink alcohol and does not use drugs.   Allergies  Allergen Reactions   Other Anaphylaxis and Other (See Comments)    Pt has fish allergy Pt has allergy to butter cookies   Penicillins Rash and Other (See Comments)    Told MD that PCN reaction is rash and that she has taken amoxicillin   before   Bactrim  [Sulfamethoxazole -Trimethoprim ]     Diarrhea, abdominal cramps and rash   Fish Allergy     Other reaction(s): Unknown    Family History  Problem Relation Age of Onset   Diabetes Father    Allergies Mother    Asthma Mother    Hypertension Mother    Diabetes Mother    Diabetes Maternal Grandmother    Heart disease Maternal Grandmother    Anesthesia problems Neg Hx    Hypotension Neg Hx    Malignant hyperthermia Neg Hx    Pseudochol deficiency Neg Hx    Hearing loss Neg Hx     Family history reviewed and not pertinent ***   Prior to Admission medications   Medication Sig Start Date End Date Taking? Authorizing Provider  albuterol  (PROVENTIL ) (5 MG/ML) 0.5% nebulizer solution Take 0.5 mLs (2.5 mg total) by nebulization every 6 (six) hours as needed for wheezing or shortness of breath. 04/15/19   Burky, Natalie B, NP  albuterol  (VENTOLIN  HFA) 108 (90 Base) MCG/ACT inhaler Inhale 1-2 puffs into the lungs every 6 (six) hours as needed for wheezing or shortness of breath. 07/23/20   Wieters, Hallie C, [name]  atorvastatin  (LIPITOR) 20 MG tablet Take 1 tablet (20 mg total) by  mouth daily. 11/03/23   Georgina Speaks, FNP  blood glucose meter kit and supplies KIT Dispense based on patient and insurance preference. Use up to four times daily as directed. (FOR ICD-9 250.00, 250.01). 07/23/20   Wieters, Hallie C, [name]  Blood Glucose Monitoring Suppl (ACCU-CHEK GUIDE) w/Device KIT 1 Device by Does not apply route daily in the afternoon. 05/29/22   Shamleffer, Ibtehal Jaralla, MD  cephALEXin  (KEFLEX ) 500 MG capsule Take 1 capsule (500 mg total) by mouth 4 (four) times daily. 03/11/24   Georgina Speaks, FNP  cetirizine  (ZYRTEC  ALLERGY) 10 MG tablet Take 1 tablet (10 mg total) by mouth daily. 11/02/23   Georgina Speaks, FNP  dapagliflozin  propanediol (FARXIGA ) 10 MG TABS tablet Take 1 tablet (10 mg total) by mouth daily. 08/28/23   Shamleffer, Ibtehal Jaralla, MD  diclofenac  (VOLTAREN ) 75 MG EC  tablet Take 1 tablet (75 mg total) by mouth 2 (two) times daily. 02/25/24   Magdalen Pasco RAMAN, DPM  Dulaglutide  (TRULICITY ) 1.5 MG/0.5ML SOAJ Inject 1.5 mg into the skin once a week. 11/04/23   Shamleffer, Donell Cardinal, MD  ferrous sulfate  325 (65 FE) MG EC tablet Take 1 tablet (325 mg total) by mouth 3 (three) times daily with meals. 10/31/19 03/10/24  Rodriguez-Southworth, Sylvia, [name]  fluconazole  (DIFLUCAN ) 100 MG tablet Take one tablet at on set of symptoms then repeat in 5 days 03/11/24   Georgina Speaks, FNP  fluticasone  (FLONASE ) 50 MCG/ACT nasal spray Place 1-2 sprays into both nostrils daily. 04/18/21   Ghumman, Ramandeep, NP  glucose blood (ACCU-CHEK GUIDE) test strip 1 each by Other route daily in the afternoon. Use as instructed 05/29/22   Shamleffer, Ibtehal Jaralla, MD  Insulin  Glargine (BASAGLAR  KWIKPEN) 100 UNIT/ML Inject 40 Units into the skin daily. 11/05/23   Shamleffer, Donell Cardinal, MD  Insulin  Pen Needle 31G X 8 MM MISC 1 Device by Does not apply route daily. 08/28/23   Shamleffer, Ibtehal Jaralla, MD  Lancets (ACCU-CHEK MULTICLIX) lancets USE TO CHECK BLOOD SUGARS UP TO 4 TIMES DAILY  DX CODE:E11.9 05/29/22   Shamleffer, Donell Cardinal, MD  levonorgestrel  (MIRENA ) 20 MCG/24HR IUD 1 each by Intrauterine route once.    [provider]  losartan -hydrochlorothiazide  (HYZAAR) 50-12.5 MG tablet TAKE 1 TABLET BY MOUTH EVERY DAY 01/14/24   Georgina Speaks, FNP  metFORMIN  (GLUCOPHAGE -XR) 750 MG 24 hr tablet Take 2 tablets (1,500 mg total) by mouth daily with breakfast. 08/28/23   Shamleffer, Ibtehal Jaralla, MD  Multiple Vitamin (MULTIVITAMIN) capsule Take 1 capsule by mouth daily.    [provider]  propranolol  (INDERAL ) 10 MG tablet TAKE 1 TABLET(10 MG) BY MOUTH EVERY EVENING. No refills until pt is seen. 03/03/22   Moore, Janece, FNP  sertraline  (ZOLOFT ) 50 MG tablet Take 1 1/2 tablet by mouth daily 11/02/23   Georgina Speaks, FNP  terconazole  (TERAZOL 7 ) 0.4 % vaginal cream Place  1 applicator vaginally at bedtime. 06/24/22   Zina Jerilynn LABOR, MD  triamcinolone  (KENALOG ) 0.025 % ointment Apply 1 application topically 2 (two) times daily. 04/18/21   Pura Brittle, NP     Objective    Physical Exam: Vitals:   03/13/24 1840 03/13/24 1845 03/13/24 1915 03/13/24 2015  BP:  (!) 140/84 (!) 143/76 127/79  Pulse:  (!) 103 (!) 106 (!) 120  Resp:  13 19   Temp: 99.2 F (37.3 C)   (!) 100.4 F (38 C)  TempSrc: Oral   Oral  SpO2:  98% 98% 95%  Weight:  Height:        General: appears to be stated age; alert, oriented Skin: warm, dry, no rash Head:  AT/[place] Mouth:  Oral mucosa membranes appear moist, normal dentition Neck: supple; trachea midline Heart:  RRR; did not appreciate any M/R/G Lungs: CTAB, did not appreciate any wheezes, rales, or rhonchi Abdomen: + BS; soft, ND, NT Vascular: 2+ pedal pulses b/l; 2+ radial pulses b/l Extremities: no peripheral edema, no muscle wasting Neuro: strength and sensation intact in upper and lower extremities b/l ***   *** Neuro: 5/5 strength of the proximal and distal flexors and extensors of the upper and lower extremities bilaterally; sensation intact in upper and lower extremities b/l; cranial nerves II through XII grossly intact; no pronator drift; no evidence suggestive of slurred speech, dysarthria, or facial droop; Normal muscle tone. No tremors.  *** Neuro: In the setting of the patient's current mental status and associated inability to follow instructions, unable to perform full neurologic exam at this time.  As such, assessment of strength, sensation, and cranial nerves is limited at this time. Patient noted to spontaneously move all 4 extremities. No tremors.  ***    Labs on Admission: I have personally reviewed following labs and imaging studies  CBC: Recent Labs  Lab 03/13/24 1633 03/13/24 2139  WBC 21.6* 25.1*  NEUTROABS 17.7* PENDING  HGB 11.7* 10.8*  HCT 36.7 34.0*  MCV 82.3 82.1  PLT 496*  394   Basic Metabolic Panel: Recent Labs  Lab 03/10/24 1458 03/13/24 1633  NA 136 132*  K 5.3* 3.9  CL 96 94*  CO2 19* 21*  GLUCOSE 108* 348*  BUN 33* 29*  CREATININE 1.14* 1.01*  CALCIUM  11.2* 12.1*   GFR: Estimated Creatinine Clearance: 101.3 mL/min (A) (by C-G formula based on SCr of 1.01 mg/dL (H)). Liver Function Tests: No results for input(s): AST, ALT, ALKPHOS, BILITOT, PROT, ALBUMIN  in the last 168 hours. No results for input(s): LIPASE, AMYLASE in the last 168 hours. No results for input(s): AMMONIA in the last 168 hours. Coagulation Profile: Recent Labs  Lab 03/13/24 2139  INR 1.3*   Cardiac Enzymes: No results for input(s): CKTOTAL, CKMB, CKMBINDEX, TROPONINI in the last 168 hours. BNP (last 3 results) No results for input(s): PROBNP in the last 8760 hours. HbA1C: No results for input(s): HGBA1C in the last 72 hours. CBG: No results for input(s): GLUCAP in the last 168 hours. Lipid Profile: No results for input(s): CHOL, HDL, LDLCALC, TRIG, CHOLHDL, LDLDIRECT in the last 72 hours. Thyroid Function Tests: No results for input(s): TSH, T4TOTAL, FREET4, T3FREE, THYROIDAB in the last 72 hours. Anemia Panel: No results for input(s): VITAMINB12, FOLATE, FERRITIN, TIBC, IRON , RETICCTPCT in the last 72 hours. Urine analysis:    Component Value Date/Time   COLORURINE YELLOW 03/04/2022 1706   APPEARANCEUR HAZY (A) 03/04/2022 1706   LABSPEC >=1.030 03/04/2022 1706   PHURINE 7.0 03/04/2022 1706   GLUCOSEU >=500 (A) 03/04/2022 1706   HGBUR NEGATIVE 03/04/2022 1706   BILIRUBINUR small (A) 05/04/2023 1642   BILIRUBINUR Negative 04/28/2022 1605   KETONESUR negative 05/04/2023 1642   KETONESUR NEGATIVE 03/04/2022 1706   PROTEINUR Negative 04/28/2022 1605   PROTEINUR NEGATIVE 03/04/2022 1706   UROBILINOGEN 1.0 05/04/2023 1642   UROBILINOGEN 0.2 06/18/2015 1259   NITRITE Negative 05/04/2023 1642    NITRITE Negative 04/28/2022 1605   NITRITE NEGATIVE 03/04/2022 1706   LEUKOCYTESUR Negative 05/04/2023 1642   LEUKOCYTESUR NEGATIVE 03/04/2022 1706    Radiological Exams on Admission: CT ABDOMEN  PELVIS W CONTRAST Result Date: 03/13/2024 CLINICAL DATA:  Abdominal pain, acute, nonlocalized Large labial abscess Abscess on buttock, enlarged to upper thigh, first noticed 6 days ago. Hx fibroid, UTI, morbid obesity, cesarean section, and tubal ligation. EXAM: CT ABDOMEN AND PELVIS WITH CONTRAST TECHNIQUE: Multidetector CT imaging of the abdomen and pelvis was performed using the standard protocol following bolus administration of intravenous contrast. RADIATION DOSE REDUCTION: This exam was performed according to the departmental dose-optimization program which includes automated exposure control, adjustment of the mA and/or kV according to patient size and/or use of iterative reconstruction technique. CONTRAST:  OMNIPAQUE  IOHEXOL  300 MG/ML  SOLN COMPARISON:  CT renal 03/04/2022 FINDINGS: Lower chest: Partially visualized bilateral lower lobe cluster of pulmonary nodules/patchy airspace opacities. Hepatobiliary: Nodular hepatic contour with innumerable subcentimeter and pericentimeter hypodense lesions. No focal liver abnormality. No gallstones, gallbladder wall thickening, or pericholecystic fluid. No biliary dilatation. Pancreas: No focal lesion. Normal pancreatic contour. No surrounding inflammatory changes. No main pancreatic ductal dilatation. Spleen: The spleen is enlarged in caliber measuring up to 15 cm. No focal lesion. Adrenals/Urinary Tract: No adrenal nodule bilaterally. Bilateral kidneys enhance symmetrically. No hydronephrosis. No hydroureter. Right nephrolithiasis measuring up to 3 mm. No left nephrolithiasis. No ureterolithiasis bilaterally. The urinary bladder is unremarkable. Stomach/Bowel: Stomach is within normal limits. No evidence of bowel wall thickening or dilatation. Appendix appears  normal. Vascular/Lymphatic: No abdominal aorta or iliac aneurysm. Mild atherosclerotic plaque of the aorta and its branches. No abdominal, pelvic, or inguinal lymphadenopathy. Reproductive: T-shaped intrauterine vice within the lower uterine segment. Redemonstration of a stable peripherally calcified left adnexal region mass that may represent a fundal fibroid versus ovarian lesion. Otherwise uterus and bilateral adnexa are unremarkable. Other: No intraperitoneal free fluid. No intraperitoneal free gas. No organized fluid collection. Musculoskeletal: Diffuse left labial subcutaneus soft tissue edema and emphysema. No organized fluid collection. Finding extends to the inferior portion of the mons pubis and superior most medial left thigh. Overlying dermal thickening. No suspicious lytic or blastic osseous lesions. No acute displaced fracture. Multilevel degenerative changes of the spine. IMPRESSION: 1. Diffuse left labial subcutaneus soft tissue edema and emphysema. No organized fluid collection. Finding extends to the inferior portion of the mons pubis and superior most medial left thigh. Overlying dermal thickening. 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 Correlate clinically as this is a clinical diagnosis. 2. Partially visualized bilateral lower lobe cluster of pulmonary nodules/patchy airspace opacities. Findings suggestive of infection/inflammation. Correlate with chest x-ray PA and lateral view for further evaluation. Other imaging findings of potential clinical significance: 1. Cirrhosis with portal hypertension. Innumerable underlying masses suggestive of hepatocellular carcinoma. No focal liver lesions identified. Please note that liver protocol enhanced MR and CT are the most sensitive tests for the screening detection of hepatocellular carcinoma in the high risk setting of cirrhosis. 2. 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 3. Nonobstructive right nephrolithiasis. Electronically Signed    By: Morgane  Naveau M.D.   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 [facility]) Active Problems:   Depression with anxiety   Essential hypertension   Morbid obesity with body mass index (BMI) of 50.0 to 59.9 in adult [facility])   Type 2 diabetes mellitus with diabetic microalbuminuria, with long-term current use of insulin  (HCC)   Hyperglycemia   Necrotizing fasciitis (HCC)   ***            ***                  ***                   ***                  ***                  ***                  ***                   ***                  ***                  ***                  ***                  ***                 ***                ***  DVT prophylaxis: SCD's ***  Code Status: Full code*** Family Communication: none*** Disposition Plan: Per Rounding Team Consults called: none***;  Admission status: ***     I SPENT GREATER THAN 75 *** MINUTES IN CLINICAL CARE TIME/MEDICAL DECISION-MAKING IN COMPLETING THIS ADMISSION.      Eva NOVAK Itzell Bendavid DO [facility]  From 7PM - 7AM   03/13/2024, 10:09 PM   ***

## 2024-03-13 NOTE — Consult Note (Signed)
 [name] 05/25/1981  [id number].    Requesting MD: Fredirick Chief Complaint/Reason for Consult: Labial abscess, c/f nec fasc  HPI:  43 y/o F who presented to the ED with labial swelling, fevers/chills, and pain. She reports that she initially presented to her PCP 6 days ago and was prescribed Keflex . When her symptoms continued to worsen throughout the week she decided to present for evaluation. CT was performed and notable for a moderate volume of air with the subcutaneous tissue near the left labia and perineum as well as innumerable liver lesions, pulmonary nodules noted within the lower lobes, and splenomegaly.   Labs notable for Na 132, Glucose 328, Cr 1, Lactate 4.3, WBC 22  On exam, patient is resting in bed. NAD.   ROS: Review of Systems  Constitutional: Negative.   HENT: Negative.    Eyes: Negative.   Respiratory: Negative.    Cardiovascular: Negative.   Gastrointestinal: Negative.   Genitourinary: Negative.   Musculoskeletal: Negative.   Skin:        L labial swelling  Neurological: Negative.   Endo/Heme/Allergies: Negative.   Psychiatric/Behavioral: Negative.      Family History  Problem Relation Age of Onset   Diabetes Father    Allergies Mother    Asthma Mother    Hypertension Mother    Diabetes Mother    Diabetes Maternal Grandmother    Heart disease Maternal Grandmother    Anesthesia problems Neg Hx    Hypotension Neg Hx    Malignant hyperthermia Neg Hx    Pseudochol deficiency Neg Hx    Hearing loss Neg Hx     Past Medical History:  Diagnosis Date   Abnormal Pap smear    f/u wnl   Asthma    Fibroid    Gestational diabetes    Hypertension    Morbid obesity (HCC)    Sleep apnea    Urinary tract infection     Past Surgical History:  Procedure Laterality Date   BREAST BIOPSY Right 07/24/2022   US  RT BREAST BX W LOC DEV 1ST LESION IMG BX SPEC US  GUIDE 07/24/2022 [facility]   CESAREAN SECTION     DILATION AND CURETTAGE OF  UTERUS     TUBAL LIGATION      Social History:  reports that she quit smoking about 4 years ago. Her smoking use included cigarettes. She has quit using smokeless tobacco. She reports that she does not drink alcohol and does not use drugs.  Allergies:  Allergies  Allergen Reactions   Other Anaphylaxis and Other (See Comments)    Pt has fish allergy Pt has allergy to butter cookies   Penicillins Rash and Other (See Comments)    Told MD that PCN reaction is rash and that she has taken amoxicillin  before   Bactrim  [Sulfamethoxazole -Trimethoprim ]     Diarrhea, abdominal cramps and rash   Fish Allergy     Other reaction(s): Unknown    Medications Prior to Admission  Medication Sig Dispense Refill   albuterol  (PROVENTIL ) (5 MG/ML) 0.5% nebulizer solution Take 0.5 mLs (2.5 mg total) by nebulization every 6 (six) hours as needed for wheezing or shortness of breath. 20 mL 12   albuterol  (VENTOLIN  HFA) 108 (90 Base) MCG/ACT inhaler Inhale 1-2 puffs into the lungs every 6 (six) hours as needed for wheezing or shortness of breath. 18 g 0   atorvastatin  (LIPITOR) 20 MG tablet Take 1 tablet (20 mg total) by mouth daily. 90 tablet 1  blood glucose meter kit and supplies KIT Dispense based on patient and insurance preference. Use up to four times daily as directed. (FOR ICD-9 250.00, 250.01). 1 each 0   Blood Glucose Monitoring Suppl (ACCU-CHEK GUIDE) w/Device KIT 1 Device by Does not apply route daily in the afternoon. 1 kit 0   cephALEXin  (KEFLEX ) 500 MG capsule Take 1 capsule (500 mg total) by mouth 4 (four) times daily. 40 capsule 0   cetirizine  (ZYRTEC  ALLERGY) 10 MG tablet Take 1 tablet (10 mg total) by mouth daily. 90 tablet 2   dapagliflozin  propanediol (FARXIGA ) 10 MG TABS tablet Take 1 tablet (10 mg total) by mouth daily. 90 tablet 2   diclofenac  (VOLTAREN ) 75 MG EC tablet Take 1 tablet (75 mg total) by mouth 2 (two) times daily. 50 tablet 2   Dulaglutide  (TRULICITY ) 1.5 MG/0.5ML SOAJ  Inject 1.5 mg into the skin once a week. 6 mL 3   ferrous sulfate  325 (65 FE) MG EC tablet Take 1 tablet (325 mg total) by mouth 3 (three) times daily with meals. 90 tablet 2   fluconazole  (DIFLUCAN ) 100 MG tablet Take one tablet at on set of symptoms then repeat in 5 days 2 tablet 0   fluticasone  (FLONASE ) 50 MCG/ACT nasal spray Place 1-2 sprays into both nostrils daily. 16 g 0   glucose blood (ACCU-CHEK GUIDE) test strip 1 each by Other route daily in the afternoon. Use as instructed 100 each 3   Insulin  Glargine (BASAGLAR  KWIKPEN) 100 UNIT/ML Inject 40 Units into the skin daily. 45 mL 3   Insulin  Pen Needle 31G X 8 MM MISC 1 Device by Does not apply route daily. 100 each 2   Lancets (ACCU-CHEK MULTICLIX) lancets USE TO CHECK BLOOD SUGARS UP TO 4 TIMES DAILY  DX CODE:E11.9 100 each 12   levonorgestrel  (MIRENA ) 20 MCG/24HR IUD 1 each by Intrauterine route once.     losartan -hydrochlorothiazide  (HYZAAR) 50-12.5 MG tablet TAKE 1 TABLET BY MOUTH EVERY DAY 90 tablet 1   metFORMIN  (GLUCOPHAGE -XR) 750 MG 24 hr tablet Take 2 tablets (1,500 mg total) by mouth daily with breakfast. 180 tablet 2   Multiple Vitamin (MULTIVITAMIN) capsule Take 1 capsule by mouth daily.     propranolol  (INDERAL ) 10 MG tablet TAKE 1 TABLET(10 MG) BY MOUTH EVERY EVENING. No refills until pt is seen. 30 tablet 0   sertraline  (ZOLOFT ) 50 MG tablet Take 1 1/2 tablet by mouth daily 145 tablet 1   terconazole  (TERAZOL 7 ) 0.4 % vaginal cream Place 1 applicator vaginally at bedtime. 45 g 0   triamcinolone  (KENALOG ) 0.025 % ointment Apply 1 application topically 2 (two) times daily. 30 g 0    Physical Exam: Blood pressure 127/79, pulse (!) 120, temperature (!) 100.4 F (38 C), temperature source Oral, resp. rate 19, height 5' 5 (1.651 m), weight 135.6 kg, last menstrual period 03/09/2024, SpO2 95%. Gen: female, NAD GU: left labial swelling tracking toward the perineum with a focal area of murky drainage, no obvious  crepitus  Results for orders placed or performed during the hospital encounter of 03/13/24 (from the past 48 hours)  CBC with Differential     Status: Abnormal   Collection Time: 03/13/24  4:33 PM  Result Value Ref Range   WBC 21.6 (H) 4.0 - 10.5 K/uL   RBC 4.46 3.87 - 5.11 MIL/uL   Hemoglobin 11.7 (L) 12.0 - 15.0 g/dL   HCT 63.2 63.9 - 53.9 %   MCV 82.3 80.0 - 100.0 fL  MCH 26.2 26.0 - 34.0 pg   MCHC 31.9 30.0 - 36.0 g/dL   RDW 82.9 (H) 88.4 - 84.4 %   Platelets 496 (H) 150 - 400 K/uL   nRBC 0.0 0.0 - 0.2 %   Neutrophils Relative % 82 %   Neutro Abs 17.7 (H) 1.7 - 7.7 K/uL   Lymphocytes Relative 3 %   Lymphs Abs 0.7 0.7 - 4.0 K/uL   Monocytes Relative 11 %   Monocytes Absolute 2.3 (H) 0.1 - 1.0 K/uL   Eosinophils Relative 1 %   Eosinophils Absolute 0.2 0.0 - 0.5 K/uL   Basophils Relative 0 %   Basophils Absolute 0.1 0.0 - 0.1 K/uL   WBC Morphology Mild Left Shift (1-5% metas, occ myelo)    RBC Morphology MORPHOLOGY UNREMARKABLE    Smear Review Normal platelet morphology    Immature Granulocytes 3 %   Abs Immature Granulocytes 0.65 (H) 0.00 - 0.07 K/uL    Comment: Performed at [facility], [address] [place], [place] [address]  Basic metabolic panel     Status: Abnormal   Collection Time: 03/13/24  4:33 PM  Result Value Ref Range   Sodium 132 (L) 135 - 145 mmol/L   Potassium 3.9 3.5 - 5.1 mmol/L    Comment: HEMOLYSIS AT THIS LEVEL MAY AFFECT RESULT   Chloride 94 (L) 98 - 111 mmol/L   CO2 21 (L) 22 - 32 mmol/L   Glucose, Bld 348 (H) 70 - 99 mg/dL    Comment: Glucose reference range applies only to samples taken after fasting for at least 8 hours.   BUN 29 (H) 6 - 20 mg/dL   Creatinine, Ser 8.98 (H) 0.44 - 1.00 mg/dL   Calcium  12.1 (H) 8.9 - 10.3 mg/dL   GFR, Estimated >39 >39 mL/min    Comment: (NOTE) Calculated using the CKD-EPI Creatinine Equation (2021)    Anion gap 17 (H) 5 - 15    Comment: Performed at [facility], [address] [place], [place] [address]  hCG, quantitative, pregnancy     Status: None   Collection Time: 03/13/24  4:33 PM  Result Value Ref Range   hCG, Beta Chain, Quant, S <1 <5 mIU/mL    Comment:          GEST. AGE      CONC.  (mIU/mL)   <=1 WEEK        5 - 50     2 WEEKS       50 - 500     3 WEEKS       100 - 10,000     4 WEEKS     1,000 - 30,000     5 WEEKS     3,500 - 115,000   6-8 WEEKS     12,000 - 270,000    12 WEEKS     15,000 - 220,000        FEMALE AND NON-PREGNANT FEMALE:     LESS THAN 5 mIU/mL Performed at [facility], [address] [place], [place] [address]   Lactic acid, plasma     Status: Abnormal   Collection Time: 03/13/24  4:34 PM  Result Value Ref Range   Lactic Acid, Venous 5.2 (HH) 0.5 - 1.9 mmol/L    Comment: Critical Value, Read Back and verified with Erla Haste, RN at 1741 on 03/13/2024 by SU Performed at [facility], [address] [place], [place] [address]   Lactic acid, plasma     Status: Abnormal   Collection Time: 03/13/24  6:35 PM  Result Value Ref Range   Lactic Acid, Venous 4.3 (HH) 0.5 - 1.9 mmol/L    Comment: Critical value noted. Value is consistent with previously reported and called value  Performed at [facility], [address] [place], [place] [address]    CT ABDOMEN PELVIS W CONTRAST Result Date: 03/13/2024 CLINICAL DATA:  Abdominal pain, acute, nonlocalized Large labial abscess Abscess on buttock, enlarged to upper thigh, first noticed 6 days ago. Hx fibroid, UTI, morbid obesity, cesarean section, and tubal ligation. EXAM: CT ABDOMEN AND PELVIS WITH CONTRAST TECHNIQUE: Multidetector CT imaging of the abdomen and pelvis was performed using the standard protocol following bolus administration of intravenous contrast. RADIATION DOSE REDUCTION: This exam was performed according to the departmental dose-optimization program which includes automated exposure control, adjustment of the mA and/or kV according to  patient size and/or use of iterative reconstruction technique. CONTRAST:  OMNIPAQUE  IOHEXOL  300 MG/ML  SOLN COMPARISON:  CT renal 03/04/2022 FINDINGS: Lower chest: Partially visualized bilateral lower lobe cluster of pulmonary nodules/patchy airspace opacities. Hepatobiliary: Nodular hepatic contour with innumerable subcentimeter and pericentimeter hypodense lesions. No focal liver abnormality. No gallstones, gallbladder wall thickening, or pericholecystic fluid. No biliary dilatation. Pancreas: No focal lesion. Normal pancreatic contour. No surrounding inflammatory changes. No main pancreatic ductal dilatation. Spleen: The spleen is enlarged in caliber measuring up to 15 cm. No focal lesion. Adrenals/Urinary Tract: No adrenal nodule bilaterally. Bilateral kidneys enhance symmetrically. No hydronephrosis. No hydroureter. Right nephrolithiasis measuring up to 3 mm. No left nephrolithiasis. No ureterolithiasis bilaterally. The urinary bladder is unremarkable. Stomach/Bowel: Stomach is within normal limits. No evidence of bowel wall thickening or dilatation. Appendix appears normal. Vascular/Lymphatic: No abdominal aorta or iliac aneurysm. Mild atherosclerotic plaque of the aorta and its branches. No abdominal, pelvic, or inguinal lymphadenopathy. Reproductive: T-shaped intrauterine vice within the lower uterine segment. Redemonstration of a stable peripherally calcified left adnexal region mass that may represent a fundal fibroid versus ovarian lesion. Otherwise uterus and bilateral adnexa are unremarkable. Other: No intraperitoneal free fluid. No intraperitoneal free gas. No organized fluid collection. Musculoskeletal: Diffuse left labial subcutaneus soft tissue edema and emphysema. No organized fluid collection. Finding extends to the inferior portion of the mons pubis and superior most medial left thigh. Overlying dermal thickening. No suspicious lytic or blastic osseous lesions. No acute displaced fracture.  Multilevel degenerative changes of the spine. IMPRESSION: 1. Diffuse left labial subcutaneus soft tissue edema and emphysema. No organized fluid collection. Finding extends to the inferior portion of the mons pubis and superior most medial left thigh. Overlying dermal thickening. 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 Correlate clinically as this is a clinical diagnosis. 2. Partially visualized bilateral lower lobe cluster of pulmonary nodules/patchy airspace opacities. Findings suggestive of infection/inflammation. Correlate with chest x-ray PA and lateral view for further evaluation. Other imaging findings of potential clinical significance: 1. Cirrhosis with portal hypertension. Innumerable underlying masses suggestive of hepatocellular carcinoma. No focal liver lesions identified. Please note that liver protocol enhanced MR and CT are the most sensitive tests for the screening detection of hepatocellular carcinoma in the high risk setting of cirrhosis. 2. 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 3. Nonobstructive right nephrolithiasis. Electronically Signed   By: Morgane  Naveau M.D.   On: 03/13/2024 18:33    Assessment/Plan 43 y/o F w/ a hx of DM and obesity who presents  w/ concern for nec fasc of the left labia and perineum  - Will proceed urgently to the OR. We discussed the alternatives and potential risks of surgery, including but not limited to: bleeding, persistent infection, need for extensive debridement, need for ICU care and/or prolonged intubation, and need for additional procedures. All questions were addressed and consent was obtained. - Plan for admission to medicine for ongoing management as well as workup of her incidentally discovered hepatic masses    [name] Surgery 03/13/2024, 8:42 PM Please see Amion for pager number during day hours 7:00am-4:30pm or 7:00am -11:30am on weekends
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
## 2024-03-13 NOTE — ED Triage Notes (Signed)
 Pt reports abscess on buttock that has enlarged to upper thigh, first noticed on Monday, saw PCP on Thurs and started on ABT, did not drain the site at the PCP

## 2024-03-13 NOTE — Progress Notes (Addendum)
 ED Pharmacy Antibiotic Sign Off An antibiotic consult was received from an ED provider for vancomycin  per pharmacy dosing for wound infection. A chart review was completed to assess appropriateness.   Now consult to add cefepime  for sepsis  The following one time order(s) were placed:  Vancomycin  2g IV x 1 Cefepime  2g IV x 1  Further antibiotic and/or antibiotic pharmacy consults should be ordered by the admitting provider if indicated.   Thank you for allowing pharmacy to be a part of this patient's care.   Dorn Poot, [facility]  Clinical Pharmacist 03/13/24 4:25 PM

## 2024-03-13 NOTE — Transfer of Care (Signed)
 Immediate Anesthesia Transfer of Care Note  Patient: [name]  Procedure(s) Performed: ERASMO UNDER ANESTHESIA, RECTUM IRRIGATION AND DEBRIDEMENT WOUND  Patient Location: PACU  Anesthesia Type:General  Level of Consciousness: awake, alert , and oriented  Airway & Oxygen Therapy: Patient connected to nasal cannula oxygen  Post-op Assessment: Report given to RN and Post -op Vital signs reviewed and stable  Post vital signs: Reviewed and stable  Last Vitals:  Vitals Value Taken Time  BP 79/61 03/13/24 23:45  Temp 102.0   Pulse 127 03/13/24 23:50  Resp 36 03/13/24 23:50  SpO2 98 % 03/13/24 23:50  Vitals shown include unfiled device data.  Last Pain:  Vitals:   03/13/24 2015  TempSrc: Oral  PainSc:          Complications: No notable events documented.

## 2024-03-13 NOTE — Progress Notes (Signed)
 Pharmacy Antibiotic Note  [name] is a 43 y.o. female admitted on 03/13/2024 with labial swelling, fever, chills, concern for necrotizing fasciitis. Pharmacy has been consulted for meropenem  dosing. PCP visit, and started keflex  500mg  QID on 8/8 wbc 21.6K, LA 5.2 > 4.3, scr 1.1 Plan: Meropenem  1g IV Q 8 (3 hr infusion) Linezolid  600 mg Q 12 F/u surgery F/u cultures  Height: 5' 5 (165.1 cm) Weight: 135.6 kg (298 lb 15.1 oz) IBW/kg (Calculated) : 57  Temp (24hrs), Avg:99.1 F (37.3 C), Min:97.7 F (36.5 C), Max:100.4 F (38 C)  Recent Labs  Lab 03/10/24 1458 03/13/24 1633 03/13/24 1634 03/13/24 1835  WBC  --  21.6*  --   --   CREATININE 1.14* 1.01*  --   --   LATICACIDVEN  --   --  5.2* 4.3*    Estimated Creatinine Clearance: 101.3 mL/min (A) (by C-G formula based on SCr of 1.01 mg/dL (H)).    Allergies  Allergen Reactions   Other Anaphylaxis and Other (See Comments)    Pt has fish allergy Pt has allergy to butter cookies   Penicillins Rash and Other (See Comments)    Told MD that PCN reaction is rash and that she has taken amoxicillin  before   Bactrim  [Sulfamethoxazole -Trimethoprim ]     Diarrhea, abdominal cramps and rash   Fish Allergy     Other reaction(s): Unknown    Antimicrobials this admission: Meropenem  1g Q8 (3 hr infusion) 8/10 >> Linezolid  600mg  Q 12 8/10 >>   Clindamycin  600mg  IV x 1 8/10 Cefepime  2g IV x 1 8/10 Vancomycin  2g IV x 1 8/10  Dose adjustments this admission:   Microbiology results: 8/10 Blood cx -   Thank you for allowing pharmacy to be a part of this patient's care.  Fernande Edison, PharmD, BCPS, BCPPS Clinical Pharmacist    03/13/2024 9:41 PM

## 2024-03-13 NOTE — MAU Note (Signed)
 CRITICAL VALUE STICKER  CRITICAL VALUE: lactic acid 4.1  RECEIVER (on-site recipient of call): CHRISTELLA Epley, RN  DATE & TIME NOTIFIED: 03/13/2024 2220  MESSENGER (representative from lab): Arlester  MD NOTIFIED: Fredirick, MD  TIME OF NOTIFICATION: 03/13/2024 2221  RESPONSE: no new orders

## 2024-03-14 ENCOUNTER — Encounter (HOSPITAL_COMMUNITY): Payer: Self-pay | Admitting: Internal Medicine

## 2024-03-14 ENCOUNTER — Other Ambulatory Visit: Payer: Self-pay

## 2024-03-14 ENCOUNTER — Inpatient Hospital Stay (HOSPITAL_COMMUNITY)

## 2024-03-14 DIAGNOSIS — M726 Necrotizing fasciitis: Secondary | ICD-10-CM | POA: Diagnosis not present

## 2024-03-14 DIAGNOSIS — R809 Proteinuria, unspecified: Secondary | ICD-10-CM

## 2024-03-14 DIAGNOSIS — R6520 Severe sepsis without septic shock: Secondary | ICD-10-CM | POA: Diagnosis present

## 2024-03-14 DIAGNOSIS — E1129 Type 2 diabetes mellitus with other diabetic kidney complication: Secondary | ICD-10-CM

## 2024-03-14 DIAGNOSIS — A419 Sepsis, unspecified organism: Secondary | ICD-10-CM | POA: Diagnosis not present

## 2024-03-14 DIAGNOSIS — R160 Hepatomegaly, not elsewhere classified: Secondary | ICD-10-CM | POA: Diagnosis present

## 2024-03-14 DIAGNOSIS — N7682 Fournier disease of vagina and vulva: Secondary | ICD-10-CM

## 2024-03-14 DIAGNOSIS — Z6841 Body Mass Index (BMI) 40.0 and over, adult: Secondary | ICD-10-CM

## 2024-03-14 DIAGNOSIS — E119 Type 2 diabetes mellitus without complications: Secondary | ICD-10-CM | POA: Diagnosis not present

## 2024-03-14 DIAGNOSIS — J4520 Mild intermittent asthma, uncomplicated: Secondary | ICD-10-CM | POA: Diagnosis present

## 2024-03-14 LAB — COMPREHENSIVE METABOLIC PANEL WITH GFR
ALT: 20 U/L (ref 0–44)
AST: 21 U/L (ref 15–41)
Albumin: 1.9 g/dL — ABNORMAL LOW (ref 3.5–5.0)
Alkaline Phosphatase: 202 U/L — ABNORMAL HIGH (ref 38–126)
Anion gap: 12 (ref 5–15)
BUN: 23 mg/dL — ABNORMAL HIGH (ref 6–20)
CO2: 19 mmol/L — ABNORMAL LOW (ref 22–32)
Calcium: 10.6 mg/dL — ABNORMAL HIGH (ref 8.9–10.3)
Chloride: 102 mmol/L (ref 98–111)
Creatinine, Ser: 1.13 mg/dL — ABNORMAL HIGH (ref 0.44–1.00)
GFR, Estimated: >60 mL/min (ref >60)
Glucose, Bld: 171 mg/dL — ABNORMAL HIGH (ref 70–99)
Potassium: 4.2 mmol/L (ref 3.5–5.1)
Sodium: 133 mmol/L — ABNORMAL LOW (ref 135–145)
Total Bilirubin: 1.3 mg/dL — ABNORMAL HIGH (ref 0.0–1.2)
Total Protein: 6.1 g/dL — ABNORMAL LOW (ref 6.5–8.1)

## 2024-03-14 LAB — URINALYSIS, COMPLETE (UACMP) WITH MICROSCOPIC
Bilirubin Urine: NEGATIVE
Glucose, UA: >=500 mg/dL — AB
Ketones, ur: NEGATIVE mg/dL
Nitrite: NEGATIVE
Protein, ur: 100 mg/dL — AB
RBC / HPF: >50 RBC/hpf (ref 0–5)
Specific Gravity, Urine: 1.025 (ref 1.005–1.030)
WBC, UA: >50 WBC/hpf (ref 0–5)
pH: 5 (ref 5.0–8.0)

## 2024-03-14 LAB — CBC WITH DIFFERENTIAL/PLATELET
Basophils Absolute: 0 K/uL (ref 0.0–0.1)
Basophils Relative: 0 %
Eosinophils Absolute: 0 K/uL (ref 0.0–0.5)
Eosinophils Relative: 0 %
HCT: 31.1 % — ABNORMAL LOW (ref 36.0–46.0)
Hemoglobin: 9.8 g/dL — ABNORMAL LOW (ref 12.0–15.0)
Lymphocytes Relative: 4 %
Lymphs Abs: 1.1 K/uL (ref 0.7–4.0)
MCH: 26.3 pg (ref 26.0–34.0)
MCHC: 31.5 g/dL (ref 30.0–36.0)
MCV: 83.4 fL (ref 80.0–100.0)
Monocytes Absolute: 1.1 K/uL — ABNORMAL HIGH (ref 0.1–1.0)
Monocytes Relative: 4 %
Neutro Abs: 26.3 K/uL — ABNORMAL HIGH (ref 1.7–7.7)
Neutrophils Relative %: 92 %
Platelets: 416 K/uL — ABNORMAL HIGH (ref 150–400)
RBC: 3.73 MIL/uL — ABNORMAL LOW (ref 3.87–5.11)
RDW: 16.9 % — ABNORMAL HIGH (ref 11.5–15.5)
WBC: 28.6 K/uL — ABNORMAL HIGH (ref 4.0–10.5)
nRBC: 0.1 % (ref 0.0–0.2)

## 2024-03-14 LAB — GLUCOSE, CAPILLARY
Glucose-Capillary: 161 mg/dL — ABNORMAL HIGH (ref 70–99)
Glucose-Capillary: 143 mg/dL — ABNORMAL HIGH (ref 70–99)
Glucose-Capillary: 221 mg/dL — ABNORMAL HIGH (ref 70–99)
Glucose-Capillary: 236 mg/dL — ABNORMAL HIGH (ref 70–99)
Glucose-Capillary: 210 mg/dL — ABNORMAL HIGH (ref 70–99)
Glucose-Capillary: 282 mg/dL — ABNORMAL HIGH (ref 70–99)

## 2024-03-14 LAB — LACTIC ACID, PLASMA
Lactic Acid, Venous: 4.6 mmol/L (ref 0.5–1.9)
Lactic Acid, Venous: 3.8 mmol/L (ref 0.5–1.9)
Lactic Acid, Venous: 3.1 mmol/L (ref 0.5–1.9)

## 2024-03-14 LAB — PHOSPHORUS: Phosphorus: 4.5 mg/dL (ref 2.5–4.6)

## 2024-03-14 LAB — MAGNESIUM: Magnesium: 1 mg/dL — ABNORMAL LOW (ref 1.7–2.4)

## 2024-03-14 LAB — PROCALCITONIN: Procalcitonin: 0.89 ng/mL

## 2024-03-14 MED ORDER — OXYCODONE HCL 5 MG PO TABS: 5.0000 mg | ORAL_TABLET | Freq: Four times a day (QID) | ORAL | Status: DC | PRN

## 2024-03-14 MED ORDER — LORATADINE 10 MG PO TABS
10.0000 mg | ORAL_TABLET | Freq: Every day | ORAL | Status: DC
  Administered 2024-03-14 – 2024-03-23 (×11): 10 mg via ORAL
  Filled 2024-03-14 (×9): qty 1

## 2024-03-14 MED ORDER — MAGNESIUM SULFATE 4 GM/100ML IV SOLN
4.0000 g | Freq: Once | INTRAVENOUS | Status: AC
  Administered 2024-03-14 (×2): 4 g via INTRAVENOUS
  Filled 2024-03-14: qty 100

## 2024-03-14 MED ORDER — ACETAMINOPHEN 500 MG PO TABS
1000.0000 mg | ORAL_TABLET | Freq: Four times a day (QID) | ORAL | Status: DC
  Administered 2024-03-14 – 2024-03-17 (×17): 1000 mg via ORAL
  Filled 2024-03-14 (×10): qty 2

## 2024-03-14 MED ORDER — ALBUMIN HUMAN 5 % IV SOLN
INTRAVENOUS | Status: AC
Start: 2024-03-14 — End: 2024-03-14
  Filled 2024-03-14: qty 250

## 2024-03-14 MED ORDER — OXYCODONE HCL 5 MG PO TABS
5.0000 mg | ORAL_TABLET | ORAL | Status: DC | PRN
  Administered 2024-03-14 (×4): 5 mg via ORAL
  Administered 2024-03-15 – 2024-03-17 (×7): 10 mg via ORAL
  Filled 2024-03-14: qty 1
  Filled 2024-03-14 (×2): qty 2
  Filled 2024-03-14: qty 1
  Filled 2024-03-14 (×2): qty 2

## 2024-03-14 MED ORDER — ALBUTEROL SULFATE (2.5 MG/3ML) 0.083% IN NEBU: 2.5000 mg | INHALATION_SOLUTION | RESPIRATORY_TRACT | Status: DC | PRN

## 2024-03-14 MED ORDER — FLUTICASONE PROPIONATE 50 MCG/ACT NA SUSP
1.0000 | Freq: Every day | NASAL | Status: DC
  Administered 2024-03-14 (×2): 2 via NASAL
  Administered 2024-03-15 (×2): 1 via NASAL
  Administered 2024-03-17 – 2024-03-20 (×3): 2 via NASAL
  Administered 2024-03-21 – 2024-03-22 (×2): 1 via NASAL
  Administered 2024-03-23: 2 via NASAL
  Filled 2024-03-14 (×2): qty 16

## 2024-03-14 MED ORDER — ACETAMINOPHEN 10 MG/ML IV SOLN
INTRAVENOUS | Status: AC
Start: 2024-03-14 — End: 2024-03-14
  Filled 2024-03-14: qty 100

## 2024-03-14 MED ORDER — HYDROMORPHONE HCL 1 MG/ML IJ SOLN
1.0000 mg | INTRAMUSCULAR | Status: DC | PRN
  Administered 2024-03-14 – 2024-03-16 (×22): 1 mg via INTRAVENOUS
  Filled 2024-03-14 (×11): qty 1

## 2024-03-14 MED ORDER — HYDROMORPHONE HCL 1 MG/ML IJ SOLN
1.0000 mg | INTRAMUSCULAR | Status: DC | PRN
  Administered 2024-03-14 (×4): 1 mg via INTRAVENOUS
  Filled 2024-03-14 (×2): qty 1

## 2024-03-14 MED ORDER — INSULIN ASPART 100 UNIT/ML IJ SOLN
0.0000 [IU] | Freq: Three times a day (TID) | INTRAMUSCULAR | Status: DC
  Administered 2024-03-15: 5 [IU] via SUBCUTANEOUS
  Administered 2024-03-15: 3 [IU] via SUBCUTANEOUS
  Administered 2024-03-15: 5 [IU] via SUBCUTANEOUS
  Administered 2024-03-15: 8 [IU] via SUBCUTANEOUS
  Administered 2024-03-15: 3 [IU] via SUBCUTANEOUS
  Administered 2024-03-15 – 2024-03-16 (×2): 8 [IU] via SUBCUTANEOUS
  Administered 2024-03-16 (×2): 2 [IU] via SUBCUTANEOUS
  Administered 2024-03-16: 8 [IU] via SUBCUTANEOUS
  Administered 2024-03-17: 3 [IU] via SUBCUTANEOUS
  Administered 2024-03-17 (×2): 2 [IU] via SUBCUTANEOUS
  Administered 2024-03-18: 5 [IU] via SUBCUTANEOUS
  Administered 2024-03-18: 2 [IU] via SUBCUTANEOUS
  Administered 2024-03-19 (×2): 5 [IU] via SUBCUTANEOUS
  Administered 2024-03-20 (×2): 11 [IU] via SUBCUTANEOUS
  Administered 2024-03-20: 5 [IU] via SUBCUTANEOUS

## 2024-03-14 MED ORDER — ALBUMIN HUMAN 5 % IV SOLN
12.5000 g | Freq: Once | INTRAVENOUS | Status: AC
  Administered 2024-03-14 (×2): 12.5 g via INTRAVENOUS

## 2024-03-14 MED ORDER — INSULIN GLARGINE-YFGN 100 UNIT/ML ~~LOC~~ SOLN
25.0000 [IU] | Freq: Every day | SUBCUTANEOUS | Status: DC
  Administered 2024-03-15 (×2): 25 [IU] via SUBCUTANEOUS
  Filled 2024-03-14: qty 0.25

## 2024-03-14 MED ORDER — LACTATED RINGERS IV BOLUS
500.0000 mL | Freq: Once | INTRAVENOUS | Status: AC
  Administered 2024-03-14 (×2): 500 mL via INTRAVENOUS

## 2024-03-14 MED ORDER — INSULIN ASPART 100 UNIT/ML IJ SOLN
0.0000 [IU] | Freq: Every day | INTRAMUSCULAR | Status: DC
  Administered 2024-03-14 (×2): 3 [IU] via SUBCUTANEOUS
  Administered 2024-03-17: 2 [IU] via SUBCUTANEOUS
  Administered 2024-03-18: 3 [IU] via SUBCUTANEOUS
  Administered 2024-03-19: 2 [IU] via SUBCUTANEOUS

## 2024-03-14 MED ORDER — INSULIN GLARGINE-YFGN 100 UNIT/ML ~~LOC~~ SOLN
10.0000 [IU] | Freq: Every day | SUBCUTANEOUS | Status: DC
  Administered 2024-03-14 (×2): 10 [IU] via SUBCUTANEOUS
  Filled 2024-03-14: qty 0.1

## 2024-03-14 MED ORDER — ACETAMINOPHEN 10 MG/ML IV SOLN
1000.0000 mg | Freq: Once | INTRAVENOUS | Status: AC
  Administered 2024-03-14 (×2): 1000 mg via INTRAVENOUS
  Filled 2024-03-14: qty 100

## 2024-03-14 MED ORDER — METHOCARBAMOL 500 MG PO TABS
500.0000 mg | ORAL_TABLET | Freq: Three times a day (TID) | ORAL | Status: DC | PRN
  Administered 2024-03-15 – 2024-03-23 (×15): 500 mg via ORAL
  Filled 2024-03-14 (×12): qty 1

## 2024-03-14 NOTE — Op Note (Signed)
 03/13/2024  12:16 AM  PATIENT:  [name] Avers  43 y.o. female  Patient Care Team: Georgina Speaks, FNP as PCP - General (General Practice) Arnell Rockney PARAS, [facility] (Inactive) (Pharmacist) Shamleffer, Donell Cardinal, MD as Attending Physician (Endocrinology) Zina Jerilynn LABOR, MD as Consulting Physician (Obstetrics and Gynecology)  PRE-OPERATIVE DIAGNOSIS:  Necrotizing infection of the perineum  POST-OPERATIVE DIAGNOSIS:  Same  PROCEDURE:  Excision of infected tissue (15cm x 6cm x 5cm)  SURGEON:  Cordella RONAL Idler, MD  ASSISTANT: None  ANESTHESIA:   general  COUNTS:  Sponge, needle and instrument counts were reported correct x2 at the conclusion of the operation.  EBL:  DRAINS: None  SPECIMEN:  Fluid for culture Tissue for culture  COMPLICATIONS: None  FINDINGS: Left labial abscess with underlying necrosis   DISPOSITION: PACU in satisfactory condition  INDICATION: [name] is a 43 year old female with history of diabetes who presented to the ED with 6 days of left labial swelling, fevers, and general malaise.  CT imaging was performed and concerning for a necrotizing infection of the left labia and perineum.  Given these findings I recommended that we proceed to the operating room for exploration and debridement of all involved tissue.  All of her questions were addressed and written consent was obtained.  DESCRIPTION: The patient was identified in preop holding and taken to the OR where she was placed on the operating room table. SCDs were placed. General endotracheal anesthesia was induced without difficulty. The patient was placed in lithotomy and the labia and perineum was then prepped and draped in the usual sterile fashion. A surgical timeout was performed indicating the correct patient, procedure, positioning and need for preoperative antibiotics.   I began by making an incision within the left labia.  This was carried down through the subcutaneous tissues using  electrocautery.  There is immediate return of purulent and murky fluid from within a large cavity.  Fluid was sent for culture.  I probed the cavity and found that it tracked several centimeters cephalad and caudad.  An elliptical incision was made to open the underlying cavity.  At the base of the wound was a moderate amount of necrotic appearing tissue.  Using scissors, the wound base was debrided of necrotic tissue until there was a healthy appearing base with bleeding tissue and muscle.  A sample of necrotic tissue was sent for tissue culture.  The wound was measured at 15 cm x 6 cm x 5 cm.  The wound was debrided further with chlorhexidine  scrub.  I inspected for hemostasis with bleeding controlled using electrocautery.  The wound was irrigated.  The wound was then packed with moist Kerlix and covered with an ABD.  She tolerated the procedure well and was brought to PACU in stable condition by anesthesia.

## 2024-03-14 NOTE — Progress Notes (Signed)
 Progress Note  1 Day Post-Op  Subjective: Pt reports pain in left groin and also from left plantar fasciitis. Denies abdominal pain. Having some nausea but feels like it is because she is hungry this AM.   Objective: Vital signs in last 24 hours: Temp:  [97.7 F (36.5 C)-102 F (38.9 C)] 98 F (36.7 C) (08/11 0745) Pulse Rate:  [92-127] 96 (08/11 0745) Resp:  [13-33] 17 (08/11 0745) BP: (79-143)/(55-84) 102/71 (08/11 0745) SpO2:  [95 %-100 %] 100 % (08/11 0745) FiO2 (%):  [28 %] 28 % (08/10 2205) Weight:  [135.6 kg-137.2 kg] 137.2 kg (08/11 0131)    Intake/Output from previous day: 08/10 0701 - 08/11 0700 In: 4015.9 [I.V.:1179; IV Piggyback:2836.8] Out: 400 [Urine:380; Blood:20] Intake/Output this shift: Total I/O In: -  Out: 250 [Urine:250]  PE: General: pleasant, WD, obese female who is laying in bed in NAD Heart: regular, rate, and rhythm. Palpable pedal pulses bilaterally Lungs: Respiratory effort nonlabored Abd: soft, NT, ND GU: packing left in place today, mons with some erythema and mild induration, no significant cellulitis extending down left inner thigh Psych: A&Ox3 with an appropriate affect.    Lab Results:  Recent Labs    03/13/24 2139 03/14/24 0340  WBC 25.1* 28.6*  HGB 10.8* 9.8*  HCT 34.0* 31.1*  PLT 394 416*   BMET Recent Labs    03/13/24 2139 03/14/24 0340  NA 131* 133*  K 3.8 4.2  CL 96* 102  CO2 21* 19*  GLUCOSE 179* 171*  BUN 22* 23*  CREATININE 0.86 1.13*  CALCIUM  11.0* 10.6*   PT/INR Recent Labs    03/13/24 2139  LABPROT 16.4*  INR 1.3*   CMP     Component Value Date/Time   NA 133 (L) 03/14/2024 0340   NA 136 03/10/2024 1458   K 4.2 03/14/2024 0340   CL 102 03/14/2024 0340   CO2 19 (L) 03/14/2024 0340   GLUCOSE 171 (H) 03/14/2024 0340   BUN 23 (H) 03/14/2024 0340   BUN 33 (H) 03/10/2024 1458   CREATININE 1.13 (H) 03/14/2024 0340   CALCIUM  10.6 (H) 03/14/2024 0340   PROT 6.1 (L) 03/14/2024 0340   PROT 7.5  11/02/2023 0933   ALBUMIN  1.9 (L) 03/14/2024 0340   ALBUMIN  3.8 (L) 11/02/2023 0933   AST 21 03/14/2024 0340   ALT 20 03/14/2024 0340   ALKPHOS 202 (H) 03/14/2024 0340   BILITOT 1.3 (H) 03/14/2024 0340   BILITOT 0.5 11/02/2023 0933   GFRNONAA >60 03/14/2024 0340   GFRAA 108 08/28/2020 1735   Lipase     Component Value Date/Time   LIPASE 46 05/15/2021 1058       Studies/Results: CT ABDOMEN PELVIS W CONTRAST Result Date: 03/13/2024 CLINICAL DATA:  Abdominal pain, acute, nonlocalized Large labial abscess Abscess on buttock, enlarged to upper thigh, first noticed 6 days ago. Hx fibroid, UTI, morbid obesity, cesarean section, and tubal ligation. EXAM: CT ABDOMEN AND PELVIS WITH CONTRAST TECHNIQUE: Multidetector CT imaging of the abdomen and pelvis was performed using the standard protocol following bolus administration of intravenous contrast. RADIATION DOSE REDUCTION: This exam was performed according to the departmental dose-optimization program which includes automated exposure control, adjustment of the mA and/or kV according to patient size and/or use of iterative reconstruction technique. CONTRAST:  OMNIPAQUE  IOHEXOL  300 MG/ML  SOLN COMPARISON:  CT renal 03/04/2022 FINDINGS: Lower chest: Partially visualized bilateral lower lobe cluster of pulmonary nodules/patchy airspace opacities. Hepatobiliary: Nodular hepatic contour with innumerable subcentimeter and pericentimeter hypodense  lesions. No focal liver abnormality. No gallstones, gallbladder wall thickening, or pericholecystic fluid. No biliary dilatation. Pancreas: No focal lesion. Normal pancreatic contour. No surrounding inflammatory changes. No main pancreatic ductal dilatation. Spleen: The spleen is enlarged in caliber measuring up to 15 cm. No focal lesion. Adrenals/Urinary Tract: No adrenal nodule bilaterally. Bilateral kidneys enhance symmetrically. No hydronephrosis. No hydroureter. Right nephrolithiasis measuring up to 3 mm.  No left nephrolithiasis. No ureterolithiasis bilaterally. The urinary bladder is unremarkable. Stomach/Bowel: Stomach is within normal limits. No evidence of bowel wall thickening or dilatation. Appendix appears normal. Vascular/Lymphatic: No abdominal aorta or iliac aneurysm. Mild atherosclerotic plaque of the aorta and its branches. No abdominal, pelvic, or inguinal lymphadenopathy. Reproductive: T-shaped intrauterine vice within the lower uterine segment. Redemonstration of a stable peripherally calcified left adnexal region mass that may represent a fundal fibroid versus ovarian lesion. Otherwise uterus and bilateral adnexa are unremarkable. Other: No intraperitoneal free fluid. No intraperitoneal free gas. No organized fluid collection. Musculoskeletal: Diffuse left labial subcutaneus soft tissue edema and emphysema. No organized fluid collection. Finding extends to the inferior portion of the mons pubis and superior most medial left thigh. Overlying dermal thickening. No suspicious lytic or blastic osseous lesions. No acute displaced fracture. Multilevel degenerative changes of the spine. IMPRESSION: 1. Diffuse left labial subcutaneus soft tissue edema and emphysema. No organized fluid collection. Finding extends to the inferior portion of the mons pubis and superior most medial left thigh. Overlying dermal thickening. 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 Correlate clinically as this is a clinical diagnosis. 2. Partially visualized bilateral lower lobe cluster of pulmonary nodules/patchy airspace opacities. Findings suggestive of infection/inflammation. Correlate with chest x-ray PA and lateral view for further evaluation. Other imaging findings of potential clinical significance: 1. Cirrhosis with portal hypertension. Innumerable underlying masses suggestive of hepatocellular carcinoma. No focal liver lesions identified. Please note that liver protocol enhanced MR and CT are the most sensitive tests for the  screening detection of hepatocellular carcinoma in the high risk setting of cirrhosis. 2. 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 3. Nonobstructive right nephrolithiasis. Electronically Signed   By: Morgane  Naveau M.D.   On: 03/13/2024 18:33    Anti-infectives: Anti-infectives (From admission, onward)    Start     Dose/Rate Route Frequency Ordered Stop   03/14/24 1000  meropenem  (MERREM ) 1 g in sodium chloride  0.9 % 100 mL IVPB        1 g 33.3 mL/hr over 180 Minutes Intravenous Every 8 hours 03/13/24 2140     03/13/24 2200  linezolid  (ZYVOX ) IVPB 600 mg        600 mg 300 mL/hr over 60 Minutes Intravenous Every 12 hours 03/13/24 2055 03/23/24 2159   03/13/24 2130  meropenem  (MERREM ) 1 g in sodium chloride  0.9 % 100 mL IVPB        1 g 33.3 mL/hr over 180 Minutes Intravenous  Once 03/13/24 2055 03/14/24 0455   03/13/24 1900  clindamycin  (CLEOCIN ) IVPB 600 mg        600 mg 100 mL/hr over 30 Minutes Intravenous  Once 03/13/24 1859 03/13/24 1938   03/13/24 1730  vancomycin  (VANCOCIN ) IVPB 1000 mg/200 mL premix       Placed in Followed by Linked Group   1,000 mg 200 mL/hr over 60 Minutes Intravenous  Once 03/13/24 1625 03/13/24 2010   03/13/24 1730  ceFEPIme  (MAXIPIME ) 2 g in sodium chloride  0.9 % 100 mL IVPB        2 g 200 mL/hr over  30 Minutes Intravenous  Once 03/13/24 1728 03/13/24 1835   03/13/24 1630  vancomycin  (VANCOCIN ) IVPB 1000 mg/200 mL premix       Placed in Followed by Linked Group   1,000 mg 200 mL/hr over 60 Minutes Intravenous  Once 03/13/24 1625 03/13/24 1751        Assessment/Plan NSTI of left labia/perineum - s/p I&D around [place] last night by Dr. Polly - ok to have a diet today  - leave packing place today and will plan to change at bedside tomorrow AM, will make NPO after MN in case patient would need to return to OR. Will coordinate with RN to ensure pain medication given prior to dressing change - continue BS IV abx, GS with abundant  G- rods and moderate G+ cocci - WBC 28.6 from 25.1, afebrile this AM and hemodynamics improving - ok to change outer dressings as needed for saturation or if soiled   FEN: CM diet, IVF per [facility] VTE: ok to have LMWH or SQH from surgery standpoint ID: meropenem  and linezolid   - per [facility] -  Morbid obesity - BMI 50.33 T2DM - A1c on 8/7 8.8 HTN Mild intermittent asthma  LOS: 1 day    Burnard JONELLE Louder, [facility] 03/14/2024, 9:41 AM Please see Amion for pager number during day hours 7:00am-4:30pm

## 2024-03-14 NOTE — Progress Notes (Signed)
   03/14/24 0131  Assess: MEWS Score  Temp 100.2 F (37.9 C)  BP 106/67  MAP (mmHg) 80  Pulse Rate (!) 106  ECG Heart Rate (!) 107  Resp (!) 24  Level of Consciousness Alert  SpO2 98 %  O2 Device Nasal Cannula  O2 Flow Rate (L/min) 2 L/min  Assess: MEWS Score  MEWS Temp 0  MEWS Systolic 0  MEWS Pulse 1  MEWS RR 1  MEWS LOC 0  MEWS Score 2  MEWS Score Color Yellow  Assess: if the MEWS score is Yellow or Red  Were vital signs accurate and taken at a resting state? Yes  Does the patient meet 2 or more of the SIRS criteria? Yes  Does the patient have a confirmed or suspected source of infection? No  MEWS guidelines implemented  Yes, yellow  Treat  MEWS Interventions Considered administering scheduled or prn medications/treatments as ordered  Take Vital Signs  Increase Vital Sign Frequency  Yellow: Q2hr x1, continue Q4hrs until patient remains green for 12hrs  Escalate  MEWS: Escalate Yellow: Discuss with charge nurse and consider notifying provider and/or RRT  Notify: Charge Nurse/RN  Name of Charge Nurse/RN Notified Fred, RN  Provider Notification  Provider Name/Title Dr. Marcene  Date Provider Notified 03/14/24  Time Provider Notified [phone]  Method of Notification Page (secure chat)  Notification Reason Critical Result;Change in status (lactic acid 4.6; yellow MEWS)  Provider response See new orders  Date of Provider Response 03/14/24  Time of Provider Response 0330  Assess: SIRS CRITERIA  SIRS Temperature  0  SIRS Respirations  1  SIRS Pulse 1  SIRS WBC 1  SIRS Score Sum  3

## 2024-03-14 NOTE — Anesthesia Postprocedure Evaluation (Signed)
 Anesthesia Post Note  Patient: [name]  Procedure(s) Performed: ERASMO UNDER ANESTHESIA, IRRIGATION AND DEBRIDEMENT WOUND     Patient location during evaluation: PACU Anesthesia Type: General Level of consciousness: awake and alert Pain management: pain level controlled Vital Signs Assessment: post-procedure vital signs reviewed and stable Respiratory status: spontaneous breathing, nonlabored ventilation and respiratory function stable Cardiovascular status: stable, blood pressure returned to baseline and tachycardic Anesthetic complications: no   No notable events documented.  Last Vitals:  Vitals:   03/14/24 0015 03/14/24 0030  BP: 101/60 101/67  Pulse: (!) 122 (!) 118  Resp: (!) 31 (!) 24  Temp:    SpO2: 98% 99%    Last Pain:  Vitals:   03/14/24 0030  TempSrc:   PainSc: 4                  Debby FORBES Like

## 2024-03-14 NOTE — Progress Notes (Signed)
 [facility]                                                                               [name], is a 43 y.o. female, DOB - March 29, 1981, FMW:[id number] Admit date - 03/13/2024    Outpatient Primary MD for the patient is Georgina Speaks, FNP  LOS - 1  days    Brief summary   [name] is a 43 y.o. female with medical history significant for type 2 diabetes mellitus, mild intermittent asthma, essential hypertension, who is admitted to [facility] on 03/13/2024 by way of transfer from [facility] with severe sepsis due to necrotizing fasciitis after presenting from home to the latter facility complaining of left labial pain.   Assessment & Plan    Assessment and Plan:  Severe sepsis secondary to necrotizing fascitis of the left labia:  S/p excision of the infected tissue Continue with IV antibiotics and IV fluids.  Trend lactate.  Pain control.  Follow up blood cultures.      Hypercalcemia of unclear etiology.  Hydrate and repeat calcium  level in am.   Mulitple liver masses.  Will obtain MRI of the liver in the next 24 to 48 hours.    Type 2 DM CBG (last 3)  Recent Labs    03/14/24 0747 03/14/24 1144 03/14/24 1543  GLUCAP 143* 221* 236*   Resume SSI.    Hyponatremia    Mild AKI Hydrate and repeat renal paramers.    Estimated body mass index is 50.33 kg/m as calculated from the following:   Height as of this encounter: 5' 5 (1.651 m).   Weight as of this encounter: 137.2 kg.  Code Status: full code DVT Prophylaxis:  SCDs Start: 03/13/24 2205   Level of Care: Level of care: Progressive Family Communication: Updated patient's family at bedside.   Disposition Plan:     Remains inpatient appropriate:  pending clinical improvement.   Procedures:   Excision of infected tissue (15cm x 6cm x 5cm)   Consultants:   Gen surgery.   Antimicrobials:   Anti-infectives (From admission, onward)    Start     Dose/Rate  Route Frequency Ordered Stop   03/14/24 1000  meropenem  (MERREM ) 1 g in sodium chloride  0.9 % 100 mL IVPB        1 g 33.3 mL/hr over 180 Minutes Intravenous Every 8 hours 03/13/24 2140     03/13/24 2200  linezolid  (ZYVOX ) IVPB 600 mg        600 mg 300 mL/hr over 60 Minutes Intravenous Every 12 hours 03/13/24 2055 03/23/24 2159   03/13/24 2130  meropenem  (MERREM ) 1 g in sodium chloride  0.9 % 100 mL IVPB        1 g 33.3 mL/hr over 180 Minutes Intravenous  Once 03/13/24 2055 03/14/24 0455   03/13/24 1900  clindamycin  (CLEOCIN ) IVPB 600 mg        600 mg 100 mL/hr over 30 Minutes Intravenous  Once 03/13/24 1859 03/14/24 1608   03/13/24 1730  vancomycin  (VANCOCIN ) IVPB 1000 mg/200 mL premix       Placed in Followed by Linked Group   1,000  mg 200 mL/hr over 60 Minutes Intravenous  Once 03/13/24 1625 03/13/24 2010   03/13/24 1730  ceFEPIme  (MAXIPIME ) 2 g in sodium chloride  0.9 % 100 mL IVPB        2 g 200 mL/hr over 30 Minutes Intravenous  Once 03/13/24 1728 03/13/24 1835   03/13/24 1630  vancomycin  (VANCOCIN ) IVPB 1000 mg/200 mL premix       Placed in Followed by Linked Group   1,000 mg 200 mL/hr over 60 Minutes Intravenous  Once 03/13/24 1625 03/13/24 1751        Medications  Scheduled Meds:  acetaminophen   1,000 mg Oral Q6H   fluticasone   1-2 spray Each Nare Daily   insulin  aspart  0-9 Units Subcutaneous Q4H   insulin  glargine-yfgn  10 Units Subcutaneous Daily   loratadine   10 mg Oral Daily   Continuous Infusions:  linezolid  (ZYVOX ) IV 600 mg (03/14/24 1055)   meropenem  (MERREM ) 1 g in sodium chloride  0.9 % 100 mL IVPB 1 g (03/14/24 1818)   PRN Meds:.albuterol , HYDROmorphone  (DILAUDID ) injection, methocarbamol , naLOXone  (NARCAN )  injection, ondansetron  (ZOFRAN ) IV, oxyCODONE     Subjective:   [name] was seen and examined today.  Pain not well controlled. No nausea or vomiting.   Objective:   Vitals:   03/14/24 0600 03/14/24 0745 03/14/24 1044 03/14/24 1541   BP: 103/74 102/71 116/70 115/74  Pulse: 93 96 (!) 110 100  Resp: 20 17 20  (!) 24  Temp: 98.3 F (36.8 C) 98 F (36.7 C) 99.6 F (37.6 C) 98.1 F (36.7 C)  TempSrc: Oral Oral Oral Oral  SpO2: 99% 100% 95% 96%  Weight:      Height:        Intake/Output Summary (Last 24 hours) at 03/14/2024 1852 Last data filed at 03/14/2024 1749 Gross per 24 hour  Intake 3815.87 ml  Output 1850 ml  Net 1965.87 ml   Filed Weights   03/13/24 1559 03/14/24 0131  Weight: 135.6 kg (!) 137.2 kg     Exam General exam: Appears calm and comfortable  Respiratory system: Clear to auscultation. Respiratory effort normal. Cardiovascular system: S1 & S2 heard, RRR.  Gastrointestinal system: Abdomen is nondistended, soft and nontender. Central nervous system: Alert and oriented. No focal neurological deficits. Extremities: Symmetric 5 x 5 power. Skin:  dressing over the vaginal area. Psychiatry:  Mood & affect appropriate.     Data Reviewed:  I have personally reviewed following labs and imaging studies   CBC Lab Results  Component Value Date   WBC 28.6 (H) 03/14/2024   RBC 3.73 (L) 03/14/2024   HGB 9.8 (L) 03/14/2024   HCT 31.1 (L) 03/14/2024   MCV 83.4 03/14/2024   MCH 26.3 03/14/2024   PLT 416 (H) 03/14/2024   MCHC 31.5 03/14/2024   RDW 16.9 (H) 03/14/2024   LYMPHSABS 1.1 03/14/2024   MONOABS 1.1 (H) 03/14/2024   EOSABS 0.0 03/14/2024   BASOSABS 0.0 03/14/2024     Last metabolic panel Lab Results  Component Value Date   NA 133 (L) 03/14/2024   K 4.2 03/14/2024   CL 102 03/14/2024   CO2 19 (L) 03/14/2024   BUN 23 (H) 03/14/2024   CREATININE 1.13 (H) 03/14/2024   GLUCOSE 171 (H) 03/14/2024   GFRNONAA >60 03/14/2024   GFRAA 108 08/28/2020   CALCIUM  10.6 (H) 03/14/2024   PHOS 4.5 03/14/2024   PROT 6.1 (L) 03/14/2024   ALBUMIN  1.9 (L) 03/14/2024   LABGLOB 3.7 11/02/2023   AGRATIO 1.6 08/21/2021  BILITOT 1.3 (H) 03/14/2024   ALKPHOS 202 (H) 03/14/2024   AST 21 03/14/2024    ALT 20 03/14/2024   ANIONGAP 12 03/14/2024    CBG (last 3)  Recent Labs    03/14/24 0747 03/14/24 1144 03/14/24 1543  GLUCAP 143* 221* 236*      Coagulation Profile: Recent Labs  Lab 03/13/24 2139  INR 1.3*     Radiology Studies: [facility] Chest Port 1 View Result Date: 03/14/2024 EXAM: 1 VIEW XRAY OF THE CHEST 03/14/2024 05:51:25 AM COMPARISON: PA and lateral radiographs of the chest dated 08/25/2021. CLINICAL HISTORY: Sepsis (Bilateral lower lobe airspace opacities seen on CT abd/pelvis, with incomplete visualization of the lungs) FINDINGS: LUNGS AND PLEURA: No focal pulmonary opacity. No pulmonary edema. No pleural effusion. No pneumothorax. HEART AND MEDIASTINUM: No acute abnormality of the cardiac and mediastinal silhouettes. BONES AND SOFT TISSUES: No acute osseous abnormality. IMPRESSION: 1. No acute findings. Electronically signed by: evalene coho 03/14/2024 09:46 AM EDT RP Workstation: [id number]   CT ABDOMEN PELVIS W CONTRAST Result Date: 03/13/2024 CLINICAL DATA:  Abdominal pain, acute, nonlocalized Large labial abscess Abscess on buttock, enlarged to upper thigh, first noticed 6 days ago. Hx fibroid, UTI, morbid obesity, cesarean section, and tubal ligation. EXAM: CT ABDOMEN AND PELVIS WITH CONTRAST TECHNIQUE: Multidetector CT imaging of the abdomen and pelvis was performed using the standard protocol following bolus administration of intravenous contrast. RADIATION DOSE REDUCTION: This exam was performed according to the departmental dose-optimization program which includes automated exposure control, adjustment of the mA and/or kV according to patient size and/or use of iterative reconstruction technique. CONTRAST:  OMNIPAQUE  IOHEXOL  300 MG/ML  SOLN COMPARISON:  CT renal 03/04/2022 FINDINGS: Lower chest: Partially visualized bilateral lower lobe cluster of pulmonary nodules/patchy airspace opacities. Hepatobiliary: Nodular hepatic contour with innumerable subcentimeter  and pericentimeter hypodense lesions. No focal liver abnormality. No gallstones, gallbladder wall thickening, or pericholecystic fluid. No biliary dilatation. Pancreas: No focal lesion. Normal pancreatic contour. No surrounding inflammatory changes. No main pancreatic ductal dilatation. Spleen: The spleen is enlarged in caliber measuring up to 15 cm. No focal lesion. Adrenals/Urinary Tract: No adrenal nodule bilaterally. Bilateral kidneys enhance symmetrically. No hydronephrosis. No hydroureter. Right nephrolithiasis measuring up to 3 mm. No left nephrolithiasis. No ureterolithiasis bilaterally. The urinary bladder is unremarkable. Stomach/Bowel: Stomach is within normal limits. No evidence of bowel wall thickening or dilatation. Appendix appears normal. Vascular/Lymphatic: No abdominal aorta or iliac aneurysm. Mild atherosclerotic plaque of the aorta and its branches. No abdominal, pelvic, or inguinal lymphadenopathy. Reproductive: T-shaped intrauterine vice within the lower uterine segment. Redemonstration of a stable peripherally calcified left adnexal region mass that may represent a fundal fibroid versus ovarian lesion. Otherwise uterus and bilateral adnexa are unremarkable. Other: No intraperitoneal free fluid. No intraperitoneal free gas. No organized fluid collection. Musculoskeletal: Diffuse left labial subcutaneus soft tissue edema and emphysema. No organized fluid collection. Finding extends to the inferior portion of the mons pubis and superior most medial left thigh. Overlying dermal thickening. No suspicious lytic or blastic osseous lesions. No acute displaced fracture. Multilevel degenerative changes of the spine. IMPRESSION: 1. Diffuse left labial subcutaneus soft tissue edema and emphysema. No organized fluid collection. Finding extends to the inferior portion of the mons pubis and superior most medial left thigh. Overlying dermal thickening. 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 Correlate clinically  as this is a clinical diagnosis. 2. Partially visualized bilateral lower lobe cluster of pulmonary nodules/patchy airspace opacities. Findings suggestive of infection/inflammation. Correlate with chest x-ray PA and lateral view  for further evaluation. Other imaging findings of potential clinical significance: 1. Cirrhosis with portal hypertension. Innumerable underlying masses suggestive of hepatocellular carcinoma. No focal liver lesions identified. Please note that liver protocol enhanced MR and CT are the most sensitive tests for the screening detection of hepatocellular carcinoma in the high risk setting of cirrhosis. 2. 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 3. Nonobstructive right nephrolithiasis. Electronically Signed   By: Morgane  Naveau M.D.   On: 03/13/2024 18:33       Elgie Butter M.D. [facility] 03/14/2024, 6:52 PM  Available via Epic secure chat 7am-7pm After 7 pm, please refer to night coverage provider listed on amion.

## 2024-03-14 NOTE — Progress Notes (Signed)
 During handoff from Zebedee, CRNA pt was found to have oral temp 102 and BP 79/61. Zebedee, CRNA attempted to call Dr. Lucious, but he did not answer. After about 15 minutes I called him and he answered, but was in an emergency case and said he would call me back. Zebedee, CRNA returned to PACU and said Dr. Lucious gave verbal order for an albumin  and that she would put an order in for Ofirmev  since it was not given intra-op. I administered the Ofirmev  and albumin  as ordered. Patient's BP and temp improved during PACU stay, with an oral temp of 101.3 and BP 111/66 with MAP of 80. Gave report to Wanda, RN on 3E who then was at bedside when I transported the patient. Patient was on phone with sister, both of whom were in good spirits that her vitals were improving.

## 2024-03-14 NOTE — Plan of Care (Signed)
  Problem: Clinical Measurements: Goal: Will remain free from infection Outcome: Progressing Goal: Respiratory complications will improve Outcome: Progressing Goal: Cardiovascular complication will be avoided Outcome: Progressing   Problem: Elimination: Goal: Will not experience complications related to urinary retention Outcome: Progressing   Problem: Safety: Goal: Ability to remain free from injury will improve Outcome: Progressing   

## 2024-03-15 ENCOUNTER — Inpatient Hospital Stay (HOSPITAL_COMMUNITY)

## 2024-03-15 DIAGNOSIS — E119 Type 2 diabetes mellitus without complications: Secondary | ICD-10-CM | POA: Diagnosis not present

## 2024-03-15 DIAGNOSIS — M726 Necrotizing fasciitis: Secondary | ICD-10-CM | POA: Diagnosis not present

## 2024-03-15 DIAGNOSIS — A419 Sepsis, unspecified organism: Secondary | ICD-10-CM | POA: Diagnosis not present

## 2024-03-15 LAB — MAGNESIUM: Magnesium: 1.5 mg/dL — ABNORMAL LOW (ref 1.7–2.4)

## 2024-03-15 LAB — CBC WITH DIFFERENTIAL/PLATELET
Basophils Absolute: 0 K/uL (ref 0.0–0.1)
Basophils Relative: 0 %
Eosinophils Absolute: 0 K/uL (ref 0.0–0.5)
Eosinophils Relative: 0 %
HCT: 29.3 % — ABNORMAL LOW (ref 36.0–46.0)
Hemoglobin: 9.3 g/dL — ABNORMAL LOW (ref 12.0–15.0)
Lymphocytes Relative: 2 %
Lymphs Abs: 0.5 K/uL — ABNORMAL LOW (ref 0.7–4.0)
MCH: 26.3 pg (ref 26.0–34.0)
MCHC: 31.7 g/dL (ref 30.0–36.0)
MCV: 83 fL (ref 80.0–100.0)
Monocytes Absolute: 0.9 K/uL (ref 0.1–1.0)
Monocytes Relative: 4 %
Neutro Abs: 21.9 K/uL — ABNORMAL HIGH (ref 1.7–7.7)
Neutrophils Relative %: 94 %
Platelets: 432 K/uL — ABNORMAL HIGH (ref 150–400)
RBC: 3.53 MIL/uL — ABNORMAL LOW (ref 3.87–5.11)
RDW: 16.9 % — ABNORMAL HIGH (ref 11.5–15.5)
WBC: 23.3 K/uL — ABNORMAL HIGH (ref 4.0–10.5)
nRBC: 0 % (ref 0.0–0.2)

## 2024-03-15 LAB — BASIC METABOLIC PANEL WITH GFR
Anion gap: 8 (ref 5–15)
BUN: 18 mg/dL (ref 6–20)
CO2: 24 mmol/L (ref 22–32)
Calcium: 10.6 mg/dL — ABNORMAL HIGH (ref 8.9–10.3)
Chloride: 101 mmol/L (ref 98–111)
Creatinine, Ser: 0.79 mg/dL (ref 0.44–1.00)
GFR, Estimated: >60 mL/min (ref >60)
Glucose, Bld: 262 mg/dL — ABNORMAL HIGH (ref 70–99)
Potassium: 3.9 mmol/L (ref 3.5–5.1)
Sodium: 133 mmol/L — ABNORMAL LOW (ref 135–145)

## 2024-03-15 LAB — AFP TUMOR MARKER: AFP, Serum, Tumor Marker: <1.8 ng/mL (ref 0.0–6.4)

## 2024-03-15 LAB — PARATHYROID HORMONE, INTACT (NO CA): PTH: <6 pg/mL — ABNORMAL LOW (ref 15–65)

## 2024-03-15 LAB — LACTIC ACID, PLASMA: Lactic Acid, Venous: 2.7 mmol/L (ref 0.5–1.9)

## 2024-03-15 LAB — GLUCOSE, CAPILLARY
Glucose-Capillary: 201 mg/dL — ABNORMAL HIGH (ref 70–99)
Glucose-Capillary: 157 mg/dL — ABNORMAL HIGH (ref 70–99)
Glucose-Capillary: 252 mg/dL — ABNORMAL HIGH (ref 70–99)
Glucose-Capillary: 165 mg/dL — ABNORMAL HIGH (ref 70–99)

## 2024-03-15 LAB — PHOSPHORUS: Phosphorus: 2.7 mg/dL (ref 2.5–4.6)

## 2024-03-15 MED ORDER — SODIUM CHLORIDE 0.9 % IV SOLN: INTRAVENOUS | Status: DC

## 2024-03-15 MED ORDER — GADOBUTROL 1 MMOL/ML IV SOLN
10.0000 mL | Freq: Once | INTRAVENOUS | Status: AC | PRN
  Administered 2024-03-15 (×2): 10 mL via INTRAVENOUS

## 2024-03-15 MED ORDER — MAGNESIUM SULFATE 2 GM/50ML IV SOLN
2.0000 g | Freq: Once | INTRAVENOUS | Status: AC
  Administered 2024-03-15 (×2): 2 g via INTRAVENOUS
  Filled 2024-03-15: qty 50

## 2024-03-15 MED ORDER — INSULIN GLARGINE-YFGN 100 UNIT/ML ~~LOC~~ SOLN
30.0000 [IU] | Freq: Every day | SUBCUTANEOUS | Status: DC
  Administered 2024-03-16 – 2024-03-19 (×5): 30 [IU] via SUBCUTANEOUS
  Filled 2024-03-15 (×4): qty 0.3

## 2024-03-15 NOTE — Progress Notes (Signed)
 Progress Note  2 Days Post-Op  Subjective: Pt with increased pain in left inner thigh this AM. Dressing was changed yesterday during the day due to getting soiled with using bedpan. Patient tolerated dressing change at bedside with pre-medication well this AM.   Objective: Vital signs in last 24 hours: Temp:  [97.1 F (36.2 C)-99.6 F (37.6 C)] 97.1 F (36.2 C) (08/12 0700) Pulse Rate:  [100-110] 106 (08/12 0400) Resp:  [20-32] 32 (08/12 0400) BP: (115-129)/(68-81) 124/68 (08/12 0700) SpO2:  [95 %-98 %] 98 % (08/12 0400) Weight:  [140.1 kg] 140.1 kg (08/12 0500)    Intake/Output from previous day: 08/11 0701 - 08/12 0700 In: 240 [P.O.:240] Out: 3050 [Urine:3050] Intake/Output this shift: No intake/output data recorded.  PE: General: pleasant, WD, obese female who is laying in bed in NAD Heart: regular, rate, and rhythm. Palpable pedal pulses bilaterally Lungs: Respiratory effort nonlabored Abd: soft, NT, ND GU: wound with some necrotic appearing tissue in base and small amount purulent drainage more superiorly, mild surrounding cellulitis of mons and left inner thigh   Psych: A&Ox3 with an appropriate affect.    Lab Results:  Recent Labs    03/14/24 0340 03/15/24 0248  WBC 28.6* 23.3*  HGB 9.8* 9.3*  HCT 31.1* 29.3*  PLT 416* 432*   BMET Recent Labs    03/14/24 0340 03/15/24 0248  NA 133* 133*  K 4.2 3.9  CL 102 101  CO2 19* 24  GLUCOSE 171* 262*  BUN 23* 18  CREATININE 1.13* 0.79  CALCIUM  10.6* 10.6*   PT/INR Recent Labs    03/13/24 2139  LABPROT 16.4*  INR 1.3*   CMP     Component Value Date/Time   NA 133 (L) 03/15/2024 0248   NA 136 03/10/2024 1458   K 3.9 03/15/2024 0248   CL 101 03/15/2024 0248   CO2 24 03/15/2024 0248   GLUCOSE 262 (H) 03/15/2024 0248   BUN 18 03/15/2024 0248   BUN 33 (H) 03/10/2024 1458   CREATININE 0.79 03/15/2024 0248   CALCIUM  10.6 (H) 03/15/2024 0248   PROT 6.1 (L) 03/14/2024 0340   PROT 7.5 11/02/2023  0933   ALBUMIN  1.9 (L) 03/14/2024 0340   ALBUMIN  3.8 (L) 11/02/2023 0933   AST 21 03/14/2024 0340   ALT 20 03/14/2024 0340   ALKPHOS 202 (H) 03/14/2024 0340   BILITOT 1.3 (H) 03/14/2024 0340   BILITOT 0.5 11/02/2023 0933   GFRNONAA >60 03/15/2024 0248   GFRAA 108 08/28/2020 1735   Lipase     Component Value Date/Time   LIPASE 46 05/15/2021 1058       Studies/Results: DG Chest Port 1 View Result Date: 03/14/2024 EXAM: 1 VIEW XRAY OF THE CHEST 03/14/2024 05:51:25 AM COMPARISON: PA and lateral radiographs of the chest dated 08/25/2021. CLINICAL HISTORY: Sepsis (Bilateral lower lobe airspace opacities seen on CT abd/pelvis, with incomplete visualization of the lungs) FINDINGS: LUNGS AND PLEURA: No focal pulmonary opacity. No pulmonary edema. No pleural effusion. No pneumothorax. HEART AND MEDIASTINUM: No acute abnormality of the cardiac and mediastinal silhouettes. BONES AND SOFT TISSUES: No acute osseous abnormality. IMPRESSION: 1. No acute findings. Electronically signed by: evalene coho 03/14/2024 09:46 AM EDT RP Workstation: [id number]   CT ABDOMEN PELVIS W CONTRAST Result Date: 03/13/2024 CLINICAL DATA:  Abdominal pain, acute, nonlocalized Large labial abscess Abscess on buttock, enlarged to upper thigh, first noticed 6 days ago. Hx fibroid, UTI, morbid obesity, cesarean section, and tubal ligation. EXAM: CT ABDOMEN AND PELVIS WITH CONTRAST  TECHNIQUE: Multidetector CT imaging of the abdomen and pelvis was performed using the standard protocol following bolus administration of intravenous contrast. RADIATION DOSE REDUCTION: This exam was performed according to the departmental dose-optimization program which includes automated exposure control, adjustment of the mA and/or kV according to patient size and/or use of iterative reconstruction technique. CONTRAST:  OMNIPAQUE  IOHEXOL  300 MG/ML  SOLN COMPARISON:  CT renal 03/04/2022 FINDINGS: Lower chest: Partially visualized bilateral  lower lobe cluster of pulmonary nodules/patchy airspace opacities. Hepatobiliary: Nodular hepatic contour with innumerable subcentimeter and pericentimeter hypodense lesions. No focal liver abnormality. No gallstones, gallbladder wall thickening, or pericholecystic fluid. No biliary dilatation. Pancreas: No focal lesion. Normal pancreatic contour. No surrounding inflammatory changes. No main pancreatic ductal dilatation. Spleen: The spleen is enlarged in caliber measuring up to 15 cm. No focal lesion. Adrenals/Urinary Tract: No adrenal nodule bilaterally. Bilateral kidneys enhance symmetrically. No hydronephrosis. No hydroureter. Right nephrolithiasis measuring up to 3 mm. No left nephrolithiasis. No ureterolithiasis bilaterally. The urinary bladder is unremarkable. Stomach/Bowel: Stomach is within normal limits. No evidence of bowel wall thickening or dilatation. Appendix appears normal. Vascular/Lymphatic: No abdominal aorta or iliac aneurysm. Mild atherosclerotic plaque of the aorta and its branches. No abdominal, pelvic, or inguinal lymphadenopathy. Reproductive: T-shaped intrauterine vice within the lower uterine segment. Redemonstration of a stable peripherally calcified left adnexal region mass that may represent a fundal fibroid versus ovarian lesion. Otherwise uterus and bilateral adnexa are unremarkable. Other: No intraperitoneal free fluid. No intraperitoneal free gas. No organized fluid collection. Musculoskeletal: Diffuse left labial subcutaneus soft tissue edema and emphysema. No organized fluid collection. Finding extends to the inferior portion of the mons pubis and superior most medial left thigh. Overlying dermal thickening. No suspicious lytic or blastic osseous lesions. No acute displaced fracture. Multilevel degenerative changes of the spine. IMPRESSION: 1. Diffuse left labial subcutaneus soft tissue edema and emphysema. No organized fluid collection. Finding extends to the inferior portion of  the mons pubis and superior most medial left thigh. Overlying dermal thickening. 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 Correlate clinically as this is a clinical diagnosis. 2. Partially visualized bilateral lower lobe cluster of pulmonary nodules/patchy airspace opacities. Findings suggestive of infection/inflammation. Correlate with chest x-ray PA and lateral view for further evaluation. Other imaging findings of potential clinical significance: 1. Cirrhosis with portal hypertension. Innumerable underlying masses suggestive of hepatocellular carcinoma. No focal liver lesions identified. Please note that liver protocol enhanced MR and CT are the most sensitive tests for the screening detection of hepatocellular carcinoma in the high risk setting of cirrhosis. 2. 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 3. Nonobstructive right nephrolithiasis. Electronically Signed   By: Morgane  Naveau M.D.   On: 03/13/2024 18:33    Anti-infectives: Anti-infectives (From admission, onward)    Start     Dose/Rate Route Frequency Ordered Stop   03/14/24 1000  meropenem  (MERREM ) 1 g in sodium chloride  0.9 % 100 mL IVPB        1 g 33.3 mL/hr over 180 Minutes Intravenous Every 8 hours 03/13/24 2140 03/24/24 0959   03/13/24 2200  linezolid  (ZYVOX ) IVPB 600 mg        600 mg 300 mL/hr over 60 Minutes Intravenous Every 12 hours 03/13/24 2055 03/23/24 2159   03/13/24 2130  meropenem  (MERREM ) 1 g in sodium chloride  0.9 % 100 mL IVPB        1 g 33.3 mL/hr over 180 Minutes Intravenous  Once 03/13/24 2055 03/14/24 0455   03/13/24 1900  clindamycin  (CLEOCIN )  IVPB 600 mg        600 mg 100 mL/hr over 30 Minutes Intravenous  Once 03/13/24 1859 03/14/24 1608   03/13/24 1730  vancomycin  (VANCOCIN ) IVPB 1000 mg/200 mL premix       Placed in Followed by Linked Group   1,000 mg 200 mL/hr over 60 Minutes Intravenous  Once 03/13/24 1625 03/13/24 2010   03/13/24 1730  ceFEPIme  (MAXIPIME ) 2 g in sodium chloride  0.9 %  100 mL IVPB        2 g 200 mL/hr over 30 Minutes Intravenous  Once 03/13/24 1728 03/13/24 1835   03/13/24 1630  vancomycin  (VANCOCIN ) IVPB 1000 mg/200 mL premix       Placed in Followed by Linked Group   1,000 mg 200 mL/hr over 60 Minutes Intravenous  Once 03/13/24 1625 03/13/24 1751        Assessment/Plan NSTI of left labia/perineum - s/p I&D 8/10 last night by Dr. Polly - ok to have a diet today  - change dressing PRN for soiling today, ok to shower with this - would recommend debridement in OR tomorrow with concern for more necrotic looking tissue in base, the healthy tissue present looks good though  - continue BS IV abx, GS with abundant G- rods and moderate G+ cocci - WBC 23K, HD stable - NPO after MN  FEN: CM diet, IVF per [facility] VTE: ok to have LMWH or SQH from surgery standpoint ID: meropenem  and linezolid   - per [facility] -  Morbid obesity - BMI 50.33 T2DM - A1c on 8/7 8.8 HTN Mild intermittent asthma  LOS: 2 days    Burnard JONELLE Louder, [facility] 03/15/2024, 9:49 AM Please see Amion for pager number during day hours 7:00am-4:30pm

## 2024-03-15 NOTE — Progress Notes (Signed)
 [facility]                                                                               [name], is a 43 y.o. female, DOB - [birth year], FMW:[id number] Admit date - 03/13/2024    Outpatient Primary MD for the patient is Stephanie Speaks, FNP  LOS - 2  days    Brief summary   [name] is a 42 y.o. female with medical history significant for type 2 diabetes mellitus, mild intermittent asthma, essential hypertension, who is admitted to [facility] on 03/13/2024 by way of transfer from [facility] with severe sepsis due to necrotizing fasciitis after presenting from home to the latter facility complaining of left labial pain.   Assessment & Plan    Assessment and Plan:  Severe sepsis secondary to necrotizing fascitis of the left labia:  S/p excision of the infected tissue, on Exam patient has some necrotic tissue . Plan for OR in am.  Cultures from the wound growing Abundant gram negative rods and gram positive cocci. Continue with IV Meropenam and linezolid  and IV fluids.  Trend lactate. Lactic acid is 2.7. , improving Leukocytosis improving slowly.  Follow up blood cultures. So far they are negative.  Pain control.      Hypercalcemia of unclear etiology.  Hydrate , PTH intact, repeat labs in am.   Mulitple liver masses.   MRI of the liver.    Type 2 DM with hyperglycemia. CBG (last 3)  Recent Labs    03/14/24 2136 03/15/24 0614 03/15/24 1159  GLUCAP 282* 201* 157*   Resume SSI. Hemoglobin A1c is 8.8.  Increase Semglee  30 units daily.    Hyponatremia Improved to 133.    Hypomagnesemia Replaced, repeat in am.    Mild AKI Hydrate and repeat renal paramers.   Anemia of acute illness/ thrombocytosis Transfuse to keep hemoglobin to keep it greater than 7.    Estimated body mass index is 51.4 kg/m as calculated from the following:   Height as of this encounter: 5' 5 (1.651 m).   Weight as of this encounter: 140.1  kg.  Code Status: full code DVT Prophylaxis:  SCDs Start: 03/13/24 2205   Level of Care: Level of care: Med-Surg Family Communication: Updated patient's family at bedside.   Disposition Plan:     Remains inpatient appropriate:  pending clinical improvement.   Procedures:   Excision of infected tissue (15cm x 6cm x 5cm)   Consultants:   Gen surgery.   Antimicrobials:   Anti-infectives (From admission, onward)    Start     Dose/Rate Route Frequency Ordered Stop   03/14/24 1000  meropenem  (MERREM ) 1 g in sodium chloride  0.9 % 100 mL IVPB        1 g 33.3 mL/hr over 180 Minutes Intravenous Every 8 hours 03/13/24 2140 03/24/24 0959   03/13/24 2200  linezolid  (ZYVOX ) IVPB 600 mg        600 mg 300 mL/hr over 60 Minutes Intravenous Every 12 hours 03/13/24 2055 03/23/24 2159   03/13/24 2130  meropenem  (MERREM ) 1 g in sodium chloride  0.9 % 100 mL IVPB  1 g 33.3 mL/hr over 180 Minutes Intravenous  Once 03/13/24 2055 03/14/24 0455   03/13/24 1900  clindamycin  (CLEOCIN ) IVPB 600 mg        600 mg 100 mL/hr over 30 Minutes Intravenous  Once 03/13/24 1859 03/14/24 1608   03/13/24 1730  vancomycin  (VANCOCIN ) IVPB 1000 mg/200 mL premix       Placed in Followed by Linked Group   1,000 mg 200 mL/hr over 60 Minutes Intravenous  Once 03/13/24 1625 03/13/24 2010   03/13/24 1730  ceFEPIme  (MAXIPIME ) 2 g in sodium chloride  0.9 % 100 mL IVPB        2 g 200 mL/hr over 30 Minutes Intravenous  Once 03/13/24 1728 03/13/24 1835   03/13/24 1630  vancomycin  (VANCOCIN ) IVPB 1000 mg/200 mL premix       Placed in Followed by Linked Group   1,000 mg 200 mL/hr over 60 Minutes Intravenous  Once 03/13/24 1625 03/13/24 1751        Medications  Scheduled Meds:  acetaminophen   1,000 mg Oral Q6H   fluticasone   1-2 spray Each Nare Daily   insulin  aspart  0-15 Units Subcutaneous TID WC   insulin  aspart  0-5 Units Subcutaneous QHS   insulin  glargine-yfgn  25 Units Subcutaneous Daily   loratadine    10 mg Oral Daily   Continuous Infusions:  linezolid  (ZYVOX ) IV 600 mg (03/15/24 1209)   meropenem  (MERREM ) 1 g in sodium chloride  0.9 % 100 mL IVPB 1 g (03/15/24 0953)   PRN Meds:.albuterol , HYDROmorphone  (DILAUDID ) injection, methocarbamol , naLOXone  (NARCAN )  injection, ondansetron  (ZOFRAN ) IV, oxyCODONE     Subjective:   [name] was seen and examined today.  No new complaints.   Objective:   Vitals:   03/15/24 0400 03/15/24 0500 03/15/24 0700 03/15/24 1100  BP: 129/81  124/68   Pulse: (!) 106     Resp: (!) 32     Temp:  99.5 F (37.5 C) (!) 97.1 F (36.2 C) 97.7 F (36.5 C)  TempSrc: Oral Oral Axillary Axillary  SpO2: 98%     Weight:  (!) 140.1 kg    Height:        Intake/Output Summary (Last 24 hours) at 03/15/2024 1556 Last data filed at 03/15/2024 0300 Gross per 24 hour  Intake 240 ml  Output 2000 ml  Net -1760 ml   Filed Weights   03/13/24 1559 03/14/24 0131 03/15/24 0500  Weight: 135.6 kg (!) 137.2 kg (!) 140.1 kg     Exam General exam: Appears calm and comfortable  Respiratory system: Clear to auscultation. Respiratory effort normal. Cardiovascular system: S1 & S2 heard, RRR. Gastrointestinal system: Abdomen is nondistended, soft and nontender.  Central nervous system: Alert and oriented. No focal neurological deficits. Extremities: Symmetric 5 x 5 power. Skin: No rashes,  Psychiatry:  Mood & affect appropriate.     Data Reviewed:  I have personally reviewed following labs and imaging studies   CBC Lab Results  Component Value Date   WBC 23.3 (H) 03/15/2024   RBC 3.53 (L) 03/15/2024   HGB 9.3 (L) 03/15/2024   HCT 29.3 (L) 03/15/2024   MCV 83.0 03/15/2024   MCH 26.3 03/15/2024   PLT 432 (H) 03/15/2024   MCHC 31.7 03/15/2024   RDW 16.9 (H) 03/15/2024   LYMPHSABS 0.5 (L) 03/15/2024   MONOABS 0.9 03/15/2024   EOSABS 0.0 03/15/2024   BASOSABS 0.0 03/15/2024     Last metabolic panel Lab Results  Component Value Date   NA 133 (L)  03/15/2024   K 3.9 03/15/2024   CL 101 03/15/2024   CO2 24 03/15/2024   BUN 18 03/15/2024   CREATININE 0.79 03/15/2024   GLUCOSE 262 (H) 03/15/2024   GFRNONAA >60 03/15/2024   GFRAA 108 08/28/2020   CALCIUM  10.6 (H) 03/15/2024   PHOS 2.7 03/15/2024   PROT 6.1 (L) 03/14/2024   ALBUMIN  1.9 (L) 03/14/2024   LABGLOB 3.7 11/02/2023   AGRATIO 1.6 08/21/2021   BILITOT 1.3 (H) 03/14/2024   ALKPHOS 202 (H) 03/14/2024   AST 21 03/14/2024   ALT 20 03/14/2024   ANIONGAP 8 03/15/2024    CBG (last 3)  Recent Labs    03/14/24 2136 03/15/24 0614 03/15/24 1159  GLUCAP 282* 201* 157*      Coagulation Profile: Recent Labs  Lab 03/13/24 2139  INR 1.3*     Radiology Studies: DG Chest Port 1 View Result Date: 03/14/2024 EXAM: 1 VIEW XRAY OF THE CHEST 03/14/2024 05:51:25 AM COMPARISON: PA and lateral radiographs of the chest dated 08/25/2021. CLINICAL HISTORY: Sepsis (Bilateral lower lobe airspace opacities seen on CT abd/pelvis, with incomplete visualization of the lungs) FINDINGS: LUNGS AND PLEURA: No focal pulmonary opacity. No pulmonary edema. No pleural effusion. No pneumothorax. HEART AND MEDIASTINUM: No acute abnormality of the cardiac and mediastinal silhouettes. BONES AND SOFT TISSUES: No acute osseous abnormality. IMPRESSION: 1. No acute findings. Electronically signed by: evalene coho 03/14/2024 09:46 AM EDT RP Workstation: [id number]   CT ABDOMEN PELVIS W CONTRAST Result Date: 03/13/2024 CLINICAL DATA:  Abdominal pain, acute, nonlocalized Large labial abscess Abscess on buttock, enlarged to upper thigh, first noticed 6 days ago. Hx fibroid, UTI, morbid obesity, cesarean section, and tubal ligation. EXAM: CT ABDOMEN AND PELVIS WITH CONTRAST TECHNIQUE: Multidetector CT imaging of the abdomen and pelvis was performed using the standard protocol following bolus administration of intravenous contrast. RADIATION DOSE REDUCTION: This exam was performed according to the departmental  dose-optimization program which includes automated exposure control, adjustment of the mA and/or kV according to patient size and/or use of iterative reconstruction technique. CONTRAST:  OMNIPAQUE  IOHEXOL  300 MG/ML  SOLN COMPARISON:  CT renal 03/04/2022 FINDINGS: Lower chest: Partially visualized bilateral lower lobe cluster of pulmonary nodules/patchy airspace opacities. Hepatobiliary: Nodular hepatic contour with innumerable subcentimeter and pericentimeter hypodense lesions. No focal liver abnormality. No gallstones, gallbladder wall thickening, or pericholecystic fluid. No biliary dilatation. Pancreas: No focal lesion. Normal pancreatic contour. No surrounding inflammatory changes. No main pancreatic ductal dilatation. Spleen: The spleen is enlarged in caliber measuring up to 15 cm. No focal lesion. Adrenals/Urinary Tract: No adrenal nodule bilaterally. Bilateral kidneys enhance symmetrically. No hydronephrosis. No hydroureter. Right nephrolithiasis measuring up to 3 mm. No left nephrolithiasis. No ureterolithiasis bilaterally. The urinary bladder is unremarkable. Stomach/Bowel: Stomach is within normal limits. No evidence of bowel wall thickening or dilatation. Appendix appears normal. Vascular/Lymphatic: No abdominal aorta or iliac aneurysm. Mild atherosclerotic plaque of the aorta and its branches. No abdominal, pelvic, or inguinal lymphadenopathy. Reproductive: T-shaped intrauterine vice within the lower uterine segment. Redemonstration of a stable peripherally calcified left adnexal region mass that may represent a fundal fibroid versus ovarian lesion. Otherwise uterus and bilateral adnexa are unremarkable. Other: No intraperitoneal free fluid. No intraperitoneal free gas. No organized fluid collection. Musculoskeletal: Diffuse left labial subcutaneus soft tissue edema and emphysema. No organized fluid collection. Finding extends to the inferior portion of the mons pubis and superior most medial left  thigh. Overlying dermal thickening. No suspicious lytic or blastic osseous lesions. No acute displaced fracture.  Multilevel degenerative changes of the spine. IMPRESSION: 1. Diffuse left labial subcutaneus soft tissue edema and emphysema. No organized fluid collection. Finding extends to the inferior portion of the mons pubis and superior most medial left thigh. Overlying dermal thickening. 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 Correlate clinically as this is a clinical diagnosis. 2. Partially visualized bilateral lower lobe cluster of pulmonary nodules/patchy airspace opacities. Findings suggestive of infection/inflammation. Correlate with chest x-ray PA and lateral view for further evaluation. Other imaging findings of potential clinical significance: 1. Cirrhosis with portal hypertension. Innumerable underlying masses suggestive of hepatocellular carcinoma. No focal liver lesions identified. Please note that liver protocol enhanced MR and CT are the most sensitive tests for the screening detection of hepatocellular carcinoma in the high risk setting of cirrhosis. 2. 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 3. Nonobstructive right nephrolithiasis. Electronically Signed   By: Morgane  Naveau M.D.   On: 03/13/2024 18:33       Elgie Butter M.D. [facility] 03/15/2024, 3:56 PM  Available via Epic secure chat 7am-7pm After 7 pm, please refer to night coverage provider listed on amion.

## 2024-03-15 NOTE — Progress Notes (Signed)
 Per RNCM, patient needing utility resources. CSW saw patient and provided community resources that potentially can help assist. 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   Seidy Labreck, MSW, LCSWA Transitions of Care [phone]
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
## 2024-03-15 NOTE — TOC CM/SW Note (Addendum)
 Transition of Care [facility]) - Inpatient Brief Assessment   Patient Details  Name: [name] MRN: [id number] Date of Birth: 06/02/1981  Transition of [facility] CM/SW Contact:    Waddell Barnie Rama, RN Phone Number: 03/15/2024, 3:15 PM   Clinical Narrative: From home alone, has PCP  and insurance on file, states has no HH services in place at this time or DME at home.  St she has a left boot on foot for plantar fasciitis states  she may need a cab at dc  and family is support system, states gets medications from [facility] on [address].  Pta self ambulatory.   There are no IP CM  needs identified  at this time.  Please place consult for IP CM needs.     Transition of Care Asessment: Insurance and Status: Insurance coverage has been reviewed Patient has primary care physician: Yes Home environment has been reviewed: home with kids Prior level of function:: indep Prior/Current Home Services: No current home services Social Drivers of Health Review: SDOH reviewed no interventions necessary Readmission risk has been reviewed: Yes Transition of care needs: no transition of care needs at this time

## 2024-03-15 NOTE — Plan of Care (Signed)
   Problem: Education: Goal: Knowledge of General Education information will improve Description: Including pain rating scale, medication(s)/side effects and non-pharmacologic comfort measures Outcome: Progressing   Problem: Nutrition: Goal: Adequate nutrition will be maintained Outcome: Progressing   Problem: Coping: Goal: Level of anxiety will decrease Outcome: Progressing

## 2024-03-15 NOTE — Anesthesia Preprocedure Evaluation (Addendum)
 Anesthesia Evaluation  Patient identified by MRN, date of birth, ID band Patient awake    Reviewed: Allergy & Precautions, NPO status , Patient's Chart, lab work & pertinent test results  History of Anesthesia Complications Negative for: history of anesthetic complications  Airway Mallampati: III  TM Distance: >3 FB Neck ROM: Full   Comment: Previous grade I view with Miller 2 Dental  (+) Poor Dentition, Missing, Dental Advisory Given   Pulmonary neg shortness of breath, asthma , sleep apnea (does not use CPAP) , neg COPD, neg recent URI, former smoker   breath sounds clear to auscultation       Cardiovascular hypertension, Pt. on medications and Pt. on home beta blockers (-) angina (-) Past MI, (-) Cardiac Stents and (-) CABG (-) dysrhythmias  Rhythm:Regular Rate:Normal  HLD   Neuro/Psych neg Seizures PSYCHIATRIC DISORDERS Anxiety Depression    negative neurological ROS     GI/Hepatic ,GERD  Medicated,,Liver masses   Endo/Other  diabetes, Type 2, Insulin  Dependent, Oral Hypoglycemic Agents  Class 4 obesity  Renal/GU negative Renal ROS     Musculoskeletal   Abdominal  (+) + obese  Peds  Hematology  (+) Blood dyscrasia, anemia Lab Results      Component                Value               Date                      WBC                      23.3 (H)            03/15/2024                HGB                      9.3 (L)             03/15/2024                HCT                      29.3 (L)            03/15/2024                MCV                      83.0                03/15/2024                PLT                      432 (H)             03/15/2024              Anesthesia Other Findings Last Trulicity : 2 weeks ago  Reproductive/Obstetrics                              Anesthesia Physical Anesthesia Plan  ASA: 3  Anesthesia Plan: General   Post-op Pain Management:    Induction:  Intravenous  PONV Risk Score and Plan: 3 and Ondansetron , Dexamethasone , Midazolam  and Treatment may vary due  to age or medical condition  Airway Management Planned: Oral ETT  Additional Equipment:   Intra-op Plan:   Post-operative Plan: Extubation in OR  Informed Consent: I have reviewed the patients History and Physical, chart, labs and discussed the procedure including the risks, benefits and alternatives for the proposed anesthesia with the patient or authorized representative who has indicated his/her understanding and acceptance.     Dental advisory given  Plan Discussed with: CRNA and Anesthesiologist  Anesthesia Plan Comments: (Risks of general anesthesia discussed including, but not limited to, sore throat, hoarse voice, chipped/damaged teeth, injury to vocal cords, nausea and vomiting, allergic reactions, lung infection, heart attack, stroke, and death. All questions answered. )         Anesthesia Quick Evaluation

## 2024-03-15 NOTE — Discharge Instructions (Signed)

## 2024-03-16 ENCOUNTER — Inpatient Hospital Stay (HOSPITAL_COMMUNITY): Payer: Self-pay | Admitting: Certified Registered Nurse Anesthetist

## 2024-03-16 ENCOUNTER — Encounter (HOSPITAL_COMMUNITY)
Admission: AD | Disposition: A | Discharge status: Home Health | Payer: Self-pay | Source: Home / Self Care | Attending: Internal Medicine

## 2024-03-16 ENCOUNTER — Encounter (HOSPITAL_COMMUNITY): Payer: Self-pay | Admitting: Internal Medicine

## 2024-03-16 ENCOUNTER — Other Ambulatory Visit: Payer: Self-pay

## 2024-03-16 ENCOUNTER — Encounter (HOSPITAL_COMMUNITY): Payer: Self-pay | Admitting: Certified Registered Nurse Anesthetist

## 2024-03-16 ENCOUNTER — Inpatient Hospital Stay (HOSPITAL_COMMUNITY)

## 2024-03-16 DIAGNOSIS — M726 Necrotizing fasciitis: Secondary | ICD-10-CM | POA: Diagnosis not present

## 2024-03-16 HISTORY — PX: INCISION AND DRAINAGE PERIRECTAL ABSCESS: SHX1804

## 2024-03-16 LAB — BASIC METABOLIC PANEL WITH GFR
Anion gap: 9 (ref 5–15)
BUN: 15 mg/dL (ref 6–20)
CO2: 23 mmol/L (ref 22–32)
Calcium: 10.7 mg/dL — ABNORMAL HIGH (ref 8.9–10.3)
Chloride: 105 mmol/L (ref 98–111)
Creatinine, Ser: 0.72 mg/dL (ref 0.44–1.00)
GFR, Estimated: >60 mL/min (ref >60)
Glucose, Bld: 228 mg/dL — ABNORMAL HIGH (ref 70–99)
Potassium: 4 mmol/L (ref 3.5–5.1)
Sodium: 137 mmol/L (ref 135–145)

## 2024-03-16 LAB — CBC WITH DIFFERENTIAL/PLATELET
Abs Granulocyte: 13.6 K/uL — ABNORMAL HIGH (ref 1.5–6.5)
Abs Immature Granulocytes: 1.67 K/uL — ABNORMAL HIGH (ref 0.00–0.07)
Basophils Absolute: 0.1 K/uL (ref 0.0–0.1)
Basophils Relative: 0 %
Eosinophils Absolute: 0.5 K/uL (ref 0.0–0.5)
Eosinophils Relative: 3 %
HCT: 29.9 % — ABNORMAL LOW (ref 36.0–46.0)
Hemoglobin: 9.3 g/dL — ABNORMAL LOW (ref 12.0–15.0)
Immature Granulocytes: 9 %
Lymphocytes Relative: 5 %
Lymphs Abs: 1 K/uL (ref 0.7–4.0)
MCH: 26.1 pg (ref 26.0–34.0)
MCHC: 31.1 g/dL (ref 30.0–36.0)
MCV: 83.8 fL (ref 80.0–100.0)
Monocytes Absolute: 1.7 K/uL — ABNORMAL HIGH (ref 0.1–1.0)
Monocytes Relative: 9 %
Neutro Abs: 13.6 K/uL — ABNORMAL HIGH (ref 1.7–7.7)
Neutrophils Relative %: 74 %
Platelets: 469 K/uL — ABNORMAL HIGH (ref 150–400)
RBC: 3.57 MIL/uL — ABNORMAL LOW (ref 3.87–5.11)
RDW: 17.1 % — ABNORMAL HIGH (ref 11.5–15.5)
Smear Review: NORMAL
WBC: 18.5 K/uL — ABNORMAL HIGH (ref 4.0–10.5)
nRBC: 0.3 % — ABNORMAL HIGH (ref 0.0–0.2)

## 2024-03-16 LAB — GLUCOSE, CAPILLARY
Glucose-Capillary: 212 mg/dL — ABNORMAL HIGH (ref 70–99)
Glucose-Capillary: 156 mg/dL — ABNORMAL HIGH (ref 70–99)
Glucose-Capillary: 148 mg/dL — ABNORMAL HIGH (ref 70–99)
Glucose-Capillary: 148 mg/dL — ABNORMAL HIGH (ref 70–99)
Glucose-Capillary: 268 mg/dL — ABNORMAL HIGH (ref 70–99)
Glucose-Capillary: 173 mg/dL — ABNORMAL HIGH (ref 70–99)

## 2024-03-16 LAB — PTH, INTACT AND CALCIUM
Calcium, Total (PTH): 10.4 mg/dL — ABNORMAL HIGH (ref 8.7–10.2)
PTH: <6 pg/mL — ABNORMAL LOW (ref 15–65)

## 2024-03-16 LAB — MAGNESIUM: Magnesium: 1.5 mg/dL — ABNORMAL LOW (ref 1.7–2.4)

## 2024-03-16 SURGERY — INCISION AND DRAINAGE, ABSCESS, PERIRECTAL
Anesthesia: General | Site: Perineum

## 2024-03-16 MED ORDER — FENTANYL CITRATE (PF) 100 MCG/2ML IJ SOLN
INTRAMUSCULAR | Status: AC
  Filled 2024-03-16: qty 2

## 2024-03-16 MED ORDER — MIDAZOLAM HCL 2 MG/2ML IJ SOLN
INTRAMUSCULAR | Status: DC | PRN
Start: 2024-03-16 — End: 2024-03-16
  Administered 2024-03-16 (×2): 2 mg via INTRAVENOUS

## 2024-03-16 MED ORDER — PROPOFOL 10 MG/ML IV BOLUS
INTRAVENOUS | Status: AC
  Filled 2024-03-16: qty 20

## 2024-03-16 MED ORDER — ALBUTEROL SULFATE HFA 108 (90 BASE) MCG/ACT IN AERS
INHALATION_SPRAY | RESPIRATORY_TRACT | Status: DC | PRN
  Administered 2024-03-16 (×2): 6 via RESPIRATORY_TRACT

## 2024-03-16 MED ORDER — PHENYLEPHRINE 80 MCG/ML (10ML) SYRINGE FOR IV PUSH (FOR BLOOD PRESSURE SUPPORT)
PREFILLED_SYRINGE | INTRAVENOUS | Status: DC | PRN
  Administered 2024-03-16: 80 ug via INTRAVENOUS
  Administered 2024-03-16 (×2): 160 ug via INTRAVENOUS
  Administered 2024-03-16: 80 ug via INTRAVENOUS
  Administered 2024-03-16 (×2): 160 ug via INTRAVENOUS

## 2024-03-16 MED ORDER — LIDOCAINE 2% (20 MG/ML) 5 ML SYRINGE
INTRAMUSCULAR | Status: AC
  Filled 2024-03-16: qty 5

## 2024-03-16 MED ORDER — OXYCODONE HCL 5 MG PO TABS: 5.0000 mg | ORAL_TABLET | Freq: Once | ORAL | Status: DC | PRN

## 2024-03-16 MED ORDER — FENTANYL CITRATE (PF) 250 MCG/5ML IJ SOLN
INTRAMUSCULAR | Status: DC | PRN
  Administered 2024-03-16 (×2): 100 ug via INTRAVENOUS

## 2024-03-16 MED ORDER — PANTOPRAZOLE SODIUM 40 MG PO TBEC
40.0000 mg | DELAYED_RELEASE_TABLET | Freq: Every day | ORAL | Status: DC
  Administered 2024-03-16 – 2024-03-23 (×9): 40 mg via ORAL
  Filled 2024-03-16 (×8): qty 1

## 2024-03-16 MED ORDER — PROPOFOL 10 MG/ML IV BOLUS
INTRAVENOUS | Status: DC | PRN
  Administered 2024-03-16 (×2): 200 mg via INTRAVENOUS

## 2024-03-16 MED ORDER — MIDAZOLAM HCL 2 MG/2ML IJ SOLN
INTRAMUSCULAR | Status: AC
  Filled 2024-03-16: qty 2

## 2024-03-16 MED ORDER — SODIUM CHLORIDE 0.9 % IV SOLN
2.0000 g | Freq: Every day | INTRAVENOUS | Status: DC
  Administered 2024-03-16 – 2024-03-17 (×3): 2 g via INTRAVENOUS
  Filled 2024-03-16 (×2): qty 20

## 2024-03-16 MED ORDER — ORAL CARE MOUTH RINSE: 15.0000 mL | Freq: Once | OROMUCOSAL | Status: AC

## 2024-03-16 MED ORDER — CHLORHEXIDINE GLUCONATE 0.12 % MT SOLN: 15.0000 mL | Freq: Once | OROMUCOSAL | Status: AC

## 2024-03-16 MED ORDER — AMISULPRIDE (ANTIEMETIC) 5 MG/2ML IV SOLN: 10.0000 mg | Freq: Once | INTRAVENOUS | Status: DC | PRN

## 2024-03-16 MED ORDER — ACETAMINOPHEN 10 MG/ML IV SOLN
1000.0000 mg | Freq: Once | INTRAVENOUS | Status: DC | PRN
Start: 2024-03-16 — End: 2024-03-16
  Administered 2024-03-16 (×2): 1000 mg via INTRAVENOUS

## 2024-03-16 MED ORDER — INSULIN ASPART 100 UNIT/ML IJ SOLN
0.0000 [IU] | INTRAMUSCULAR | Status: DC | PRN
  Administered 2024-03-16 (×2): 2 [IU] via SUBCUTANEOUS
  Filled 2024-03-16: qty 1

## 2024-03-16 MED ORDER — FENTANYL CITRATE (PF) 250 MCG/5ML IJ SOLN
INTRAMUSCULAR | Status: AC
  Filled 2024-03-16: qty 5

## 2024-03-16 MED ORDER — VASHE WOUND IRRIGATION OPTIME
TOPICAL | Status: DC | PRN
  Administered 2024-03-16 (×2): 34 [oz_av]

## 2024-03-16 MED ORDER — ROCURONIUM BROMIDE 100 MG/10ML IV SOLN
INTRAVENOUS | Status: DC | PRN
Start: 2024-03-16 — End: 2024-03-16
  Administered 2024-03-16 (×2): 60 mg via INTRAVENOUS

## 2024-03-16 MED ORDER — ACETAMINOPHEN 10 MG/ML IV SOLN
INTRAVENOUS | Status: AC
  Filled 2024-03-16: qty 100

## 2024-03-16 MED ORDER — LACTATED RINGERS IV SOLN: INTRAVENOUS | Status: DC

## 2024-03-16 MED ORDER — KETOROLAC TROMETHAMINE 30 MG/ML IJ SOLN
INTRAMUSCULAR | Status: DC | PRN
  Administered 2024-03-16 (×2): 7.5 mg via INTRAVENOUS

## 2024-03-16 MED ORDER — FENTANYL CITRATE (PF) 100 MCG/2ML IJ SOLN
25.0000 ug | INTRAMUSCULAR | Status: DC | PRN
  Administered 2024-03-16 (×4): 25 ug via INTRAVENOUS

## 2024-03-16 MED ORDER — LIDOCAINE 2% (20 MG/ML) 5 ML SYRINGE
INTRAMUSCULAR | Status: DC | PRN
Start: 2024-03-16 — End: 2024-03-16
  Administered 2024-03-16 (×2): 100 mg via INTRAVENOUS

## 2024-03-16 MED ORDER — ROCURONIUM BROMIDE 10 MG/ML (PF) SYRINGE
PREFILLED_SYRINGE | INTRAVENOUS | Status: AC
  Filled 2024-03-16: qty 10

## 2024-03-16 MED ORDER — MAGNESIUM OXIDE -MG SUPPLEMENT 400 (240 MG) MG PO TABS
800.0000 mg | ORAL_TABLET | Freq: Two times a day (BID) | ORAL | Status: DC
  Administered 2024-03-16 – 2024-03-23 (×17): 800 mg via ORAL
  Filled 2024-03-16 (×15): qty 2

## 2024-03-16 MED ORDER — 0.9 % SODIUM CHLORIDE (POUR BTL) OPTIME
TOPICAL | Status: DC | PRN
  Administered 2024-03-16 (×2): 1000 mL

## 2024-03-16 MED ORDER — ONDANSETRON HCL 4 MG/2ML IJ SOLN
INTRAMUSCULAR | Status: AC
  Filled 2024-03-16: qty 2

## 2024-03-16 MED ORDER — SERTRALINE HCL 50 MG PO TABS
75.0000 mg | ORAL_TABLET | Freq: Every day | ORAL | Status: DC
  Administered 2024-03-16 – 2024-03-23 (×9): 75 mg via ORAL
  Filled 2024-03-16 (×2): qty 3
  Filled 2024-03-16: qty 2
  Filled 2024-03-16: qty 3
  Filled 2024-03-16: qty 2
  Filled 2024-03-16: qty 3
  Filled 2024-03-16 (×2): qty 2

## 2024-03-16 MED ORDER — SUGAMMADEX SODIUM 200 MG/2ML IV SOLN
INTRAVENOUS | Status: DC | PRN
  Administered 2024-03-16 (×2): 280.2 mg via INTRAVENOUS

## 2024-03-16 MED ORDER — CHLORHEXIDINE GLUCONATE 0.12 % MT SOLN
OROMUCOSAL | Status: AC
  Administered 2024-03-16 (×2): 15 mL via OROMUCOSAL
  Filled 2024-03-16: qty 15

## 2024-03-16 MED ORDER — ONDANSETRON HCL 4 MG/2ML IJ SOLN
INTRAMUSCULAR | Status: DC | PRN
  Administered 2024-03-16 (×2): 4 mg via INTRAVENOUS

## 2024-03-16 MED ORDER — OXYCODONE HCL 5 MG/5ML PO SOLN: 5.0000 mg | Freq: Once | ORAL | Status: DC | PRN

## 2024-03-16 MED ORDER — ALBUTEROL SULFATE HFA 108 (90 BASE) MCG/ACT IN AERS
INHALATION_SPRAY | RESPIRATORY_TRACT | Status: AC
  Filled 2024-03-16: qty 6.7

## 2024-03-16 MED ORDER — HYDROMORPHONE HCL 1 MG/ML IJ SOLN
0.5000 mg | INTRAMUSCULAR | Status: DC | PRN
  Administered 2024-03-17: 0.5 mg via INTRAVENOUS
  Filled 2024-03-16: qty 0.5

## 2024-03-16 SURGICAL SUPPLY — 26 items
BAG COUNTER SPONGE SURGICOUNT (BAG) ×1 IMPLANT
BNDG GAUZE DERMACEA FLUFF 4 (GAUZE/BANDAGES/DRESSINGS) IMPLANT
CANISTER SUCTION 3000ML PPV (SUCTIONS) ×1 IMPLANT
CLEANSER WND VASHE INSTL 34OZ (WOUND CARE) IMPLANT
COVER SURGICAL LIGHT HANDLE (MISCELLANEOUS) ×1 IMPLANT
DRAPE LAPAROSCOPIC ABDOMINAL (DRAPES) IMPLANT
ELECT CAUTERY BLADE 6.4 (BLADE) ×1 IMPLANT
ELECTRODE REM PT RTRN 9FT ADLT (ELECTROSURGICAL) ×1 IMPLANT
GAUZE PAD ABD 8X10 STRL (GAUZE/BANDAGES/DRESSINGS) IMPLANT
GAUZE SPONGE 4X4 12PLY STRL (GAUZE/BANDAGES/DRESSINGS) IMPLANT
GLOVE BIO SURGEON STRL SZ7.5 (GLOVE) ×1 IMPLANT
GLOVE BIOGEL PI IND STRL 8 (GLOVE) ×1 IMPLANT
GOWN STRL REUS W/ TWL LRG LVL3 (GOWN DISPOSABLE) ×1 IMPLANT
KIT BASIN OR (CUSTOM PROCEDURE TRAY) ×1 IMPLANT
KIT TURNOVER KIT B (KITS) ×1 IMPLANT
NS IRRIG 1000ML POUR BTL (IV SOLUTION) ×1 IMPLANT
PACK GENERAL/GYN (CUSTOM PROCEDURE TRAY) ×1 IMPLANT
PACK LITHOTOMY IV (CUSTOM PROCEDURE TRAY) IMPLANT
PAD ARMBOARD POSITIONER FOAM (MISCELLANEOUS) ×1 IMPLANT
PENCIL SMOKE EVACUATOR (MISCELLANEOUS) ×1 IMPLANT
SET HNDPC FAN SPRY TIP SCT (DISPOSABLE) IMPLANT
SUT VICRYL 2-0 SH 8X27 (SUTURE) IMPLANT
SWAB COLLECTION DEVICE MRSA (MISCELLANEOUS) IMPLANT
SWAB CULTURE ESWAB REG 1ML (MISCELLANEOUS) IMPLANT
TOWEL GREEN STERILE (TOWEL DISPOSABLE) ×1 IMPLANT
TOWEL GREEN STERILE FF (TOWEL DISPOSABLE) ×1 IMPLANT

## 2024-03-16 NOTE — Anesthesia Procedure Notes (Signed)
 Procedure Name: Intubation Date/Time: 03/16/2024 8:49 AM  Performed by: Harrold Macintosh, CRNAPre-anesthesia Checklist: Patient identified, Emergency Drugs available, Suction available and Patient being monitored Patient Re-evaluated:Patient Re-evaluated prior to induction Oxygen Delivery Method: Circle system utilized Preoxygenation: Pre-oxygenation with 100% oxygen Induction Type: IV induction Ventilation: Mask ventilation without difficulty Laryngoscope Size: Miller and 2 Grade View: Grade I Tube type: Oral Tube size: 7.0 mm Number of attempts: 1 Airway Equipment and Method: Stylet and Bite block Placement Confirmation: ETT inserted through vocal cords under direct vision, positive ETCO2 and breath sounds checked- equal and bilateral Secured at: 21 cm Tube secured with: Tape Dental Injury: Teeth and Oropharynx as per pre-operative assessment

## 2024-03-16 NOTE — Transfer of Care (Signed)
 Immediate Anesthesia Transfer of Care Note  Patient: [name]  Procedure(s) Performed: INCISION AND DRAINAGE, ABSCESS, PERIRECTAL (Perineum)  Patient Location: PACU  Anesthesia Type:General  Level of Consciousness: awake, alert , and oriented  Airway & Oxygen Therapy: Patient Spontanous Breathing and Patient connected to nasal cannula oxygen  Post-op Assessment: Report given to RN, Post -op Vital signs reviewed and stable, Patient moving all extremities X 4, and Patient able to stick tongue midline  Post vital signs: Reviewed and stable  Last Vitals:  Vitals Value Taken Time  BP 95/49 03/16/24 09:45  Temp 98.9   Pulse 120 03/16/24 09:46  Resp 31 03/16/24 09:46  SpO2 96 % 03/16/24 09:46  Vitals shown include unfiled device data.  Last Pain:  Vitals:   03/16/24 0756  TempSrc: Oral  PainSc: 0-No pain      Patients Stated Pain Goal: 2 (03/16/24 0756)  Complications: No notable events documented.

## 2024-03-16 NOTE — Op Note (Signed)
   Patient: [name] (1980/08/07, [id number])  Date of Surgery: 03/16/2024  Preoperative Diagnosis: Necrotizing Soft Tissue Infection Labia and Perineum   Postoperative Diagnosis: Necrotizing Soft Tissue Infection Labia and Perineum   Surgical Procedure: Sharp excisional debridement using electrocautery and scalpel of left labial wound measuring 8cm wide, 8cm deep and 16 cm anterior/posterior    Operative Team Members:  Surgeons and Role:    * Dola Lunsford, Deward PARAS, MD - Primary   Anesthesiologist: Peggye Delon Brunswick, MD CRNA: Harrold Macintosh, CRNA   Anesthesia: General   Fluids:  No intake/output data recorded.  Complications: * No complications entered in OR log *  Drains:  none , wet to dry packing  Specimen: * No specimens in log *   Disposition:  PACU - hemodynamically stable.  Plan of Care: Continue inpatient care    Indications for Procedure: [name] is a 43 y.o. female who presented with a necrotizing soft tissue infection of the left labia.  She underwent excision on 03/14/24, by Dr. Polly.  I recommended return to the operating room for additional debridement to ensure all necrotic tissue was removed and wound was open enough to be packed.  The procedure itself as well as its risks, benefits and alternatives were discussed.  The risks discussed included but were not limited to the risk of infection, bleeding, damage to nearby structures, and spreading infection.  After a full discussion and all questions answered the patient granted consent to proceed.  Findings: left labia with necrotic tissue tracking anteriorly over the anterior aspect of the groin musculature   Description of Procedure:   On the date stated both the patient taken the operating room suite.  Anesthesia was induced.  The patient was placed in lithotomy position.  The left labia was prepped and draped in usual sterile fashion.  A timeout was completed verify the correct  patient, procedure, position, and equipment needed for the case.  I inspected the labial wound.  There was some necrotic tissue tracking anteriorly which was sharply debrided using electrocautery and sharp scalpel dissection.  Hemostasis was obtained with electrocautery and with a few Vicryl sutures.  The wound was difficult to pack, so some additional tissue was also removed from the lateral aspect of the wound.  The entire debridement area was 8 cm wide by 8 cm deep by 16 cm anterior posterior.  There was some tracking anteriorly at the base of the wound.  The wound was irrigated with Vashe and then saline and then a Kerlix roll was used to pack the wound.  A sterile dressing was applied with mesh underwear.  At the end of the case we reviewed the infection status of the case. Patient: [name] Patient Case: Urgent Infection Present At Time Of Surgery (PATOS): Infected wound  Deward Foy, MD General, Bariatric, & Minimally Invasive Surgery [facility], [place]

## 2024-03-16 NOTE — Progress Notes (Signed)
 Progress Note  * Day of Surgery *  Subjective: Seen in preop  Objective: Vital signs in last 24 hours: Temp:  [97.7 F (36.5 C)-99.1 F (37.3 C)] 98.9 F (37.2 C) (08/13 0756) Pulse Rate:  [96-112] 96 (08/13 0756) Resp:  [16-20] 16 (08/13 0756) BP: (138-144)/(84-94) 143/84 (08/13 0756) SpO2:  [93 %-98 %] 98 % (08/13 0756)    Intake/Output from previous day: 08/12 0701 - 08/13 0700 In: 2208.5 [I.V.:255.9; IV Piggyback:1952.5] Out: -  Intake/Output this shift: No intake/output data recorded.  PE: General: pleasant, WD, obese female who is laying in bed in NAD Heart: regular, rate, and rhythm. Palpable pedal pulses bilaterally Lungs: Respiratory effort nonlabored Abd: soft, NT, ND GU: wound with some necrotic appearing tissue in base and small amount purulent drainage more superiorly, mild surrounding cellulitis of mons and left inner thigh   Psych: A&Ox3 with an appropriate affect.    Lab Results:  Recent Labs    03/15/24 0248 03/16/24 0456  WBC 23.3* 18.5*  HGB 9.3* 9.3*  HCT 29.3* 29.9*  PLT 432* 469*   BMET Recent Labs    03/15/24 0248 03/16/24 0456  NA 133* 137  K 3.9 4.0  CL 101 105  CO2 24 23  GLUCOSE 262* 228*  BUN 18 15  CREATININE 0.79 0.72  CALCIUM  10.6* 10.7*   PT/INR Recent Labs    03/13/24 2139  LABPROT 16.4*  INR 1.3*   CMP     Component Value Date/Time   NA 137 03/16/2024 0456   NA 136 03/10/2024 1458   K 4.0 03/16/2024 0456   CL 105 03/16/2024 0456   CO2 23 03/16/2024 0456   GLUCOSE 228 (H) 03/16/2024 0456   BUN 15 03/16/2024 0456   BUN 33 (H) 03/10/2024 1458   CREATININE 0.72 03/16/2024 0456   CALCIUM  10.7 (H) 03/16/2024 0456   PROT 6.1 (L) 03/14/2024 0340   PROT 7.5 11/02/2023 0933   ALBUMIN  1.9 (L) 03/14/2024 0340   ALBUMIN  3.8 (L) 11/02/2023 0933   AST 21 03/14/2024 0340   ALT 20 03/14/2024 0340   ALKPHOS 202 (H) 03/14/2024 0340   BILITOT 1.3 (H) 03/14/2024 0340   BILITOT 0.5 11/02/2023 0933   GFRNONAA >60  03/16/2024 0456   GFRAA 108 08/28/2020 1735   Lipase     Component Value Date/Time   LIPASE 46 05/15/2021 1058       Studies/Results: CT HEAD WO CONTRAST ( ) Result Date: 03/16/2024 CLINICAL DATA:  43 year old female admitted with perineum infection. Progressive headache. EXAM: CT HEAD WITHOUT CONTRAST TECHNIQUE: Contiguous axial images were obtained from the base of the skull through the vertex without intravenous contrast. RADIATION DOSE REDUCTION: This exam was performed according to the departmental dose-optimization program which includes automated exposure control, adjustment of the mA and/or kV according to patient size and/or use of iterative reconstruction technique. COMPARISON:  Head CT 11/10/2023. FINDINGS: Brain: Cerebral volume stable, within normal limits. No midline shift, ventriculomegaly, mass effect, evidence of mass lesion, intracranial hemorrhage or evidence of cortically based acute infarction. Gray-white matter differentiation is within normal limits throughout the brain. Vascular: No suspicious intracranial vascular hyperdensity. Skull: Stable and intact. Sinuses/Orbits: Visualized paranasal sinuses and mastoids are stable and well aerated. Other: Visualized orbits and scalp soft tissues are within normal limits. IMPRESSION: Stable and normal noncontrast Head CT. Electronically Signed   By: VEAR Hurst M.D.   On: 03/16/2024 07:12    Anti-infectives: Anti-infectives (From admission, onward)    Start  Dose/Rate Route Frequency Ordered Stop   03/14/24 1000  [MAR Hold]  meropenem  (MERREM ) 1 g in sodium chloride  0.9 % 100 mL IVPB        (MAR Hold since Wed 03/16/2024 at 0747.Hold Reason: Transfer to a Procedural area)   1 g 33.3 mL/hr over 180 Minutes Intravenous Every 8 hours 03/13/24 2140 03/24/24 0959   03/13/24 2200  [MAR Hold]  linezolid  (ZYVOX ) IVPB 600 mg        (MAR Hold since Wed 03/16/2024 at 0747.Hold Reason: Transfer to a Procedural area)   600 mg 300 mL/hr over  60 Minutes Intravenous Every 12 hours 03/13/24 2055 03/23/24 2159   03/13/24 2130  meropenem  (MERREM ) 1 g in sodium chloride  0.9 % 100 mL IVPB        1 g 33.3 mL/hr over 180 Minutes Intravenous  Once 03/13/24 2055 03/14/24 0455   03/13/24 1900  clindamycin  (CLEOCIN ) IVPB 600 mg        600 mg 100 mL/hr over 30 Minutes Intravenous  Once 03/13/24 1859 03/14/24 1608   03/13/24 1730  vancomycin  (VANCOCIN ) IVPB 1000 mg/200 mL premix       Placed in Followed by Linked Group   1,000 mg 200 mL/hr over 60 Minutes Intravenous  Once 03/13/24 1625 03/13/24 2010   03/13/24 1730  ceFEPIme  (MAXIPIME ) 2 g in sodium chloride  0.9 % 100 mL IVPB        2 g 200 mL/hr over 30 Minutes Intravenous  Once 03/13/24 1728 03/13/24 1835   03/13/24 1630  vancomycin  (VANCOCIN ) IVPB 1000 mg/200 mL premix       Placed in Followed by Linked Group   1,000 mg 200 mL/hr over 60 Minutes Intravenous  Once 03/13/24 1625 03/13/24 1751        Assessment/Plan NSTI of left labia/perineum - s/p I&D 8/10 last night by Dr. Polly - Plan for debridement in OR today.  Discussed risks, benefits and alternative  and patient granted consent to proceed - continue BS IV abx, GS with abundant G- rods and moderate G+ cocci  FEN: CM diet, IVF per [facility] VTE: ok to have LMWH or SQH from surgery standpoint ID: meropenem  and linezolid   - per [facility] -  Morbid obesity - BMI 50.33 T2DM - A1c on 8/7 8.8 HTN Mild intermittent asthma  LOS: 3 days    Deward JINNY Foy, MD  [facility] 03/16/2024, 8:27 AM Please see Amion for pager number during day hours 7:00am-4:30pm

## 2024-03-16 NOTE — Congregational Nurse Program (Addendum)
 Reached out to on call MD for pt reporting severe pain at the start of shift and current pain regimen could possibly need to be increased/ adjusted following second I&D today. Pt reports primary MD told her to let him know if pain regimen would possibly need to be increased to give adequate pain control.  -Per secure chat Segars, MD; orders decreased from the previous dosages and stated    Hey so I know this seems counterintuitive, but looks like mainly was just getting dilaudid  1 mg but no oxy. I changed order for the IV to be for breakthrough. So can get the PO as needed and the dilaudid  on top. Since going to be getting both, I backed off to 0.5 on the IV. just lmk if not controlled. Can give ~ 1 hr after the oxy dose and then do q4 prn   Primary RN did respond that patient was getting Oxy, Robaxin  and IV dilaudid  1mg  during the night of 8/12. rotating for adequate pain control/ lasting coverage, Probable OR visit today and MAR hold and unhold may not accurately reflect medications previous adminstrations.   Dressing change done at 0300 AM 03/17/24.  Area of L thigh is Warm, red, and increasing severe pain more so than I/D sites to Labia.

## 2024-03-16 NOTE — Anesthesia Postprocedure Evaluation (Signed)
 Anesthesia Post Note  Patient: [name]  Procedure(s) Performed: INCISION AND DRAINAGE, ABSCESS, PERIRECTAL (Perineum)     Patient location during evaluation: PACU Anesthesia Type: General Level of consciousness: awake Pain management: pain level controlled Vital Signs Assessment: post-procedure vital signs reviewed and stable Respiratory status: spontaneous breathing, nonlabored ventilation and respiratory function stable Cardiovascular status: blood pressure returned to baseline and stable Postop Assessment: no apparent nausea or vomiting Anesthetic complications: no   No notable events documented.  Last Vitals:  Vitals:   03/16/24 1020 03/16/24 1112  BP: 108/64 116/69  Pulse: (!) 108 99  Resp: (!) 22 20  Temp: 36.4 C 36.7 C  SpO2: 96% 93%    Last Pain:  Vitals:   03/16/24 1112  TempSrc: Oral  PainSc:                  Delon Aisha Arch

## 2024-03-16 NOTE — Plan of Care (Signed)

## 2024-03-16 NOTE — Progress Notes (Signed)
 [facility]   ROUNDING   NOTE [name] FMW:[id number]  DOB: 1981-07-04  DOA: 03/13/2024  PCP: Georgina Speaks, FNP  03/16/2024,1:51 PM  LOS: 3 days    Code Status:  full     From: Home current Dispo: Unclear    43 year old black female with poorly controlled DM ty ii G2 P0-0-1-0 Presumed OSA, OHSS BMI 51  8/10 developed abscess on buttock that enlarged from upper thigh noticed on 03/07/2024, saw PCP started on Keflex  but developed worsening pain no fever   on arrival found to have a very large abscess  an ED general surgery/ OB/GYN consulted as CT showed moderate volume air in subcutaneous tissue near left labia and perineum with innumerable liver lesions pulmonary nodules and splenomegaly  found to have severe sepsis on admission with necrotizing fasciitis to the left labia-underwent excisional I&D wound measuring 15 x 6 x 5 cm 8/12 secondary to liver lesions found to have cirrhosis with multiple enhancing lesions suggesting HCC recommending correlate with alpha-fetoprotein levels?  Hepatoma?  Needs liver protocol CT-1 cm subcapsular hyperintense lesion peripheral segment 7 indeterminant-mild splenomegaly with diffuse iron  deposition 8/13 underwent repeat I&D 8 x 8 cm 6 cm anteroposteriorly Maintained on initially broad-spectrum coverage narrowed to ceftriaxone    Plan  Large labial abscess left side status post multiple debridements above Continue ceftriaxone  only given deep wound culture growing strep anginosus, blood cultures no growth so far Further operative management as per general surgery White count steadily improving Pain management Tylenol  1000 every 6, Oxy IR 5-10 Q4 as needed moderate Dilaudid  1 mg Q3 as needed, continue Robaxin  500 every 8 as needed  Liver lesions unknown primary and?  Hypercalcemia Peak calcium  was 12.1-currently is normalizing continue NS 75 cc/H Will ask oncology for input--eventually we will probably need biopsy once everything stabilizes can be coordinated as an  outpatient  would get CEA and CA 19-9 alpha-feto-protein  History gestational diabetes Current diabetes mellitus TY 2 Home meds Lantus  40 daily in addition to metformin  1500 every morning as well as Trulicity  1.5 mg once a week Holding Farxiga  Trulicity -add back metformin  1.5 every morning, continues Lantus   Hypomagnesemia Improved since admission, give Mag-Ox 800 twice daily  Depression Continue Zoloft  75 daily resumed, continue propranolol  as needed at home   Data Reviewed:  WBC 18 hemoglobin 9.3 platelet 469  BUN/creatinine 15/0.7 sodium 137 magnesium  1.5   DVT prophylaxis: SCD  Status is: Inpatient Remains inpatient appropriate because:   Requires further operative management     Subjective: Awake alert just back from surgery had some food-is in severe pain but was medicated recently ate some lunch We talked about the concerns found on the liver-she does not recall any blood transfusions although she has tattoos Her significant other was in the room with her    Objective + exam Vitals:   03/16/24 1000 03/16/24 1015 03/16/24 1020 03/16/24 1112  BP: 97/60 106/70 108/64 116/69  Pulse: (!) 112 (!) 108 (!) 108 99  Resp: (!) 23 20 (!) 22 20  Temp:   97.6 F (36.4 C) 98.1 F (36.7 C)  TempSrc:    Oral  SpO2: 96% 95% 96% 93%  Weight:      Height:       Filed Weights   03/13/24 1559 03/14/24 0131 03/15/24 0500  Weight: 135.6 kg (!) 137.2 kg (!) 140.1 kg     Examination: EOMI NCAT thick neck Mallampati 4 No icterus no pallor Neck soft supple Chest is clear no wheeze S1-S2  no murmur Wound not examined Trace lower extremity edema Abdomen soft did not appreciate edge of liver     Scheduled Meds:  acetaminophen   1,000 mg Oral Q6H   fluticasone   1-2 spray Each Nare Daily   insulin  aspart  0-15 Units Subcutaneous TID WC   insulin  aspart  0-5 Units Subcutaneous QHS   insulin  glargine-yfgn  30 Units Subcutaneous Daily   loratadine   10 mg Oral Daily    pantoprazole   40 mg Oral Daily   Continuous Infusions:  sodium chloride  75 mL/hr at 03/16/24 0339   cefTRIAXone  (ROCEPHIN )  IV 2 g (03/16/24 1219)    Time 44  Colen Grimes, MD  [facility]

## 2024-03-17 ENCOUNTER — Ambulatory Visit: Admitting: Podiatry

## 2024-03-17 ENCOUNTER — Ambulatory Visit: Admitting: Physician Assistant

## 2024-03-17 ENCOUNTER — Inpatient Hospital Stay (HOSPITAL_COMMUNITY)

## 2024-03-17 ENCOUNTER — Encounter (HOSPITAL_COMMUNITY): Payer: Self-pay | Admitting: Surgery

## 2024-03-17 DIAGNOSIS — M726 Necrotizing fasciitis: Secondary | ICD-10-CM | POA: Diagnosis not present

## 2024-03-17 LAB — CBC WITH DIFFERENTIAL/PLATELET
Abs Immature Granulocytes: 1.23 K/uL — ABNORMAL HIGH (ref 0.00–0.07)
Basophils Absolute: 0.1 K/uL (ref 0.0–0.1)
Basophils Relative: 1 %
Eosinophils Absolute: 0.5 K/uL (ref 0.0–0.5)
Eosinophils Relative: 3 %
HCT: 29.2 % — ABNORMAL LOW (ref 36.0–46.0)
Hemoglobin: 9.3 g/dL — ABNORMAL LOW (ref 12.0–15.0)
Immature Granulocytes: 8 %
Lymphocytes Relative: 6 %
Lymphs Abs: 1 K/uL (ref 0.7–4.0)
MCH: 26.4 pg (ref 26.0–34.0)
MCHC: 31.8 g/dL (ref 30.0–36.0)
MCV: 83 fL (ref 80.0–100.0)
Monocytes Absolute: 1.4 K/uL — ABNORMAL HIGH (ref 0.1–1.0)
Monocytes Relative: 9 %
Neutro Abs: 12.1 K/uL — ABNORMAL HIGH (ref 1.7–7.7)
Neutrophils Relative %: 73 %
Platelets: UNDETERMINED K/uL (ref 150–400)
RBC: 3.52 MIL/uL — ABNORMAL LOW (ref 3.87–5.11)
RDW: 17.3 % — ABNORMAL HIGH (ref 11.5–15.5)
Smear Review: UNDETERMINED
WBC: 16.4 K/uL — ABNORMAL HIGH (ref 4.0–10.5)
nRBC: 0.6 % — ABNORMAL HIGH (ref 0.0–0.2)

## 2024-03-17 LAB — COMPREHENSIVE METABOLIC PANEL WITH GFR
ALT: 36 U/L (ref 0–44)
AST: 43 U/L — ABNORMAL HIGH (ref 15–41)
Albumin: 1.8 g/dL — ABNORMAL LOW (ref 3.5–5.0)
Alkaline Phosphatase: 293 U/L — ABNORMAL HIGH (ref 38–126)
Anion gap: 9 (ref 5–15)
BUN: 12 mg/dL (ref 6–20)
CO2: 25 mmol/L (ref 22–32)
Calcium: 10.1 mg/dL (ref 8.9–10.3)
Chloride: 106 mmol/L (ref 98–111)
Creatinine, Ser: 0.58 mg/dL (ref 0.44–1.00)
GFR, Estimated: >60 mL/min (ref >60)
Glucose, Bld: 147 mg/dL — ABNORMAL HIGH (ref 70–99)
Potassium: 4.3 mmol/L (ref 3.5–5.1)
Sodium: 140 mmol/L (ref 135–145)
Total Bilirubin: 0.7 mg/dL (ref 0.0–1.2)
Total Protein: 6.2 g/dL — ABNORMAL LOW (ref 6.5–8.1)

## 2024-03-17 LAB — URINE CULTURE: Culture: NO GROWTH

## 2024-03-17 LAB — GLUCOSE, CAPILLARY
Glucose-Capillary: 170 mg/dL — ABNORMAL HIGH (ref 70–99)
Glucose-Capillary: 140 mg/dL — ABNORMAL HIGH (ref 70–99)
Glucose-Capillary: 143 mg/dL — ABNORMAL HIGH (ref 70–99)
Glucose-Capillary: 200 mg/dL — ABNORMAL HIGH (ref 70–99)
Glucose-Capillary: 160 mg/dL — ABNORMAL HIGH (ref 70–99)
Glucose-Capillary: 243 mg/dL — ABNORMAL HIGH (ref 70–99)

## 2024-03-17 LAB — AEROBIC/ANAEROBIC CULTURE W GRAM STAIN (SURGICAL/DEEP WOUND)

## 2024-03-17 LAB — MAGNESIUM: Magnesium: 1.5 mg/dL — ABNORMAL LOW (ref 1.7–2.4)

## 2024-03-17 MED ORDER — METFORMIN HCL ER 500 MG PO TB24
1500.0000 mg | ORAL_TABLET | Freq: Every day | ORAL | Status: DC
  Administered 2024-03-19 – 2024-03-23 (×5): 1500 mg via ORAL
  Filled 2024-03-17: qty 3
  Filled 2024-03-17: qty 2
  Filled 2024-03-17: qty 3
  Filled 2024-03-17 (×2): qty 2
  Filled 2024-03-17 (×2): qty 3

## 2024-03-17 MED ORDER — OXYCODONE-ACETAMINOPHEN 7.5-325 MG PO TABS
2.0000 | ORAL_TABLET | ORAL | Status: DC | PRN
  Administered 2024-03-17 – 2024-03-23 (×21): 2 via ORAL
  Filled 2024-03-17 (×22): qty 2

## 2024-03-17 MED ORDER — HYDROMORPHONE HCL 1 MG/ML IJ SOLN
1.0000 mg | INTRAMUSCULAR | Status: DC | PRN
  Administered 2024-03-17 – 2024-03-22 (×21): 1 mg via INTRAVENOUS
  Filled 2024-03-17 (×22): qty 1

## 2024-03-17 MED ORDER — IBUPROFEN 400 MG PO TABS
600.0000 mg | ORAL_TABLET | Freq: Three times a day (TID) | ORAL | Status: DC
  Administered 2024-03-17 – 2024-03-20 (×9): 600 mg via ORAL
  Filled 2024-03-17: qty 3
  Filled 2024-03-17 (×2): qty 2
  Filled 2024-03-17 (×3): qty 3
  Filled 2024-03-17: qty 2
  Filled 2024-03-17 (×2): qty 3

## 2024-03-17 MED ORDER — HYDROMORPHONE HCL 1 MG/ML IJ SOLN
0.5000 mg | Freq: Once | INTRAMUSCULAR | Status: AC
  Administered 2024-03-17: 0.5 mg via INTRAVENOUS
  Filled 2024-03-17: qty 0.5

## 2024-03-17 MED ORDER — MAGNESIUM SULFATE 2 GM/50ML IV SOLN: 2.0000 g | Freq: Once | INTRAVENOUS | Status: DC

## 2024-03-17 MED ORDER — HYDROMORPHONE HCL 1 MG/ML IJ SOLN: 0.5000 mg | Freq: Once | INTRAMUSCULAR | Status: DC

## 2024-03-17 MED ORDER — SODIUM CHLORIDE 0.9 % IV SOLN
3.0000 g | Freq: Four times a day (QID) | INTRAVENOUS | Status: DC
  Administered 2024-03-17 – 2024-03-23 (×22): 3 g via INTRAVENOUS
  Filled 2024-03-17 (×25): qty 8

## 2024-03-17 MED ORDER — IOHEXOL 350 MG/ML SOLN
75.0000 mL | Freq: Once | INTRAVENOUS | Status: AC | PRN
  Administered 2024-03-17: 75 mL via INTRAVENOUS

## 2024-03-17 NOTE — Progress Notes (Signed)
 1 Day Post-Op  Subjective: CC: Seen with RN. Continued pain at her wound.   Afebrile. Tachycardia resolved. No hypotension. WBC not checked today.    Objective: Vital signs in last 24 hours: Temp:  [97.8 F (36.6 C)-99.3 F (37.4 C)] 97.8 F (36.6 C) (08/14 0735) Pulse Rate:  [85-104] 85 (08/14 0735) Resp:  [16-20] 19 (08/14 0517) BP: (110-165)/(69-116) 144/88 (08/14 0735) SpO2:  [93 %-98 %] 97 % (08/14 0735) Last BM Date : 03/16/24  Intake/Output from previous day: 08/13 0701 - 08/14 0700 In: 1377.8 [I.V.:500; IV Piggyback:877.8] Out: 10 [Blood:10] Intake/Output this shift: No intake/output data recorded.  PE: Gen:  Alert, NAD, pleasant GU: Chaperone present, RN [employer]. Left labial wound as seen below with mostly healthy granulation tissue with some mixed adipose and fibrinous tissue. The lateral aspect of the wound did have some purulent drainage that was expressed when palpating the left inner thigh next to the wound. The lateral portion of the wound where the purulent drainage was coming from was probed with sterile cotton swab and no obvious tract could be found. No fluctuance. The left inner thigh did have some erythema and induration without obvious fluctuance tracking from the lateral aspect of the wound. No crepitus.       Lab Results:  Recent Labs    03/15/24 0248 03/16/24 0456  WBC 23.3* 18.5*  HGB 9.3* 9.3*  HCT 29.3* 29.9*  PLT 432* 469*   BMET Recent Labs    03/16/24 0456 03/17/24 0936  NA 137 140  K 4.0 4.3  CL 105 106  CO2 23 25  GLUCOSE 228* 147*  BUN 15 12  CREATININE 0.72 0.58  CALCIUM  10.7* 10.1   PT/INR No results for input(s): LABPROT, INR in the last 72 hours. CMP     Component Value Date/Time   NA 140 03/17/2024 0936   NA 136 03/10/2024 1458   K 4.3 03/17/2024 0936   CL 106 03/17/2024 0936   CO2 25 03/17/2024 0936   GLUCOSE 147 (H) 03/17/2024 0936   BUN 12 03/17/2024 0936   BUN 33 (H) 03/10/2024 1458    CREATININE 0.58 03/17/2024 0936   CALCIUM  10.1 03/17/2024 0936   CALCIUM  10.4 (H) 03/15/2024 1137   PROT 6.2 (L) 03/17/2024 0936   PROT 7.5 11/02/2023 0933   ALBUMIN  1.8 (L) 03/17/2024 0936   ALBUMIN  3.8 (L) 11/02/2023 0933   AST 43 (H) 03/17/2024 0936   ALT 36 03/17/2024 0936   ALKPHOS 293 (H) 03/17/2024 0936   BILITOT 0.7 03/17/2024 0936   BILITOT 0.5 11/02/2023 0933   GFRNONAA >60 03/17/2024 0936   GFRAA 108 08/28/2020 1735   Lipase     Component Value Date/Time   LIPASE 46 05/15/2021 1058    Studies/Results: MR LIVER W WO CONTRAST Result Date: 03/16/2024 EXAM: MRI ABDOMEN 03/15/2024 09:12:13 PM TECHNIQUE: Multiplanar multisequence MRI of the abdomen was performed with and without the administration of intravenous contrast. COMPARISON: 03/13/2024 CLINICAL HISTORY: Multiple liver lesions. FINDINGS: LIVER: The liver has a diffusely nodular contour compatible with cirrhosis. No arterial phase enhancing liver lesions identified. No abnormal areas of restricted diffusion within the periphery of segments 7. There is a 1 cm subcapsular T2 hyperintense lesion on axial image 17/5. This does not show enhancement on the postcontrast images. GALLBLADDER AND BILIARY SYSTEM: Gallbladder appears decompressed. SPLEEN: The spleen appears heterogeneous on the T2-weighted sequences and post-contrast sequences. The spleen measures 13.3 cm in craniocaudal dimension. Increased signal throughout the spleen on  the out of phase sequences compatible with iron  deposition. PANCREAS: No pancreatic inflammation, mass or main duct dilatation. ADRENAL GLANDS: Unremarkable. KIDNEYS: No signs of hydronephrosis. There are 2 cysts within the right kidney measuring up to 1.5 cm. No follow up imaging recommended. LYMPH NODES: No abdominal adenopathy. VASCULATURE: Normal caliber of the abdominal aorta. PERITONEUM: No abdominal ascites. ABDOMINAL WALL: No hernia. No mass. BOWEL: Grossly unremarkable. No bowel obstruction. BONES:  No acute abnormality or worrisome osseous lesion. SOFT TISSUES: Unremarkable. IMPRESSION: 1. Diffusely nodular contour of the liver compatible with cirrhosis. No arterial phase enhancing liver lesions identified to suggest HCC. Recommend correlation with alpha-fetoprotein levels. Given the limitations of the current exam due to motion artifact and patient body habitus, if there is a high clinical concern for underlying hepatoma recommend repeat imaging with dedicated liver protocol CT. 2. 1 cm subcapsular T2 hyperintense lesion in the periphery of segment 7, without postcontrast enhancement. Indeterminate. Consider follow up imaging in 3 to 6 months. 3. Mild splenomegaly with signs of diffuse iron  deposition. Electronically signed by: Waddell Calk MD 03/16/2024 08:47 AM EDT RP Workstation: [id number]   CT HEAD WO CONTRAST ( ) Result Date: 03/16/2024 CLINICAL DATA:  43 year old female admitted with perineum infection. Progressive headache. EXAM: CT HEAD WITHOUT CONTRAST TECHNIQUE: Contiguous axial images were obtained from the base of the skull through the vertex without intravenous contrast. RADIATION DOSE REDUCTION: This exam was performed according to the departmental dose-optimization program which includes automated exposure control, adjustment of the mA and/or kV according to patient size and/or use of iterative reconstruction technique. COMPARISON:  Head CT 11/10/2023. FINDINGS: Brain: Cerebral volume stable, within normal limits. No midline shift, ventriculomegaly, mass effect, evidence of mass lesion, intracranial hemorrhage or evidence of cortically based acute infarction. Gray-white matter differentiation is within normal limits throughout the brain. Vascular: No suspicious intracranial vascular hyperdensity. Skull: Stable and intact. Sinuses/Orbits: Visualized paranasal sinuses and mastoids are stable and well aerated. Other: Visualized orbits and scalp soft tissues are within normal limits.  IMPRESSION: Stable and normal noncontrast Head CT. Electronically Signed   By: VEAR Hurst M.D.   On: 03/16/2024 07:12    Anti-infectives: Anti-infectives (From admission, onward)    Start     Dose/Rate Route Frequency Ordered Stop   03/16/24 1215  cefTRIAXone  (ROCEPHIN ) 2 g in sodium chloride  0.9 % 100 mL IVPB        2 g 200 mL/hr over 30 Minutes Intravenous Daily 03/16/24 1124     03/14/24 1000  meropenem  (MERREM ) 1 g in sodium chloride  0.9 % 100 mL IVPB  Status:  Discontinued        1 g 33.3 mL/hr over 180 Minutes Intravenous Every 8 hours 03/13/24 2140 03/16/24 1124   03/13/24 2200  linezolid  (ZYVOX ) IVPB 600 mg  Status:  Discontinued        600 mg 300 mL/hr over 60 Minutes Intravenous Every 12 hours 03/13/24 2055 03/16/24 1124   03/13/24 2130  meropenem  (MERREM ) 1 g in sodium chloride  0.9 % 100 mL IVPB        1 g 33.3 mL/hr over 180 Minutes Intravenous  Once 03/13/24 2055 03/14/24 0455   03/13/24 1900  clindamycin  (CLEOCIN ) IVPB 600 mg        600 mg 100 mL/hr over 30 Minutes Intravenous  Once 03/13/24 1859 03/14/24 1608   03/13/24 1730  vancomycin  (VANCOCIN ) IVPB 1000 mg/200 mL premix       Placed in Followed by Linked Group   1,000 mg 200  mL/hr over 60 Minutes Intravenous  Once 03/13/24 1625 03/13/24 2010   03/13/24 1730  ceFEPIme  (MAXIPIME ) 2 g in sodium chloride  0.9 % 100 mL IVPB        2 g 200 mL/hr over 30 Minutes Intravenous  Once 03/13/24 1728 03/13/24 1835   03/13/24 1630  vancomycin  (VANCOCIN ) IVPB 1000 mg/200 mL premix       Placed in Followed by Linked Group   1,000 mg 200 mL/hr over 60 Minutes Intravenous  Once 03/13/24 1625 03/13/24 1751        Assessment/Plan POD 3/1 s/p excisional debridement of NSTI of left labia/perineum - Suspect she will need another look in the OR tomorrow.  - Cont BID WTD - Cont abx, follow and narrow based on cx. Cx currently with strep anginosis, prevotella bivia, beta lactamase positive - We will follow with you   FEN: Okay  for CM diet, NPO midnight. IVF per [facility] VTE: SCDs, ok to have LMWH or SQH from surgery standpoint ID: Narrowed to Rocephin     - per [facility] -  Morbid obesity - BMI 50.33 T2DM - A1c on 8/7 8.8 HTN Mild intermittent asthma Liver masses and pulm nodules on CT  I reviewed nursing notes, hospitalist notes, last 24 h vitals and pain scores, last 48 h intake and output, last 24 h labs and trends, and last 24 h imaging results.   LOS: 4 days    Ozell CHRISTELLA Shaper, [facility] 03/17/2024, 11:02 AM Please see Amion for pager number during day hours 7:00am-4:30pm

## 2024-03-17 NOTE — Inpatient Diabetes Management (Signed)
 Inpatient Diabetes Program Recommendations  AACE/ADA: New Consensus Statement on Inpatient Glycemic Control (2015)  Target Ranges:  Prepandial:   less than 140 mg/dL      Peak postprandial:   less than 180 mg/dL (1-2 hours)      Critically ill patients:  140 - 180 mg/dL   Lab Results  Component Value Date   GLUCAP 140 (H) 03/17/2024   HGBA1C 8.8 (H) 03/10/2024    Review of Glycemic Control  Diabetes history: DM 2 Outpatient Diabetes medications: Farxiga  10 mg Daily, Trulicity  1.5 mg Weekly, Basaglar  40 mg Daily, metformin  1500 mg Daily Current orders for Inpatient glycemic control:  Metformin  1500 mg Daily Semglee  30 units Daily Novolog  0-15 units tid + hs  Sees Dr. Sam and is in close contact with her A1c 8.8% on 8/7  Note: Would not continue Farxiga  at time of discharge due to necrotizing fascitis diagnosis and increased risk with this medication.  Thanks,  Clotilda Bull RN, MSN, BC-ADM Inpatient Diabetes Coordinator Team Pager [phone] (8a-5p)

## 2024-03-17 NOTE — Consult Note (Signed)
 WOC Nurse Consult Note: Reason for Consult: Asked to consult on perineal wound and provide wound care guidance. Surgery has debrided the wound and is overseeing care.  Orders are in place for NS moist kerlix roll packed into wound daily.  NPWT (VAC) therapy would not stay sealed due to irregular shape and location/moisture of the area.  Wound type:surgical/infectious  Pressure Injury POA: NA Measurement: 16 cm x 8 cm x 8 cm per operative note 03/16/24.  Wound bed:not assessed Drainage (amount, consistency, odor) Heavy serosanguinous   Periwound: labia/vagina/buttocks Dressing procedure/placement/frequency:Continue wound care as ordered.  If you page surgical PA during wound care, they will assess during dressing change (per order)  Will not follow at this time.  Please re-consult if needed.  Stephanie Cooley MSN, RN, FNP-BC CWON Wound, Ostomy, Continence Nurse Outpatient [facility] [phone] Pager [phone]
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
## 2024-03-17 NOTE — Progress Notes (Signed)
 [facility]   ROUNDING   NOTE [name] FMW:[id number]  DOB: Apr 21, 1981  DOA: 03/13/2024  PCP: Georgina Speaks, FNP  03/17/2024,9:09 AM  LOS: 4 days    Code Status:  full     From: Home current Dispo: Unclear    43 year old black female with poorly controlled DM ty ii G2 P0-0-1-0 Presumed OSA, OHSS BMI 51  8/10 developed abscess on buttock that enlarged from upper thigh noticed on 03/07/2024, saw PCP started on Keflex  but developed worsening pain no fever   on arrival found to have a very large abscess  an ED general surgery/ OB/GYN consulted as CT showed moderate volume air in subcutaneous tissue near left labia and perineum with innumerable liver lesions pulmonary nodules and splenomegaly severe sepsis on admission with necrotizing fasciitis to the left labia-underwent excisional I&D wound measuring 15 x 6 x 5 cm 8/12 secondary to liver lesions found to have cirrhosis with multiple enhancing lesions suggesting HCC recommending correlate with alpha-fetoprotein levels?  Hepatoma?  Needs liver protocol CT-1 cm subcapsular hyperintense lesion peripheral segment 7 indeterminant-mild splenomegaly with diffuse iron  deposition 8/13 underwent repeat I&D 8 x 8 cm 6 cm anteroposteriorly--- secure chat and messaged Dr. Loretha of oncology to set up close outpatient follow-up for characterization of liver lesions Maintained on initially broad-spectrum coverage narrowed to ceftriaxone   Plan  Large labial abscess + strep andinosus left side status post multiple debridements above Continue ceftriaxone  -blood cultures no growth so far Further operative management as per general surgery--dressings to be delineated by general surgery-consulted wound care White count steadily improving Pain uncontrolled increased Oxy IR to Percocet 7.5 every 4 as needed, has as needed Dilaudid -I will add ibuprofen  600 3 times daily additionally short-term and discontinue scheduled Tylenol  She will need close monitoring of her pain levels  given discomfort with changes and area/location of pain Will add bariatric bed  Liver lesions unknown primary and?  Hypercalcemia Peak calcium  was 12.1-currently is normalizing continue NS 75 cc/H Case discussed with Dr. Loretha oncology who is happy to set her up and follow-up in the outpatient setting-workup can be completed as an outpatient no need of CEA CA 19-9 or labs currently-will need CT protocol with washout to determine if lesion on liver is hepatoma All this was discussed with patient-no need for inpatient management or further workup at this time but needs close follow-up  History gestational diabetes Current diabetes mellitus TY 2 Home meds Lantus  40 daily in addition to metformin  1500 every morning as well as Trulicity  1.5 mg once a week Holding Farxiga  Trulicity  CBGs ranging 140-170 on 30 units Lantus , moderate coverage  Hypomagnesemia Improved since admission, give Mag-Ox 800 twice daily-  Depression Continue Zoloft  75 daily resumed, continue propranolol  as needed at home   Data Reviewed:   Labs pending this morning   DVT prophylaxis: SCD  Status is: Inpatient Remains inpatient appropriate because:   Requires further operative management     Subjective:  Moderate to severe pain overnight-received Dilaudid  now pain is 7/10-concerned about erythema and induration at the bottom of the wound which I have looked at She has no oozing or other issues I did not examine the wound completely as surgery will be by   Objective + exam Vitals:   03/16/24 2048 03/16/24 2304 03/17/24 0517 03/17/24 0735  BP: (!) 165/96 (!) 155/116 136/89 (!) 144/88  Pulse: (!) 102 (!) 104 91 85  Resp: 19 16 19    Temp: 99.3 F (37.4 C) 98.9 F (37.2 C)  97.9 F (36.6 C) 97.8 F (36.6 C)  TempSrc: Oral Oral Oral Oral  SpO2: 97% 97% 96% 97%  Weight:      Height:       Filed Weights   03/13/24 1559 03/14/24 0131 03/15/24 0500  Weight: 135.6 kg (!) 137.2 kg (!) 140.1 kg      Examination:  EOMI NCAT no focal deficit no icterus no pallor no wheeze no rales no rhonchi S1-S2 no murmur Mallampati 4 ROM is intact moving 4 limbs equally She does have some induration and tenderness in the folds of her buttocks and at the base of the area of the wound I did not look at the wound however     Scheduled Meds:  acetaminophen   1,000 mg Oral Q6H   fluticasone   1-2 spray Each Nare Daily   insulin  aspart  0-15 Units Subcutaneous TID WC   insulin  aspart  0-5 Units Subcutaneous QHS   insulin  glargine-yfgn  30 Units Subcutaneous Daily   loratadine   10 mg Oral Daily   magnesium  oxide  800 mg Oral BID   pantoprazole   40 mg Oral Daily   sertraline   75 mg Oral Daily   Continuous Infusions:  sodium chloride  75 mL/hr at 03/17/24 0217   cefTRIAXone  (ROCEPHIN )  IV 75 mL/hr at 03/16/24 2334    Time 44  Colen Grimes, MD  [facility]

## 2024-03-17 NOTE — Progress Notes (Signed)
 PT Cancellation Note  Patient Details Name: ZAELA GRALEY MRN: [id number] DOB: [birth year]   Cancelled Treatment:    Reason Eval/Treat Not Completed: Other (comment) (Patient agreeable to PT. After sitting edge of bed, surgery came to assess wound and patient required to get back to bed. PT will continue with attempts as able.)  Randine Essex, PT, MPT   [name] 03/17/2024, 1:01 PM

## 2024-03-17 NOTE — Plan of Care (Signed)
  Problem: Education: Goal: Knowledge of General Education information will improve Description: Including pain rating scale, medication(s)/side effects and non-pharmacologic comfort measures Outcome: Progressing   Problem: Nutrition: Goal: Adequate nutrition will be maintained Outcome: Progressing   Problem: Activity: Goal: Risk for activity intolerance will decrease Outcome: Progressing   Problem: Coping: Goal: Level of anxiety will decrease Outcome: Progressing   Problem: Elimination: Goal: Will not experience complications related to bowel motility Outcome: Progressing   Problem: Elimination: Goal: Will not experience complications related to urinary retention Outcome: Progressing   Problem: Pain Managment: Goal: General experience of comfort will improve and/or be controlled Outcome: Progressing

## 2024-03-18 ENCOUNTER — Other Ambulatory Visit: Payer: Self-pay

## 2024-03-18 ENCOUNTER — Encounter (HOSPITAL_COMMUNITY)
Admission: AD | Disposition: A | Discharge status: Home Health | Payer: Self-pay | Source: Home / Self Care | Attending: Internal Medicine

## 2024-03-18 ENCOUNTER — Inpatient Hospital Stay (HOSPITAL_COMMUNITY): Admitting: Certified Registered"

## 2024-03-18 ENCOUNTER — Encounter (HOSPITAL_COMMUNITY): Payer: Self-pay | Admitting: Internal Medicine

## 2024-03-18 DIAGNOSIS — M726 Necrotizing fasciitis: Secondary | ICD-10-CM | POA: Diagnosis not present

## 2024-03-18 HISTORY — PX: IRRIGATION AND DEBRIDEMENT OF NECROTIZING SOFT TISSUE INFECTION: SHX7317

## 2024-03-18 LAB — COMPREHENSIVE METABOLIC PANEL WITH GFR
ALT: 69 U/L — ABNORMAL HIGH (ref 0–44)
AST: 84 U/L — ABNORMAL HIGH (ref 15–41)
Albumin: 1.9 g/dL — ABNORMAL LOW (ref 3.5–5.0)
Alkaline Phosphatase: 364 U/L — ABNORMAL HIGH (ref 38–126)
Anion gap: 6 (ref 5–15)
BUN: 11 mg/dL (ref 6–20)
CO2: 25 mmol/L (ref 22–32)
Calcium: 9.5 mg/dL (ref 8.9–10.3)
Chloride: 106 mmol/L (ref 98–111)
Creatinine, Ser: 0.67 mg/dL (ref 0.44–1.00)
GFR, Estimated: >60 mL/min (ref >60)
Glucose, Bld: 124 mg/dL — ABNORMAL HIGH (ref 70–99)
Potassium: 4.7 mmol/L (ref 3.5–5.1)
Sodium: 137 mmol/L (ref 135–145)
Total Bilirubin: 0.5 mg/dL (ref 0.0–1.2)
Total Protein: 6.4 g/dL — ABNORMAL LOW (ref 6.5–8.1)

## 2024-03-18 LAB — CBC
HCT: 28.6 % — ABNORMAL LOW (ref 36.0–46.0)
Hemoglobin: 9 g/dL — ABNORMAL LOW (ref 12.0–15.0)
MCH: 26.3 pg (ref 26.0–34.0)
MCHC: 31.5 g/dL (ref 30.0–36.0)
MCV: 83.6 fL (ref 80.0–100.0)
Platelets: 458 K/uL — ABNORMAL HIGH (ref 150–400)
RBC: 3.42 MIL/uL — ABNORMAL LOW (ref 3.87–5.11)
RDW: 17.3 % — ABNORMAL HIGH (ref 11.5–15.5)
WBC: 19.4 K/uL — ABNORMAL HIGH (ref 4.0–10.5)
nRBC: 0.9 % — ABNORMAL HIGH (ref 0.0–0.2)

## 2024-03-18 LAB — CULTURE, BLOOD (ROUTINE X 2)
Culture: NO GROWTH
Culture: NO GROWTH
Special Requests: ADEQUATE
Special Requests: ADEQUATE

## 2024-03-18 LAB — GLUCOSE, CAPILLARY
Glucose-Capillary: 203 mg/dL — ABNORMAL HIGH (ref 70–99)
Glucose-Capillary: 82 mg/dL (ref 70–99)
Glucose-Capillary: 79 mg/dL (ref 70–99)
Glucose-Capillary: 139 mg/dL — ABNORMAL HIGH (ref 70–99)
Glucose-Capillary: 290 mg/dL — ABNORMAL HIGH (ref 70–99)

## 2024-03-18 LAB — AEROBIC/ANAEROBIC CULTURE W GRAM STAIN (SURGICAL/DEEP WOUND)

## 2024-03-18 LAB — MAGNESIUM: Magnesium: 2 mg/dL (ref 1.7–2.4)

## 2024-03-18 SURGERY — IRRIGATION AND DEBRIDEMENT OF NECROTIZING SOFT TISSUE INFECTION
Anesthesia: General | Site: Groin | Laterality: Left

## 2024-03-18 MED ORDER — LIDOCAINE 2% (20 MG/ML) 5 ML SYRINGE
INTRAMUSCULAR | Status: DC | PRN
  Administered 2024-03-18: 100 mg via INTRAVENOUS

## 2024-03-18 MED ORDER — MIDAZOLAM HCL 2 MG/2ML IJ SOLN
INTRAMUSCULAR | Status: AC
  Filled 2024-03-18: qty 2

## 2024-03-18 MED ORDER — MIDAZOLAM HCL 2 MG/2ML IJ SOLN
INTRAMUSCULAR | Status: DC | PRN
  Administered 2024-03-18: 2 mg via INTRAVENOUS

## 2024-03-18 MED ORDER — OXYCODONE HCL 5 MG/5ML PO SOLN: 5.0000 mg | Freq: Once | ORAL | Status: DC | PRN

## 2024-03-18 MED ORDER — PROPOFOL 10 MG/ML IV BOLUS
INTRAVENOUS | Status: AC
  Filled 2024-03-18: qty 20

## 2024-03-18 MED ORDER — ONDANSETRON HCL 4 MG/2ML IJ SOLN
INTRAMUSCULAR | Status: DC | PRN
  Administered 2024-03-18: 4 mg via INTRAVENOUS

## 2024-03-18 MED ORDER — 0.9 % SODIUM CHLORIDE (POUR BTL) OPTIME
TOPICAL | Status: DC | PRN
  Administered 2024-03-18: 1000 mL

## 2024-03-18 MED ORDER — CHLORHEXIDINE GLUCONATE 0.12 % MT SOLN
OROMUCOSAL | Status: AC
  Administered 2024-03-18: 15 mL via OROMUCOSAL
  Filled 2024-03-18: qty 15

## 2024-03-18 MED ORDER — ACETAMINOPHEN 10 MG/ML IV SOLN
INTRAVENOUS | Status: AC
  Filled 2024-03-18: qty 100

## 2024-03-18 MED ORDER — ROCURONIUM BROMIDE 10 MG/ML (PF) SYRINGE
PREFILLED_SYRINGE | INTRAVENOUS | Status: DC | PRN
  Administered 2024-03-18: 60 mg via INTRAVENOUS

## 2024-03-18 MED ORDER — MAGNESIUM SULFATE 50 % IJ SOLN
3.0000 g | Freq: Once | INTRAVENOUS | Status: AC
  Administered 2024-03-18: 3 g via INTRAVENOUS
  Filled 2024-03-18 (×3): qty 6

## 2024-03-18 MED ORDER — FENTANYL CITRATE (PF) 250 MCG/5ML IJ SOLN
INTRAMUSCULAR | Status: AC
  Filled 2024-03-18: qty 5

## 2024-03-18 MED ORDER — CHLORHEXIDINE GLUCONATE 0.12 % MT SOLN: 15.0000 mL | Freq: Once | OROMUCOSAL | Status: AC

## 2024-03-18 MED ORDER — ORAL CARE MOUTH RINSE: 15.0000 mL | Freq: Once | OROMUCOSAL | Status: AC

## 2024-03-18 MED ORDER — PROPOFOL 10 MG/ML IV BOLUS
INTRAVENOUS | Status: DC | PRN
  Administered 2024-03-18: 200 mg via INTRAVENOUS

## 2024-03-18 MED ORDER — LACTATED RINGERS IV SOLN: INTRAVENOUS | Status: DC

## 2024-03-18 MED ORDER — ALBUTEROL SULFATE HFA 108 (90 BASE) MCG/ACT IN AERS
INHALATION_SPRAY | RESPIRATORY_TRACT | Status: DC | PRN
  Administered 2024-03-18: 6 via RESPIRATORY_TRACT

## 2024-03-18 MED ORDER — OXYCODONE HCL 5 MG PO TABS: 5.0000 mg | ORAL_TABLET | Freq: Once | ORAL | Status: DC | PRN

## 2024-03-18 MED ORDER — DEXAMETHASONE SODIUM PHOSPHATE 10 MG/ML IJ SOLN
INTRAMUSCULAR | Status: DC | PRN
  Administered 2024-03-18: 10 mg via INTRAVENOUS

## 2024-03-18 MED ORDER — DROPERIDOL 2.5 MG/ML IJ SOLN: 0.6250 mg | Freq: Once | INTRAMUSCULAR | Status: DC | PRN

## 2024-03-18 MED ORDER — ACETAMINOPHEN 10 MG/ML IV SOLN
1000.0000 mg | Freq: Once | INTRAVENOUS | Status: DC | PRN
  Administered 2024-03-18: 1000 mg via INTRAVENOUS

## 2024-03-18 MED ORDER — FENTANYL CITRATE (PF) 100 MCG/2ML IJ SOLN
INTRAMUSCULAR | Status: AC
Start: 2024-03-18 — End: 2024-03-18
  Filled 2024-03-18: qty 2

## 2024-03-18 MED ORDER — FENTANYL CITRATE (PF) 250 MCG/5ML IJ SOLN
INTRAMUSCULAR | Status: DC | PRN
  Administered 2024-03-18: 100 ug via INTRAVENOUS
  Administered 2024-03-18: 75 ug via INTRAVENOUS

## 2024-03-18 MED ORDER — VASHE WOUND IRRIGATION OPTIME
TOPICAL | Status: DC | PRN
  Administered 2024-03-18: 34 [oz_av]

## 2024-03-18 MED ORDER — FENTANYL CITRATE (PF) 100 MCG/2ML IJ SOLN
25.0000 ug | INTRAMUSCULAR | Status: DC | PRN
  Administered 2024-03-18: 50 ug via INTRAVENOUS

## 2024-03-18 MED ORDER — SUGAMMADEX SODIUM 200 MG/2ML IV SOLN
INTRAVENOUS | Status: DC | PRN
  Administered 2024-03-18: 300 mg via INTRAVENOUS
  Administered 2024-03-18: 200 mg via INTRAVENOUS

## 2024-03-18 SURGICAL SUPPLY — 29 items
BAG COUNTER SPONGE SURGICOUNT (BAG) ×1 IMPLANT
BNDG GAUZE DERMACEA FLUFF 4 (GAUZE/BANDAGES/DRESSINGS) IMPLANT
CANISTER SUCTION 3000ML PPV (SUCTIONS) ×1 IMPLANT
CLEANSER WND VASHE INSTL 34OZ (WOUND CARE) IMPLANT
COVER SURGICAL LIGHT HANDLE (MISCELLANEOUS) ×1 IMPLANT
DRAPE UTILITY XL STRL (DRAPES) ×1 IMPLANT
ELECTRODE REM PT RTRN 9FT ADLT (ELECTROSURGICAL) ×1 IMPLANT
GAUZE PAD ABD 8X10 STRL (GAUZE/BANDAGES/DRESSINGS) ×1 IMPLANT
GAUZE SPONGE 4X4 12PLY STRL (GAUZE/BANDAGES/DRESSINGS) ×1 IMPLANT
GLOVE BIO SURGEON STRL SZ7.5 (GLOVE) ×2 IMPLANT
GLOVE BIOGEL PI IND STRL 8 (GLOVE) ×1 IMPLANT
GOWN STRL REUS W/ TWL LRG LVL3 (GOWN DISPOSABLE) ×1 IMPLANT
GOWN STRL REUS W/ TWL XL LVL3 (GOWN DISPOSABLE) ×1 IMPLANT
KIT BASIN OR (CUSTOM PROCEDURE TRAY) ×1 IMPLANT
KIT TURNOVER KIT B (KITS) ×1 IMPLANT
NS IRRIG 1000ML POUR BTL (IV SOLUTION) ×1 IMPLANT
PACK LITHOTOMY IV (CUSTOM PROCEDURE TRAY) ×1 IMPLANT
PAD ARMBOARD POSITIONER FOAM (MISCELLANEOUS) ×1 IMPLANT
PENCIL SMOKE EVACUATOR (MISCELLANEOUS) ×1 IMPLANT
SOL PREP POV-IOD 4OZ 10% (MISCELLANEOUS) IMPLANT
SPONGE T-LAP 18X18 ~~LOC~~+RFID (SPONGE) ×1 IMPLANT
SWAB COLLECTION DEVICE MRSA (MISCELLANEOUS) IMPLANT
SWAB CULTURE ESWAB REG 1ML (MISCELLANEOUS) IMPLANT
SYR BULB IRRIG 60ML STRL (SYRINGE) IMPLANT
TOWEL GREEN STERILE (TOWEL DISPOSABLE) ×1 IMPLANT
TOWEL GREEN STERILE FF (TOWEL DISPOSABLE) ×1 IMPLANT
TUBE CONNECTING 12X1/4 (SUCTIONS) ×1 IMPLANT
UNDERPAD 30X36 HEAVY ABSORB (UNDERPADS AND DIAPERS) ×1 IMPLANT
YANKAUER SUCT BULB TIP NO VENT (SUCTIONS) ×1 IMPLANT

## 2024-03-18 NOTE — Progress Notes (Signed)
 * Day of Surgery *  Subjective: Seen in Preop.  Plan for additional debridement in OR today.   Objective: Vital signs in last 24 hours: Temp:  [98 F (36.7 C)-98.2 F (36.8 C)] (P) 98.2 F (36.8 C) (08/15 0917) Pulse Rate:  [81-90] (P) 84 (08/15 0917) Resp:  [16-18] (P) 16 (08/15 0917) BP: (142-158)/(90-109) 158/96 (08/15 0817) SpO2:  [96 %-97 %] (P) 97 % (08/15 0917) Weight:  [139.7 kg] (P) 139.7 kg (08/15 0917) Last BM Date : 03/17/24  Intake/Output from previous day: 08/14 0701 - 08/15 0700 In: 2686.2 [I.V.:2486.2; IV Piggyback:200] Out: -  Intake/Output this shift: No intake/output data recorded.  PE: Gen:  Alert, NAD, pleasant GU: Left open labial wound   Lab Results:  Recent Labs    03/16/24 0456 03/17/24 0936  WBC 18.5* 16.4*  HGB 9.3* 9.3*  HCT 29.9* 29.2*  PLT 469* PLATELET CLUMPS NOTED ON SMEAR, UNABLE TO ESTIMATE   BMET Recent Labs    03/16/24 0456 03/17/24 0936  NA 137 140  K 4.0 4.3  CL 105 106  CO2 23 25  GLUCOSE 228* 147*  BUN 15 12  CREATININE 0.72 0.58  CALCIUM  10.7* 10.1   PT/INR No results for input(s): LABPROT, INR in the last 72 hours. CMP     Component Value Date/Time   NA 140 03/17/2024 0936   NA 136 03/10/2024 1458   K 4.3 03/17/2024 0936   CL 106 03/17/2024 0936   CO2 25 03/17/2024 0936   GLUCOSE 147 (H) 03/17/2024 0936   BUN 12 03/17/2024 0936   BUN 33 (H) 03/10/2024 1458   CREATININE 0.58 03/17/2024 0936   CALCIUM  10.1 03/17/2024 0936   CALCIUM  10.4 (H) 03/15/2024 1137   PROT 6.2 (L) 03/17/2024 0936   PROT 7.5 11/02/2023 0933   ALBUMIN  1.8 (L) 03/17/2024 0936   ALBUMIN  3.8 (L) 11/02/2023 0933   AST 43 (H) 03/17/2024 0936   ALT 36 03/17/2024 0936   ALKPHOS 293 (H) 03/17/2024 0936   BILITOT 0.7 03/17/2024 0936   BILITOT 0.5 11/02/2023 0933   GFRNONAA >60 03/17/2024 0936   GFRAA 108 08/28/2020 1735   Lipase     Component Value Date/Time   LIPASE 46 05/15/2021 1058    Studies/Results: CT PELVIS W  CONTRAST Result Date: 03/17/2024 CLINICAL DATA:  Necrotizing fasciitis suspected EXAM: CT PELVIS WITH CONTRAST TECHNIQUE: Multidetector CT imaging of the pelvis was performed using the standard protocol following the bolus administration of intravenous contrast. RADIATION DOSE REDUCTION: This exam was performed according to the departmental dose-optimization program which includes automated exposure control, adjustment of the mA and/or kV according to patient size and/or use of iterative reconstruction technique. CONTRAST:  75mL OMNIPAQUE  IOHEXOL  350 MG/ML SOLN COMPARISON:  CT abdomen pelvis March 13, 2024. FINDINGS: Urinary Tract:  Bladder is unremarkable. Bowel: Necrotizing fascitis/abscess formation centered within the left perineum with interval decrease in fluid component status post incision and drainage. There is persistent tract of air extending from the perianal region anteriorly to the left labia major and mons pubis with a large collection of subcutaneous air (post incision and drainage), now more confluent measuring 11 x 2.6 cm (3/138). Additional punctate foci of air extend to the left inguinal region (3/95).Compared to prior exam the fluid collection is significantly decreased common near completely resolved. Persistent diffuse subcutaneous fat stranding. Large stool throughout the colon. Visualized bowel loops are otherwise within normal limits. No bowel obstruction. Vascular/Lymphatic: Multiple prominent enhancing bilateral inguinal lymphadenopathy left greater  than right likely reactive. Reproductive: Fibroid uterus. The left adnexal enhancing lesion measuring 2.8 by 3.7 cm containing a ring calcification measuring 17 mm may represent a pedunculated calcified fibroid versus an ovarian lesion. IUD in proper position in endometrial cavity. Other: Fat containing umbilical/paraumbilical multiloculated hernia. Partially visualized nodular liver Musculoskeletal: No suspicious osseous lesion IMPRESSION:  Perineal/left labia major subcutaneous abscess formation with interval near complete resolution of fluid collection, status post incision and drainage. Residual subcutaneous emphysematous changes and fat stranding as detail above. Bilateral inguinal enhancing lymphadenopathy likely reactive. Electronically Signed   By: Megan  Zare M.D.   On: 03/17/2024 18:29   CT FEMUR LEFT W CONTRAST Addendum Date: 03/17/2024 ADDENDUM REPORT: 03/17/2024 18:27 ADDENDUM: These results were called by telephone at the time of interpretation on 03/17/2024 at 6:27 pm to provider Dr. Royal, who verbally acknowledged these results. Electronically Signed   By: Greig Pique M.D.   On: 03/17/2024 18:27   Result Date: 03/17/2024 CLINICAL DATA:  Necrotizing fasciitis suspected. EXAM: CT OF THE LOWER LEFT EXTREMITY WITH CONTRAST TECHNIQUE: Multidetector CT imaging of the lower left extremity was performed according to the standard protocol following intravenous contrast administration. RADIATION DOSE REDUCTION: This exam was performed according to the departmental dose-optimization program which includes automated exposure control, adjustment of the mA and/or kV according to patient size and/or use of iterative reconstruction technique. CONTRAST:  75mL OMNIPAQUE  IOHEXOL  350 MG/ML SOLN COMPARISON:  None Available. FINDINGS: Bones/Joint/Cartilage There is no acute fracture or dislocation. No focal osseous lesion or cortical erosion identified. No joint effusion identified of the left hip or left knee. Ligaments Suboptimally assessed by CT. Muscles and Tendons There is some deep fascial plane edema of the medial compartment of the upper left thigh with a few small foci of air best seen on image 4/102. No focal hematoma or fluid collection. No foreign body. Soft tissues There is soft tissue ulceration in the inferior left perineum involving subcutaneous tissues. There is surrounding subcutaneous edema and air without focal fluid collection  identified. There is also subcutaneous edema and swelling of the left thigh with medial left thigh skin thickening. There is also some subcutaneous edema of the mons pubis. Small amount of air seen within the subcutaneous tissues of the left inguinal region as well. There are prominent bilateral inguinal lymph nodes. IUD is noted in the uterus. IMPRESSION: 1. Deep fascial plane edema of the medial compartment of the upper left thigh with a few small foci of air. Findings are concerning for necrotizing fasciitis. 2. Soft tissue ulceration in the inferior left perineum. Subcutaneous edema and swelling of the proximal left thigh and mons pubis with associated scattered subcutaneous foci of air worrisome for cellulitis. 3. No acute osseous abnormality. Electronically Signed: By: Greig Pique M.D. On: 03/17/2024 18:13    Anti-infectives: Anti-infectives (From admission, onward)    Start     Dose/Rate Route Frequency Ordered Stop   03/17/24 1600  [MAR Hold]  Ampicillin -Sulbactam (UNASYN ) 3 g in sodium chloride  0.9 % 100 mL IVPB        (MAR Hold since Fri 03/18/2024 at 0915.Hold Reason: Transfer to a Procedural area)   3 g 200 mL/hr over 30 Minutes Intravenous Every 6 hours 03/17/24 1456     03/16/24 1215  cefTRIAXone  (ROCEPHIN ) 2 g in sodium chloride  0.9 % 100 mL IVPB  Status:  Discontinued        2 g 200 mL/hr over 30 Minutes Intravenous Daily 03/16/24 1124 03/17/24 1456   03/14/24  1000  meropenem  (MERREM ) 1 g in sodium chloride  0.9 % 100 mL IVPB  Status:  Discontinued        1 g 33.3 mL/hr over 180 Minutes Intravenous Every 8 hours 03/13/24 2140 03/16/24 1124   03/13/24 2200  linezolid  (ZYVOX ) IVPB 600 mg  Status:  Discontinued        600 mg 300 mL/hr over 60 Minutes Intravenous Every 12 hours 03/13/24 2055 03/16/24 1124   03/13/24 2130  meropenem  (MERREM ) 1 g in sodium chloride  0.9 % 100 mL IVPB        1 g 33.3 mL/hr over 180 Minutes Intravenous  Once 03/13/24 2055 03/14/24 0455   03/13/24 1900   clindamycin  (CLEOCIN ) IVPB 600 mg        600 mg 100 mL/hr over 30 Minutes Intravenous  Once 03/13/24 1859 03/14/24 1608   03/13/24 1730  vancomycin  (VANCOCIN ) IVPB 1000 mg/200 mL premix       Placed in Followed by Linked Group   1,000 mg 200 mL/hr over 60 Minutes Intravenous  Once 03/13/24 1625 03/13/24 2010   03/13/24 1730  ceFEPIme  (MAXIPIME ) 2 g in sodium chloride  0.9 % 100 mL IVPB        2 g 200 mL/hr over 30 Minutes Intravenous  Once 03/13/24 1728 03/13/24 1835   03/13/24 1630  vancomycin  (VANCOCIN ) IVPB 1000 mg/200 mL premix       Placed in Followed by Linked Group   1,000 mg 200 mL/hr over 60 Minutes Intravenous  Once 03/13/24 1625 03/13/24 1751        Assessment/Plan POD 3/1 s/p excisional debridement of NSTI of left labia/perineum - CT reviewed from yesterday - Separate pocket of infection on thigh inferior to labial debridement, recommended return to OR for additional debridement.  We discussed the procedure, its risks, benefits and alternatives and the patient granted consent to proceed.     - per [facility] -  Morbid obesity - BMI 50.33 T2DM - A1c on 8/7 8.8 HTN Mild intermittent asthma Liver masses and pulm nodules on CT  I reviewed nursing notes, hospitalist notes, last 24 h vitals and pain scores, last 48 h intake and output, last 24 h labs and trends, and last 24 h imaging results.   LOS: 5 days    Deward JINNY Foy, MD [facility] Surgery 03/18/2024, 10:47 AM Please see Amion for pager number during day hours 7:00am-4:30pm

## 2024-03-18 NOTE — Plan of Care (Signed)
  Problem: Education: Goal: Knowledge of General Education information will improve Description: Including pain rating scale, medication(s)/side effects and non-pharmacologic comfort measures Outcome: Progressing   Problem: Health Behavior/Discharge Planning: Goal: Ability to manage health-related needs will improve Outcome: Progressing   Problem: Clinical Measurements: Goal: Will remain free from infection Outcome: Progressing   Problem: Activity: Goal: Risk for activity intolerance will decrease Outcome: Progressing   Problem: Nutrition: Goal: Adequate nutrition will be maintained Outcome: Progressing   Problem: Coping: Goal: Level of anxiety will decrease Outcome: Progressing   Problem: Elimination: Goal: Will not experience complications related to bowel motility Outcome: Progressing   Problem: Elimination: Goal: Will not experience complications related to urinary retention Outcome: Progressing   Problem: Safety: Goal: Ability to remain free from injury will improve Outcome: Progressing   Problem: Pain Managment: Goal: General experience of comfort will improve and/or be controlled Outcome: Progressing   Problem: Coping: Goal: Ability to adjust to condition or change in health will improve Outcome: Progressing

## 2024-03-18 NOTE — Op Note (Signed)
   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  Plan of Care: Continue inpatient care    Indications for Procedure: JASANI LENGEL is a 43 y.o. female who presented with a necrotizing soft tissue infection of the left labia.  She underwent excision on 03/14/24, by Dr. Polly, and excision 03/16/24, by myself.  I recommended return to the operating room for additional debridement to ensure all necrotic tissue was removed and wound was open enough to be packed.  A CT scan was completed to help identify the location of continued purulent drainage seen on dressing changes - it appears the infection tracks more onto the upper thigh.  The procedure itself as well as its risks, benefits and alternatives were discussed.  The risks discussed included but were not limited to the risk of infection, bleeding, damage to nearby structures, and spreading infection.  After a full discussion and all questions answered the patient granted consent to proceed.  Findings: Additional abscess cavity tracking into upper thigh opened, infected tissue excised and wound opened for easy packing.   Description of Procedure:   On the date stated both the patient  taken the operating room suite.  Anesthesia was induced.  The patient was placed in lithotomy position.  The left labia was prepped and draped in usual sterile fashion.  A timeout was completed verify the correct patient, procedure, position, and equipment needed for the case.  I inspected the labial wound. I explored the subcutaneous tissue in the upper thigh bluntly and encountered an abscess cavity which drained purulent fluid.  The infected tissue in this area was excised using scalpel and electrocautery.  All necrotic appearing tissue was excised.  Hemostasis was obtained with electrocautery.  The entire debridement area was 16 cm wide by 8 cm deep by 16 cm anterior posterior.   The wound was irrigated with Vashe and then saline and then a Kerlix roll was used to pack the wound.  A sterile dressing was applied with mesh underwear.  At the end of the case we reviewed the infection status of the case. Patient: [name] Patient Case: Urgent Infection Present At Time Of Surgery (PATOS): Infected wound  Deward Foy, MD General, Bariatric, & Minimally Invasive Surgery [facility], [place]

## 2024-03-18 NOTE — Anesthesia Preprocedure Evaluation (Addendum)
 Anesthesia Evaluation  Patient identified by MRN, date of birth, ID band Patient awake    Reviewed: Allergy & Precautions, H&P , NPO status , Patient's Chart, lab work & pertinent test results  Airway Mallampati: III  TM Distance: >3 FB Neck ROM: Full    Dental no notable dental hx.    Pulmonary asthma , sleep apnea , former smoker   Pulmonary exam normal breath sounds clear to auscultation       Cardiovascular hypertension, Normal cardiovascular exam Rhythm:Regular Rate:Normal     Neuro/Psych  Headaches PSYCHIATRIC DISORDERS Anxiety Depression       GI/Hepatic negative GI ROS, Neg liver ROS,,,  Endo/Other  diabetes, Type 2  Class 4 obesity  Renal/GU negative Renal ROS  negative genitourinary   Musculoskeletal negative musculoskeletal ROS (+)    Abdominal   Peds negative pediatric ROS (+)  Hematology  (+) Blood dyscrasia, anemia   Anesthesia Other Findings   Reproductive/Obstetrics negative OB ROS                              Anesthesia Physical Anesthesia Plan  ASA: 4  Anesthesia Plan: General   Post-op Pain Management: Tylenol  PO (pre-op)*   Induction: Intravenous  PONV Risk Score and Plan: 3 and Ondansetron , Dexamethasone , Midazolam  and Treatment may vary due to age or medical condition  Airway Management Planned: Oral ETT  Additional Equipment: None  Intra-op Plan:   Post-operative Plan: Extubation in OR  Informed Consent: I have reviewed the patients History and Physical, chart, labs and discussed the procedure including the risks, benefits and alternatives for the proposed anesthesia with the patient or authorized representative who has indicated his/her understanding and acceptance.     Dental advisory given  Plan Discussed with: CRNA  Anesthesia Plan Comments:          Anesthesia Quick Evaluation

## 2024-03-18 NOTE — Anesthesia Procedure Notes (Signed)
 Procedure Name: Intubation Date/Time: 03/18/2024 11:47 AM  Performed by: Mannie Krystal LABOR, CRNAPre-anesthesia Checklist: Patient identified, Emergency Drugs available, Suction available and Patient being monitored Patient Re-evaluated:Patient Re-evaluated prior to induction Oxygen Delivery Method: Circle system utilized Preoxygenation: Pre-oxygenation with 100% oxygen Induction Type: IV induction Ventilation: Mask ventilation without difficulty and Oral airway inserted - appropriate to patient size Laryngoscope Size: Cleotilde and 2 Grade View: Grade I Tube type: Oral Tube size: 7.0 mm Number of attempts: 1 Airway Equipment and Method: Stylet and Oral airway Placement Confirmation: ETT inserted through vocal cords under direct vision, positive ETCO2 and breath sounds checked- equal and bilateral Secured at: 23 cm Tube secured with: Tape Dental Injury: Teeth and Oropharynx as per pre-operative assessment

## 2024-03-18 NOTE — Progress Notes (Addendum)
 [facility]   ROUNDING   NOTE GWENDOLIN BRIEL FMW:[id number]  DOB: 07/18/1981  DOA: 03/13/2024  PCP: Georgina Speaks, FNP  03/18/2024,3:39 PM  LOS: 5 days    Code Status:  full     From: Home current Dispo: Unclear    43 year old black female with poorly controlled DM ty ii Presumed OSA, OHSS BMI 51  8/10 developed abscess on buttock that enlarged from upper thigh noticed on 03/07/2024, saw PCP started on Keflex  but developed worsening pain no fever   on arrival found to have a very large abscess  an ED general surgery/ OB/GYN consulted as CT showed moderate volume air in subcutaneous tissue near left labia and perineum with innumerable liver lesions pulmonary nodules and splenomegaly severe sepsis on admission with 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  Hepatoma?  Needs liver protocol CT-1 cm subcapsular hyperintense lesion peripheral segment 7 indeterminant-mild splenomegaly with diffuse iron  deposition 8/13 underwent repeat I&D 8 x 8 cm 6 cm anteroposteriorly---  secure chat and messaged Dr. Loretha of oncology to set up close o  outpatient follow-up for characterization of liver lesions Maintained on initially broad-spectrum coverage narrowed to ceftriaxone -changed antibiotics given sensitivities as below 8/15 repeat debridement based on CT scan done on 8/14 showing deep fascial plane edema medial compartment with few small foci of air and concern for necrotizing fasciitis  Plan  Large labial abscess + strep anginosus left side status post multiple debridements above Strep anginosus with resistance --now ceftriaxone --> Unasyn  8/14 ongoing do not narrow -blood cultures no growth so far Further operative management as per general surgery--dressings to be delineated by general surgery-consulted wound care Ibuprofen  600 3 times daily  Percocet 7.5 every 4 as needed moderate pain needed Dilaudid  She will need close monitoring of her pain level Saline lock today  Liver lesions unknown primary and?  Hypercalcemia Peak calcium  was 12.1-currently is normalizing continue NS 75 cc/H Case discussed with Dr. Loretha oncology -workup can be completed as an outpatient as inappropriate for treatment with recent surgeries Will need CT protocol with washout to determine if lesion on liver is hepatoma needs close follow-up  History gestational diabetes Current diabetes mellitus TY 2 Holding Farxiga  Trulicity  going forward given risk of infection CBGs 82-203, continues Lantus  30 metformin  1.5 every morning as well as moderate and at bedtime coverage  Hypomagnesemia Improved since admission, give Mag-Ox 800 twice daily Given another 3 g and IV 8/15 and now normalized, periodic labs  Depression Continue Zoloft  75 daily resumed, continue propranolol  as needed at home  Discussed with significant other at the bedside  Data Reviewed:   Sodium 137 potassium 4.7 BUN/creatinine 11/0.6 magnesium  2.0 alk phos 364 AST/ALT 84/69 WBC 19.4 hemoglobin 9 platelet 458  DVT prophylaxis: SCD  Status is: Inpatient Remains inpatient appropriate because:   Requires further operative management     Subjective:  Seen after surgery performed was in severe pain-received Dilaudid  and looks okay now Has not eaten yet No fever no chills currently No chest pain no fever    Objective + exam Vitals:   03/18/24 0917 03/18/24 1235 03/18/24 1245 03/18/24 1300  BP:  (!) 148/85 (!) 157/99 (!) 139/90  Pulse: (P) 84 (!) 109 (!) 113 (!) 109  Resp: (P) 16 20 (!) 21 20  Temp: (P) 98.2 F (36.8 C) 99.3 F (37.4 C)  98.9 F (37.2 C)  TempSrc: (P) Oral     SpO2: (P) 97% 100% 94% 93%  Weight: (!) (P) 139.7 kg     Height: (P) 5' 5 (1.651 m)      Filed Weights   03/14/24 0131 03/15/24 0500 03/18/24 0917  Weight: (!) 137.2 kg (!) 140.1 kg (!) (P)  139.7 kg     Examination:  EOMI NCAT no focal deficit no icterus no pallor no wheeze no rales no rhonchi thick neck Mallampati 4 no icterus S1-S2 no murmur RRR Mallampati 4 ROM is intact moving 4 limbs equally  induration remains in lower mons area on exam I did not examine the wound per se   Scheduled Meds:  fluticasone   1-2 spray Each Nare Daily    HYDROmorphone  (DILAUDID ) injection  0.5 mg Intravenous Once   ibuprofen   600 mg Oral TID WC   insulin  aspart  0-15 Units Subcutaneous TID WC   insulin  aspart  0-5 Units Subcutaneous QHS   insulin  glargine-yfgn  30 Units Subcutaneous Daily   loratadine   10 mg Oral Daily   magnesium  oxide  800 mg Oral BID   metFORMIN   1,500 mg Oral Q breakfast   pantoprazole   40 mg Oral Daily   sertraline   75 mg Oral Daily   Continuous Infusions:  sodium chloride  75 mL/hr at 03/17/24 2324   ampicillin -sulbactam (UNASYN ) IV 3 g (03/18/24 0421)    Time 34  Colen Grimes, MD  [facility]

## 2024-03-18 NOTE — Transfer of Care (Signed)
 Immediate Anesthesia Transfer of Care Note  Patient: [name]  Procedure(s) Performed: ERASMO UNDER ANESTHESIA; INCISION, DRAINAGE, AND DEBRIDEMENT OF NECROTIZING SOFT TISSUE INFECTION LEFT LABIA AND THIGH (Left: Groin)  Patient Location: PACU  Anesthesia Type:General  Level of Consciousness: awake, alert , and oriented  Airway & Oxygen Therapy: Patient Spontanous Breathing and Patient connected to face mask oxygen  Post-op Assessment: Report given to RN and Post -op Vital signs reviewed and stable  Post vital signs: Reviewed and stable  Last Vitals:  Vitals Value Taken Time  BP 148/85 03/18/24 12:35  Temp    Pulse 112 03/18/24 12:38  Resp 31 03/18/24 12:38  SpO2 100 % 03/18/24 12:38  Vitals shown include unfiled device data.  Last Pain:  Vitals:   03/18/24 0917  TempSrc: (P) Oral  PainSc:       Patients Stated Pain Goal: 2 (03/16/24 0756)  Complications: No notable events documented.

## 2024-03-18 NOTE — Anesthesia Postprocedure Evaluation (Signed)
 Anesthesia Post Note  Patient: [name]  Procedure(s) Performed: EXAM UNDER ANESTHESIA; INCISION, DRAINAGE, AND DEBRIDEMENT OF NECROTIZING SOFT TISSUE INFECTION LEFT LABIA AND THIGH (Left: Groin)     Patient location during evaluation: PACU Anesthesia Type: General Level of consciousness: awake and alert Pain management: pain level controlled Vital Signs Assessment: post-procedure vital signs reviewed and stable Respiratory status: spontaneous breathing, nonlabored ventilation, respiratory function stable and patient connected to nasal cannula oxygen Cardiovascular status: blood pressure returned to baseline and stable Postop Assessment: no apparent nausea or vomiting Anesthetic complications: no   No notable events documented.  Last Vitals:  Vitals:   03/18/24 1245 03/18/24 1300  BP: (!) 157/99 (!) 139/90  Pulse: (!) 113 (!) 109  Resp: (!) 21 20  Temp:  37.2 C  SpO2: 94% 93%    Last Pain:  Vitals:   03/18/24 1431  TempSrc:   PainSc: 10-Worst pain ever                 Thom JONELLE Peoples

## 2024-03-19 ENCOUNTER — Encounter (HOSPITAL_COMMUNITY): Payer: Self-pay | Admitting: Surgery

## 2024-03-19 ENCOUNTER — Encounter (HOSPITAL_COMMUNITY)
Admission: AD | Disposition: A | Discharge status: Home Health | Payer: Self-pay | Source: Home / Self Care | Attending: Family Medicine

## 2024-03-19 DIAGNOSIS — M726 Necrotizing fasciitis: Secondary | ICD-10-CM | POA: Diagnosis not present

## 2024-03-19 HISTORY — PX: WOUND EXPLORATION: SHX6188

## 2024-03-19 LAB — CBC WITH DIFFERENTIAL/PLATELET
Abs Immature Granulocytes: 0.81 K/uL — ABNORMAL HIGH (ref 0.00–0.07)
Abs Immature Granulocytes: 0.61 K/uL — ABNORMAL HIGH (ref 0.00–0.07)
Basophils Absolute: 0.1 K/uL (ref 0.0–0.1)
Basophils Absolute: 0 K/uL (ref 0.0–0.1)
Basophils Relative: 0 %
Basophils Relative: 0 %
Eosinophils Absolute: 0 K/uL (ref 0.0–0.5)
Eosinophils Absolute: 0.1 K/uL (ref 0.0–0.5)
Eosinophils Relative: 0 %
Eosinophils Relative: 1 %
HCT: 30.6 % — ABNORMAL LOW (ref 36.0–46.0)
HCT: 24.5 % — ABNORMAL LOW (ref 36.0–46.0)
Hemoglobin: 9.4 g/dL — ABNORMAL LOW (ref 12.0–15.0)
Hemoglobin: 7.7 g/dL — ABNORMAL LOW (ref 12.0–15.0)
Immature Granulocytes: 5 %
Immature Granulocytes: 4 %
Lymphocytes Relative: 5 %
Lymphocytes Relative: 6 %
Lymphs Abs: 0.8 K/uL (ref 0.7–4.0)
Lymphs Abs: 1 K/uL (ref 0.7–4.0)
MCH: 25.8 pg — ABNORMAL LOW (ref 26.0–34.0)
MCH: 26.1 pg (ref 26.0–34.0)
MCHC: 30.7 g/dL (ref 30.0–36.0)
MCHC: 31.4 g/dL (ref 30.0–36.0)
MCV: 84.1 fL (ref 80.0–100.0)
MCV: 83.1 fL (ref 80.0–100.0)
Monocytes Absolute: 0.9 K/uL (ref 0.1–1.0)
Monocytes Absolute: 1.2 K/uL — ABNORMAL HIGH (ref 0.1–1.0)
Monocytes Relative: 5 %
Monocytes Relative: 8 %
Neutro Abs: 14.1 K/uL — ABNORMAL HIGH (ref 1.7–7.7)
Neutro Abs: 13.2 K/uL — ABNORMAL HIGH (ref 1.7–7.7)
Neutrophils Relative %: 85 %
Neutrophils Relative %: 81 %
Platelets: 497 K/uL — ABNORMAL HIGH (ref 150–400)
Platelets: 427 K/uL — ABNORMAL HIGH (ref 150–400)
RBC: 3.64 MIL/uL — ABNORMAL LOW (ref 3.87–5.11)
RBC: 2.95 MIL/uL — ABNORMAL LOW (ref 3.87–5.11)
RDW: 17.3 % — ABNORMAL HIGH (ref 11.5–15.5)
RDW: 17.2 % — ABNORMAL HIGH (ref 11.5–15.5)
WBC: 16.7 K/uL — ABNORMAL HIGH (ref 4.0–10.5)
WBC: 16.2 K/uL — ABNORMAL HIGH (ref 4.0–10.5)
nRBC: 0.2 % (ref 0.0–0.2)
nRBC: 0.4 % — ABNORMAL HIGH (ref 0.0–0.2)

## 2024-03-19 LAB — COMPREHENSIVE METABOLIC PANEL WITH GFR
ALT: 59 U/L — ABNORMAL HIGH (ref 0–44)
AST: 37 U/L (ref 15–41)
Albumin: 2 g/dL — ABNORMAL LOW (ref 3.5–5.0)
Alkaline Phosphatase: 351 U/L — ABNORMAL HIGH (ref 38–126)
Anion gap: 8 (ref 5–15)
BUN: 17 mg/dL (ref 6–20)
CO2: 26 mmol/L (ref 22–32)
Calcium: 10.3 mg/dL (ref 8.9–10.3)
Chloride: 102 mmol/L (ref 98–111)
Creatinine, Ser: 0.82 mg/dL (ref 0.44–1.00)
GFR, Estimated: >60 mL/min (ref >60)
Glucose, Bld: 300 mg/dL — ABNORMAL HIGH (ref 70–99)
Potassium: 5 mmol/L (ref 3.5–5.1)
Sodium: 136 mmol/L (ref 135–145)
Total Bilirubin: 0.7 mg/dL (ref 0.0–1.2)
Total Protein: 6.9 g/dL (ref 6.5–8.1)

## 2024-03-19 LAB — GLUCOSE, CAPILLARY
Glucose-Capillary: 143 mg/dL — ABNORMAL HIGH (ref 70–99)
Glucose-Capillary: 233 mg/dL — ABNORMAL HIGH (ref 70–99)
Glucose-Capillary: 241 mg/dL — ABNORMAL HIGH (ref 70–99)
Glucose-Capillary: 248 mg/dL — ABNORMAL HIGH (ref 70–99)
Glucose-Capillary: 250 mg/dL — ABNORMAL HIGH (ref 70–99)

## 2024-03-19 LAB — PREPARE RBC (CROSSMATCH)

## 2024-03-19 LAB — MAGNESIUM: Magnesium: 1.9 mg/dL (ref 1.7–2.4)

## 2024-03-19 SURGERY — WOUND EXPLORATION
Anesthesia: General | Site: Groin | Laterality: Left

## 2024-03-19 MED ORDER — AMLODIPINE BESYLATE 5 MG PO TABS
5.0000 mg | ORAL_TABLET | Freq: Every day | ORAL | Status: DC
  Filled 2024-03-19: qty 1

## 2024-03-19 MED ORDER — LACTATED RINGERS IV SOLN: INTRAVENOUS | Status: DC | PRN

## 2024-03-19 MED ORDER — SODIUM CHLORIDE 0.9 % IV BOLUS
500.0000 mL | Freq: Once | INTRAVENOUS | Status: AC
  Administered 2024-03-19: 500 mL via INTRAVENOUS

## 2024-03-19 MED ORDER — INSULIN GLARGINE-YFGN 100 UNIT/ML ~~LOC~~ SOLN
35.0000 [IU] | Freq: Every day | SUBCUTANEOUS | Status: DC
  Administered 2024-03-20: 35 [IU] via SUBCUTANEOUS
  Filled 2024-03-19: qty 0.35

## 2024-03-19 MED ORDER — SODIUM CHLORIDE 0.9% IV SOLUTION: Freq: Once | INTRAVENOUS | Status: DC

## 2024-03-19 MED ORDER — FLUCONAZOLE 150 MG PO TABS
150.0000 mg | ORAL_TABLET | Freq: Once | ORAL | Status: AC
  Administered 2024-03-19: 150 mg via ORAL
  Filled 2024-03-19: qty 1

## 2024-03-19 MED ORDER — CHLORHEXIDINE GLUCONATE 4 % EX SOLN: 1.0000 | Freq: Once | CUTANEOUS | Status: DC

## 2024-03-19 MED ORDER — DIPHENHYDRAMINE HCL 25 MG PO CAPS
25.0000 mg | ORAL_CAPSULE | Freq: Once | ORAL | Status: AC
  Administered 2024-03-19: 25 mg via ORAL
  Filled 2024-03-19: qty 1

## 2024-03-19 MED ORDER — SODIUM CHLORIDE 0.9 % IV SOLN: INTRAVENOUS | Status: AC

## 2024-03-19 MED ORDER — HYDROMORPHONE HCL 1 MG/ML IJ SOLN
1.0000 mg | Freq: Once | INTRAMUSCULAR | Status: AC
  Administered 2024-03-19: 1 mg via INTRAVENOUS
  Filled 2024-03-19: qty 1

## 2024-03-19 MED ORDER — FENTANYL CITRATE (PF) 250 MCG/5ML IJ SOLN
INTRAMUSCULAR | Status: AC
  Filled 2024-03-19: qty 5

## 2024-03-19 MED ORDER — MIDAZOLAM HCL 2 MG/2ML IJ SOLN
INTRAMUSCULAR | Status: AC
  Filled 2024-03-19: qty 2

## 2024-03-19 MED ORDER — ACETAMINOPHEN 325 MG PO TABS
650.0000 mg | ORAL_TABLET | Freq: Once | ORAL | Status: AC
  Administered 2024-03-19: 650 mg via ORAL
  Filled 2024-03-19: qty 2

## 2024-03-19 SURGICAL SUPPLY — 30 items
BAG COUNTER SPONGE SURGICOUNT (BAG) ×1 IMPLANT
BLADE SURG 11 STRL SS (BLADE) ×1 IMPLANT
BNDG GAUZE DERMACEA FLUFF 4 (GAUZE/BANDAGES/DRESSINGS) IMPLANT
COVER MAYO STAND STRL (DRAPES) ×1 IMPLANT
COVER SURGICAL LIGHT HANDLE (MISCELLANEOUS) ×1 IMPLANT
ELECT CAUTERY BLADE 6.4 (BLADE) ×1 IMPLANT
ELECTRODE REM PT RTRN 9FT ADLT (ELECTROSURGICAL) ×1 IMPLANT
GAUZE PAD ABD 8X10 STRL (GAUZE/BANDAGES/DRESSINGS) IMPLANT
GAUZE SPONGE 4X4 12PLY STRL (GAUZE/BANDAGES/DRESSINGS) IMPLANT
GLOVE BIOGEL PI IND STRL 6 (GLOVE) ×1 IMPLANT
GLOVE BIOGEL PI MICRO STRL 5.5 (GLOVE) ×1 IMPLANT
GOWN STRL REUS W/ TWL LRG LVL3 (GOWN DISPOSABLE) ×2 IMPLANT
KIT BASIN OR (CUSTOM PROCEDURE TRAY) ×1 IMPLANT
KIT TURNOVER KIT B (KITS) ×1 IMPLANT
NS IRRIG 1000ML POUR BTL (IV SOLUTION) ×1 IMPLANT
PACK GENERAL/GYN (CUSTOM PROCEDURE TRAY) IMPLANT
PACK LITHOTOMY IV (CUSTOM PROCEDURE TRAY) IMPLANT
PAD ABD 8X10 STRL (GAUZE/BANDAGES/DRESSINGS) IMPLANT
PAD ARMBOARD POSITIONER FOAM (MISCELLANEOUS) ×1 IMPLANT
PENCIL SMOKE EVACUATOR (MISCELLANEOUS) ×1 IMPLANT
SPONGE T-LAP 18X18 ~~LOC~~+RFID (SPONGE) IMPLANT
SURGILUBE 2OZ TUBE FLIPTOP (MISCELLANEOUS) ×1 IMPLANT
SWAB COLLECTION DEVICE MRSA (MISCELLANEOUS) IMPLANT
SWAB CULTURE ESWAB REG 1ML (MISCELLANEOUS) IMPLANT
SYR BULB EAR ULCER 3OZ GRN STR (SYRINGE) ×1 IMPLANT
SYR BULB IRRIG 60ML STRL (SYRINGE) IMPLANT
TOWEL GREEN STERILE (TOWEL DISPOSABLE) ×1 IMPLANT
TOWEL GREEN STERILE FF (TOWEL DISPOSABLE) ×1 IMPLANT
TUBE CONNECTING 12X1/4 (SUCTIONS) ×1 IMPLANT
YANKAUER SUCT BULB TIP NO VENT (SUCTIONS) ×1 IMPLANT

## 2024-03-19 NOTE — Plan of Care (Signed)

## 2024-03-19 NOTE — Progress Notes (Signed)
 1 Day Post-Op  Subjective: Doing well this AM   Objective: Vital signs in last 24 hours: Temp:  [97.7 F (36.5 C)-99.3 F (37.4 C)] 98.6 F (37 C) (08/16 0752) Pulse Rate:  [51-113] 51 (08/16 0752) Resp:  [18-21] 18 (08/16 0752) BP: (139-173)/(83-108) 155/91 (08/16 0752) SpO2:  [93 %-100 %] 99 % (08/16 0752) Last BM Date : 03/17/24  Intake/Output from previous day: 08/15 0701 - 08/16 0700 In: -  Out: 200 [Urine:200] Intake/Output this shift: No intake/output data recorded.  PE: Gen:  Alert, NAD, pleasant GU: Left open labial/thigh wound dressed  Lab Results:  Recent Labs    03/18/24 1414 03/19/24 0504  WBC 19.4* 16.7*  HGB 9.0* 9.4*  HCT 28.6* 30.6*  PLT 458* 497*   BMET Recent Labs    03/18/24 1414 03/19/24 0504  NA 137 136  K 4.7 5.0  CL 106 102  CO2 25 26  GLUCOSE 124* 300*  BUN 11 17  CREATININE 0.67 0.82  CALCIUM  9.5 10.3   PT/INR No results for input(s): LABPROT, INR in the last 72 hours. CMP     Component Value Date/Time   NA 136 03/19/2024 0504   NA 136 03/10/2024 1458   K 5.0 03/19/2024 0504   CL 102 03/19/2024 0504   CO2 26 03/19/2024 0504   GLUCOSE 300 (H) 03/19/2024 0504   BUN 17 03/19/2024 0504   BUN 33 (H) 03/10/2024 1458   CREATININE 0.82 03/19/2024 0504   CALCIUM  10.3 03/19/2024 0504   CALCIUM  10.4 (H) 03/15/2024 1137   PROT 6.9 03/19/2024 0504   PROT 7.5 11/02/2023 0933   ALBUMIN  2.0 (L) 03/19/2024 0504   ALBUMIN  3.8 (L) 11/02/2023 0933   AST 37 03/19/2024 0504   ALT 59 (H) 03/19/2024 0504   ALKPHOS 351 (H) 03/19/2024 0504   BILITOT 0.7 03/19/2024 0504   BILITOT 0.5 11/02/2023 0933   GFRNONAA >60 03/19/2024 0504   GFRAA 108 08/28/2020 1735   Lipase     Component Value Date/Time   LIPASE 46 05/15/2021 1058    Studies/Results: CT PELVIS W CONTRAST Result Date: 03/17/2024 CLINICAL DATA:  Necrotizing fasciitis suspected EXAM: CT PELVIS WITH CONTRAST TECHNIQUE: Multidetector CT imaging of the pelvis was  performed using the standard protocol following the bolus administration of intravenous contrast. RADIATION DOSE REDUCTION: This exam was performed according to the departmental dose-optimization program which includes automated exposure control, adjustment of the mA and/or kV according to patient size and/or use of iterative reconstruction technique. CONTRAST:  75mL OMNIPAQUE  IOHEXOL  350 MG/ML SOLN COMPARISON:  CT abdomen pelvis March 13, 2024. FINDINGS: Urinary Tract:  Bladder is unremarkable. Bowel: Necrotizing fascitis/abscess formation centered within the left perineum with interval decrease in fluid component status post incision and drainage. There is persistent tract of air extending from the perianal region anteriorly to the left labia major and mons pubis with a large collection of subcutaneous air (post incision and drainage), now more confluent measuring 11 x 2.6 cm (3/138). Additional punctate foci of air extend to the left inguinal region (3/95).Compared to prior exam the fluid collection is significantly decreased common near completely resolved. Persistent diffuse subcutaneous fat stranding. Large stool throughout the colon. Visualized bowel loops are otherwise within normal limits. No bowel obstruction. Vascular/Lymphatic: Multiple prominent enhancing bilateral inguinal lymphadenopathy left greater than right likely reactive. Reproductive: Fibroid uterus. The left adnexal enhancing lesion measuring 2.8 by 3.7 cm containing a ring calcification measuring 17 mm may represent a pedunculated calcified fibroid versus an  ovarian lesion. IUD in proper position in endometrial cavity. Other: Fat containing umbilical/paraumbilical multiloculated hernia. Partially visualized nodular liver Musculoskeletal: No suspicious osseous lesion IMPRESSION: Perineal/left labia major subcutaneous abscess formation with interval near complete resolution of fluid collection, status post incision and drainage. Residual  subcutaneous emphysematous changes and fat stranding as detail above. Bilateral inguinal enhancing lymphadenopathy likely reactive. Electronically Signed   By: Megan  Zare M.D.   On: 03/17/2024 18:29   CT FEMUR LEFT W CONTRAST Addendum Date: 03/17/2024 ADDENDUM REPORT: 03/17/2024 18:27 ADDENDUM: These results were called by telephone at the time of interpretation on 03/17/2024 at 6:27 pm to provider Dr. Royal, who verbally acknowledged these results. Electronically Signed   By: Greig Pique M.D.   On: 03/17/2024 18:27   Result Date: 03/17/2024 CLINICAL DATA:  Necrotizing fasciitis suspected. EXAM: CT OF THE LOWER LEFT EXTREMITY WITH CONTRAST TECHNIQUE: Multidetector CT imaging of the lower left extremity was performed according to the standard protocol following intravenous contrast administration. RADIATION DOSE REDUCTION: This exam was performed according to the departmental dose-optimization program which includes automated exposure control, adjustment of the mA and/or kV according to patient size and/or use of iterative reconstruction technique. CONTRAST:  75mL OMNIPAQUE  IOHEXOL  350 MG/ML SOLN COMPARISON:  None Available. FINDINGS: Bones/Joint/Cartilage There is no acute fracture or dislocation. No focal osseous lesion or cortical erosion identified. No joint effusion identified of the left hip or left knee. Ligaments Suboptimally assessed by CT. Muscles and Tendons There is some deep fascial plane edema of the medial compartment of the upper left thigh with a few small foci of air best seen on image 4/102. No focal hematoma or fluid collection. No foreign body. Soft tissues There is soft tissue ulceration in the inferior left perineum involving subcutaneous tissues. There is surrounding subcutaneous edema and air without focal fluid collection identified. There is also subcutaneous edema and swelling of the left thigh with medial left thigh skin thickening. There is also some subcutaneous edema of the  mons pubis. Small amount of air seen within the subcutaneous tissues of the left inguinal region as well. There are prominent bilateral inguinal lymph nodes. IUD is noted in the uterus. IMPRESSION: 1. Deep fascial plane edema of the medial compartment of the upper left thigh with a few small foci of air. Findings are concerning for necrotizing fasciitis. 2. Soft tissue ulceration in the inferior left perineum. Subcutaneous edema and swelling of the proximal left thigh and mons pubis with associated scattered subcutaneous foci of air worrisome for cellulitis. 3. No acute osseous abnormality. Electronically Signed: By: Greig Pique M.D. On: 03/17/2024 18:13    Anti-infectives: Anti-infectives (From admission, onward)    Start     Dose/Rate Route Frequency Ordered Stop   03/17/24 1600  Ampicillin -Sulbactam (UNASYN ) 3 g in sodium chloride  0.9 % 100 mL IVPB        3 g 200 mL/hr over 30 Minutes Intravenous Every 6 hours 03/17/24 1456     03/16/24 1215  cefTRIAXone  (ROCEPHIN ) 2 g in sodium chloride  0.9 % 100 mL IVPB  Status:  Discontinued        2 g 200 mL/hr over 30 Minutes Intravenous Daily 03/16/24 1124 03/17/24 1456   03/14/24 1000  meropenem  (MERREM ) 1 g in sodium chloride  0.9 % 100 mL IVPB  Status:  Discontinued        1 g 33.3 mL/hr over 180 Minutes Intravenous Every 8 hours 03/13/24 2140 03/16/24 1124   03/13/24 2200  linezolid  (ZYVOX ) IVPB 600 mg  Status:  Discontinued        600 mg 300 mL/hr over 60 Minutes Intravenous Every 12 hours 03/13/24 2055 03/16/24 1124   03/13/24 2130  meropenem  (MERREM ) 1 g in sodium chloride  0.9 % 100 mL IVPB        1 g 33.3 mL/hr over 180 Minutes Intravenous  Once 03/13/24 2055 03/14/24 0455   03/13/24 1900  clindamycin  (CLEOCIN ) IVPB 600 mg        600 mg 100 mL/hr over 30 Minutes Intravenous  Once 03/13/24 1859 03/14/24 1608   03/13/24 1730  vancomycin  (VANCOCIN ) IVPB 1000 mg/200 mL premix       Placed in Followed by Linked Group   1,000 mg 200 mL/hr  over 60 Minutes Intravenous  Once 03/13/24 1625 03/13/24 2010   03/13/24 1730  ceFEPIme  (MAXIPIME ) 2 g in sodium chloride  0.9 % 100 mL IVPB        2 g 200 mL/hr over 30 Minutes Intravenous  Once 03/13/24 1728 03/13/24 1835   03/13/24 1630  vancomycin  (VANCOCIN ) IVPB 1000 mg/200 mL premix       Placed in Followed by Linked Group   1,000 mg 200 mL/hr over 60 Minutes Intravenous  Once 03/13/24 1625 03/13/24 1751        Assessment/Plan POD 5/3/1 s/p excisional debridement of NSTI of left labia/perineum - Additional abscess cavity opened in OR 8/15, confident infection is under control at this point - Dressing changes per nursing staff this weekend, if any concerns please call our team to assess - We will re-assess wound at dressing change on Monday and at that point, hopefully we will be working on setting up a safe discharge home with home health.     - per [facility] -  Morbid obesity - BMI 50.33 T2DM - A1c on 8/7 8.8 HTN Mild intermittent asthma Liver masses and pulm nodules on CT  I reviewed nursing notes, hospitalist notes, last 24 h vitals and pain scores, last 48 h intake and output, last 24 h labs and trends, and last 24 h imaging results.   LOS: 6 days    Deward JINNY Foy, MD [facility] 03/19/2024, 9:30 AM Please see Amion for pager number during day hours 7:00am-4:30pm

## 2024-03-19 NOTE — Progress Notes (Signed)
 BP is low and hgb has dropped.  Rapid Response called.  See MD orders,.

## 2024-03-19 NOTE — Progress Notes (Signed)
 Family came out to nursing station saying that pt was bleeding.  Upon entering room, blood noted on abdomen and gown.  Groin visualized and large amount of bright red blood and clots noted.  Dr. Royal notified via secure chat.  MD wants surgeon to also come see pt.

## 2024-03-19 NOTE — Progress Notes (Signed)
 Evalene Sprinkles, MD notified about patient blood pressure 173/108 with no PRN orders for treatment of hypertension. Provider stated, We don't need to give an antihypertensive unless she's symptomatic. If it's elevated from pain, we should treat the pain.   Orders are being followed for patients pain management.

## 2024-03-19 NOTE — Progress Notes (Signed)
 After getting put up to chair, she states she feels dizzy and is seeing purple spots.  BP down to 106/93.

## 2024-03-19 NOTE — Progress Notes (Addendum)
 [facility]   ROUNDING   NOTE [name] FMW:[id number]  DOB: [birth year]  DOA: 03/13/2024  PCP: Georgina Speaks, FNP  03/19/2024,5:23 PM  LOS: 6 days    Code Status:  full     From: Home current Dispo: Unclear    43 year old black female with poorly controlled DM ty ii Presumed OSA, OHSS BMI 51  8/10 developed abscess on buttock that enlarged from upper thigh noticed on 03/07/2024, saw PCP started on Keflex  but developed worsening pain no fever   on arrival found to have a very large abscess  an ED general surgery/ OB/GYN consulted as CT showed moderate volume air in subcutaneous tissue near left labia and perineum with innumerable liver lesions pulmonary nodules and splenomegaly severe sepsis on admission with 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  Hepatoma?  Needs liver protocol CT-1 cm subcapsular hyperintense lesion peripheral segment 7 indeterminant-mild splenomegaly with diffuse iron  deposition 8/13 underwent repeat I&D 8 x 8 cm 6 cm anteroposteriorly---  secure chat and messaged Dr. Loretha of oncology to set up close outpatient follow-up for characterization of liver lesions Maintained on initially broad-spectrum coverage narrowed to ceftriaxone -changed antibiotics given sensitivities as below 8/15 repeat debridement based on CT scan done on 8/14 showing deep fascial plane edema medial compartment with few small foci of air and concern for necrotizing fasciitis 8/16 mod volume bleed from wound site managed bedsdie by Gen surg--tx to SDU  Plan  Hypovolemic shock like symptoms secondary to blood loss from wound-managed at bedside by general surgery Manual blood pressure 90s over 50s with MAP below 65-bolus of saline 500 cc-2 point drop in hemoglobin Transfuse 1 unit PRBC stat with 2 units ahead-- INR in the morning as was  slightly elevated prior Resume saline 100 cc/H posttransfusion General Surgery aware of patient  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  Hypercalcemia Peak calcium  was 12.1-stabilizing Case discussed with Dr. Loretha oncology -workup can be completed as an outpatient as inappropriate for treatment with recent surgeries-her office is aware to set up appointment  History gestational diabetes Current diabetes mellitus TY 2 Holding Farxiga  Trulicity  going forward given risk of infection CBG 241-300, increase Lantus  35 metformin  1.5 every morning as well as moderate and at bedtime coverage  History of hypertension Was on ARB before and we changed it to amlodipine  this morning which have stopped because of hypotension this afternoon  Hypomagnesemia Improved since admission, give Mag-Ox 800 twice daily--iv replacement stopped  Depression Continue Zoloft  75 daily resumed, continue propranolol  as needed at home  Discussed with significant other at the bedside  Data Reviewed:   Sodium 136 potassium 5.0 BUN/creatinine 17/0.8 Alk phos 351 AST/ALT 37/59 Hemoglobin down from9.4-->1.7, platelet 427  DVT prophylaxis: SCD  Status is: Inpatient Remains inpatient appropriate because:   Requires further operative management     Subjective:  Called urgently to bedside this afternoon as per general surgery's note They were packing the wound and cleaning it up with copious blood clot. She felt a little lightheaded after being told to sit for some time on a hard  surface to tamponade the area and this resulted in her transporting to progressive in  case she bleeds again    Objective + exam Vitals:   03/18/24 1948 03/19/24 0354 03/19/24 0752 03/19/24 1228  BP: (!) 173/108 (!) 158/83 (!) 155/91 (!) 149/90  Pulse: 72 63 (!) 51 70  Resp: 18 18 18 18   Temp: 98 F (36.7 C) 97.7 F (36.5 C) 98.6 F (37 C) 98.9 F (37.2 C)  TempSrc: Oral Oral Oral Oral  SpO2: 93% 98% 99% 98%  Weight:      Height:       Filed Weights   03/14/24 0131 03/15/24 0500 03/18/24 0917  Weight: (!) 137.2 kg (!) 140.1 kg (!) 139.7 kg     Examination:  EOMI NCAT no focal deficit no icterus no pallor no wheeze no rales no rhonchi thick neck Mallampati 4 no icterus S1-S2 no murmur RRR Mallampati 4 ROM is intact moving 4 limbs equally Wound examined with surgery earlier today   Scheduled Meds:  sodium chloride    Intravenous Once   fluticasone   1-2 spray Each Nare Daily    HYDROmorphone  (DILAUDID ) injection  0.5 mg Intravenous Once   ibuprofen   600 mg Oral TID WC   insulin  aspart  0-15 Units Subcutaneous TID WC   insulin  aspart  0-5 Units Subcutaneous QHS   [START ON 03/20/2024] insulin  glargine-yfgn  35 Units Subcutaneous Daily   loratadine   10 mg Oral Daily   magnesium  oxide  800 mg Oral BID   metFORMIN   1,500 mg Oral Q breakfast   pantoprazole   40 mg Oral Daily   sertraline   75 mg Oral Daily   Continuous Infusions:  sodium chloride  10 mL/hr at 03/18/24 1606   sodium chloride      ampicillin -sulbactam (UNASYN ) IV 3 g (03/19/24 1250)    Time 44  CRITICAL CARE Performed by: Jai-Gurmukh Dannel Rafter   Total critical care time: 20 minutes  Critical care time was exclusive of separately billable procedures and treating other patients.  Critical care was necessary to treat or prevent imminent or life-threatening deterioration.  Critical care was time spent personally by me on the following activities: development of treatment plan with patient and/or surrogate as well as nursing, discussions with consultants, evaluation of patient's response to  treatment, examination of patient, obtaining history from patient or surrogate, ordering and performing treatments and interventions, ordering and review of laboratory studies, ordering and review of radiographic studies, pulse oximetry and re-evaluation of patient's condition.   Jai-Gurmukh Ryver Poblete, MD  [facility]

## 2024-03-19 NOTE — Progress Notes (Signed)
 Both Mds in room at this time.  Packing has been removed by Dr. Isla and suctioning used to collect blood.  Large clots also noted.  Incision site repacked by MD and he gave verbal order to sit pt up in chair for pressure.

## 2024-03-19 NOTE — Significant Event (Signed)
 Rapid Response Event Note   Reason for Call :  Hypotension and active bleeding Per RN bleeding started about 3pm.    Initial Focused Assessment:  The acute bleeding has stopped.  The bandage is blood soaked but a clot has developed on the site. Dr Samtani and Dr Lyndel at bedside.  She is currently lying in bed.  She is alert and oriented.  She is talking to family on the phone.  Lying in her side, helping to maintain pressure on site.  BP 113/51  HR 93  O2 sat 100% on RA   Interventions:  1 L NS infused Lab at bedside to draw type and screen  Transfer to PCU  Plan of Care:  RN to call if bleeding starts again   Event Summary:   MD Notified: Dr Royal and Dr Lyndel at bedside Call Time: 1725 Arrival Time: 1727 End Time: RENARD Elvin Portland, RN

## 2024-03-19 NOTE — Progress Notes (Signed)
 Bled from wound after dressing change.  Cleared clot from wound, repacked wound with two dry kerlex rolls.  Patient wants to go to the bathroom, but after, I want her to sit upright in a chair to put pressure on the packed wound.  Will re-evaluate later today.  Deward JINNY Foy, MD General, Bariatric and Minimally Invasive Surgery [facility]

## 2024-03-19 NOTE — Progress Notes (Signed)
 I was notified by bedside RN that patient has had continued bleeding this evening from her wound and saturated her dressings with frank blood. She has also had soft BP, with systolic BP now in the 80s. She has tachycardic to 120, and endorses some light-headedness. 1u PRBCs is currently transfusing. On exam of her wound, on removing all the dressings there is a very large amount of clot in the wound, and medially there is active bleeding from the wound, some of which is bright red. Very difficult to fully examine the wound at bedside and identify the precise source of bleeding. As she has had continued bleeding throughout the day, despite packing and pressure, and now has relative hypotension, I recommended wound exploration in the OR to obtain control of bleeding. Patient posted emergently for surgery. I reviewed the details of the planned procedure with the patient, and she consents to proceed. All questions were answered. Wound was repacked with Quick-clot.  Leonor Dawn, MD [facility] General, Hepatobiliary and Pancreatic Surgery 03/19/24 11:19 PM

## 2024-03-19 NOTE — Anesthesia Preprocedure Evaluation (Signed)
 Anesthesia Evaluation  Patient identified by MRN, date of birth, ID band Patient awake    Reviewed: Allergy & Precautions, H&P , NPO status , Patient's Chart, lab work & pertinent test results  Airway Mallampati: II   Neck ROM: full    Dental   Pulmonary asthma , sleep apnea , former smoker   breath sounds clear to auscultation       Cardiovascular hypertension,  Rhythm:regular Rate:Normal     Neuro/Psych  Headaches PSYCHIATRIC DISORDERS Anxiety Depression       GI/Hepatic   Endo/Other  diabetes, Type 2  Class 4 obesity  Renal/GU      Musculoskeletal   Abdominal   Peds  Hematology  (+) Blood dyscrasia, anemia   Anesthesia Other Findings   Reproductive/Obstetrics                              Anesthesia Physical Anesthesia Plan  ASA: 3 and emergent  Anesthesia Plan: General   Post-op Pain Management:    Induction: Intravenous, Rapid sequence and Cricoid pressure planned  PONV Risk Score and Plan: 3 and Ondansetron , Dexamethasone , Midazolam  and Treatment may vary due to age or medical condition  Airway Management Planned: Oral ETT  Additional Equipment:   Intra-op Plan:   Post-operative Plan: Extubation in OR  Informed Consent: I have reviewed the patients History and Physical, chart, labs and discussed the procedure including the risks, benefits and alternatives for the proposed anesthesia with the patient or authorized representative who has indicated his/her understanding and acceptance.     Dental advisory given  Plan Discussed with: CRNA, Anesthesiologist and Surgeon  Anesthesia Plan Comments:         Anesthesia Quick Evaluation

## 2024-03-19 NOTE — Progress Notes (Signed)
 Report called to 4E.  All questions answered.

## 2024-03-19 NOTE — Progress Notes (Signed)
 Pt back to bed after eating supper.  States she still feels dizzy and now sleepy.  BP will be rechecked.  MD in room at this time to check on pt.

## 2024-03-19 NOTE — Progress Notes (Signed)
 Dr. Isla notified via secure chat of pt's bleeding.

## 2024-03-20 ENCOUNTER — Inpatient Hospital Stay (HOSPITAL_COMMUNITY): Admitting: Anesthesiology

## 2024-03-20 ENCOUNTER — Other Ambulatory Visit: Payer: Self-pay

## 2024-03-20 DIAGNOSIS — M726 Necrotizing fasciitis: Secondary | ICD-10-CM | POA: Diagnosis not present

## 2024-03-20 LAB — CBC WITH DIFFERENTIAL/PLATELET
Abs Immature Granulocytes: 1.71 K/uL — ABNORMAL HIGH (ref 0.00–0.07)
Basophils Absolute: 0.1 K/uL (ref 0.0–0.1)
Basophils Relative: 1 %
Eosinophils Absolute: 0.1 K/uL (ref 0.0–0.5)
Eosinophils Relative: 0 %
HCT: 27.7 % — ABNORMAL LOW (ref 36.0–46.0)
Hemoglobin: 8.8 g/dL — ABNORMAL LOW (ref 12.0–15.0)
Immature Granulocytes: 7 %
Lymphocytes Relative: 2 %
Lymphs Abs: 0.6 K/uL — ABNORMAL LOW (ref 0.7–4.0)
MCH: 26 pg (ref 26.0–34.0)
MCHC: 31.8 g/dL (ref 30.0–36.0)
MCV: 81.7 fL (ref 80.0–100.0)
Monocytes Absolute: 0.6 K/uL (ref 0.1–1.0)
Monocytes Relative: 3 %
Neutro Abs: 20.8 K/uL — ABNORMAL HIGH (ref 1.7–7.7)
Neutrophils Relative %: 87 %
Platelets: 304 K/uL (ref 150–400)
RBC: 3.39 MIL/uL — ABNORMAL LOW (ref 3.87–5.11)
RDW: 18.5 % — ABNORMAL HIGH (ref 11.5–15.5)
Smear Review: NORMAL
WBC: 23.9 K/uL — ABNORMAL HIGH (ref 4.0–10.5)
nRBC: 0.4 % — ABNORMAL HIGH (ref 0.0–0.2)

## 2024-03-20 LAB — TYPE AND SCREEN
ABO/RH(D): A POS
Antibody Screen: NEGATIVE
Unit division: 0
Unit division: 0
Unit division: 0

## 2024-03-20 LAB — BPAM RBC
Blood Product Expiration Date: 202509142359
Blood Product Expiration Date: 202509142359
Blood Product Expiration Date: 202509142359
ISSUE DATE / TIME: 202508162036
ISSUE DATE / TIME: 202508162353
ISSUE DATE / TIME: 202508162353
Unit Type and Rh: 6200
Unit Type and Rh: 6200
Unit Type and Rh: 6200

## 2024-03-20 LAB — POCT I-STAT, CHEM 8
BUN: 23 mg/dL — ABNORMAL HIGH (ref 6–20)
Calcium, Ion: 1.34 mmol/L (ref 1.15–1.40)
Chloride: 106 mmol/L (ref 98–111)
Creatinine, Ser: 1 mg/dL (ref 0.44–1.00)
Glucose, Bld: 250 mg/dL — ABNORMAL HIGH (ref 70–99)
HCT: 27 % — ABNORMAL LOW (ref 36.0–46.0)
Hemoglobin: 9.2 g/dL — ABNORMAL LOW (ref 12.0–15.0)
Potassium: 4.8 mmol/L (ref 3.5–5.1)
Sodium: 141 mmol/L (ref 135–145)
TCO2: 22 mmol/L (ref 22–32)

## 2024-03-20 LAB — COMPREHENSIVE METABOLIC PANEL WITH GFR
ALT: 37 U/L (ref 0–44)
AST: 32 U/L (ref 15–41)
Albumin: 1.5 g/dL — ABNORMAL LOW (ref 3.5–5.0)
Alkaline Phosphatase: 225 U/L — ABNORMAL HIGH (ref 38–126)
Anion gap: 7 (ref 5–15)
BUN: 24 mg/dL — ABNORMAL HIGH (ref 6–20)
CO2: 24 mmol/L (ref 22–32)
Calcium: 9.3 mg/dL (ref 8.9–10.3)
Chloride: 109 mmol/L (ref 98–111)
Creatinine, Ser: 0.92 mg/dL (ref 0.44–1.00)
GFR, Estimated: >60 mL/min (ref >60)
Glucose, Bld: 278 mg/dL — ABNORMAL HIGH (ref 70–99)
Potassium: 5 mmol/L (ref 3.5–5.1)
Sodium: 140 mmol/L (ref 135–145)
Total Bilirubin: 0.8 mg/dL (ref 0.0–1.2)
Total Protein: 4.9 g/dL — ABNORMAL LOW (ref 6.5–8.1)

## 2024-03-20 LAB — GLUCOSE, CAPILLARY
Glucose-Capillary: 246 mg/dL — ABNORMAL HIGH (ref 70–99)
Glucose-Capillary: 270 mg/dL — ABNORMAL HIGH (ref 70–99)
Glucose-Capillary: 314 mg/dL — ABNORMAL HIGH (ref 70–99)
Glucose-Capillary: 326 mg/dL — ABNORMAL HIGH (ref 70–99)
Glucose-Capillary: 203 mg/dL — ABNORMAL HIGH (ref 70–99)

## 2024-03-20 LAB — PROTIME-INR
INR: 1.2 (ref 0.8–1.2)
Prothrombin Time: 15.7 s — ABNORMAL HIGH (ref 11.4–15.2)

## 2024-03-20 MED ORDER — DEXAMETHASONE SODIUM PHOSPHATE 4 MG/ML IJ SOLN
INTRAMUSCULAR | Status: DC | PRN
  Administered 2024-03-20: 10 mg via INTRAVENOUS

## 2024-03-20 MED ORDER — PHENYLEPHRINE HCL (PRESSORS) 10 MG/ML IV SOLN
INTRAVENOUS | Status: DC | PRN
  Administered 2024-03-20 (×3): 60 ug via INTRAVENOUS

## 2024-03-20 MED ORDER — INSULIN GLARGINE-YFGN 100 UNIT/ML ~~LOC~~ SOLN
40.0000 [IU] | Freq: Every day | SUBCUTANEOUS | Status: DC
  Administered 2024-03-21 – 2024-03-23 (×3): 40 [IU] via SUBCUTANEOUS
  Filled 2024-03-20 (×3): qty 0.4

## 2024-03-20 MED ORDER — INSULIN ASPART 100 UNIT/ML IJ SOLN
4.0000 [IU] | Freq: Three times a day (TID) | INTRAMUSCULAR | Status: DC
  Administered 2024-03-21 – 2024-03-23 (×6): 4 [IU] via SUBCUTANEOUS

## 2024-03-20 MED ORDER — HEMOSTATIC AGENTS (NO CHARGE) OPTIME
TOPICAL | Status: DC | PRN
Start: 2024-03-19 — End: 2024-03-20
  Administered 2024-03-19: 1

## 2024-03-20 MED ORDER — FENTANYL CITRATE (PF) 100 MCG/2ML IJ SOLN
INTRAMUSCULAR | Status: DC | PRN
  Administered 2024-03-20 (×2): 50 ug via INTRAVENOUS

## 2024-03-20 MED ORDER — PHENYLEPHRINE HCL-NACL 20-0.9 MG/250ML-% IV SOLN
INTRAVENOUS | Status: DC | PRN
Start: 2024-03-20 — End: 2024-03-20
  Administered 2024-03-20: 40 ug/min via INTRAVENOUS

## 2024-03-20 MED ORDER — OXYCODONE HCL 5 MG PO TABS: 5.0000 mg | ORAL_TABLET | Freq: Once | ORAL | Status: DC | PRN

## 2024-03-20 MED ORDER — SUCCINYLCHOLINE CHLORIDE 20 MG/ML IJ SOLN
INTRAMUSCULAR | Status: DC | PRN
  Administered 2024-03-20: 140 mg via INTRAVENOUS

## 2024-03-20 MED ORDER — ONDANSETRON HCL 4 MG/2ML IJ SOLN: 4.0000 mg | Freq: Four times a day (QID) | INTRAMUSCULAR | Status: DC | PRN

## 2024-03-20 MED ORDER — LIDOCAINE HCL (CARDIAC) PF 50 MG/5ML IV SOSY
PREFILLED_SYRINGE | INTRAVENOUS | Status: DC | PRN
  Administered 2024-03-20: 6 mg via INTRAVENOUS

## 2024-03-20 MED ORDER — OXYCODONE HCL 5 MG/5ML PO SOLN: 5.0000 mg | Freq: Once | ORAL | Status: DC | PRN

## 2024-03-20 MED ORDER — INSULIN ASPART 100 UNIT/ML IJ SOLN
0.0000 [IU] | Freq: Three times a day (TID) | INTRAMUSCULAR | Status: DC
  Administered 2024-03-21: 4 [IU] via SUBCUTANEOUS
  Administered 2024-03-21: 3 [IU] via SUBCUTANEOUS
  Administered 2024-03-21: 4 [IU] via SUBCUTANEOUS
  Administered 2024-03-22 (×2): 3 [IU] via SUBCUTANEOUS
  Administered 2024-03-23 (×2): 4 [IU] via SUBCUTANEOUS

## 2024-03-20 MED ORDER — SODIUM CHLORIDE 0.9 % IV SOLN: INTRAVENOUS | Status: DC | PRN

## 2024-03-20 MED ORDER — INSULIN ASPART 100 UNIT/ML IJ SOLN
5.0000 [IU] | Freq: Once | INTRAMUSCULAR | Status: AC
  Administered 2024-03-20: 5 [IU] via SUBCUTANEOUS

## 2024-03-20 MED ORDER — 0.9 % SODIUM CHLORIDE (POUR BTL) OPTIME
TOPICAL | Status: DC | PRN
Start: 2024-03-20 — End: 2024-03-20
  Administered 2024-03-20: 2000 mL

## 2024-03-20 MED ORDER — ONDANSETRON HCL 4 MG/2ML IJ SOLN
INTRAMUSCULAR | Status: DC | PRN
  Administered 2024-03-20: 4 mg via INTRAVENOUS

## 2024-03-20 MED ORDER — VASOPRESSIN 20 UNIT/ML IV SOLN
INTRAVENOUS | Status: AC
Start: 2024-03-20 — End: 2024-03-20
  Filled 2024-03-20: qty 1

## 2024-03-20 MED ORDER — HEMOSTATIC AGENTS (NO CHARGE) OPTIME
TOPICAL | Status: DC | PRN
  Administered 2024-03-20: 1

## 2024-03-20 MED ORDER — FENTANYL CITRATE (PF) 100 MCG/2ML IJ SOLN: 25.0000 ug | INTRAMUSCULAR | Status: DC | PRN

## 2024-03-20 MED ORDER — ETOMIDATE 2 MG/ML IV SOLN
INTRAVENOUS | Status: DC | PRN
Start: 2024-03-20 — End: 2024-03-20
  Administered 2024-03-20: 20 mg via INTRAVENOUS

## 2024-03-20 NOTE — Progress Notes (Signed)
 [facility]   ROUNDING   NOTE TYISHIA AUNE FMW:[id number]  DOB: November 18, 1980  DOA: 03/13/2024  PCP: Georgina Speaks, FNP  03/20/2024,4:44 PM  LOS: 7 days    Code Status:  full     From: Home current Dispo: Unclear    43 year old black female with poorly controlled DM ty ii Presumed OSA, OHSS BMI 51  8/10 developed abscess on buttock that enlarged from upper thigh noticed on 03/07/2024, saw PCP started on Keflex  but developed worsening pain no fever   on arrival found to have a very large abscess-- ED general surgery/ OB/GYN consulted as CT showed moderate volume air in subcutaneous tissue near left labia and perineum with innumerable liver lesions pulmonary nodules and splenomegaly severe sepsis on admission with 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  Hepatoma?  Needs liver protocol CT-1 cm subcapsular hyperintense lesion peripheral segment 7 indeterminant-mild splenomegaly with diffuse iron  deposition 8/13 underwent repeat I&D 8 x 8 cm 6 cm anteroposteriorly---  secure chat and messaged Dr. Loretha of oncology to set up close outpatient follow-up for characterization of liver lesions Maintained on initially broad-spectrum coverage narrowed to ceftriaxone -changed antibiotics given sensitivities as below 8/15 repeat debridement based on CT scan done on 8/14 showing deep fascial plane edema medial compartment with few small foci of air and concern for necrotizing fasciitis 8/16 mod volume bleed from wound site managed bedsdie by Gen surg--tx to SDU 8/17 progressive brisk bleeding necessitating emergent OR evaluation-quick clot placed-transfused 1 unit  Plan  Hypovolemic shock like symptoms secondary to blood loss from wound-managed at bedside by general surgery Transfused-cut back saline to 50 cc/H (10 L positive), continues Unasyn   indefinitely until we can be sure source control has been completed--- wound management etc. 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  Hypercalcemia Peak calcium  was 12.1-stabilizing -- Dr. Loretha oncology -workup can be completed as an outpatient as inappropriate for treatment with recent surgeries-her office is aware to set up appointment  History gestational diabetes Current diabetes mellitus TY 2 Holding Farxiga  Trulicity  going forward given risk of infection CBG 200-326 increase Lantus   40 units metformin  1.5 every morning Increase to resistant scale, 4 units 3 times daily with meals no at bedtime for now  History of hypertension Still somewhat hypotensive-stop all antihypertensives  Mild hyperkalemia Etiology unclear-not on any specific replacement  Hypomagnesemia Improved since admission, give Mag-Ox 800 twice daily--iv replacement stopped  Depression Continue Zoloft  75 daily resumed, continue propranolol  as needed at home  Discussed with significant other at the bedside  Data Reviewed:   Sodium 140 potassium 5.0 CO2 24 BUN/creatinine 24/0.9-alk phos 225 AST/ALT 32/37 bilirubin 0.8 WBC 23 hemoglobin 8.8 platelet 304 left shift INR 1.2   DVT prophylaxis: SCD  Status is: Inpatient Remains inpatient appropriate because:   Requires further operative management     Subjective:  Overnight events noted-went back to the OR-seen with general surgery PA this morning-she looks comfortable smiling joking eating drinking husband is at bedside-pain is controlled Please see pictures from general surgery exam today-there does not  seem to be any blood  on packing    Objective + exam Vitals:   03/20/24 0600 03/20/24 0924 03/20/24 1251 03/20/24 1535  BP:  118/75 106/64 120/74  Pulse: 79  88 88  Resp: 20 (!) 21 19 18   Temp:  98.9 F (37.2 C) 98.2 F (36.8 C) 98.6 F (37 C)  TempSrc:  Oral Oral Oral  SpO2: 94% 100% 99% 97%  Weight:      Height:       Filed Weights   03/15/24 0500 03/18/24 0917 03/20/24 0414  Weight: (!) 140.1 kg (!) 139.7 kg (!) 164.4 kg     Examination:  Thick neck Mallampati 4 EOMI NCAT no focal deficit Chest clear no wheeze S1-S2 no murmur Tattoos Abdomen obese nontender cannot appreciate HSM Visual exam introitus/groin area shows large defect with unstained gauze packing in between folds of skin   Scheduled Meds:  fluticasone   1-2 spray Each Nare Daily    HYDROmorphone  (DILAUDID ) injection  0.5 mg Intravenous Once   ibuprofen   600 mg Oral TID WC   [START ON 03/21/2024] insulin  aspart  0-20 Units Subcutaneous TID WC   [START ON 03/21/2024] insulin  aspart  4 Units Subcutaneous TID WC   [START ON 03/21/2024] insulin  glargine-yfgn  40 Units Subcutaneous Daily   loratadine   10 mg Oral Daily   magnesium  oxide  800 mg Oral BID   metFORMIN   1,500 mg Oral Q breakfast   pantoprazole   40 mg Oral Daily   sertraline   75 mg Oral Daily   Continuous Infusions:  sodium chloride  50 mL/hr at 03/20/24 1300   ampicillin -sulbactam (UNASYN ) IV 200 mL/hr at 03/20/24 1300    Time 30 minutes Jai-Gurmukh Hajar Penninger, MD  [facility]

## 2024-03-20 NOTE — Plan of Care (Signed)
  Problem: Education: Goal: Knowledge of General Education information will improve Description: Including pain rating scale, medication(s)/side effects and non-pharmacologic comfort measures Outcome: Progressing   Problem: Health Behavior/Discharge Planning: Goal: Ability to manage health-related needs will improve Outcome: Progressing   Problem: Clinical Measurements: Goal: Ability to maintain clinical measurements within normal limits will improve Outcome: Progressing Goal: Will remain free from infection Outcome: Progressing Goal: Diagnostic test results will improve Outcome: Progressing Goal: Respiratory complications will improve Outcome: Progressing Goal: Cardiovascular complication will be avoided Outcome: Progressing   Problem: Activity: Goal: Risk for activity intolerance will decrease Outcome: Progressing   Problem: Nutrition: Goal: Adequate nutrition will be maintained Outcome: Progressing   Problem: Coping: Goal: Level of anxiety will decrease Outcome: Progressing   Problem: Elimination: Goal: Will not experience complications related to bowel motility Outcome: Progressing Goal: Will not experience complications related to urinary retention Outcome: Progressing   Problem: Pain Managment: Goal: General experience of comfort will improve and/or be controlled Outcome: Progressing   Problem: Safety: Goal: Ability to remain free from injury will improve Outcome: Progressing   Problem: Skin Integrity: Goal: Risk for impaired skin integrity will decrease Outcome: Progressing   Problem: Fluid Volume: Goal: Hemodynamic stability will improve Outcome: Progressing   Problem: Clinical Measurements: Goal: Diagnostic test results will improve Outcome: Progressing Goal: Signs and symptoms of infection will decrease Outcome: Progressing   Problem: Respiratory: Goal: Ability to maintain adequate ventilation will improve Outcome: Progressing   Problem:  Education: Goal: Ability to describe self-care measures that may prevent or decrease complications (Diabetes Survival Skills Education) will improve Outcome: Progressing Goal: Individualized Educational Video(s) Outcome: Progressing   Problem: Coping: Goal: Ability to adjust to condition or change in health will improve Outcome: Progressing   Problem: Health Behavior/Discharge Planning: Goal: Ability to identify and utilize available resources and services will improve Outcome: Progressing Goal: Ability to manage health-related needs will improve Outcome: Progressing   Problem: Fluid Volume: Goal: Ability to maintain a balanced intake and output will improve Outcome: Progressing

## 2024-03-20 NOTE — Transfer of Care (Signed)
 Immediate Anesthesia Transfer of Care Note  Patient: [name]  Procedure(s) Performed: EXPLORATION OF LEFT GROIN WOUND (Left: Groin)  Patient Location: PACU  Anesthesia Type:General  Level of Consciousness: awake and alert   Airway & Oxygen Therapy: Patient Spontanous Breathing and Patient connected to nasal cannula oxygen  Post-op Assessment: Report given to RN and Post -op Vital signs reviewed and stable  Post vital signs: Reviewed and stable  Last Vitals:  Vitals Value Taken Time  BP 102/64 03/20/24 01:30  Temp    Pulse 108 03/20/24 01:32  Resp 29 03/20/24 01:32  SpO2 95 % 03/20/24 01:32  Vitals shown include unfiled device data.  Last Pain:  Vitals:   03/19/24 2300  TempSrc: Oral  PainSc: 0-No pain      Patients Stated Pain Goal: 0 (03/19/24 2139)  Complications: No notable events documented.

## 2024-03-20 NOTE — Op Note (Signed)
 Date: 03/20/24  Patient: [name] MRN: [id number]  Preoperative Diagnosis: Bleeding from left inguinal/labial wound Postoperative Diagnosis: Same  Procedure: Exploration and washout of left inguinal/labial wound  Surgeon: Leonor Dawn, MD  EBL: 500 mL clotted blood in wound  Anesthesia: General endotracheal  Specimens: None  Indications: 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 She has bleeding from her wound throughout the day today, which has persisted despite packing and application of pressure. She has now developed hypotension and tachycardia. She was brought emergently back to the OR this evening for wound exploration.   Findings: Venous oozing from muscle and granulation tissue. No arterial bleeding. No residual necrotic tissue within the wound.  Procedure details: Informed consent was obtained in the preoperative area prior to the procedure. The patient was brought to the operating room, general anesthesia was induced and appropriate lines and drains were placed for intraoperative monitoring. Perioperative antibiotics were administered per SCIP guidelines. The patient was placed in lithotomy position, the previous packing was removed, and she was prepped and draped in the usual sterile fashion. A pre-procedure timeout was taken verifying patient identity, surgical site and procedure to be performed.  There was extensive clot within the wound, much of which was removed. The wound was thoroughly irrigated with sterile saline. There was some venous oozing from the muscle, which was controlled with cautery. No arterial bleeding was noted on thorough examination of the wound. The majority of the venous oozing originated on the left lateral side of the wound. This area was cauterized and a piece of Surgical snow was applied. This achieved good hemostasis. The  remainder of the wound appeared hemostatic. The wound was packed with Quick-clot and saline-moistened kerlex. Clean ABDs were applied over the wound.  The patient tolerated the procedure with no apparent complications. All counts were correct x2 at the end of the procedure. The patient was extubated and taken to PACU in stable condition.  Leonor Dawn, MD 03/20/24 12:55 AM

## 2024-03-20 NOTE — Anesthesia Procedure Notes (Signed)
 Procedure Name: Intubation Date/Time: 03/20/2024 12:24 AM  Performed by: Celia Alan HERO, CRNAPre-anesthesia Checklist: Patient identified, Emergency Drugs available, Suction available, Patient being monitored and Timeout performed Patient Re-evaluated:Patient Re-evaluated prior to induction Oxygen Delivery Method: Circle system utilized Preoxygenation: Pre-oxygenation with 100% oxygen Induction Type: IV induction and Rapid sequence Laryngoscope Size: Miller and 2 Grade View: Grade I Tube type: Oral Tube size: 7.5 mm Number of attempts: 1 Airway Equipment and Method: Stylet Placement Confirmation: ETT inserted through vocal cords under direct vision, positive ETCO2 and breath sounds checked- equal and bilateral Secured at: 23 cm Tube secured with: Tape Dental Injury: Teeth and Oropharynx as per pre-operative assessment

## 2024-03-20 NOTE — Progress Notes (Signed)
 When I had called and given report to Inocente, RN, the patient's BP had been 101 65 with MAP 76. As we were preparing patient to leave PACU, another BP ran and was 96 53 MAP 66. To double check BP we switched to the left arm and got BP 90 62 MAP 72. Since the pressures were a little softer than when I had called report, I called and spoke to 4E charge nurse Tim, RN and made sure they were comfortable with BP and pt returning to 4E. He said yes the patient could come back with current BP and MAP. Pt remained stable throughout PACU stay and just wanted to get back to her room to go to sleep. Gave bedside update to Inocente, Charity fundraiser. Patient verbalized gratitude for PACU staff.

## 2024-03-20 NOTE — Progress Notes (Signed)
 1 Day Post-Op  Subjective: No new complaints this morning.  Events from overnight noted.  No further bleeding at this time.   Objective: Vital signs in last 24 hours: Temp:  [97.8 F (36.6 C)-98.9 F (37.2 C)] 98.9 F (37.2 C) (08/17 0924) Pulse Rate:  [70-127] 79 (08/17 0600) Resp:  [15-31] 21 (08/17 0924) BP: (75-151)/(45-94) 118/75 (08/17 0924) SpO2:  [93 %-100 %] 94 % (08/17 0600) Weight:  [164.4 kg] 164.4 kg (08/17 0414) Last BM Date : 03/20/24  Intake/Output from previous day: 08/16 0701 - 08/17 0700 In: 4253.3 [P.O.:720; I.V.:1953.3; Blood:980; IV Piggyback:600] Out: 1000 [Urine:500; Blood:500] Intake/Output this shift: No intake/output data recorded.  PE: Gen:  Alert, NAD, pleasant GU: Left open labial/thigh wound dressed with no bleeding at this time.  Lab Results:  Recent Labs    03/19/24 1623 03/20/24 0105 03/20/24 0317  WBC 16.2*  --  23.9*  HGB 7.7* 9.2* 8.8*  HCT 24.5* 27.0* 27.7*  PLT 427*  --  304   BMET Recent Labs    03/19/24 0504 03/20/24 0105 03/20/24 0317  NA 136 141 140  K 5.0 4.8 5.0  CL 102 106 109  CO2 26  --  24  GLUCOSE 300* 250* 278*  BUN 17 23* 24*  CREATININE 0.82 1.00 0.92  CALCIUM  10.3  --  9.3   PT/INR Recent Labs    03/20/24 0317  LABPROT 15.7*  INR 1.2   CMP     Component Value Date/Time   NA 140 03/20/2024 0317   NA 136 03/10/2024 1458   K 5.0 03/20/2024 0317   CL 109 03/20/2024 0317   CO2 24 03/20/2024 0317   GLUCOSE 278 (H) 03/20/2024 0317   BUN 24 (H) 03/20/2024 0317   BUN 33 (H) 03/10/2024 1458   CREATININE 0.92 03/20/2024 0317   CALCIUM  9.3 03/20/2024 0317   CALCIUM  10.4 (H) 03/15/2024 1137   PROT 4.9 (L) 03/20/2024 0317   PROT 7.5 11/02/2023 0933   ALBUMIN  1.5 (L) 03/20/2024 0317   ALBUMIN  3.8 (L) 11/02/2023 0933   AST 32 03/20/2024 0317   ALT 37 03/20/2024 0317   ALKPHOS 225 (H) 03/20/2024 0317   BILITOT 0.8 03/20/2024 0317   BILITOT 0.5 11/02/2023 0933   GFRNONAA >60 03/20/2024 0317    GFRAA 108 08/28/2020 1735   Lipase     Component Value Date/Time   LIPASE 46 05/15/2021 1058    Studies/Results: No results found.   Anti-infectives: Anti-infectives (From admission, onward)    Start     Dose/Rate Route Frequency Ordered Stop   03/19/24 1415  fluconazole  (DIFLUCAN ) tablet 150 mg        150 mg Oral  Once 03/19/24 1329 03/19/24 1458   03/17/24 1600  Ampicillin -Sulbactam (UNASYN ) 3 g in sodium chloride  0.9 % 100 mL IVPB        3 g 200 mL/hr over 30 Minutes Intravenous Every 6 hours 03/17/24 1456     03/16/24 1215  cefTRIAXone  (ROCEPHIN ) 2 g in sodium chloride  0.9 % 100 mL IVPB  Status:  Discontinued        2 g 200 mL/hr over 30 Minutes Intravenous Daily 03/16/24 1124 03/17/24 1456   03/14/24 1000  meropenem  (MERREM ) 1 g in sodium chloride  0.9 % 100 mL IVPB  Status:  Discontinued        1 g 33.3 mL/hr over 180 Minutes Intravenous Every 8 hours 03/13/24 2140 03/16/24 1124   03/13/24 2200  linezolid  (ZYVOX ) IVPB 600  mg  Status:  Discontinued        600 mg 300 mL/hr over 60 Minutes Intravenous Every 12 hours 03/13/24 2055 03/16/24 1124   03/13/24 2130  meropenem  (MERREM ) 1 g in sodium chloride  0.9 % 100 mL IVPB        1 g 33.3 mL/hr over 180 Minutes Intravenous  Once 03/13/24 2055 03/14/24 0455   03/13/24 1900  clindamycin  (CLEOCIN ) IVPB 600 mg        600 mg 100 mL/hr over 30 Minutes Intravenous  Once 03/13/24 1859 03/14/24 1608   03/13/24 1730  vancomycin  (VANCOCIN ) IVPB 1000 mg/200 mL premix       Placed in Followed by Linked Group   1,000 mg 200 mL/hr over 60 Minutes Intravenous  Once 03/13/24 1625 03/13/24 2010   03/13/24 1730  ceFEPIme  (MAXIPIME ) 2 g in sodium chloride  0.9 % 100 mL IVPB        2 g 200 mL/hr over 30 Minutes Intravenous  Once 03/13/24 1728 03/13/24 1835   03/13/24 1630  vancomycin  (VANCOCIN ) IVPB 1000 mg/200 mL premix       Placed in Followed by Linked Group   1,000 mg 200 mL/hr over 60 Minutes Intravenous  Once 03/13/24 1625 03/13/24  1751        Assessment/Plan POD 6/4/2/1 s/p excisional debridement of NSTI of left labia/perineum - Additional abscess cavity opened in OR 8/15, confident infection is under control at this point - taken back to OR last night after evaluation at bedside for bleeding from her wound -quick clot placed and currently wound packed.  Leaving packing today and plan to remove quick clot on Monday and resume dressing changes -BP and vitals overall improved this morning. -hgb 8.8 today     - per [facility] -  Morbid obesity - BMI 50.33 T2DM - A1c on 8/7 8.8 HTN Mild intermittent asthma Liver masses and pulm nodules on CT  I reviewed nursing notes, hospitalist notes, last 24 h vitals and pain scores, last 48 h intake and output, last 24 h labs and trends, and last 24 h imaging results.   LOS: 7 days    Burnard FORBES Banter, [facility] 03/20/2024, 9:29 AM Please see Amion for pager number during day hours 7:00am-4:30pm

## 2024-03-21 ENCOUNTER — Telehealth: Payer: Self-pay

## 2024-03-21 ENCOUNTER — Encounter (HOSPITAL_COMMUNITY): Payer: Self-pay | Admitting: Surgery

## 2024-03-21 DIAGNOSIS — M726 Necrotizing fasciitis: Secondary | ICD-10-CM | POA: Diagnosis not present

## 2024-03-21 LAB — COMPREHENSIVE METABOLIC PANEL WITH GFR
ALT: 28 U/L (ref 0–44)
AST: 18 U/L (ref 15–41)
Albumin: 1.7 g/dL — ABNORMAL LOW (ref 3.5–5.0)
Alkaline Phosphatase: 185 U/L — ABNORMAL HIGH (ref 38–126)
Anion gap: 8 (ref 5–15)
BUN: 21 mg/dL — ABNORMAL HIGH (ref 6–20)
CO2: 25 mmol/L (ref 22–32)
Calcium: 8.8 mg/dL — ABNORMAL LOW (ref 8.9–10.3)
Chloride: 105 mmol/L (ref 98–111)
Creatinine, Ser: 0.75 mg/dL (ref 0.44–1.00)
GFR, Estimated: >60 mL/min (ref >60)
Glucose, Bld: 223 mg/dL — ABNORMAL HIGH (ref 70–99)
Potassium: 4.8 mmol/L (ref 3.5–5.1)
Sodium: 138 mmol/L (ref 135–145)
Total Bilirubin: 0.5 mg/dL (ref 0.0–1.2)
Total Protein: 5 g/dL — ABNORMAL LOW (ref 6.5–8.1)

## 2024-03-21 LAB — CBC WITH DIFFERENTIAL/PLATELET
Basophils Absolute: 0 K/uL (ref 0.0–0.1)
Basophils Relative: 0 %
Eosinophils Absolute: 0.3 K/uL (ref 0.0–0.5)
Eosinophils Relative: 2 %
HCT: 23.4 % — ABNORMAL LOW (ref 36.0–46.0)
Hemoglobin: 7.6 g/dL — ABNORMAL LOW (ref 12.0–15.0)
Lymphocytes Relative: 1 %
Lymphs Abs: 0.2 K/uL — ABNORMAL LOW (ref 0.7–4.0)
MCH: 26.6 pg (ref 26.0–34.0)
MCHC: 32.5 g/dL (ref 30.0–36.0)
MCV: 81.8 fL (ref 80.0–100.0)
Monocytes Absolute: 0.5 K/uL (ref 0.1–1.0)
Monocytes Relative: 3 %
Neutro Abs: 15.9 K/uL — ABNORMAL HIGH (ref 1.7–7.7)
Neutrophils Relative %: 94 %
Platelets: 333 K/uL (ref 150–400)
RBC: 2.86 MIL/uL — ABNORMAL LOW (ref 3.87–5.11)
RDW: 18.7 % — ABNORMAL HIGH (ref 11.5–15.5)
WBC: 16.9 K/uL — ABNORMAL HIGH (ref 4.0–10.5)
nRBC: 0.7 % — ABNORMAL HIGH (ref 0.0–0.2)

## 2024-03-21 LAB — GLUCOSE, CAPILLARY
Glucose-Capillary: 170 mg/dL — ABNORMAL HIGH (ref 70–99)
Glucose-Capillary: 157 mg/dL — ABNORMAL HIGH (ref 70–99)
Glucose-Capillary: 144 mg/dL — ABNORMAL HIGH (ref 70–99)
Glucose-Capillary: 141 mg/dL — ABNORMAL HIGH (ref 70–99)

## 2024-03-21 NOTE — Plan of Care (Signed)

## 2024-03-21 NOTE — Anesthesia Postprocedure Evaluation (Signed)
 Anesthesia Post Note  Patient: [name]  Procedure(s) Performed: EXPLORATION OF LEFT GROIN WOUND (Left: Groin)     Patient location during evaluation: PACU Anesthesia Type: General Level of consciousness: awake and alert Pain management: pain level controlled Vital Signs Assessment: post-procedure vital signs reviewed and stable Respiratory status: spontaneous breathing, nonlabored ventilation, respiratory function stable and patient connected to nasal cannula oxygen Cardiovascular status: blood pressure returned to baseline and stable Postop Assessment: no apparent nausea or vomiting Anesthetic complications: no   No notable events documented.  Last Vitals:  Vitals:   03/21/24 0527 03/21/24 0737  BP: 120/68 119/68  Pulse: 82 79  Resp: 14 17  Temp: 37 C 36.7 C  SpO2: 95% 97%    Last Pain:  Vitals:   03/21/24 0829  TempSrc:   PainSc: 10-Worst pain ever                 Latamara Melder S

## 2024-03-21 NOTE — Progress Notes (Signed)
 [facility]   ROUNDING   NOTE [name] FMW:[id number]  DOB: [birth year]  DOA: 03/13/2024  PCP: Georgina Speaks, FNP  03/21/2024,9:06 AM  LOS: 8 days    Code Status:  full     From: Home current Dispo: Unclear    43 year old black female with poorly controlled DM ty ii Presumed OSA, OHSS BMI 51  8/10 developed abscess on buttock that enlarged from upper thigh noticed on 03/07/2024, saw PCP started on Keflex  but developed worsening pain no fever   on arrival found to have a very large abscess-- ED general surgery/ OB/GYN consulted as CT showed moderate volume air in subcutaneous tissue near left labia and perineum with innumerable liver lesions pulmonary nodules and splenomegaly severe sepsis on admission with 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  Hepatoma?  Needs liver protocol CT-1 cm subcapsular hyperintense lesion peripheral segment 7 indeterminant-mild splenomegaly with diffuse iron  deposition 8/13 underwent repeat I&D 8 x 8 cm 6 cm anteroposteriorly---  secure chat and messaged Dr. Loretha of oncology to set up close outpatient follow-up for characterization of liver lesions Maintained on initially broad-spectrum coverage narrowed to ceftriaxone -changed antibiotics given sensitivities as below 8/15 repeat debridement based on CT scan done on 8/14 showing deep fascial plane edema medial compartment with few small foci of air and concern for necrotizing fasciitis 8/16 mod volume bleed from wound site managed bedsdie by Gen surg--tx to SDU 8/17 progressive brisk bleeding necessitating emergent OR evaluation-quick clot placed-transfused 1 unit  Plan  Hypovolemic shock like 2/2  blood loss from wound-managed at bedside by general surgery 8/16 w  Has dilutional anemia as well Transfused-threshold to transfuse again below hemoglobin  7-saline lock  Large labial abscess + strep anginosus left side status post multiple debridements above Strep anginosus with resistance --now  Unasyn  8/14 ongoing - -blood cultures 8/10 NGTD Pain is controlled on ibuprofen  600 3 times daily meal, Percocet 2 tab Q4 as needed moderate, Dilaudid  1 mg every 4 as needed breakthrough  Liver lesions unknown primary and?  Hypercalcemia Peak calcium  was 12.1-stabilizing -- Dr. Loretha oncology -workup can be completed as an outpatient as inappropriate for treatment with recent surgeries-her office is aware to set up appointment  History gestational diabetes Current diabetes mellitus TY 2-slightly better controlled, CBGs now 172 220 Holding Farxiga  Trulicity  going forward given risk of infection Currently Lantus   40 units metformin  1.5 every morning Current resistant scale, 4 units 3 times daily with meals no at bedtime for now  History of hypertension Hypertension has resolved as of 8/18  Mild hyperkalemia Etiology unclear-not on any specific replacement  Hypomagnesemia Improved since admission, give Mag-Ox 800 twice daily--iv replacement stopped  Depression Continue Zoloft  75 daily resumed, continue propranolol  as needed at home  No family today  Data Reviewed:   Sodium 138 potassium 4.8 BUN/creatinine 21/0.7 alk phos 185 albumin  1.7 WBC 23/-->16.9 Hemoglobin 8.8-->7.6 Platelet 333    DVT prophylaxis: SCD  Status is: Inpatient Remains inpatient appropriate because:   Requires further operative management     Subjective:  Now ++-wonders when all of this will be over Pain seems moderately controlled Await plan from general surgery   Objective + exam Vitals:   03/21/24 0006 03/21/24 0252 03/21/24 0527 03/21/24 0737  BP: 100/66  120/68 119/68  Pulse: 79 71 82 79  Resp: 19 19 14 17   Temp: 98  F (36.7 C)  98.6 F (37 C) 98 F (36.7 C)  TempSrc: Oral  Oral Oral  SpO2: 95% 97% 95% 97%  Weight:  (!) 148.2 kg     Height:       Filed Weights   03/18/24 0917 03/20/24 0414 03/21/24 0252  Weight: (!) 139.7 kg (!) 164.4 kg (!) 148.2 kg     Examination:  Thick neck Mallampati 4 EOMI NCAT no focal deficit Chest is clear no wheeze rales rhonchi sinus rhythm S1-S2 no murmur on exam Obese abdomen cannot appreciate HSM Introital wound not examined Power 5/5 grossly   Scheduled Meds:  fluticasone   1-2 spray Each Nare Daily    HYDROmorphone  (DILAUDID ) injection  0.5 mg Intravenous Once   insulin  aspart  0-20 Units Subcutaneous TID WC   insulin  aspart  4 Units Subcutaneous TID WC   insulin  glargine-yfgn  40 Units Subcutaneous Daily   loratadine   10 mg Oral Daily   magnesium  oxide  800 mg Oral BID   metFORMIN   1,500 mg Oral Q breakfast   pantoprazole   40 mg Oral Daily   sertraline   75 mg Oral Daily   Continuous Infusions:  ampicillin -sulbactam (UNASYN ) IV 3 g (03/21/24 0523)    Time 30 minutes Stephanie Deaja Rizo, MD  [facility]

## 2024-03-21 NOTE — Progress Notes (Signed)
 2 Days Post-Op  Subjective: No new complaints this morning. Just given IV dilaudid  in preparation for dressing changes. Confirms that her sister is a PACU RN who can perform her wound care at home.    Objective: Vital signs in last 24 hours: Temp:  [98 F (36.7 C)-98.6 F (37 C)] 98.5 F (36.9 C) (08/18 1248) Pulse Rate:  [71-92] 92 (08/18 1248) Resp:  [14-25] 25 (08/18 1248) BP: (100-129)/(66-77) 129/77 (08/18 1248) SpO2:  [95 %-97 %] 97 % (08/18 1248) Weight:  [148.2 kg] 148.2 kg (08/18 0252) Last BM Date : 03/20/24  Intake/Output from previous day: 08/17 0701 - 08/18 0700 In: 1581.7 [P.O.:720; I.V.:646.7; IV Piggyback:215] Out: -  Intake/Output this shift: Total I/O In: -  Out: 100 [Urine:100]  PE: Gen:  Alert, NAD, pleasant GU: Left open labial/thigh wound with all packing removed. Wound base is clean without evidence of bleeding. There is no cellulitis. Wound does tunnel ~3 cm in the 9-10 o'clock position   Lab Results:  Recent Labs    03/20/24 0317 03/21/24 0501  WBC 23.9* 16.9*  HGB 8.8* 7.6*  HCT 27.7* 23.4*  PLT 304 333   BMET Recent Labs    03/20/24 0317 03/21/24 0501  NA 140 138  K 5.0 4.8  CL 109 105  CO2 24 25  GLUCOSE 278* 223*  BUN 24* 21*  CREATININE 0.92 0.75  CALCIUM  9.3 8.8*   PT/INR Recent Labs    03/20/24 0317  LABPROT 15.7*  INR 1.2   CMP     Component Value Date/Time   NA 138 03/21/2024 0501   NA 136 03/10/2024 1458   K 4.8 03/21/2024 0501   CL 105 03/21/2024 0501   CO2 25 03/21/2024 0501   GLUCOSE 223 (H) 03/21/2024 0501   BUN 21 (H) 03/21/2024 0501   BUN 33 (H) 03/10/2024 1458   CREATININE 0.75 03/21/2024 0501   CALCIUM  8.8 (L) 03/21/2024 0501   CALCIUM  10.4 (H) 03/15/2024 1137   PROT 5.0 (L) 03/21/2024 0501   PROT 7.5 11/02/2023 0933   ALBUMIN  1.7 (L) 03/21/2024 0501   ALBUMIN  3.8 (L) 11/02/2023 0933   AST 18 03/21/2024 0501   ALT 28 03/21/2024 0501   ALKPHOS 185 (H) 03/21/2024 0501   BILITOT 0.5  03/21/2024 0501   BILITOT 0.5 11/02/2023 0933   GFRNONAA >60 03/21/2024 0501   GFRAA 108 08/28/2020 1735   Lipase     Component Value Date/Time   LIPASE 46 05/15/2021 1058    Studies/Results: No results found.   Anti-infectives: Anti-infectives (From admission, onward)    Start     Dose/Rate Route Frequency Ordered Stop   03/19/24 1415  fluconazole  (DIFLUCAN ) tablet 150 mg        150 mg Oral  Once 03/19/24 1329 03/19/24 1458   03/17/24 1600  Ampicillin -Sulbactam (UNASYN ) 3 g in sodium chloride  0.9 % 100 mL IVPB        3 g 200 mL/hr over 30 Minutes Intravenous Every 6 hours 03/17/24 1456     03/16/24 1215  cefTRIAXone  (ROCEPHIN ) 2 g in sodium chloride  0.9 % 100 mL IVPB  Status:  Discontinued        2 g 200 mL/hr over 30 Minutes Intravenous Daily 03/16/24 1124 03/17/24 1456   03/14/24 1000  meropenem  (MERREM ) 1 g in sodium chloride  0.9 % 100 mL IVPB  Status:  Discontinued        1 g 33.3 mL/hr over 180 Minutes Intravenous Every 8 hours  03/13/24 2140 03/16/24 1124   03/13/24 2200  linezolid  (ZYVOX ) IVPB 600 mg  Status:  Discontinued        600 mg 300 mL/hr over 60 Minutes Intravenous Every 12 hours 03/13/24 2055 03/16/24 1124   03/13/24 2130  meropenem  (MERREM ) 1 g in sodium chloride  0.9 % 100 mL IVPB        1 g 33.3 mL/hr over 180 Minutes Intravenous  Once 03/13/24 2055 03/14/24 0455   03/13/24 1900  clindamycin  (CLEOCIN ) IVPB 600 mg        600 mg 100 mL/hr over 30 Minutes Intravenous  Once 03/13/24 1859 03/14/24 1608   03/13/24 1730  vancomycin  (VANCOCIN ) IVPB 1000 mg/200 mL premix       Placed in Followed by Linked Group   1,000 mg 200 mL/hr over 60 Minutes Intravenous  Once 03/13/24 1625 03/13/24 2010   03/13/24 1730  ceFEPIme  (MAXIPIME ) 2 g in sodium chloride  0.9 % 100 mL IVPB        2 g 200 mL/hr over 30 Minutes Intravenous  Once 03/13/24 1728 03/13/24 1835   03/13/24 1630  vancomycin  (VANCOCIN ) IVPB 1000 mg/200 mL premix       Placed in Followed by Linked Group    1,000 mg 200 mL/hr over 60 Minutes Intravenous  Once 03/13/24 1625 03/13/24 1751        Assessment/Plan POD 7/5/3/2 s/p excisional debridement of NSTI of left labia/perineum - Additional abscess cavity opened in OR 8/15, confident infection is under control at this point - taken back to OR 8/16 PM after evaluation at bedside for bleeding from her wound - wound hemostatic, resume twice daily moist-to-dry dressing changes. I encouraged patient to have her sister come up to the hospital tomorrow for wound care teaching.  - BP and vitals stable, hgb 7.6 from 8.8, continue to trend    - per [facility] -  Morbid obesity - BMI 50.33 T2DM - A1c on 8/7 8.8 HTN Mild intermittent asthma Liver masses and pulm nodules on CT  I reviewed nursing notes, hospitalist notes, last 24 h vitals and pain scores, last 48 h intake and output, last 24 h labs and trends, and last 24 h imaging results.   LOS: 8 days    Almarie GORMAN Pringle, [facility] Surgery 03/21/2024, 2:53 PM Please see Amion for pager number during day hours 7:00am-4:30pm

## 2024-03-21 NOTE — Progress Notes (Signed)
 PT Cancellation Note  Patient Details Name: [name] MRN: [id number] DOB: 13-Feb-1981   Cancelled Treatment:    Reason Eval/Treat Not Completed: Other (comment). Pt in the middle of having her hair cut. Will return in AM.    Rodgers ORN [facility] 03/21/2024, 5:39 PM Rodgers Opal PT Acute Rehabilitation Services Office [phone]

## 2024-03-21 NOTE — Plan of Care (Signed)

## 2024-03-21 NOTE — Telephone Encounter (Signed)
 Patient was identified as falling into the [place] Measure - Diabetes.   Patient was: Attribution and/or data issue.  Validation/Investigation needed.  Explanation:  Patient was seen and had labs on 03/10/2024

## 2024-03-22 ENCOUNTER — Other Ambulatory Visit (HOSPITAL_COMMUNITY): Payer: Self-pay

## 2024-03-22 DIAGNOSIS — M726 Necrotizing fasciitis: Secondary | ICD-10-CM | POA: Diagnosis not present

## 2024-03-22 LAB — CBC WITH DIFFERENTIAL/PLATELET
Abs Granulocyte: 10.2 K/uL — ABNORMAL HIGH (ref 1.5–6.5)
Abs Immature Granulocytes: 0.69 K/uL — ABNORMAL HIGH (ref 0.00–0.07)
Basophils Absolute: 0.1 K/uL (ref 0.0–0.1)
Basophils Relative: 0 %
Eosinophils Absolute: 0.3 K/uL (ref 0.0–0.5)
Eosinophils Relative: 3 %
HCT: 23.3 % — ABNORMAL LOW (ref 36.0–46.0)
Hemoglobin: 7.4 g/dL — ABNORMAL LOW (ref 12.0–15.0)
Immature Granulocytes: 5 %
Lymphocytes Relative: 8 %
Lymphs Abs: 1 K/uL (ref 0.7–4.0)
MCH: 26.6 pg (ref 26.0–34.0)
MCHC: 31.8 g/dL (ref 30.0–36.0)
MCV: 83.8 fL (ref 80.0–100.0)
Monocytes Absolute: 1 K/uL (ref 0.1–1.0)
Monocytes Relative: 8 %
Neutro Abs: 10.2 K/uL — ABNORMAL HIGH (ref 1.7–7.7)
Neutrophils Relative %: 76 %
Platelets: 374 K/uL (ref 150–400)
RBC: 2.78 MIL/uL — ABNORMAL LOW (ref 3.87–5.11)
RDW: 19.6 % — ABNORMAL HIGH (ref 11.5–15.5)
WBC: 13.4 K/uL — ABNORMAL HIGH (ref 4.0–10.5)
nRBC: 3.1 % — ABNORMAL HIGH (ref 0.0–0.2)

## 2024-03-22 LAB — GLUCOSE, CAPILLARY
Glucose-Capillary: 141 mg/dL — ABNORMAL HIGH (ref 70–99)
Glucose-Capillary: 114 mg/dL — ABNORMAL HIGH (ref 70–99)
Glucose-Capillary: 130 mg/dL — ABNORMAL HIGH (ref 70–99)
Glucose-Capillary: 161 mg/dL — ABNORMAL HIGH (ref 70–99)

## 2024-03-22 MED ORDER — SODIUM CHLORIDE 0.9% FLUSH: 10.0000 mL | INTRAVENOUS | Status: DC | PRN

## 2024-03-22 MED ORDER — OXYCODONE-ACETAMINOPHEN 7.5-325 MG PO TABS
2.0000 | ORAL_TABLET | ORAL | 0 refills | Status: DC | PRN
  Filled 2024-03-22: qty 30, 7d supply, fill #0

## 2024-03-22 MED ORDER — INSULIN PEN NEEDLE 32G X 4 MM MISC
1.0000 | Freq: Three times a day (TID) | 0 refills | Status: DC
  Filled 2024-03-22: qty 100, fill #0
  Filled 2024-03-23: qty 100, 30d supply, fill #0

## 2024-03-22 MED ORDER — INSULIN ASPART 100 UNIT/ML IJ SOLN
4.0000 [IU] | Freq: Three times a day (TID) | INTRAMUSCULAR | 0 refills | Status: DC
Start: 2024-03-22 — End: 2024-03-30
  Filled 2024-03-22: qty 10, 84d supply, fill #0

## 2024-03-22 MED ORDER — BLOOD GLUCOSE TEST VI STRP
1.0000 | ORAL_STRIP | Freq: Three times a day (TID) | 0 refills | Status: DC
  Filled 2024-03-22: qty 100, 30d supply, fill #0

## 2024-03-22 MED ORDER — FERROUS SULFATE 325 (65 FE) MG PO TABS
325.0000 mg | ORAL_TABLET | Freq: Two times a day (BID) | ORAL | 2 refills | Status: DC
  Filled 2024-03-22: qty 60, 30d supply, fill #0

## 2024-03-22 MED ORDER — ACCU-CHEK SOFTCLIX LANCETS MISC
1.0000 | Freq: Three times a day (TID) | 0 refills | Status: DC
  Filled 2024-03-22: qty 100, 30d supply, fill #0

## 2024-03-22 MED ORDER — BLOOD GLUCOSE MONITORING SUPPL DEVI
1.0000 | PACK | Freq: Three times a day (TID) | 0 refills | Status: AC
Start: 2024-03-22 — End: ?
  Filled 2024-03-22: qty 1, 30d supply, fill #0

## 2024-03-22 MED ORDER — SODIUM CHLORIDE 0.9% FLUSH
10.0000 mL | Freq: Two times a day (BID) | INTRAVENOUS | Status: DC
  Administered 2024-03-22 – 2024-03-23 (×2): 10 mL

## 2024-03-22 MED ORDER — IBUPROFEN 400 MG PO TABS
600.0000 mg | ORAL_TABLET | Freq: Four times a day (QID) | ORAL | Status: DC | PRN
  Administered 2024-03-22 – 2024-03-23 (×2): 600 mg via ORAL
  Filled 2024-03-22 (×2): qty 2

## 2024-03-22 MED ORDER — LANCET DEVICE MISC
1.0000 | Freq: Three times a day (TID) | 0 refills | Status: AC
  Filled 2024-03-22: qty 1, fill #0

## 2024-03-22 MED ORDER — METHOCARBAMOL 500 MG PO TABS
500.0000 mg | ORAL_TABLET | Freq: Three times a day (TID) | ORAL | 0 refills | Status: DC | PRN
  Filled 2024-03-22: qty 30, 10d supply, fill #0

## 2024-03-22 MED ORDER — NOVOLOG FLEXPEN 100 UNIT/ML ~~LOC~~ SOPN
4.0000 [IU] | PEN_INJECTOR | Freq: Three times a day (TID) | SUBCUTANEOUS | 11 refills | Status: DC
  Filled 2024-03-22: qty 9, 30d supply, fill #0

## 2024-03-22 NOTE — Progress Notes (Signed)
 3 Days Post-Op  Subjective: No new complaints this morning. Has been getting OOB to bedside commode.   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 She lives at home with her 3 children and boyfriend. Her boyfriend is legally blind so she is the only one in the home driving.   Objective: Vital signs in last 24 hours: Temp:  [98.6 F (37 C)-99.2 F (37.3 C)] 98.6 F (37 C) (08/19 0350) Pulse Rate:  [90-109] 90 (08/19 0350) Resp:  [16-23] 16 (08/19 0350) BP: (115-144)/(73-92) 115/73 (08/19 0350) SpO2:  [95 %-100 %] 96 % (08/19 0350) Last BM Date : 03/20/24  Intake/Output from previous day: 08/18 0701 - 08/19 0700 In: 1200 [P.O.:1200] Out: 100 [Urine:100] Intake/Output this shift: No intake/output data recorded.  PE: Gen:  Alert, NAD, pleasant GU: Left open labial/thigh wound with all packing removed. Wound base is clean without evidence of bleeding. There is no cellulitis. Wound does tunnel ~3 cm in the 9-10 o'clock position. Patients sister at bedside with me demonstrated her ability to perform wound packing.    Lab Results:  Recent Labs    03/21/24 0501 03/22/24 1034  WBC 16.9* 13.4*  HGB 7.6* 7.4*  HCT 23.4* 23.3*  PLT 333 374   BMET Recent Labs    03/20/24 0317 03/21/24 0501  NA 140 138  K 5.0 4.8  CL 109 105  CO2 24 25  GLUCOSE 278* 223*  BUN 24* 21*  CREATININE 0.92 0.75  CALCIUM  9.3 8.8*   PT/INR Recent Labs    03/20/24 0317  LABPROT 15.7*  INR 1.2   CMP     Component Value Date/Time   NA 138 03/21/2024 0501   NA 136 03/10/2024 1458   K 4.8 03/21/2024 0501   CL 105 03/21/2024 0501   CO2 25 03/21/2024 0501   GLUCOSE 223 (H) 03/21/2024 0501   BUN 21 (H) 03/21/2024 0501   BUN 33 (H) 03/10/2024 1458   CREATININE 0.75 03/21/2024 0501   CALCIUM  8.8 (L) 03/21/2024 0501   CALCIUM  10.4 (H) 03/15/2024 1137   PROT 5.0 (L) 03/21/2024 0501   PROT 7.5  11/02/2023 0933   ALBUMIN  1.7 (L) 03/21/2024 0501   ALBUMIN  3.8 (L) 11/02/2023 0933   AST 18 03/21/2024 0501   ALT 28 03/21/2024 0501   ALKPHOS 185 (H) 03/21/2024 0501   BILITOT 0.5 03/21/2024 0501   BILITOT 0.5 11/02/2023 0933   GFRNONAA >60 03/21/2024 0501   GFRAA 108 08/28/2020 1735   Lipase     Component Value Date/Time   LIPASE 46 05/15/2021 1058    Studies/Results: No results found.   Anti-infectives: Anti-infectives (From admission, onward)    Start     Dose/Rate Route Frequency Ordered Stop   03/19/24 1415  fluconazole  (DIFLUCAN ) tablet 150 mg        150 mg Oral  Once 03/19/24 1329 03/19/24 1458   03/17/24 1600  Ampicillin -Sulbactam (UNASYN ) 3 g in sodium chloride  0.9 % 100 mL IVPB        3 g 200 mL/hr over 30 Minutes Intravenous Every 6 hours 03/17/24 1456     03/16/24 1215  cefTRIAXone  (ROCEPHIN ) 2 g in sodium chloride  0.9 % 100 mL IVPB  Status:  Discontinued        2 g 200 mL/hr over 30 Minutes Intravenous Daily 03/16/24 1124 03/17/24 1456   03/14/24 1000  meropenem  (MERREM )  1 g in sodium chloride  0.9 % 100 mL IVPB  Status:  Discontinued        1 g 33.3 mL/hr over 180 Minutes Intravenous Every 8 hours 03/13/24 2140 03/16/24 1124   03/13/24 2200  linezolid  (ZYVOX ) IVPB 600 mg  Status:  Discontinued        600 mg 300 mL/hr over 60 Minutes Intravenous Every 12 hours 03/13/24 2055 03/16/24 1124   03/13/24 2130  meropenem  (MERREM ) 1 g in sodium chloride  0.9 % 100 mL IVPB        1 g 33.3 mL/hr over 180 Minutes Intravenous  Once 03/13/24 2055 03/14/24 0455   03/13/24 1900  clindamycin  (CLEOCIN ) IVPB 600 mg        600 mg 100 mL/hr over 30 Minutes Intravenous  Once 03/13/24 1859 03/14/24 1608   03/13/24 1730  vancomycin  (VANCOCIN ) IVPB 1000 mg/200 mL premix       Placed in Followed by Linked Group   1,000 mg 200 mL/hr over 60 Minutes Intravenous  Once 03/13/24 1625 03/13/24 2010   03/13/24 1730  ceFEPIme  (MAXIPIME ) 2 g in sodium chloride  0.9 % 100 mL IVPB         2 g 200 mL/hr over 30 Minutes Intravenous  Once 03/13/24 1728 03/13/24 1835   03/13/24 1630  vancomycin  (VANCOCIN ) IVPB 1000 mg/200 mL premix       Placed in Followed by Linked Group   1,000 mg 200 mL/hr over 60 Minutes Intravenous  Once 03/13/24 1625 03/13/24 1751        Assessment/Plan POD 8/6/4/3 s/p excisional debridement of NSTI of left labia/perineum - Additional abscess cavity opened in OR 8/15, confident infection is under control at this point - taken back to OR 8/16 PM after evaluation at bedside for bleeding from her wound - wound hemostatic, hgb stable (7.4 from 7.6). Resume twice daily moist-to-dry dressing changes. Patients sister will assist with wound care. I have also ordered home health RN.  - follow up arranged for wound check. I have written a work note for 4 weeks out of work, it is possible she will need longer depending on the status of her wound. FMLA instructions provided to patient.  - Will discuss with MD but may not need more abx at discharge. Has completed 5 days of IV Unasyn  so far, has had source control since 8/15.    - per [facility] -  Morbid obesity - BMI 50.33 T2DM - A1c on 8/7 8.8 HTN Mild intermittent asthma Liver masses and pulm nodules on CT  I reviewed nursing notes, hospitalist notes, last 24 h vitals and pain scores, last 48 h intake and output, last 24 h labs and trends, and last 24 h imaging results.   LOS: 9 days    Almarie GORMAN Pringle, [facility] Surgery 03/22/2024, 3:06 PM Please see Amion for pager number during day hours 7:00am-4:30pm

## 2024-03-22 NOTE — Discharge Summary (Addendum)
 Physician Discharge Summary  [name] FMW:[id number] DOB: 1981-03-09 DOA: 03/13/2024  PCP: Georgina Speaks, FNP  Admit date: 03/13/2024 Discharge date: 03/22/2024  Time spent: 44 minutes  Recommendations for Outpatient Follow-up:  Wound care and management as an outpatient under care of Dr. Polly who will be CCed on this note-FMLA paperwork will be filled out by them Pain control provided at discharge does not need antibiotics going forward but close attention to wounds sister is a nurse and can manage Needs Chem-12 CBC 1 week Outpatient follow-up in the next several weeks with oncology to rule out hepatoma and discuss options etc. and workup Would desist from using oral meds other than metformin  starting on basal bolus regimen as below at discharge   Discharge Diagnoses:  MAIN problem for hospitalization   sepsis on admission with large abscess Blood loss anemia  Please see below for itemized issues addressed in HOpsital- refer to other progress notes for clarity if needed  Discharge Condition: Good  Diet recommendation: Heart healthy  Filed Weights   03/18/24 0917 03/20/24 0414 03/21/24 0252  Weight: (!) 139.7 kg (!) 164.4 kg (!) 148.2 kg    History of present illness:  43 year old black female with poorly controlled DM ty ii Presumed OSA, OHSS BMI 51   8/10 developed abscess on buttock that enlarged from upper thigh noticed on 03/07/2024, saw PCP started on Keflex  but developed worsening pain no fever   on arrival found to have a very large abscess-- ED general surgery/ OB/GYN consulted as CT showed moderate volume air in subcutaneous tissue near left labia and perineum with innumerable liver lesions pulmonary nodules and splenomegaly severe sepsis on admission with 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  Hepatoma?  Needs liver protocol CT-1 cm subcapsular hyperintense lesion peripheral segment 7 indeterminant-mild splenomegaly with diffuse iron  deposition 8/13 underwent repeat I&D 8 x 8 cm 6 cm anteroposteriorly---  secure chat and messaged Dr. Loretha of oncology to set up close outpatient follow-up for characterization of liver lesions Maintained on initially broad-spectrum coverage narrowed to ceftriaxone -changed antibiotics given sensitivities as below 8/15 repeat debridement based on CT scan done on 8/14 showing deep fascial plane edema medial compartment with few small foci of air and concern for necrotizing fasciitis 8/16 mod volume bleed from wound site managed bedsdie by Gen surg--tx to SDU 8/17 progressive brisk bleeding necessitating emergent OR evaluation-quick clot placed-transfused 1 unit 8/19 midline placed for access  Plan   Hypovolemic shock like 2/2  blood loss from wound-managed at bedside by general surgery 8/16 w  Has dilutional anemia as well Transfused-threshold to transfuse again below hemoglobin 7- She has stabilized reliably over the past several days, her hemoglobin is in the 7.4 range so I will start p.o. 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  Hypercalcemia Peak calcium  was 12.1-stabilizing --  Dr. Loretha oncology -workup can be completed as an outpatient as inappropriate for treatment with recent surgeries-her office is aware to set up  appointment CC oncologist to let her know patient is leaving and outpatient follow-up will be set up there is an ambulatory referral additionally-patient is aware that they will be in contact   History gestational diabetes Current diabetes mellitus TY 2-slightly better controlled, CBGs now 172 220 Holding Farxiga  Trulicity  going forward given risk of infection Currently Lantus   40 units metformin  1.5 every morning Current resistant scale, 4 units 3 times daily with meals no at bedtime for now   History of hypertension Hypeotension has resolved as of 8/18 and probably can resume propranolol  in the next several days at follow-up   Mild hyperkalemia Etiology unclear-not on any specific replacement   Hypomagnesemia Improved since admission, stopped checking   Depression Continue Zoloft  75 daily resumed  Discharge Exam: Vitals:   03/21/24 2305 03/22/24 0350  BP: (!) 144/85 115/73  Pulse: 100 90  Resp: 19 16  Temp: 98.8 F (37.1 C) 98.6 F (37 C)  SpO2: 100% 96%    Subj on day of d/c   In good spirits although was tired earlier today got up and moved around Eating and drinking well pain is moderately controlled she has no fever  General Exam on discharge  EOMI NCAT no focal deficit no icterus no pallor Chest clear No added sounds to chest Abdomen obese nontender no rebound no guarding Wound not examined see surgery notes  Discharge Instructions   Discharge Instructions     Amb Referral to Oncology Navigator   Complete by: As directed    Please select the type of referral navigator?: GI Comment - Dr. Zelma appt 2 weeks--put on AVS      Allergies as of 03/22/2024       Reactions   Fish Allergy Anaphylaxis   Other Anaphylaxis, Other (See Comments)   Pt has fish allergy Pt has allergy to butter cookies   Bactrim  [sulfamethoxazole -trimethoprim ] Diarrhea, Rash   Abdominal cramps        Medication List     STOP taking these medications    cephALEXin   500 MG capsule Commonly known as: KEFLEX    dapagliflozin  propanediol 10 MG Tabs tablet Commonly known as: Farxiga    diclofenac  75 MG EC tablet Commonly known as: VOLTAREN    fluconazole  100 MG tablet Commonly known as: DIFLUCAN    losartan -hydrochlorothiazide  50-12.5 MG tablet Commonly known as: HYZAAR   propranolol  10 MG tablet Commonly known as: INDERAL    Trulicity  1.5 MG/0.5ML Soaj Generic drug: Dulaglutide        TAKE these medications    Accu-Chek Guide test strip Generic drug: glucose blood 1 each by Other route daily in the afternoon. Use as instructed What changed: Another medication with the same name was added. Make sure you understand how and when to take each.   BLOOD GLUCOSE TEST STRIPS Strp 1 each by Does not apply route 3 (three) times daily. Use as directed to check blood sugar. May dispense any manufacturer covered by patient's insurance and fits patient's device. What changed: You were already taking a medication with the same name, and this prescription was added. Make sure you understand how and when to take each.   accu-chek multiclix lancets USE TO CHECK BLOOD SUGARS UP TO 4 TIMES DAILY  DX CODE:E11.9 What changed: Another medication with the same name was added. Make sure you understand how and when to take each.   Lancets  Misc 1 each by Does not apply route 3 (three) times daily. Use as directed to check blood sugar. May dispense any manufacturer covered by patient's insurance and fits patient's device. What changed: You were already taking a medication with the same name, and this prescription was added. Make sure you understand how and when to take each.   albuterol  108 (90 Base) MCG/ACT inhaler Commonly known as: VENTOLIN  HFA Inhale 1-2 puffs into the lungs every 6 (six) hours as needed for wheezing or shortness of breath. What changed: how much to take   atorvastatin  20 MG tablet Commonly known as: LIPITOR Take 1 tablet (20 mg total) by  mouth daily.   Basaglar  KwikPen 100 UNIT/ML Inject 40 Units into the skin daily.   Blood Glucose Monitoring Suppl Devi 1 each by Does not apply route 3 (three) times daily. May dispense any manufacturer covered by patient's insurance.   cetirizine  10 MG tablet Commonly known as: ZyrTEC  Allergy Take 1 tablet (10 mg total) by mouth daily. What changed:  when to take this reasons to take this   fluticasone  50 MCG/ACT nasal spray Commonly known as: FLONASE  Place 1-2 sprays into both nostrils daily. What changed:  how much to take when to take this reasons to take this   insulin  aspart 100 UNIT/ML injection Commonly known as: novoLOG  Inject 4 Units into the skin 3 (three) times daily with meals.   insulin  aspart 100 UNIT/ML injection Commonly known as: novoLOG  Inject 4 Units into the skin 3 (three) times daily with meals. If eating and Blood Glucose (BG) 80 or higher inject 0 units for meal coverage and add correction dose per scale. If not eating, correction dose only. BG <150= 0 unit; BG 150-200= 1 unit; BG 201-250= 2 unit; BG 251-300= 3 unit; BG 301-350= 4 unit; BG 351-400= 5 unit; BG >400= 6 unit and Call Primary Care.   Insulin  Pen Needle 31G X 8 MM Misc 1 Device by Does not apply route daily.   Insulin  Syringe-Needle U-100 25G X 5/8 1 ML Misc 1 each by Does not apply route 3 (three) times daily. May dispense any manufacturer covered by patient's insurance.   Lancet Device Misc 1 each by Does not apply route 3 (three) times daily. May dispense any manufacturer covered by patient's insurance.   levonorgestrel  20 MCG/24HR IUD Commonly known as: MIRENA  1 each by Intrauterine route once.   metFORMIN  750 MG 24 hr tablet Commonly known as: GLUCOPHAGE -XR Take 2 tablets (1,500 mg total) by mouth daily with breakfast.   methocarbamol  500 MG tablet Commonly known as: ROBAXIN  Take 1 tablet (500 mg total) by mouth every 8 (eight) hours as needed for muscle spasms.    omeprazole 20 MG capsule Commonly known as: PRILOSEC Take 20 mg by mouth daily.   oxyCODONE -acetaminophen  7.5-325 MG tablet Commonly known as: PERCOCET Take 2 tablets by mouth every 4 (four) hours as needed for moderate pain (pain score 4-6).   sertraline  50 MG tablet Commonly known as: ZOLOFT  Take 1 1/2 tablet by mouth daily   triamcinolone  0.025 % ointment Commonly known as: KENALOG  Apply 1 application topically 2 (two) times daily. What changed:  when to take this reasons to take this       Allergies  Allergen Reactions   Fish Allergy Anaphylaxis   Other Anaphylaxis and Other (See Comments)    Pt has fish allergy Pt has allergy to butter cookies   Bactrim  [Sulfamethoxazole -Trimethoprim ] Diarrhea and Rash    Abdominal cramps  Follow-up Information     Georgina Speaks, FNP Follow up.   Specialty: General Practice Contact information: [address] [place] [address] [phone]         Maczis, Puja Salvo, [name]. Go on 04/08/2024.   Specialty: General Surgery Why: at 1:45 PM for post-operative follow up and wound check. please arrive 20-30 minutes early to your appointment. Contact information: [address] [place] [address] [phone]                  The results of significant diagnostics from this hospitalization (including imaging, microbiology, ancillary and laboratory) are listed below for reference.    Significant Diagnostic Studies: CT PELVIS W CONTRAST Result Date: 03/17/2024 CLINICAL DATA:  Necrotizing fasciitis suspected EXAM: CT PELVIS WITH CONTRAST TECHNIQUE: Multidetector CT imaging of the pelvis was performed using the standard protocol following the bolus administration of intravenous contrast. RADIATION DOSE REDUCTION: This exam was performed according to the departmental dose-optimization program which includes automated exposure control, adjustment of the mA and/or kV according to patient size  and/or use of iterative reconstruction technique. CONTRAST:  75mL OMNIPAQUE  IOHEXOL  350 MG/ML SOLN COMPARISON:  CT abdomen pelvis March 13, 2024. FINDINGS: Urinary Tract:  Bladder is unremarkable. Bowel: Necrotizing fascitis/abscess formation centered within the left perineum with interval decrease in fluid component status post incision and drainage. There is persistent tract of air extending from the perianal region anteriorly to the left labia major and mons pubis with a large collection of subcutaneous air (post incision and drainage), now more confluent measuring 11 x 2.6 cm (3/138). Additional punctate foci of air extend to the left inguinal region (3/95).Compared to prior exam the fluid collection is significantly decreased common near completely resolved. Persistent diffuse subcutaneous fat stranding. Large stool throughout the colon. Visualized bowel loops are otherwise within normal limits. No bowel obstruction. Vascular/Lymphatic: Multiple prominent enhancing bilateral inguinal lymphadenopathy left greater than right likely reactive. Reproductive: Fibroid uterus. The left adnexal enhancing lesion measuring 2.8 by 3.7 cm containing a ring calcification measuring 17 mm may represent a pedunculated calcified fibroid versus an ovarian lesion. IUD in proper position in endometrial cavity. Other: Fat containing umbilical/paraumbilical multiloculated hernia. Partially visualized nodular liver Musculoskeletal: No suspicious osseous lesion IMPRESSION: Perineal/left labia major subcutaneous abscess formation with interval near complete resolution of fluid collection, status post incision and drainage. Residual subcutaneous emphysematous changes and fat stranding as detail above. Bilateral inguinal enhancing lymphadenopathy likely reactive. Electronically Signed   By: Megan  Zare M.D.   On: 03/17/2024 18:29   CT FEMUR LEFT W CONTRAST Addendum Date: 03/17/2024 ADDENDUM REPORT: 03/17/2024 18:27 ADDENDUM: These  results were called by telephone at the time of interpretation on 03/17/2024 at 6:27 pm to provider Dr. Royal, who verbally acknowledged these results. Electronically Signed   By: Greig Pique M.D.   On: 03/17/2024 18:27   Result Date: 03/17/2024 CLINICAL DATA:  Necrotizing fasciitis suspected. EXAM: CT OF THE LOWER LEFT EXTREMITY WITH CONTRAST TECHNIQUE: Multidetector CT imaging of the lower left extremity was performed according to the standard protocol following intravenous contrast administration. RADIATION DOSE REDUCTION: This exam was performed according to the departmental dose-optimization program which includes automated exposure control, adjustment of the mA and/or kV according to patient size and/or use of iterative reconstruction technique. CONTRAST:  75mL OMNIPAQUE  IOHEXOL  350 MG/ML SOLN COMPARISON:  None Available. FINDINGS: Bones/Joint/Cartilage There is no acute fracture or dislocation. No focal osseous lesion or cortical erosion identified. No joint effusion identified of  the left hip or left knee. Ligaments Suboptimally assessed by CT. Muscles and Tendons There is some deep fascial plane edema of the medial compartment of the upper left thigh with a few small foci of air best seen on image 4/102. No focal hematoma or fluid collection. No foreign body. Soft tissues There is soft tissue ulceration in the inferior left perineum involving subcutaneous tissues. There is surrounding subcutaneous edema and air without focal fluid collection identified. There is also subcutaneous edema and swelling of the left thigh with medial left thigh skin thickening. There is also some subcutaneous edema of the mons pubis. Small amount of air seen within the subcutaneous tissues of the left inguinal region as well. There are prominent bilateral inguinal lymph nodes. IUD is noted in the uterus. IMPRESSION: 1. Deep fascial plane edema of the medial compartment of the upper left thigh with a few small foci of air.  Findings are concerning for necrotizing fasciitis. 2. Soft tissue ulceration in the inferior left perineum. Subcutaneous edema and swelling of the proximal left thigh and mons pubis with associated scattered subcutaneous foci of air worrisome for cellulitis. 3. No acute osseous abnormality. Electronically Signed: By: Greig Pique M.D. On: 03/17/2024 18:13   MR LIVER W WO CONTRAST Result Date: 03/16/2024 EXAM: MRI ABDOMEN 03/15/2024 09:12:13 PM TECHNIQUE: Multiplanar multisequence MRI of the abdomen was performed with and without the administration of intravenous contrast. COMPARISON: 03/13/2024 CLINICAL HISTORY: Multiple liver lesions. FINDINGS: LIVER: The liver has a diffusely nodular contour compatible with cirrhosis. No arterial phase enhancing liver lesions identified. No abnormal areas of restricted diffusion within the periphery of segments 7. There is a 1 cm subcapsular T2 hyperintense lesion on axial image 17/5. This does not show enhancement on the postcontrast images. GALLBLADDER AND BILIARY SYSTEM: Gallbladder appears decompressed. SPLEEN: The spleen appears heterogeneous on the T2-weighted sequences and post-contrast sequences. The spleen measures 13.3 cm in craniocaudal dimension. Increased signal throughout the spleen on the out of phase sequences compatible with iron  deposition. PANCREAS: No pancreatic inflammation, mass or main duct dilatation. ADRENAL GLANDS: Unremarkable. KIDNEYS: No signs of hydronephrosis. There are 2 cysts within the right kidney measuring up to 1.5 cm. No follow up imaging recommended. LYMPH NODES: No abdominal adenopathy. VASCULATURE: Normal caliber of the abdominal aorta. PERITONEUM: No abdominal ascites. ABDOMINAL WALL: No hernia. No mass. BOWEL: Grossly unremarkable. No bowel obstruction. BONES: No acute abnormality or worrisome osseous lesion. SOFT TISSUES: Unremarkable. IMPRESSION: 1. Diffusely nodular contour of the liver compatible with cirrhosis. No arterial phase  enhancing liver lesions identified to suggest HCC. Recommend correlation with alpha-fetoprotein levels. Given the limitations of the current exam due to motion artifact and patient body habitus, if there is a high clinical concern for underlying hepatoma recommend repeat imaging with dedicated liver protocol CT. 2. 1 cm subcapsular T2 hyperintense lesion in the periphery of segment 7, without postcontrast enhancement. Indeterminate. Consider follow up imaging in 3 to 6 months. 3. Mild splenomegaly with signs of diffuse iron  deposition. Electronically signed by: Waddell Calk MD 03/16/2024 08:47 AM EDT RP Workstation: [id number]   CT HEAD WO CONTRAST ( ) Result Date: 03/16/2024 CLINICAL DATA:  43 year old female admitted with perineum infection. Progressive headache. EXAM: CT HEAD WITHOUT CONTRAST TECHNIQUE: Contiguous axial images were obtained from the base of the skull through the vertex without intravenous contrast. RADIATION DOSE REDUCTION: This exam was performed according to the departmental dose-optimization program which includes automated exposure control, adjustment of the mA and/or kV according to patient size and/or use  of iterative reconstruction technique. COMPARISON:  Head CT 11/10/2023. FINDINGS: Brain: Cerebral volume stable, within normal limits. No midline shift, ventriculomegaly, mass effect, evidence of mass lesion, intracranial hemorrhage or evidence of cortically based acute infarction. Gray-white matter differentiation is within normal limits throughout the brain. Vascular: No suspicious intracranial vascular hyperdensity. Skull: Stable and intact. Sinuses/Orbits: Visualized paranasal sinuses and mastoids are stable and well aerated. Other: Visualized orbits and scalp soft tissues are within normal limits. IMPRESSION: Stable and normal noncontrast Head CT. Electronically Signed   By: VEAR Hurst M.D.   On: 03/16/2024 07:12   DG Chest Port 1 View Result Date: 03/14/2024 EXAM: 1 VIEW XRAY  OF THE CHEST 03/14/2024 05:51:25 AM COMPARISON: PA and lateral radiographs of the chest dated 08/25/2021. CLINICAL HISTORY: Sepsis (Bilateral lower lobe airspace opacities seen on CT abd/pelvis, with incomplete visualization of the lungs) FINDINGS: LUNGS AND PLEURA: No focal pulmonary opacity. No pulmonary edema. No pleural effusion. No pneumothorax. HEART AND MEDIASTINUM: No acute abnormality of the cardiac and mediastinal silhouettes. BONES AND SOFT TISSUES: No acute osseous abnormality. IMPRESSION: 1. No acute findings. Electronically signed by: evalene coho 03/14/2024 09:46 AM EDT RP Workstation: [id number]   CT ABDOMEN PELVIS W CONTRAST Result Date: 03/13/2024 CLINICAL DATA:  Abdominal pain, acute, nonlocalized Large labial abscess Abscess on buttock, enlarged to upper thigh, first noticed 6 days ago. Hx fibroid, UTI, morbid obesity, cesarean section, and tubal ligation. EXAM: CT ABDOMEN AND PELVIS WITH CONTRAST TECHNIQUE: Multidetector CT imaging of the abdomen and pelvis was performed using the standard protocol following bolus administration of intravenous contrast. RADIATION DOSE REDUCTION: This exam was performed according to the departmental dose-optimization program which includes automated exposure control, adjustment of the mA and/or kV according to patient size and/or use of iterative reconstruction technique. CONTRAST:  OMNIPAQUE  IOHEXOL  300 MG/ML  SOLN COMPARISON:  CT renal 03/04/2022 FINDINGS: Lower chest: Partially visualized bilateral lower lobe cluster of pulmonary nodules/patchy airspace opacities. Hepatobiliary: Nodular hepatic contour with innumerable subcentimeter and pericentimeter hypodense lesions. No focal liver abnormality. No gallstones, gallbladder wall thickening, or pericholecystic fluid. No biliary dilatation. Pancreas: No focal lesion. Normal pancreatic contour. No surrounding inflammatory changes. No main pancreatic ductal dilatation. Spleen: The spleen is enlarged  in caliber measuring up to 15 cm. No focal lesion. Adrenals/Urinary Tract: No adrenal nodule bilaterally. Bilateral kidneys enhance symmetrically. No hydronephrosis. No hydroureter. Right nephrolithiasis measuring up to 3 mm. No left nephrolithiasis. No ureterolithiasis bilaterally. The urinary bladder is unremarkable. Stomach/Bowel: Stomach is within normal limits. No evidence of bowel wall thickening or dilatation. Appendix appears normal. Vascular/Lymphatic: No abdominal aorta or iliac aneurysm. Mild atherosclerotic plaque of the aorta and its branches. No abdominal, pelvic, or inguinal lymphadenopathy. Reproductive: T-shaped intrauterine vice within the lower uterine segment. Redemonstration of a stable peripherally calcified left adnexal region mass that may represent a fundal fibroid versus ovarian lesion. Otherwise uterus and bilateral adnexa are unremarkable. Other: No intraperitoneal free fluid. No intraperitoneal free gas. No organized fluid collection. Musculoskeletal: Diffuse left labial subcutaneus soft tissue edema and emphysema. No organized fluid collection. Finding extends to the inferior portion of the mons pubis and superior most medial left thigh. Overlying dermal thickening. No suspicious lytic or blastic osseous lesions. No acute displaced fracture. Multilevel degenerative changes of the spine. IMPRESSION: 1. Diffuse left labial subcutaneus soft tissue edema and emphysema. No organized fluid collection. Finding extends to the inferior portion of the mons pubis and superior most medial left thigh. Overlying dermal thickening. 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 Correlate clinically  as this is a clinical diagnosis. 2. Partially visualized bilateral lower lobe cluster of pulmonary nodules/patchy airspace opacities. Findings suggestive of infection/inflammation. Correlate with chest x-ray PA and lateral view for further evaluation. Other imaging findings of potential clinical significance: 1.  Cirrhosis with portal hypertension. Innumerable underlying masses suggestive of hepatocellular carcinoma. No focal liver lesions identified. Please note that liver protocol enhanced MR and CT are the most sensitive tests for the screening detection of hepatocellular carcinoma in the high risk setting of cirrhosis. 2. 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 3. Nonobstructive right nephrolithiasis. Electronically Signed   By: Morgane  Naveau M.D.   On: 03/13/2024 18:33   DG Foot 2 Views Left Result Date: 02/25/2024 Please see detailed radiograph report in office note.   Microbiology: Recent Results (from the past 240 hours)  Culture, blood (x 2)     Status: None   Collection Time: 03/13/24  9:46 PM   Specimen: BLOOD  Result Value Ref Range Status   Specimen Description BLOOD BLOOD LEFT ARM  Final   Special Requests   Final    BOTTLES DRAWN AEROBIC AND ANAEROBIC Blood Culture adequate volume   Culture   Final    NO GROWTH 5 DAYS Performed at [name], [address], [place], [place] [address]    Report Status 03/18/2024 FINAL  Final  Culture, blood (x 2)     Status: None   Collection Time: 03/13/24  9:46 PM   Specimen: BLOOD  Result Value Ref Range Status   Specimen Description BLOOD BLOOD LEFT HAND  Final   Special Requests   Final    BOTTLES DRAWN AEROBIC AND ANAEROBIC Blood Culture adequate volume   Culture   Final    NO GROWTH 5 DAYS Performed at [facility], [address] [place], [place] [address]    Report Status 03/18/2024 FINAL  Final  Aerobic/Anaerobic Culture w Gram Stain (surgical/deep wound)     Status: None   Collection Time: 03/13/24 11:02 PM   Specimen: Wound; Abscess  Result Value Ref Range Status   Specimen Description WOUND  Final   Special Requests NONE  Final   Gram Stain   Final    FEW WBC PRESENT, PREDOMINANTLY PMN ABUNDANT GRAM NEGATIVE RODS MODERATE GRAM POSITIVE COCCI    Culture   Final    FEW STREPTOCOCCUS  ANGINOSIS SUSCEPTIBILITIES PERFORMED ON PREVIOUS CULTURE WITHIN THE LAST 5 DAYS. ABUNDANT PREVOTELLA BIVIA BETA LACTAMASE POSITIVE Performed at [facility], [address] [place], [place] [address]    Report Status 03/18/2024 FINAL  Final  Aerobic/Anaerobic Culture w Gram Stain (surgical/deep wound)     Status: None   Collection Time: 03/13/24 11:09 PM   Specimen: Wound; Tissue  Result Value Ref Range Status   Specimen Description WOUND  Final   Special Requests NONE  Final   Gram Stain   Final    FEW WBC PRESENT, PREDOMINANTLY PMN ABUNDANT GRAM NEGATIVE RODS MODERATE GRAM POSITIVE COCCI    Culture   Final    FEW STREPTOCOCCUS ANGINOSIS ABUNDANT PREVOTELLA BIVIA BETA LACTAMASE POSITIVE Performed at [facility], [address], [place], [place] [address]    Report Status 03/17/2024 FINAL  Final   Organism ID, Bacteria STREPTOCOCCUS ANGINOSIS  Final      Susceptibility   Streptococcus anginosis - MIC*    PENICILLIN <=0.06 SENSITIVE Sensitive     CEFTRIAXONE  <=0.12 SENSITIVE Sensitive     ERYTHROMYCIN 2  RESISTANT Resistant     LEVOFLOXACIN  1 SENSITIVE Sensitive     VANCOMYCIN  0.5 SENSITIVE Sensitive     * FEW STREPTOCOCCUS ANGINOSIS  Urine Culture (for pregnant, neutropenic or urologic patients or patients with an indwelling urinary catheter)     Status: None   Collection Time: 03/14/24 11:17 PM   Specimen: Urine, Clean Catch  Result Value Ref Range Status   Specimen Description URINE, CLEAN CATCH  Final   Special Requests NONE  Final   Culture   Final    NO GROWTH Performed at [facility], [address], [place], [place] [address]    Report Status 03/17/2024 FINAL  Final     Labs: Basic Metabolic Panel: Recent Labs  Lab 03/16/24 0456 03/17/24 0936 03/18/24 1414 03/19/24 0504 03/20/24 0105 03/20/24 0317 03/21/24 0501  NA 137 140 137 136 141 140 138  K 4.0 4.3 4.7 5.0 4.8 5.0 4.8  CL 105 106 106 102 106 109 105  CO2 23 25 25 26   --  24  25  GLUCOSE 228* 147* 124* 300* 250* 278* 223*  BUN 15 12 11 17  23* 24* 21*  CREATININE 0.72 0.58 0.67 0.82 1.00 0.92 0.75  CALCIUM  10.7* 10.1 9.5 10.3  --  9.3 8.8*  MG 1.5* 1.5* 2.0 1.9  --   --   --    Liver Function Tests: Recent Labs  Lab 03/17/24 0936 03/18/24 1414 03/19/24 0504 03/20/24 0317 03/21/24 0501  AST 43* 84* 37 32 18  ALT 36 69* 59* 37 28  ALKPHOS 293* 364* 351* 225* 185*  BILITOT 0.7 0.5 0.7 0.8 0.5  PROT 6.2* 6.4* 6.9 4.9* 5.0*  ALBUMIN  1.8* 1.9* 2.0* 1.5* 1.7*   No results for input(s): LIPASE, AMYLASE in the last 168 hours. No results for input(s): AMMONIA in the last 168 hours. CBC: Recent Labs  Lab 03/19/24 0504 03/19/24 1623 03/20/24 0105 03/20/24 0317 03/21/24 0501 03/22/24 1034  WBC 16.7* 16.2*  --  23.9* 16.9* 13.4*  NEUTROABS 14.1* 13.2*  --  20.8* 15.9* 10.2*  HGB 9.4* 7.7* 9.2* 8.8* 7.6* 7.4*  HCT 30.6* 24.5* 27.0* 27.7* 23.4* 23.3*  MCV 84.1 83.1  --  81.7 81.8 83.8  PLT 497* 427*  --  304 333 374   Cardiac Enzymes: No results for input(s): CKTOTAL, CKMB, CKMBINDEX, TROPONINI in the last 168 hours. BNP: BNP (last 3 results) No results for input(s): BNP in the last 8760 hours.  ProBNP (last 3 results) No results for input(s): PROBNP in the last 8760 hours.  CBG: Recent Labs  Lab 03/21/24 1536 03/21/24 2057 03/22/24 0613 03/22/24 1107 03/22/24 1614  GLUCAP 144* 141* 141* 114* 130*    Signed:  Colen Grimes MD   [facility] 03/22/2024, 5:15 PM

## 2024-03-22 NOTE — Plan of Care (Signed)

## 2024-03-22 NOTE — Progress Notes (Signed)
 Patient to discharge TOMORROW as long as remains stable hospitalist to round on patient-midline can come out and antibiotics can stop tomorrow Per surgery they feel source control is obtained so there is no concern about the leukocytosis from their perspective I discussed with patient and she feels comfortable with this plan and is happy to get home

## 2024-03-22 NOTE — Progress Notes (Signed)
 PT Cancellation Note  Patient Details Name: [name] MRN: [id number] DOB: November 27, 1980   Cancelled Treatment:    Reason Eval/Treat Not Completed: (P) Fatigue/lethargy limiting ability to participate (pt reports she just walked to bathroom, took a shower, now very fatigued. Pt and sister asking if she can get OT evaluation to assess for more home shower DME needs. Pt requesting pain meds, RN notified.) Will continue efforts per PT plan of care as schedule permits.   Connell HERO [name] 03/22/2024, 3:36 PM

## 2024-03-22 NOTE — Progress Notes (Signed)

## 2024-03-23 ENCOUNTER — Other Ambulatory Visit (HOSPITAL_COMMUNITY): Payer: Self-pay

## 2024-03-23 ENCOUNTER — Encounter: Payer: Self-pay | Admitting: Medical Oncology

## 2024-03-23 LAB — GLUCOSE, CAPILLARY
Glucose-Capillary: 174 mg/dL — ABNORMAL HIGH (ref 70–99)
Glucose-Capillary: 173 mg/dL — ABNORMAL HIGH (ref 70–99)
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
## 2024-03-23 NOTE — Progress Notes (Signed)
 TOC med delivered to bedside. DC RN at bedside with family. All belongings set on cart. Patient wheeled down with family and belongings with safe transfer to private vehicle.

## 2024-03-23 NOTE — Discharge Summary (Signed)
 Physician Discharge Summary  [name] FMW:[id number] DOB: [birth year] DOA: 03/13/2024  PCP: Georgina Speaks, FNP  Admit date: 03/13/2024 Discharge date: 03/23/2024  Admitted From: Home Disposition:  Home  Recommendations for Outpatient Follow-up:  Follow up with PCP in 1-2 weeks Wound care and management as an outpatient under care of Dr. Polly who will be CCed on this note-FMLA paperwork will be filled out by them Pain control provided at discharge does not need antibiotics going forward but close attention to wounds sister is a nurse and can manage Repeat labs in 1-2 weeks Outpatient follow-up in the next several weeks with oncology to rule out hepatoma and discuss options etc. and workup Medication changes outlined below  Home Health:None  Equipment/Devices:None  Discharge Condition: Stable CODE STATUS: Full Diet recommendation: Low-salt low-fat low-carb diet  Brief/Interim Summary: 43 year old black female with poorly controlled DM2, Presumed OSA, obesity hypoventilation syndrome with noted morbid obesity at BMI 51.  Patient noted to have large labial abscess on arrival, ultimately noted to be in shock likely multifactorial in the setting of sepsis due to large abscess with noted necrotizing fasciitis as well as concurrent hypovolemic shock secondary to blood loss during procedure given large wound.  Patient tolerated procedure quite well, broad-spectrum antibiotics narrowed as appropriate, general surgery and OB/GYN following, appreciate insight recommendations, underwent excisional I&D with repeat I&D required to ensure resolution of infection.  Noted moderate volume bleed from wound site requiring OR evaluation with quick clot and blood transfusion.  At this time given patient's improvement and resolution of symptoms she has been evaluated by surgical teams and recommended for discharge.  Would recommend repeat labs in the next 1 to 2 weeks to ensure resolution of anemia and no  further signs or symptoms of infection given her profound infection burden as above.  Patient otherwise at this time stable and agreeable for discharge home.  Multiple incidental findings during hospitalization including unspecified liver lesion, hypercalcemia which should be followed up in the outpatient setting with Dr. Loretha oncology.  Patient's other chronic comorbid conditions including but not limited to hypertension hyperkalemia, depression, and diabetes continue to be followed by PCP with no medication changes unless specified as below.  Discharge Diagnoses:  Principal Problem:   Necrotizing fasciitis (HCC) Active Problems:   DM2 (diabetes mellitus, type 2) (HCC)   History of essential hypertension   Depression with anxiety   Essential hypertension   Morbid obesity with body mass index (BMI) of 50.0 to 59.9 in adult [facility])   Type 2 diabetes mellitus with diabetic microalbuminuria, with long-term current use of insulin  (HCC)   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 [facility])   Hyperglycemia   Severe sepsis (HCC)   Hypercalcemia   Liver masses   Mild intermittent asthma    Discharge Instructions  Discharge Instructions     Amb Referral to Oncology Navigator   Complete by: As directed    Please select the type of referral navigator?: GI Comment - Dr. Zelma appt 2 weeks--put on AVS   DME Bedside commode   Complete by: As directed    Bariatric drop arm   Patient needs a bedside commode to treat with the following condition: Ambulatory dysfunction   Diet - low sodium heart healthy   Complete by: As directed    Discharge wound care:   Complete by: As directed    Please change left labia/thigh kerlex roll with wet to dry dressing daily and as needed with mesh underwear and ABD to cover Kerlex.  If  you alert the surgical PA on weekdays, we will evaluate the wound during dressing change.   Increase activity slowly   Complete by: As directed       Allergies as of 03/23/2024        Reactions   Fish Allergy Anaphylaxis   Other Anaphylaxis, Other (See Comments)   Pt has fish allergy Pt has allergy to butter cookies   Bactrim  [sulfamethoxazole -trimethoprim ] Diarrhea, Rash   Abdominal cramps        Medication List     STOP taking these medications    cephALEXin  500 MG capsule Commonly known as: KEFLEX    dapagliflozin  propanediol 10 MG Tabs tablet Commonly known as: Farxiga    diclofenac  75 MG EC tablet Commonly known as: VOLTAREN    fluconazole  100 MG tablet Commonly known as: DIFLUCAN    losartan -hydrochlorothiazide  50-12.5 MG tablet Commonly known as: HYZAAR   propranolol  10 MG tablet Commonly known as: INDERAL    Trulicity  1.5 MG/0.5ML Soaj Generic drug: Dulaglutide        TAKE these medications    Accu-Chek Guide test strip Generic drug: glucose blood 1 each by Other route daily in the afternoon. Use as instructed What changed: Another medication with the same name was added. Make sure you understand how and when to take each.   Accu-Chek Guide Test test strip Generic drug: glucose blood Use 3 (three) times daily as directed to check blood sugar. What changed: You were already taking a medication with the same name, and this prescription was added. Make sure you understand how and when to take each.   Accu-Chek Guide w/Device Kit Use 3 (three) times daily as directed   accu-chek multiclix lancets USE TO CHECK BLOOD SUGARS UP TO 4 TIMES DAILY  DX CODE:E11.9 What changed: Another medication with the same name was added. Make sure you understand how and when to take each.   Accu-Chek Softclix Lancets lancets Use 3 (three) times daily. Use as directed to check blood sugar. May dispense any manufacturer covered by patient's insurance and fits patient's device. What changed: You were already taking a medication with the same name, and this prescription was added. Make sure you understand how and when to take each.   albuterol  108 (90 Base)  MCG/ACT inhaler Commonly known as: VENTOLIN  HFA Inhale 1-2 puffs into the lungs every 6 (six) hours as needed for wheezing or shortness of breath. What changed: how much to take   atorvastatin  20 MG tablet Commonly known as: LIPITOR Take 1 tablet (20 mg total) by mouth daily.   Basaglar  KwikPen 100 UNIT/ML Inject 40 Units into the skin daily.   cetirizine  10 MG tablet Commonly known as: ZyrTEC  Allergy Take 1 tablet (10 mg total) by mouth daily. What changed:  when to take this reasons to take this   FeroSul 325 (65 Fe) MG tablet Generic drug: ferrous sulfate  Take 1 tablet (325 mg total) by mouth 2 (two) times daily with a meal.   fluticasone  50 MCG/ACT nasal spray Commonly known as: FLONASE  Place 1-2 sprays into both nostrils daily. What changed:  how much to take when to take this reasons to take this   Insulin  Pen Needle 31G X 8 MM Misc 1 Device by Does not apply route daily. What changed: Another medication with the same name was added. Make sure you understand how and when to take each.   TechLite Pen Needles 32G X 4 MM Misc Generic drug: Insulin  Pen Needle Use as directed 3 (three) times daily. What changed:  You were already taking a medication with the same name, and this prescription was added. Make sure you understand how and when to take each.   Lancet Device Misc Use 3 (three) times daily. May dispense any manufacturer covered by patient's insurance.   levonorgestrel  20 MCG/24HR IUD Commonly known as: MIRENA  1 each by Intrauterine route once.   metFORMIN  750 MG 24 hr tablet Commonly known as: GLUCOPHAGE -XR Take 2 tablets (1,500 mg total) by mouth daily with breakfast.   methocarbamol  500 MG tablet Commonly known as: ROBAXIN  Take 1 tablet (500 mg total) by mouth every 8 (eight) hours as needed for muscle spasms.   NovoLOG  FlexPen 100 UNIT/ML FlexPen Generic drug: insulin  aspart Inject 4 Units into the skin 3 (three) times daily with meals. If eating  and Blood Glucose (BG) 80 or higher inject 0 units for meal coverage and add correction dose per scale. If not eating, correction dose only. BG <150= 0 unit; BG 150-200= 1 unit; BG 201-250= 2 unit; BG 251-300= 3 unit; BG 301-350= 4 unit; BG 351-400= 5 unit; BG >400= 6 unit and Call Primary Care.   insulin  aspart 100 UNIT/ML injection Commonly known as: novoLOG  Inject 4 Units into the skin 3 (three) times daily with meals. If eating and Blood Glucose (BG) 80 or higher inject 0 units for meal coverage and add correction dose per scale. If not eating, correction dose only. BG <150= 0 unit; BG 150-200= 1 unit; BG 201-250= 2 unit; BG 251-300= 3 unit; BG 301-350= 4 unit; BG 351-400= 5 unit; BG >400= 6 unit and Call Primary Care.   omeprazole 20 MG capsule Commonly known as: PRILOSEC Take 20 mg by mouth daily.   oxyCODONE -acetaminophen  7.5-325 MG tablet Commonly known as: PERCOCET Take 2 tablets by mouth every 4 (four) hours as needed for moderate pain (pain score 4-6).   sertraline  50 MG tablet Commonly known as: ZOLOFT  Take 1 1/2 tablet by mouth daily   triamcinolone  0.025 % ointment Commonly known as: KENALOG  Apply 1 application topically 2 (two) times daily. What changed:  when to take this reasons to take this               Durable Medical Equipment  (From admission, onward)           Start     Ordered   03/23/24 1158  For home use only DME Walker rolling  Once       Question Answer Comment  Walker: With 5 Inch Wheels   Patient needs a walker to treat with the following condition Ambulatory dysfunction      03/23/24 1157   03/23/24 0000  DME Bedside commode       Comments: Bariatric drop arm  Question:  Patient needs a bedside commode to treat with the following condition  Answer:  Ambulatory dysfunction   03/23/24 1150              Discharge Care Instructions  (From admission, onward)           Start     Ordered   03/23/24 0000  Discharge wound care:        Comments: Please change left labia/thigh kerlex roll with wet to dry dressing daily and as needed with mesh underwear and ABD to cover Kerlex.  If you alert the surgical PA on weekdays, we will evaluate the wound during dressing change.   03/23/24 1150            Follow-up Information  Georgina Speaks, FNP Follow up.   Specialty: General Practice Contact information: [address] [place] [address] [phone]         Maczis, Puja Lyndon, [name]. Go on 04/08/2024.   Specialty: General Surgery Why: at 1:45 PM for post-operative follow up and wound check. please arrive 20-30 minutes early to your appointment. Contact information: [address] [place] [address] [phone]         [facility] Follow up.   Specialty: Home Health Services Why: HHRN arranged- they will contact you to schedule visits anticipate initial visit within 48hrs. Contact information: [address] [place] [address] [phone]         [facility] Follow up.   Why: (Adapt)- rolling walker to be delivered to pt prior to dischage, and Bariatric drop arm bedside commode- they will deliver to the home later today Contact information: [address] [place] [address] [phone]                Allergies  Allergen Reactions   Fish Allergy Anaphylaxis   Other Anaphylaxis and Other (See Comments)    Pt has fish allergy Pt has allergy to butter cookies   Bactrim  [Sulfamethoxazole -Trimethoprim ] Diarrhea and Rash    Abdominal cramps    Consultations: General surgery, OB/GYN  Procedures/Studies: CT PELVIS W CONTRAST Result Date: 03/17/2024 CLINICAL DATA:  Necrotizing fasciitis suspected EXAM: CT PELVIS WITH CONTRAST TECHNIQUE: Multidetector CT imaging of the pelvis was performed using the standard protocol following the bolus administration of intravenous contrast. RADIATION DOSE REDUCTION: This exam was  performed according to the departmental dose-optimization program which includes automated exposure control, adjustment of the mA and/or kV according to patient size and/or use of iterative reconstruction technique. CONTRAST:  75mL OMNIPAQUE  IOHEXOL  350 MG/ML SOLN COMPARISON:  CT abdomen pelvis March 13, 2024. FINDINGS: Urinary Tract:  Bladder is unremarkable. Bowel: Necrotizing fascitis/abscess formation centered within the left perineum with interval decrease in fluid component status post incision and drainage. There is persistent tract of air extending from the perianal region anteriorly to the left labia major and mons pubis with a large collection of subcutaneous air (post incision and drainage), now more confluent measuring 11 x 2.6 cm (3/138). Additional punctate foci of air extend to the left inguinal region (3/95).Compared to prior exam the fluid collection is significantly decreased common near completely resolved. Persistent diffuse subcutaneous fat stranding. Large stool throughout the colon. Visualized bowel loops are otherwise within normal limits. No bowel obstruction. Vascular/Lymphatic: Multiple prominent enhancing bilateral inguinal lymphadenopathy left greater than right likely reactive. Reproductive: Fibroid uterus. The left adnexal enhancing lesion measuring 2.8 by 3.7 cm containing a ring calcification measuring 17 mm may represent a pedunculated calcified fibroid versus an ovarian lesion. IUD in proper position in endometrial cavity. Other: Fat containing umbilical/paraumbilical multiloculated hernia. Partially visualized nodular liver Musculoskeletal: No suspicious osseous lesion IMPRESSION: Perineal/left labia major subcutaneous abscess formation with interval near complete resolution of fluid collection, status post incision and drainage. Residual subcutaneous emphysematous changes and fat stranding as detail above. Bilateral inguinal enhancing lymphadenopathy likely reactive.  Electronically Signed   By: Megan  Zare M.D.   On: 03/17/2024 18:29   CT FEMUR LEFT W CONTRAST Addendum Date: 03/17/2024 ADDENDUM REPORT: 03/17/2024 18:27 ADDENDUM: These results were called by telephone at the time of interpretation on 03/17/2024 at 6:27 pm to provider Dr. Royal, who verbally acknowledged these results. Electronically Signed   By: Amy  Maple M.D.   On: 03/17/2024 18:27   Result Date: 03/17/2024 CLINICAL DATA:  Necrotizing fasciitis suspected. EXAM: CT OF THE LOWER LEFT EXTREMITY WITH CONTRAST TECHNIQUE: Multidetector CT imaging of the lower left extremity was performed according to the standard protocol following intravenous contrast administration. RADIATION DOSE REDUCTION: This exam was performed according to the departmental dose-optimization program which includes automated exposure control, adjustment of the mA and/or kV according to patient size and/or use of iterative reconstruction technique. CONTRAST:  75mL OMNIPAQUE  IOHEXOL  350 MG/ML SOLN COMPARISON:  None Available. FINDINGS: Bones/Joint/Cartilage There is no acute fracture or dislocation. No focal osseous lesion or cortical erosion identified. No joint effusion identified of the left hip or left knee. Ligaments Suboptimally assessed by CT. Muscles and Tendons There is some deep fascial plane edema of the medial compartment of the upper left thigh with a few small foci of air best seen on image 4/102. No focal hematoma or fluid collection. No foreign body. Soft tissues There is soft tissue ulceration in the inferior left perineum involving subcutaneous tissues. There is surrounding subcutaneous edema and air without focal fluid collection identified. There is also subcutaneous edema and swelling of the left thigh with medial left thigh skin thickening. There is also some subcutaneous edema of the mons pubis. Small amount of air seen within the subcutaneous tissues of the left inguinal region as well. There are prominent  bilateral inguinal lymph nodes. IUD is noted in the uterus. IMPRESSION: 1. Deep fascial plane edema of the medial compartment of the upper left thigh with a few small foci of air. Findings are concerning for necrotizing fasciitis. 2. Soft tissue ulceration in the inferior left perineum. Subcutaneous edema and swelling of the proximal left thigh and mons pubis with associated scattered subcutaneous foci of air worrisome for cellulitis. 3. No acute osseous abnormality. Electronically Signed: By: Greig Maple M.D. On: 03/17/2024 18:13   MR LIVER W WO CONTRAST Result Date: 03/16/2024 EXAM: MRI ABDOMEN 03/15/2024 09:12:13 PM TECHNIQUE: Multiplanar multisequence MRI of the abdomen was performed with and without the administration of intravenous contrast. COMPARISON: 03/13/2024 CLINICAL HISTORY: Multiple liver lesions. FINDINGS: LIVER: The liver has a diffusely nodular contour compatible with cirrhosis. No arterial phase enhancing liver lesions identified. No abnormal areas of restricted diffusion within the periphery of segments 7. There is a 1 cm subcapsular T2 hyperintense lesion on axial image 17/5. This does not show enhancement on the postcontrast images. GALLBLADDER AND BILIARY SYSTEM: Gallbladder appears decompressed. SPLEEN: The spleen appears heterogeneous on the T2-weighted sequences and post-contrast sequences. The spleen measures 13.3 cm in craniocaudal dimension. Increased signal throughout the spleen on the out of phase sequences compatible with iron  deposition. PANCREAS: No pancreatic inflammation, mass or main duct dilatation. ADRENAL GLANDS: Unremarkable. KIDNEYS: No signs of hydronephrosis. There are 2 cysts within the right kidney measuring up to 1.5 cm. No follow up imaging recommended. LYMPH NODES: No abdominal adenopathy. VASCULATURE: Normal caliber of the abdominal aorta. PERITONEUM: No abdominal ascites. ABDOMINAL WALL: No hernia. No mass. BOWEL: Grossly unremarkable. No bowel obstruction.  BONES: No acute abnormality or worrisome osseous lesion. SOFT TISSUES: Unremarkable. IMPRESSION: 1. Diffusely nodular contour of the liver compatible with cirrhosis. No arterial phase enhancing liver lesions identified to suggest HCC. Recommend correlation with alpha-fetoprotein levels. Given the limitations of the current exam due to motion artifact and patient body habitus, if there is a high clinical concern for underlying hepatoma recommend repeat imaging with dedicated liver protocol CT. 2. 1 cm subcapsular T2 hyperintense lesion  in the periphery of segment 7, without postcontrast enhancement. Indeterminate. Consider follow up imaging in 3 to 6 months. 3. Mild splenomegaly with signs of diffuse iron  deposition. Electronically signed by: Waddell Calk MD 03/16/2024 08:47 AM EDT RP Workstation: [id number]   CT HEAD WO CONTRAST ( ) Result Date: 03/16/2024 CLINICAL DATA:  43 year old female admitted with perineum infection. Progressive headache. EXAM: CT HEAD WITHOUT CONTRAST TECHNIQUE: Contiguous axial images were obtained from the base of the skull through the vertex without intravenous contrast. RADIATION DOSE REDUCTION: This exam was performed according to the departmental dose-optimization program which includes automated exposure control, adjustment of the mA and/or kV according to patient size and/or use of iterative reconstruction technique. COMPARISON:  Head CT 11/10/2023. FINDINGS: Brain: Cerebral volume stable, within normal limits. No midline shift, ventriculomegaly, mass effect, evidence of mass lesion, intracranial hemorrhage or evidence of cortically based acute infarction. Gray-white matter differentiation is within normal limits throughout the brain. Vascular: No suspicious intracranial vascular hyperdensity. Skull: Stable and intact. Sinuses/Orbits: Visualized paranasal sinuses and mastoids are stable and well aerated. Other: Visualized orbits and scalp soft tissues are within normal limits.  IMPRESSION: Stable and normal noncontrast Head CT. Electronically Signed   By: VEAR Hurst M.D.   On: 03/16/2024 07:12   DG Chest Port 1 View Result Date: 03/14/2024 EXAM: 1 VIEW XRAY OF THE CHEST 03/14/2024 05:51:25 AM COMPARISON: PA and lateral radiographs of the chest dated 08/25/2021. CLINICAL HISTORY: Sepsis (Bilateral lower lobe airspace opacities seen on CT abd/pelvis, with incomplete visualization of the lungs) FINDINGS: LUNGS AND PLEURA: No focal pulmonary opacity. No pulmonary edema. No pleural effusion. No pneumothorax. HEART AND MEDIASTINUM: No acute abnormality of the cardiac and mediastinal silhouettes. BONES AND SOFT TISSUES: No acute osseous abnormality. IMPRESSION: 1. No acute findings. Electronically signed by: evalene coho 03/14/2024 09:46 AM EDT RP Workstation: [id number]   CT ABDOMEN PELVIS W CONTRAST Result Date: 03/13/2024 CLINICAL DATA:  Abdominal pain, acute, nonlocalized Large labial abscess Abscess on buttock, enlarged to upper thigh, first noticed 6 days ago. Hx fibroid, UTI, morbid obesity, cesarean section, and tubal ligation. EXAM: CT ABDOMEN AND PELVIS WITH CONTRAST TECHNIQUE: Multidetector CT imaging of the abdomen and pelvis was performed using the standard protocol following bolus administration of intravenous contrast. RADIATION DOSE REDUCTION: This exam was performed according to the departmental dose-optimization program which includes automated exposure control, adjustment of the mA and/or kV according to patient size and/or use of iterative reconstruction technique. CONTRAST:  OMNIPAQUE  IOHEXOL  300 MG/ML  SOLN COMPARISON:  CT renal 03/04/2022 FINDINGS: Lower chest: Partially visualized bilateral lower lobe cluster of pulmonary nodules/patchy airspace opacities. Hepatobiliary: Nodular hepatic contour with innumerable subcentimeter and pericentimeter hypodense lesions. No focal liver abnormality. No gallstones, gallbladder wall thickening, or pericholecystic fluid.  No biliary dilatation. Pancreas: No focal lesion. Normal pancreatic contour. No surrounding inflammatory changes. No main pancreatic ductal dilatation. Spleen: The spleen is enlarged in caliber measuring up to 15 cm. No focal lesion. Adrenals/Urinary Tract: No adrenal nodule bilaterally. Bilateral kidneys enhance symmetrically. No hydronephrosis. No hydroureter. Right nephrolithiasis measuring up to 3 mm. No left nephrolithiasis. No ureterolithiasis bilaterally. The urinary bladder is unremarkable. Stomach/Bowel: Stomach is within normal limits. No evidence of bowel wall thickening or dilatation. Appendix appears normal. Vascular/Lymphatic: No abdominal aorta or iliac aneurysm. Mild atherosclerotic plaque of the aorta and its branches. No abdominal, pelvic, or inguinal lymphadenopathy. Reproductive: T-shaped intrauterine vice within the lower uterine segment. Redemonstration of a stable peripherally calcified left adnexal region mass that may represent a  fundal fibroid versus ovarian lesion. Otherwise uterus and bilateral adnexa are unremarkable. Other: No intraperitoneal free fluid. No intraperitoneal free gas. No organized fluid collection. Musculoskeletal: Diffuse left labial subcutaneus soft tissue edema and emphysema. No organized fluid collection. Finding extends to the inferior portion of the mons pubis and superior most medial left thigh. Overlying dermal thickening. No suspicious lytic or blastic osseous lesions. No acute displaced fracture. Multilevel degenerative changes of the spine. IMPRESSION: 1. Diffuse left labial subcutaneus soft tissue edema and emphysema. No organized fluid collection. Finding extends to the inferior portion of the mons pubis and superior most medial left thigh. Overlying dermal thickening. 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 Correlate clinically as this is a clinical diagnosis. 2. Partially visualized bilateral lower lobe cluster of pulmonary nodules/patchy airspace opacities.  Findings suggestive of infection/inflammation. Correlate with chest x-ray PA and lateral view for further evaluation. Other imaging findings of potential clinical significance: 1. Cirrhosis with portal hypertension. Innumerable underlying masses suggestive of hepatocellular carcinoma. No focal liver lesions identified. Please note that liver protocol enhanced MR and CT are the most sensitive tests for the screening detection of hepatocellular carcinoma in the high risk setting of cirrhosis. 2. 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 3. Nonobstructive right nephrolithiasis. Electronically Signed   By: Morgane  Naveau M.D.   On: 03/13/2024 18:33   DG Foot 2 Views Left Result Date: 02/25/2024 Please see detailed radiograph report in office note.    Subjective: No acute issues or events overnight denies nausea vomit diarrhea constipation headache fever chills chest pain   Discharge Exam: Vitals:   03/23/24 1120 03/23/24 1135  BP:    Pulse: (!) 140 99  Resp:    Temp:    SpO2:     Vitals:   03/23/24 0500 03/23/24 1100 03/23/24 1120 03/23/24 1135  BP:  (!) 151/88    Pulse:  93 (!) 140 99  Resp:  15    Temp:  97.8 F (36.6 C)    TempSrc:  Oral    SpO2:      Weight: (!) 142.7 kg     Height:        General: Pt is alert, awake, not in acute distress Cardiovascular: RRR, S1/S2 +, no rubs, no gallops Respiratory: CTA bilaterally, no wheezing, no rhonchi Abdominal: Soft, NT, ND, bowel sounds + Extremities: no edema, no cyanosis    The results of significant diagnostics from this hospitalization (including imaging, microbiology, ancillary and laboratory) are listed below for reference.     Microbiology: Recent Results (from the past 240 hours)  Culture, blood (x 2)     Status: None   Collection Time: 03/13/24  9:46 PM   Specimen: BLOOD  Result Value Ref Range Status   Specimen Description BLOOD BLOOD LEFT ARM  Final   Special Requests   Final    BOTTLES DRAWN  AEROBIC AND ANAEROBIC Blood Culture adequate volume   Culture   Final    NO GROWTH 5 DAYS Performed at [facility], [address], [place], [place] [address]    Report Status 03/18/2024 FINAL  Final  Culture, blood (x 2)     Status: None   Collection Time: 03/13/24  9:46 PM   Specimen: BLOOD  Result Value Ref Range Status   Specimen Description BLOOD BLOOD LEFT HAND  Final   Special Requests   Final    BOTTLES DRAWN AEROBIC AND ANAEROBIC Blood Culture adequate volume   Culture   Final  NO GROWTH 5 DAYS Performed at [facility], [address] [place], [place] [address]    Report Status 03/18/2024 FINAL  Final  Aerobic/Anaerobic Culture w Gram Stain (surgical/deep wound)     Status: None   Collection Time: 03/13/24 11:02 PM   Specimen: Wound; Abscess  Result Value Ref Range Status   Specimen Description WOUND  Final   Special Requests NONE  Final   Gram Stain   Final    FEW WBC PRESENT, PREDOMINANTLY PMN ABUNDANT GRAM NEGATIVE RODS MODERATE GRAM POSITIVE COCCI    Culture   Final    FEW STREPTOCOCCUS ANGINOSIS SUSCEPTIBILITIES PERFORMED ON PREVIOUS CULTURE WITHIN THE LAST 5 DAYS. ABUNDANT PREVOTELLA BIVIA BETA LACTAMASE POSITIVE Performed at [facility], [address] [place], [place] [address]    Report Status 03/18/2024 FINAL  Final  Aerobic/Anaerobic Culture w Gram Stain (surgical/deep wound)     Status: None   Collection Time: 03/13/24 11:09 PM   Specimen: Wound; Tissue  Result Value Ref Range Status   Specimen Description WOUND  Final   Special Requests NONE  Final   Gram Stain   Final    FEW WBC PRESENT, PREDOMINANTLY PMN ABUNDANT GRAM NEGATIVE RODS MODERATE GRAM POSITIVE COCCI    Culture   Final    FEW STREPTOCOCCUS ANGINOSIS ABUNDANT PREVOTELLA BIVIA BETA LACTAMASE POSITIVE Performed at [facility], [address] [place], [place] [address]    Report Status 03/17/2024 FINAL  Final   Organism ID, Bacteria STREPTOCOCCUS  ANGINOSIS  Final      Susceptibility   Streptococcus anginosis - MIC*    PENICILLIN <=0.06 SENSITIVE Sensitive     CEFTRIAXONE  <=0.12 SENSITIVE Sensitive     ERYTHROMYCIN 2 RESISTANT Resistant     LEVOFLOXACIN  1 SENSITIVE Sensitive     VANCOMYCIN  0.5 SENSITIVE Sensitive     * FEW STREPTOCOCCUS ANGINOSIS  Urine Culture (for pregnant, neutropenic or urologic patients or patients with an indwelling urinary catheter)     Status: None   Collection Time: 03/14/24 11:17 PM   Specimen: Urine, Clean Catch  Result Value Ref Range Status   Specimen Description URINE, CLEAN CATCH  Final   Special Requests NONE  Final   Culture   Final    NO GROWTH Performed at [facility], [address], [place], [place] [address]    Report Status 03/17/2024 FINAL  Final     Labs: BNP (last 3 results) No results for input(s): BNP in the last 8760 hours. Basic Metabolic Panel: Recent Labs  Lab 03/17/24 0936 03/18/24 1414 03/19/24 0504 03/20/24 0105 03/20/24 0317 03/21/24 0501  NA 140 137 136 141 140 138  K 4.3 4.7 5.0 4.8 5.0 4.8  CL 106 106 102 106 109 105  CO2 25 25 26   --  24 25  GLUCOSE 147* 124* 300* 250* 278* 223*  BUN 12 11 17  23* 24* 21*  CREATININE 0.58 0.67 0.82 1.00 0.92 0.75  CALCIUM  10.1 9.5 10.3  --  9.3 8.8*  MG 1.5* 2.0 1.9  --   --   --    Liver Function Tests: Recent Labs  Lab 03/17/24 0936 03/18/24 1414 03/19/24 0504 03/20/24 0317 03/21/24 0501  AST 43* 84* 37 32 18  ALT 36 69* 59* 37 28  ALKPHOS 293* 364* 351* 225* 185*  BILITOT 0.7 0.5 0.7 0.8 0.5  PROT 6.2* 6.4* 6.9 4.9* 5.0*  ALBUMIN  1.8* 1.9* 2.0* 1.5* 1.7*   No results  for input(s): LIPASE, AMYLASE in the last 168 hours. No results for input(s): AMMONIA in the last 168 hours. CBC: Recent Labs  Lab 03/19/24 0504 03/19/24 1623 03/20/24 0105 03/20/24 0317 03/21/24 0501 03/22/24 1034  WBC 16.7* 16.2*  --  23.9* 16.9* 13.4*  NEUTROABS 14.1* 13.2*  --  20.8* 15.9* 10.2*  HGB 9.4* 7.7* 9.2*  8.8* 7.6* 7.4*  HCT 30.6* 24.5* 27.0* 27.7* 23.4* 23.3*  MCV 84.1 83.1  --  81.7 81.8 83.8  PLT 497* 427*  --  304 333 374   Cardiac Enzymes: No results for input(s): CKTOTAL, CKMB, CKMBINDEX, TROPONINI in the last 168 hours. BNP: Invalid input(s): POCBNP CBG: Recent Labs  Lab 03/22/24 1107 03/22/24 1614 03/22/24 2134 03/23/24 0628 03/23/24 1147  GLUCAP 114* 130* 161* 174* 173*   D-Dimer No results for input(s): DDIMER in the last 72 hours. Hgb A1c No results for input(s): HGBA1C in the last 72 hours. Lipid Profile No results for input(s): CHOL, HDL, LDLCALC, TRIG, CHOLHDL, LDLDIRECT in the last 72 hours. Thyroid function studies No results for input(s): TSH, T4TOTAL, T3FREE, THYROIDAB in the last 72 hours.  Invalid input(s): FREET3 Anemia work up No results for input(s): VITAMINB12, FOLATE, FERRITIN, TIBC, IRON , RETICCTPCT in the last 72 hours. Urinalysis    Component Value Date/Time   COLORURINE AMBER (A) 03/14/2024 0754   APPEARANCEUR HAZY (A) 03/14/2024 0754   LABSPEC 1.025 03/14/2024 0754   PHURINE 5.0 03/14/2024 0754   GLUCOSEU >=500 (A) 03/14/2024 0754   HGBUR LARGE (A) 03/14/2024 0754   BILIRUBINUR NEGATIVE 03/14/2024 0754   BILIRUBINUR small (A) 05/04/2023 1642   BILIRUBINUR Negative 04/28/2022 1605   KETONESUR NEGATIVE 03/14/2024 0754   PROTEINUR 100 (A) 03/14/2024 0754   UROBILINOGEN 1.0 05/04/2023 1642   UROBILINOGEN 0.2 06/18/2015 1259   NITRITE NEGATIVE 03/14/2024 0754   LEUKOCYTESUR LARGE (A) 03/14/2024 0754   Sepsis Labs Recent Labs  Lab 03/19/24 1623 03/20/24 0317 03/21/24 0501 03/22/24 1034  WBC 16.2* 23.9* 16.9* 13.4*   Microbiology Recent Results (from the past 240 hours)  Culture, blood (x 2)     Status: None   Collection Time: 03/13/24  9:46 PM   Specimen: BLOOD  Result Value Ref Range Status   Specimen Description BLOOD BLOOD LEFT ARM  Final   Special Requests   Final    BOTTLES  DRAWN AEROBIC AND ANAEROBIC Blood Culture adequate volume   Culture   Final    NO GROWTH 5 DAYS Performed at [facility], [address] [place], [place] [address]    Report Status 03/18/2024 FINAL  Final  Culture, blood (x 2)     Status: None   Collection Time: 03/13/24  9:46 PM   Specimen: BLOOD  Result Value Ref Range Status   Specimen Description BLOOD BLOOD LEFT HAND  Final   Special Requests   Final    BOTTLES DRAWN AEROBIC AND ANAEROBIC Blood Culture adequate volume   Culture   Final    NO GROWTH 5 DAYS Performed at [facility], [address], [place], [place] [address]    Report Status 03/18/2024 FINAL  Final  Aerobic/Anaerobic Culture w Gram Stain (surgical/deep wound)     Status: None   Collection Time: 03/13/24 11:02 PM   Specimen: Wound; Abscess  Result Value Ref Range Status   Specimen Description WOUND  Final   Special Requests NONE  Final   Gram Stain   Final    FEW WBC PRESENT, PREDOMINANTLY PMN  ABUNDANT GRAM NEGATIVE RODS MODERATE GRAM POSITIVE COCCI    Culture   Final    FEW STREPTOCOCCUS ANGINOSIS SUSCEPTIBILITIES PERFORMED ON PREVIOUS CULTURE WITHIN THE LAST 5 DAYS. ABUNDANT PREVOTELLA BIVIA BETA LACTAMASE POSITIVE Performed at [facility], [address], [place], [place] [address]    Report Status 03/18/2024 FINAL  Final  Aerobic/Anaerobic Culture w Gram Stain (surgical/deep wound)     Status: None   Collection Time: 03/13/24 11:09 PM   Specimen: Wound; Tissue  Result Value Ref Range Status   Specimen Description WOUND  Final   Special Requests NONE  Final   Gram Stain   Final    FEW WBC PRESENT, PREDOMINANTLY PMN ABUNDANT GRAM NEGATIVE RODS MODERATE GRAM POSITIVE COCCI    Culture   Final    FEW STREPTOCOCCUS ANGINOSIS ABUNDANT PREVOTELLA BIVIA BETA LACTAMASE POSITIVE Performed at [facility], [address] [place], [place] [address]    Report Status 03/17/2024 FINAL  Final   Organism ID, Bacteria  STREPTOCOCCUS ANGINOSIS  Final      Susceptibility   Streptococcus anginosis - MIC*    PENICILLIN <=0.06 SENSITIVE Sensitive     CEFTRIAXONE  <=0.12 SENSITIVE Sensitive     ERYTHROMYCIN 2 RESISTANT Resistant     LEVOFLOXACIN  1 SENSITIVE Sensitive     VANCOMYCIN  0.5 SENSITIVE Sensitive     * FEW STREPTOCOCCUS ANGINOSIS  Urine Culture (for pregnant, neutropenic or urologic patients or patients with an indwelling urinary catheter)     Status: None   Collection Time: 03/14/24 11:17 PM   Specimen: Urine, Clean Catch  Result Value Ref Range Status   Specimen Description URINE, CLEAN CATCH  Final   Special Requests NONE  Final   Culture   Final    NO GROWTH Performed at [facility], [address], [place], [place] [address]    Report Status 03/17/2024 FINAL  Final     Time coordinating discharge: Over 30 minutes  SIGNED:   Elsie JAYSON Montclair, DO [facility] 03/23/2024, 4:21 PM Pager   If 7PM-7AM, please contact night-coverage [email or web address]

## 2024-03-23 NOTE — Progress Notes (Signed)
 Progress Note  4 Days Post-Op  Subjective: Patient reports pain well controlled. Has been using bedside commode. Feels confident that with the help of her sister her wound care will be sufficient at home. No new concerns.   ROS  All negative with the exception of above.  Objective: Vital signs in last 24 hours: Temp:  [97.5 F (36.4 C)-97.6 F (36.4 C)] 97.5 F (36.4 C) (08/20 0035) Resp:  [18-22] 18 (08/20 0035) BP: (117-122)/(71-78) 122/78 (08/20 0035) SpO2:  [96 %] 96 % (08/20 0035) Weight:  [142.7 kg] 142.7 kg (08/20 0500) Last BM Date : 03/20/24  Intake/Output from previous day: No intake/output data recorded. Intake/Output this shift: Total I/O In: 240 [P.O.:240] Out: -   PE: General: Pleasant, female who is laying in bed in NAD. Lungs: Respiratory effort nonlabored GU: Patient would like to time pain medication with next dressing change. Will defer change to nursing staff. Psych: A&Ox3 with an appropriate affect.    Lab Results:  Recent Labs    03/21/24 0501 03/22/24 1034  WBC 16.9* 13.4*  HGB 7.6* 7.4*  HCT 23.4* 23.3*  PLT 333 374   BMET Recent Labs    03/21/24 0501  NA 138  K 4.8  CL 105  CO2 25  GLUCOSE 223*  BUN 21*  CREATININE 0.75  CALCIUM  8.8*   PT/INR No results for input(s): LABPROT, INR in the last 72 hours. CMP     Component Value Date/Time   NA 138 03/21/2024 0501   NA 136 03/10/2024 1458   K 4.8 03/21/2024 0501   CL 105 03/21/2024 0501   CO2 25 03/21/2024 0501   GLUCOSE 223 (H) 03/21/2024 0501   BUN 21 (H) 03/21/2024 0501   BUN 33 (H) 03/10/2024 1458   CREATININE 0.75 03/21/2024 0501   CALCIUM  8.8 (L) 03/21/2024 0501   CALCIUM  10.4 (H) 03/15/2024 1137   PROT 5.0 (L) 03/21/2024 0501   PROT 7.5 11/02/2023 0933   ALBUMIN  1.7 (L) 03/21/2024 0501   ALBUMIN  3.8 (L) 11/02/2023 0933   AST 18 03/21/2024 0501   ALT 28 03/21/2024 0501   ALKPHOS 185 (H) 03/21/2024 0501   BILITOT 0.5 03/21/2024 0501   BILITOT 0.5  11/02/2023 0933   GFRNONAA >60 03/21/2024 0501   GFRAA 108 08/28/2020 1735   Lipase     Component Value Date/Time   LIPASE 46 05/15/2021 1058       Studies/Results: No results found.  Anti-infectives: Anti-infectives (From admission, onward)    Start     Dose/Rate Route Frequency Ordered Stop   03/19/24 1415  fluconazole  (DIFLUCAN ) tablet 150 mg        150 mg Oral  Once 03/19/24 1329 03/19/24 1458   03/17/24 1600  Ampicillin -Sulbactam (UNASYN ) 3 g in sodium chloride  0.9 % 100 mL IVPB        3 g 200 mL/hr over 30 Minutes Intravenous Every 6 hours 03/17/24 1456     03/16/24 1215  cefTRIAXone  (ROCEPHIN ) 2 g in sodium chloride  0.9 % 100 mL IVPB  Status:  Discontinued        2 g 200 mL/hr over 30 Minutes Intravenous Daily 03/16/24 1124 03/17/24 1456   03/14/24 1000  meropenem  (MERREM ) 1 g in sodium chloride  0.9 % 100 mL IVPB  Status:  Discontinued        1 g 33.3 mL/hr over 180 Minutes Intravenous Every 8 hours 03/13/24 2140 03/16/24 1124   03/13/24 2200  linezolid  (ZYVOX ) IVPB 600 mg  Status:  Discontinued        600 mg 300 mL/hr over 60 Minutes Intravenous Every 12 hours 03/13/24 2055 03/16/24 1124   03/13/24 2130  meropenem  (MERREM ) 1 g in sodium chloride  0.9 % 100 mL IVPB        1 g 33.3 mL/hr over 180 Minutes Intravenous  Once 03/13/24 2055 03/14/24 0455   03/13/24 1900  clindamycin  (CLEOCIN ) IVPB 600 mg        600 mg 100 mL/hr over 30 Minutes Intravenous  Once 03/13/24 1859 03/14/24 1608   03/13/24 1730  vancomycin  (VANCOCIN ) IVPB 1000 mg/200 mL premix       Placed in Followed by Linked Group   1,000 mg 200 mL/hr over 60 Minutes Intravenous  Once 03/13/24 1625 03/13/24 2010   03/13/24 1730  ceFEPIme  (MAXIPIME ) 2 g in sodium chloride  0.9 % 100 mL IVPB        2 g 200 mL/hr over 30 Minutes Intravenous  Once 03/13/24 1728 03/13/24 1835   03/13/24 1630  vancomycin  (VANCOCIN ) IVPB 1000 mg/200 mL premix       Placed in Followed by Linked Group   1,000 mg 200 mL/hr over  60 Minutes Intravenous  Once 03/13/24 1625 03/13/24 1751        Assessment/Plan POD 9/7/5/4 s/p excisional debridement of NSTI of left labia/perineum - Additional abscess cavity opened in OR 8/15, Infection controlled. - Taken back to OR 8/16 PM after evaluation at bedside for bleeding from her wound. - Hemoglobin 7.4 8/19 - WBC 13.4 on 8/19 from 16.9 - Continue twice daily moist-to-dry dressing changes. Patients sister will assist with wound care. Home health RN has also been ordered. - Follow up arranged for wound check. Patient has a work note for 4 weeks out. She might need longer depending on the status of her wound (She is a Engineer, civil (consulting)). FMLA instructions reviewed with patient. - No additional abx needed at discharge. Has been on IV Unasyn . - Stable for discharge from general surgery standpoint. Please call for additional concerns or questions.   - per TR[facility] -  Morbid obesity - BMI 50.33 T2DM - A1c on 8/7 8.8 HTN Mild intermittent asthma Liver masses and pulm nodules on CT    LOS: 10 days   I reviewed specialist notes, hospitalist notes, last 24 h vitals and pain scores, last 48 h intake and output, last 24 h labs and trends, and last 24 h imaging results.   Marjorie Carlyon Favre, [facility] 03/23/2024, 10:19 AM Please see Amion for pager number during day hours 7:00am-4:30pm

## 2024-03-23 NOTE — TOC CM/SW Note (Signed)
 Due to limited mobility and chronic medical condition, patient is not able to walk the distance to go to the bathroom. A bedside commode will help prevent and reduce risk from falls.

## 2024-03-23 NOTE — Progress Notes (Signed)
 DC order noted per MD. DC RN at bedside. PIVs removed per primary RN. Pt up with PT/OT today. Bari-RW delivered to bedside. Patient confirms BSC delivered to patient home. AVS printed/reviewed with patient. Instructed patient to follow pharmacy instruction for insulin  administration and MD instruction for wound care. Supplies provided to the patient. Family assisted patient with dressing. Patient is ready wanting to finish lunch. Front desk informed of plans and need to call volunteer transport with cart for belongings. Front desk also informed of need to have TOC meds picked up on discharge.
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
## 2024-03-23 NOTE — TOC Transition Note (Addendum)
 Transition of Care (TOC) - Discharge Note Rayfield Gobble RN, BSN Inpatient Care Management Unit 4E- RN Case Manager See Treatment Team for direct phone #   Patient Details  Name: [name] MRN: [id number] Date of Birth: Jul 24, 1981  Transition of Care [facility]) CM/SW Contact:  Gobble Rayfield Hurst, RN Phone Number: 03/23/2024, 12:23 PM   Clinical Narrative:    Plan for pt to discharge home today. HHRN order has been placed. MD to place DME orders based on therapy recommendations for RW and Bariatric drop arm BSC.   CM in to speak with pt at bedside- discussed transition needs. Per pt her sister will be main person to assist with wound care needs. She also has other family that will assist as needed.  Family to transport home.   Discussed HH- explained that [facility] RN will not come on a daily basis- will only be a couple of visits a week to assess wound and do drsg change while they are there. Pt voiced understanding.  List provided for [facility] choice - Per CMS guidelines from [employer] website with star ratings (copy placed in shadow chart)- pt voiced she does not have a preference as long as agency is in network with insurance and has staffing that can do wound care.  Address, phone # and PCP all confirmed.   Reviewed DME needs- pt confirmed need for RW and BSC- no preference for agency- is ok with home delivery if needed.   Pt asked about FMLA paperwork - advised her to f/u with either PCP or Surgeon's office regarding FMLA.   Call made to [facility] for [facility] needs- liaison confirmed they have nursing availability for wound care. [facility] will contact pt to schedule within 48hr of discharge.   DME referral sent to [facility] for RW-bari and BSC-bari drop arm- per liaison they do not have in house to deliver to pt prior to discharge and will have DME delivered to the home later today.  1240- update- pt request RW to be delivered to her prior to d/c as she does not feel safe to get home without  it due to stairs she has to navigate. [facility] updated and will deliver RW to pt at [facility] prior to discharge- [facility] will still be delivered to the home. - Bedside RN updated and has made pt aware.   No further CM needs noted.     Final next level of care: Home w Home Health Services Barriers to Discharge: Barriers Resolved   Patient Goals and CMS Choice Patient states their goals for this hospitalization and ongoing recovery are:: return home with family to assist CMS Medicare.gov Compare Post Acute Care list provided to:: Patient Choice offered to / list presented to : Patient      Discharge Placement                 Home w/ [facility]      Discharge Plan and Services Additional resources added to the After Visit Summary for     Discharge Planning Services: CM Consult Post Acute Care Choice: Durable Medical Equipment, Home Health          DME Arranged: Bedside commode, Walker rolling DME Agency: [facility] Date DME Agency Contacted: 03/23/24 Time DME Agency Contacted: 1221 Representative spoke with at DME Agency: Zack HH Arranged: RN HH Agency: [facility] Date [facility] Agency Contacted: 03/23/24 Time HH Agency Contacted: 1115 Representative spoke with at [facility] Agency: Darleene  Social Drivers of Health (SDOH) Interventions SDOH Screenings  Food Insecurity: No Food Insecurity (03/15/2024)  Housing: Low Risk  (03/15/2024)  Transportation Needs: No Transportation Needs (03/15/2024)  Utilities: At Risk (03/15/2024)  Depression (PHQ2-9): Medium Risk (03/10/2024)  Financial Resource Strain: High Risk (08/30/2020)  Physical Activity: Inactive (03/31/2019)  Stress: Stress Concern Present (03/31/2019)  Tobacco Use: Medium Risk (03/18/2024)     Readmission Risk Interventions    03/15/2024    3:15 PM  Readmission Risk Prevention Plan  Post Dischage Appt Complete  Medication Screening Complete  Transportation Screening Complete

## 2024-03-24 ENCOUNTER — Telehealth: Payer: Self-pay | Admitting: *Deleted

## 2024-03-24 DIAGNOSIS — Z599 Problem related to housing and economic circumstances, unspecified: Secondary | ICD-10-CM

## 2024-03-24 DIAGNOSIS — N764 Abscess of vulva: Secondary | ICD-10-CM

## 2024-03-24 NOTE — Transitions of Care (Post Inpatient/ED Visit) (Signed)
 03/24/2024  Name: [name] MRN: [id number] DOB: 14-Oct-1980  Today's TOC FU Call Status: Today's TOC FU Call Status:: Successful TOC FU Call Completed TOC FU Call Complete Date: 03/24/24 Patient's Name and Date of Birth confirmed.  Transition Care Management Follow-up Telephone Call Date of Discharge: 03/23/24 Discharge Facility: [name]) Type of Discharge: Inpatient Admission Primary Inpatient Discharge Diagnosis:: Necrotizing fasciitis How have you been since you were released from the hospital?: Same Any questions or concerns?: No  Items Reviewed: Did you receive and understand the discharge instructions provided?: Yes Medications obtained,verified, and reconciled?: Yes (Medications Reviewed) Any new allergies since your discharge?: No Dietary orders reviewed?: Yes Type of Diet Ordered:: low sodium heart healthy Do you have support at home?: Yes People in Home [RPT]: sibling(s), spouse Name of Support/Comfort Primary Source: Spouse, Godmother, Sister(RN) and children  Medications Reviewed Today: Medications Reviewed Today     Reviewed by [name] LABOR, RN (Registered Nurse) on 03/24/24 at [phone]  Med List Status: <None>   Medication Order Taking? Sig Documenting Provider Last Dose Status Informant  Accu-Chek Softclix Lancets lancets [id number] Yes Use 3 (three) times daily. Use as directed to check blood sugar. May dispense any manufacturer covered by patient's insurance and fits patient's device. Samtani, Jai-Gurmukh, MD  Active   albuterol  (VENTOLIN  HFA) [phone] Base) MCG/ACT inhaler [id number] Yes Inhale 1-2 puffs into the lungs every 6 (six) hours as needed for wheezing or shortness of breath. Wieters, Hallie C, [name]  Active Self, Pharmacy Records  atorvastatin  (LIPITOR) 20 MG tablet [id number] Yes Take 1 tablet (20 mg total) by mouth daily. Georgina Speaks, FNP  Active Self, Pharmacy Records           Med Note (CRUTHIS, CHLOE C   Mon Mar 14, 2024  8:20 AM)  Has not taken in a week per pt. Reason not provided.   Blood Glucose Monitoring Suppl (BLOOD GLUCOSE MONITOR SYSTEM) w/Device KIT [id number] Yes Use 3 (three) times daily as directed Samtani, Jai-Gurmukh, MD  Active   cetirizine  (ZYRTEC  ALLERGY) 10 MG tablet [id number] Yes Take 1 tablet (10 mg total) by mouth daily.  Patient taking differently: Take 10 mg by mouth daily as needed for allergies.   Georgina Speaks, FNP  Active Self, Pharmacy Records  ferrous sulfate  325 [phone] FE) MG tablet [id number] Yes Take 1 tablet (325 mg total) by mouth 2 (two) times daily with a meal. Samtani, Jai-Gurmukh, MD  Active   fluticasone  (FLONASE ) 50 MCG/ACT nasal spray [id number] Yes Place 1-2 sprays into both nostrils daily. Ghumman, Ramandeep, NP  Active Self, Pharmacy Records  glucose blood (ACCU-CHEK GUIDE) test strip [id number] Yes 1 each by Other route daily in the afternoon. Use as instructed Shamleffer, Ibtehal Jaralla, MD  Active Self, Pharmacy Records  Glucose Blood (BLOOD GLUCOSE TEST STRIPS) STRP [id number] Yes Use 3 (three) times daily as directed to check blood sugar. Samtani, Jai-Gurmukh, MD  Active   insulin  aspart (NOVOLOG  FLEXPEN) 100 UNIT/ML FlexPen [id number] Yes Inject 4 Units into the skin 3 (three) times daily with meals. If eating and Blood Glucose (BG) 80 or higher inject 0 units for meal coverage and add correction dose per scale. If not eating, correction dose only. BG <150= 0 unit; BG 150-200= 1 unit; BG 201-250= 2 unit; BG 251-300= 3 unit; BG 301-350= 4 unit; BG 351-400= 5 unit; BG >400= 6 unit and Call Primary Care. Samtani, Jai-Gurmukh, MD  Active   insulin  aspart (NOVOLOG ) 100 UNIT/ML injection [id number]  Inject  4 Units into the skin 3 (three) times daily with meals. If eating and Blood Glucose (BG) 80 or higher inject 0 units for meal coverage and add correction dose per scale. If not eating, correction dose only. BG <150= 0 unit; BG 150-200= 1 unit; BG 201-250= 2 unit; BG 251-300= 3 unit; BG 301-350= 4  unit; BG 351-400= 5 unit; BG >400= 6 unit and Call Primary Care.  Patient not taking: Reported on 03/24/2024   Samtani, Jai-Gurmukh, MD  Active   Insulin  Glargine (BASAGLAR  [facility]) 100 UNIT/ML [id number] Yes Inject 40 Units into the skin daily. Shamleffer, Donell Cardinal, MD  Active Self, Pharmacy Records  Insulin  Pen Needle 31G X 8 MM MISC [id number] Yes 1 Device by Does not apply route daily. Shamleffer, Donell Cardinal, MD  Active Self, Pharmacy Records  Insulin  Pen Needle 32G X 4 MM MISC [id number] Yes Use as directed 3 (three) times daily. Samtani, Jai-Gurmukh, MD  Active   Lancet Device MISC [id number] Yes Use 3 (three) times daily. May dispense any manufacturer covered by patient's insurance. Samtani, Jai-Gurmukh, MD  Active   Lancets (ACCU-CHEK MULTICLIX) lancets [id number] Yes USE TO CHECK BLOOD SUGARS UP TO 4 TIMES DAILY  DX CODE:E11.9 Shamleffer, Donell Cardinal, MD  Active Self, Pharmacy Records  levonorgestrel  (MIRENA ) 20 MCG/24HR IUD [id number] Yes 1 each by Intrauterine route once. [provider]  Active Self, Pharmacy Records           Med Note (CRUTHIS, CHLOE C   Mon Mar 14, 2024  8:21 AM) Still has this inserted.   metFORMIN  (GLUCOPHAGE -XR) 750 MG 24 hr tablet [id number] Yes Take 2 tablets (1,500 mg total) by mouth daily with breakfast. Shamleffer, Ibtehal Jaralla, MD  Active Self, Pharmacy Records  methocarbamol  (ROBAXIN ) 500 MG tablet [id number] Yes Take 1 tablet (500 mg total) by mouth every 8 (eight) hours as needed for muscle spasms. Samtani, Jai-Gurmukh, MD  Active   omeprazole (PRILOSEC) 20 MG capsule [id number] Yes Take 20 mg by mouth daily. [provider]  Active Self, Pharmacy Records  oxyCODONE -acetaminophen  (PERCOCET) 7.5-325 MG tablet [id number] Yes Take 2 tablets by mouth every 4 (four) hours as needed for moderate pain (pain score 4-6). Samtani, Jai-Gurmukh, MD  Active   sertraline  (ZOLOFT ) 50 MG tablet [id number] Yes Take 1 1/2 tablet by mouth daily Georgina Speaks, FNP  Active Self, Pharmacy Records  triamcinolone  (KENALOG ) 0.025 % ointment [id number] Yes Apply 1 application topically 2 (two) times daily.  Patient taking differently: Apply 1 application  topically daily as needed (rash).   Pura Brittle, NP  Active Self, Pharmacy Records            Home Care and Equipment/Supplies: Were Home Health Services Ordered?: Yes Name of Home Health Agency:: [facility] Has Agency set up a time to come to your home?: No EMR reviewed for Home Health Orders: Orders present/patient has not received call (refer to CM for follow-up) (RN contacted [facility] [phone], patient being processed and will contact to schedule [facility] today.) Any new equipment or medical supplies ordered?: Yes Name of Medical supply agency?: [facility] Were you able to get the equipment/medical supplies?: Yes Do you have any questions related to the use of the equipment/supplies?: No  Functional Questionnaire: Do you need assistance with bathing/showering or dressing?: Yes (Sister assists) Do you need assistance with meal preparation?: Yes Do you need assistance with eating?: No Do you have difficulty maintaining continence: No Do you need assistance with getting out of bed/getting out of a chair/moving?:  Yes (Family assists) Do you have difficulty managing or taking your medications?: No  Follow up appointments reviewed: PCP Follow-up appointment confirmed?: No (Patient will call to schedule) MD Provider Line Number:[phone] Given: No Specialist Hospital Follow-up appointment confirmed?: Yes Date of Specialist follow-up appointment?: 04/08/24 Follow-Up Specialty Provider:: [facility] Do you need transportation to your follow-up appointment?: No Do you understand care options if your condition(s) worsen?: Yes-patient verbalized understanding  SDOH Interventions Today    Flowsheet Row Most Recent Value  SDOH Interventions   Food Insecurity Interventions  Intervention Not Indicated  Housing Interventions Intervention Not Indicated  Transportation Interventions Intervention Not Indicated  Utilities Interventions Other (Comment)  [referral to [facility] SW]    Goals Addressed             This Visit's Progress    [facility] Transitions of Care (TOC) Care Plan       Problems:  Recent Hospitalization for treatment of Necrotizing Faciitis SDOH barrier: financial difficulties  Goal:  Over the next 30 days, the patient will not experience hospital readmission  Interventions:  Transitions of Care: Doctor Visits  - discussed the importance of doctor visits Contacted Health RN/OT/PT - Call to [facility] to verify orders received, [facility] will contact patient today for [facility] Post discharge activity limitations prescribed by provider reviewed Post-op wound/incision care reviewed with patient/caregiver Reviewed Signs and symptoms of infection Educated patient on options for medication delivery, patient will call and arrange medications to be transferred and delivered [facility] SW referral placed for assistance with financial difficulties Appointments reviewed, PCP office has contacted patient to schedule hospital follow up  Patient Self Care Activities:  Attend all scheduled provider appointments Call pharmacy for medication refills 3-7 days in advance of running out of medications Call provider office for new concerns or questions  Notify RN Care Manager of TOC call rescheduling needs Participate in Transition of Care Program/Attend TOC scheduled calls Take medications as prescribed    Plan:  Telephone follow up appointment with care management team member scheduled for:  03/31/24 at 3pm        Andrea Dimes RN, BSN [place]  [facility] RN Care Manager [phone]

## 2024-03-25 ENCOUNTER — Inpatient Hospital Stay: Attending: Physician Assistant | Admitting: Physician Assistant

## 2024-03-25 ENCOUNTER — Telehealth: Payer: Self-pay

## 2024-03-25 ENCOUNTER — Inpatient Hospital Stay

## 2024-03-25 ENCOUNTER — Encounter: Payer: Self-pay | Admitting: Medical Oncology

## 2024-03-25 ENCOUNTER — Encounter: Payer: Self-pay | Admitting: Physician Assistant

## 2024-03-25 VITALS — BP 127/74 | HR 104 | Temp 98.1°F | Resp 20 | Wt 309.2 lb

## 2024-03-25 DIAGNOSIS — K766 Portal hypertension: Secondary | ICD-10-CM | POA: Diagnosis not present

## 2024-03-25 DIAGNOSIS — Z87891 Personal history of nicotine dependence: Secondary | ICD-10-CM | POA: Diagnosis not present

## 2024-03-25 DIAGNOSIS — Z808 Family history of malignant neoplasm of other organs or systems: Secondary | ICD-10-CM | POA: Insufficient documentation

## 2024-03-25 DIAGNOSIS — K769 Liver disease, unspecified: Secondary | ICD-10-CM | POA: Insufficient documentation

## 2024-03-25 DIAGNOSIS — K5900 Constipation, unspecified: Secondary | ICD-10-CM | POA: Diagnosis not present

## 2024-03-25 DIAGNOSIS — D509 Iron deficiency anemia, unspecified: Secondary | ICD-10-CM | POA: Diagnosis not present

## 2024-03-25 DIAGNOSIS — R110 Nausea: Secondary | ICD-10-CM | POA: Insufficient documentation

## 2024-03-25 DIAGNOSIS — K7460 Unspecified cirrhosis of liver: Secondary | ICD-10-CM | POA: Insufficient documentation

## 2024-03-25 DIAGNOSIS — D649 Anemia, unspecified: Secondary | ICD-10-CM | POA: Insufficient documentation

## 2024-03-25 DIAGNOSIS — Z79899 Other long term (current) drug therapy: Secondary | ICD-10-CM | POA: Diagnosis not present

## 2024-03-25 DIAGNOSIS — Z8052 Family history of malignant neoplasm of bladder: Secondary | ICD-10-CM | POA: Diagnosis not present

## 2024-03-25 DIAGNOSIS — R978 Other abnormal tumor markers: Secondary | ICD-10-CM | POA: Diagnosis not present

## 2024-03-25 LAB — CBC WITH DIFFERENTIAL (CANCER CENTER ONLY)
Abs Immature Granulocytes: 0.37 K/uL — ABNORMAL HIGH (ref 0.00–0.07)
Basophils Absolute: 0.1 K/uL (ref 0.0–0.1)
Basophils Relative: 0 %
Eosinophils Absolute: 0.4 K/uL (ref 0.0–0.5)
Eosinophils Relative: 3 %
HCT: 25 % — ABNORMAL LOW (ref 36.0–46.0)
Hemoglobin: 7.8 g/dL — ABNORMAL LOW (ref 12.0–15.0)
Immature Granulocytes: 3 %
Lymphocytes Relative: 7 %
Lymphs Abs: 1.1 K/uL (ref 0.7–4.0)
MCH: 26.5 pg (ref 26.0–34.0)
MCHC: 31.2 g/dL (ref 30.0–36.0)
MCV: 85 fL (ref 80.0–100.0)
Monocytes Absolute: 1.1 K/uL — ABNORMAL HIGH (ref 0.1–1.0)
Monocytes Relative: 8 %
Neutro Abs: 11.6 K/uL — ABNORMAL HIGH (ref 1.7–7.7)
Neutrophils Relative %: 79 %
Platelet Count: 614 K/uL — ABNORMAL HIGH (ref 150–400)
RBC: 2.94 MIL/uL — ABNORMAL LOW (ref 3.87–5.11)
RDW: 20.5 % — ABNORMAL HIGH (ref 11.5–15.5)
WBC Count: 14.6 K/uL — ABNORMAL HIGH (ref 4.0–10.5)
nRBC: 2.1 % — ABNORMAL HIGH (ref 0.0–0.2)

## 2024-03-25 LAB — CMP (CANCER CENTER ONLY)
ALT: 43 U/L (ref 0–44)
AST: 35 U/L (ref 15–41)
Albumin: 2.9 g/dL — ABNORMAL LOW (ref 3.5–5.0)
Alkaline Phosphatase: 273 U/L — ABNORMAL HIGH (ref 38–126)
Anion gap: 7 (ref 5–15)
BUN: 9 mg/dL (ref 6–20)
CO2: 29 mmol/L (ref 22–32)
Calcium: 9.3 mg/dL (ref 8.9–10.3)
Chloride: 99 mmol/L (ref 98–111)
Creatinine: 0.57 mg/dL (ref 0.44–1.00)
GFR, Estimated: >60 mL/min (ref >60)
Glucose, Bld: 229 mg/dL — ABNORMAL HIGH (ref 70–99)
Potassium: 5 mmol/L (ref 3.5–5.1)
Sodium: 135 mmol/L (ref 135–145)
Total Bilirubin: 0.6 mg/dL (ref 0.0–1.2)
Total Protein: 6.5 g/dL (ref 6.5–8.1)

## 2024-03-25 LAB — IRON AND IRON BINDING CAPACITY (CC-WL,HP ONLY)
Iron: 27 ug/dL — ABNORMAL LOW (ref 28–170)
Saturation Ratios: 7 % — ABNORMAL LOW (ref 10.4–31.8)
TIBC: 370 ug/dL (ref 250–450)
UIBC: 343 ug/dL (ref 148–442)

## 2024-03-25 LAB — SEDIMENTATION RATE: Sed Rate: 96 mm/h — ABNORMAL HIGH (ref 0–22)

## 2024-03-25 LAB — C-REACTIVE PROTEIN: CRP: 12.4 mg/dL — ABNORMAL HIGH (ref <1.0)

## 2024-03-25 MED ORDER — ONDANSETRON HCL 8 MG PO TABS: 8.0000 mg | ORAL_TABLET | Freq: Three times a day (TID) | ORAL | 0 refills | Status: DC | PRN

## 2024-03-25 NOTE — Progress Notes (Signed)
 Complex Care Management Note Care Guide Note  03/25/2024 Name: [name] MRN: [id number] DOB: 23-May-1981   Complex Care Management Outreach Attempts: An unsuccessful telephone outreach was attempted today to offer the patient information about available complex care management services.  Follow Up Plan:  Additional outreach attempts will be made to offer the patient complex care management information and services.   Encounter Outcome:  No Answer  Maytal Mijangos [facility]  [facility] Resource Care Guide Direct Dial : [phone]  Fax: [phone] Website: [email or web address]

## 2024-03-25 NOTE — Progress Notes (Signed)
 Rapid Diagnostic [facility] [phone]   [phone]  INITIAL CONSULTATION:  Patient Care Team: Georgina Speaks, FNP as PCP - General (General Practice) Arnell Rockney PARAS, [facility] (Inactive) (Pharmacist) Shamleffer, Donell Cardinal, MD as Attending Physician (Endocrinology) Zina Jerilynn LABOR, MD as Consulting Physician (Obstetrics and Gynecology) Golden Forestine BROCKS, RN as Oncology Nurse Navigator (Medical Oncology) [name] LABOR, RN as VBCI Care Management  CHIEF COMPLAINTS/PURPOSE OF CONSULTATION:  Liver lesions  HISTORY OF PRESENTING ILLNESS:  [name] 43 y.o. female with medical history significant for T2DM, hypertension, uterine fibroids presents to the rapid diagnostic clinic for evaluation for recent CT imaging concerning liver lesions.   [name] was recently hospitalized for large labial abscess on arrival, ultimately noted to be in shock likely multifactorial in the setting of sepsis due to large abscess with noted necrotizing fasciitis. CT abdomen and pelvis was obtained that showed cirrhosis with portal hypertension. In addition innumerable underlying masses suggestive of hepatocellular carcinoma. MRI of the liver was obtained on 03/15/2024 findings consistent with cirrhosis and no arterial phase enhancing liver lesions. There was 1 cm subcapsular T2 lesion int he peripheral of segment 7. The MRI was limited due to motion artifact and patient body habitus.   On exam today, [name] reports that she is slowly recovering after hospital discharge.  She still struggles with fatigue which impacts her ADLs.  Her appetite is slowly recovering as well.  She does have some nausea that improved in the hospital when she was given IV Zofran .  She does not have any antiemetics at home to take.  She denies any vomiting episodes.  She denies any abdominal pain.  She does have constipation with hard stools and straining.  She has a bowel movement  every few days.  She has pain near her labial wound which is managed with Robaxin  and oxycodone  as needed.  She monthly menstrual cycles that are lasting 7 days and heavy.  She has no other signs of pain including hematochezia or melena.  Patient does have shortness of breath with exertion but none at rest.  She denies fevers, chills, night sweats, chest pain or cough.  She has no other complaints.  Rest of the 10 point ROS as below.  MEDICAL HISTORY:  Past Medical History:  Diagnosis Date   Abnormal Pap smear    f/u wnl   Asthma    DM2 (diabetes mellitus, type 2) (HCC)    DM2 (diabetes mellitus, type 2) (HCC) 09/13/2012   AIC 9.7 09-24-12 Referred to Dr. Jarold after consult with Dr. Darcel Appt. Scheduled  C. Armer, CNM, FNP    Fibroid    Gestational diabetes    Hypertension    Morbid obesity (HCC)    Sleep apnea    not on cpap, diagnosed when pregnant   Urinary tract infection     SURGICAL HISTORY: Past Surgical History:  Procedure Laterality Date   BREAST BIOPSY Right 07/24/2022   US  RT BREAST BX W LOC DEV 1ST LESION IMG BX SPEC US  GUIDE 07/24/2022 [facility] MAMMOGRAPHY   CESAREAN SECTION     DILATION AND CURETTAGE OF UTERUS     INCISION AND DRAINAGE OF WOUND N/A 03/13/2024   Procedure: IRRIGATION AND DEBRIDEMENT WOUND;  Surgeon: Polly Cordella LABOR, MD;  Location: [facility] OR;  Service: General;  Laterality: N/A;   INCISION AND DRAINAGE PERIRECTAL ABSCESS N/A 03/16/2024   Procedure: INCISION AND DRAINAGE, ABSCESS, PERIRECTAL;  Surgeon: Lyndel Deward PARAS, MD;  Location: [facility]  OR;  Service: General;  Laterality: N/A;   IRRIGATION AND DEBRIDEMENT OF NECROTIZING SOFT TISSUE INFECTION Left 03/18/2024   Procedure: EXAM UNDER ANESTHESIA; INCISION, DRAINAGE, AND DEBRIDEMENT OF NECROTIZING SOFT TISSUE INFECTION LEFT LABIA AND THIGH;  Surgeon: Stechschulte, Deward PARAS, MD;  Location: [facility] OR;  Service: General;  Laterality: Left;   RECTAL EXAM UNDER ANESTHESIA N/A 03/13/2024   Procedure: EXAM UNDER  ANESTHESIA,;  Surgeon: Polly Cordella LABOR, MD;  Location: [facility] OR;  Service: General;  Laterality: N/A;  I AND D left labia necrotizing infection   TUBAL LIGATION     WOUND EXPLORATION Left 03/19/2024   Procedure: EXPLORATION OF LEFT GROIN WOUND;  Surgeon: Dasie Leonor CROME, MD;  Location: [facility] OR;  Service: General;  Laterality: Left;  LEFT GROIN WOUND EXPLORATION    SOCIAL HISTORY: Social History   Socioeconomic History   Marital status: Legally Separated    Spouse name: Married   Number of children: N   Years of education: Not on file   Highest education level: Not on file  Occupational History   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    Comment: BTL  Other Topics Concern   Not on file  Social History Narrative   Currently [redacted] weeks pregnant with first child (boy) Due 04/16/11         Social Drivers of Health   Financial Resource Strain: High Risk (03/28/2024)   Overall Financial Resource Strain (CARDIA)    Difficulty of Paying Living Expenses: Hard  Food Insecurity: Food Insecurity Present (03/28/2024)   Hunger Vital Sign    Worried About Running Out of Food in the Last Year: Sometimes true    Ran Out of Food in the Last Year: Sometimes true  Transportation Needs: No Transportation Needs (03/28/2024)   PRAPARE - Administrator, Civil Service (Medical): No    Lack of Transportation (Non-Medical): No  Physical Activity: Inactive (03/31/2019)   Exercise Vital Sign    Days of Exercise per Week: 0 days    Minutes of Exercise per Session: 0 min  Stress:  Stress Concern Present (03/31/2019)   [employer] of Occupational Health - Occupational Stress Questionnaire    Feeling of Stress : To some extent  Social Connections: Not on file  Intimate Partner Violence: Not At Risk (03/28/2024)   Humiliation, Afraid, Rape, and Kick questionnaire    Fear of Current or Ex-Partner: No    Emotionally Abused: No    Physically Abused: No    Sexually Abused: No    FAMILY HISTORY: Family History  Problem Relation Age of Onset   Allergies Mother    Asthma Mother    Hypertension Mother    Diabetes Mother    Diabetes Father    Diabetes Maternal Grandmother    Heart disease Maternal Grandmother    Liver cancer Other    Liver cancer Other    Bladder Cancer Maternal Uncle    Anesthesia problems Neg Hx    Hypotension  Neg Hx    Malignant hyperthermia Neg Hx    Pseudochol deficiency Neg Hx    Hearing loss Neg Hx     ALLERGIES:  is allergic to farxiga  [dapagliflozin ], fish allergy, other, penicillins, and bactrim  [sulfamethoxazole -trimethoprim ].  MEDICATIONS:  Current Outpatient Medications  Medication Sig Dispense Refill   Accu-Chek Softclix Lancets lancets Use 3 (three) times daily. Use as directed to check blood sugar. May dispense any manufacturer covered by patient's insurance and fits patient's device. 100 each 0   albuterol  (VENTOLIN  HFA) 108 (90 Base) MCG/ACT inhaler Inhale 1-2 puffs into the lungs every 6 (six) hours as needed for wheezing or shortness of breath. 18 g 0   atorvastatin  (LIPITOR) 20 MG tablet Take 1 tablet (20 mg total) by mouth daily. 90 tablet 1   Blood Glucose Monitoring Suppl (BLOOD GLUCOSE MONITOR SYSTEM) w/Device KIT Use 3 (three) times daily as directed 1 kit 0   cetirizine  (ZYRTEC  ALLERGY) 10 MG tablet Take 1 tablet (10 mg total) by mouth daily. (Patient taking differently: Take 10 mg by mouth daily as needed for allergies.) 90 tablet 2   ferrous sulfate  325 (65 FE) MG tablet Take 1 tablet (325 mg total) by mouth 2  (two) times daily with a meal. 60 tablet 2   fluticasone  (FLONASE ) 50 MCG/ACT nasal spray Place 1-2 sprays into both nostrils daily. 16 g 0   glucose blood (ACCU-CHEK GUIDE) test strip 1 each by Other route daily in the afternoon. Use as instructed 100 each 3   Glucose Blood (BLOOD GLUCOSE TEST STRIPS) STRP Use 3 (three) times daily as directed to check blood sugar. 100 strip 0   insulin  aspart (NOVOLOG  FLEXPEN) 100 UNIT/ML FlexPen Inject 4 Units into the skin 3 (three) times daily with meals. If eating and Blood Glucose (BG) 80 or higher inject 0 units for meal coverage and add correction dose per scale. If not eating, correction dose only. BG <150= 0 unit; BG 150-200= 1 unit; BG 201-250= 2 unit; BG 251-300= 3 unit; BG 301-350= 4 unit; BG 351-400= 5 unit; BG >400= 6 unit and Call Primary Care. 9 mL 11   insulin  aspart (NOVOLOG ) 100 UNIT/ML injection Inject 4 Units into the skin 3 (three) times daily with meals. If eating and Blood Glucose (BG) 80 or higher inject 0 units for meal coverage and add correction dose per scale. If not eating, correction dose only. BG <150= 0 unit; BG 150-200= 1 unit; BG 201-250= 2 unit; BG 251-300= 3 unit; BG 301-350= 4 unit; BG 351-400= 5 unit; BG >400= 6 unit and Call Primary Care. 10 mL 0   Insulin  Glargine (BASAGLAR  KWIKPEN) 100 UNIT/ML Inject 40 Units into the skin daily. 45 mL 3   Insulin  Pen Needle 31G X 8 MM MISC 1 Device by Does not apply route daily. 100 each 2   Insulin  Pen Needle 32G X 4 MM MISC Use as directed 3 (three) times daily. 100 each 0   Lancet Device MISC Use 3 (three) times daily. May dispense any manufacturer covered by patient's insurance. 1 each 0   Lancets (ACCU-CHEK MULTICLIX) lancets USE TO CHECK BLOOD SUGARS UP TO 4 TIMES DAILY  DX CODE:E11.9 100 each 12   levonorgestrel  (MIRENA ) 20 MCG/24HR IUD 1 each by Intrauterine route once.     metFORMIN  (GLUCOPHAGE -XR) 750 MG 24 hr tablet Take 2 tablets (1,500 mg total) by mouth daily with breakfast. 180  tablet 2   methocarbamol  (ROBAXIN ) 500 MG tablet Take 1 tablet (500 mg  total) by mouth every 8 (eight) hours as needed for muscle spasms. 30 tablet 0   omeprazole (PRILOSEC) 20 MG capsule Take 20 mg by mouth daily.     ondansetron  (ZOFRAN ) 8 MG tablet Take 1 tablet (8 mg total) by mouth every 8 (eight) hours as needed for nausea or vomiting. 60 tablet 0   oxyCODONE -acetaminophen  (PERCOCET) 7.5-325 MG tablet Take 2 tablets by mouth every 4 (four) hours as needed for moderate pain (pain score 4-6). 30 tablet 0   sertraline  (ZOLOFT ) 50 MG tablet Take 1 1/2 tablet by mouth daily 145 tablet 1   triamcinolone  (KENALOG ) 0.025 % ointment Apply 1 application topically 2 (two) times daily. (Patient taking differently: Apply 1 application  topically daily as needed (rash).) 30 g 0   No current facility-administered medications for this visit.    REVIEW OF SYSTEMS:   Constitutional: ( - ) fevers, ( - )  chills , ( - ) night sweats Eyes: ( - ) blurriness of vision, ( - ) double vision, ( - ) watery eyes Ears, nose, mouth, throat, and face: ( - ) mucositis, ( - ) sore throat Respiratory: ( - ) cough, ( +) dyspnea, ( - ) wheezes Cardiovascular: ( - ) palpitation, ( - ) chest discomfort, ( - ) lower extremity swelling Gastrointestinal:  ( + ) nausea, ( - ) heartburn, (+ ) change in bowel habits Skin: ( - ) abnormal skin rashes Lymphatics: ( - ) new lymphadenopathy, ( - ) easy bruising Neurological: ( - ) numbness, ( - ) tingling, ( - ) new weaknesses Behavioral/Psych: ( - ) mood change, ( - ) new changes  All other systems were reviewed with the patient and are negative.  PHYSICAL EXAMINATION: ECOG PERFORMANCE STATUS: 2 - Symptomatic, <50% confined to bed  Vitals:   03/25/24 1417  BP: 127/74  Pulse: (!) 104  Resp: 20  Temp: 98.1 F (36.7 C)  SpO2: 98%   Filed Weights   03/25/24 1417  Weight: (!) 309 lb 3.2 oz (140.3 kg)    GENERAL: well appearing female in NAD. Patient was in wheelchair  during examination SKIN: skin color, texture, turgor are normal, no rashes or significant lesions EYES: conjunctiva are pink and non-injected, sclera clear LYMPH:  no palpable lymphadenopathy in the cervical or supraclavicular lymph nodes.  LUNGS: clear to auscultation and percussion with normal breathing effort HEART: regular rate & rhythm and no murmurs and no lower extremity edema ABDOMEN: soft, non-tender, non-distended, normal bowel sounds Musculoskeletal: no cyanosis of digits and no clubbing  PSYCH: alert & oriented x 3, fluent speech NEURO: no focal motor/sensory deficits  LABORATORY DATA:  I have reviewed the data as listed    Latest Ref Rng & Units 03/25/2024    3:16 PM 03/22/2024   10:34 AM 03/21/2024    5:01 AM  CBC  WBC 4.0 - 10.5 K/uL 14.6  13.4  16.9   Hemoglobin 12.0 - 15.0 g/dL 7.8  7.4  7.6   Hematocrit 36.0 - 46.0 % 25.0  23.3  23.4   Platelets 150 - 400 K/uL 614  374  333        Latest Ref Rng & Units 03/25/2024    3:16 PM 03/21/2024    5:01 AM 03/20/2024    3:17 AM  CMP  Glucose 70 - 99 mg/dL 770  776  721   BUN 6 - 20 mg/dL 9  21  24    Creatinine 0.44 - 1.00 mg/dL 9.42  0.75  0.92   Sodium 135 - 145 mmol/L 135  138  140   Potassium 3.5 - 5.1 mmol/L 5.0  4.8  5.0   Chloride 98 - 111 mmol/L 99  105  109   CO2 22 - 32 mmol/L 29  25  24    Calcium  8.9 - 10.3 mg/dL 9.3  8.8  9.3   Total Protein 6.5 - 8.1 g/dL 6.5  5.0  4.9   Total Bilirubin 0.0 - 1.2 mg/dL 0.6  0.5  0.8   Alkaline Phos 38 - 126 U/L 273  185  225   AST 15 - 41 U/L 35  18  32   ALT 0 - 44 U/L 43  28  37      RADIOGRAPHIC STUDIES: I have personally reviewed the radiological images as listed and agreed with the findings in the report. CT PELVIS W CONTRAST Result Date: 03/17/2024 CLINICAL DATA:  Necrotizing fasciitis suspected EXAM: CT PELVIS WITH CONTRAST TECHNIQUE: Multidetector CT imaging of the pelvis was performed using the standard protocol following the bolus administration of intravenous  contrast. RADIATION DOSE REDUCTION: This exam was performed according to the departmental dose-optimization program which includes automated exposure control, adjustment of the mA and/or kV according to patient size and/or use of iterative reconstruction technique. CONTRAST:  75mL OMNIPAQUE  IOHEXOL  350 MG/ML SOLN COMPARISON:  CT abdomen pelvis March 13, 2024. FINDINGS: Urinary Tract:  Bladder is unremarkable. Bowel: Necrotizing fascitis/abscess formation centered within the left perineum with interval decrease in fluid component status post incision and drainage. There is persistent tract of air extending from the perianal region anteriorly to the left labia major and mons pubis with a large collection of subcutaneous air (post incision and drainage), now more confluent measuring 11 x 2.6 cm (3/138). Additional punctate foci of air extend to the left inguinal region (3/95).Compared to prior exam the fluid collection is significantly decreased common near completely resolved. Persistent diffuse subcutaneous fat stranding. Large stool throughout the colon. Visualized bowel loops are otherwise within normal limits. No bowel obstruction. Vascular/Lymphatic: Multiple prominent enhancing bilateral inguinal lymphadenopathy left greater than right likely reactive. Reproductive: Fibroid uterus. The left adnexal enhancing lesion measuring 2.8 by 3.7 cm containing a ring calcification measuring 17 mm may represent a pedunculated calcified fibroid versus an ovarian lesion. IUD in proper position in endometrial cavity. Other: Fat containing umbilical/paraumbilical multiloculated hernia. Partially visualized nodular liver Musculoskeletal: No suspicious osseous lesion IMPRESSION: Perineal/left labia major subcutaneous abscess formation with interval near complete resolution of fluid collection, status post incision and drainage. Residual subcutaneous emphysematous changes and fat stranding as detail above. Bilateral inguinal  enhancing lymphadenopathy likely reactive. Electronically Signed   By: Megan  Zare M.D.   On: 03/17/2024 18:29   CT FEMUR LEFT W CONTRAST Addendum Date: 03/17/2024 ADDENDUM REPORT: 03/17/2024 18:27 ADDENDUM: These results were called by telephone at the time of interpretation on 03/17/2024 at 6:27 pm to provider Dr. Royal, who verbally acknowledged these results. Electronically Signed   By: Greig Pique M.D.   On: 03/17/2024 18:27   Result Date: 03/17/2024 CLINICAL DATA:  Necrotizing fasciitis suspected. EXAM: CT OF THE LOWER LEFT EXTREMITY WITH CONTRAST TECHNIQUE: Multidetector CT imaging of the lower left extremity was performed according to the standard protocol following intravenous contrast administration. RADIATION DOSE REDUCTION: This exam was performed according to the departmental dose-optimization program which includes automated exposure control, adjustment of the mA and/or kV according to patient size and/or use of iterative reconstruction technique. CONTRAST:  75mL OMNIPAQUE   IOHEXOL  350 MG/ML SOLN COMPARISON:  None Available. FINDINGS: Bones/Joint/Cartilage There is no acute fracture or dislocation. No focal osseous lesion or cortical erosion identified. No joint effusion identified of the left hip or left knee. Ligaments Suboptimally assessed by CT. Muscles and Tendons There is some deep fascial plane edema of the medial compartment of the upper left thigh with a few small foci of air best seen on image 4/102. No focal hematoma or fluid collection. No foreign body. Soft tissues There is soft tissue ulceration in the inferior left perineum involving subcutaneous tissues. There is surrounding subcutaneous edema and air without focal fluid collection identified. There is also subcutaneous edema and swelling of the left thigh with medial left thigh skin thickening. There is also some subcutaneous edema of the mons pubis. Small amount of air seen within the subcutaneous tissues of the left inguinal  region as well. There are prominent bilateral inguinal lymph nodes. IUD is noted in the uterus. IMPRESSION: 1. Deep fascial plane edema of the medial compartment of the upper left thigh with a few small foci of air. Findings are concerning for necrotizing fasciitis. 2. Soft tissue ulceration in the inferior left perineum. Subcutaneous edema and swelling of the proximal left thigh and mons pubis with associated scattered subcutaneous foci of air worrisome for cellulitis. 3. No acute osseous abnormality. Electronically Signed: By: Greig Pique M.D. On: 03/17/2024 18:13   MR LIVER W WO CONTRAST Result Date: 03/16/2024 EXAM: MRI ABDOMEN 03/15/2024 09:12:13 PM TECHNIQUE: Multiplanar multisequence MRI of the abdomen was performed with and without the administration of intravenous contrast. COMPARISON: 03/13/2024 CLINICAL HISTORY: Multiple liver lesions. FINDINGS: LIVER: The liver has a diffusely nodular contour compatible with cirrhosis. No arterial phase enhancing liver lesions identified. No abnormal areas of restricted diffusion within the periphery of segments 7. There is a 1 cm subcapsular T2 hyperintense lesion on axial image 17/5. This does not show enhancement on the postcontrast images. GALLBLADDER AND BILIARY SYSTEM: Gallbladder appears decompressed. SPLEEN: The spleen appears heterogeneous on the T2-weighted sequences and post-contrast sequences. The spleen measures 13.3 cm in craniocaudal dimension. Increased signal throughout the spleen on the out of phase sequences compatible with iron  deposition. PANCREAS: No pancreatic inflammation, mass or main duct dilatation. ADRENAL GLANDS: Unremarkable. KIDNEYS: No signs of hydronephrosis. There are 2 cysts within the right kidney measuring up to 1.5 cm. No follow up imaging recommended. LYMPH NODES: No abdominal adenopathy. VASCULATURE: Normal caliber of the abdominal aorta. PERITONEUM: No abdominal ascites. ABDOMINAL WALL: No hernia. No mass. BOWEL: Grossly  unremarkable. No bowel obstruction. BONES: No acute abnormality or worrisome osseous lesion. SOFT TISSUES: Unremarkable. IMPRESSION: 1. Diffusely nodular contour of the liver compatible with cirrhosis. No arterial phase enhancing liver lesions identified to suggest HCC. Recommend correlation with alpha-fetoprotein levels. Given the limitations of the current exam due to motion artifact and patient body habitus, if there is a high clinical concern for underlying hepatoma recommend repeat imaging with dedicated liver protocol CT. 2. 1 cm subcapsular T2 hyperintense lesion in the periphery of segment 7, without postcontrast enhancement. Indeterminate. Consider follow up imaging in 3 to 6 months. 3. Mild splenomegaly with signs of diffuse iron  deposition. Electronically signed by: Waddell Calk MD 03/16/2024 08:47 AM EDT RP Workstation: [id number]   CT HEAD WO CONTRAST ( ) Result Date: 03/16/2024 CLINICAL DATA:  43 year old female admitted with perineum infection. Progressive headache. EXAM: CT HEAD WITHOUT CONTRAST TECHNIQUE: Contiguous axial images were obtained from the base of the skull through the vertex without intravenous contrast.  RADIATION DOSE REDUCTION: This exam was performed according to the departmental dose-optimization program which includes automated exposure control, adjustment of the mA and/or kV according to patient size and/or use of iterative reconstruction technique. COMPARISON:  Head CT 11/10/2023. FINDINGS: Brain: Cerebral volume stable, within normal limits. No midline shift, ventriculomegaly, mass effect, evidence of mass lesion, intracranial hemorrhage or evidence of cortically based acute infarction. Gray-white matter differentiation is within normal limits throughout the brain. Vascular: No suspicious intracranial vascular hyperdensity. Skull: Stable and intact. Sinuses/Orbits: Visualized paranasal sinuses and mastoids are stable and well aerated. Other: Visualized orbits and scalp  soft tissues are within normal limits. IMPRESSION: Stable and normal noncontrast Head CT. Electronically Signed   By: VEAR Hurst M.D.   On: 03/16/2024 07:12   DG Chest Port 1 View Result Date: 03/14/2024 EXAM: 1 VIEW XRAY OF THE CHEST 03/14/2024 05:51:25 AM COMPARISON: PA and lateral radiographs of the chest dated 08/25/2021. CLINICAL HISTORY: Sepsis (Bilateral lower lobe airspace opacities seen on CT abd/pelvis, with incomplete visualization of the lungs) FINDINGS: LUNGS AND PLEURA: No focal pulmonary opacity. No pulmonary edema. No pleural effusion. No pneumothorax. HEART AND MEDIASTINUM: No acute abnormality of the cardiac and mediastinal silhouettes. BONES AND SOFT TISSUES: No acute osseous abnormality. IMPRESSION: 1. No acute findings. Electronically signed by: evalene coho 03/14/2024 09:46 AM EDT RP Workstation: [id number]   CT ABDOMEN PELVIS W CONTRAST Result Date: 03/13/2024 CLINICAL DATA:  Abdominal pain, acute, nonlocalized Large labial abscess Abscess on buttock, enlarged to upper thigh, first noticed 6 days ago. Hx fibroid, UTI, morbid obesity, cesarean section, and tubal ligation. EXAM: CT ABDOMEN AND PELVIS WITH CONTRAST TECHNIQUE: Multidetector CT imaging of the abdomen and pelvis was performed using the standard protocol following bolus administration of intravenous contrast. RADIATION DOSE REDUCTION: This exam was performed according to the departmental dose-optimization program which includes automated exposure control, adjustment of the mA and/or kV according to patient size and/or use of iterative reconstruction technique. CONTRAST:  OMNIPAQUE  IOHEXOL  300 MG/ML  SOLN COMPARISON:  CT renal 03/04/2022 FINDINGS: Lower chest: Partially visualized bilateral lower lobe cluster of pulmonary nodules/patchy airspace opacities. Hepatobiliary: Nodular hepatic contour with innumerable subcentimeter and pericentimeter hypodense lesions. No focal liver abnormality. No gallstones, gallbladder wall  thickening, or pericholecystic fluid. No biliary dilatation. Pancreas: No focal lesion. Normal pancreatic contour. No surrounding inflammatory changes. No main pancreatic ductal dilatation. Spleen: The spleen is enlarged in caliber measuring up to 15 cm. No focal lesion. Adrenals/Urinary Tract: No adrenal nodule bilaterally. Bilateral kidneys enhance symmetrically. No hydronephrosis. No hydroureter. Right nephrolithiasis measuring up to 3 mm. No left nephrolithiasis. No ureterolithiasis bilaterally. The urinary bladder is unremarkable. Stomach/Bowel: Stomach is within normal limits. No evidence of bowel wall thickening or dilatation. Appendix appears normal. Vascular/Lymphatic: No abdominal aorta or iliac aneurysm. Mild atherosclerotic plaque of the aorta and its branches. No abdominal, pelvic, or inguinal lymphadenopathy. Reproductive: T-shaped intrauterine vice within the lower uterine segment. Redemonstration of a stable peripherally calcified left adnexal region mass that may represent a fundal fibroid versus ovarian lesion. Otherwise uterus and bilateral adnexa are unremarkable. Other: No intraperitoneal free fluid. No intraperitoneal free gas. No organized fluid collection. Musculoskeletal: Diffuse left labial subcutaneus soft tissue edema and emphysema. No organized fluid collection. Finding extends to the inferior portion of the mons pubis and superior most medial left thigh. Overlying dermal thickening. No suspicious lytic or blastic osseous lesions. No acute displaced fracture. Multilevel degenerative changes of the spine. IMPRESSION: 1. Diffuse left labial subcutaneus soft tissue edema and  emphysema. No organized fluid collection. Finding extends to the inferior portion of the mons pubis and superior most medial left thigh. Overlying dermal thickening. 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 Correlate clinically as this is a clinical diagnosis. 2. Partially visualized bilateral lower lobe cluster of pulmonary  nodules/patchy airspace opacities. Findings suggestive of infection/inflammation. Correlate with chest x-ray PA and lateral view for further evaluation. Other imaging findings of potential clinical significance: 1. Cirrhosis with portal hypertension. Innumerable underlying masses suggestive of hepatocellular carcinoma. No focal liver lesions identified. Please note that liver protocol enhanced MR and CT are the most sensitive tests for the screening detection of hepatocellular carcinoma in the high risk setting of cirrhosis. 2. 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 3. Nonobstructive right nephrolithiasis. Electronically Signed   By: Morgane  Naveau M.D.   On: 03/13/2024 18:33    ASSESSMENT & PLAN [name] is a 43 y.o. female who presents to the rapid diagnostic clinic for evaluation of liver lesions.   #Liver lesions: --Noted on CT abdomen/pelvis from 03/15/2024. --MRI liver did not identify arterial phase enhancing liver lesions  to suggest HCC. There is an indeterminate 1 cm subcapsular T2 hyperintense lesion in the periphery of segment 7. --Labs today to check CBC, CMP along with tumor markers including CEA, CA19-9, chromogranin and AFP --Will order CT liver protocol to further evaluate previous liver findings --Will  consider need for biopsy based on upcoming CT findings  #Cirrhosis with portal hypertension: --New diagnosis so will send referral to GI to further manage.  #Nausea: --Will send prescription for zofran  8 mg q 8 hours as needed  #Constipation: --Likely secondary to medication induced (opioids) --Advised to take miralax daily and add senokot as needed.   #Anemia:  --Could be secondary to recent blood loss from her labial wound and menstrual cycle. --Will check iron  panel and inflammatory markers.    Orders Placed This Encounter  Procedures   CT LIVER ABDOMEN W WO CONTRAST    Standing Status:   Future    Expected Date:   03/28/2024     Expiration Date:   03/25/2025    If indicated for the ordered procedure, I authorize the administration of contrast media per Radiology protocol:   Yes    Does the patient have a contrast media/X-ray dye allergy?:   No    Is patient pregnant?:   No    Preferred imaging location?:   [facility]    If indicated for the ordered procedure, I authorize the administration of oral contrast media per Radiology protocol:   Yes   CBC with Differential (Cancer Center Only)    Standing Status:   Future    Number of Occurrences:   1    Expiration Date:   03/25/2025   CMP (Cancer Center only)    Standing Status:   Future    Number of Occurrences:   1    Expiration Date:   03/25/2025   Ferritin    Standing Status:   Future    Number of Occurrences:   1    Expiration Date:   03/25/2025   Iron  and Iron  Binding Capacity ([facility] only)    Standing Status:   Future    Number of Occurrences:   1    Expiration Date:   03/25/2025   Sedimentation rate    Standing Status:   Future    Number of Occurrences:   1    Expiration Date:   03/25/2025   C-reactive  protein    Standing Status:   Future    Number of Occurrences:   1    Expiration Date:   03/25/2025   AFP tumor marker    Standing Status:   Future    Number of Occurrences:   1    Expiration Date:   03/25/2025   CEA (Access)-[facility] ONLY    Standing Status:   Future    Number of Occurrences:   1    Expiration Date:   03/25/2025   Chromogranin A    Standing Status:   Future    Number of Occurrences:   1    Expiration Date:   03/25/2025   CA 19.9    Standing Status:   Future    Number of Occurrences:   1    Expiration Date:   03/25/2025    All questions were answered. The patient knows to call the clinic with any problems, questions or concerns.  I have spent a total of 60 minutes minutes of face-to-face and non-face-to-face time, preparing to see the patient, obtaining and/or reviewing separately obtained history, performing a medically  appropriate examination, counseling and educating the patient, ordering medications/tests/procedures, referring and communicating with other health care professionals, documenting clinical information in the electronic health record, independently interpreting results and communicating results to the patient, and care coordination.   Johnston Police, [name] Department of Hematology/Oncology [facility] Phone: [phone]  Patient was seen with Dr. Federico  I have read the above note and personally examined the patient. I agree with the assessment and plan as noted above.  Briefly [name] is a 43 year old female who presents for evaluation of abnormalities noted on liver exam.  The patient did recently have a liver MRI but it was a suboptimal scan.  There was some concern for possible liver lesions.  A CT liver protocol was recommended.  Additionally we can order tumor markers in order to rule out the presence of a liver tumor.  The patient has no prior history of liver disease and has not had any other systemic findings such as fevers, chills, sweats, nausea, vomiting or diarrhea.  She is not currently having any abdominal pain.  The patient voiced understanding of our findings and plan moving forward.   Norleen IVAR Federico, MD Department of Hematology/Oncology [facility] at [facility] Phone: [phone] Pager: [phone] Email: [email or web address]

## 2024-03-25 NOTE — Progress Notes (Signed)
 Rapid Diagnostic Clinic  Patient presented to clinic, with her Aunt, for her scheduled appointment with [name] Johnston. I introduced myself and provided them with my direct contact information. Patient and Aunt were both encouraged to call me with any questions/concerns they may have.  Stephanie KYM Raider, RN, BSN, [facility] Oncology Nurse Navigator, Rapid Diagnostic Clinic 03/25/2024 3:24 PM

## 2024-03-26 LAB — CANCER ANTIGEN 19-9: CA 19-9: 28 U/mL (ref 0–35)

## 2024-03-26 LAB — AFP TUMOR MARKER: AFP, Serum, Tumor Marker: 1.9 ng/mL (ref 0.0–6.4)

## 2024-03-27 ENCOUNTER — Ambulatory Visit: Payer: Self-pay | Admitting: Nurse Practitioner

## 2024-03-27 ENCOUNTER — Encounter: Payer: Self-pay | Admitting: Nurse Practitioner

## 2024-03-27 LAB — CHROMOGRANIN A: Chromogranin A (ng/mL): 209 ng/mL — ABNORMAL HIGH (ref 0.0–101.8)

## 2024-03-28 ENCOUNTER — Ambulatory Visit: Payer: Self-pay | Admitting: Nurse Practitioner

## 2024-03-28 ENCOUNTER — Other Ambulatory Visit: Payer: Self-pay | Admitting: Licensed Clinical Social Worker

## 2024-03-28 ENCOUNTER — Encounter: Payer: Self-pay | Admitting: Nurse Practitioner

## 2024-03-28 ENCOUNTER — Telehealth (HOSPITAL_COMMUNITY): Payer: Self-pay | Admitting: Physician Assistant

## 2024-03-28 DIAGNOSIS — D509 Iron deficiency anemia, unspecified: Secondary | ICD-10-CM | POA: Insufficient documentation

## 2024-03-28 DIAGNOSIS — D508 Other iron deficiency anemias: Secondary | ICD-10-CM

## 2024-03-28 LAB — FERRITIN: Ferritin: 276 ng/mL (ref 11–307)

## 2024-03-28 LAB — CEA (ACCESS): CEA (CHCC): 1.21 ng/mL (ref 0.00–5.00)

## 2024-03-28 NOTE — Patient Outreach (Signed)
 Complex Care Management   Visit Note  03/28/2024  Name:  [name] MRN: [id number] DOB: 1981-04-04  Situation: Referral received for Complex Care Management related to SDOH Barriers:  Food insecurity Financial Resource Strain I obtained verbal consent from Patient.  Visit completed with Patient  on the phone  Background:   Past Medical History:  Diagnosis Date   Abnormal Pap smear    f/u wnl   Asthma    DM2 (diabetes mellitus, type 2) (HCC)    DM2 (diabetes mellitus, type 2) (HCC) 09/13/2012   AIC 9.7 09-24-12 Referred to Dr. Jarold after consult with Dr. Darcel Appt. Scheduled  C. Armer, CNM, FNP    Fibroid    Gestational diabetes    Hypertension    Morbid obesity (HCC)    Sleep apnea    not on cpap, diagnosed when pregnant   Urinary tract infection     Assessment: Patient just had surgery and has been out of work for 8 weeks and had been getting assistance from her family and her sister lives minutes own the road will come and assist with wound dressing. . SW will email and mail the resources  for food pantries, utility assistance and educated on the [employer].    SDOH Interventions    Flowsheet Row Patient Outreach Telephone from 03/28/2024 in [place] POPULATION HEALTH DEPARTMENT Telephone from 03/24/2024 in  POPULATION HEALTH DEPARTMENT ED to Hosp-Admission (Discharged) from 03/13/2024 in [facility] 4E CV SURGICAL PROGRESSIVE CARE Office Visit from 03/10/2024 in [facility] Office Visit from 11/02/2023 in [facility] Office Visit from 05/04/2023 in [facility]  SDOH Interventions        Food Insecurity Interventions Community Resources Provided  [SW will mail the food pantry list and educated about the [employer] App] Intervention Not Indicated -- -- -- --  Housing Interventions Intervention Not Indicated Intervention Not Indicated -- -- --  --  Transportation Interventions Intervention Not Indicated Intervention Not Indicated -- -- -- --  Utilities Interventions [employer] Provided  [SW will make the resources list for utility bills] Other (Comment)  [referral to [employer] SW] [employer] Provided, Inpatient TOC -- -- --  Depression Interventions/Treatment  -- -- -- Medication Medication Counseling  Financial Strain Interventions Intervention Not Indicated -- -- -- -- --      Recommendation:   none  Follow Up Plan:   Telephone follow up appointment date/time:  04/13/2024 at 2:00 pm  Tobias CHARM Maranda HEDWIG, PhD [facility], [facility] Social Worker Direct Dial : [phone]  Fax: [phone]

## 2024-03-28 NOTE — Patient Instructions (Signed)
 Visit Information  Thank you for taking time to visit with me today. Please don't hesitate to contact me if I can be of assistance to you before our next scheduled appointment.  Our next appointment is by telephone on 04/13/2024 at 2:00 pm Please call the care guide team at [phone] if you need to cancel or reschedule your appointment.   Following is a copy of your care plan:   Goals Addressed             This Visit's Progress    BSW VBCI Social Work Care Plan       Problems:   Corporate treasurer , Food Insecurity , and utility bills due to being out of work   CSW Clinical Goal(s):   Over the next 2 weeks the Patient will explore community resource options for unmet needs related to [employer]  and [employer] .  Interventions:  SW will email and mail resources for food pantry and utility resources and educated patient on the [employer] app.   Patient Goals/Self-Care Activities:  Follow up with food pantry  for community food options.  Plan:   Telephone follow up appointment with care management team member scheduled for:  04/13/2024 at 2:00 pm        Please call the Suicide and Crisis Lifeline: 988 go to [facility] Urgent [facility] [address], [place] [phone]) call 911 if you are experiencing a Mental Health or Behavioral Health Crisis or need someone to talk to.  Patient verbalizes understanding of instructions and care plan provided today and agrees to view in MyChart. Active MyChart status and patient understanding of how to access instructions and care plan via MyChart confirmed with patient.     Stephanie CHARM Maranda HEDWIG, PhD [facility], [facility] Social Worker Direct Dial : [phone]  Fax: [phone]

## 2024-03-28 NOTE — Telephone Encounter (Signed)
 I called [name] to review the lab results from 03/25/2024. There is evidence of persistent anemia with Hgb 7.8 that is most likely secondary to underlying inflammation and iron  deficiency. Recommend IV iron  to bolster iron  levels. In addition, tumor markers were normal except for chromogranin levels. Reviewed chromogranin levels can be elevated for neuroendocrine tumors and non-malignant conditions such as liver disease, inflammation, PPI use. Plan to review CT liver next to determine if there is a target lesion amenable for biopsy.   [name] expressed understanding of the plan provided.

## 2024-03-29 ENCOUNTER — Telehealth: Payer: Self-pay

## 2024-03-29 ENCOUNTER — Encounter: Payer: Self-pay | Admitting: Medical Oncology

## 2024-03-29 ENCOUNTER — Encounter: Payer: Self-pay | Admitting: Physician Assistant

## 2024-03-29 ENCOUNTER — Encounter (HOSPITAL_COMMUNITY): Payer: Self-pay | Admitting: Physician Assistant

## 2024-03-29 NOTE — Progress Notes (Signed)
 Complex Care Management Note Care Guide Note  03/29/2024 Name: Yue Flanigan MRN: [id number] DOB: [birth year]  Kylena Toppin Lockyer is a 43 y.o. year old female who is a primary care patient of Georgina Speaks, FNP . The community resource team was consulted for assistance with Financial Difficulties related to utilities.  SDOH screenings and interventions completed:  No        Care guide performed the following interventions: Patient provided with information about care guide support team and interviewed to confirm resource needs.  Follow Up Plan:  Client will call me back later when she has time to talk. She was on another call.  Encounter Outcome:  Patient Visit Completed  Varun Jourdan [facility]  [facility] Resource Care Guide Direct Dial : [phone]  Fax: [phone] Website: [email or web address]

## 2024-03-30 ENCOUNTER — Encounter: Payer: Self-pay | Admitting: Internal Medicine

## 2024-03-30 ENCOUNTER — Other Ambulatory Visit: Payer: Self-pay

## 2024-03-30 ENCOUNTER — Other Ambulatory Visit (HOSPITAL_COMMUNITY): Payer: Self-pay

## 2024-03-30 ENCOUNTER — Encounter: Payer: Self-pay | Admitting: Nurse Practitioner

## 2024-03-30 ENCOUNTER — Other Ambulatory Visit: Payer: Self-pay | Admitting: Nurse Practitioner

## 2024-03-30 ENCOUNTER — Telehealth (INDEPENDENT_AMBULATORY_CARE_PROVIDER_SITE_OTHER): Payer: Self-pay | Admitting: Nurse Practitioner

## 2024-03-30 ENCOUNTER — Telehealth: Payer: Self-pay

## 2024-03-30 ENCOUNTER — Encounter: Payer: Self-pay | Admitting: Medical Oncology

## 2024-03-30 ENCOUNTER — Ambulatory Visit (HOSPITAL_COMMUNITY)
Admission: RE | Admit: 2024-03-30 | Discharge: 2024-03-30 | Disposition: A | Discharge status: Home / Self Care | Source: Ambulatory Visit | Attending: Physician Assistant | Admitting: Physician Assistant

## 2024-03-30 DIAGNOSIS — K769 Liver disease, unspecified: Secondary | ICD-10-CM | POA: Diagnosis not present

## 2024-03-30 DIAGNOSIS — F418 Other specified anxiety disorders: Secondary | ICD-10-CM

## 2024-03-30 DIAGNOSIS — E782 Mixed hyperlipidemia: Secondary | ICD-10-CM

## 2024-03-30 DIAGNOSIS — E1169 Type 2 diabetes mellitus with other specified complication: Secondary | ICD-10-CM

## 2024-03-30 DIAGNOSIS — M726 Necrotizing fasciitis: Secondary | ICD-10-CM

## 2024-03-30 DIAGNOSIS — Z09 Encounter for follow-up examination after completed treatment for conditions other than malignant neoplasm: Secondary | ICD-10-CM

## 2024-03-30 MED ORDER — IOHEXOL 300 MG/ML  SOLN
100.0000 mL | Freq: Once | INTRAMUSCULAR | Status: AC | PRN
  Administered 2024-03-30: 100 mL via INTRAVENOUS

## 2024-03-30 MED ORDER — OXYCODONE-ACETAMINOPHEN 7.5-325 MG PO TABS: 2.0000 | ORAL_TABLET | ORAL | 0 refills | Status: DC | PRN

## 2024-03-30 MED ORDER — METHOCARBAMOL 500 MG PO TABS: 500.0000 mg | ORAL_TABLET | Freq: Three times a day (TID) | ORAL | 0 refills | Status: DC | PRN

## 2024-03-30 NOTE — Progress Notes (Signed)
 Complex Care Management Note Care Guide Note  03/30/2024 Name: [name] MRN: [id number] DOB: Nov 21, 1980  [name] is a 43 y.o. year old female who is a primary care patient of Georgina Speaks, FNP . The community resource team was consulted for assistance with Food Insecurity and Financial Difficulties related to utilities.  SDOH screenings and interventions completed:  Yes  Social Drivers of Health From This Encounter   Food Insecurity: Food Insecurity Present (03/30/2024)   Hunger Vital Sign    Worried About Running Out of Food in the Last Year: Sometimes true    Ran Out of Food in the Last Year: Never true  Housing: Low Risk  (03/30/2024)   Housing Stability Vital Sign    Unable to Pay for Housing in the Last Year: No    Number of Times Moved in the Last Year: 0    Homeless in the Last Year: No  Financial Resource Strain: High Risk (03/30/2024)   Overall Financial Resource Strain (CARDIA)    Difficulty of Paying Living Expenses: Hard  Transportation Needs: No Transportation Needs (03/30/2024)   PRAPARE - Administrator, Civil Service (Medical): No    Lack of Transportation (Non-Medical): No  Utilities: At Risk (03/30/2024)   Utilities    Threatened with loss of utilities: Yes    SDOH Interventions Today    Flowsheet Row Most Recent Value  SDOH Interventions   Food Insecurity Interventions Other (Comment)  [Patient stated her family is assisting her with food.]  Utilities Interventions Other (Comment)  [Patient stated her sister gave her the money to have her water  turned back on.]  Financial Strain Interventions Other (Comment)  [Emailed information about the [employer].  Patient stated she has filed for short term disability and should be receiving a payment soon.]     Care guide performed the following interventions: Patient provided with information about care guide support team and interviewed to confirm resource needs.  Follow Up Plan:  No  further follow up planned at this time. The patient has been provided with needed resources.  Encounter Outcome:  Patient Visit Completed  Kent Braunschweig [facility]  [facility] Resource Care Guide Direct Dial : [phone]  Fax: [phone] Website: [email or web address]

## 2024-03-30 NOTE — Progress Notes (Signed)
 Virtual Visit via Video Note  [name], CMA,acting as a scribe for Stephanie Ada, FNP.,have documented all relevant documentation on the behalf of Stephanie Ada, FNP,as directed by  Stephanie Ada, FNP while in the presence of Stephanie Ada, FNP.  I connected with [name] on 03/30/2024 at 12:20 PM EDT by a video enabled telemedicine application and verified that I am speaking with the correct person using two identifiers.  Patient Location: Home Provider Location: Office/Clinic  I discussed the limitations, risks, security, and privacy concerns of performing an evaluation and management service by video and the availability of in person appointments. I also discussed with the patient that there may be a patient responsible charge related to this service. The patient expressed understanding and agreed to proceed.  Subjective: PCP: Bryant Gaines, FNP  Chief Complaint  Patient presents with   Hospitalization Follow-up    Patient presents today for a hospital follow up, patient reports compliance with medications. Patient denies any chest pain, SOB, or headaches. Patient was admitted 03/13/2024 and discharged on 03/23/2024. Patient was diagnosed with Necrotizing Fasciitis. Patient reports today she is still having a lot of pain but she is running out of her  oxyCODONE -acetaminophen  she would like a refill. Patient reports she is currently taking her muscle relaxer. Patient reports [facility] hasn't sent her supplies.   Seen a spot on her liver that is targeting out. She had lost 2 pints of blood earlier that day on the 4th surgery. Her gown was wet and called for the surgeon and did emergency surgery. She feels like mentally she is okay. She is to f/u with the wound specialist. She is using [facility] - no supplies in the office - abd pads, kerlix, normal saline. Her godsister came in and did the dressing change in front of the surgeon. Changing dressing 2 times a day.   She is not having  luck with the [facility] nurse. She is to go to the Wound clinic on the Sept 5th.   She is taking pain medication and muscle relaxer. She has been alternating. They advised to not take any tylenol  or motrin  due to blood loss and spot on her liver.   Blood sugar was 112 - 190, highest 234. She is on 40 units basaglar  in am and sliding scale. Her last time she took Farxiga  was several months ago.      ROS: Per HPI  Current Outpatient Medications:    albuterol  (VENTOLIN  HFA) 108 (90 Base) MCG/ACT inhaler, Inhale 1-2 puffs into the lungs every 6 (six) hours as needed for wheezing or shortness of breath., Disp: 18 g, Rfl: 0   Blood Glucose Monitoring Suppl (BLOOD GLUCOSE MONITOR SYSTEM) w/Device KIT, Use 3 (three) times daily as directed, Disp: 1 kit, Rfl: 0   fluticasone  (FLONASE ) 50 MCG/ACT nasal spray, Place 1-2 sprays into both nostrils daily., Disp: 16 g, Rfl: 0   glucose blood (ACCU-CHEK GUIDE) test strip, 1 each by Other route daily in the afternoon. Use as instructed, Disp: 100 each, Rfl: 3   Glucose Blood (BLOOD GLUCOSE TEST STRIPS) STRP, Use 3 (three) times daily as directed to check blood sugar., Disp: 100 strip, Rfl: 0   Lancet Device MISC, Use 3 (three) times daily. May dispense any manufacturer covered by patient's insurance., Disp: 1 each, Rfl: 0   Lancets (ACCU-CHEK MULTICLIX) lancets, USE TO CHECK BLOOD SUGARS UP TO 4 TIMES DAILY  DX CODE:E11.9, Disp: 100 each, Rfl: 12   levonorgestrel  (MIRENA ) 20 MCG/24HR  IUD, 1 each by Intrauterine route once., Disp: , Rfl:    omeprazole (PRILOSEC) 20 MG capsule, Take 20 mg by mouth daily., Disp: , Rfl:    triamcinolone  (KENALOG ) 0.025 % ointment, Apply 1 application topically 2 (two) times daily., Disp: 30 g, Rfl: 0   ACCU-CHEK GUIDE TEST test strip, USED TO TEST BLOOD SUGARS 3-4 TIMES DAILY *NEW PRESCRIPTION REQUEST*, Disp: 400 strip, Rfl: 11   Accu-Chek Softclix Lancets lancets, USED TO TEST BLOOD SUGARS 3-4 TIMES DAILY *NEW PRESCRIPTION REQUEST*,  Disp: 400 each, Rfl: 11   atorvastatin  (LIPITOR) 20 MG tablet, TAKE 1 TABLET BY MOUTH EVERY DAY *NEW PRESCRIPTION REQUEST*, Disp: 90 tablet, Rfl: 2   cetirizine  (ALLERGY RELIEF CETIRIZINE ) 10 MG tablet, TAKE 1 TABLET BY MOUTH EVERY DAY *NEW PRESCRIPTION REQUEST*, Disp: 90 tablet, Rfl: 2   Continuous Glucose Sensor (DEXCOM G7 SENSOR) MISC, 1 Device by Does not apply route as directed. Every 10 days, Disp: 9 each, Rfl: 3   ferrous sulfate  (FEROSUL) 325 (65 FE) MG tablet, TAKE ONE (1) TABLET BY MOUTH TWICE DAILY *NEW PRESCRIPTION REQUEST*, Disp: 180 tablet, Rfl: 1   insulin  aspart (NOVOLOG  FLEXPEN) 100 UNIT/ML FlexPen, Inject 4 Units into the skin 3 (three) times daily with meals. If eating and Blood Glucose (BG) 80 or higher inject 0 units for meal coverage and add correction dose per scale. If not eating, correction dose only. BG <150= 0 unit; BG 150-200= 1 unit; BG 201-250= 2 unit; BG 251-300= 3 unit; BG 301-350= 4 unit; BG 351-400= 5 unit, Disp: 30 mL, Rfl: 3   Insulin  Glargine (BASAGLAR  KWIKPEN) 100 UNIT/ML, Inject 36 Units into the skin daily., Disp: 45 mL, Rfl: 3   Insulin  Pen Needle 31G X 8 MM MISC, 1 Device by Does not apply route in the morning, at noon, in the evening, and at bedtime., Disp: 400 each, Rfl: 3   metFORMIN  (GLUCOPHAGE -XR) 750 MG 24 hr tablet, TAKE 2 TABLETS (1,500 MG TOTAL) BY MOUTH EVERY DAY WITH BREAKFAST *NEW PRESCRIPTION REQUEST*, Disp: 180 tablet, Rfl: 2   methocarbamol  (ROBAXIN ) 500 MG tablet, TAKE 1 TABLET BY MOUTH THREE TIMES DAILY *NEW PRESCRIPTION REQUEST*, Disp: 270 tablet, Rfl: 0   ondansetron  (ZOFRAN ) 8 MG tablet, TAKE 1 TABLET BY MOUTH EVERY 8 HOURS AS NEEDED FOR NAUSEA OR VOMITING. *NEW PRESCRIPTION REQUEST*, Disp: 90 tablet, Rfl: 0   oxyCODONE -acetaminophen  (PERCOCET) 7.5-325 MG tablet, Take 2 tablets by mouth every 4 (four) hours as needed for moderate pain (pain score 4-6)., Disp: 30 tablet, Rfl: 0   polyethylene glycol (MIRALAX / GLYCOLAX) 17 g packet, Take 17 g  by mouth daily., Disp: , Rfl:    sertraline  (ZOLOFT ) 50 MG tablet, TAKE 1 & 1/2 TABLETS BY MOUTH ONCE DAILY *NEW PRESCRIPTION REQUEST*, Disp: 135 tablet, Rfl: 0  Observations/Objective: Today's Vitals   03/30/24 1156  PainSc: 10-Worst pain ever  PainLoc: Vagina   Physical Exam Vitals and nursing note reviewed.  Constitutional:      General: She is not in acute distress.    Appearance: Normal appearance. She is obese.     Comments: Laying in the bed during visit  Pulmonary:     Effort: Pulmonary effort is normal. No respiratory distress.  Skin:    Capillary Refill: Capillary refill takes less than 2 seconds.  Neurological:     General: No focal deficit present.     Mental Status: She is alert and oriented to person, place, and time.     Cranial Nerves: No cranial nerve  deficit.  Psychiatric:        Mood and Affect: Mood normal.        Behavior: Behavior normal.        Thought Content: Thought content normal.        Judgment: Judgment normal.     Assessment and Plan: Necrotizing fasciitis (HCC) Assessment & Plan: TCM Performed. A member of the clinical team spoke with the patient upon dischare. Discharge summary was reviewed in full detail during the visit. Meds reconciled and compared to discharge meds. Medication list is updated and reviewed with the patient.  Greater than 50% face to face time was spent in counseling an coordination of care.  All questions were answered to the satisfaction of the patient.   Overall she is doing better but she is having to have her dressing changed 2 times a day, her sister is helping her with that when the nurse is unable. She is to follow up with the wound center as well. Will refill her pain medications for a limited amount.   Orders: -     oxyCODONE -Acetaminophen ; Take 2 tablets by mouth every 4 (four) hours as needed for moderate pain (pain score 4-6).  Dispense: 30 tablet; Refill: 0    Follow Up Instructions: Return for keep same  next..   I discussed the assessment and treatment plan with the patient. The patient was provided an opportunity to ask questions, and all were answered. The patient agreed with the plan and demonstrated an understanding of the instructions.   The patient was advised to call back or seek an in-person evaluation if the symptoms worsen or if the condition fails to improve as anticipated.  The above assessment and management plan was discussed with the patient. The patient verbalized understanding of and has agreed to the management plan.   Stephanie Stephanie Ada, FNP, have reviewed all documentation for this visit. The documentation on 03/30/24 for the exam, diagnosis, procedures, and orders are all accurate and complete.

## 2024-03-30 NOTE — Progress Notes (Signed)
 [facility]  Patient called asking if we had ABD pads here at the clinic that we can provide to her. I checked our clinic and we do not have any that keep in stock here at the clinic. Call to patient to inform her of this. Patient expressed thanks.   Colene KYM Raider, RN, BSN, [facility] Oncology Nurse Navigator, [facility] 03/30/2024 4:09 PM

## 2024-03-31 ENCOUNTER — Other Ambulatory Visit: Payer: Self-pay | Admitting: Physician Assistant

## 2024-03-31 ENCOUNTER — Telehealth: Payer: Self-pay | Admitting: Physician Assistant

## 2024-03-31 ENCOUNTER — Encounter: Payer: Self-pay | Admitting: Physician Assistant

## 2024-03-31 ENCOUNTER — Other Ambulatory Visit: Payer: Self-pay | Admitting: *Deleted

## 2024-03-31 ENCOUNTER — Telehealth: Payer: Self-pay | Admitting: Hematology and Oncology

## 2024-03-31 ENCOUNTER — Other Ambulatory Visit (HOSPITAL_COMMUNITY): Payer: Self-pay

## 2024-03-31 ENCOUNTER — Other Ambulatory Visit: Payer: Self-pay

## 2024-03-31 DIAGNOSIS — K769 Liver disease, unspecified: Secondary | ICD-10-CM

## 2024-03-31 DIAGNOSIS — K7469 Other cirrhosis of liver: Secondary | ICD-10-CM

## 2024-03-31 DIAGNOSIS — D508 Other iron deficiency anemias: Secondary | ICD-10-CM

## 2024-03-31 NOTE — Patient Instructions (Signed)
 Visit Information  Thank you for taking time to visit with me today. Please don't hesitate to contact me if I can be of assistance to you before our next scheduled telephone appointment.   Following is a copy of your care plan:   Goals Addressed             This Visit's Progress    VBCI Transitions of Care (TOC) Care Plan       Problems:  Recent Hospitalization for treatment of Necrotizing Faciitis SDOH barrier: financial difficulties  Goal:  Over the next 30 days, the patient will not experience hospital readmission  Interventions:  Transitions of Care: Doctor Visits  - discussed the importance of doctor visits Post discharge activity limitations prescribed by provider reviewed Post-op wound/incision care reviewed with patient/caregiver Reviewed Signs and symptoms of infection VBCI SW referral placed for assistance with financial difficulties Appointments reviewed including:Endocrinology on 04/01/24 and Post op on 04/08/24 Ensured patient working with BSW for resources  Patient Self Care Activities:  Attend all scheduled provider appointments Call pharmacy for medication refills 3-7 days in advance of running out of medications Call provider office for new concerns or questions  Notify RN Care Manager of TOC call rescheduling needs Participate in Transition of Care Program/Attend TOC scheduled calls Take medications as prescribed    Plan:  Telephone follow up appointment with care management team member scheduled for:  04/07/24 at 3pm        Patient verbalizes understanding of instructions and care plan provided today and agrees to view in MyChart. Active MyChart status and patient understanding of how to access instructions and care plan via MyChart confirmed with patient.     Telephone follow up appointment with care management team member scheduled for:  Please call the care guide team at [phone] if you need to cancel or reschedule your appointment.   Please  call 1-800-273-TALK (toll free, 24 hour hotline) go to [facility] Urgent [facility] [address], [place] [phone]) call 911 if you are experiencing a Mental Health or Behavioral Health Crisis or need someone to talk to.  Andrea Dimes RN, BSN [place]  [facility] Health RN Care Manager [phone]

## 2024-03-31 NOTE — Telephone Encounter (Signed)
 I called and spoke to [name] to review the CT liver results from yesterday. There is no evidence of suspicious liver lesions seen on exam. Since MRI is the preferred imaging modality, we will repeat MRI in 3 months.   We will see patient after she receives IV iron  in 4-6 weeks.   Referral to GI has been made regarding cirrhosis diagnosis with underlying iron  deficiency anemia.

## 2024-03-31 NOTE — Transitions of Care (Post Inpatient/ED Visit) (Signed)
 Transition of Care week 2  Visit Note  03/31/2024  Name: [name] MRN: [id number]          DOB: [birth year]  Situation: Patient enrolled in [facility] 30-day program. Visit completed with [name] by telephone.   Background:   Initial Transition Care Management Follow-up Telephone Call    Past Medical History:  Diagnosis Date   Abnormal Pap smear    f/u wnl   Asthma    DM2 (diabetes mellitus, type 2) (HCC)    DM2 (diabetes mellitus, type 2) (HCC) 09/13/2012   AIC 9.7 09-24-12 Referred to Dr. Jarold after consult with Dr. Darcel Appt. Scheduled  C. Armer, CNM, FNP    Fibroid    Gestational diabetes    Hypertension    Morbid obesity (HCC)    Sleep apnea    not on cpap, diagnosed when pregnant   Urinary tract infection     Assessment: Patient Reported Symptoms: Cognitive Cognitive Status: Alert and oriented to person, place, and time, Able to follow simple commands, Normal speech and language skills      Neurological Neurological Review of Symptoms: No symptoms reported    HEENT HEENT Symptoms Reported: No symptoms reported      Cardiovascular Cardiovascular Symptoms Reported: No symptoms reported    Respiratory Respiratory Symptoms Reported: No symptoms reported    Endocrine Endocrine Symptoms Reported: No symptoms reported Is patient diabetic?: Yes Is patient checking blood sugars at home?: Yes List most recent blood sugar readings, include date and time of day: BS this morning was 77. Currently BS 177, patient last ate around 45 minutes. Endocrine Self-Management Outcome: 3 (uncertain) Endocrine Comment: Scheduled with Endocrinology 04/01/24.  Gastrointestinal Gastrointestinal Symptoms Reported: Nausea, Constipation Additional Gastrointestinal Details: Nausea this morning, denies at this time. Eating a carb modified diet. LBM 03/31/24 Gastrointestinal Management Strategies: Diet modification, Medication therapy Gastrointestinal Self-Management Outcome: 4  (good)    Genitourinary Genitourinary Symptoms Reported: No symptoms reported    Integumentary Integumentary Symptoms Reported: Wound Skin Self-Management Outcome: 4 (good) Skin Comment: Dressing changes by patient's sister and [facility] RN. Patient reports good healing. [facility].  Musculoskeletal Musculoskelatal Symptoms Reviewed: Weakness Musculoskeletal Self-Management Outcome: 4 (good)      Psychosocial Psychosocial Symptoms Reported: Not assessed         There were no vitals filed for this visit.  Medications Reviewed Today     Reviewed by [name] LABOR, RN (Registered Nurse) on 03/31/24 at 1553  Med List Status: <None>   Medication Order Taking? Sig Documenting Provider Last Dose Status Informant  Accu-Chek Softclix Lancets lancets [id number] Yes Use 3 (three) times daily. Use as directed to check blood sugar. May dispense any manufacturer covered by patient's insurance and fits patient's device. Samtani, Jai-Gurmukh, MD  Active   albuterol  (VENTOLIN  HFA) 108 (90 Base) MCG/ACT inhaler [id number] Yes Inhale 1-2 puffs into the lungs every 6 (six) hours as needed for wheezing or shortness of breath. Wieters, Hallie C, [name]  Active Self, Pharmacy Records  atorvastatin  (LIPITOR) 20 MG tablet [id number] Yes Take 1 tablet (20 mg total) by mouth daily. Georgina Speaks, FNP  Active Self, Pharmacy Records           Med Note (CRUTHIS, CHLOE C   Mon Mar 14, 2024  8:20 AM) Has not taken in a week per pt. Reason not provided.   Blood Glucose Monitoring Suppl (BLOOD GLUCOSE MONITOR SYSTEM) w/Device KIT [id number] Yes Use 3 (three) times daily as directed  Samtani, Jai-Gurmukh, MD  Active   cetirizine  (ZYRTEC  ALLERGY) 10 MG tablet [id number] Yes Take 1 tablet (10 mg total) by mouth daily. Georgina Speaks, FNP  Active Self, Pharmacy Records  ferrous sulfate  325 [phone] FE) MG tablet [id number] Yes Take 1 tablet (325 mg total) by mouth 2 (two) times daily with a meal. Samtani,  Jai-Gurmukh, MD  Active   fluticasone  (FLONASE ) 50 MCG/ACT nasal spray [id number] Yes Place 1-2 sprays into both nostrils daily. Ghumman, Ramandeep, NP  Active Self, Pharmacy Records  glucose blood (ACCU-CHEK GUIDE) test strip [id number] Yes 1 each by Other route daily in the afternoon. Use as instructed Shamleffer, Ibtehal Jaralla, MD  Active Self, Pharmacy Records  Glucose Blood (BLOOD GLUCOSE TEST STRIPS) STRP [id number] Yes Use 3 (three) times daily as directed to check blood sugar. Samtani, Jai-Gurmukh, MD  Active   insulin  aspart (NOVOLOG  FLEXPEN) 100 UNIT/ML FlexPen [id number] Yes Inject 4 Units into the skin 3 (three) times daily with meals. If eating and Blood Glucose (BG) 80 or higher inject 0 units for meal coverage and add correction dose per scale. If not eating, correction dose only. BG <150= 0 unit; BG 150-200= 1 unit; BG 201-250= 2 unit; BG 251-300= 3 unit; BG 301-350= 4 unit; BG 351-400= 5 unit; BG >400= 6 unit and Call Primary Care. Samtani, Jai-Gurmukh, MD  Active   Insulin  Glargine (BASAGLAR  [facility]) 100 UNIT/ML [id number] Yes Inject 40 Units into the skin daily. Shamleffer, Donell Cardinal, MD  Active Self, Pharmacy Records  Insulin  Pen Needle 31G X 8 MM MISC [id number] Yes 1 Device by Does not apply route daily. Shamleffer, Donell Cardinal, MD  Active Self, Pharmacy Records  Insulin  Pen Needle 32G X 4 MM MISC [id number] Yes Use as directed 3 (three) times daily. Samtani, Jai-Gurmukh, MD  Active   Lancet Device MISC [id number] Yes Use 3 (three) times daily. May dispense any manufacturer covered by patient's insurance. Samtani, Jai-Gurmukh, MD  Active   Lancets (ACCU-CHEK MULTICLIX) lancets [id number] Yes USE TO CHECK BLOOD SUGARS UP TO 4 TIMES DAILY  DX CODE:E11.9 Shamleffer, Donell Cardinal, MD  Active Self, Pharmacy Records  levonorgestrel  (MIRENA ) 20 MCG/24HR IUD [id number] Yes 1 each by Intrauterine route once. [provider]  Active Self, Pharmacy Records           Med Note  (CRUTHIS, CHLOE C   Mon Mar 14, 2024  8:21 AM) Still has this inserted.   metFORMIN  (GLUCOPHAGE -XR) 750 MG 24 hr tablet [id number] Yes Take 2 tablets (1,500 mg total) by mouth daily with breakfast. Shamleffer, Donell Cardinal, MD  Active Self, Pharmacy Records  methocarbamol  (ROBAXIN ) 500 MG tablet [id number] Yes Take 1 tablet (500 mg total) by mouth every 8 (eight) hours as needed for muscle spasms. Georgina Speaks, FNP  Active   omeprazole (PRILOSEC) 20 MG capsule [id number] Yes Take 20 mg by mouth daily. [provider]  Active Self, Pharmacy Records  ondansetron  (ZOFRAN ) 8 MG tablet [id number] Yes Take 1 tablet (8 mg total) by mouth every 8 (eight) hours as needed for nausea or vomiting. Thayil, Irene T, [name]  Active   oxyCODONE -acetaminophen  (PERCOCET) 7.5-325 MG tablet [id number] Yes Take 2 tablets by mouth every 4 (four) hours as needed for moderate pain (pain score 4-6). Georgina Speaks, FNP  Active   sertraline  (ZOLOFT ) 50 MG tablet [id number] Yes Take 1 1/2 tablet by mouth daily Georgina Speaks, FNP  Active Self, Pharmacy Records  triamcinolone  (KENALOG ) 0.025 % ointment [id number] Yes Apply 1 application topically 2 (two) times  daily.  Patient taking differently: Apply 1 application  topically daily as needed (rash).   Ghumman, Ramandeep, NP  Active Self, Pharmacy Records            Recommendation:   Continue Current Plan of Care  Follow Up Plan:   Telephone follow-up in 1 week  Andrea Dimes RN, BSN [place]  [facility] Health RN Care Manager [phone]

## 2024-03-31 NOTE — Progress Notes (Deleted)
 Name: [name]  MRN/ DOB: [id number], 1981-02-27   Age/ Sex: 43 y.o., female    PCP: Georgina Speaks, FNP   Reason for Endocrinology Evaluation: Type 2 Diabetes Mellitus     Date of Initial Endocrinology Visit: 11/30/2020    PATIENT IDENTIFIER: [name] is a 42 y.o. female with a past medical history of T2DM, OSA, Dyslipidemia and HTN. The patient presented for initial endocrinology clinic visit on 11/30/2020 for consultative assistance with her diabetes management.    HPI: [name] was    Diagnosed with gestational diabetes in 2012  And never resolved after delivery Hemoglobin A1c has ranged from 7.1% in 2021, peaking at 11.1 in 2022.  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  She was lost to follow-up for approximately 18 months before her return to our clinic again.   Switched Ozempic  to Mounjaro  08/2023  I have attempted to screen her for [place] but saliva cortisol was not submitted January, 2025    SUBJECTIVE:   During the last visit (08/28/2023): A1c 9.3%  Today (03/31/24): [name] is here for follow-up on diabetes management.  She checks her blood sugars 0 times daily.  Patient presented to the ED 03/13/2024 with bilateral labial abscess, patient was diagnosed with necrotizing fasciitis and hypovolemic shock secondary to blood loss.  Patient required wound care as well as blood transfusion   HOME DIABETES REGIMEN: Tresiba  40 units daily  Mounjaro  5 mg weekly Metformin  750 mg XR 2 tabs daily  Farxiga  10 mg daily     Statin: yes ACE-I/ARB: yes Prior Diabetic Education: yes   METER DOWNLOAD SUMMARY: Did not bring    DIABETIC COMPLICATIONS: Microvascular complications:  Denies: CKD,  Neuropathy, retinopathy  Last eye exam: Completed 2021  Macrovascular complications:   Denies: CAD,  PVD, CVA   PAST HISTORY: Past Medical History:  Past Medical History:  Diagnosis Date   Abnormal Pap smear    f/u wnl   Asthma    DM2 (diabetes mellitus, type 2) (HCC)    DM2 (diabetes mellitus, type 2) (HCC) 09/13/2012   AIC 9.7 09-24-12 Referred to Dr. Jarold after consult with Dr. Darcel Appt. Scheduled  C. Armer, CNM, FNP    Fibroid    Gestational diabetes    Hypertension    Morbid obesity (HCC)    Sleep apnea    not on cpap, diagnosed when pregnant   Urinary tract infection    Past Surgical History:  Past Surgical History:  Procedure Laterality Date   BREAST BIOPSY Right 07/24/2022   US  RT BREAST BX W LOC DEV 1ST LESION IMG BX SPEC US  GUIDE 07/24/2022 [facility] MAMMOGRAPHY   CESAREAN SECTION     DILATION AND CURETTAGE OF UTERUS     INCISION AND DRAINAGE OF WOUND N/A 03/13/2024   Procedure: IRRIGATION AND DEBRIDEMENT WOUND;  Surgeon: Polly Cordella LABOR, MD;  Location: [facility] OR;  Service: General;  Laterality: N/A;   INCISION AND DRAINAGE PERIRECTAL ABSCESS N/A 03/16/2024   Procedure: INCISION AND DRAINAGE, ABSCESS, PERIRECTAL;  Surgeon: Lyndel Deward PARAS, MD;  Location: [facility] OR;  Service: General;  Laterality: N/A;   IRRIGATION AND DEBRIDEMENT OF NECROTIZING SOFT TISSUE INFECTION Left 03/18/2024   Procedure: EXAM UNDER ANESTHESIA; INCISION, DRAINAGE, AND DEBRIDEMENT OF NECROTIZING SOFT TISSUE INFECTION LEFT LABIA AND THIGH;  Surgeon: Lyndel Deward PARAS,  MD;  Location: [facility] OR;  Service: General;  Laterality: Left;   RECTAL EXAM UNDER ANESTHESIA N/A 03/13/2024   Procedure: EXAM UNDER ANESTHESIA,;  Surgeon: Polly Cordella LABOR, MD;  Location: [facility] OR;  Service: General;  Laterality: N/A;  I AND D left labia necrotizing infection   TUBAL LIGATION     WOUND EXPLORATION Left 03/19/2024   Procedure: EXPLORATION OF LEFT GROIN WOUND;  Surgeon: Dasie Leonor CROME, MD;  Location: [facility] OR;  Service: General;  Laterality: Left;  LEFT GROIN WOUND EXPLORATION    Social History:  reports that she  quit smoking about 4 years ago. Her smoking use included cigarettes. She has quit using smokeless tobacco. She reports that she does not drink alcohol and does not use drugs. Family History:  Family History  Problem Relation Age of Onset   Allergies Mother    Asthma Mother    Hypertension Mother    Diabetes Mother    Diabetes Father    Diabetes Maternal Grandmother    Heart disease Maternal Grandmother    Liver cancer Other    Liver cancer Other    Bladder Cancer Maternal Uncle    Anesthesia problems Neg Hx    Hypotension Neg Hx    Malignant hyperthermia Neg Hx    Pseudochol deficiency Neg Hx    Hearing loss Neg Hx      HOME MEDICATIONS: Allergies as of 04/01/2024       Reactions   Farxiga  [dapagliflozin ] Other (See Comments)   necrotizing fascititis   Fish Allergy Anaphylaxis   Other Anaphylaxis, Other (See Comments)   Pt has fish allergy Pt has allergy to butter cookies   Penicillins Rash, Other (See Comments)   Told MD that PCN reaction is rash and that she has taken amoxicillin  before. Tolerated Cefepime  X1 during 8/25 admission.    Bactrim  [sulfamethoxazole -trimethoprim ] Diarrhea, Rash   Abdominal cramps        Medication List        Accurate as of March 31, 2024  3:43 PM. If you have any questions, ask your nurse or doctor.          Accu-Chek Guide test strip Generic drug: glucose blood 1 each by Other route daily in the afternoon. Use as instructed   Accu-Chek Guide Test test strip Generic drug: glucose blood Use 3 (three) times daily as directed to check blood sugar.   Accu-Chek Guide w/Device Kit Use 3 (three) times daily as directed   accu-chek multiclix lancets USE TO CHECK BLOOD SUGARS UP TO 4 TIMES DAILY  DX CODE:E11.9   Accu-Chek Softclix Lancets lancets Use 3 (three) times daily. Use as directed to check blood sugar. May dispense any manufacturer covered by patient's insurance and fits patient's device.   albuterol  108 (90 Base)  MCG/ACT inhaler Commonly known as: VENTOLIN  HFA Inhale 1-2 puffs into the lungs every 6 (six) hours as needed for wheezing or shortness of breath.   atorvastatin  20 MG tablet Commonly known as: LIPITOR Take 1 tablet (20 mg total) by mouth daily.   Basaglar  KwikPen 100 UNIT/ML Inject 40 Units into the skin daily.   cetirizine  10 MG tablet Commonly known as: ZyrTEC  Allergy Take 1 tablet (10 mg total) by mouth daily.   ferrous sulfate  325 (65 FE) MG tablet Take 1 tablet (325 mg total) by mouth 2 (two) times daily with a meal.   fluticasone  50 MCG/ACT nasal spray Commonly known as: FLONASE  Place 1-2 sprays into both nostrils daily.   Insulin   Pen Needle 31G X 8 MM Misc 1 Device by Does not apply route daily.   TechLite Pen Needles 32G X 4 MM Misc Generic drug: Insulin  Pen Needle Use as directed 3 (three) times daily.   Lancet Device Misc Use 3 (three) times daily. May dispense any manufacturer covered by patient's insurance.   levonorgestrel  20 MCG/24HR IUD Commonly known as: MIRENA  1 each by Intrauterine route once.   metFORMIN  750 MG 24 hr tablet Commonly known as: GLUCOPHAGE -XR Take 2 tablets (1,500 mg total) by mouth daily with breakfast.   methocarbamol  500 MG tablet Commonly known as: ROBAXIN  Take 1 tablet (500 mg total) by mouth every 8 (eight) hours as needed for muscle spasms.   NovoLOG  FlexPen 100 UNIT/ML FlexPen Generic drug: insulin  aspart Inject 4 Units into the skin 3 (three) times daily with meals. If eating and Blood Glucose (BG) 80 or higher inject 0 units for meal coverage and add correction dose per scale. If not eating, correction dose only. BG <150= 0 unit; BG 150-200= 1 unit; BG 201-250= 2 unit; BG 251-300= 3 unit; BG 301-350= 4 unit; BG 351-400= 5 unit; BG >400= 6 unit and Call Primary Care.   omeprazole 20 MG capsule Commonly known as: PRILOSEC Take 20 mg by mouth daily.   ondansetron  8 MG tablet Commonly known as: ZOFRAN  Take 1 tablet (8 mg  total) by mouth every 8 (eight) hours as needed for nausea or vomiting.   oxyCODONE -acetaminophen  7.5-325 MG tablet Commonly known as: PERCOCET Take 2 tablets by mouth every 4 (four) hours as needed for moderate pain (pain score 4-6).   sertraline  50 MG tablet Commonly known as: ZOLOFT  Take 1 1/2 tablet by mouth daily   triamcinolone  0.025 % ointment Commonly known as: KENALOG  Apply 1 application topically 2 (two) times daily. What changed:  when to take this reasons to take this         ALLERGIES: Allergies  Allergen Reactions   Farxiga  [Dapagliflozin ] Other (See Comments)    necrotizing fascititis   Fish Allergy Anaphylaxis   Other Anaphylaxis and Other (See Comments)    Pt has fish allergy Pt has allergy to butter cookies   Penicillins Rash and Other (See Comments)    Told MD that PCN reaction is rash and that she has taken amoxicillin  before. Tolerated Cefepime  X1 during 8/25 admission.    Bactrim  [Sulfamethoxazole -Trimethoprim ] Diarrhea and Rash    Abdominal cramps        OBJECTIVE:   VITAL SIGNS: LMP 03/09/2024    PHYSICAL EXAM:  General: Pt appears well and is in NAD  Lungs: Clear with good BS B/L  Heart: RRR   Extremities:  Lower extremities - No pretibial edema  Neuro: MS is good with appropriate affect, pt is alert and Ox3    DM foot exam: 08/28/2023  The skin of the feet is intact without sores or ulcerations. The pedal pulses are 2+ on right and 2+ on left. The sensation is intact to a screening 5.07, 10 gram monofilament bilaterally      DATA REVIEWED:  Lab Results  Component Value Date   HGBA1C 8.8 (H) 03/10/2024   HGBA1C 9.3 (A) 08/28/2023   HGBA1C 10.9 (H) 05/04/2023    Latest Reference Range & Units 05/04/23 11:38  Sodium 134 - 144 mmol/L 135  Potassium 3.5 - 5.2 mmol/L 4.2  Chloride 96 - 106 mmol/L 97  CO2 20 - 29 mmol/L 23  Glucose 70 - 99 mg/dL 775 (H)  BUN 6 -  24 mg/dL 11  Creatinine 9.42 - 8.99 mg/dL 9.29  Calcium  8.7  - 10.2 mg/dL 9.7  BUN/Creatinine Ratio 9 - 23  16  eGFR >59 mL/min/1.73 111  Alkaline Phosphatase 44 - 121 IU/L 107  Albumin  3.9 - 4.9 g/dL 4.0  AST 0 - 40 IU/L 32  ALT 0 - 32 IU/L 39 (H)  Total Protein 6.0 - 8.5 g/dL 7.3  Total Bilirubin 0.0 - 1.2 mg/dL 0.5  Total CHOL/HDL Ratio 0.0 - 4.4 ratio 4.6 (H)  Cholesterol, Total 100 - 199 mg/dL 788 (H)  HDL Cholesterol >39 mg/dL 46  MICROALB/CREAT RATIO 0 - 29 mg/g creat 328 (H)  Triglycerides 0 - 149 mg/dL 829 (H)  VLDL Cholesterol Cal 5 - 40 mg/dL 30  LDL Chol Calc (NIH) 0 - 99 mg/dL 864 (H)  (H): Data is abnormally high   ASSESSMENT / PLAN / RECOMMENDATIONS:   1) Type 2 Diabetes Mellitus, Poorly  controlled, With microalbuminuria complications - Most recent A1c of 9.3 %. Goal A1c < 7.0 %.    -Patient with worsening glycemic control -Patient states she has been without Ozempic  for approximately a month due to lack of health insurance but she did have enough Tresiba , and Farxiga   -I have recommended Mounjaro , she now has insurance  MEDICATIONS: -Continue tresiba  to 40 units daily  -Continue Farxiga  10 mg, 1 tablet in the morning  -Continue Metformin  750 mg, 1 tablet twice daily  -Start Mounjaro  2.5 mg weekly for 4 weeks, then increase to 5 mg weekly   EDUCATION / INSTRUCTIONS: BG monitoring instructions: Patient is instructed to check her blood sugars 1 times a day, fasting. Call [facility] if: BG persistently < 70  I reviewed the Rule of 15 for the treatment of hypoglycemia in detail with the patient. Literature supplied.   2) Diabetic complications:  Eye: Does not have known diabetic retinopathy.  Neuro/ Feet: Does not have known diabetic peripheral neuropathy. Renal: Patient does not have known baseline CKD. She is on an ACEI/ARB at present.   3) Microalbuminuria  -Discussed the importance of optimizing glucose control and compliance with losartan  -I explained to the patient that microalbuminuria is  the first sign that there is renal diabetes damage -Will monitor    F/U in 3 months     Signed electronically by: Stefano Redgie Butts, MD  [facility]  [facility] [address] [place] [place], [place] [address] Phone: [phone] FAX: [phone]   CC: Georgina Speaks, FNP [address] [place] [address] Phone: [phone]  Fax: [phone]    Return to Endocrinology clinic as below: Future Appointments  Date Time Provider Department Center  04/01/2024 11:30 AM Meckenzie Balsley, Donell Redgie, MD [facility] None  04/13/2024  2:00 PM Maranda Lister D [facility] None  05/05/2024 10:00 AM Georgina Speaks, FNP [facility] [id number]  05/09/2024  4:00 PM [facility] None  06/20/2024  8:15 AM [facility] None  06/20/2024  8:40 AM Federico Norleen ONEIDA MADISON, MD [facility] None

## 2024-04-01 ENCOUNTER — Ambulatory Visit: Admitting: Internal Medicine

## 2024-04-01 ENCOUNTER — Telehealth: Payer: Self-pay

## 2024-04-01 ENCOUNTER — Encounter: Payer: Self-pay | Admitting: Physician Assistant

## 2024-04-01 NOTE — Telephone Encounter (Signed)
 [name], patient will be scheduled as soon as possible.  Auth Submission: NO AUTH NEEDED Site of care: Site of care: [facility] Payer: BCBS commercial Medication & CPT/J Code(s) submitted: Venofer (Iron  Sucrose) J1756 Diagnosis Code:  Route of submission (phone, fax, portal): phone Phone #  Fax # Auth type: Buy/Bill PB Units/visits requested: 200mg  x 5 doses Reference number: [id number] Approval from: 04/01/24 to 08/01/24

## 2024-04-06 ENCOUNTER — Ambulatory Visit (INDEPENDENT_AMBULATORY_CARE_PROVIDER_SITE_OTHER)

## 2024-04-06 ENCOUNTER — Other Ambulatory Visit (HOSPITAL_BASED_OUTPATIENT_CLINIC_OR_DEPARTMENT_OTHER): Payer: Self-pay

## 2024-04-06 VITALS — BP 113/77 | HR 96 | Temp 98.1°F | Resp 20 | Ht 65.0 in | Wt 300.6 lb

## 2024-04-06 DIAGNOSIS — D508 Other iron deficiency anemias: Secondary | ICD-10-CM

## 2024-04-06 MED ORDER — IRON SUCROSE 20 MG/ML IV SOLN
200.0000 mg | Freq: Once | INTRAVENOUS | Status: AC
  Administered 2024-04-06: 200 mg via INTRAVENOUS
  Filled 2024-04-06: qty 10

## 2024-04-06 NOTE — Progress Notes (Signed)
 Diagnosis: Iron  Deficiency Anemia  Provider:  Praveen Mannam MD  Procedure: IV Push  IV Type: Peripheral, IV Location: L Antecubital  Venofer  (Iron  Sucrose), Dose: 200 mg  Post Infusion IV Care: Observation period completed and Peripheral IV Discontinued  Discharge: Condition: Good, Destination: Home . AVS Declined  Performed by:  Leita FORBES Miles, LPN

## 2024-04-07 ENCOUNTER — Other Ambulatory Visit: Payer: Self-pay | Admitting: *Deleted

## 2024-04-07 NOTE — Patient Instructions (Signed)
 Visit Information  Thank you for taking time to visit with me today. Please don't hesitate to contact me if I can be of assistance to you before our next scheduled telephone appointment.   Following is a copy of your care plan:   Goals Addressed             This Visit's Progress    VBCI Transitions of Care (TOC) Care Plan       Problems:  Recent Hospitalization for treatment of Necrotizing Faciitis SDOH barrier: financial difficulties  Goal:  Over the next 30 days, the patient will not experience hospital readmission  Interventions:  Transitions of Care: Doctor Visits  - discussed the importance of doctor visits Post discharge activity limitations prescribed by provider reviewed Post-op wound/incision care reviewed with patient/caregiver Reviewed Signs and symptoms of infection Advised patient to discuss pain management during post op visit(heat and ice therapy) Appointments reviewed including:Post op on 04/08/24, Endocrinology on 04/11/24 and Infusion on 04/15/24 Ensured patient working with BSW for resources Discussed diet and the importance of protein in wound healing Collaborated with Care Guide for transportation resources  Patient Self Care Activities:  Attend all scheduled provider appointments Call pharmacy for medication refills 3-7 days in advance of running out of medications Call provider office for new concerns or questions  Notify RN Care Manager of TOC call rescheduling needs Participate in Transition of Care Program/Attend TOC scheduled calls Take medications as prescribed    Plan:  Telephone follow up appointment with care management team member scheduled for:  04/14/24 at 12:45pm        Patient verbalizes understanding of instructions and care plan provided today and agrees to view in MyChart. Active MyChart status and patient understanding of how to access instructions and care plan via MyChart confirmed with patient.     Telephone follow up appointment  with care management team member scheduled for:04/14/24 at 12:45pm  Please call the care guide team at [phone] if you need to cancel or reschedule your appointment.   Please call [phone]-TALK (toll free, 24 hour hotline) go to [facility] Urgent [facility] [address] [place] [phone]) call 911 if you are experiencing a Mental Health or Behavioral Health Crisis or need someone to talk to.  Andrea Dimes RN, BSN [place]  [facility] RN Care Manager [phone]

## 2024-04-07 NOTE — Transitions of Care (Post Inpatient/ED Visit) (Signed)
 Transition of Care week 3  Visit Note  04/07/2024  Name: [name] MRN: [id number]          DOB: [birth year]  Situation: Patient enrolled in [facility] 30-day program. Visit completed with [name] by telephone.   Background:   Initial Transition Care Management Follow-up Telephone Call    Past Medical History:  Diagnosis Date   Abnormal Pap smear    f/u wnl   Asthma    DM2 (diabetes mellitus, type 2) (HCC)    DM2 (diabetes mellitus, type 2) (HCC) 09/13/2012   AIC 9.7 09-24-12 Referred to Dr. Jarold after consult with Dr. Darcel Appt. Scheduled  C. Armer, CNM, FNP    Fibroid    Gestational diabetes    Hypertension    Morbid obesity (HCC)    Sleep apnea    not on cpap, diagnosed when pregnant   Urinary tract infection     Assessment: Patient Reported Symptoms: Cognitive Cognitive Status: Able to follow simple commands, Alert and oriented to person, place, and time, Normal speech and language skills      Neurological Neurological Review of Symptoms: No symptoms reported    HEENT HEENT Symptoms Reported: Not assessed      Cardiovascular Cardiovascular Symptoms Reported: Not assessed    Respiratory Respiratory Symptoms Reported: No symptoms reported    Endocrine Endocrine Symptoms Reported: No symptoms reported Is patient diabetic?: Yes Is patient checking blood sugars at home?: No List most recent blood sugar readings, include date and time of day: fasting today 117 Endocrine Self-Management Outcome: 3 (uncertain) Endocrine Comment: Endocrinolgy rescheduled to 04/11/24  Gastrointestinal Gastrointestinal Symptoms Reported: No symptoms reported Gastrointestinal Self-Management Outcome: 4 (good)    Genitourinary Genitourinary Symptoms Reported: No symptoms reported    Integumentary Integumentary Symptoms Reported: Wound Skin Management Strategies: Dressing changes, Medication therapy, Routine screening Skin Self-Management Outcome: 4 (good) Skin Comment: HH  RN coming weekly for dressing changes. Has supplies for sister to change dressing daily. Scheduled for post op visit on 04/08/24.  Musculoskeletal   Musculoskeletal Management Strategies: Routine screening, Medication therapy, Medical device, Adequate rest Musculoskeletal Self-Management Outcome: 4 (good) Musculoskeletal Comment: ambulating with walker      Psychosocial Psychosocial Symptoms Reported: Not assessed         There were no vitals filed for this visit.  Medications Reviewed Today     Reviewed by [name] LABOR, RN (Registered Nurse) on 04/07/24 at 1528  Med List Status: <None>   Medication Order Taking? Sig Documenting Provider Last Dose Status Informant  ACCU-CHEK GUIDE TEST test strip [id number] Yes USED TO TEST BLOOD SUGARS 3-4 TIMES DAILY *NEW PRESCRIPTION REQUESTDEWAINE Georgina Speaks, FNP  Active   Accu-Chek Softclix Lancets lancets [id number] Yes USED TO TEST BLOOD SUGARS 3-4 TIMES DAILY *NEW PRESCRIPTION REQUESTDEWAINE Georgina Speaks, FNP  Active   albuterol  (VENTOLIN  HFA) 108 (90 Base) MCG/ACT inhaler [id number] Yes Inhale 1-2 puffs into the lungs every 6 (six) hours as needed for wheezing or shortness of breath. Wieters, Hallie C, [name]  Active Self, Pharmacy Records  atorvastatin  (LIPITOR) 20 MG tablet [id number] Yes TAKE 1 TABLET BY MOUTH EVERY DAY *NEW PRESCRIPTION REQUESTDEWAINE Georgina, Speaks, FNP  Active   Blood Glucose Monitoring Suppl (BLOOD GLUCOSE MONITOR SYSTEM) w/Device KIT [id number] Yes Use 3 (three) times daily as directed Samtani, Jai-Gurmukh, MD  Active   cetirizine  (ALLERGY RELIEF CETIRIZINE ) 10 MG tablet [id number] Yes TAKE 1 TABLET BY MOUTH EVERY DAY *NEW PRESCRIPTION REQUESTDEWAINE Georgina Speaks, FNP  Active   ferrous sulfate  (  FEROSUL) 325 (65 FE) MG tablet [id number] Yes TAKE ONE (1) TABLET BY MOUTH TWICE DAILY *NEW PRESCRIPTION REQUESTDEWAINE Ada, Gaines, FNP  Active   fluticasone  (FLONASE ) 50 MCG/ACT nasal spray [id number] Yes Place 1-2 sprays into both nostrils daily. Ghumman,  Ramandeep, NP  Active Self, Pharmacy Records  glucose blood (ACCU-CHEK GUIDE) test strip [id number] Yes 1 each by Other route daily in the afternoon. Use as instructed Shamleffer, Ibtehal Jaralla, MD  Active Self, Pharmacy Records  Glucose Blood (BLOOD GLUCOSE TEST STRIPS) STRP [id number] Yes Use 3 (three) times daily as directed to check blood sugar. Samtani, Jai-Gurmukh, MD  Active   insulin  aspart (NOVOLOG  FLEXPEN) 100 UNIT/ML FlexPen [id number] Yes Inject 4 Units into the skin 3 (three) times daily with meals. If eating and Blood Glucose (BG) 80 or higher inject 0 units for meal coverage and add correction dose per scale. If not eating, correction dose only. BG <150= 0 unit; BG 150-200= 1 unit; BG 201-250= 2 unit; BG 251-300= 3 unit; BG 301-350= 4 unit; BG 351-400= 5 unit; BG >400= 6 unit and Call Primary Care. Samtani, Jai-Gurmukh, MD  Active   Insulin  Glargine (BASAGLAR  KWIKPEN) 100 UNIT/ML [id number] Yes INJECT 40 UNITS SUBCUTANEOUSLY 1 TIMES PER DAY *NEW PRESCRIPTION REQUESTDEWAINE [name], FNP  Active   Insulin  Pen Needle 31G X 8 MM MISC [id number] Yes 1 Device by Does not apply route daily. Shamleffer, Donell Cardinal, MD  Active Self, Pharmacy Records  Insulin  Pen Needle 32G X 4 MM MISC [id number] Yes Use as directed 3 (three) times daily. Samtani, Jai-Gurmukh, MD  Active   Lancet Device MISC [id number] Yes Use 3 (three) times daily. May dispense any manufacturer covered by patient's insurance. Samtani, Jai-Gurmukh, MD  Active   Lancets (ACCU-CHEK MULTICLIX) lancets [id number] Yes USE TO CHECK BLOOD SUGARS UP TO 4 TIMES DAILY  DX CODE:E11.9 Shamleffer, Donell Cardinal, MD  Active Self, Pharmacy Records  levonorgestrel  (MIRENA ) 20 MCG/24HR IUD [id number] Yes 1 each by Intrauterine route once. [provider]  Active Self, Pharmacy Records           Med Note (CRUTHIS, CHLOE C   Mon Mar 14, 2024  8:21 AM) Still has this inserted.   metFORMIN  (GLUCOPHAGE -XR) 750 MG 24 hr tablet [id number] Yes TAKE  2 TABLETS (1,500 MG TOTAL) BY MOUTH EVERY DAY WITH BREAKFAST *NEW PRESCRIPTION REQUESTDEWAINE Ada, Gaines, FNP  Active   methocarbamol  (ROBAXIN ) 500 MG tablet [id number] Yes TAKE 1 TABLET BY MOUTH THREE TIMES DAILY *NEW PRESCRIPTION REQUESTDEWAINE Ada, Gaines, FNP  Active   omeprazole (PRILOSEC) 20 MG capsule [id number] Yes Take 20 mg by mouth daily. [provider]  Active Self, Pharmacy Records  ondansetron  (ZOFRAN ) 8 MG tablet [id number] Yes TAKE 1 TABLET BY MOUTH EVERY 8 HOURS AS NEEDED FOR NAUSEA OR VOMITING. *NEW PRESCRIPTION REQUESTDEWAINE Ada, Gaines, FNP  Active   oxyCODONE -acetaminophen  (PERCOCET) 7.5-325 MG tablet [id number] Yes Take 2 tablets by mouth every 4 (four) hours as needed for moderate pain (pain score 4-6). [name], FNP  Active   polyethylene glycol (MIRALAX / GLYCOLAX) 17 g packet [id number] Yes Take 17 g by mouth daily. [provider]  Active   sertraline  (ZOLOFT ) 50 MG tablet [id number] Yes TAKE 1 & 1/2 TABLETS BY MOUTH ONCE DAILY *NEW PRESCRIPTION REQUESTDEWAINE Ada, Gaines, FNP  Active   triamcinolone  (KENALOG ) 0.025 % ointment [id number]  Apply 1 application topically 2 (two) times daily.  Patient not taking: Reported on 04/07/2024   Ghumman, Ramandeep, NP  Active Self, Pharmacy Records  Recommendation:   Continue Current Plan of Care  Follow Up Plan:   Telephone follow-up in 1 week  Andrea Dimes RN, BSN [place]  [facility] RN Care Manager [phone]

## 2024-04-08 ENCOUNTER — Telehealth: Payer: Self-pay

## 2024-04-08 NOTE — Progress Notes (Signed)
 Complex Care Management Note Care Guide Note  04/08/2024 Name: [name] MRN: [id number] DOB: 09-May-1981   Complex Care Management Outreach Attempts: An unsuccessful telephone outreach was attempted today to offer the patient information about available complex care management services.  Follow Up Plan:  Additional outreach attempts will be made to offer the patient complex care management information and services.   Encounter Outcome:  No Answer  Tasneem Cormier [facility]  [facility] Resource Care Guide Direct Dial : [phone]  Fax: [phone] Website: [email or web address]

## 2024-04-11 ENCOUNTER — Encounter: Payer: Self-pay | Admitting: Internal Medicine

## 2024-04-11 ENCOUNTER — Telehealth: Payer: Self-pay

## 2024-04-11 ENCOUNTER — Telehealth (INDEPENDENT_AMBULATORY_CARE_PROVIDER_SITE_OTHER): Admitting: Internal Medicine

## 2024-04-11 DIAGNOSIS — E1129 Type 2 diabetes mellitus with other diabetic kidney complication: Secondary | ICD-10-CM

## 2024-04-11 DIAGNOSIS — Z794 Long term (current) use of insulin: Secondary | ICD-10-CM

## 2024-04-11 DIAGNOSIS — E1165 Type 2 diabetes mellitus with hyperglycemia: Secondary | ICD-10-CM

## 2024-04-11 DIAGNOSIS — R809 Proteinuria, unspecified: Secondary | ICD-10-CM

## 2024-04-11 MED ORDER — INSULIN PEN NEEDLE 31G X 8 MM MISC: 1.0000 | Freq: Four times a day (QID) | 3 refills | Status: AC

## 2024-04-11 MED ORDER — BASAGLAR KWIKPEN 100 UNIT/ML ~~LOC~~ SOPN: 36.0000 [IU] | PEN_INJECTOR | Freq: Every day | SUBCUTANEOUS | 3 refills | Status: DC

## 2024-04-11 MED ORDER — DEXCOM G7 SENSOR MISC
1.0000 | 3 refills | Status: DC
Start: 2024-04-11 — End: 2024-06-27

## 2024-04-11 MED ORDER — NOVOLOG FLEXPEN 100 UNIT/ML ~~LOC~~ SOPN: 4.0000 [IU] | PEN_INJECTOR | Freq: Three times a day (TID) | SUBCUTANEOUS | 3 refills | Status: DC

## 2024-04-11 NOTE — Progress Notes (Signed)
 Complex Care Management Note Care Guide Note  04/11/2024 Name: Marlaya Turck MRN: [id number] DOB: [birth year]  Misao Toppin Buckles is a 43 y.o. year old female who is a primary care patient of Georgina Speaks, FNP . The community resource team was consulted for assistance with Transportation Needs  and Food Insecurity  SDOH screenings and interventions completed:  Yes  Social Drivers of Health From This Encounter   Food Insecurity: Food Insecurity Present (04/11/2024)   Hunger Vital Sign    Worried About Running Out of Food in the Last Year: Sometimes true    Ran Out of Food in the Last Year: Never true  Housing: Low Risk  (04/11/2024)   Housing Stability Vital Sign    Unable to Pay for Housing in the Last Year: No    Number of Times Moved in the Last Year: 0    Homeless in the Last Year: No  Financial Resource Strain: High Risk (04/11/2024)   Overall Financial Resource Strain (CARDIA)    Difficulty of Paying Living Expenses: Hard  Transportation Needs: No Transportation Needs (04/11/2024)   PRAPARE - Administrator, Civil Service (Medical): No    Lack of Transportation (Non-Medical): No  Utilities: Not At Risk (04/11/2024)   Utilities    Threatened with loss of utilities: No    SDOH Interventions Today    Flowsheet Row Most Recent Value  SDOH Interventions   Food Insecurity Interventions Other (Comment), [employer] Resources Referral  [employer] and [employer] information via FindHelp.]  Transportation Interventions Other (Comment), Statistician Referral  [employer] and [employer] information via Regulatory affairs officer .]     Care guide performed the following interventions: Patient provided with information about care guide support team and interviewed to confirm resource needs.  Follow Up Plan:  No further follow up planned at this time. The patient has been provided with needed resources.  Encounter Outcome:  Patient Visit  Completed  [facility]  [facility] Resource Care Guide Direct Dial : [phone]  Fax: [phone] Website: [email or web address]

## 2024-04-11 NOTE — Progress Notes (Signed)
 Virtual Visit via Video Note  I connected with [name] on 04/11/2024 at  2:40 PM EDT by a video enabled telemedicine application and verified that I am speaking with the correct person using two identifiers.   I discussed the limitations of evaluation and management by telemedicine and the availability of in person appointments. The patient expressed understanding and agreed to proceed.   -Location of the patient : home -Location of the provider : Office -The names of all persons participating in the telemedicine service : Pt and myself       Name: [name]  MRN/ DOB: [id number], 07-Jan-1981   Age/ Sex: 43 y.o., female    PCP: Georgina Speaks, FNP   Reason for Endocrinology Evaluation: Type 2 Diabetes Mellitus     Date of Initial Endocrinology Visit: 11/30/2020    PATIENT IDENTIFIER: [name] is a 43 y.o. female with a past medical history of T2DM, OSA, Dyslipidemia and HTN. The patient presented for initial endocrinology clinic visit on 11/30/2020 for consultative assistance with her diabetes management.    HPI: Ms. Stanaland was    Diagnosed with gestational diabetes in 2012  And never resolved after delivery Hemoglobin A1c has ranged from 7.1% in 2021, peaking at 11.1 in 2022.  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  She was lost to follow-up for approximately 18 months before her return to our clinic again.   Switched Ozempic  to Mounjaro  08/2023  I have attempted to screen her for [place] but saliva cortisol was not submitted January, 2025  She was started on prandial insulin  during hospitalization for labial abscess in August, 2025   She was off Farxiga  and Mounjaro  by September, 2025 due to high co-pay  SUBJECTIVE:   During the last visit (08/28/2023): A1c 9.3%     Today (04/11/24): Stephanie  Toppin Bryant is here for follow-up on diabetes management.  She checks her blood sugars 3 times daily.  Patient presented to the ED 03/13/2024 with bilateral labial abscess, patient was diagnosed with necrotizing fasciitis and hypovolemic shock secondary to blood loss.  Patient required wound care as well as blood transfusion   Since her last visit here she was started on prandial insulin  Patient is recovering well from necrotizing fasciitis, she is following up with the wound clinic, next follow-up is in 6 weeks She is on Zofran  due to nausea She has had constipation but this has been improving with using MiraLAX as needed  She has not been on Farxiga  nor Mounjaro  due to cost issues   HOME DIABETES REGIMEN: Metformin  750 mg XR, 2 tabs daily Farxiga  10 mg daily-not taking Mounjaro  5 mg weekly-not taking Basaglar  40 units daily  Novolog  4 units TIDQAC   Statin: yes ACE-I/ARB: yes Prior Diabetic Education: yes   METER DOWNLOAD SUMMARY:   Fasting 75-80 MGs/DL     DIABETIC COMPLICATIONS: Microvascular complications:  Denies: CKD,  Neuropathy, retinopathy  Last eye exam: Completed 2021  Macrovascular complications:   Denies: CAD, PVD, CVA   PAST HISTORY: Past Medical History:  Past Medical History:  Diagnosis Date   Abnormal Pap smear    f/u wnl   Asthma    DM2 (diabetes mellitus, type 2) (HCC)    DM2 (diabetes mellitus, type 2) (HCC) 09/13/2012   AIC 9.7 09-24-12 Referred to Dr. Jarold after  consult with Dr. Darcel Appt. Scheduled  C. Armer, CNM, FNP    Fibroid    Gestational diabetes    Hypertension    Morbid obesity (HCC)    Sleep apnea    not on cpap, diagnosed when pregnant   Urinary tract infection    Past Surgical History:  Past Surgical History:  Procedure Laterality Date   BREAST BIOPSY Right 07/24/2022   US  RT BREAST BX W LOC DEV 1ST LESION IMG BX SPEC US  GUIDE 07/24/2022 [facility] MAMMOGRAPHY   CESAREAN SECTION     DILATION AND CURETTAGE OF  UTERUS     INCISION AND DRAINAGE OF WOUND N/A 03/13/2024   Procedure: IRRIGATION AND DEBRIDEMENT WOUND;  Surgeon: Polly Cordella LABOR, MD;  Location: [facility] OR;  Service: General;  Laterality: N/A;   INCISION AND DRAINAGE PERIRECTAL ABSCESS N/A 03/16/2024   Procedure: INCISION AND DRAINAGE, ABSCESS, PERIRECTAL;  Surgeon: Lyndel Deward PARAS, MD;  Location: [facility] OR;  Service: General;  Laterality: N/A;   IRRIGATION AND DEBRIDEMENT OF NECROTIZING SOFT TISSUE INFECTION Left 03/18/2024   Procedure: EXAM UNDER ANESTHESIA; INCISION, DRAINAGE, AND DEBRIDEMENT OF NECROTIZING SOFT TISSUE INFECTION LEFT LABIA AND THIGH;  Surgeon: Stechschulte, Deward PARAS, MD;  Location: [facility] OR;  Service: General;  Laterality: Left;   RECTAL EXAM UNDER ANESTHESIA N/A 03/13/2024   Procedure: EXAM UNDER ANESTHESIA,;  Surgeon: Polly Cordella LABOR, MD;  Location[facility] OR;  Service: General;  Laterality: N/A;  I AND D left labia necrotizing infection   TUBAL LIGATION     WOUND EXPLORATION Left 03/19/2024   Procedure: EXPLORATION OF LEFT GROIN WOUND;  Surgeon: Dasie Leonor CROME, MD;  Location: [facility] OR;  Service: General;  Laterality: Left;  LEFT GROIN WOUND EXPLORATION    Social History:  reports that she quit smoking about 4 years ago. Her smoking use included cigarettes. She has quit using smokeless tobacco. She reports that she does not drink alcohol and does not use drugs. Family History:  Family History  Problem Relation Age of Onset   Allergies Mother    Asthma Mother    Hypertension Mother    Diabetes Mother    Diabetes Father    Diabetes Maternal Grandmother    Heart disease Maternal Grandmother    Liver cancer Other    Liver cancer Other    Bladder Cancer Maternal Uncle    Anesthesia problems Neg Hx    Hypotension Neg Hx    Malignant hyperthermia Neg Hx    Pseudochol deficiency Neg Hx    Hearing loss Neg Hx      HOME MEDICATIONS: Allergies as of 04/11/2024       Reactions   Farxiga  [dapagliflozin ] Other (See Comments)    necrotizing fascititis   Fish Allergy Anaphylaxis   Other Anaphylaxis, Other (See Comments)   Pt has fish allergy Pt has allergy to butter cookies   Penicillins Rash, Other (See Comments)   Told MD that PCN reaction is rash and that she has taken amoxicillin  before. Tolerated Cefepime  X1 during 8/25 admission.    Bactrim  [sulfamethoxazole -trimethoprim ] Diarrhea, Rash   Abdominal cramps        Medication List        Accurate as of April 11, 2024 11:57 AM. If you have any questions, ask your nurse or doctor.          Accu-Chek Guide test strip Generic drug: glucose blood 1 each by Other route daily in the afternoon. Use as instructed   Accu-Chek Guide Test test strip Generic  drug: glucose blood Use 3 (three) times daily as directed to check blood sugar.   Accu-Chek Guide Test test strip Generic drug: glucose blood USED TO TEST BLOOD SUGARS 3-4 TIMES DAILY *NEW PRESCRIPTION REQUEST*   Accu-Chek Guide w/Device Kit Use 3 (three) times daily as directed   accu-chek multiclix lancets USE TO CHECK BLOOD SUGARS UP TO 4 TIMES DAILY  DX CODE:E11.9   Accu-Chek Softclix Lancets lancets USED TO TEST BLOOD SUGARS 3-4 TIMES DAILY *NEW PRESCRIPTION REQUEST*   albuterol  108 (90 Base) MCG/ACT inhaler Commonly known as: VENTOLIN  HFA Inhale 1-2 puffs into the lungs every 6 (six) hours as needed for wheezing or shortness of breath.   Allergy Relief Cetirizine  10 MG tablet Generic drug: cetirizine  TAKE 1 TABLET BY MOUTH EVERY DAY *NEW PRESCRIPTION REQUEST*   atorvastatin  20 MG tablet Commonly known as: LIPITOR TAKE 1 TABLET BY MOUTH EVERY DAY *NEW PRESCRIPTION REQUEST*   Basaglar  KwikPen 100 UNIT/ML INJECT 40 UNITS SUBCUTANEOUSLY 1 TIMES PER DAY *NEW PRESCRIPTION REQUEST*   FeroSul 325 (65 Fe) MG tablet Generic drug: ferrous sulfate  TAKE ONE (1) TABLET BY MOUTH TWICE DAILY *NEW PRESCRIPTION REQUEST*   fluticasone  50 MCG/ACT nasal spray Commonly known as: FLONASE  Place  1-2 sprays into both nostrils daily.   Insulin  Pen Needle 31G X 8 MM Misc 1 Device by Does not apply route daily.   TechLite Pen Needles 32G X 4 MM Misc Generic drug: Insulin  Pen Needle Use as directed 3 (three) times daily.   Lancet Device Misc Use 3 (three) times daily. May dispense any manufacturer covered by patient's insurance.   levonorgestrel  20 MCG/24HR IUD Commonly known as: MIRENA  1 each by Intrauterine route once.   metFORMIN  750 MG 24 hr tablet Commonly known as: GLUCOPHAGE -XR TAKE 2 TABLETS (1,500 MG TOTAL) BY MOUTH EVERY DAY WITH BREAKFAST *NEW PRESCRIPTION REQUEST*   methocarbamol  500 MG tablet Commonly known as: ROBAXIN  TAKE 1 TABLET BY MOUTH THREE TIMES DAILY *NEW PRESCRIPTION REQUEST*   NovoLOG  FlexPen 100 UNIT/ML FlexPen Generic drug: insulin  aspart Inject 4 Units into the skin 3 (three) times daily with meals. If eating and Blood Glucose (BG) 80 or higher inject 0 units for meal coverage and add correction dose per scale. If not eating, correction dose only. BG <150= 0 unit; BG 150-200= 1 unit; BG 201-250= 2 unit; BG 251-300= 3 unit; BG 301-350= 4 unit; BG 351-400= 5 unit; BG >400= 6 unit and Call Primary Care.   omeprazole 20 MG capsule Commonly known as: PRILOSEC Take 20 mg by mouth daily.   ondansetron  8 MG tablet Commonly known as: ZOFRAN  TAKE 1 TABLET BY MOUTH EVERY 8 HOURS AS NEEDED FOR NAUSEA OR VOMITING. *NEW PRESCRIPTION REQUEST*   oxyCODONE -acetaminophen  7.5-325 MG tablet Commonly known as: PERCOCET Take 2 tablets by mouth every 4 (four) hours as needed for moderate pain (pain score 4-6).   polyethylene glycol 17 g packet Commonly known as: MIRALAX / GLYCOLAX Take 17 g by mouth daily.   sertraline  50 MG tablet Commonly known as: ZOLOFT  TAKE 1 & 1/2 TABLETS BY MOUTH ONCE DAILY *NEW PRESCRIPTION REQUEST*   triamcinolone  0.025 % ointment Commonly known as: KENALOG  Apply 1 application topically 2 (two) times daily.          ALLERGIES: Allergies  Allergen Reactions   Farxiga  [Dapagliflozin ] Other (See Comments)    necrotizing fascititis   Fish Allergy Anaphylaxis   Other Anaphylaxis and Other (See Comments)    Pt has fish allergy Pt has allergy to butter cookies  Penicillins Rash and Other (See Comments)    Told MD that PCN reaction is rash and that she has taken amoxicillin  before. Tolerated Cefepime  X1 during 8/25 admission.    Bactrim  [Sulfamethoxazole -Trimethoprim ] Diarrhea and Rash    Abdominal cramps        OBJECTIVE:   VITAL SIGNS: There were no vitals taken for this visit.   PHYSICAL EXAM:  General: Pt appears well and is in NAD  Lungs: Clear with good BS B/L  Heart: RRR   Extremities:  Lower extremities - No pretibial edema  Neuro: MS is good with appropriate affect, pt is alert and Ox3    DM foot exam: 08/28/2023  The skin of the feet is intact without sores or ulcerations. The pedal pulses are 2+ on right and 2+ on left. The sensation is intact to a screening 5.07, 10 gram monofilament bilaterally      DATA REVIEWED:  Lab Results  Component Value Date   HGBA1C 8.8 (H) 03/10/2024   HGBA1C 9.3 (A) 08/28/2023   HGBA1C 10.9 (H) 05/04/2023    Latest Reference Range & Units 03/25/24 15:16  Sodium 135 - 145 mmol/L 135  Potassium 3.5 - 5.1 mmol/L 5.0  Chloride 98 - 111 mmol/L 99  CO2 22 - 32 mmol/L 29  Glucose 70 - 99 mg/dL 770 (H)  BUN 6 - 20 mg/dL 9  Creatinine 9.55 - 8.99 mg/dL 9.42  Calcium  8.9 - 10.3 mg/dL 9.3  Anion gap 5 - 15  7  Alkaline Phosphatase 38 - 126 U/L 273 (H)  Albumin  3.5 - 5.0 g/dL 2.9 (L)  AST 15 - 41 U/L 35  ALT 0 - 44 U/L 43  Total Protein 6.5 - 8.1 g/dL 6.5  Total Bilirubin 0.0 - 1.2 mg/dL 0.6  GFR, Est Non African American >60 mL/min >60  Old records , labs and images have been reviewed.    ASSESSMENT / PLAN / RECOMMENDATIONS:   1) Type 2 Diabetes Mellitus, Poorly  controlled, With microalbuminuria complications - Most recent A1c  of 8.8 %. Goal A1c < 7.0 %.    -A1c has trended down but continues to be above goal -GLP-1 agonists and SGLT2 inhibitors have been cost prohibitive - A prescription for Dexcom was sent to the pharmacy - I will decrease basal insulin , as she has been noted with tight BG's in the morning - Patient encouraged to continue to use prandial insulin  with correction scale    MEDICATIONS:  -Continue Metformin  750 mg, 1 tablet twice daily  -Decrease Basaglar  36 units daily - Continue NovoLog  4 units with each meal - Continue correction factor: NovoLog  (BG -100/50)  EDUCATION / INSTRUCTIONS: BG monitoring instructions: Patient is instructed to check her blood sugars 1 times a day, fasting. Call [facility] if: BG persistently < 70  I reviewed the Rule of 15 for the treatment of hypoglycemia in detail with the patient. Literature supplied.   2) Diabetic complications:  Eye: Does not have known diabetic retinopathy.  Neuro/ Feet: Does not have known diabetic peripheral neuropathy. Renal: Patient does not have known baseline CKD. She is on an ACEI/ARB at present.   3) Microalbuminuria  -Discussed the importance of optimizing glucose control and compliance with losartan  in the past -Will monitor    F/U in 3 months     Signed electronically by: Stefano Redgie Butts, MD  [facility]  [facility] [address] [place] [place], [place] [address] Phone: [phone] FAX: [phone]  CC: Georgina Speaks, FNP [address] [place] [address] Phone: [phone]  Fax: [phone]    Return to Endocrinology clinic as below: Future Appointments  Date Time Provider Department Center  04/11/2024  2:40 PM Derec Mozingo, Donell Cardinal, MD [facility] None  04/13/2024  2:00 PM Maranda Lister D [facility] None  04/14/2024 12:45 PM [name] LABOR, RN [facility] None  04/15/2024 12:30 PM [facility]CHAIR 2 [facility] None  05/05/2024 10:00  AM Georgina Speaks, FNP [facility] [id number]  05/09/2024  4:00 PM [facility] LAB [facility] None  05/23/2024  2:10 PM [place], [name] [facility]  06/20/2024  8:15 AM [facility] LAB [facility] None  06/20/2024  8:40 AM Federico Norleen ONEIDA MADISON, MD [facility] None

## 2024-04-12 NOTE — Assessment & Plan Note (Signed)
 TCM Performed. A member of the clinical team spoke with the patient upon dischare. Discharge summary was reviewed in full detail during the visit. Meds reconciled and compared to discharge meds. Medication list is updated and reviewed with the patient.  Greater than 50% face to face time was spent in counseling an coordination of care.  All questions were answered to the satisfaction of the patient.   Overall she is doing better but she is having to have her dressing changed 2 times a day, her sister is helping her with that when the nurse is unable. She is to follow up with the wound center as well. Will refill her pain medications for a limited amount.

## 2024-04-13 ENCOUNTER — Other Ambulatory Visit: Payer: Self-pay | Admitting: Licensed Clinical Social Worker

## 2024-04-13 NOTE — Patient Instructions (Signed)

## 2024-04-13 NOTE — Patient Outreach (Signed)
 Complex Care Management   Visit Note  04/13/2024  Name:  [name] MRN: [id number] DOB: June 20, 1981  Situation: Referral received for Complex Care Management related to SDOH Barriers:  Food insecurity I obtained verbal consent from Patient.  Visit completed with Patient  on the phone  Background:   Past Medical History:  Diagnosis Date   Abnormal Pap smear    f/u wnl   Asthma    DM2 (diabetes mellitus, type 2) (HCC)    DM2 (diabetes mellitus, type 2) (HCC) 09/13/2012   AIC 9.7 09-24-12 Referred to Dr. Jarold after consult with Dr. Darcel Appt. Scheduled  C. Armer, CNM, FNP    Fibroid    Gestational diabetes    Hypertension    Morbid obesity (HCC)    Sleep apnea    not on cpap, diagnosed when pregnant   Urinary tract infection     Assessment:Patient stated that she received the food and utility resources via email form the SW and that she was also going to apply food stamps since she is asking below the guidelines for DHHS.   SDOH Screenings   Food Insecurity: Food Insecurity Present (04/11/2024)  Housing: Low Risk  (04/11/2024)  Transportation Needs: No Transportation Needs (04/11/2024)  Utilities: Not At Risk (04/11/2024)  Recent Concern: Utilities - At Risk (03/30/2024)  Depression (PHQ2-9): Medium Risk (03/25/2024)  Financial Resource Strain: High Risk (04/11/2024)  Physical Activity: Inactive (03/31/2019)  Stress: Stress Concern Present (03/31/2019)  Tobacco Use: Medium Risk (04/11/2024)   Recommendation:   none  Follow Up Plan:   Closing From:  Complex Care Management  Tobias CHARM Maranda HEDWIG, PhD [facility] Social Worker Direct Dial : [phone]  Fax: [phone]

## 2024-04-14 ENCOUNTER — Other Ambulatory Visit: Payer: Self-pay | Admitting: *Deleted

## 2024-04-14 NOTE — Patient Instructions (Signed)
 Visit Information  Thank you for taking time to visit with me today. Please don't hesitate to contact me if I can be of assistance to you before our next scheduled telephone appointment.   Following is a copy of your care plan:   Goals Addressed             This Visit's Progress    VBCI Transitions of Care (TOC) Care Plan       Problems:  Recent Hospitalization for treatment of Necrotizing Faciitis SDOH barrier: financial difficulties  Goal:  Over the next 30 days, the patient will not experience hospital readmission  Interventions:  Transitions of Care: Doctor Visits  - discussed the importance of doctor visits Post discharge activity limitations prescribed by provider reviewed Post-op wound/incision care reviewed with patient/caregiver Reviewed Signs and symptoms of infection Advised patient to discuss pain management during post op visit(heat and ice therapy) Appointments reviewed including:Surgeon in 6wks, Endocrinology in 3 mths and Infusion on 04/15/24 Discussed diet and the importance of protein in wound healing Reviewed provider notes from Endocrinology and Surgeon reviewed and discussed Medication review, discussed recent change to insulin  regimen, advised patient to contact pharmacy regarding ordered Dexcom sensor  Patient Self Care Activities:  Attend all scheduled provider appointments Call pharmacy for medication refills 3-7 days in advance of running out of medications Call provider office for new concerns or questions  Notify RN Care Manager of TOC call rescheduling needs Participate in Transition of Care Program/Attend TOC scheduled calls Take medications as prescribed    Plan:  Telephone follow up appointment with care management team member scheduled for:  04/21/24 at 12:45pm        Patient verbalizes understanding of instructions and care plan provided today and agrees to view in MyChart. Active MyChart status and patient understanding of how to access  instructions and care plan via MyChart confirmed with patient.     Telephone follow up appointment with care management team member scheduled for:04/21/24 at 12:45pm  Please call the care guide team at [phone] if you need to cancel or reschedule your appointment.   Please call [phone]-TALK (toll free, 24 hour hotline) go to [facility] [address], [place] [phone]) call 911 if you are experiencing a Mental Health or Behavioral Health Crisis or need someone to talk to.  Andrea Dimes RN, BSN [place]  [facility] RN Care Manager [phone]

## 2024-04-14 NOTE — Transitions of Care (Post Inpatient/ED Visit) (Signed)
 Transition of Care week 4  Visit Note  04/14/2024  Name: [name] MRN: [id number]          DOB: [birth year]  Situation: Patient enrolled in [facility] 30-day program. Visit completed with [name] by telephone.   Background:   Initial Transition Care Management Follow-up Telephone Call    Past Medical History:  Diagnosis Date   Abnormal Pap smear    f/u wnl   Asthma    DM2 (diabetes mellitus, type 2) (HCC)    DM2 (diabetes mellitus, type 2) (HCC) 09/13/2012   AIC 9.7 09-24-12 Referred to Dr. Jarold after consult with Dr. Darcel Appt. Scheduled  C. Armer, CNM, FNP    Fibroid    Gestational diabetes    Hypertension    Morbid obesity (HCC)    Sleep apnea    not on cpap, diagnosed when pregnant   Urinary tract infection     Assessment: Patient Reported Symptoms: Cognitive Cognitive Status: Able to follow simple commands, Normal speech and language skills, Alert and oriented to person, place, and time      Neurological Neurological Review of Symptoms: No symptoms reported    HEENT HEENT Symptoms Reported: No symptoms reported      Cardiovascular Cardiovascular Symptoms Reported: No symptoms reported    Respiratory Respiratory Symptoms Reported: No symptoms reported    Endocrine Is patient diabetic?: Yes Is patient checking blood sugars at home?: Yes List most recent blood sugar readings, include date and time of day: Fasting today 96 Endocrine Self-Management Outcome: 3 (uncertain) Endocrine Comment: Endrocrinology on 04/11/24. Has not received prescribed Dexcom. Discussed insulin  dose adjusted.  Gastrointestinal Gastrointestinal Symptoms Reported: No symptoms reported Gastrointestinal Management Strategies: Medication therapy Gastrointestinal Self-Management Outcome: 4 (good) Gastrointestinal Comment: Nausea improved with zofran .    Genitourinary Genitourinary Symptoms Reported: No symptoms reported    Integumentary Integumentary Symptoms Reported:  Wound Skin Management Strategies: Dressing changes, Routine screening, Coping strategies Skin Self-Management Outcome: 4 (good) Skin Comment: HH RN continues to perform weekly dressing changes, patient's sister performs twice daily dressing changes. Follow up with Surgeon on 9/5 and will follow up in 6wks.  Musculoskeletal   Musculoskeletal Management Strategies: Medication therapy, Routine screening, Adequate rest, Coping strategies Musculoskeletal Self-Management Outcome: 4 (good) Musculoskeletal Comment: ambulating with walker Falls in the past year?: No    Psychosocial Psychosocial Symptoms Reported: No symptoms reported         There were no vitals filed for this visit.  Medications Reviewed Today     Reviewed by [name] LABOR, RN (Registered Nurse) on 04/14/24 at 1311  Med List Status: <None>   Medication Order Taking? Sig Documenting Provider Last Dose Status Informant  ACCU-CHEK GUIDE TEST test strip [id number] Yes USED TO TEST BLOOD SUGARS 3-4 TIMES DAILY *NEW PRESCRIPTION REQUESTDEWAINE Georgina Speaks, FNP  Active   Accu-Chek Softclix Lancets lancets [id number] Yes USED TO TEST BLOOD SUGARS 3-4 TIMES DAILY *NEW PRESCRIPTION REQUESTDEWAINE Georgina, Speaks, FNP  Active   albuterol  (VENTOLIN  HFA) 108 (90 Base) MCG/ACT inhaler [id number] Yes Inhale 1-2 puffs into the lungs every 6 (six) hours as needed for wheezing or shortness of breath. Wieters, Hallie C, [name]  Active Self, Pharmacy Records  atorvastatin  (LIPITOR) 20 MG tablet [id number] Yes TAKE 1 TABLET BY MOUTH EVERY DAY *NEW PRESCRIPTION REQUESTDEWAINE Georgina, Speaks, FNP  Active   Blood Glucose Monitoring Suppl (BLOOD GLUCOSE MONITOR SYSTEM) w/Device KIT [id number] Yes Use 3 (three) times daily as directed Samtani, Jai-Gurmukh, MD  Active   cetirizine  (ALLERGY RELIEF  CETIRIZINE ) 10 MG tablet [id number] Yes TAKE 1 TABLET BY MOUTH EVERY DAY *NEW PRESCRIPTION REQUESTDEWAINE Georgina Speaks, FNP  Active   Continuous Glucose Sensor (DEXCOM G7 SENSOR) MISC  [id number]  1 Device by Does not apply route as directed. Every 10 days  Patient not taking: Reported on 04/14/2024   Shamleffer, Ibtehal Jaralla, MD  Active   ferrous sulfate  (FEROSUL) 325 (65 FE) MG tablet [id number] Yes TAKE ONE (1) TABLET BY MOUTH TWICE DAILY *NEW PRESCRIPTION REQUESTDEWAINE Georgina, Speaks, FNP  Active   fluticasone  (FLONASE ) 50 MCG/ACT nasal spray [id number] Yes Place 1-2 sprays into both nostrils daily. Ghumman, Ramandeep, NP  Active Self, Pharmacy Records  glucose blood (ACCU-CHEK GUIDE) test strip [id number] Yes 1 each by Other route daily in the afternoon. Use as instructed Shamleffer, Ibtehal Jaralla, MD  Active Self, Pharmacy Records  Glucose Blood (BLOOD GLUCOSE TEST STRIPS) STRP [id number] Yes Use 3 (three) times daily as directed to check blood sugar. Samtani, Jai-Gurmukh, MD  Active   insulin  aspart (NOVOLOG  FLEXPEN) 100 UNIT/ML FlexPen [id number] Yes Inject 4 Units into the skin 3 (three) times daily with meals. If eating and Blood Glucose (BG) 80 or higher inject 0 units for meal coverage and add correction dose per scale. If not eating, correction dose only. BG <150= 0 unit; BG 150-200= 1 unit; BG 201-250= 2 unit; BG 251-300= 3 unit; BG 301-350= 4 unit; BG 351-400= 5 unit Shamleffer, Ibtehal Jaralla, MD  Active   Insulin  Glargine (BASAGLAR  KWIKPEN) 100 UNIT/ML [id number] Yes Inject 36 Units into the skin daily. Shamleffer, Donell Cardinal, MD  Active   Insulin  Pen Needle 31G X 8 MM MISC [id number] Yes 1 Device by Does not apply route in the morning, at noon, in the evening, and at bedtime. Shamleffer, Donell Cardinal, MD  Active   Lancet Device MISC [id number] Yes Use 3 (three) times daily. May dispense any manufacturer covered by patient's insurance. Samtani, Jai-Gurmukh, MD  Active   Lancets (ACCU-CHEK MULTICLIX) lancets [id number] Yes USE TO CHECK BLOOD SUGARS UP TO 4 TIMES DAILY  DX CODE:E11.9 Shamleffer, Donell Cardinal, MD  Active Self, Pharmacy Records  levonorgestrel  (MIRENA ) 20  MCG/24HR IUD [id number] Yes 1 each by Intrauterine route once. [provider]  Active Self, Pharmacy Records           Med Note (CRUTHIS, CHLOE C   Mon Mar 14, 2024  8:21 AM) Still has this inserted.   metFORMIN  (GLUCOPHAGE -XR) 750 MG 24 hr tablet [id number] Yes TAKE 2 TABLETS (1,500 MG TOTAL) BY MOUTH EVERY DAY WITH BREAKFAST *NEW PRESCRIPTION REQUESTDEWAINE Georgina, Speaks, FNP  Active   methocarbamol  (ROBAXIN ) 500 MG tablet [id number] Yes TAKE 1 TABLET BY MOUTH THREE TIMES DAILY *NEW PRESCRIPTION REQUESTDEWAINE Georgina, Speaks, FNP  Active   omeprazole (PRILOSEC) 20 MG capsule [id number] Yes Take 20 mg by mouth daily. [provider]  Active Self, Pharmacy Records  ondansetron  (ZOFRAN ) 8 MG tablet [id number] Yes TAKE 1 TABLET BY MOUTH EVERY 8 HOURS AS NEEDED FOR NAUSEA OR VOMITING. *NEW PRESCRIPTION REQUESTDEWAINE Georgina, Speaks, FNP  Active   oxyCODONE -acetaminophen  (PERCOCET) 7.5-325 MG tablet [id number]  Take 2 tablets by mouth every 4 (four) hours as needed for moderate pain (pain score 4-6).  Patient not taking: Reported on 04/14/2024   Georgina Speaks, FNP  Active   oxyCODONE -acetaminophen  (PERCOCET/ROXICET) 5-325 MG tablet [id number] Yes Take 1 tablet by mouth every 6 (six) hours as needed for severe pain (pain score 7-10). [provider]  Active   polyethylene glycol (MIRALAX /  GLYCOLAX) 17 g packet [id number] Yes Take 17 g by mouth daily. [provider]  Active   sertraline  (ZOLOFT ) 50 MG tablet [id number] Yes TAKE 1 & 1/2 TABLETS BY MOUTH ONCE DAILY *NEW PRESCRIPTION REQUESTDEWAINE Ada, Gaines, FNP  Active   triamcinolone  (KENALOG ) 0.025 % ointment [id number]  Apply 1 application topically 2 (two) times daily.  Patient not taking: Reported on 04/14/2024   Ghumman, Ramandeep, NP  Active Self, Pharmacy Records            Recommendation:   Continue Current Plan of Care  Follow Up Plan:   Telephone follow-up in 1 week  Andrea Dimes RN, BSN [place]  Value-Based Care  [facility] RN Care Manager [phone]

## 2024-04-15 ENCOUNTER — Ambulatory Visit

## 2024-04-15 VITALS — BP 117/77 | HR 92 | Temp 98.2°F | Resp 22 | Ht 65.0 in | Wt 302.4 lb

## 2024-04-15 DIAGNOSIS — D508 Other iron deficiency anemias: Secondary | ICD-10-CM

## 2024-04-15 MED ORDER — IRON SUCROSE 20 MG/ML IV SOLN
200.0000 mg | Freq: Once | INTRAVENOUS | Status: AC
  Administered 2024-04-15: 200 mg via INTRAVENOUS
  Filled 2024-04-15: qty 10

## 2024-04-15 NOTE — Progress Notes (Signed)
 Diagnosis: Iron Deficiency Anemia  Provider:  Chilton Greathouse MD  Procedure: IV Push  IV Type: Peripheral, IV Location: R Antecubital  Venofer (Iron Sucrose), Dose: 200 mg  Post Infusion IV Care: Observation period completed and Peripheral IV Discontinued  Discharge: Condition: Good, Destination: Home . AVS Declined  Performed by:  Adriana Mccallum, RN

## 2024-04-21 ENCOUNTER — Encounter: Payer: Self-pay | Admitting: *Deleted

## 2024-04-21 ENCOUNTER — Other Ambulatory Visit: Payer: Self-pay | Admitting: *Deleted

## 2024-04-21 NOTE — Transitions of Care (Post Inpatient/ED Visit) (Signed)
 Transition of Care week 5  Visit Note  04/21/2024  Name: [name] MRN: [id number]          DOB: [birth year]  Situation: Patient enrolled in [facility] 30-day program. Visit completed with [name] by telephone.   Background:   Initial Transition Care Management Follow-up Telephone Call    Past Medical History:  Diagnosis Date   Abnormal Pap smear    f/u wnl   Asthma    DM2 (diabetes mellitus, type 2) (HCC)    DM2 (diabetes mellitus, type 2) (HCC) 09/13/2012   AIC 9.7 09-24-12 Referred to Dr. Jarold after consult with Dr. Darcel Appt. Scheduled  C. Armer, CNM, FNP    Fibroid    Gestational diabetes    Hypertension    Morbid obesity (HCC)    Sleep apnea    not on cpap, diagnosed when pregnant   Urinary tract infection     Assessment: Patient Reported Symptoms: Cognitive Cognitive Status: Able to follow simple commands, Normal speech and language skills, Alert and oriented to person, place, and time      Neurological Neurological Review of Symptoms: No symptoms reported    HEENT HEENT Symptoms Reported: Not assessed      Cardiovascular Cardiovascular Symptoms Reported: No symptoms reported    Respiratory Respiratory Symptoms Reported: No symptoms reported    Endocrine Is patient diabetic?: Yes Is patient checking blood sugars at home?: Yes List most recent blood sugar readings, include date and time of day: Fasting today 122    Gastrointestinal Gastrointestinal Symptoms Reported: Nausea, Vomiting, Diarrhea Additional Gastrointestinal Details: Nausea and Vomiting this morning. Feeling better now. Feels N/V related to cheese that she ate last night. Gastrointestinal Self-Management Outcome: 3 (uncertain) Gastrointestinal Comment: RNCM discussed the importance of rehydrating by increasing water  intake.    Genitourinary Genitourinary Symptoms Reported: No symptoms reported    Integumentary Integumentary Symptoms Reported: Wound Skin Management Strategies:  Medication therapy, Coping strategies, Routine screening, Exercise, Dressing changes, Adequate rest Skin Self-Management Outcome: 4 (good) Skin Comment: Continues to perform dressing changes twice daily, HH RN weekly, follow up with surgeon 05/20/24. Showering twice daily, using dial  soap.  Musculoskeletal Musculoskelatal Symptoms Reviewed: Weakness Musculoskeletal Self-Management Outcome: 4 (good) Musculoskeletal Comment: ambulating with walker, taking a walk outside everyday      Psychosocial Psychosocial Symptoms Reported: Not assessed         There were no vitals filed for this visit.  Medications Reviewed Today     Reviewed by [name] LABOR, RN (Registered Nurse) on 04/21/24 at 1616  Med List Status: <None>   Medication Order Taking? Sig Documenting Provider Last Dose Status Informant  ACCU-CHEK GUIDE TEST test strip [id number] Yes USED TO TEST BLOOD SUGARS 3-4 TIMES DAILY *NEW PRESCRIPTION REQUESTDEWAINE Georgina Speaks, FNP  Active   Accu-Chek Softclix Lancets lancets [id number] Yes USED TO TEST BLOOD SUGARS 3-4 TIMES DAILY *NEW PRESCRIPTION REQUESTDEWAINE Georgina Speaks, FNP  Active   albuterol  (VENTOLIN  HFA) 108 (90 Base) MCG/ACT inhaler [id number] Yes Inhale 1-2 puffs into the lungs every 6 (six) hours as needed for wheezing or shortness of breath. Wieters, Hallie C, [name]  Active Self, Pharmacy Records  atorvastatin  (LIPITOR) 20 MG tablet [id number] Yes TAKE 1 TABLET BY MOUTH EVERY DAY *NEW PRESCRIPTION REQUESTDEWAINE Georgina, Speaks, FNP  Active   Blood Glucose Monitoring Suppl (BLOOD GLUCOSE MONITOR SYSTEM) w/Device KIT [id number] Yes Use 3 (three) times daily as directed Samtani, Jai-Gurmukh, MD  Active   cetirizine  (ALLERGY RELIEF CETIRIZINE ) 10 MG tablet [id number] Yes  TAKE 1 TABLET BY MOUTH EVERY DAY *NEW PRESCRIPTION REQUESTDEWAINE Georgina Speaks, FNP  Active   Continuous Glucose Sensor (DEXCOM G7 SENSOR) MISC [id number]  1 Device by Does not apply route as directed. Every 10 days  Patient not taking:  Reported on 04/21/2024   Shamleffer, Ibtehal Jaralla, MD  Active   ferrous sulfate  (FEROSUL) 325 (65 FE) MG tablet [id number] Yes TAKE ONE (1) TABLET BY MOUTH TWICE DAILY *NEW PRESCRIPTION REQUESTDEWAINE Georgina, Speaks, FNP  Active   fluticasone  (FLONASE ) 50 MCG/ACT nasal spray [id number] Yes Place 1-2 sprays into both nostrils daily. Ghumman, Ramandeep, NP  Active Self, Pharmacy Records  glucose blood (ACCU-CHEK GUIDE) test strip [id number] Yes 1 each by Other route daily in the afternoon. Use as instructed Shamleffer, Ibtehal Jaralla, MD  Active Self, Pharmacy Records  Glucose Blood (BLOOD GLUCOSE TEST STRIPS) STRP [id number] Yes Use 3 (three) times daily as directed to check blood sugar. Samtani, Jai-Gurmukh, MD  Active   insulin  aspart (NOVOLOG  FLEXPEN) 100 UNIT/ML FlexPen [id number] Yes Inject 4 Units into the skin 3 (three) times daily with meals. If eating and Blood Glucose (BG) 80 or higher inject 0 units for meal coverage and add correction dose per scale. If not eating, correction dose only. BG <150= 0 unit; BG 150-200= 1 unit; BG 201-250= 2 unit; BG 251-300= 3 unit; BG 301-350= 4 unit; BG 351-400= 5 unit Shamleffer, Ibtehal Jaralla, MD  Active   Insulin  Glargine (BASAGLAR  KWIKPEN) 100 UNIT/ML [id number] Yes Inject 36 Units into the skin daily. Shamleffer, Donell Cardinal, MD  Active   Insulin  Pen Needle 31G X 8 MM MISC [id number] Yes 1 Device by Does not apply route in the morning, at noon, in the evening, and at bedtime. Shamleffer, Donell Cardinal, MD  Active   Lancet Device MISC [id number] Yes Use 3 (three) times daily. May dispense any manufacturer covered by patient's insurance. Samtani, Jai-Gurmukh, MD  Active   Lancets (ACCU-CHEK MULTICLIX) lancets [id number] Yes USE TO CHECK BLOOD SUGARS UP TO 4 TIMES DAILY  DX CODE:E11.9 Shamleffer, Donell Cardinal, MD  Active Self, Pharmacy Records  levonorgestrel  (MIRENA ) 20 MCG/24HR IUD [id number] Yes 1 each by Intrauterine route once. [provider]   Active Self, Pharmacy Records           Med Note (CRUTHIS, CHLOE C   Mon Mar 14, 2024  8:21 AM) Still has this inserted.   metFORMIN  (GLUCOPHAGE -XR) 750 MG 24 hr tablet [id number] Yes TAKE 2 TABLETS (1,500 MG TOTAL) BY MOUTH EVERY DAY WITH BREAKFAST *NEW PRESCRIPTION REQUESTDEWAINE Georgina, Speaks, FNP  Active   methocarbamol  (ROBAXIN ) 500 MG tablet [id number] Yes TAKE 1 TABLET BY MOUTH THREE TIMES DAILY *NEW PRESCRIPTION REQUESTDEWAINE Georgina, Speaks, FNP  Active   omeprazole (PRILOSEC) 20 MG capsule [id number] Yes Take 20 mg by mouth daily. [provider]  Active Self, Pharmacy Records  ondansetron  (ZOFRAN ) 8 MG tablet [id number] Yes TAKE 1 TABLET BY MOUTH EVERY 8 HOURS AS NEEDED FOR NAUSEA OR VOMITING. *NEW PRESCRIPTION REQUESTDEWAINE Georgina, Speaks, FNP  Active   oxyCODONE -acetaminophen  (PERCOCET) 7.5-325 MG tablet [id number]  Take 2 tablets by mouth every 4 (four) hours as needed for moderate pain (pain score 4-6).  Patient not taking: Reported on 04/21/2024   Georgina Speaks, FNP  Active   oxyCODONE -acetaminophen  (PERCOCET/ROXICET) 5-325 MG tablet [id number] Yes Take 1 tablet by mouth every 6 (six) hours as needed for severe pain (pain score 7-10). [provider]  Active   polyethylene glycol (MIRALAX / GLYCOLAX) 17 g packet [id number] Yes  Take 17 g by mouth daily. [provider]  Active   sertraline  (ZOLOFT ) 50 MG tablet [id number] Yes TAKE 1 & 1/2 TABLETS BY MOUTH ONCE DAILY *NEW PRESCRIPTION REQUESTDEWAINE Ada, Gaines, FNP  Active   triamcinolone  (KENALOG ) 0.025 % ointment [id number] Yes Apply 1 application topically 2 (two) times daily.  Patient taking differently: Apply 1 application  topically as needed.   Ghumman, Ramandeep, NP  Active Self, Pharmacy Records            Recommendation:   Continue Current Plan of Care  Follow Up Plan:   Telephone follow-up in 1 week  Andrea Dimes RN, BSN [place]  Value-Based Care [facility] RN Care  Manager [phone]

## 2024-04-21 NOTE — Patient Instructions (Signed)
 Visit Information  Thank you for taking time to visit with me today. Please don't hesitate to contact me if I can be of assistance to you before our next scheduled telephone appointment.   Following is a copy of your care plan:   Goals Addressed             This Visit's Progress    VBCI Transitions of Care (TOC) Care Plan       Problems:  Recent Hospitalization for treatment of Necrotizing Faciitis SDOH barrier: financial difficulties  Goal:  Over the next 30 days, the patient will not experience hospital readmission  Interventions:  Transitions of Care: Doctor Visits  - discussed the importance of doctor visits Post discharge activity limitations prescribed by provider reviewed Post-op wound/incision care reviewed with patient/caregiver Reviewed Signs and symptoms of infection Appointments reviewed including:Surgeon 05/20/24, Endocrinology in 3 mths and Infusion on 04/22/24 Discussed diet and the importance of protein in wound healing Medication review, advised patient to contact pharmacy regarding ordered Dexcom sensor   Patient Self Care Activities:  Attend all scheduled provider appointments Call pharmacy for medication refills 3-7 days in advance of running out of medications Call provider office for new concerns or questions  Notify RN Care Manager of TOC call rescheduling needs Participate in Transition of Care Program/Attend TOC scheduled calls Take medications as prescribed    Plan:  Telephone follow up appointment with care management team member scheduled for:  04/28/24 at 1:30 pm        Patient verbalizes understanding of instructions and care plan provided today and agrees to view in MyChart. Active MyChart status and patient understanding of how to access instructions and care plan via MyChart confirmed with patient.     Telephone follow up appointment with care management team member scheduled for:04/28/24 at 1:30pm  Please call the care guide team at  [phone] if you need to cancel or reschedule your appointment.   Please call 1-800-273-TALK (toll free, 24 hour hotline) go to [facility] Urgent [facility] [address] [place] [phone]) call 911 if you are experiencing a Mental Health or Behavioral Health Crisis or need someone to talk to.  Andrea Dimes RN, BSN [place]  Value-Based Care [facility] RN Care Manager [phone]

## 2024-04-22 ENCOUNTER — Ambulatory Visit (INDEPENDENT_AMBULATORY_CARE_PROVIDER_SITE_OTHER)

## 2024-04-22 VITALS — BP 107/73 | HR 95 | Temp 97.7°F | Resp 18 | Ht 65.0 in | Wt 298.8 lb

## 2024-04-22 DIAGNOSIS — D649 Anemia, unspecified: Secondary | ICD-10-CM

## 2024-04-22 DIAGNOSIS — D508 Other iron deficiency anemias: Secondary | ICD-10-CM

## 2024-04-22 MED ORDER — IRON SUCROSE 20 MG/ML IV SOLN
200.0000 mg | Freq: Once | INTRAVENOUS | Status: AC
  Administered 2024-04-22: 200 mg via INTRAVENOUS
  Filled 2024-04-22: qty 10

## 2024-04-22 NOTE — Progress Notes (Signed)
 Diagnosis: Iron Deficiency Anemia  Provider:  Chilton Greathouse MD  Procedure: IV Push  IV Type: Peripheral, IV Location: L Antecubital  Venofer (Iron Sucrose), Dose: 200 mg  Post Infusion IV Care: Observation period completed and Peripheral IV Discontinued  Discharge: Condition: Good, Destination: Home . AVS Declined  Performed by:  Wyvonne Lenz, RN

## 2024-04-28 ENCOUNTER — Other Ambulatory Visit: Payer: Self-pay | Admitting: *Deleted

## 2024-04-28 VITALS — BP 135/82

## 2024-04-28 DIAGNOSIS — E119 Type 2 diabetes mellitus without complications: Secondary | ICD-10-CM

## 2024-04-28 NOTE — Patient Instructions (Signed)
 Visit Information  Thank you for taking time to visit with me today. Please don't hesitate to contact me if I can be of assistance to you before our next scheduled telephone appointment.   Following is a copy of your care plan:   Goals Addressed             This Visit's Progress    COMPLETED: VBCI Transitions of Care (TOC) Care Plan       Problems:  Recent Hospitalization for treatment of Necrotizing Faciitis SDOH barrier: financial difficulties  Goal: MET Over the next 30 days, the patient will not experience hospital readmission  Interventions:  Transitions of Care: Doctor Visits  - discussed the importance of doctor visits Referral to Longitudinal Nurse Case Manager for Ongoing follow-up Post discharge activity limitations prescribed by provider reviewed Post-op wound/incision care reviewed with patient/caregiver Reviewed Signs and symptoms of infection Appointments reviewed including:Surgeon 05/20/24, Endocrinology in 3 mths, PCP on 05/05/24 and Infusion on 05/02/24 Discussed diet and the importance of protein in wound healing Medication review Reviewed recent BP and BS readings   Patient Self Care Activities:  Attend all scheduled provider appointments Call pharmacy for medication refills 3-7 days in advance of running out of medications Call provider office for new concerns or questions  Notify RN Care Manager of TOC call rescheduling needs Participate in Transition of Care Program/Attend TOC scheduled calls Take medications as prescribed    Plan:  Telephone follow up appointment with care management team member scheduled for:  04/28/24 at 1:30 pm        Patient verbalizes understanding of instructions and care plan provided today and agrees to view in MyChart. Active MyChart status and patient understanding of how to access instructions and care plan via MyChart confirmed with patient.     The care management team will reach out to the patient again over the  next 30 days.   Please call the care guide team at [phone] if you need to cancel or reschedule your appointment.   Please call 1-800-273-TALK (toll free, 24 hour hotline) go to [facility]gent [facility] [address], [place] [phone]) call 911 if you are experiencing a Mental Health or Behavioral Health Crisis or need someone to talk to.  Andrea Dimes RN, BSN [place]  Value-Based Care [facility] RN Care Manager [phone]

## 2024-04-28 NOTE — Transitions of Care (Post Inpatient/ED Visit) (Signed)
 Transition of Care week 6  Visit Note  04/28/2024  Name: [name] MRN: [id number]          DOB: 08/28/1980  Situation: Patient enrolled in [facility] 30-day program. Visit completed with [name] by telephone.   Background:   Initial Transition Care Management Follow-up Telephone Call    Past Medical History:  Diagnosis Date   Abnormal Pap smear    f/u wnl   Asthma    DM2 (diabetes mellitus, type 2) (HCC)    DM2 (diabetes mellitus, type 2) (HCC) 09/13/2012   AIC 9.7 09-24-12 Referred to Dr. Jarold after consult with Dr. Darcel Appt. Scheduled  C. Armer, CNM, FNP    Fibroid    Gestational diabetes    Hypertension    Morbid obesity (HCC)    Sleep apnea    not on cpap, diagnosed when pregnant   Urinary tract infection     Assessment: Patient Reported Symptoms: Cognitive Cognitive Status: Able to follow simple commands, Normal speech and language skills, Alert and oriented to person, place, and time      Neurological Neurological Review of Symptoms: No symptoms reported    HEENT HEENT Symptoms Reported: Not assessed      Cardiovascular Cardiovascular Symptoms Reported: No symptoms reported    Respiratory Respiratory Symptoms Reported: No symptoms reported    Endocrine Endocrine Symptoms Reported: No symptoms reported Is patient diabetic?: Yes Is patient checking blood sugars at home?: Yes List most recent blood sugar readings, include date and time of day: BS today 103 Endocrine Self-Management Outcome: 4 (good)  Gastrointestinal Gastrointestinal Symptoms Reported: No symptoms reported Additional Gastrointestinal Details: LBM 04/28/24 Gastrointestinal Self-Management Outcome: 4 (good)    Genitourinary Genitourinary Symptoms Reported: No symptoms reported    Integumentary Integumentary Symptoms Reported: Wound Skin Management Strategies: Medication therapy, Routine screening, Adequate rest, Dressing changes Skin Self-Management Outcome: 4 (good) Skin  Comment: Continues with dressing changes twice daily, HH RN weekly. Wound is achy.  Musculoskeletal Musculoskelatal Symptoms Reviewed: Weakness Musculoskeletal Management Strategies: Adequate rest, Medication therapy, Routine screening, Coping strategies Musculoskeletal Self-Management Outcome: 4 (good) Musculoskeletal Comment: increasing activity, weaning off using walker. Sister is working with her to exercise.      Psychosocial Psychosocial Symptoms Reported: Not assessed         Vitals:   04/28/24 1351  BP: 135/82    Medications Reviewed Today     Reviewed by [name] LABOR, RN (Registered Nurse) on 04/28/24 at 1346  Med List Status: <None>   Medication Order Taking? Sig Documenting Provider Last Dose Status Informant  ACCU-CHEK GUIDE TEST test strip [id number] Yes USED TO TEST BLOOD SUGARS 3-4 TIMES DAILY *NEW PRESCRIPTION REQUESTDEWAINE Georgina Speaks, FNP  Active   Accu-Chek Softclix Lancets lancets [id number] Yes USED TO TEST BLOOD SUGARS 3-4 TIMES DAILY *NEW PRESCRIPTION REQUESTDEWAINE Georgina, Speaks, FNP  Active   albuterol  (VENTOLIN  HFA) 108 (90 Base) MCG/ACT inhaler [id number] Yes Inhale 1-2 puffs into the lungs every 6 (six) hours as needed for wheezing or shortness of breath. Wieters, Hallie C, [name]  Active Self, Pharmacy Records  atorvastatin  (LIPITOR) 20 MG tablet [id number] Yes TAKE 1 TABLET BY MOUTH EVERY DAY *NEW PRESCRIPTION REQUESTDEWAINE Georgina, Speaks, FNP  Active   Blood Glucose Monitoring Suppl (BLOOD GLUCOSE MONITOR SYSTEM) w/Device KIT [id number] Yes Use 3 (three) times daily as directed Samtani, Jai-Gurmukh, MD  Active   cetirizine  (ALLERGY RELIEF CETIRIZINE ) 10 MG tablet [id number] Yes TAKE 1 TABLET BY MOUTH EVERY DAY *NEW PRESCRIPTION REQUESTDEWAINE Georgina Speaks, FNP  Active   Continuous Glucose Sensor (DEXCOM G7 SENSOR) MISC [id number]  1 Device by Does not apply route as directed. Every 10 days  Patient not taking: Reported on 04/28/2024   Shamleffer, Ibtehal Jaralla, MD  Active    ferrous sulfate  [facility]) 325 (65 FE) MG tablet [id number] Yes TAKE ONE (1) TABLET BY MOUTH TWICE DAILY *NEW PRESCRIPTION REQUESTDEWAINE Ada, Gaines, FNP  Active   fluticasone  (FLONASE ) 50 MCG/ACT nasal spray [id number] Yes Place 1-2 sprays into both nostrils daily. Ghumman, Ramandeep, NP  Active Self, Pharmacy Records  glucose blood (ACCU-CHEK GUIDE) test strip [id number] Yes 1 each by Other route daily in the afternoon. Use as instructed Shamleffer, Ibtehal Jaralla, MD  Active Self, Pharmacy Records  Glucose Blood (BLOOD GLUCOSE TEST STRIPS) STRP [id number] Yes Use 3 (three) times daily as directed to check blood sugar. Samtani, Jai-Gurmukh, MD  Active   insulin  aspart (NOVOLOG  FLEXPEN) 100 UNIT/ML FlexPen [id number] Yes Inject 4 Units into the skin 3 (three) times daily with meals. If eating and Blood Glucose (BG) 80 or higher inject 0 units for meal coverage and add correction dose per scale. If not eating, correction dose only. BG <150= 0 unit; BG 150-200= 1 unit; BG 201-250= 2 unit; BG 251-300= 3 unit; BG 301-350= 4 unit; BG 351-400= 5 unit Shamleffer, Ibtehal Jaralla, MD  Active   Insulin  Glargine (BASAGLAR  KWIKPEN) 100 UNIT/ML [id number] Yes Inject 36 Units into the skin daily. Shamleffer, Donell Cardinal, MD  Active   Insulin  Pen Needle 31G X 8 MM MISC [id number] Yes 1 Device by Does not apply route in the morning, at noon, in the evening, and at bedtime. Shamleffer, Donell Cardinal, MD  Active   Lancet Device MISC [id number] Yes Use 3 (three) times daily. May dispense any manufacturer covered by patient's insurance. Samtani, Jai-Gurmukh, MD  Active   Lancets (ACCU-CHEK MULTICLIX) lancets [id number]  USE TO CHECK BLOOD SUGARS UP TO 4 TIMES DAILY  DX CODE:E11.9  Patient not taking: Reported on 04/28/2024   Shamleffer, Donell Cardinal, MD  Consider Medication Status and Discontinue (No longer needed (for PRN medications)) Self, Pharmacy Records  levonorgestrel  (MIRENA ) 20 MCG/24HR IUD [id number] Yes 1 each by  Intrauterine route once. [provider]  Active Self, Pharmacy Records           Med Note (CRUTHIS, CHLOE C   Mon Mar 14, 2024  8:21 AM) Still has this inserted.   metFORMIN  (GLUCOPHAGE -XR) 750 MG 24 hr tablet [id number] Yes TAKE 2 TABLETS (1,500 MG TOTAL) BY MOUTH EVERY DAY WITH BREAKFAST *NEW PRESCRIPTION REQUESTDEWAINE Ada, Gaines, FNP  Active   methocarbamol  (ROBAXIN ) 500 MG tablet [id number] Yes TAKE 1 TABLET BY MOUTH THREE TIMES DAILY *NEW PRESCRIPTION REQUESTDEWAINE Ada, Gaines, FNP  Active   omeprazole (PRILOSEC) 20 MG capsule [id number] Yes Take 20 mg by mouth daily. [provider]  Active Self, Pharmacy Records  ondansetron  (ZOFRAN ) 8 MG tablet [id number] Yes TAKE 1 TABLET BY MOUTH EVERY 8 HOURS AS NEEDED FOR NAUSEA OR VOMITING. *NEW PRESCRIPTION REQUESTDEWAINE Ada, Gaines, FNP  Active   oxyCODONE -acetaminophen  (PERCOCET) 7.5-325 MG tablet [id number]  Take 2 tablets by mouth every 4 (four) hours as needed for moderate pain (pain score 4-6).  Patient not taking: Reported on 04/28/2024   [name], FNP  Active   oxyCODONE -acetaminophen  (PERCOCET/ROXICET) 5-325 MG tablet [id number] Yes Take 1 tablet by mouth every 6 (six) hours as needed for severe pain (pain score 7-10). [provider]  Active   polyethylene glycol (MIRALAX / GLYCOLAX)  17 g packet [id number]  Take 17 g by mouth daily.  Patient not taking: Reported on 04/28/2024   [provider]  Active   sertraline  (ZOLOFT ) 50 MG tablet [id number] Yes TAKE 1 & 1/2 TABLETS BY MOUTH ONCE DAILY *NEW PRESCRIPTION REQUESTDEWAINE Ada, Gaines, FNP  Active   triamcinolone  (KENALOG ) 0.025 % ointment [id number] Yes Apply 1 application topically 2 (two) times daily.  Patient taking differently: Apply 1 application  topically as needed.   Ghumman, Ramandeep, NP  Active Self, Pharmacy Records            Recommendation:   Continue Current Plan of Care  Follow Up Plan:   Referral to RN Case Manager Closing From:  Transitions  of Care Program  Andrea Dimes RN, BSN [place]  [facility] RN Care Manager [phone]

## 2024-04-29 ENCOUNTER — Telehealth: Payer: Self-pay

## 2024-04-29 NOTE — Progress Notes (Signed)
 Complex Care Management Note  Care Guide Note 04/29/2024 Name: [name] MRN: [id number] DOB: [birth year]  [name] is a 43 y.o. year old female who sees Georgina Speaks, FNP for primary care. I reached out to [name] by phone today to offer complex care management services.  [name] was given information about Complex Care Management services today including:   The Complex Care Management services include support from the care team which includes your Nurse Care Manager, Clinical Social Worker, or Pharmacist.  The Complex Care Management team is here to help remove barriers to the health concerns and goals most important to you. Complex Care Management services are voluntary, and the patient may decline or stop services at any time by request to their care team member.   Complex Care Management Consent Status: Patient agreed to services and verbal consent obtained.   Follow up plan:  Telephone appointment with complex care management team member scheduled for:  05/19/24 @ 11AM  Encounter Outcome:  Patient Scheduled  Leotis Rase [facility], [facility] Guide  Direct Dial : [phone]  Fax [phone]

## 2024-05-02 ENCOUNTER — Ambulatory Visit

## 2024-05-02 VITALS — BP 129/87 | HR 91 | Temp 98.3°F | Resp 18 | Ht 65.0 in | Wt 307.8 lb

## 2024-05-02 DIAGNOSIS — D508 Other iron deficiency anemias: Secondary | ICD-10-CM

## 2024-05-02 DIAGNOSIS — D649 Anemia, unspecified: Secondary | ICD-10-CM

## 2024-05-02 MED ORDER — SODIUM CHLORIDE 0.9 % IV SOLN: Freq: Once | INTRAVENOUS | Status: AC

## 2024-05-02 MED ORDER — IRON SUCROSE 20 MG/ML IV SOLN
200.0000 mg | Freq: Once | INTRAVENOUS | Status: AC
  Administered 2024-05-02: 200 mg via INTRAVENOUS
  Filled 2024-05-02: qty 10

## 2024-05-02 NOTE — Progress Notes (Signed)
 Diagnosis: Iron  Deficiency Anemia  Provider:  Praveen Mannam MD  Procedure: IV Push  IV Type: Peripheral, IV Location: L Antecubital  Venofer  (Iron  Sucrose), Dose: 200 mg  Post Infusion IV Care: Observation period completed and Peripheral IV Discontinued  Discharge: Condition: Good, Destination: Home . AVS Declined  Performed by:  Maximiano JONELLE Pouch, LPN

## 2024-05-05 ENCOUNTER — Encounter: Payer: Self-pay | Admitting: Nurse Practitioner

## 2024-05-05 ENCOUNTER — Ambulatory Visit (INDEPENDENT_AMBULATORY_CARE_PROVIDER_SITE_OTHER): Payer: Self-pay | Admitting: Nurse Practitioner

## 2024-05-05 ENCOUNTER — Other Ambulatory Visit: Payer: Self-pay

## 2024-05-05 ENCOUNTER — Other Ambulatory Visit: Payer: Self-pay | Admitting: Physician Assistant

## 2024-05-05 ENCOUNTER — Encounter: Payer: Self-pay | Admitting: Physician Assistant

## 2024-05-05 VITALS — BP 140/80 | HR 107 | Temp 99.5°F | Ht 65.0 in | Wt 307.8 lb

## 2024-05-05 DIAGNOSIS — I10 Essential (primary) hypertension: Secondary | ICD-10-CM

## 2024-05-05 DIAGNOSIS — K7460 Unspecified cirrhosis of liver: Secondary | ICD-10-CM | POA: Diagnosis not present

## 2024-05-05 DIAGNOSIS — Z0000 Encounter for general adult medical examination without abnormal findings: Secondary | ICD-10-CM

## 2024-05-05 DIAGNOSIS — E1169 Type 2 diabetes mellitus with other specified complication: Secondary | ICD-10-CM

## 2024-05-05 DIAGNOSIS — K769 Liver disease, unspecified: Secondary | ICD-10-CM

## 2024-05-05 DIAGNOSIS — N7682 Fournier disease of vagina and vulva: Secondary | ICD-10-CM

## 2024-05-05 DIAGNOSIS — Z139 Encounter for screening, unspecified: Secondary | ICD-10-CM

## 2024-05-05 DIAGNOSIS — Z794 Long term (current) use of insulin: Secondary | ICD-10-CM

## 2024-05-05 DIAGNOSIS — E782 Mixed hyperlipidemia: Secondary | ICD-10-CM | POA: Diagnosis not present

## 2024-05-05 DIAGNOSIS — E119 Type 2 diabetes mellitus without complications: Secondary | ICD-10-CM

## 2024-05-05 DIAGNOSIS — Z1231 Encounter for screening mammogram for malignant neoplasm of breast: Secondary | ICD-10-CM

## 2024-05-05 DIAGNOSIS — Z79899 Other long term (current) drug therapy: Secondary | ICD-10-CM

## 2024-05-05 MED ORDER — ACCU-CHEK GUIDE TEST VI STRP: ORAL_STRIP | 11 refills | Status: AC

## 2024-05-05 NOTE — Assessment & Plan Note (Signed)
 Discussed physical activity limitations, weight fluctuations, and diet. Emphasized exercise and healthy lifestyle.Overdue for mammogram and eye exam. - Order mammogram at the breast center. - Schedule eye exam before the end of the year. - Encourage regular dental visits. - Perform physical examination. - Order A1c and CBC to monitor diabetes and anemia. - Discuss the importance of maintaining a healthy diet.

## 2024-05-05 NOTE — Assessment & Plan Note (Signed)
 Well-managed blood glucose on current insulin  regimen. Occasional hyperglycemia. No recent Farxiga  use due to cost. - Follow up with endocrinologist in 3 months with Dr. Wilhemina. - Order A1c to monitor diabetes control.

## 2024-05-05 NOTE — Assessment & Plan Note (Signed)
 Healing well with reduced tunneling. - Continue regular wound care with nurse visits. - Request wound measurements and progress reports from the surgical center.

## 2024-05-05 NOTE — Assessment & Plan Note (Signed)
Continue statin, tolerating medications well.

## 2024-05-05 NOTE — Assessment & Plan Note (Signed)
 Blood pressure is elevated, slightly improved with repeat. She is advised to take her medications as directed.

## 2024-05-05 NOTE — Assessment & Plan Note (Signed)
 Seen on CT scan while hospitalized, will refer to liver specialist at [facility].

## 2024-05-05 NOTE — Progress Notes (Signed)
 LILLETTE Kristeen JINNY Gladis, CMA,acting as a Neurosurgeon for Stephanie Ada, FNP.,have documented all relevant documentation on the behalf of Stephanie Ada, FNP,as directed by  Stephanie Ada, FNP while in the presence of Stephanie Ada, FNP.  Subjective:    Patient ID: [name] , female    DOB: Aug 08, 1980 , 43 y.o.   MRN: [id number]  Chief Complaint  Patient presents with   Annual Exam    Patient presents today for HM, Patient reports compliance with medication. Patient denies any chest pain, SOB, or headaches. Patient has no concerns today.     HPI Discussed the use of AI scribe software for clinical note transcription with the patient, who gave verbal consent to proceed.  History of Present Illness [name] is a 43 year old female with diabetes and cirrhosis of the liver who presents for an annual physical exam.  She is currently receiving weekly iron  infusions due to a low hemoglobin level of 7.8 g/dL. She has completed four infusions, with the last one scheduled for the upcoming Monday. Iron  infusions were chosen over a blood transfusion.  Her diabetes is well-controlled, with occasional blood sugar highs not exceeding 250 mg/dL. She uses a sliding scale for insulin  and has not needed more than two units since August. She maintains contact with her endocrinologist through virtual appointments and shares her blood sugar readings.  She experiences significant leg pain, limiting her physical activity. She attempts to walk to manage her weight, which has fluctuated, attributing some changes to the iron  infusions. She is not currently working and plans to return in November.  She has a wound that is monitored weekly, with her sister, a nurse, assisting with wound care twice daily.  She reports that she was told she has cirrhosis of the liver but denies alcohol consumption. Despite a family history of alcohol use, she states she has never been a drinker. She has a history of smoking but does  not currently smoke.  She reports gastrointestinal symptoms, including passing gas and abdominal pain, and has a gastroenterology appointment scheduled for later this month. No current alcohol use and a history of smoking.  She has an IUD in place, which was due for replacement, but it appears fine. She has not seen her gynecologist in two years.  She experiences shortness of breath upon exertion but no chest pain. She experiences constipation due to oxycodone  use and manages it with Miralax.  She has not had a mammogram this year but had one in 2023, which was normal. She has a history of a benign breast lump with a marker placed and has not been performing regular breast self-exams.   Continues to go to the wound clinic for her left labial abscess. She is going to [facility] her GYN care. She has a GI appt upcoming. She has had pain to her lower abdomen and has been passing gas.     Hypertension This is a chronic problem. The current episode started more than 1 year ago. The problem is resistant. Pertinent negatives include no chest pain or palpitations. There are no associated agents to hypertension. Risk factors for coronary artery disease include diabetes mellitus, dyslipidemia, family history, obesity, sedentary lifestyle and stress. Past treatments include diuretics and calcium  channel blockers. Compliance problems include diet, exercise and medication cost.   Diabetes She presents for her follow-up diabetic visit. She has type 2 diabetes mellitus. Her disease course has been fluctuating. Pertinent negatives for diabetes include no chest pain and  no polyphagia. Symptoms are worsening. Diabetic complications include heart disease. Risk factors for coronary artery disease include sedentary lifestyle, obesity, stress, family history and diabetes mellitus. Current diabetic treatment includes oral agent (dual therapy). She is following a generally unhealthy and diabetic diet. She has not  had a previous visit with a dietitian. She rarely participates in exercise.     Past Medical History:  Diagnosis Date   Abnormal Pap smear    f/u wnl   Asthma    DM2 (diabetes mellitus, type 2) (HCC)    DM2 (diabetes mellitus, type 2) (HCC) 09/13/2012   AIC 9.7 09-24-12 Referred to Dr. Jarold after consult with Dr. Darcel Appt. Scheduled  C. Armer, CNM, FNP    Fibroid    Gestational diabetes    Hypertension    Morbid obesity (HCC)    Sleep apnea    not on cpap, diagnosed when pregnant   Urinary tract infection      Family History  Problem Relation Age of Onset   Allergies Mother    Asthma Mother    Hypertension Mother    Diabetes Mother    Diabetes Father    Diabetes Maternal Grandmother    Heart disease Maternal Grandmother    Liver cancer Other    Liver cancer Other    Bladder Cancer Maternal Uncle    Anesthesia problems Neg Hx    Hypotension Neg Hx    Malignant hyperthermia Neg Hx    Pseudochol deficiency Neg Hx    Hearing loss Neg Hx      Current Outpatient Medications:    Accu-Chek Softclix Lancets lancets, USED TO TEST BLOOD SUGARS 3-4 TIMES DAILY *NEW PRESCRIPTION REQUEST*, Disp: 400 each, Rfl: 11   albuterol  (VENTOLIN  HFA) 108 (90 Base) MCG/ACT inhaler, Inhale 1-2 puffs into the lungs every 6 (six) hours as needed for wheezing or shortness of breath., Disp: 18 g, Rfl: 0   atorvastatin  (LIPITOR) 20 MG tablet, TAKE 1 TABLET BY MOUTH EVERY DAY *NEW PRESCRIPTION REQUEST*, Disp: 90 tablet, Rfl: 2   Blood Glucose Monitoring Suppl (BLOOD GLUCOSE MONITOR SYSTEM) w/Device KIT, Use 3 (three) times daily as directed, Disp: 1 kit, Rfl: 0   cetirizine  (ALLERGY RELIEF CETIRIZINE ) 10 MG tablet, TAKE 1 TABLET BY MOUTH EVERY DAY *NEW PRESCRIPTION REQUEST*, Disp: 90 tablet, Rfl: 2   ferrous sulfate  (FEROSUL) 325 (65 FE) MG tablet, TAKE ONE (1) TABLET BY MOUTH TWICE DAILY *NEW PRESCRIPTION REQUEST*, Disp: 180 tablet, Rfl: 1   fluticasone  (FLONASE ) 50 MCG/ACT nasal spray,  Place 1-2 sprays into both nostrils daily., Disp: 16 g, Rfl: 0   glucose blood (ACCU-CHEK GUIDE TEST) test strip, USED TO TEST BLOOD SUGARS up to  three IMES DAILY, Disp: 400 strip, Rfl: 11   glucose blood (ACCU-CHEK GUIDE) test strip, 1 each by Other route daily in the afternoon. Use as instructed, Disp: 100 each, Rfl: 3   Glucose Blood (BLOOD GLUCOSE TEST STRIPS) STRP, Use 3 (three) times daily as directed to check blood sugar., Disp: 100 strip, Rfl: 0   insulin  aspart (NOVOLOG  FLEXPEN) 100 UNIT/ML FlexPen, Inject 4 Units into the skin 3 (three) times daily with meals. If eating and Blood Glucose (BG) 80 or higher inject 0 units for meal coverage and add correction dose per scale. If not eating, correction dose only. BG <150= 0 unit; BG 150-200= 1 unit; BG 201-250= 2 unit; BG 251-300= 3 unit; BG 301-350= 4 unit; BG 351-400= 5 unit, Disp: 30 mL, Rfl: 3   Insulin  Glargine (  BASAGLAR  KWIKPEN) 100 UNIT/ML, Inject 36 Units into the skin daily., Disp: 45 mL, Rfl: 3   Insulin  Pen Needle 31G X 8 MM MISC, 1 Device by Does not apply route in the morning, at noon, in the evening, and at bedtime., Disp: 400 each, Rfl: 3   Lancet Device MISC, Use 3 (three) times daily. May dispense any manufacturer covered by patient's insurance., Disp: 1 each, Rfl: 0   levonorgestrel  (MIRENA ) 20 MCG/24HR IUD, 1 each by Intrauterine route once., Disp: , Rfl:    metFORMIN  (GLUCOPHAGE -XR) 750 MG 24 hr tablet, TAKE 2 TABLETS (1,500 MG TOTAL) BY MOUTH EVERY DAY WITH BREAKFAST *NEW PRESCRIPTION REQUEST*, Disp: 180 tablet, Rfl: 2   omeprazole (PRILOSEC) 20 MG capsule, Take 20 mg by mouth daily., Disp: , Rfl:    ondansetron  (ZOFRAN ) 8 MG tablet, TAKE 1 TABLET BY MOUTH EVERY 8 HOURS AS NEEDED FOR NAUSEA OR VOMITING. *NEW PRESCRIPTION REQUEST*, Disp: 90 tablet, Rfl: 0   oxyCODONE -acetaminophen  (PERCOCET/ROXICET) 5-325 MG tablet, Take 1 tablet by mouth every 6 (six) hours as needed for severe pain (pain score 7-10)., Disp: , Rfl:     sertraline  (ZOLOFT ) 50 MG tablet, TAKE 1 & 1/2 TABLETS BY MOUTH ONCE DAILY *NEW PRESCRIPTION REQUEST*, Disp: 135 tablet, Rfl: 0   triamcinolone  (KENALOG ) 0.025 % ointment, Apply 1 application topically 2 (two) times daily. (Patient taking differently: Apply 1 application  topically as needed.), Disp: 30 g, Rfl: 0   Continuous Glucose Sensor (DEXCOM G7 SENSOR) MISC, 1 Device by Does not apply route as directed. Every 10 days (Patient not taking: Reported on 05/05/2024), Disp: 9 each, Rfl: 3   Lancets (ACCU-CHEK MULTICLIX) lancets, USE TO CHECK BLOOD SUGARS UP TO 4 TIMES DAILY  DX CODE:E11.9 (Patient not taking: Reported on 05/05/2024), Disp: 100 each, Rfl: 12   methocarbamol  (ROBAXIN ) 500 MG tablet, TAKE 1 TABLET BY MOUTH 3 TIMES DAILY, Disp: 270 tablet, Rfl: 11   oxyCODONE -acetaminophen  (PERCOCET) 7.5-325 MG tablet, Take 2 tablets by mouth every 4 (four) hours as needed for moderate pain (pain score 4-6). (Patient not taking: Reported on 05/05/2024), Disp: 30 tablet, Rfl: 0   polyethylene glycol (MIRALAX / GLYCOLAX) 17 g packet, Take 17 g by mouth daily. (Patient not taking: Reported on 05/05/2024), Disp: , Rfl:    Allergies  Allergen Reactions   Farxiga  [Dapagliflozin ] Other (See Comments)    necrotizing fascititis   Fish Allergy Anaphylaxis   Other Anaphylaxis and Other (See Comments)    Pt has fish allergy Pt has allergy to butter cookies   Penicillins Rash and Other (See Comments)    Told MD that PCN reaction is rash and that she has taken amoxicillin  before. Tolerated Cefepime  X1 during 8/25 admission.    Bactrim  [Sulfamethoxazole -Trimethoprim ] Diarrhea and Rash    Abdominal cramps      The patient states she uses IUD for birth control. No LMP recorded (lmp unknown). (Menstrual status: IUD).   Negative for Dysmenorrhea and Negative for Menorrhagia. Negative for: breast discharge, breast lump(s), breast pain and breast self exam. Associated symptoms include abnormal vaginal bleeding. Pertinent  negatives include abnormal bleeding (hematology), anxiety, decreased libido, depression, difficulty falling sleep, dyspareunia, history of infertility, nocturia, sexual dysfunction, sleep disturbances, urinary incontinence, urinary urgency, vaginal discharge and vaginal itching. Diet regular; she feels it has been going well. May have had a couple high blood sugars in the middle of the day. The patient states her exercise level is minimal due to her recent hospitalization. She has been giving her  iron  infusions so far has had 5.   The patient's tobacco use is:  Social History   Tobacco Use  Smoking Status Former   Current packs/day: 0.00   Types: Cigarettes   Quit date: 08/08/2019   Years since quitting: 4.7  Smokeless Tobacco Former   She has been exposed to passive smoke. The patient's alcohol use is:  Social History   Substance and Sexual Activity  Alcohol Use No   Comment: occasional  Additional information: Last pap 06/2022, next one scheduled for 06/2025.    Review of Systems  Constitutional: Negative.   HENT: Negative.    Eyes: Negative.   Respiratory:  Negative for cough.   Cardiovascular: Negative.  Negative for chest pain, palpitations and leg swelling.  Gastrointestinal:  Negative for diarrhea and vomiting.  Endocrine: Negative.  Negative for polyphagia.  Genitourinary: Negative.   Musculoskeletal: Negative.   Skin: Negative.   Allergic/Immunologic: Negative.   Neurological: Negative.   Hematological: Negative.   Psychiatric/Behavioral: Negative.       Today's Vitals   05/05/24 1017  BP: (!) 140/80  Pulse: (!) 107  Temp: 99.5 F (37.5 C)  TempSrc: Oral  Weight: (!) 307 lb 12.8 oz (139.6 kg)  Height: 5' 5 (1.651 m)  PainSc: 0-No pain   Body mass index is 51.22 kg/m.  Wt Readings from Last 3 Encounters:  05/09/24 (!) 308 lb (139.7 kg)  05/05/24 (!) 307 lb 12.8 oz (139.6 kg)  05/02/24 (!) 307 lb 12.8 oz (139.6 kg)     Objective:  Physical Exam Vitals  and nursing note reviewed.  Constitutional:      General: She is not in acute distress.    Appearance: Normal appearance. She is well-developed. She is obese.  HENT:     Head: Normocephalic and atraumatic.     Right Ear: Hearing, tympanic membrane, ear canal and external ear normal. There is no impacted cerumen.     Left Ear: Hearing, tympanic membrane, ear canal and external ear normal. There is no impacted cerumen.     Nose: Nose normal.     Mouth/Throat:     Mouth: Mucous membranes are moist.  Eyes:     General: Lids are normal.     Extraocular Movements: Extraocular movements intact.     Conjunctiva/sclera: Conjunctivae normal.     Pupils: Pupils are equal, round, and reactive to light.     Funduscopic exam:    Right eye: No papilledema.        Left eye: No papilledema.  Neck:     Thyroid: No thyroid mass.     Vascular: No carotid bruit.  Cardiovascular:     Rate and Rhythm: Normal rate and regular rhythm.     Pulses: Normal pulses.     Heart sounds: Normal heart sounds. No murmur heard. Pulmonary:     Effort: Pulmonary effort is normal. No respiratory distress.     Breath sounds: Normal breath sounds. No wheezing.  Chest:     Chest wall: No mass.  Breasts:    Tanner Score is 5.     Right: Normal. No mass or tenderness.     Left: Normal. No mass or tenderness.  Abdominal:     General: Abdomen is flat. Bowel sounds are normal. There is no distension.     Palpations: Abdomen is soft.     Tenderness: There is no abdominal tenderness.  Genitourinary:    Comments: Deferred - followed by GYN, she is being followed by the  wound clinic for her left labial abscess wound.  Musculoskeletal:        General: No swelling or tenderness. Normal range of motion.     Cervical back: Full passive range of motion without pain, normal range of motion and neck supple.     Right lower leg: No edema.     Left lower leg: No edema.  Lymphadenopathy:     Upper Body:     Right upper body: No  supraclavicular, axillary or pectoral adenopathy.     Left upper body: No supraclavicular, axillary or pectoral adenopathy.  Skin:    General: Skin is warm and dry.     Capillary Refill: Capillary refill takes less than 2 seconds.  Neurological:     General: No focal deficit present.     Mental Status: She is alert and oriented to person, place, and time.     Cranial Nerves: No cranial nerve deficit.     Sensory: No sensory deficit.     Motor: No weakness.  Psychiatric:        Mood and Affect: Mood normal.        Behavior: Behavior normal.        Thought Content: Thought content normal.        Judgment: Judgment normal.      Assessment And Plan:     Encounter for annual health examination Assessment & Plan: Discussed physical activity limitations, weight fluctuations, and diet. Emphasized exercise and healthy lifestyle.Overdue for mammogram and eye exam. - Order mammogram at the breast center. - Schedule eye exam before the end of the year. - Encourage regular dental visits. - Perform physical examination. - Order A1c and CBC to monitor diabetes and anemia. - Discuss the importance of maintaining a healthy diet.   Essential hypertension Assessment & Plan: Blood pressure is elevated, slightly improved with repeat. She is advised to take her medications as directed.   Orders: -     POCT URINALYSIS DIP (CLINITEK) -     Microalbumin / creatinine urine ratio -     CMP14+EGFR  Mixed hyperlipidemia Assessment & Plan: Continue statin, tolerating medications well.    Orders: -     Hemoglobin A1c -     Lipid panel  Type 2 diabetes mellitus without complication, with long-term current use of insulin  (HCC) Assessment & Plan: Well-managed blood glucose on current insulin  regimen. Occasional hyperglycemia. No recent Farxiga  use due to cost. - Follow up with endocrinologist in 3 months with Dr. Wilhemina. - Order A1c to monitor diabetes control.   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 - Continue regular wound care with nurse visits. - Request wound measurements and progress reports from the surgical center.   Cirrhosis of liver without ascites, unspecified hepatic cirrhosis type [facility]) Assessment & Plan: Seen on CT scan while hospitalized, will refer to liver specialist at [facility].   Orders: -     Ambulatory referral to Gastroenterology      Return for 1 year physical, controlled DM check 4 months. Patient was given opportunity to  ask questions. Patient verbalized understanding of the plan and was able to repeat key elements of the plan. All questions were answered to their satisfaction.   Stephanie Ada, FNP  I, Stephanie Ada, FNP, have reviewed all documentation for this visit. The documentation on 05/05/24 for the exam, diagnosis, procedures, and orders are all accurate and complete.

## 2024-05-06 LAB — CMP14+EGFR
ALT: 34 IU/L — ABNORMAL HIGH (ref 0–32)
AST: 39 IU/L (ref 0–40)
Albumin: 3.2 g/dL — ABNORMAL LOW (ref 3.9–4.9)
Alkaline Phosphatase: 393 IU/L — ABNORMAL HIGH (ref 41–116)
BUN/Creatinine Ratio: 16 (ref 9–23)
BUN: 11 mg/dL (ref 6–24)
Bilirubin Total: 0.8 mg/dL (ref 0.0–1.2)
CO2: 21 mmol/L (ref 20–29)
Calcium: 9.9 mg/dL (ref 8.7–10.2)
Chloride: 98 mmol/L (ref 96–106)
Creatinine, Ser: 0.67 mg/dL (ref 0.57–1.00)
Globulin, Total: 4.7 g/dL — ABNORMAL HIGH (ref 1.5–4.5)
Glucose: 75 mg/dL (ref 70–99)
Potassium: 4.7 mmol/L (ref 3.5–5.2)
Sodium: 136 mmol/L (ref 134–144)
Total Protein: 7.9 g/dL (ref 6.0–8.5)
eGFR: 112 mL/min/1.73 (ref >59)

## 2024-05-06 LAB — HEPATITIS B SURFACE ANTIBODY,QUALITATIVE: Hep B Surface Ab, Qual: REACTIVE

## 2024-05-06 LAB — CBC WITH DIFFERENTIAL/PLATELET
Basophils Absolute: 0.1 x10E3/uL (ref 0.0–0.2)
Basos: 1 %
EOS (ABSOLUTE): 0.4 x10E3/uL (ref 0.0–0.4)
Eos: 4 %
Hematocrit: 38.7 % (ref 34.0–46.6)
Hemoglobin: 11.8 g/dL (ref 11.1–15.9)
Immature Grans (Abs): 0.1 x10E3/uL (ref 0.0–0.1)
Immature Granulocytes: 1 %
Lymphocytes Absolute: 1 x10E3/uL (ref 0.7–3.1)
Lymphs: 10 %
MCH: 25.1 pg — ABNORMAL LOW (ref 26.6–33.0)
MCHC: 30.5 g/dL — ABNORMAL LOW (ref 31.5–35.7)
MCV: 82 fL (ref 79–97)
Monocytes Absolute: 1.2 x10E3/uL — ABNORMAL HIGH (ref 0.1–0.9)
Monocytes: 12 %
Neutrophils Absolute: 7.1 x10E3/uL — ABNORMAL HIGH (ref 1.4–7.0)
Neutrophils: 72 %
Platelets: 612 x10E3/uL — ABNORMAL HIGH (ref 150–450)
RBC: 4.7 x10E6/uL (ref 3.77–5.28)
RDW: 17.2 % — ABNORMAL HIGH (ref 11.7–15.4)
WBC: 9.9 x10E3/uL (ref 3.4–10.8)

## 2024-05-06 LAB — HEMOGLOBIN A1C
Est. average glucose Bld gHb Est-mCnc: 134 mg/dL
Hgb A1c MFr Bld: 6.3 % — ABNORMAL HIGH (ref 4.8–5.6)

## 2024-05-06 LAB — LIPID PANEL
Chol/HDL Ratio: 3.6 ratio (ref 0.0–4.4)
Cholesterol, Total: 118 mg/dL (ref 100–199)
HDL: 33 mg/dL — ABNORMAL LOW (ref >39)
LDL Chol Calc (NIH): 69 mg/dL (ref 0–99)
Triglycerides: 80 mg/dL (ref 0–149)
VLDL Cholesterol Cal: 16 mg/dL (ref 5–40)

## 2024-05-09 ENCOUNTER — Inpatient Hospital Stay: Attending: Physician Assistant

## 2024-05-09 ENCOUNTER — Ambulatory Visit

## 2024-05-09 ENCOUNTER — Other Ambulatory Visit: Payer: Self-pay | Admitting: Hematology and Oncology

## 2024-05-09 VITALS — BP 158/99 | HR 108 | Temp 98.5°F | Resp 18 | Ht 65.0 in | Wt 308.0 lb

## 2024-05-09 DIAGNOSIS — D649 Anemia, unspecified: Secondary | ICD-10-CM | POA: Diagnosis not present

## 2024-05-09 DIAGNOSIS — D508 Other iron deficiency anemias: Secondary | ICD-10-CM

## 2024-05-09 MED ORDER — IRON SUCROSE 20 MG/ML IV SOLN
200.0000 mg | Freq: Once | INTRAVENOUS | Status: AC
  Administered 2024-05-09: 200 mg via INTRAVENOUS

## 2024-05-09 MED ORDER — SODIUM CHLORIDE 0.9 % IV SOLN: Freq: Once | INTRAVENOUS | Status: AC

## 2024-05-09 NOTE — Progress Notes (Signed)
 Diagnosis: Iron  Deficiency Anemia  Provider:  Praveen Mannam MD  Procedure: IV Push  IV Type: Peripheral, IV Location: L Antecubital  Venofer  (Iron  Sucrose), Dose: 200 mg  Post Infusion IV Care: Observation period completed and Peripheral IV Discontinued  Discharge: Condition: Good, Destination: Home . AVS Declined  Performed by:  Leita FORBES Miles, LPN

## 2024-05-12 ENCOUNTER — Other Ambulatory Visit: Payer: Self-pay | Admitting: Nurse Practitioner

## 2024-05-12 DIAGNOSIS — M726 Necrotizing fasciitis: Secondary | ICD-10-CM

## 2024-05-13 ENCOUNTER — Ambulatory Visit
Admission: RE | Admit: 2024-05-13 | Discharge: 2024-05-13 | Disposition: A | Discharge status: Home / Self Care | Source: Ambulatory Visit | Attending: Nurse Practitioner | Admitting: Nurse Practitioner

## 2024-05-13 ENCOUNTER — Ambulatory Visit: Payer: Self-pay | Admitting: Nurse Practitioner

## 2024-05-13 DIAGNOSIS — Z1231 Encounter for screening mammogram for malignant neoplasm of breast: Secondary | ICD-10-CM

## 2024-05-19 ENCOUNTER — Telehealth: Payer: Self-pay

## 2024-05-19 NOTE — Patient Instructions (Addendum)
 [name] - I am sorry we were unable to speak today for our scheduled appointment. Please note that our new telephone nurse follow up call is scheduled for Thursday, November 6 at 2:00 PM. I look forward to speaking with you and hope things are going well.   Thank you,   Clayborne Ly RN BSN CCM [place]  [facility], [facility] Nurse Care Coordinator  Direct Dial : [phone] Website: [email or web address]

## 2024-05-22 NOTE — Progress Notes (Signed)
 "     Stephanie Console, [name] [address] [place], [place]  [address] Phone: [phone]   Gastroenterology Consultation  Referring Provider:     Georgina Speaks, FNP Primary Care Physician:  Stephanie Speaks, FNP Primary Gastroenterologist:  Stephanie Console, [name] / Stephanie Holt, MD  Reason for Consultation:     Iron  deficiency anemia, cirrhosis        HPI:   [name] is a 43 y.o. y/o female referred for consultation & management  by Stephanie Speaks, FNP.    New patient.  Referred to evaluate iron  deficiency anemia and cirrhosis.  No previous GI evaluation.  She has appointment with hepatology nurse practitioner [name] 09/2024.  03/30/2024 abdominal liver CT with and without contrast: Cirrhotic liver morphology without suspicious liver observation. Previously seen liver lesion on MRI is not conspicuous on current CT. CT has less sensitivity for evaluation of liver lesions. Recommend future follow-ups with dedicated MRI liver protocol. Splenomegaly and stigmata of portal hypertension. Right kidney nonobstructive nephrolithiasis. Multiple retroperitoneal subcentimeter and pericentimeter lymph nodes.  03/15/2024 MRI liver with and without contrast: 1. Diffusely nodular contour of the liver compatible with cirrhosis. No arterial phase enhancing liver lesions identified to suggest HCC. Recommend correlation with alpha-fetoprotein levels. Given the limitations of the current exam due to motion artifact and patient body habitus, if there is a high clinical concern for underlying hepatoma recommend repeat imaging with dedicated liver protocol CT. 2. 1 cm subcapsular T2 hyperintense lesion in the periphery of segment 7, without postcontrast enhancement. Indeterminate. Consider follow up imaging in 3 to 6 months. 3. Mild splenomegaly with signs of diffuse iron  deposition.  [name] was hospitalized for large labial abscess 03/2024 and septic shock likely multifactorial in the  setting of sepsis due to large abscess with noted necrotizing fasciitis. CT abdomen and pelvis was obtained that showed cirrhosis with portal hypertension. In addition innumerable underlying masses suggestive of hepatocellular carcinoma. MRI of the liver was obtained on 03/15/2024 findings consistent with cirrhosis and no arterial phase enhancing liver lesions. There was 1 cm subcapsular T2 lesion int he peripheral of segment 7. The MRI was limited due to motion artifact and patient body habitus.    She still struggles with fatigue which impacts her ADLs.  Her appetite is slowly recovering as well.  She does have some nausea that improved in the hospital when she was given IV Zofran .  She does not have any antiemetics at home to take.  She denies any vomiting episodes.  She denies any abdominal pain.  She does have constipation with hard stools and straining.  She has a bowel movement every few days.  She has pain near her labial wound which is managed with Robaxin  and oxycodone  as needed.  She monthly menstrual cycles that are lasting 7 days and heavy.  She has no other signs of pain including hematochezia or melena.  Patient does have shortness of breath with exertion but none at rest.  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   Discussed the use of AI scribe software for clinical note transcription with the patient, who gave verbal consent to proceed.  History of Present Illness [name] is a 42 year old female with cirrhosis and iron  deficiency anemia who presents with abdominal pain.  She has an IUD and has had bilateral tubal ligation.  Menstrual cycles are irregular.  She has abdominal pain located generalized  over her upper and lower abdomen bilaterally.  Abdominal pain is described as a persistent ache in the lower abdomen, accompanied by bloating and gas. She has alternating episodes of constipation and diarrhea. No blood in stool or melena. She  takes omeprazole, although she does not experience heartburn or reflux.  She was told she has cirrhosis, which was found during a previous hospitalization for a severe infection and abscess, and she recalls having a CT scan at that time. Her liver function tests have been elevated. She has been diagnosed with iron  deficiency anemia and received an iron  infusion following her last blood work, which showed improved levels. She was previously taking oral iron  supplements, but ran out last week.   In terms of family history, her great aunt had liver disease, but there is no immediate family history of liver issues. 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 She has not engaged in social drinking or smoking due to her professional commitments.  PMH: Morbid obesity, IDA, hypertension, type 2 diabetes, OSA, asthma, cirrhosis.  Past Medical History:  Diagnosis Date   Abnormal Pap smear    f/u wnl   Asthma    DM2 (diabetes mellitus, type 2) (HCC)    DM2 (diabetes mellitus, type 2) (HCC) 09/13/2012   AIC 9.7 09-24-12 Referred to Dr. Jarold after consult with Dr. Darcel Appt. Scheduled  C. Armer, CNM, FNP    Fibroid    Gestational diabetes    Hypertension    Morbid obesity (HCC)    Sleep apnea    not on cpap, diagnosed when pregnant   Urinary tract infection     Past Surgical History:  Procedure Laterality Date   BREAST BIOPSY Right 07/24/2022   US  RT BREAST BX W LOC DEV 1ST LESION IMG BX SPEC US  GUIDE 07/24/2022 [facility] MAMMOGRAPHY   CESAREAN SECTION     DILATION AND CURETTAGE OF UTERUS     INCISION AND DRAINAGE OF WOUND N/A 03/13/2024   Procedure: IRRIGATION AND DEBRIDEMENT WOUND;  Surgeon: Stephanie Cordella LABOR, MD;  Location: [name];  Service: General;  Laterality: N/A;   INCISION AND DRAINAGE PERIRECTAL ABSCESS N/A 03/16/2024   Procedure: INCISION AND DRAINAGE, ABSCESS, PERIRECTAL;   Surgeon: Stephanie Deward PARAS, MD;  Location: [name];  Service: General;  Laterality: N/A;   IRRIGATION AND DEBRIDEMENT OF NECROTIZING SOFT TISSUE INFECTION Left 03/18/2024   Procedure: EXAM UNDER ANESTHESIA; INCISION, DRAINAGE, AND DEBRIDEMENT OF NECROTIZING SOFT TISSUE INFECTION LEFT LABIA AND THIGH;  Surgeon: Stechschulte, Deward PARAS, MD;  Location: [name];  Service: General;  Laterality: Left;   RECTAL EXAM UNDER ANESTHESIA N/A 03/13/2024   Procedure: EXAM UNDER ANESTHESIA,;  Surgeon: Stephanie Cordella LABOR, MD;  Location: [name];  Service: General;  Laterality: N/A;  I AND D left labia necrotizing infection   TUBAL LIGATION     WOUND EXPLORATION Left 03/19/2024   Procedure: EXPLORATION OF LEFT GROIN WOUND;  Surgeon: Dasie Leonor CROME, MD;  Location: [name];  Service: General;  Laterality: Left;  LEFT GROIN WOUND EXPLORATION    Prior to Admission medications   Medication Sig Start Date End Date Taking? Authorizing Provider  Accu-Chek Softclix Lancets lancets USED TO TEST BLOOD SUGARS 3-4 TIMES DAILY *NEW PRESCRIPTION REQUEST* 04/01/24   Stephanie Speaks, FNP  albuterol  (VENTOLIN  HFA) 108 (90 Base) MCG/ACT inhaler Inhale 1-2 puffs into the lungs every 6 (six) hours as needed  for wheezing or shortness of breath. 07/23/20   Wieters, Hallie C, [name]  atorvastatin  (LIPITOR) 20 MG tablet TAKE 1 TABLET BY MOUTH EVERY DAY *NEW PRESCRIPTION REQUEST* 04/01/24   Stephanie Speaks, FNP  Blood Glucose Monitoring Suppl (BLOOD GLUCOSE MONITOR SYSTEM) w/Device KIT Use 3 (three) times daily as directed 03/22/24   Samtani, Jai-Gurmukh, MD  cetirizine  (ALLERGY RELIEF CETIRIZINE ) 10 MG tablet TAKE 1 TABLET BY MOUTH EVERY DAY *NEW PRESCRIPTION REQUEST* 04/01/24   Stephanie Speaks, FNP  Continuous Glucose Sensor (DEXCOM G7 SENSOR) MISC 1 Device by Does not apply route as directed. Every 10 days Patient not taking: Reported on 05/05/2024 04/11/24   Shamleffer, Ibtehal Jaralla, MD  ferrous sulfate  (FEROSUL) 325 (65 FE) MG tablet TAKE ONE (1) TABLET BY  MOUTH TWICE DAILY *NEW PRESCRIPTION REQUEST* 04/01/24   Stephanie Speaks, FNP  fluticasone  (FLONASE ) 50 MCG/ACT nasal spray Place 1-2 sprays into both nostrils daily. 04/18/21   Ghumman, Ramandeep, NP  glucose blood (ACCU-CHEK GUIDE TEST) test strip USED TO TEST BLOOD SUGARS up to  three IMES DAILY 05/05/24   Shamleffer, Ibtehal Jaralla, MD  glucose blood (ACCU-CHEK GUIDE) test strip 1 each by Other route daily in the afternoon. Use as instructed 05/29/22   Shamleffer, Ibtehal Jaralla, MD  Glucose Blood (BLOOD GLUCOSE TEST STRIPS) STRP Use 3 (three) times daily as directed to check blood sugar. 03/22/24   Samtani, Jai-Gurmukh, MD  insulin  aspart (NOVOLOG  FLEXPEN) 100 UNIT/ML FlexPen Inject 4 Units into the skin 3 (three) times daily with meals. If eating and Blood Glucose (BG) 80 or higher inject 0 units for meal coverage and add correction dose per scale. If not eating, correction dose only. BG <150= 0 unit; BG 150-200= 1 unit; BG 201-250= 2 unit; BG 251-300= 3 unit; BG 301-350= 4 unit; BG 351-400= 5 unit 04/11/24   Shamleffer, Ibtehal Jaralla, MD  Insulin  Glargine (BASAGLAR  KWIKPEN) 100 UNIT/ML Inject 36 Units into the skin daily. 04/11/24   Shamleffer, Ibtehal Jaralla, MD  Insulin  Pen Needle 31G X 8 MM MISC 1 Device by Does not apply route in the morning, at noon, in the evening, and at bedtime. 04/11/24   Shamleffer, Donell Cardinal, MD  Lancet Device MISC Use 3 (three) times daily. May dispense any manufacturer covered by patient's insurance. 03/22/24   Samtani, Jai-Gurmukh, MD  Lancets (ACCU-CHEK MULTICLIX) lancets USE TO CHECK BLOOD SUGARS UP TO 4 TIMES DAILY  DX CODE:E11.9 Patient not taking: Reported on 05/05/2024 05/29/22   Shamleffer, Donell Cardinal, MD  levonorgestrel  (MIRENA ) 20 MCG/24HR IUD 1 each by Intrauterine route once.    [provider]  metFORMIN  (GLUCOPHAGE -XR) 750 MG 24 hr tablet TAKE 2 TABLETS (1,500 MG TOTAL) BY MOUTH EVERY DAY WITH BREAKFAST *NEW PRESCRIPTION REQUEST* 04/01/24    Stephanie Speaks, FNP  methocarbamol  (ROBAXIN ) 500 MG tablet TAKE 1 TABLET BY MOUTH 3 TIMES DAILY 05/13/24   Moore, Janece, FNP  omeprazole (PRILOSEC) 20 MG capsule Take 20 mg by mouth daily.    [provider]  ondansetron  (ZOFRAN ) 8 MG tablet TAKE 1 TABLET BY MOUTH EVERY 8 HOURS AS NEEDED FOR NAUSEA OR VOMITING. *NEW PRESCRIPTION REQUEST* 04/01/24   Stephanie Speaks, FNP  oxyCODONE -acetaminophen  (PERCOCET) 7.5-325 MG tablet Take 2 tablets by mouth every 4 (four) hours as needed for moderate pain (pain score 4-6). Patient not taking: Reported on 05/05/2024 03/30/24   Moore, Janece, FNP  oxyCODONE -acetaminophen  (PERCOCET/ROXICET) 5-325 MG tablet Take 1 tablet by mouth every 6 (six) hours as needed for severe pain (pain score 7-10).  [provider]  polyethylene glycol (MIRALAX / GLYCOLAX) 17 g packet Take 17 g by mouth daily. Patient not taking: Reported on 05/05/2024    [provider]  sertraline  (ZOLOFT ) 50 MG tablet TAKE 1 & 1/2 TABLETS BY MOUTH ONCE DAILY *NEW PRESCRIPTION REQUEST* 04/01/24   Stephanie Speaks, FNP  triamcinolone  (KENALOG ) 0.025 % ointment Apply 1 application topically 2 (two) times daily. Patient taking differently: Apply 1 application  topically as needed. 04/18/21   Pura Brittle, NP    Family History  Problem Relation Age of Onset   Allergies Mother    Asthma Mother    Hypertension Mother    Diabetes Mother    Diabetes Father    Bladder Cancer Maternal Uncle    Diabetes Maternal Grandmother    Heart disease Maternal Grandmother    Liver cancer Other    Liver cancer Other    Anesthesia problems Neg Hx    Hypotension Neg Hx    Malignant hyperthermia Neg Hx    Pseudochol deficiency Neg Hx    Hearing loss Neg Hx    Breast cancer Neg Hx      Social History   Tobacco Use   Smoking status: Former    Current packs/day: 0.00    Types: Cigarettes    Quit date: 08/08/2019    Years since quitting: 4.7   Smokeless tobacco: Former  Haematologist status: Never Used  Substance Use Topics   Alcohol use: No    Comment: occasional   Drug use: No    Allergies as of 05/23/2024 - Review Complete 05/23/2024  Allergen Reaction Noted   Farxiga  [dapagliflozin ] Other (See Comments) 03/28/2024   Fish allergy Anaphylaxis 07/07/2017   Other Anaphylaxis and Other (See Comments) 08/24/2011   Penicillins Rash and Other (See Comments) 12/10/2010   Bactrim  [sulfamethoxazole -trimethoprim ] Diarrhea and Rash 01/10/2020    Review of Systems:    All systems reviewed and negative except where noted in HPI.   Physical Exam:  BP 138/86   Pulse (!) 116   Ht 5' 5 (1.651 m)   Wt (!) 308 lb 4 oz (139.8 kg)   LMP  (LMP Unknown)   BMI 51.30 kg/m  No LMP recorded (lmp unknown). (Menstrual status: IUD).  General:   Alert,  Well-developed, well-nourished, pleasant and cooperative in NAD Lungs:  Respirations even and unlabored.  Clear throughout to auscultation.   No wheezes, crackles, or rhonchi. No acute distress. Heart:  Regular rate and rhythm; no murmurs, clicks, rubs, or gallops. Abdomen:  Normal bowel sounds.  No bruits.  Soft, and non-distended without masses, hepatosplenomegaly or hernias noted.  No Tenderness.  No guarding or rebound tenderness.    Neurologic:  Alert and oriented x3;  grossly normal neurologically. Psych:  Alert and cooperative. Normal mood and affect.   Imaging Studies: MM 3D SCREENING MAMMOGRAM BILATERAL BREAST Result Date: 05/17/2024 CLINICAL DATA:  Screening. EXAM: DIGITAL SCREENING BILATERAL MAMMOGRAM WITH TOMOSYNTHESIS AND CAD TECHNIQUE: Bilateral screening digital craniocaudal and mediolateral oblique mammograms were obtained. Bilateral screening digital breast tomosynthesis was performed. The images were evaluated with computer-aided detection. COMPARISON:  Previous exam(s). ACR Breast Density Category a: The breasts are almost entirely fatty. FINDINGS: There are no findings suspicious for malignancy. IMPRESSION:  No mammographic evidence of malignancy. A result letter of this screening mammogram will be mailed directly to the patient. RECOMMENDATION: Screening mammogram in one year. (Code:SM-B-01Y) BI-RADS CATEGORY  1: Negative. Electronically Signed   By: Curtistine Noble  On: 05/17/2024 12:52    Labs: CBC    Component Value Date/Time   WBC 9.9 05/05/2024 1102   WBC 14.6 (H) 03/25/2024 1516   WBC 13.4 (H) 03/22/2024 1034   RBC 4.70 05/05/2024 1102   RBC 2.94 (L) 03/25/2024 1516   HGB 11.8 05/05/2024 1102   HCT 38.7 05/05/2024 1102   PLT 612 (H) 05/05/2024 1102   MCV 82 05/05/2024 1102    CMP     Component Value Date/Time   NA 136 05/23/2024 1600   NA 136 05/05/2024 1102   K 4.4 05/23/2024 1600   CL 98 05/23/2024 1600   CO2 30 05/23/2024 1600   GLUCOSE 142 (H) 05/23/2024 1600   BUN 13 05/23/2024 1600   BUN 11 05/05/2024 1102   CREATININE 0.79 05/23/2024 1600   CREATININE 0.57 03/25/2024 1516   CALCIUM  9.9 05/23/2024 1600   CALCIUM  10.4 (H) 03/15/2024 1137   PROT 8.1 05/23/2024 1600   PROT 7.9 05/05/2024 1102   ALBUMIN  3.2 (L) 05/23/2024 1600   ALBUMIN  3.2 (L) 05/05/2024 1102   AST 49 (H) 05/23/2024 1600   AST 35 03/25/2024 1516   ALT 41 (H) 05/23/2024 1600   ALT 43 03/25/2024 1516   ALKPHOS 476 (H) 05/23/2024 1600   BILITOT 1.0 05/23/2024 1600   BILITOT 0.8 05/05/2024 1102   BILITOT 0.6 03/25/2024 1516   GFRNONAA >60 03/25/2024 1516   GFRAA 108 08/28/2020 1735   Component Ref Range & Units (hover) 2 wk ago (05/05/24) 1 mo ago (03/25/24) 2 mo ago (03/21/24) 2 mo ago (03/20/24) 2 mo ago (03/19/24)  Glucose 75 229 High  CM 223 High  CM 278 High  CM 300 High  CM  BUN 11 9 R 21 High  R 24 High  R 17 R  Creatinine, Ser 0.67 0.57 R 0.75 R 0.92 R 0.82 R  eGFR 112      BUN/Creatinine Ratio 16      Sodium 136 135 R 138 R 140 R 136 R  Potassium 4.7 5.0 R 4.8 R 5.0 R 5.0 R  Chloride 98 99 R 105 R 109 R 102 R  CO2 21 29 R 25 R 24 R 26 R  Calcium  9.9 9.3 R 8.8 Low  R 9.3 R 10.3 R   Total Protein 7.9 6.5 R 5.0 Low  R 4.9 Low  R 6.9 R  Albumin  3.2 Low  2.9 Low  R 1.7 Low  R 1.5 Low  R 2.0 Low  R  Globulin, Total 4.7 High       Bilirubin Total 0.8 0.6 0.5 0.8 0.7  Alkaline Phosphatase 393 High  273 High  R 185 High  R 225 High  R 351 High  R  AST 39 35 R 18 R 32 R 37 R  ALT 34 High  43 R 28 R 37 R 59 High  R   Component Ref Range & Units (hover) 2 wk ago (05/05/24) 1 mo ago (03/25/24) 2 mo ago (03/22/24) 2 mo ago (03/21/24) 2 mo ago (03/20/24) 2 mo ago (03/20/24) 2 mo ago (03/19/24)  WBC 9.9 14.6 High  R 13.4 High  R 16.9 High  R 23.9 High  R  16.2 High  R  RBC 4.70 2.94 Low  R 2.78 Low  R 2.86 Low  R 3.39 Low  R  2.95 Low  R  Hemoglobin 11.8 7.8 Low  R 7.4 Low  R 7.6 Low  R 8.8 Low  R 9.2  Low  R 7.7 Low  R  Hematocrit 38.7 25.0 Low  R 23.3 Low  R 23.4 Low  R 27.7 Low  R 27.0 Low  R 24.5 Low  R  MCV 82 85.0 R 83.8 R 81.8 R 81.7 R  83.1 R  MCH 25.1 Low  26.5 R 26.6 R 26.6 R 26.0 R  26.1 R  MCHC 30.5 Low  31.2 R 31.8 R 32.5 R 31.8 R  31.4 R  RDW 17.2 High  20.5 High  R 19.6 High  R 18.7 High  R 18.5 High  R  17.2 High  R  Platelets 612 High  614 High  R 374 R 333 R 304 R  427 High     Assessment and Plan:   [name] is a 83 y.o. y/o female has been referred for:  1.  Cirrhosis -CBC, CMP, PT/INR - She has appointment with Stephane Quest, NP, hepatologist February 2026. - Pending lab results, consider earlier referral to hepatology specialist  2.  Elevated LFTs - Labs: ANA, AMA, ASMA, ceruloplasmin, viral hepatitis A/B/C, iron  panel, ferritin, immunoglobulins, alpha-1 antitrypsin  - Labs: Alkaline phosphatase isoenzymes, GGT, intact PTH and calcium ,ANA, AMA, ASMA.   3.  Liver lesions - Labs AFP - She has been scheduled for repeat abdominal liver MRI with and without contrast 06/06/2024.  4.  Iron  deficiency anemia - Decide about scheduling EGD and colonoscopy in hospital after lab work returns and has been reviewed.  5.  Comorbidities: Morbid obesity  BMI greater than 50 - She will need EGD and colonoscopy in hospital pending lab results.    Follow up 6 weeks with Dr. Stacia.  Also follow-up based on lab results.  Stephanie Console, [name]   "

## 2024-05-23 ENCOUNTER — Encounter: Payer: Self-pay | Admitting: Physician Assistant

## 2024-05-23 ENCOUNTER — Ambulatory Visit (INDEPENDENT_AMBULATORY_CARE_PROVIDER_SITE_OTHER): Admitting: Physician Assistant

## 2024-05-23 ENCOUNTER — Other Ambulatory Visit

## 2024-05-23 VITALS — BP 138/86 | HR 116 | Ht 65.0 in | Wt 308.2 lb

## 2024-05-23 DIAGNOSIS — R7989 Other specified abnormal findings of blood chemistry: Secondary | ICD-10-CM | POA: Diagnosis not present

## 2024-05-23 DIAGNOSIS — K7460 Unspecified cirrhosis of liver: Secondary | ICD-10-CM | POA: Diagnosis not present

## 2024-05-23 DIAGNOSIS — K769 Liver disease, unspecified: Secondary | ICD-10-CM

## 2024-05-23 DIAGNOSIS — D649 Anemia, unspecified: Secondary | ICD-10-CM | POA: Diagnosis not present

## 2024-05-23 DIAGNOSIS — R748 Abnormal levels of other serum enzymes: Secondary | ICD-10-CM

## 2024-05-23 LAB — COMPREHENSIVE METABOLIC PANEL WITH GFR
ALT: 41 U/L — ABNORMAL HIGH (ref 0–35)
AST: 49 U/L — ABNORMAL HIGH (ref 0–37)
Albumin: 3.2 g/dL — ABNORMAL LOW (ref 3.5–5.2)
Alkaline Phosphatase: 476 U/L — ABNORMAL HIGH (ref 39–117)
BUN: 13 mg/dL (ref 6–23)
CO2: 30 meq/L (ref 19–32)
Calcium: 9.9 mg/dL (ref 8.4–10.5)
Chloride: 98 meq/L (ref 96–112)
Creatinine, Ser: 0.79 mg/dL (ref 0.40–1.20)
GFR: 92.27 mL/min (ref >60.00)
Glucose, Bld: 142 mg/dL — ABNORMAL HIGH (ref 70–99)
Potassium: 4.4 meq/L (ref 3.5–5.1)
Sodium: 136 meq/L (ref 135–145)
Total Bilirubin: 1 mg/dL (ref 0.2–1.2)
Total Protein: 8.1 g/dL (ref 6.0–8.3)

## 2024-05-23 LAB — PROTIME-INR
INR: 1.3 ratio — ABNORMAL HIGH (ref 0.8–1.0)
Prothrombin Time: 13.4 s — ABNORMAL HIGH (ref 9.6–13.1)

## 2024-05-23 LAB — VITAMIN D 25 HYDROXY (VIT D DEFICIENCY, FRACTURES): VITD: 15 ng/mL — ABNORMAL LOW (ref 30.00–100.00)

## 2024-05-23 LAB — GAMMA GT: GGT: 572 U/L — ABNORMAL HIGH (ref 7–51)

## 2024-05-23 NOTE — Patient Instructions (Signed)
 Your provider has requested that you go to the basement level for lab work before leaving today. Press B on the elevator. The lab is located at the first door on the left as you exit the elevator.  Please follow up sooner if symptoms increase or worsen  Due to recent changes in healthcare laws, you may see the results of your imaging and laboratory studies on MyChart before your provider has had a chance to review them.  We understand that in some cases there may be results that are confusing or concerning to you. Not all laboratory results come back in the same time frame and the provider may be waiting for multiple results in order to interpret others.  Please give us  48 hours in order for your provider to thoroughly review all the results before contacting the office for clarification of your results.   Thank you for trusting me with your gastrointestinal care!   [name], [name] _______________________________________________________  If your blood pressure at your visit was 140/90 or greater, please contact your primary care physician to follow up on this.  _______________________________________________________  If you are age 42 or older, your body mass index should be between 23-30. Your Body mass index is 51.3 kg/m. If this is out of the aforementioned range listed, please consider follow up with your Primary Care Provider.  If you are age 95 or younger, your body mass index should be between 19-25. Your Body mass index is 51.3 kg/m. If this is out of the aformentioned range listed, please consider follow up with your Primary Care Provider.   ________________________________________________________  The [facility] providers would like to encourage you to use MYCHART to communicate with providers for non-urgent requests or questions.  Due to long hold times on the telephone, sending your provider a message by [facility] may be a faster and more efficient way to get a response.  Please  allow 48 business hours for a response.  Please remember that this is for non-urgent requests.  _______________________________________________________

## 2024-05-24 NOTE — Progress Notes (Signed)
 Agree with the assessment and plan as outlined by [name], [name].  Patient with recent admission for necrotizing fasciitis, incidentally found to have cirrhotic appearing liver with evidence of portal hypertension (splenomegaly).  Patient has an elevated alkaline phosphatase, which was first noted about a year ago, but otherwise normal liver enzymes with normal AST to ALT ratio.  No thrombocytopenia, rather she has thrombocytosis, which may be related to her recent infection versus iron  deficiency.  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  I would recommend an elastography to assess liver stiffness as well as excluding other causes of chronic liver disease such as autoimmune, genetic and viral etiologies.  Her initial CT scan was initially concerning for hepatocellular carcinoma, but a subsequent MRI did not show any features of this, which is a much more accurate test than CT.  Additionally, her AFP level was not elevated.  Low suspicion for HCC. Agree with repeating MRI in 3 to 6 months to reevaluate the liver lesion identified on her MRI.  Okay to hold off on endoscopic evaluation for iron  deficiency anemia given her rapid improvement in her anemia and absence of overt GI bleeding.  We can readdress this at follow-up.

## 2024-05-25 ENCOUNTER — Other Ambulatory Visit: Payer: Self-pay

## 2024-05-25 DIAGNOSIS — K7460 Unspecified cirrhosis of liver: Secondary | ICD-10-CM

## 2024-05-25 NOTE — Progress Notes (Signed)
 Order in epic for [facility]q us  with elastography. Rad scheduling to contact pt to set his up. Pt also provided with their phone number to call if she has not heard from them in 2-3 days.

## 2024-05-27 ENCOUNTER — Telehealth: Payer: Self-pay

## 2024-05-27 LAB — ALKALINE PHOSPHATASE ISOENZYMES
Alkaline phosphatase (APISO): 548 U/L — ABNORMAL HIGH (ref 31–125)
Bone Isoenzymes: 25 % — ABNORMAL LOW (ref 28–66)
Intestinal Isoenzymes: 0 % — ABNORMAL LOW (ref 1–24)
Liver Isoenzymes: 75 % — ABNORMAL HIGH (ref 25–69)
Macrohepatic isoenzymes: 0 % (ref <=0)
Placental isoenzymes: 0 % (ref <=0)

## 2024-05-27 LAB — IRON,TIBC AND FERRITIN PANEL
%SAT: 17 % (ref 16–45)
Ferritin: 386 ng/mL — ABNORMAL HIGH (ref 16–232)
Iron: 60 ug/dL (ref 40–190)
TIBC: 348 ug/dL (ref 250–450)

## 2024-05-27 LAB — ANTI-NUCLEAR AB-TITER (ANA TITER): ANA Titer 1: 1:40 {titer} — ABNORMAL HIGH

## 2024-05-27 LAB — ALPHA-1-ANTITRYPSIN: A-1 Antitrypsin, Ser: 239 mg/dL — ABNORMAL HIGH (ref 83–199)

## 2024-05-27 LAB — PTH, INTACT AND CALCIUM
Calcium: 10 mg/dL (ref 8.6–10.2)
PTH: <6 pg/mL — ABNORMAL LOW (ref 16–77)

## 2024-05-27 LAB — HEPATITIS A ANTIBODY, TOTAL: Hepatitis A AB,Total: REACTIVE — AB

## 2024-05-27 LAB — HEPATITIS C ANTIBODY: Hepatitis C Ab: NONREACTIVE

## 2024-05-27 LAB — HEPATITIS B SURFACE ANTIBODY,QUALITATIVE: Hep B S Ab: REACTIVE — AB

## 2024-05-27 LAB — MITOCHONDRIAL ANTIBODIES: Mitochondrial M2 Ab, IgG: <=20 U (ref <=20.0)

## 2024-05-27 LAB — HEPATITIS B CORE ANTIBODY, TOTAL: Hep B Core Total Ab: NONREACTIVE

## 2024-05-27 LAB — ANTI-SMOOTH MUSCLE ANTIBODY, IGG: Actin (Smooth Muscle) Antibody (IGG): <20 U (ref <20)

## 2024-05-27 LAB — ANA: Anti Nuclear Antibody (ANA): POSITIVE — AB

## 2024-05-27 LAB — AFP TUMOR MARKER: AFP-Tumor Marker: 2.4 ng/mL

## 2024-05-27 LAB — HEPATITIS B CORE ANTIBODY, IGM: Hep B C IgM: NONREACTIVE

## 2024-05-27 LAB — HEPATITIS A ANTIBODY, IGM: Hep A IgM: NONREACTIVE

## 2024-05-27 LAB — CERULOPLASMIN: Ceruloplasmin: 44 mg/dL (ref 14–48)

## 2024-05-27 NOTE — Progress Notes (Signed)
 Complex Care Management Note  Care Guide Note 05/27/2024 Name: [name] MRN: [id number] DOB: [birth year]  [name] is a 43 y.o. year old female who sees Georgina Speaks, FNP for primary care. I reached out to Rayola Toppin Wallman by phone today to offer complex care management services.  [name] was given information about Complex Care Management services today including:   The Complex Care Management services include support from the care team which includes your Nurse Care Manager, Clinical Social Worker, or Pharmacist.  The Complex Care Management team is here to help remove barriers to the health concerns and goals most important to you. Complex Care Management services are voluntary, and the patient may decline or stop services at any time by request to their care team member.   Complex Care Management Consent Status: Patient agreed to services and verbal consent obtained.   Follow up plan:  Telephone appointment with complex care management team member scheduled for:  06/09/2024  Encounter Outcome:  Patient Scheduled  Debbe Fuse [facility], [facility] Guide  Direct Dial : [phone]  Fax [phone]

## 2024-05-29 ENCOUNTER — Ambulatory Visit: Payer: Self-pay | Admitting: Physician Assistant

## 2024-05-29 NOTE — Progress Notes (Signed)
 Pod A triage nurses: - I sent patient results in My Chart. -  I Forwarded labs to Dr. Stacia for review and FYI. - Please refer patient to endocrinologist for low PTH and positive ANA.  Evaluate for hypoparathyroidism. - Please have patient start OTC vitamin D  2000 IUs (50 mcg) daily for vitamin D  deficiency. - Keep follow-up appointment with Dr. Stacia as scheduled. [name], [name]

## 2024-06-06 ENCOUNTER — Ambulatory Visit (HOSPITAL_COMMUNITY)
Admission: RE | Admit: 2024-06-06 | Discharge: 2024-06-06 | Disposition: A | Discharge status: Home / Self Care | Source: Ambulatory Visit | Attending: Physician Assistant | Admitting: Physician Assistant

## 2024-06-06 ENCOUNTER — Ambulatory Visit: Payer: Self-pay | Admitting: Physician Assistant

## 2024-06-06 DIAGNOSIS — K7460 Unspecified cirrhosis of liver: Secondary | ICD-10-CM | POA: Insufficient documentation

## 2024-06-06 DIAGNOSIS — K769 Liver disease, unspecified: Secondary | ICD-10-CM | POA: Insufficient documentation

## 2024-06-06 MED ORDER — GADOBUTROL 1 MMOL/ML IV SOLN
10.0000 mL | Freq: Once | INTRAVENOUS | Status: AC | PRN
  Administered 2024-06-06: 10 mL via INTRAVENOUS

## 2024-06-09 ENCOUNTER — Other Ambulatory Visit: Payer: Self-pay

## 2024-06-09 NOTE — Patient Outreach (Signed)
 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 I obtained verbal consent from Patient.  Visit completed with Patient on the phone.   Background:   Past Medical History:  Diagnosis Date   Abnormal Pap smear    f/u wnl   Asthma    DM2 (diabetes mellitus, type 2) (HCC)    DM2 (diabetes mellitus, type 2) (HCC) 09/13/2012   AIC 9.7 09-24-12 Referred to Dr. Jarold after consult with Dr. Darcel Appt. Scheduled  C. Armer, CNM, FNP    Fibroid    Gestational diabetes    Hypertension    Morbid obesity (HCC)    Sleep apnea    not on cpap, diagnosed when pregnant   Urinary tract infection     Assessment: Patient Reported Symptoms:  Cognitive Cognitive Status: Alert and oriented to person, place, and time, Normal speech and language skills Cognitive/Intellectual Conditions Management [RPT]: None reported or documented in medical history or problem list   Health Maintenance Behaviors: Annual physical exam  Neurological Neurological Review of Symptoms: No symptoms reported    HEENT HEENT Symptoms Reported: No symptoms reported      Cardiovascular Cardiovascular Symptoms Reported: No symptoms reported    Respiratory Respiratory Symptoms Reported: Shortness of breath Other Respiratory Symptoms: shortness of breath on exertion    Endocrine Endocrine Symptoms Reported: Other, Weakness or fatigue Other symptoms related to hypoglycemia or hyperglycemia: Vitamin D  deficiency Is patient diabetic?: Yes Is patient checking blood sugars at home?: Yes List most recent blood  sugar readings, include date and time of day: FBS 105 Endocrine Self-Management Outcome: 4 (good) Endocrine Comment: abnormal Vitamin D  and PTH; message sent to Dr. Sam, established Endocrinologist for patient  Gastrointestinal Gastrointestinal Symptoms Reported: Unintentional weight gain, Abdominal pain or discomfort Additional Gastrointestinal Details: Obesity; new dx: fatty liver and Cirrhosis; intermittent sharp pain over RLQ Gastrointestinal Management Strategies: Diet modification (Routine Screening) Gastrointestinal Self-Management Outcome: 3 (uncertain) Gastrointestinal Comment: Reviewed upcoming scheduled f/u with GI    Genitourinary Genitourinary Symptoms Reported: No symptoms reported    Integumentary Integumentary Symptoms Reported: Wound Additional Integumentary Details: left labia/thigh, small open wound secondary to recent necrotizing fasciitis Skin Management Strategies: Routine screening, Dressing changes, Adequate rest Skin Self-Management Outcome: 4 (good) Skin Comment: patient is receiving wound care per [facility] reports wound is small, draining but requiring less amount of gauze and no packing  Musculoskeletal Musculoskelatal Symptoms Reviewed: Weakness Additional Musculoskeletal Details: patient is scheduled to establish with [facility] to work on improving stamina and endurance Musculoskeletal Management Strategies: Adequate rest, Routine screening Musculoskeletal Self-Management Outcome: 3 (uncertain) Falls in the past year?: No Number of falls in past year: 1 or less Was there an injury with Fall?: No Fall Risk Category Calculator: 0 Patient Fall Risk Level: Low Fall Risk Patient at Risk for Falls Due to: No Fall Risks Fall risk Follow up: Falls evaluation completed  Psychosocial Psychosocial Symptoms Reported: No symptoms reported   Major Change/Loss/Stressor/Fears (CP): Medical condition, self Techniques to Cope with  Loss/Stress/Change: Diversional activities (support system, 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) Quality of Family Relationships: helpful, involved, supportive Do you feel physically threatened by others?: No    06/09/2024    PHQ2-9 Depression Screening   Mivaan Corbitt interest or pleasure in doing things    Feeling down, depressed, or hopeless    PHQ-2 - Total Score    Trouble falling or staying asleep, or sleeping too much    Feeling tired or having Simeon Vera energy    Poor appetite or overeating     Feeling bad about yourself - or that you are a failure or have let yourself or your family down    Trouble concentrating on things, such as reading the newspaper or watching television    Moving or speaking so slowly that other people could have noticed.  Or the opposite - being so fidgety or restless that you have been moving around a lot more than usual    Thoughts that you would be better off dead, or hurting yourself in some way    PHQ2-9 Total Score    If you checked off any problems, how difficult have these problems made it for you to do your work, take care of things at home, or get along with other people    Depression Interventions/Treatment      There were no vitals filed for this visit.  Medications Reviewed Today     Reviewed by Morgan Clayborne CROME, RN (Registered Nurse) on 06/09/24 at 1436  Med List Status: <None>   Medication Order Taking? Sig Documenting Provider Last Dose Status Informant  Accu-Chek Softclix Lancets lancets [id number]  USED TO TEST BLOOD SUGARS 3-4 TIMES DAILY *NEW PRESCRIPTION REQUESTDEWAINE Ada, Gaines, FNP  Active   albuterol  (VENTOLIN  HFA) 108 (90 Base) MCG/ACT inhaler [id number]  Inhale 1-2 puffs into the lungs every 6 (six) hours as needed for wheezing or shortness of breath. Wieters, Hallie C, [name]  Active Self, Pharmacy Records  atorvastatin  (LIPITOR) 20 MG tablet [id number]  TAKE 1 TABLET BY MOUTH EVERY DAY  *NEW PRESCRIPTION REQUESTDEWAINE Ada, Gaines, FNP  Active   Blood Glucose Monitoring Suppl (BLOOD GLUCOSE MONITOR SYSTEM) w/Device KIT [id number]  Use 3 (three) times daily as directed Samtani, Jai-Gurmukh, MD  Active   cetirizine  (ALLERGY RELIEF CETIRIZINE ) 10 MG tablet [id number]  TAKE 1 TABLET BY MOUTH EVERY DAY *NEW PRESCRIPTION REQUESTDEWAINE [name], FNP  Active   Continuous Glucose Sensor (DEXCOM G7 SENSOR) MISC [id number]  1 Device by Does not apply route as directed. Every 10 days  Patient not taking: Reported on 05/23/2024   Shamleffer, Ibtehal Jaralla, MD  Active   ferrous sulfate  (FEROSUL) 325 (65 FE) MG tablet [id number]  TAKE ONE (1) TABLET BY MOUTH TWICE DAILY *NEW PRESCRIPTION REQUESTDEWAINE Ada, Gaines, FNP  Active   fluticasone  (FLONASE ) 50 MCG/ACT nasal spray [id number]  Place 1-2 sprays into both nostrils daily. Pura Brittle, NP  Active Self, Pharmacy Records  glucose blood (ACCU-CHEK GUIDE TEST) test strip [id number]  USED TO TEST BLOOD SUGARS up to  three IMES DAILY Shamleffer, Ibtehal Jaralla, MD  Active   glucose blood (ACCU-CHEK GUIDE) test strip [id number]  1 each by Other route daily in the afternoon. Use as instructed Shamleffer, Ibtehal Jaralla, MD  Active Self, Pharmacy Records  Glucose Blood (BLOOD GLUCOSE TEST STRIPS) STRP [id number]  Use 3 (three) times daily as directed to check blood sugar. Samtani, Jai-Gurmukh, MD  Active   insulin  aspart (NOVOLOG  FLEXPEN) 100 UNIT/ML FlexPen [id number]  Inject 4 Units into the skin 3 (three) times daily with meals.  If eating and Blood Glucose (BG) 80 or higher inject 0 units for meal coverage and add correction dose per scale. If not eating, correction dose only. BG <150= 0 unit; BG 150-200= 1 unit; BG 201-250= 2 unit; BG 251-300= 3 unit; BG 301-350= 4 unit; BG 351-400= 5 unit Shamleffer, Ibtehal Jaralla, MD  Active   Insulin  Glargine (BASAGLAR  KWIKPEN) 100 UNIT/ML [id number]  Inject 36 Units into the skin daily. Shamleffer, Donell Cardinal, MD   Active   Insulin  Pen Needle 31G X 8 MM MISC [id number]  1 Device by Does not apply route in the morning, at noon, in the evening, and at bedtime. Shamleffer, Donell Cardinal, MD  Active   Lancet Device MISC [id number]  Use 3 (three) times daily. May dispense any manufacturer covered by patient's insurance. Samtani, Jai-Gurmukh, MD  Active   Lancets (ACCU-CHEK MULTICLIX) lancets [id number]  USE TO CHECK BLOOD SUGARS UP TO 4 TIMES DAILY  DX CODE:E11.9  Patient not taking: Reported on 05/23/2024   Shamleffer, Donell Cardinal, MD  Active Self, Pharmacy Records  levonorgestrel  (MIRENA ) 20 MCG/24HR IUD [id number]  1 each by Intrauterine route once. [provider]  Active Self, Pharmacy Records           Med Note (CRUTHIS, CHLOE C   Mon Mar 14, 2024  8:21 AM) Still has this inserted.   metFORMIN  (GLUCOPHAGE -XR) 750 MG 24 hr tablet [id number]  TAKE 2 TABLETS (1,500 MG TOTAL) BY MOUTH EVERY DAY WITH BREAKFAST *NEW PRESCRIPTION REQUESTDEWAINE Ada, Gaines, FNP  Active   methocarbamol  (ROBAXIN ) 500 MG tablet [id number]  TAKE 1 TABLET BY MOUTH 3 TIMES DAILY Moore, Janece, FNP  Active   omeprazole (PRILOSEC) 20 MG capsule [id number]  Take 20 mg by mouth daily. [provider]  Active Self, Pharmacy Records  ondansetron  (ZOFRAN ) 8 MG tablet [id number]  TAKE 1 TABLET BY MOUTH EVERY 8 HOURS AS NEEDED FOR NAUSEA OR VOMITING. *NEW PRESCRIPTION REQUESTDEWAINE Ada, Gaines, FNP  Active   oxyCODONE -acetaminophen  (PERCOCET) 7.5-325 MG tablet [id number]  Take 2 tablets by mouth every 4 (four) hours as needed for moderate pain (pain score 4-6).  Patient not taking: Reported on 05/23/2024   [name], FNP  Active   oxyCODONE -acetaminophen  (PERCOCET/ROXICET) 5-325 MG tablet [id number]  Take 1 tablet by mouth every 6 (six) hours as needed for severe pain (pain score 7-10). [provider]  Active   polyethylene glycol (MIRALAX / GLYCOLAX) 17 g packet [id number]  Take 17 g by mouth daily.  Patient not taking:  Reported on 05/23/2024   [provider]  Active   sertraline  (ZOLOFT ) 50 MG tablet [id number]  TAKE 1 & 1/2 TABLETS BY MOUTH ONCE DAILY *NEW PRESCRIPTION REQUESTDEWAINE Ada, Gaines, FNP  Active   triamcinolone  (KENALOG ) 0.025 % ointment [id number]  Apply 1 application topically 2 (two) times daily.  Patient not taking: Reported on 05/23/2024   Ghumman, Ramandeep, NP  Active Self, Pharmacy Records            Recommendation:   Call your Endocrinologist, Dr. Sam to schedule a follow up visit  Keep your scheduled follow-up visits as scheduled  06/14/2024 Status: Sch   Time: 9:30 AM Length: 45  Visit Type: EVALUATION ORTHO [100] Copay: $0.00  Provider: Rosann Washington Greener, PT Department: [facility]   06/20/2024 Status: Sch   Time: 8:15 AM Length: 15  Visit Type: LAB [1477] Copay: $0.00  Provider: [facility] Department: [facility]   06/20/2024 Status: Sch   Time: 8:40 AM Length: 20  Visit Type: EST PT 20 [1965] Copay: $0.00  Provider: Federico Norleen ONEIDA MADISON, MD Department: [facility]    Follow Up Plan:   Telephone follow up appointment date/time:  Tuesday, December 2 at 2:15 PM  Clayborne Ly RN BSN CCM [place]  [facility], [facility] Nurse Care Coordinator  Direct Dial : [phone] Website: [email or web address]

## 2024-06-10 NOTE — Patient Instructions (Signed)
 Visit Information  Thank you for taking time to visit with me today. Please don't hesitate to contact me if I can be of assistance to you before our next scheduled appointment.  Our next appointment is by telephone on Tuesday, December 2 at 2:15 PM Please call the care guide team at [phone] if you need to cancel or reschedule your appointment.   Following is a copy of your care plan:   Goals Addressed             This Visit's Progress    VBCI RN Care Plan related to abnormal PTH with Vitamin D  deficiency       Problems:  Chronic Disease Management support and education needs related to Abnormal PTH with Vitamin D  deficiency   Goal: Over the next 90 days the Patient will continue to work with RN Care Manager and/or Social Worker to address care management and care coordination needs related to Abnormal PTH with Vitamin D  deficiency  as evidenced by adherence to care management team scheduled appointments      Interventions:   Evaluation of current treatment plan related to Abnormal PTH with Vitamin D  deficiency , self-management and patient's adherence to plan as established by provider Review of patient status, including review of consultant's reports, relevant laboratory and other test results, and medications completed Educated patient about the basic disease process related to Vitamin D  deficiency, including treatment and potential complications if left untreated Instructed patient to take her supplement exactly as directed, try to get 15 minutes of natural sunlight daily when possible, try to eat Vitamin D  rich foods  Instructed patient to talk to her Endocrinologist about the appropriate Vitamin D  dosage she should be taking  Instructed patient to schedule a follow-up with Dr. Sam for further evaluation of her abnormal PTH and Vitamin D  deficiency  Sent an in basket message to Dr. Sam advising of patient's recent abnormal PTH and vitamin D  deficiency  Discussed  plans with patient for ongoing care management follow up and provided patient with direct contact information for care management team  Patient Self-Care Activities:  Attend all scheduled provider appointments Call pharmacy for medication refills 3-7 days in advance of running out of medications Call provider office for new concerns or questions  Take medications as prescribed   Work with the nurse care manager to address care coordination needs and will continue to work with the clinical team to address health care and disease management related needs  Recommendation:   Call your Endocrinologist, Dr. Sam to schedule a follow up visit  Keep your scheduled follow-up visits as scheduled   06/14/2024 Status: Sch    Time: 9:30 AM Length: 45  Visit Type: EVALUATION ORTHO [100] Copay: $0.00  Provider: Rosann Washington Greener, PT Department: [facility]    06/20/2024 Status: Sch    Time: 8:15 AM Length: 15  Visit Type: LAB [1477] Copay: $0.00  Provider: [facility] Department: [facility]    06/20/2024 Status: Sch    Time: 8:40 AM Length: 20  Visit Type: EST PT 20 [1965] Copay: $0.00  Provider: Federico Norleen ONEIDA MADISON, MD Department: [facility]      Follow Up Plan:   Telephone follow up appointment date/time:  Tuesday, December 2 at 2:15 PM       VBCI RN Care Plan related to Impaired Skin Integrity       Problems:  Chronic Disease Management support and education needs related to Impaired Skin Integrity   Goal: Over the  next 90 days the Patient will continue to work with Medical Illustrator and/or Social Worker to address care management and care coordination needs related to Impaired Skin Integrity  as evidenced by adherence to care management team scheduled appointments      Interventions:   Evaluation of current treatment plan related to Impaired Skin Integrity , self-management and patient's adherence to plan as established by provider Reviewed and discussed  with patient her recent hospitalization for necrotizing fasciitis to her left labia/perineum  Reviewed and discussed with patient her plan of care per Maczis, Puja Gosai, PA  Continue to do twice daily wet-to-dry dressing changes. You can use an unraveled Kerlix gauze with normal saline to pack the wound. Cover with ABD and a small amount of tape. The surrounding skin can be washed with soapy water .  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  Educated patient re: when to call the doctor and or seek medication attention if symptoms worsen Reviewed and discussed patient's next upcoming follow-up for wound care  Discussed plans with patient for ongoing care management follow up and provided patient with direct contact information for care management team  Patient Self-Care Activities:  Attend all scheduled provider appointments Call pharmacy for medication refills 3-7 days in advance of running out of medications Call provider office for new concerns or questions  Take medications as prescribed   Work with the nurse care manager to address care coordination needs and will continue to work with the clinical team to address health care and disease management related needs  Recommendation:   Keep your scheduled follow-up visits as scheduled 06/13/2024 1:45 PM EST Office Visit [facility]  [address]  [place], [place] [address]  [phone]  Tari Tonja Barban, [place]   Call your Endocrinologist, Dr. Sam to schedule a follow up visit  Keep your scheduled follow-up visits as scheduled   06/14/2024 Status: Sch    Time: 9:30 AM Length: 45  Visit Type: EVALUATION ORTHO [100] Copay: $0.00  Provider: Rosann Washington Greener, PT Department: [facility]    06/20/2024 Status: Sch    Time: 8:15 AM Length: 15  Visit Type: LAB  [1477] Copay: $0.00  Provider: [facility] Department[facility]    06/20/2024 Status: Sch    Time: 8:40 AM Length: 20  Visit Type: EST PT 20 [1965] Copay: $0.00  Provider: Federico Norleen ONEIDA MADISON, MD Department: [facility]      Follow Up Plan:   Telephone follow up appointment date/time:  Tuesday, December 2 at 2:15 PM          Please call 1-800-273-TALK (toll free, 24 hour hotline) if you are experiencing a Mental Health or Behavioral Health Crisis or need someone to talk to.  Patient verbalized understanding of Care plan and visit instructions communicated this visit  Clayborne Ly RN BSN CCM [facility]  [facility], [facility] Nurse Care Coordinator  Direct Dial : [phone] Website: [email or web address]

## 2024-06-13 ENCOUNTER — Encounter: Payer: Self-pay | Admitting: Nurse Practitioner

## 2024-06-13 ENCOUNTER — Ambulatory Visit: Payer: Self-pay

## 2024-06-13 NOTE — Telephone Encounter (Signed)
 FYI Only or Action Required?: Action required by provider: request for appointment, clinical question for provider, and update on patient condition.  Patient was last seen in primary care on 05/05/2024 by Stephanie Speaks, FNP.  Called Nurse Triage reporting Shortness of Breath.  Symptoms began Worse lately.  Interventions attempted: Rest, hydration, or home remedies.  Symptoms are: gradually worsening.  Triage Disposition: See HCP Within 4 Hours (Or PCP Triage)  Patient/caregiver understands and will follow disposition?: No, wishes to speak with PCP              Copied from CRM #[id number]. Topic: Clinical - Red Word Triage >> Jun 13, 2024  8:58 AM [name] wrote: [name] that prompted transfer to Nurse Triage: Patient states he balance is really off, she can't falls and can't stand for lengths of time, Blood pressure really high, shortness of breath Reason for Disposition . [1] MILD difficulty breathing (e.g., minimal/no SOB at rest, SOB with walking, pulse < 100) AND [2] NEW-onset or WORSE than normal  Answer Assessment - Initial Assessment Questions Home Health Doctor-- Dr Anton Breed came in yesterday to patient's home: 167/97 and 138/89 103p and 135p Oxygen 95% ---tachycardia noted ---blood pressure was elevated ---pt gets light headed ---she states she still has an open wound ---gets short winded easily ---gets dizzy  G.I. doctor advised patient her vitamin D  was low and she has hypothyroidism---she was told the Vitamin D  dose was too low---started Vit D last week  Patient sees her wound specialist today at 1:30pm  Patient also has an appointment with Outpatient Rehabilitation  Patient states she was taken off of blood pressure medicine (Losartan ) while in the hospital due to low bp in the hospital in August  Patient also sent a MyChart message to her PCP  Visible swelling in left leg---pt states the wound is on the left leg as well  Patient states she  wants her PCP to advise further and she wants to get an office visit to see if she needs to get back on blood pressure medication and what else she needs to do Pt advised recommended is to be seen today but patient wants her PCP to advise further due to medical history and wants to get in to see her PCP at the earliest time available---no appointments open with her PCP today at this time or any other providers within the PCP office Patient is advised to call us  back if anything changes or with any further questions/concerns. Patient is advised that if anything worsens to go to the Emergency Room. Patient verbalized understanding.    1. RESPIRATORY STATUS: Describe your breathing? (e.g., wheezing, shortness of breath, unable to speak, severe coughing)      Shortness of breath, leg swelling 3. PATTERN Does the difficult breathing come and go, or has it been constant since it started?      Worse with exertion 4. SEVERITY: How bad is your breathing? (e.g., mild, moderate, severe)      Worse on exertion 6. CARDIAC HISTORY: Do you have any history of heart disease? (e.g., heart attack, angina, bypass surgery, angioplasty)      Pt denies 7. LUNG HISTORY: Do you have any history of lung disease?  (e.g., pulmonary embolus, asthma, emphysema)     asthma 11. PREGNANCY: Is there any chance you are pregnant? When was your last menstrual period?       No  Protocols used: Breathing Difficulty-A-AH

## 2024-06-14 ENCOUNTER — Ambulatory Visit (HOSPITAL_COMMUNITY)
Admission: RE | Admit: 2024-06-14 | Discharge: 2024-06-14 | Disposition: A | Discharge status: Home / Self Care | Source: Ambulatory Visit | Attending: Vascular Surgery | Admitting: Vascular Surgery

## 2024-06-14 ENCOUNTER — Other Ambulatory Visit (HOSPITAL_COMMUNITY): Payer: Self-pay | Admitting: Student

## 2024-06-14 ENCOUNTER — Encounter: Payer: Self-pay | Admitting: Nurse Practitioner

## 2024-06-14 ENCOUNTER — Other Ambulatory Visit: Payer: Self-pay

## 2024-06-14 ENCOUNTER — Ambulatory Visit: Attending: Student

## 2024-06-14 ENCOUNTER — Ambulatory Visit: Admitting: Nurse Practitioner

## 2024-06-14 ENCOUNTER — Ambulatory Visit

## 2024-06-14 VITALS — BP 130/92 | HR 110 | Temp 100.2°F | Ht 65.0 in | Wt 310.0 lb

## 2024-06-14 DIAGNOSIS — R000 Tachycardia, unspecified: Secondary | ICD-10-CM

## 2024-06-14 DIAGNOSIS — M6281 Muscle weakness (generalized): Secondary | ICD-10-CM | POA: Insufficient documentation

## 2024-06-14 DIAGNOSIS — E1169 Type 2 diabetes mellitus with other specified complication: Secondary | ICD-10-CM | POA: Diagnosis not present

## 2024-06-14 DIAGNOSIS — I10 Essential (primary) hypertension: Secondary | ICD-10-CM

## 2024-06-14 DIAGNOSIS — M7989 Other specified soft tissue disorders: Secondary | ICD-10-CM

## 2024-06-14 DIAGNOSIS — Z794 Long term (current) use of insulin: Secondary | ICD-10-CM

## 2024-06-14 DIAGNOSIS — E559 Vitamin D deficiency, unspecified: Secondary | ICD-10-CM

## 2024-06-14 DIAGNOSIS — R52 Pain, unspecified: Secondary | ICD-10-CM | POA: Insufficient documentation

## 2024-06-14 DIAGNOSIS — N7682 Fournier disease of vagina and vulva: Secondary | ICD-10-CM

## 2024-06-14 DIAGNOSIS — F418 Other specified anxiety disorders: Secondary | ICD-10-CM | POA: Diagnosis not present

## 2024-06-14 DIAGNOSIS — Z79899 Other long term (current) drug therapy: Secondary | ICD-10-CM

## 2024-06-14 DIAGNOSIS — R609 Edema, unspecified: Secondary | ICD-10-CM | POA: Diagnosis not present

## 2024-06-14 DIAGNOSIS — E209 Hypoparathyroidism, unspecified: Secondary | ICD-10-CM

## 2024-06-14 MED ORDER — LOSARTAN POTASSIUM 50 MG PO TABS: 50.0000 mg | ORAL_TABLET | Freq: Every day | ORAL | 1 refills | Status: DC

## 2024-06-14 MED ORDER — PROPRANOLOL HCL 10 MG PO TABS: 10.0000 mg | ORAL_TABLET | Freq: Every day | ORAL | 1 refills | Status: DC

## 2024-06-14 MED ORDER — VITAMIN D (ERGOCALCIFEROL) 1.25 MG (50000 UNIT) PO CAPS: 50000.0000 [IU] | ORAL_CAPSULE | ORAL | 1 refills | Status: AC
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
## 2024-06-14 NOTE — Assessment & Plan Note (Signed)
 Diabetes uncontrolled, affecting wound healing. A1c improved to 6.3. - Continue current diabetes management plan. - Encouraged adherence to diabetes management to improve A1c levels.

## 2024-06-14 NOTE — Progress Notes (Addendum)
 I,Jameka J Llittleton, CMA,acting as a neurosurgeon for [employer], FNP.,have documented all relevant documentation on the behalf of Stephanie Ada, FNP,as directed by  Stephanie Ada, FNP while in the presence of Stephanie Ada, FNP.  Subjective:  Patient ID: [name] , female    DOB: 1980/10/11 , 43 y.o.   MRN: [id number]  Chief Complaint  Patient presents with   Palpitations    Patient presents today for heart palpitations. She reports her symptoms started last week. The highest her heart has gotten is 135bpm. Patient reports she is having some SOB.     HPI  Discussed the use of AI scribe software for clinical note transcription with the patient, who gave verbal consent to proceed.  History of Present Illness [name] is a 43 year old female with tachycardia who presents with concerns about heart rate and leg swelling.  She has concerns about her heart rate and swelling in her leg, which is the same side as her wound. An ultrasound is scheduled today to check for blood clots. She has a history of tachycardia and was seen by a cardiologist in 2023, with a follow-up that was supposed to occur three months later, but she has not seen the cardiologist since then.  There is a wound on her leg that is almost closed, but there is noticeable swelling in the leg. A wound specialist expressed concern about the swelling and wanted to rule out a blood clot. The swelling is significant enough to be noticeable in the shower.  She has a vitamin D  deficiency, with a level of 15 ng/mL, and has been taking 2000 IU daily. The vitamin D  deficiency was identified by her GI specialist, [name], who conducted 21 different tests.  She has a new diagnosis of hypoparathyroidism (PTH <5) and is under the care of an endocrinologist, Dr. Wilhemina, whom she is scheduled to see next month for her diabetes  She is experiencing depression, which she attributes to her recent health challenges. She  acknowledges the need for therapy and is interested in starting sessions with an in-house therapist named Tawni.  She has a history of hypertension and was previously on losartan , which she stopped. She was also on propranolol , which she stopped while hospitalized, and is considering restarting it.  She reports feeling nauseous but states she is okay now.  Past Medical History:  Diagnosis Date   Abnormal Pap smear    f/u wnl   Asthma    DM2 (diabetes mellitus, type 2) (HCC)    DM2 (diabetes mellitus, type 2) (HCC) 09/13/2012   AIC 9.7 09-24-12 Referred to Dr. Jarold after consult with Dr. Darcel Appt. Scheduled  C. Armer, CNM, FNP    Fibroid    Gestational diabetes    Hypertension    Morbid obesity (HCC)    Sleep apnea    not on cpap, diagnosed when pregnant   Urinary tract infection      Family History  Problem Relation Age of Onset   Allergies Mother    Asthma Mother    Hypertension Mother    Diabetes Mother    Diabetes Father    Bladder Cancer Maternal Uncle    Diabetes Maternal Grandmother    Heart disease Maternal Grandmother    Liver cancer Other    Liver cancer Other    Anesthesia problems Neg Hx    Hypotension Neg Hx    Malignant hyperthermia Neg Hx    Pseudochol deficiency Neg Hx  Hearing loss Neg Hx    Breast cancer Neg Hx      Current Outpatient Medications:    Accu-Chek Softclix Lancets lancets, USED TO TEST BLOOD SUGARS 3-4 TIMES DAILY *NEW PRESCRIPTION REQUEST*, Disp: 400 each, Rfl: 11   albuterol  (VENTOLIN  HFA) 108 (90 Base) MCG/ACT inhaler, Inhale 1-2 puffs into the lungs every 6 (six) hours as needed for wheezing or shortness of breath., Disp: 18 g, Rfl: 0   atorvastatin  (LIPITOR) 20 MG tablet, TAKE 1 TABLET BY MOUTH EVERY DAY *NEW PRESCRIPTION REQUEST*, Disp: 90 tablet, Rfl: 2   Blood Glucose Monitoring Suppl (BLOOD GLUCOSE MONITOR SYSTEM) w/Device KIT, Use 3 (three) times daily as directed, Disp: 1 kit, Rfl: 0   cetirizine  (ALLERGY  RELIEF CETIRIZINE ) 10 MG tablet, TAKE 1 TABLET BY MOUTH EVERY DAY *NEW PRESCRIPTION REQUEST*, Disp: 90 tablet, Rfl: 2   fluticasone  (FLONASE ) 50 MCG/ACT nasal spray, Place 1-2 sprays into both nostrils daily., Disp: 16 g, Rfl: 0   glucose blood (ACCU-CHEK GUIDE TEST) test strip, USED TO TEST BLOOD SUGARS up to  three IMES DAILY, Disp: 400 strip, Rfl: 11   glucose blood (ACCU-CHEK GUIDE) test strip, 1 each by Other route daily in the afternoon. Use as instructed, Disp: 100 each, Rfl: 3   Glucose Blood (BLOOD GLUCOSE TEST STRIPS) STRP, Use 3 (three) times daily as directed to check blood sugar., Disp: 100 strip, Rfl: 0   insulin  aspart (NOVOLOG  FLEXPEN) 100 UNIT/ML FlexPen, Inject 4 Units into the skin 3 (three) times daily with meals. If eating and Blood Glucose (BG) 80 or higher inject 0 units for meal coverage and add correction dose per scale. If not eating, correction dose only. BG <150= 0 unit; BG 150-200= 1 unit; BG 201-250= 2 unit; BG 251-300= 3 unit; BG 301-350= 4 unit; BG 351-400= 5 unit, Disp: 30 mL, Rfl: 3   Insulin  Glargine (BASAGLAR  KWIKPEN) 100 UNIT/ML, Inject 36 Units into the skin daily., Disp: 45 mL, Rfl: 3   Insulin  Pen Needle 31G X 8 MM MISC, 1 Device by Does not apply route in the morning, at noon, in the evening, and at bedtime., Disp: 400 each, Rfl: 3   Lancet Device MISC, Use 3 (three) times daily. May dispense any manufacturer covered by patient's insurance., Disp: 1 each, Rfl: 0   levonorgestrel  (MIRENA ) 20 MCG/24HR IUD, 1 each by Intrauterine route once., Disp: , Rfl:    losartan  (COZAAR ) 50 MG tablet, Take 1 tablet (50 mg total) by mouth daily., Disp: 90 tablet, Rfl: 1   metFORMIN  (GLUCOPHAGE -XR) 750 MG 24 hr tablet, TAKE 2 TABLETS (1,500 MG TOTAL) BY MOUTH EVERY DAY WITH BREAKFAST *NEW PRESCRIPTION REQUEST*, Disp: 180 tablet, Rfl: 2   methocarbamol  (ROBAXIN ) 500 MG tablet, TAKE 1 TABLET BY MOUTH 3 TIMES DAILY, Disp: 270 tablet, Rfl: 11   omeprazole (PRILOSEC) 20 MG capsule,  Take 20 mg by mouth daily., Disp: , Rfl:    ondansetron  (ZOFRAN ) 8 MG tablet, TAKE 1 TABLET BY MOUTH EVERY 8 HOURS AS NEEDED FOR NAUSEA OR VOMITING. *NEW PRESCRIPTION REQUEST*, Disp: 90 tablet, Rfl: 0   propranolol  (INDERAL ) 10 MG tablet, Take 1 tablet (10 mg total) by mouth daily., Disp: 90 tablet, Rfl: 1   Vitamin D , Ergocalciferol , (DRISDOL ) 1.25 MG (50000 UNIT) CAPS capsule, Take 1 capsule (50,000 Units total) by mouth 2 (two) times a week., Disp: 24 capsule, Rfl: 1   Continuous Glucose Sensor (DEXCOM G7 SENSOR) MISC, 1 Device by Does not apply route as directed. Every 10 days (  Patient not taking: Reported on 06/14/2024), Disp: 9 each, Rfl: 3   ferrous sulfate  325 (65 FE) MG tablet, Take 1 tablet (325 mg total) by mouth daily with breakfast. Please take with a source of Vitamin C, Disp: 90 tablet, Rfl: 3   Lancets (ACCU-CHEK MULTICLIX) lancets, USE TO CHECK BLOOD SUGARS UP TO 4 TIMES DAILY  DX CODE:E11.9 (Patient not taking: Reported on 06/14/2024), Disp: 100 each, Rfl: 12   oxyCODONE -acetaminophen  (PERCOCET) 7.5-325 MG tablet, Take 2 tablets by mouth every 4 (four) hours as needed for moderate pain (pain score 4-6). (Patient not taking: Reported on 06/14/2024), Disp: 30 tablet, Rfl: 0   oxyCODONE -acetaminophen  (PERCOCET/ROXICET) 5-325 MG tablet, Take 1 tablet by mouth every 6 (six) hours as needed for severe pain (pain score 7-10). (Patient not taking: Reported on 06/14/2024), Disp: , Rfl:    polyethylene glycol (MIRALAX / GLYCOLAX) 17 g packet, Take 17 g by mouth daily. (Patient not taking: Reported on 06/14/2024), Disp: , Rfl:    sertraline  (ZOLOFT ) 50 MG tablet, TAKE 1 & 1/2 TABLETS BY MOUTH ONCE DAILY *NEW PRESCRIPTION REQUEST*, Disp: 135 tablet, Rfl: 0   triamcinolone  (KENALOG ) 0.025 % ointment, Apply 1 application topically 2 (two) times daily. (Patient not taking: Reported on 06/14/2024), Disp: 30 g, Rfl: 0   Allergies  Allergen Reactions   Farxiga  [Dapagliflozin ] Other (See Comments)     necrotizing fascititis   Fish Allergy Anaphylaxis   Other Anaphylaxis and Other (See Comments)    Pt has fish allergy Pt has allergy to butter cookies   Penicillins Rash and Other (See Comments)    Told MD that PCN reaction is rash and that she has taken amoxicillin  before. Tolerated Cefepime  X1 during 8/25 admission.    Bactrim  [Sulfamethoxazole -Trimethoprim ] Diarrhea and Rash    Abdominal cramps     Review of Systems  Constitutional:  Positive for fever.  Respiratory:  Positive for shortness of breath.   Cardiovascular:  Positive for palpitations.  Musculoskeletal: Negative.   Skin: Negative.   Neurological: Negative.   Psychiatric/Behavioral: Negative.       Today's Vitals   06/14/24 0827 06/14/24 0921  BP: (!) 130/90 (!) 130/92  Pulse: (!) 110   Temp: 100.2 F (37.9 C)   TempSrc: Oral   Weight: (!) 310 lb (140.6 kg)   Height: 5' 5 (1.651 m)   PainSc: 0-No pain    Body mass index is 51.59 kg/m.  Wt Readings from Last 3 Encounters:  06/20/24 (!) 301 lb 12.8 oz (136.9 kg)  06/14/24 (!) 310 lb (140.6 kg)  05/23/24 (!) 308 lb 4 oz (139.8 kg)      Objective:  Physical Exam Vitals and nursing note reviewed.  Constitutional:      General: She is not in acute distress.    Appearance: Normal appearance.  Cardiovascular:     Rate and Rhythm: Normal rate and regular rhythm.     Pulses: Normal pulses.     Heart sounds: Normal heart sounds. No murmur heard. Pulmonary:     Effort: Pulmonary effort is normal. No respiratory distress.     Breath sounds: Normal breath sounds. No wheezing.  Musculoskeletal:        General: No swelling or tenderness. Normal range of motion.  Skin:    General: Skin is warm and dry.  Neurological:     General: No focal deficit present.     Mental Status: She is alert and oriented to person, place, and time.     Cranial Nerves:  No cranial nerve deficit.     Motor: No weakness.  Psychiatric:        Mood and Affect: Mood normal.         Behavior: Behavior normal.        Thought Content: Thought content normal.        Judgment: Judgment normal.       Assessment And Plan:   Assessment & Plan Tachycardia Heart rate elevated at 110 bpm. Plan to restart propranolol  after losartan . - Ordered Zio patch for heart rate monitoring. - Restart propranolol  after 3-4 days of losartan , taking it separately in the morning and evening. Type 2 diabetes mellitus without complication, with long-term current use of insulin  (HCC) Diabetes uncontrolled, affecting wound healing. A1c improved to 6.3. - Continue current diabetes management plan. - Encouraged adherence to diabetes management to improve A1c levels. Depression with anxiety Depression noted, likely exacerbated by health issues. Referral to therapist available. - Scheduled appointment with in-house therapist Tawni for initial evaluation. - Will consider referral to external therapy if recommended by therapist. Vitamin D  deficiency Vitamin D  level low at 15. High-dose supplementation needed. - Prescribed vitamin D  50,000 IU, one capsule twice a week. 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 Healing prolonged due to uncontrolled diabetes. Other long term (current) drug therapy  Essential hypertension Blood pressure elevated. Plan to restart losartan  without diuretic. - Restarted losartan  for 90 days. - Scheduled a nurse visit in two weeks for blood pressure check after starting losartan . Swelling of lower leg Swelling in the left leg with concern for DVT. Ultrasound scheduled. - Proceed with scheduled ultrasound of the leg to evaluate for DVT. Hypoparathyroidism, unspecified hypoparathyroidism type Low PTH levels. Endocrinologist follow-up scheduled. - Checked thyroid function tests. - Continue follow up with endocrinologist as scheduled.  Orders Placed This Encounter  Procedures   Microalbumin / Creatinine Urine Ratio   TSH + free T4    Ambulatory referral to Integrated Behavioral Health   Ambulatory referral to Cardiology   LONG TERM MONITOR (3-14 DAYS)   EKG 12-Lead     Return if symptoms worsen or fail to improve, for schedule 2 week NV BP check and schedule with [facility]BH.  Patient was given opportunity to ask questions. Patient verbalized understanding of the plan and was able to repeat key elements of the plan. All questions were answered to their satisfaction.   Patient and/or legal guardian verbally consented to [facility] services about presenting concerns and psychiatric consultation as appropriate.  The services will be billed as appropriate for the patient   I, Stephanie Ada, FNP, have reviewed all documentation for this visit. The documentation on 06/14/24 for the exam, diagnosis, procedures, and orders are all accurate and complete.    IF YOU HAVE BEEN REFERRED TO A SPECIALIST, IT MAY TAKE 1-2 WEEKS TO SCHEDULE/PROCESS THE REFERRAL. IF YOU HAVE NOT HEARD FROM US /SPECIALIST IN TWO WEEKS, PLEASE GIVE US  A CALL AT [phone].

## 2024-06-14 NOTE — Progress Notes (Unsigned)
 Enrolled for [employer] to mail a ZIO XT long term holter monitor to the patients address on file.   EP to read

## 2024-06-15 ENCOUNTER — Ambulatory Visit: Payer: Self-pay | Admitting: Student

## 2024-06-20 ENCOUNTER — Inpatient Hospital Stay: Attending: Physician Assistant | Admitting: Hematology and Oncology

## 2024-06-20 ENCOUNTER — Inpatient Hospital Stay

## 2024-06-20 VITALS — BP 151/97 | HR 99 | Temp 97.1°F | Resp 18 | Wt 301.8 lb

## 2024-06-20 DIAGNOSIS — N920 Excessive and frequent menstruation with regular cycle: Secondary | ICD-10-CM | POA: Diagnosis present

## 2024-06-20 DIAGNOSIS — K769 Liver disease, unspecified: Secondary | ICD-10-CM | POA: Diagnosis not present

## 2024-06-20 DIAGNOSIS — Z8052 Family history of malignant neoplasm of bladder: Secondary | ICD-10-CM | POA: Diagnosis not present

## 2024-06-20 DIAGNOSIS — Z79899 Other long term (current) drug therapy: Secondary | ICD-10-CM | POA: Insufficient documentation

## 2024-06-20 DIAGNOSIS — Z808 Family history of malignant neoplasm of other organs or systems: Secondary | ICD-10-CM | POA: Diagnosis not present

## 2024-06-20 DIAGNOSIS — D500 Iron deficiency anemia secondary to blood loss (chronic): Secondary | ICD-10-CM | POA: Insufficient documentation

## 2024-06-20 DIAGNOSIS — D508 Other iron deficiency anemias: Secondary | ICD-10-CM | POA: Diagnosis not present

## 2024-06-20 DIAGNOSIS — K7469 Other cirrhosis of liver: Secondary | ICD-10-CM

## 2024-06-20 DIAGNOSIS — Z87891 Personal history of nicotine dependence: Secondary | ICD-10-CM | POA: Diagnosis not present

## 2024-06-20 DIAGNOSIS — E559 Vitamin D deficiency, unspecified: Secondary | ICD-10-CM | POA: Insufficient documentation

## 2024-06-20 DIAGNOSIS — M7989 Other specified soft tissue disorders: Secondary | ICD-10-CM | POA: Insufficient documentation

## 2024-06-20 DIAGNOSIS — E209 Hypoparathyroidism, unspecified: Secondary | ICD-10-CM | POA: Insufficient documentation

## 2024-06-20 LAB — RETIC PANEL
Immature Retic Fract: 27.5 % — ABNORMAL HIGH (ref 2.3–15.9)
RBC.: 5.3 MIL/uL — ABNORMAL HIGH (ref 3.87–5.11)
Retic Count, Absolute: 82.2 K/uL (ref 19.0–186.0)
Retic Ct Pct: 1.6 % (ref 0.4–3.1)
Reticulocyte Hemoglobin: 25.1 pg — ABNORMAL LOW (ref >27.9)

## 2024-06-20 LAB — CMP (CANCER CENTER ONLY)
ALT: 74 U/L — ABNORMAL HIGH (ref 0–44)
AST: 72 U/L — ABNORMAL HIGH (ref 15–41)
Albumin: 3.4 g/dL — ABNORMAL LOW (ref 3.5–5.0)
Alkaline Phosphatase: 531 U/L — ABNORMAL HIGH (ref 38–126)
Anion gap: 9 (ref 5–15)
BUN: 17 mg/dL (ref 6–20)
CO2: 29 mmol/L (ref 22–32)
Calcium: 11.1 mg/dL — ABNORMAL HIGH (ref 8.9–10.3)
Chloride: 99 mmol/L (ref 98–111)
Creatinine: 0.81 mg/dL (ref 0.44–1.00)
GFR, Estimated: >60 mL/min (ref >60)
Glucose, Bld: 155 mg/dL — ABNORMAL HIGH (ref 70–99)
Potassium: 4.1 mmol/L (ref 3.5–5.1)
Sodium: 137 mmol/L (ref 135–145)
Total Bilirubin: 1.3 mg/dL — ABNORMAL HIGH (ref 0.0–1.2)
Total Protein: 8.4 g/dL — ABNORMAL HIGH (ref 6.5–8.1)

## 2024-06-20 LAB — CBC WITH DIFFERENTIAL (CANCER CENTER ONLY)
Abs Immature Granulocytes: 0.05 K/uL (ref 0.00–0.07)
Basophils Absolute: 0.1 K/uL (ref 0.0–0.1)
Basophils Relative: 1 %
Eosinophils Absolute: 0.5 K/uL (ref 0.0–0.5)
Eosinophils Relative: 6 %
HCT: 42.9 % (ref 36.0–46.0)
Hemoglobin: 13.2 g/dL (ref 12.0–15.0)
Immature Granulocytes: 1 %
Lymphocytes Relative: 9 %
Lymphs Abs: 0.7 K/uL (ref 0.7–4.0)
MCH: 24.8 pg — ABNORMAL LOW (ref 26.0–34.0)
MCHC: 30.8 g/dL (ref 30.0–36.0)
MCV: 80.6 fL (ref 80.0–100.0)
Monocytes Absolute: 1 K/uL (ref 0.1–1.0)
Monocytes Relative: 13 %
Neutro Abs: 5.1 K/uL (ref 1.7–7.7)
Neutrophils Relative %: 70 %
Platelet Count: 520 K/uL — ABNORMAL HIGH (ref 150–400)
RBC: 5.32 MIL/uL — ABNORMAL HIGH (ref 3.87–5.11)
RDW: 19.9 % — ABNORMAL HIGH (ref 11.5–15.5)
WBC Count: 7.3 K/uL (ref 4.0–10.5)
nRBC: 0 % (ref 0.0–0.2)

## 2024-06-20 LAB — IRON AND IRON BINDING CAPACITY (CC-WL,HP ONLY)
Iron: 42 ug/dL (ref 28–170)
Saturation Ratios: 11 % (ref 10.4–31.8)
TIBC: 389 ug/dL (ref 250–450)
UIBC: 347 ug/dL (ref 148–442)

## 2024-06-20 LAB — VITAMIN B12: Vitamin B-12: 1903 pg/mL — ABNORMAL HIGH (ref 180–914)

## 2024-06-20 LAB — FOLATE: Folate: 10.4 ng/mL (ref >5.9)

## 2024-06-20 LAB — FERRITIN: Ferritin: 598 ng/mL — ABNORMAL HIGH (ref 11–307)

## 2024-06-20 MED ORDER — FERROUS SULFATE 325 (65 FE) MG PO TABS: 325.0000 mg | ORAL_TABLET | Freq: Every day | ORAL | 3 refills | Status: AC

## 2024-06-20 NOTE — Assessment & Plan Note (Signed)
 Vitamin D  level low at 15. High-dose supplementation needed. - Prescribed vitamin D  50,000 IU, one capsule twice a week.

## 2024-06-20 NOTE — Progress Notes (Signed)
 [facility] [phone]   [phone]  PROGRESS NOTE  Patient Care Team: Georgina Speaks, FNP as PCP - General (General Practice) Arnell Rockney PARAS, [facility] (Inactive) (Pharmacist) Shamleffer, Donell Cardinal, MD as Attending Physician (Endocrinology) Zina Jerilynn LABOR, MD as Consulting Physician (Obstetrics and Gynecology) Golden Forestine BROCKS, RN as Oncology Nurse Navigator (Medical Oncology) Morgan, Clayborne CROME, RN as [facility] Care Management  Hematological/Oncological History # Iron  Deficiency Anemia 2/2 to GYN Bleeding # Liver Lesions (Resolved)   Interval History:  [name] 43 y.o. female with medical history significant for iron  deficiency anemia secondary menstrual bleeding who presents for a follow up visit. The patient's last visit was on 03/25/2024. In the interim since the last visit she received 5 doses of IV Venofer  from September until October 2025.  On exam today [name] reports she is been well overall in the room since our last visit.  She received 5 iron  infusions in September and October 2025.  She tolerated those well without any difficulties.  She reports that her energy levels do still remain low and is about a 4 out of 10.  She has other issues going on including her vitamin D  deficiency and healing of her leg from the surgery.  She is not currently having any lightheadedness, dizziness, or shortness of breath.  She is currently wearing a heart monitor for her high rate.  She reports that she is having her usual menstrual bleeding which can be quite heavy at times.  She however has not had a cycle since August.  She reports she does her best to try to eat iron  rich foods including kale and she is drinking plenty water .  She is not currently taking iron  pills but is willing to do it.  She reports nothing else out of the ordinary.  She is not having any other bleeding, bruising, or dark stools.  A full 10 point ROS is otherwise  negative.  MEDICAL HISTORY:  Past Medical History:  Diagnosis Date   Abnormal Pap smear    f/u wnl   Asthma    DM2 (diabetes mellitus, type 2) (HCC)    DM2 (diabetes mellitus, type 2) (HCC) 09/13/2012   AIC 9.7 09-24-12 Referred to Dr. Jarold after consult with Dr. Darcel Appt. Scheduled  C. Armer, CNM, FNP    Fibroid    Gestational diabetes    Hypertension    Morbid obesity (HCC)    Sleep apnea    not on cpap, diagnosed when pregnant   Urinary tract infection     SURGICAL HISTORY: Past Surgical History:  Procedure Laterality Date   BREAST BIOPSY Right 07/24/2022   US  RT BREAST BX W LOC DEV 1ST LESION IMG BX SPEC US  GUIDE 07/24/2022 [facility] MAMMOGRAPHY   CESAREAN SECTION     DILATION AND CURETTAGE OF UTERUS     INCISION AND DRAINAGE OF WOUND N/A 03/13/2024   Procedure: IRRIGATION AND DEBRIDEMENT WOUND;  Surgeon: Polly Cordella LABOR, MD;  Location: [facility] OR;  Service: General;  Laterality: N/A;   INCISION AND DRAINAGE PERIRECTAL ABSCESS N/A 03/16/2024   Procedure: INCISION AND DRAINAGE, ABSCESS, PERIRECTAL;  Surgeon: Lyndel Deward PARAS, MD;  Location: [facility] OR;  Service: General;  Laterality: N/A;   IRRIGATION AND DEBRIDEMENT OF NECROTIZING SOFT TISSUE INFECTION Left 03/18/2024   Procedure: EXAM UNDER ANESTHESIA; INCISION, DRAINAGE, AND DEBRIDEMENT OF NECROTIZING SOFT TISSUE INFECTION LEFT LABIA AND THIGH;  Surgeon: Stechschulte, Deward PARAS, MD;  Location: [facility] OR;  Service: General;  Laterality:  Left;   RECTAL EXAM UNDER ANESTHESIA N/A 03/13/2024   Procedure: EXAM UNDER ANESTHESIA,;  Surgeon: Polly Cordella LABOR, MD;  Location: [facility] OR;  Service: General;  Laterality: N/A;  I AND D left labia necrotizing infection   TUBAL LIGATION     WOUND EXPLORATION Left 03/19/2024   Procedure: EXPLORATION OF LEFT GROIN WOUND;  Surgeon: Dasie Leonor CROME, MD;  Location: [facility] OR;  Service: General;  Laterality: Left;  LEFT GROIN WOUND EXPLORATION    SOCIAL HISTORY: Social History   Socioeconomic History    Marital status: Legally Separated    Spouse name: Married   Number of children: N   Years of education: Not on file   Highest education level: Not on file  Occupational History   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    Comment: BTL  Other Topics Concern   Not on file  Social History Narrative   Currently [redacted] weeks pregnant with first child (boy) Due 04/16/11         Social Drivers of Health   Financial Resource Strain: High Risk (04/11/2024)   Overall Financial Resource Strain (CARDIA)    Difficulty of Paying Living Expenses: Hard  Food Insecurity: Food Insecurity Present (06/09/2024)   Hunger Vital Sign    Worried About Running Out of Food in the Last Year: Sometimes true    Ran Out of Food in the Last Year: Sometimes true  Transportation Needs: No Transportation Needs (06/09/2024)   PRAPARE - Administrator, Civil Service (Medical): No    Lack of Transportation (Non-Medical): No  Physical Activity: Inactive (03/31/2019)   Exercise Vital Sign    Days of Exercise per Week: 0 days    Minutes of Exercise per Session: 0 min  Stress: Stress Concern Present (03/31/2019)   [employer] of Occupational Health - Occupational Stress Questionnaire    Feeling of Stress : To some extent  Social Connections: Not on file  Intimate Partner Violence: Not At Risk (06/09/2024)   Humiliation, Afraid, Rape, and Kick questionnaire    Fear of Current or Ex-Partner: No    Emotionally Abused: No    Physically Abused: No    Sexually Abused: No    FAMILY  HISTORY: Family History  Problem Relation Age of Onset   Allergies Mother    Asthma Mother    Hypertension Mother    Diabetes Mother    Diabetes Father    Bladder Cancer Maternal Uncle    Diabetes Maternal Grandmother    Heart disease Maternal Grandmother    Liver cancer Other    Liver cancer Other    Anesthesia problems Neg Hx    Hypotension Neg Hx    Malignant hyperthermia Neg Hx    Pseudochol deficiency Neg Hx    Hearing loss Neg Hx    Breast cancer Neg Hx     ALLERGIES:  is allergic to farxiga  [dapagliflozin ], fish allergy, other, penicillins, and bactrim  [sulfamethoxazole -trimethoprim ].  MEDICATIONS:  Current Outpatient  Medications  Medication Sig Dispense Refill   ferrous sulfate  325 (65 FE) MG tablet Take 1 tablet (325 mg total) by mouth daily with breakfast. Please take with a source of Vitamin C 90 tablet 3   Accu-Chek Softclix Lancets lancets USED TO TEST BLOOD SUGARS 3-4 TIMES DAILY *NEW PRESCRIPTION REQUEST* 400 each 11   albuterol  (VENTOLIN  HFA) 108 (90 Base) MCG/ACT inhaler Inhale 1-2 puffs into the lungs every 6 (six) hours as needed for wheezing or shortness of breath. 18 g 0   atorvastatin  (LIPITOR) 20 MG tablet TAKE 1 TABLET BY MOUTH EVERY DAY *NEW PRESCRIPTION REQUEST* 90 tablet 2   Blood Glucose Monitoring Suppl (BLOOD GLUCOSE MONITOR SYSTEM) w/Device KIT Use 3 (three) times daily as directed 1 kit 0   cetirizine  (ALLERGY RELIEF CETIRIZINE ) 10 MG tablet TAKE 1 TABLET BY MOUTH EVERY DAY *NEW PRESCRIPTION REQUEST* 90 tablet 2   Continuous Glucose Sensor (DEXCOM G7 SENSOR) MISC 1 Device by Does not apply route as directed. Every 10 days (Patient not taking: Reported on 06/14/2024) 9 each 3   fluticasone  (FLONASE ) 50 MCG/ACT nasal spray Place 1-2 sprays into both nostrils daily. 16 g 0   glucose blood (ACCU-CHEK GUIDE TEST) test strip USED TO TEST BLOOD SUGARS up to  three IMES DAILY 400 strip 11   glucose blood (ACCU-CHEK GUIDE) test strip 1 each by Other route  daily in the afternoon. Use as instructed 100 each 3   Glucose Blood (BLOOD GLUCOSE TEST STRIPS) STRP Use 3 (three) times daily as directed to check blood sugar. 100 strip 0   insulin  aspart (NOVOLOG  FLEXPEN) 100 UNIT/ML FlexPen Inject 4 Units into the skin 3 (three) times daily with meals. If eating and Blood Glucose (BG) 80 or higher inject 0 units for meal coverage and add correction dose per scale. If not eating, correction dose only. BG <150= 0 unit; BG 150-200= 1 unit; BG 201-250= 2 unit; BG 251-300= 3 unit; BG 301-350= 4 unit; BG 351-400= 5 unit 30 mL 3   Insulin  Glargine (BASAGLAR  KWIKPEN) 100 UNIT/ML Inject 36 Units into the skin daily. 45 mL 3   Insulin  Pen Needle 31G X 8 MM MISC 1 Device by Does not apply route in the morning, at noon, in the evening, and at bedtime. 400 each 3   Lancet Device MISC Use 3 (three) times daily. May dispense any manufacturer covered by patient's insurance. 1 each 0   Lancets (ACCU-CHEK MULTICLIX) lancets USE TO CHECK BLOOD SUGARS UP TO 4 TIMES DAILY  DX CODE:E11.9 (Patient not taking: Reported on 06/14/2024) 100 each 12   levonorgestrel  (MIRENA ) 20 MCG/24HR IUD 1 each by Intrauterine route once.     losartan  (COZAAR ) 50 MG tablet Take 1 tablet (50 mg total) by mouth daily. 90 tablet 1   metFORMIN  (GLUCOPHAGE -XR) 750 MG 24 hr tablet TAKE 2 TABLETS (1,500 MG TOTAL) BY MOUTH EVERY DAY WITH BREAKFAST *NEW PRESCRIPTION REQUEST* 180 tablet 2   methocarbamol  (ROBAXIN ) 500 MG tablet TAKE 1 TABLET BY MOUTH 3 TIMES DAILY 270 tablet 11   omeprazole (PRILOSEC) 20 MG capsule Take 20 mg by mouth daily.     ondansetron  (ZOFRAN ) 8 MG tablet TAKE 1 TABLET BY MOUTH EVERY 8 HOURS AS NEEDED FOR NAUSEA OR VOMITING. *NEW PRESCRIPTION REQUEST* 90 tablet 0   oxyCODONE -acetaminophen  (PERCOCET) 7.5-325 MG tablet Take 2 tablets by mouth every 4 (four) hours as needed for moderate pain (pain score 4-6). (Patient not taking: Reported on 06/14/2024) 30 tablet 0   oxyCODONE -acetaminophen   (  PERCOCET/ROXICET) 5-325 MG tablet Take 1 tablet by mouth every 6 (six) hours as needed for severe pain (pain score 7-10). (Patient not taking: Reported on 06/14/2024)     polyethylene glycol (MIRALAX / GLYCOLAX) 17 g packet Take 17 g by mouth daily. (Patient not taking: Reported on 06/14/2024)     propranolol  (INDERAL ) 10 MG tablet Take 1 tablet (10 mg total) by mouth daily. 90 tablet 1   sertraline  (ZOLOFT ) 50 MG tablet TAKE 1 & 1/2 TABLETS BY MOUTH ONCE DAILY *NEW PRESCRIPTION REQUEST* 135 tablet 0   triamcinolone  (KENALOG ) 0.025 % ointment Apply 1 application topically 2 (two) times daily. (Patient not taking: Reported on 06/14/2024) 30 g 0   Vitamin D , Ergocalciferol , (DRISDOL ) 1.25 MG (50000 UNIT) CAPS capsule Take 1 capsule (50,000 Units total) by mouth 2 (two) times a week. 24 capsule 1   No current facility-administered medications for this visit.    REVIEW OF SYSTEMS:   Constitutional: ( - ) fevers, ( - )  chills , ( - ) night sweats Eyes: ( - ) blurriness of vision, ( - ) double vision, ( - ) watery eyes Ears, nose, mouth, throat, and face: ( - ) mucositis, ( - ) sore throat Respiratory: ( - ) cough, ( - ) dyspnea, ( - ) wheezes Cardiovascular: ( - ) palpitation, ( - ) chest discomfort, ( - ) lower extremity swelling Gastrointestinal:  ( - ) nausea, ( - ) heartburn, ( - ) change in bowel habits Skin: ( - ) abnormal skin rashes Lymphatics: ( - ) new lymphadenopathy, ( - ) easy bruising Neurological: ( - ) numbness, ( - ) tingling, ( - ) new weaknesses Behavioral/Psych: ( - ) mood change, ( - ) new changes  All other systems were reviewed with the patient and are negative.  PHYSICAL EXAMINATION:  Vitals:   06/20/24 0927  BP: (!) 151/97  Pulse: 99  Resp: 18  Temp: (!) 97.1 F (36.2 C)  SpO2: 98%   Filed Weights   06/20/24 0927  Weight: (!) 301 lb 12.8 oz (136.9 kg)    GENERAL: Well-appearing middle-age African-American female, alert, no distress and comfortable SKIN:  skin color, texture, turgor are normal, no rashes or significant lesions EYES: conjunctiva are pink and non-injected, sclera clear LUNGS: clear to auscultation and percussion with normal breathing effort HEART: regular rate & rhythm and no murmurs and no lower extremity edema Musculoskeletal: no cyanosis of digits and no clubbing  PSYCH: alert & oriented x 3, fluent speech NEURO: no focal motor/sensory deficits  LABORATORY DATA:  I have reviewed the data as listed    Latest Ref Rng & Units 06/20/2024    8:52 AM 05/05/2024   11:02 AM 03/25/2024    3:16 PM  CBC  WBC 4.0 - 10.5 K/uL 7.3  9.9  14.6   Hemoglobin 12.0 - 15.0 g/dL 86.7  88.1  7.8   Hematocrit 36.0 - 46.0 % 42.9  38.7  25.0   Platelets 150 - 400 K/uL 520  612  614        Latest Ref Rng & Units 06/20/2024    8:52 AM 05/23/2024    4:00 PM 05/05/2024   11:02 AM  CMP  Glucose 70 - 99 mg/dL 844  857  75   BUN 6 - 20 mg/dL 17  13  11    Creatinine 0.44 - 1.00 mg/dL 9.18  9.20  9.32   Sodium 135 - 145 mmol/L 137  136  136  Potassium 3.5 - 5.1 mmol/L 4.1  4.4  4.7   Chloride 98 - 111 mmol/L 99  98  98   CO2 22 - 32 mmol/L 29  30  21    Calcium  8.9 - 10.3 mg/dL 88.8  9.9    89.9  9.9   Total Protein 6.5 - 8.1 g/dL 8.4  8.1  7.9   Total Bilirubin 0.0 - 1.2 mg/dL 1.3  1.0  0.8   Alkaline Phos 38 - 126 U/L 531  476  393   AST 15 - 41 U/L 72  49  39   ALT 0 - 44 U/L 74  41  34     No results found for: MPROTEIN No results found for: KPAFRELGTCHN, LAMBDASER, KAPLAMBRATIO   RADIOGRAPHIC STUDIES: VAS US  LOWER EXTREMITY VENOUS (DVT) Result Date: 06/14/2024  Lower Venous DVT Study Patient Name:  Rylyn Toppin Sutphen  Date of Exam:   06/14/2024 Medical Rec #: [id number]            Accession #:    [id number] Date of Birth: May 04, 1981            Patient Gender: F Patient Age:   65 years Exam Location:  [address] Procedure:      VAS US  LOWER EXTREMITY VENOUS (DVT) Referring Phys: PUJA MACZIS  --------------------------------------------------------------------------------  Indications: Swelling.  Performing Technologist: Duwaine Hives RVS  Examination Guidelines: A complete evaluation includes B-mode imaging, spectral Doppler, color Doppler, and power Doppler as needed of all accessible portions of each vessel. Bilateral testing is considered an integral part of a complete examination. Limited examinations for reoccurring indications may be performed as noted. The reflux portion of the exam is performed with the patient in reverse Trendelenburg.  +-----+---------------+---------+-----------+----------+--------------+ RIGHTCompressibilityPhasicitySpontaneityPropertiesThrombus Aging +-----+---------------+---------+-----------+----------+--------------+ CFV  Full           Yes      Yes                                 +-----+---------------+---------+-----------+----------+--------------+   +---------+---------------+---------+-----------+----------+--------------+ LEFT     CompressibilityPhasicitySpontaneityPropertiesThrombus Aging +---------+---------------+---------+-----------+----------+--------------+ CFV      Full           Yes      Yes                                 +---------+---------------+---------+-----------+----------+--------------+ SFJ      Full                                                        +---------+---------------+---------+-----------+----------+--------------+ FV Prox  Full                                                        +---------+---------------+---------+-----------+----------+--------------+ FV Mid   Full                                                        +---------+---------------+---------+-----------+----------+--------------+  FV DistalFull                                                        +---------+---------------+---------+-----------+----------+--------------+ PFV      Full                                                         +---------+---------------+---------+-----------+----------+--------------+ POP      Full           Yes      Yes                                 +---------+---------------+---------+-----------+----------+--------------+ PTV      Full                                                        +---------+---------------+---------+-----------+----------+--------------+ PERO     Full                                                        +---------+---------------+---------+-----------+----------+--------------+     Summary: RIGHT: - No evidence of common femoral vein obstruction.   LEFT: - There is no evidence of deep vein thrombosis in the lower extremity.  - No cystic structure found in the popliteal fossa.  *See table(s) above for measurements and observations. Electronically signed by Lonni Gaskins MD on 06/14/2024 at 1:58:58 PM.    Final    MR Abdomen W Wo Contrast Result Date: 06/06/2024 CLINICAL DATA:  cirrhosis, monitor for liver lesion, abdominal lymphadenopathy. EXAM: MRI ABDOMEN WITHOUT AND WITH CONTRAST TECHNIQUE: Multiplanar multisequence MR imaging of the abdomen was performed both before and after the administration of intravenous contrast. CONTRAST:  10mL GADAVIST  GADOBUTROL  1 MMOL/ML IV SOLN COMPARISON:  MRI abdomen from 03/15/2024. FINDINGS: Lower chest: Unremarkable MR appearance to the lung bases. No pleural effusion. No pericardial effusion. Normal heart size. Hepatobiliary: The liver is markedly enlarged measuring up to 24 cm in length. There is diffuse heterogeneous signal and surface nodularity, compatible with cirrhosis. No focal liver lesion. No lesion exhibiting diffusion restriction or arterial hyperenhancement. No lesion exhibiting washout. There are extensive 1-3 mm sized T1 hyperintense nodules throughout the liver, favored to represent regenerative micro nodules. No intrahepatic or extrahepatic bile duct dilatation. No  choledocholithiasis. Unremarkable gallbladder. Pancreas: No mass, inflammatory changes or other parenchymal abnormality identified. No main pancreatic duct dilation. Spleen:  Within normal limits in size and appearance. No focal mass. Adrenals/Urinary Tract: Unremarkable adrenal glands. No hydroureteronephrosis. No suspicious renal mass. There is a 1.5 x 1.8 cm cortical cyst in the right kidney upper pole. Stomach/Bowel: Visualized portions within the abdomen are unremarkable. No disproportionate dilation of bowel loops. Unremarkable appendix. Vascular/Lymphatic: No pathologically enlarged lymph nodes identified. No abdominal aortic aneurysm demonstrated. No  ascites. Other: Evaluation of reproductive organs is limited which are only seen on the coronal images; however, note is made of well-circumscribed least 1, 1.6 x 1.9 cm lesion in the fundus of the uterus, favored to represent a leiomyoma. There also simple appearing T2 hyperintense structures in bilateral ovaries with largest in the right ovary measuring up to 4.4 x 4.7 cm, favored to represent ovarian cysts. Correlate clinically to determine the need for additional imaging with pelvic ultrasound. Musculoskeletal: No suspicious bone lesions identified. IMPRESSION: 1. Cirrhotic liver without focal lesion. No ascites. No splenomegaly. 2. Multiple other nonacute observations, as described above. Electronically Signed   By: Ree Molt M.D.   On: 06/06/2024 14:46   US  ABDOMEN RUQ W/ELASTOGRAPHY Result Date: 06/06/2024 CLINICAL DATA:  Cirrhosis EXAM: US  ABDOMEN LIMITED - RIGHT UPPER QUADRANT ULTRASOUND HEPATIC ELASTOGRAPHY TECHNIQUE: Sonography of the right upper quadrant was performed. In addition, ultrasound elastography evaluation of the liver was performed. A region of interest was placed within the right lobe of the liver. Following application of a compressive sonographic pulse, tissue compressibility was assessed. Multiple assessments were performed at  the selected site. Median tissue compressibility was determined. Previously, hepatic stiffness was assessed by shear wave velocity. Based on recently published Society of Radiologists in Ultrasound consensus article, reporting is now recommended to be performed in the SI units of pressure (kiloPascals) representing hepatic stiffness/elasticity. The obtained result is compared to the published reference standards. (cACLD = compensated Advanced Chronic Liver Disease) COMPARISON:  CT abdomen and pelvis dated 03/30/2024 FINDINGS: ULTRASOUND ABDOMEN LIMITED RIGHT UPPER QUADRANT Gallbladder: No gallstones or wall thickening visualized. No sonographic Murphy sign noted. Common bile duct: Diameter: 3 mm Liver: Nodular hepatic contour and mildly heterogeneous parenchymal echogenicity. No focal hepatic lesion. Portal vein is patent on color Doppler imaging with normal direction of blood flow towards the liver. ULTRASOUND HEPATIC ELASTOGRAPHY Device: Siemens Helix VTQ Patient position: Oblique Transducer DAX Number of measurements: 12 Hepatic segment:  8 Median kPa: 6.1 IQR: 2.6 IQR/Median kPa ratio: 0.43 Data quality:  Good Diagnostic category: < or = 9 kPa: in the absence of other known clinical signs, rules out cACLD The use of hepatic elastography is applicable to patients with viral hepatitis and non-alcoholic fatty liver disease. At this time, there is insufficient data for the referenced cut-off values and use in other causes of liver disease, including alcoholic liver disease. Patients, however, may be assessed by elastography and serve as their own reference standard/baseline. In patients with non-alcoholic liver disease, the values suggesting compensated advanced chronic liver disease (cACLD) may be lower, and patients may need additional testing with elasticity results of 7-9 kPa. Please note that abnormal hepatic elasticity and shear wave velocities may also be identified in clinical settings other than with  hepatic fibrosis, such as: acute hepatitis, elevated right heart and central venous pressures including use of beta blockers, veno-occlusive disease (Budd-Chiari), infiltrative processes such as mastocytosis/amyloidosis/infiltrative tumor/lymphoma, extrahepatic cholestasis, with hyperemia in the post-prandial state, and with liver transplantation. Correlation with patient history, laboratory data, and clinical condition recommended. Diagnostic Categories: < or =5 kPa: high probability of being normal < or =9 kPa: in the absence of other known clinical signs, rules out cACLD >9 kPa and ?13 kPa: suggestive of cACLD, but needs further testing >13 kPa: highly suggestive of cACLD > or =17 kPa: highly suggestive of cACLD with an increased probability of clinically significant portal hypertension IMPRESSION: ULTRASOUND RUQ: Nodular hepatic contour can be seen in the setting of cirrhosis. No focal  hepatic lesions. ULTRASOUND HEPATIC ELASTOGRAPHY: Median kPa:  6.1 Diagnostic category: < or = 9 kPa: in the absence of other known clinical signs, rules out cACLD Electronically Signed   By: Limin  Xu M.D.   On: 06/06/2024 09:13    ASSESSMENT & PLAN Kaleia Longhi 43 y.o. female with medical history significant for iron  deficiency anemia secondary menstrual bleeding who presents for a follow up visit.   # Iron  Deficiency Anemia 2/2 to GYN Bleeding -- Findings are consistent with iron  deficiency anemia secondary to patient's menorrhagia --Encouraged her to follow-up with OB/GYN for better control of her menstrual cycles --labs today show white blood cell 7.3, hemoglobin 13.2, MCV 80.6, platelets 520.  Iron  studies pending. --Continue ferrous sulfate  325 mg daily with a source of vitamin C --We will plan to proceed with IV iron  therapy in order to help bolster the patient's blood counts if her iron  levels are persistently low. --Plan for return in 3 months for labs in 6 months for clinic visit  #Liver Lesions --  repeat imaging showed no evidence of liver lesions  --repeat MRI on 06/06/2024 showed cirrhotic liver with no focal lesions.  -- Continue following with GI and liver team.  Patient reports she will be seeing [facility] in February 2026.  No orders of the defined types were placed in this encounter.   All questions were answered. The patient knows to call the clinic with any problems, questions or concerns.  A total of more than 30 minutes were spent on this encounter with face-to-face time and non-face-to-face time, including preparing to see the patient, ordering tests and/or medications, counseling the patient and coordination of care as outlined above.   Norleen IVAR Kidney, MD Department of Hematology/Oncology [facility] at [facility] Phone: [phone] Pager: [phone] Email: [email or web address]  06/20/2024 10:08 AM

## 2024-06-20 NOTE — Assessment & Plan Note (Signed)
 Blood pressure elevated. Plan to restart losartan  without diuretic. - Restarted losartan  for 90 days. - Scheduled a nurse visit in two weeks for blood pressure check after starting losartan .

## 2024-06-20 NOTE — Assessment & Plan Note (Signed)
 Low PTH levels. Endocrinologist follow-up scheduled. - Checked thyroid function tests. - Continue follow up with endocrinologist as scheduled.

## 2024-06-20 NOTE — Assessment & Plan Note (Signed)
 Heart rate elevated at 110 bpm. Plan to restart propranolol  after losartan . - Ordered Zio patch for heart rate monitoring. - Restart propranolol  after 3-4 days of losartan , taking it separately in the morning and evening.

## 2024-06-20 NOTE — Assessment & Plan Note (Signed)
 Swelling in the left leg with concern for DVT. Ultrasound scheduled. - Proceed with scheduled ultrasound of the leg to evaluate for DVT.

## 2024-06-20 NOTE — Assessment & Plan Note (Signed)
 Wound healing well despite leg swelling. Healing prolonged due to uncontrolled diabetes.

## 2024-06-20 NOTE — Assessment & Plan Note (Signed)
 Depression noted, likely exacerbated by health issues. Referral to therapist available. - Scheduled appointment with in-house therapist Tawni for initial evaluation. - Will consider referral to external therapy if recommended by therapist.

## 2024-06-21 ENCOUNTER — Ambulatory Visit

## 2024-06-21 ENCOUNTER — Encounter: Payer: Self-pay | Admitting: Medical Oncology

## 2024-06-21 DIAGNOSIS — M6281 Muscle weakness (generalized): Secondary | ICD-10-CM | POA: Diagnosis not present
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
## 2024-06-22 ENCOUNTER — Ambulatory Visit: Admitting: Internal Medicine

## 2024-06-22 NOTE — Progress Notes (Deleted)
 Name: [name]  MRN/ DOB: [id number], 06/27/1981   Age/ Sex: 43 y.o., female    PCP: Georgina Speaks, FNP   Reason for Endocrinology Evaluation: Type 2 Diabetes Mellitus     Date of Initial Endocrinology Visit: 11/30/2020    PATIENT IDENTIFIER: [name] is a 43 y.o. female with a past medical history of T2DM, OSA, Dyslipidemia and HTN. The patient presented for initial endocrinology clinic visit on 11/30/2020 for consultative assistance with her diabetes management.    HPI: [name] was    Diagnosed with gestational diabetes in 2012  And never resolved after delivery Hemoglobin A1c has ranged from 7.1% in 2021, peaking at 11.1 in 2022.  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  She was lost to follow-up for approximately 18 months before her return to our clinic again.   Switched Ozempic  to Mounjaro  08/2023  I have attempted to screen her for [place] but saliva cortisol was not submitted January, 2025  She was started on prandial insulin  during hospitalization for labial abscess in August, 2025   She was off Farxiga  and Mounjaro  by September, 2025 due to high co-pay   Patient will remain off SGLT2 inhibitor due to bilateral labial abscess which progressed to necrotizing fasciitis, complicated by hypovolemic shock secondary to blood loss in August, 2025  SUBJECTIVE:   During the last visit (04/11/2024): This was a virtual visit     Today (06/22/24): [name] is here for follow-up on diabetes management.  She checks her blood sugars 3 times daily.  Since her last visit here and during evaluation by GI she was noted to have a low vitamin D  and low PTH She was started on vitamin D3 OTC  Since her last visit here she was started on prandial insulin  Patient is recovering well from necrotizing  fasciitis, she is following up with the wound clinic, next follow-up is in 6 weeks She is on Zofran  due to nausea She has had constipation but this has been improving with using MiraLAX as needed  She has not been on Farxiga  nor Mounjaro  due to cost issues   HOME DIABETES REGIMEN: Metformin  750 mg XR, 2 tabs daily Basaglar  36 units daily  Novolog  4 units TIDQAC Correction factor: NovoLog  (BG -100/50 )   Statin: yes ACE-I/ARB: yes Prior Diabetic Education: yes      METER DOWNLOAD SUMMARY:       DIABETIC COMPLICATIONS: Microvascular complications:  Denies: CKD,  Neuropathy, retinopathy  Last eye exam: Completed 2021  Macrovascular complications:   Denies: CAD, PVD, CVA   PAST HISTORY: Past Medical History:  Past Medical History:  Diagnosis Date   Abnormal Pap smear    f/u wnl   Asthma    DM2 (diabetes mellitus, type 2) (HCC)    DM2 (diabetes mellitus, type 2) (HCC) 09/13/2012   AIC 9.7 09-24-12 Referred to Dr. Jarold after consult with Dr. Darcel Appt. Scheduled  C. Armer, CNM, FNP    Fibroid    Gestational diabetes    Hypertension    Morbid obesity (HCC)    Sleep apnea    not on cpap, diagnosed when pregnant   Urinary tract infection    Past Surgical History:  Past Surgical History:  Procedure Laterality Date   BREAST BIOPSY Right 07/24/2022   US  RT  BREAST BX W LOC DEV 1ST LESION IMG BX SPEC US  GUIDE 07/24/2022 [facility]MOGRAPHY   CESAREAN SECTION     DILATION AND CURETTAGE OF UTERUS     INCISION AND DRAINAGE OF WOUND N/A 03/13/2024   Procedure: IRRIGATION AND DEBRIDEMENT WOUND;  Surgeon: Polly Cordella LABOR, MD;  Location: [facility] OR;  Service: General;  Laterality: N/A;   INCISION AND DRAINAGE PERIRECTAL ABSCESS N/A 03/16/2024   Procedure: INCISION AND DRAINAGE, ABSCESS, PERIRECTAL;  Surgeon: Lyndel Deward PARAS, MD;  Location: [facility] OR;  Service: General;  Laterality: N/A;   IRRIGATION AND DEBRIDEMENT OF NECROTIZING SOFT TISSUE INFECTION Left  03/18/2024   Procedure: EXAM UNDER ANESTHESIA; INCISION, DRAINAGE, AND DEBRIDEMENT OF NECROTIZING SOFT TISSUE INFECTION LEFT LABIA AND THIGH;  Surgeon: Stechschulte, Deward PARAS, MD;  Location: [facility] OR;  Service: General;  Laterality: Left;   RECTAL EXAM UNDER ANESTHESIA N/A 03/13/2024   Procedure: EXAM UNDER ANESTHESIA,;  Surgeon: Polly Cordella LABOR, MD;  Location: [facility] OR;  Service: General;  Laterality: N/A;  I AND D left labia necrotizing infection   TUBAL LIGATION     WOUND EXPLORATION Left 03/19/2024   Procedure: EXPLORATION OF LEFT GROIN WOUND;  Surgeon: Dasie Leonor CROME, MD;  Location: [facility] OR;  Service: General;  Laterality: Left;  LEFT GROIN WOUND EXPLORATION    Social History:  reports that she quit smoking about 4 years ago. Her smoking use included cigarettes. She has quit using smokeless tobacco. She reports that she does not drink alcohol and does not use drugs. Family History:  Family History  Problem Relation Age of Onset   Allergies Mother    Asthma Mother    Hypertension Mother    Diabetes Mother    Diabetes Father    Bladder Cancer Maternal Uncle    Diabetes Maternal Grandmother    Heart disease Maternal Grandmother    Liver cancer Other    Liver cancer Other    Anesthesia problems Neg Hx    Hypotension Neg Hx    Malignant hyperthermia Neg Hx    Pseudochol deficiency Neg Hx    Hearing loss Neg Hx    Breast cancer Neg Hx      HOME MEDICATIONS: Allergies as of 06/22/2024       Reactions   Farxiga  [dapagliflozin ] Other (See Comments)   necrotizing fascititis   Fish Allergy Anaphylaxis   Other Anaphylaxis, Other (See Comments)   Pt has fish allergy Pt has allergy to butter cookies   Penicillins Rash, Other (See Comments)   Told MD that PCN reaction is rash and that she has taken amoxicillin  before. Tolerated Cefepime  X1 during 8/25 admission.    Bactrim  [sulfamethoxazole -trimethoprim ] Diarrhea, Rash   Abdominal cramps        Medication List        Accurate as of  June 22, 2024  7:35 AM. If you have any questions, ask your nurse or doctor.          Accu-Chek Guide test strip Generic drug: glucose blood 1 each by Other route daily in the afternoon. Use as instructed   Accu-Chek Guide Test test strip Generic drug: glucose blood Use 3 (three) times daily as directed to check blood sugar.   Accu-Chek Guide Test test strip Generic drug: glucose blood USED TO TEST BLOOD SUGARS up to  three IMES DAILY   Accu-Chek Guide w/Device Kit Use 3 (three) times daily as directed   accu-chek multiclix lancets USE TO CHECK BLOOD SUGARS UP TO 4 TIMES DAILY  DX CODE:E11.9   Accu-Chek Softclix Lancets lancets USED TO TEST BLOOD SUGARS 3-4 TIMES DAILY *NEW PRESCRIPTION REQUEST*   albuterol  108 (90 Base) MCG/ACT inhaler Commonly known as: VENTOLIN  HFA Inhale 1-2 puffs into the lungs every 6 (six) hours as needed for wheezing or shortness of breath.   Allergy Relief Cetirizine  10 MG tablet Generic drug: cetirizine  TAKE 1 TABLET BY MOUTH EVERY DAY *NEW PRESCRIPTION REQUEST*   atorvastatin  20 MG tablet Commonly known as: LIPITOR TAKE 1 TABLET BY MOUTH EVERY DAY *NEW PRESCRIPTION REQUEST*   Basaglar  KwikPen 100 UNIT/ML Inject 36 Units into the skin daily.   Dexcom G7 Sensor Misc 1 Device by Does not apply route as directed. Every 10 days   ferrous sulfate  325 (65 FE) MG tablet Take 1 tablet (325 mg total) by mouth daily with breakfast. Please take with a source of Vitamin C   fluticasone  50 MCG/ACT nasal spray Commonly known as: FLONASE  Place 1-2 sprays into both nostrils daily.   Insulin  Pen Needle 31G X 8 MM Misc 1 Device by Does not apply route in the morning, at noon, in the evening, and at bedtime.   Lancet Device Misc Use 3 (three) times daily. May dispense any manufacturer covered by patient's insurance.   levonorgestrel  20 MCG/24HR IUD Commonly known as: MIRENA  1 each by Intrauterine route once.   losartan  50 MG tablet Commonly  known as: Cozaar  Take 1 tablet (50 mg total) by mouth daily.   metFORMIN  750 MG 24 hr tablet Commonly known as: GLUCOPHAGE -XR TAKE 2 TABLETS (1,500 MG TOTAL) BY MOUTH EVERY DAY WITH BREAKFAST *NEW PRESCRIPTION REQUEST*   methocarbamol  500 MG tablet Commonly known as: ROBAXIN  TAKE 1 TABLET BY MOUTH 3 TIMES DAILY   NovoLOG  FlexPen 100 UNIT/ML FlexPen Generic drug: insulin  aspart Inject 4 Units into the skin 3 (three) times daily with meals. If eating and Blood Glucose (BG) 80 or higher inject 0 units for meal coverage and add correction dose per scale. If not eating, correction dose only. BG <150= 0 unit; BG 150-200= 1 unit; BG 201-250= 2 unit; BG 251-300= 3 unit; BG 301-350= 4 unit; BG 351-400= 5 unit   omeprazole 20 MG capsule Commonly known as: PRILOSEC Take 20 mg by mouth daily.   ondansetron  8 MG tablet Commonly known as: ZOFRAN  TAKE 1 TABLET BY MOUTH EVERY 8 HOURS AS NEEDED FOR NAUSEA OR VOMITING. *NEW PRESCRIPTION REQUEST*   oxyCODONE -acetaminophen  5-325 MG tablet Commonly known as: PERCOCET/ROXICET Take 1 tablet by mouth every 6 (six) hours as needed for severe pain (pain score 7-10).   oxyCODONE -acetaminophen  7.5-325 MG tablet Commonly known as: PERCOCET Take 2 tablets by mouth every 4 (four) hours as needed for moderate pain (pain score 4-6).   polyethylene glycol 17 g packet Commonly known as: MIRALAX / GLYCOLAX Take 17 g by mouth daily.   propranolol  10 MG tablet Commonly known as: INDERAL  Take 1 tablet (10 mg total) by mouth daily.   sertraline  50 MG tablet Commonly known as: ZOLOFT  TAKE 1 & 1/2 TABLETS BY MOUTH ONCE DAILY *NEW PRESCRIPTION REQUEST*   triamcinolone  0.025 % ointment Commonly known as: KENALOG  Apply 1 application topically 2 (two) times daily.   Vitamin D  (Ergocalciferol ) 1.25 MG (50000 UNIT) Caps capsule Commonly known as: DRISDOL  Take 1 capsule (50,000 Units total) by mouth 2 (two) times a week.         ALLERGIES: Allergies   Allergen Reactions   Farxiga  [Dapagliflozin ] Other (See Comments)    necrotizing fascititis  Fish Allergy Anaphylaxis   Other Anaphylaxis and Other (See Comments)    Pt has fish allergy Pt has allergy to butter cookies   Penicillins Rash and Other (See Comments)    Told MD that PCN reaction is rash and that she has taken amoxicillin  before. Tolerated Cefepime  X1 during 8/25 admission.    Bactrim  [Sulfamethoxazole -Trimethoprim ] Diarrhea and Rash    Abdominal cramps        OBJECTIVE:   VITAL SIGNS: There were no vitals taken for this visit.   PHYSICAL EXAM:  General: Pt appears well and is in NAD  Lungs: Clear with good BS B/L  Heart: RRR   Extremities:  Lower extremities - No pretibial edema  Neuro: MS is good with appropriate affect, pt is alert and Ox3    DM foot exam: 08/28/2023  The skin of the feet is intact without sores or ulcerations. The pedal pulses are 2+ on right and 2+ on left. The sensation is intact to a screening 5.07, 10 gram monofilament bilaterally      DATA REVIEWED:  Lab Results  Component Value Date   HGBA1C 6.3 (H) 05/05/2024   HGBA1C 8.8 (H) 03/10/2024   HGBA1C 9.3 (A) 08/28/2023    Latest Reference Range & Units 03/25/24 15:16  Sodium 135 - 145 mmol/L 135  Potassium 3.5 - 5.1 mmol/L 5.0  Chloride 98 - 111 mmol/L 99  CO2 22 - 32 mmol/L 29  Glucose 70 - 99 mg/dL 770 (H)  BUN 6 - 20 mg/dL 9  Creatinine 9.55 - 8.99 mg/dL 9.42  Calcium  8.9 - 10.3 mg/dL 9.3  Anion gap 5 - 15  7  Alkaline Phosphatase 38 - 126 U/L 273 (H)  Albumin  3.5 - 5.0 g/dL 2.9 (L)  AST 15 - 41 U/L 35  ALT 0 - 44 U/L 43  Total Protein 6.5 - 8.1 g/dL 6.5  Total Bilirubin 0.0 - 1.2 mg/dL 0.6  GFR, Est Non African American >60 mL/min >60  Old records , labs and images have been reviewed.    ASSESSMENT / PLAN / RECOMMENDATIONS:   1) Type 2 Diabetes Mellitus, Poorly  controlled, With microalbuminuria complications - Most recent A1c of 8.8 %. Goal A1c < 7.0 %.     -A1c has trended down but continues to be above goal -GLP-1 agonists and SGLT2 inhibitors have been cost prohibitive - A prescription for Dexcom was sent to the pharmacy - I will decrease basal insulin , as she has been noted with tight BG's in the morning - Patient encouraged to continue to use prandial insulin  with correction scale    MEDICATIONS:  -Continue Metformin  750 mg, 1 tablet twice daily  -Decrease Basaglar  36 units daily - Continue NovoLog  4 units with each meal - Continue correction factor: NovoLog  (BG -100/50)  EDUCATION / INSTRUCTIONS: BG monitoring instructions: Patient is instructed to check her blood sugars 1 times a day, fasting. Call [facility] if: BG persistently < 70  I reviewed the Rule of 15 for the treatment of hypoglycemia in detail with the patient. Literature supplied.   2) Diabetic complications:  Eye: Does not have known diabetic retinopathy.  Neuro/ Feet: Does not have known diabetic peripheral neuropathy. Renal: Patient does not have known baseline CKD. She is on an ACEI/ARB at present.   3) Microalbuminuria  -Discussed the importance of optimizing glucose control and compliance with losartan  in the past -Will monitor    F/U in 3 months     Signed electronically by: Stefano  Redgie Butts, MD  [facility] Endocrinology  [facility] [address] [place], [place] [address] Phone: [phone] FAX: [phone]   CC: Georgina Speaks, FNP [address] [place] [address] Phone: [phone]  Fax: [phone]    Return to Endocrinology clinic as below: Future Appointments  Date Time Provider Department Center  06/22/2024 10:50 AM Kessa Fairbairn, Donell Redgie, MD [facility] None  06/23/2024 11:45 AM Johna Alm BROCKS, PT [facility]  06/28/2024  2:00 PM Carita Harlene CHRISTELLA JOSETTA [facility]  07/05/2024  1:15 PM Rosann Washington Greener, PT [facility]  07/05/2024  2:15 PM  Little, Clayborne CROME, RN [facility] None  07/07/2024 11:40 AM [facility] [address]  07/07/2024 12:00 PM Hussami, Tawni SAUNDERS, LCSW [facility] [address]  07/07/2024  2:45 PM Carita Harlene CHRISTELLA, PTA [facility]  07/12/2024  8:30 AM Stacia Glendia BRAVO, MD [facility]  07/12/2024  3:30 PM Sicat, Washington Greener, PT [facility]  07/14/2024  4:15 PM Rosann Washington Greener, PT [facility]  09/05/2024 11:40 AM Georgina Speaks, FNP [facility] [address]  09/20/2024  3:30 PM [facility] None  12/20/2024 10:15 AM [facility] None  12/20/2024 10:40 AM Neomi Johnston DASEN, [name] [facility] None  05/08/2025 10:00 AM Georgina Speaks, FNP [facility] [phone] [address]

## 2024-06-23 ENCOUNTER — Ambulatory Visit: Payer: Self-pay
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
## 2024-06-24 ENCOUNTER — Encounter: Payer: Self-pay | Admitting: Oncology

## 2024-06-27 ENCOUNTER — Other Ambulatory Visit (HOSPITAL_COMMUNITY): Payer: Self-pay

## 2024-06-27 ENCOUNTER — Encounter: Payer: Self-pay | Admitting: Internal Medicine

## 2024-06-27 ENCOUNTER — Telehealth: Payer: Self-pay | Admitting: Internal Medicine

## 2024-06-27 ENCOUNTER — Ambulatory Visit: Admitting: Internal Medicine

## 2024-06-27 ENCOUNTER — Telehealth: Payer: Self-pay

## 2024-06-27 ENCOUNTER — Other Ambulatory Visit

## 2024-06-27 VITALS — BP 144/98 | Ht 65.0 in | Wt 306.0 lb

## 2024-06-27 DIAGNOSIS — E1129 Type 2 diabetes mellitus with other diabetic kidney complication: Secondary | ICD-10-CM | POA: Diagnosis not present

## 2024-06-27 DIAGNOSIS — R809 Proteinuria, unspecified: Secondary | ICD-10-CM

## 2024-06-27 DIAGNOSIS — Z794 Long term (current) use of insulin: Secondary | ICD-10-CM

## 2024-06-27 LAB — METHYLMALONIC ACID, SERUM: Methylmalonic Acid, Quantitative: 113 nmol/L (ref 0–378)

## 2024-06-27 MED ORDER — DEXCOM G7 SENSOR MISC: 1.0000 | 3 refills | Status: AC

## 2024-06-27 NOTE — Progress Notes (Unsigned)
 Name: [name]  MRN/ DOB: [id number], [birth year]   Age/ Sex: 43 y.o., female    PCP: Georgina Speaks, FNP   Reason for Endocrinology Evaluation: Type 2 Diabetes Mellitus     Date of Initial Endocrinology Visit: 11/30/2020    PATIENT IDENTIFIER: [name] is a 43 y.o. female with a past medical history of T2DM, OSA, Dyslipidemia, HTN, and liver cirrhosis (Dx 06/2024). The patient presented for initial endocrinology clinic visit on 11/30/2020 for consultative assistance with her diabetes management.    HPI: [name] was    Diagnosed with gestational diabetes in 2012  And never resolved after delivery Hemoglobin A1c has ranged from 7.1% in 2021, peaking at 11.1 in 2022.  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  She was lost to follow-up for approximately 18 months before her return to our clinic again.   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  She checks her blood sugars 3 times daily. She has not been able to obtain the dexcom. No hypoglycemia   Since her last visit here and during evaluation by GI she was noted to have a low vitamin D  and low PTH She was started on vitamin D3 OTC Imaging showed  liver cirrhosis   She follows with hematology for iron  deficiency anemia secondary to menorrhagia.  She was encouraged to follow-up with OB/GYN for better control of her menstrual cycles.  She continues on oral ferrous sulfate    She continues with healing of labial infection. She [facility] with occasional nausea , On Zofran   Has alternating constipation with diarrhea    HOME DIABETES REGIMEN: Metformin  750 mg XR, 2 tabs daily Basaglar  40 units daily  Novolog  4 units TIDQAC Correction factor: NovoLog  (BG -100/50 ) Vitamin D  2000 units daily     Statin: yes ACE-I/ARB: yes Prior Diabetic Education: yes     METER DOWNLOAD SUMMARY: 11/10-11/24/2025 Average Number Tests/Day = 2.6 Overall Mean FS Glucose = 167   BG Ranges: Low = 99 High = 357   Hypoglycemic Events/30 Days: BG < 50 = 0 Episodes of symptomatic severe hypoglycemia = 0      DIABETIC COMPLICATIONS: Microvascular complications:  Denies: CKD,  Neuropathy, retinopathy  Last eye exam: Completed   Macrovascular complications:   Denies: CAD, PVD, CVA   PAST HISTORY: Past Medical History:  Past Medical History:  Diagnosis Date   Abnormal Pap smear    f/u wnl   Asthma    DM2 (diabetes mellitus, type 2) (HCC)    DM2 (diabetes mellitus, type 2) (HCC) 09/13/2012   AIC 9.7 09-24-12 Referred to Dr. Jarold after consult with Dr. Darcel Appt. Scheduled  KYM Armer, CNM, FNP    Fibroid    Gestational diabetes    Hypertension    Morbid obesity (HCC)    Sleep apnea    not on cpap, diagnosed when pregnant   Urinary tract infection    Past Surgical History:  Past Surgical History:  Procedure Laterality Date   BREAST BIOPSY Right 07/24/2022   US  RT BREAST BX W LOC DEV 1ST LESION IMG BX SPEC US  GUIDE 07/24/2022 [facility] MAMMOGRAPHY   CESAREAN SECTION     DILATION AND CURETTAGE OF UTERUS     INCISION AND DRAINAGE OF WOUND N/A 03/13/2024   Procedure: IRRIGATION AND  DEBRIDEMENT WOUND;  Surgeon: Polly Cordella LABOR, MD;  Location: [facility] OR;  Service: General;  Laterality: N/A;   INCISION AND DRAINAGE PERIRECTAL ABSCESS N/A 03/16/2024   Procedure: INCISION AND DRAINAGE, ABSCESS, PERIRECTAL;  Surgeon: Lyndel Deward PARAS, MD;  Location: [facility] OR;  Service: General;  Laterality: N/A;   IRRIGATION AND DEBRIDEMENT OF NECROTIZING SOFT TISSUE INFECTION Left 03/18/2024   Procedure: EXAM UNDER ANESTHESIA; INCISION, DRAINAGE, AND DEBRIDEMENT OF NECROTIZING SOFT TISSUE INFECTION LEFT LABIA AND THIGH;  Surgeon: Stechschulte, Deward PARAS, MD;  Location: [facility] OR;  Service: General;  Laterality: Left;   RECTAL EXAM UNDER ANESTHESIA N/A 03/13/2024   Procedure: EXAM UNDER ANESTHESIA,;  Surgeon: Polly Cordella LABOR, MD;  Location: [facility] OR;  Service: General;  Laterality: N/A;  I AND D left labia necrotizing infection   TUBAL LIGATION     WOUND EXPLORATION Left 03/19/2024   Procedure: EXPLORATION OF LEFT GROIN WOUND;  Surgeon: Dasie Leonor CROME, MD;  Location: [facility] OR;  Service: General;  Laterality: Left;  LEFT GROIN WOUND EXPLORATION    Social History:  reports that she quit smoking about 4 years ago. Her smoking use included cigarettes. She has quit using smokeless tobacco. She reports that she does not drink alcohol and does not use drugs. Family History:  Family History  Problem Relation Age of Onset   Allergies Mother    Asthma Mother    Hypertension Mother    Diabetes Mother    Diabetes Father    Bladder Cancer Maternal Uncle    Diabetes Maternal Grandmother    Heart disease Maternal Grandmother    Liver cancer Other    Liver cancer Other    Anesthesia problems Neg Hx    Hypotension Neg Hx    Malignant hyperthermia Neg Hx    Pseudochol deficiency Neg Hx    Hearing loss Neg Hx    Breast cancer Neg Hx      HOME MEDICATIONS: Allergies as of 06/27/2024       Reactions   Farxiga  [dapagliflozin ] Other (See Comments)   necrotizing fascititis   Fish Allergy Anaphylaxis   Other  Anaphylaxis, Other (See Comments)   Pt has fish allergy Pt has allergy to butter cookies   Penicillins Rash, Other (See Comments)   Told MD that PCN reaction is rash and that she has taken amoxicillin  before. Tolerated Cefepime  X1 during 8/25 admission.    Bactrim  [sulfamethoxazole -trimethoprim ] Diarrhea, Rash   Abdominal cramps        Medication List        Accurate as of June 27, 2024 10:13 AM. If you have any questions, ask your nurse or doctor.          STOP taking these medications    Dexcom G7 Sensor Misc Stopped by: Donell PARAS Tylisha Danis   oxyCODONE -acetaminophen  5-325 MG tablet Commonly known as: PERCOCET/ROXICET Stopped by:  Anupama Piehl J Inioluwa Boulay   oxyCODONE -acetaminophen  7.5-325 MG tablet Commonly known as: PERCOCET Stopped by: Foxx Klarich J Jonia Oakey   polyethylene glycol 17 g packet Commonly known as: MIRALAX / GLYCOLAX Stopped by: Oluwadarasimi Favor J Elaf Clauson   triamcinolone  0.025 % ointment Commonly known as: KENALOG  Stopped by: Lashunta Frieden J Jakolby Sedivy       TAKE these medications    Accu-Chek Guide test strip Generic drug: glucose blood 1 each by Other route daily in the afternoon. Use as instructed   Accu-Chek Guide Test test strip Generic drug: glucose blood Use 3 (three) times daily as directed to check blood sugar.   Accu-Chek Guide Test test strip Generic drug: glucose blood USED TO TEST BLOOD SUGARS up to  three IMES DAILY   Accu-Chek Guide w/Device Kit Use 3 (three) times daily as directed   accu-chek multiclix lancets USE TO CHECK BLOOD SUGARS UP TO 4 TIMES DAILY  DX CODE:E11.9   Accu-Chek Softclix Lancets lancets USED TO TEST BLOOD SUGARS 3-4 TIMES DAILY *NEW PRESCRIPTION REQUEST*   albuterol  108 (90 Base) MCG/ACT inhaler Commonly known as: VENTOLIN  HFA Inhale 1-2 puffs into the lungs every 6 (six) hours as needed for wheezing or shortness of breath.   Allergy Relief Cetirizine  10 MG tablet Generic drug: cetirizine  TAKE 1 TABLET BY  MOUTH EVERY DAY *NEW PRESCRIPTION REQUEST*   atorvastatin  20 MG tablet Commonly known as: LIPITOR TAKE 1 TABLET BY MOUTH EVERY DAY *NEW PRESCRIPTION REQUEST*   Basaglar  KwikPen 100 UNIT/ML Inject 36 Units into the skin daily.   ferrous sulfate  325 (65 FE) MG tablet Take 1 tablet (325 mg total) by mouth daily with breakfast. Please take with a source of Vitamin C   fluticasone  50 MCG/ACT nasal spray Commonly known as: FLONASE  Place 1-2 sprays into both nostrils daily.   Insulin  Pen Needle 31G X 8 MM Misc 1 Device by Does not apply route in the morning, at noon, in the evening, and at bedtime.   Lancet Device Misc Use 3 (three) times daily. May dispense any manufacturer covered by patient's insurance.   levonorgestrel  20 MCG/24HR IUD Commonly known as: MIRENA  1 each by Intrauterine route once.   losartan  50 MG tablet Commonly known as: Cozaar  Take 1 tablet (50 mg total) by mouth daily.   metFORMIN  750 MG 24 hr tablet Commonly known as: GLUCOPHAGE -XR TAKE 2 TABLETS (1,500 MG TOTAL) BY MOUTH EVERY DAY WITH BREAKFAST *NEW PRESCRIPTION REQUEST*   methocarbamol  500 MG tablet Commonly known as: ROBAXIN  TAKE 1 TABLET BY MOUTH 3 TIMES DAILY   NovoLOG  FlexPen 100 UNIT/ML FlexPen Generic drug: insulin  aspart Inject 4 Units into the skin 3 (three) times daily with meals. If eating and Blood Glucose (BG) 80 or higher inject 0 units for meal coverage and add correction dose per scale. If not eating, correction dose only. BG <150= 0 unit; BG 150-200= 1 unit; BG 201-250= 2 unit; BG 251-300= 3 unit; BG 301-350= 4 unit; BG 351-400= 5 unit   omeprazole 20 MG capsule Commonly known as: PRILOSEC Take 20 mg by mouth daily.   ondansetron  8 MG tablet Commonly known as: ZOFRAN  TAKE 1 TABLET BY MOUTH EVERY 8 HOURS AS NEEDED FOR NAUSEA OR VOMITING. *NEW PRESCRIPTION REQUEST*   propranolol  10 MG tablet Commonly known as: INDERAL  Take 1 tablet (10 mg total) by mouth daily.   sertraline  50 MG  tablet Commonly known as: ZOLOFT  TAKE 1 & 1/2 TABLETS BY MOUTH ONCE DAILY *NEW PRESCRIPTION REQUEST*   Vitamin D  (Ergocalciferol ) 1.25 MG (50000  UNIT) Caps capsule Commonly known as: DRISDOL  Take 1 capsule (50,000 Units total) by mouth 2 (two) times a week.         ALLERGIES: Allergies  Allergen Reactions   Farxiga  [Dapagliflozin ] Other (See Comments)    necrotizing fascititis   Fish Allergy Anaphylaxis   Other Anaphylaxis and Other (See Comments)    Pt has fish allergy Pt has allergy to butter cookies   Penicillins Rash and Other (See Comments)    Told MD that PCN reaction is rash and that she has taken amoxicillin  before. Tolerated Cefepime  X1 during 8/25 admission.    Bactrim  [Sulfamethoxazole -Trimethoprim ] Diarrhea and Rash    Abdominal cramps        OBJECTIVE:   VITAL SIGNS: BP (!) 144/98   Ht 5' 5 (1.651 m)   Wt (!) 306 lb (138.8 kg)   BMI 50.92 kg/m    PHYSICAL EXAM:  General: Pt appears well and is in NAD  Lungs: Clear with good BS B/L  Heart: RRR   Extremities:  Lower extremities - No pretibial edema  Neuro: MS is good with appropriate affect, pt is alert and Ox3    DM foot exam: 08/28/2023  The skin of the feet is intact without sores or ulcerations. The pedal pulses are 2+ on right and 2+ on left. The sensation is intact to a screening 5.07, 10 gram monofilament bilaterally      DATA REVIEWED:  Lab Results  Component Value Date   HGBA1C 6.3 (H) 05/05/2024   HGBA1C 8.8 (H) 03/10/2024   HGBA1C 9.3 (A) 08/28/2023     Latest Reference Range & Units 06/27/24 10:47  Sodium 135 - 146 mmol/L 138  Potassium 3.5 - 5.3 mmol/L 4.4  Chloride 98 - 110 mmol/L 102  CO2 20 - 32 mmol/L 25  Glucose 65 - 139 mg/dL 882  BUN 7 - 25 mg/dL 18  Creatinine 9.49 - 9.00 mg/dL 9.30  Calcium  8.6 - 10.2 mg/dL 87.8 (H)  BUN/Creatinine Ratio 6 - 22 (calc) SEE NOTE:  eGFR > OR = 60 mL/min/1.21m2 111    Latest Reference Range & Units 06/27/24 10:47  Vitamin D ,  25-Hydroxy 30 - 100 ng/mL 26 (L)    Latest Reference Range & Units 06/27/24 10:47  PTH, Intact 16 - 77 pg/mL <6 (L)  (L): Data is abnormally low     Latest Reference Range & Units 03/25/24 15:16  Sodium 135 - 145 mmol/L 135  Potassium 3.5 - 5.1 mmol/L 5.0  Chloride 98 - 111 mmol/L 99  CO2 22 - 32 mmol/L 29  Glucose 70 - 99 mg/dL 770 (H)  BUN 6 - 20 mg/dL 9  Creatinine 9.55 - 8.99 mg/dL 9.42  Calcium  8.9 - 10.3 mg/dL 9.3  Anion gap 5 - 15  7  Alkaline Phosphatase 38 - 126 U/L 273 (H)  Albumin  3.5 - 5.0 g/dL 2.9 (L)  AST 15 - 41 U/L 35  ALT 0 - 44 U/L 43  Total Protein 6.5 - 8.1 g/dL 6.5  Total Bilirubin 0.0 - 1.2 mg/dL 0.6  GFR, Est Non African American >60 mL/min >60  Old records , labs and images have been reviewed.    ASSESSMENT / PLAN / RECOMMENDATIONS:   1) Type 2 Diabetes Mellitus, Optimally  controlled, With microalbuminuria complications - Most recent A1c of 6.3 %. Goal A1c < 7.0 %.    -A1c remains optimal -GLP-1 agonists and SGLT2 inhibitors have been cost prohibitive - A prescription for Dexcom was sent to  the pharmacy, but she has not been able to obtain it, a new prescription was sent and a message was sent to our prior authorization  team to proceed with prior authorization - I would avoid SGLT2 inhibitors due to severe labial abscess complicated by necrotizing fasciitis in 2025  MEDICATIONS:  -Continue Metformin  750 mg, 2 tabs daily  - Continue Basaglar  40 units daily - Continue NovoLog  4 units with each meal - Continue correction factor: NovoLog  (BG -130/30)  EDUCATION / INSTRUCTIONS: BG monitoring instructions: Patient is instructed to check her blood sugars 1 times a day, fasting. Call [facility] if: BG persistently < 70  I reviewed the Rule of 15 for the treatment of hypoglycemia in detail with the patient. Literature supplied.   2) Diabetic complications:  Eye: Does not have known diabetic retinopathy.  Neuro/ Feet: Does not have  known diabetic peripheral neuropathy. Renal: Patient does not have known baseline CKD. She is on an ACEI/ARB at present.   3) Microalbuminuria  -Discussed the importance of optimizing glucose control and compliance with losartan  in the past -Will monitor  3.  Liver cirrhosis:   - Per GI - Most likely secondary to fatty liver    4.  HTN:  - Will defer to cardiology, she is currently wearing a heart monitor    5.  Hypercalcemia:   - The patient has been noted with hypercalcemia in March, 2025 with a max level of 12.1 MGs/DL in August, 7974 (no concomitant albumin  at the time) but later that day she had a serum calcium  of 11, with an albumin  level of 1.9,(corrected serum calcium  12.68 mgdL) .  PTH has been undetectable - I will repeat serum calcium , albumin , PTH, and PTH RP - She is currently on OTC vitamin D3  F/U in 3 months     Signed electronically by: Stefano Redgie Butts, MD  [facility]  [facility] [address] [place], [place] [address] Phone: [phone] FAX: [phone]   CC: Georgina Speaks, FNP [address] [place] [address] Phone: [phone]  Fax: [phone]    Return to Endocrinology clinic as below: Future Appointments  Date Time Provider Department Center  06/28/2024  2:00 PM Carita Harlene CHRISTELLA JOSETTA [name]  07/05/2024  1:15 PM Sicat, Washington Greener, PT [facility]  07/05/2024  2:15 PM Little, Clayborne CROME, RN [facility] None  07/07/2024 11:40 AM [facility] [address]  07/07/2024 12:00 PM Hussami, Tawni SAUNDERS, LCSW [facility] [address]  07/07/2024  2:45 PM Carita Harlene CHRISTELLA, PTA [facility]  07/12/2024  8:30 AM Stacia Glendia BRAVO, MD [facility]  07/12/2024  3:30 PM Sicat, Washington Greener, PT [facility]  07/14/2024  4:15 PM Rosann Washington Greener, PT [facility]  09/05/2024 11:40 AM Georgina Speaks, FNP [facility] [address]  09/20/2024  3:30 PM  [facility] None  12/20/2024 10:15 AM [facility] None  12/20/2024 10:40 AM Neomi Johnston DASEN, [name] [facility] None  05/08/2025 10:00 AM Georgina Speaks, FNP [facility] [phone] [address]

## 2024-06-27 NOTE — Patient Instructions (Signed)
 Metformin  750 mg XR, 2 tabs daily Basaglar  40 units daily  Novolog  4 units TIDQAC Novolog  correctional insulin : ADD extra units on insulin  to your meal-time Novolog  dose if your blood sugars are higher than 160. Use the scale below to help guide you:   Blood sugar before meal Number of units to inject  Less than 160 0 unit  161 -  190 1 units  191 -  220 2 units  221 -  250 3 units  251 -  280 4 units  281 -  310 5 units  311 -  340 6 units   HOW TO TREAT LOW BLOOD SUGARS (Blood sugar LESS THAN 70 MG/DL) Please follow the RULE OF 15 for the treatment of hypoglycemia treatment (when your (blood sugars are less than 70 mg/dL)   STEP 1: Take 15 grams of carbohydrates when your blood sugar is low, which includes:  3-4 GLUCOSE TABS  OR 3-4 OZ OF JUICE OR REGULAR SODA OR ONE TUBE OF GLUCOSE GEL    STEP 2: RECHECK blood sugar in 15 MINUTES STEP 3: If your blood sugar is still low at the 15 minute recheck --> then, go back to STEP 1 and treat AGAIN with another 15 grams of carbohydrates.

## 2024-06-27 NOTE — Telephone Encounter (Signed)
 Pharmacy Patient Advocate Encounter   Received notification from Pt Calls Messages that prior authorization for Dexcom G7 sensor is required/requested.   Insurance verification completed.   The patient is insured through [employer].   Per test claim: The current 30 day co-pay is, $35.  No PA needed at this time. This test claim was processed through [facility]- copay amounts may vary at other pharmacies due to pharmacy/plan contracts, or as the patient moves through the different stages of their insurance plan.     Insurance will only cover 30 day fills

## 2024-06-27 NOTE — Telephone Encounter (Signed)
 Can you please proceed with prior authorization for Dexcom?   Thanks

## 2024-06-28 ENCOUNTER — Ambulatory Visit: Payer: Self-pay | Admitting: Internal Medicine

## 2024-06-28 ENCOUNTER — Ambulatory Visit: Admitting: Physical Therapy

## 2024-06-28 ENCOUNTER — Encounter: Payer: Self-pay | Admitting: Physical Therapy

## 2024-06-28 DIAGNOSIS — M6281 Muscle weakness (generalized): Secondary | ICD-10-CM | POA: Diagnosis not present

## 2024-06-28 DIAGNOSIS — R52 Pain, unspecified: Secondary | ICD-10-CM
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
## 2024-06-28 NOTE — Telephone Encounter (Signed)
 Please contact the patient and let her know that her calcium  is EXTREMELY  elevated.  To avoid her going to the hospital, I would like for her to double up on the amount of water  that she is drinking.  If she is currently drinking 2 bottles of water  she needs to increase it to 4 and so on    I would like to recheck her calcium  in 1 week   If she develops new symptoms of extreme fatigue, extreme thirst or changes in mental status she will need to report to the hospital for IV fluids   I will send a message to her oncologist to further investigate this because this is not related to her parathyroid  (which typically endocrine manages), she will need to be evaluated by oncology    Thank you

## 2024-07-05 ENCOUNTER — Ambulatory Visit: Attending: Student

## 2024-07-05 ENCOUNTER — Telehealth

## 2024-07-05 DIAGNOSIS — M6281 Muscle weakness (generalized): Secondary | ICD-10-CM | POA: Diagnosis present

## 2024-07-05 DIAGNOSIS — R52 Pain, unspecified: Secondary | ICD-10-CM | POA: Insufficient documentation
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
## 2024-07-06 ENCOUNTER — Other Ambulatory Visit

## 2024-07-06 LAB — BASIC METABOLIC PANEL WITH GFR
BUN: 15 mg/dL (ref 7–25)
CO2: 25 mmol/L (ref 20–32)
Calcium: 11.9 mg/dL — ABNORMAL HIGH (ref 8.6–10.2)
Chloride: 100 mmol/L (ref 98–110)
Creat: 0.63 mg/dL (ref 0.50–0.99)
Glucose, Bld: 119 mg/dL — ABNORMAL HIGH (ref 65–99)
Potassium: 4.8 mmol/L (ref 3.5–5.3)
Sodium: 137 mmol/L (ref 135–146)
eGFR: 114 mL/min/1.73m2 (ref >=60)

## 2024-07-06 LAB — ALBUMIN: Albumin: 3.4 g/dL — ABNORMAL LOW (ref 3.6–5.1)

## 2024-07-07 ENCOUNTER — Ambulatory Visit (INDEPENDENT_AMBULATORY_CARE_PROVIDER_SITE_OTHER): Payer: Self-pay | Admitting: Licensed Clinical Social Worker

## 2024-07-07 ENCOUNTER — Encounter: Payer: Self-pay | Admitting: Physical Therapy

## 2024-07-07 ENCOUNTER — Ambulatory Visit: Admitting: Physical Therapy

## 2024-07-07 ENCOUNTER — Ambulatory Visit

## 2024-07-07 DIAGNOSIS — F331 Major depressive disorder, recurrent, moderate: Secondary | ICD-10-CM

## 2024-07-07 DIAGNOSIS — F411 Generalized anxiety disorder: Secondary | ICD-10-CM

## 2024-07-07 DIAGNOSIS — M6281 Muscle weakness (generalized): Secondary | ICD-10-CM

## 2024-07-07 DIAGNOSIS — R52 Pain, unspecified: Secondary | ICD-10-CM

## 2024-07-07 LAB — BASIC METABOLIC PANEL WITH GFR
BUN: 18 mg/dL (ref 7–25)
CO2: 25 mmol/L (ref 20–32)
Calcium: 12.1 mg/dL — ABNORMAL HIGH (ref 8.6–10.2)
Chloride: 102 mmol/L (ref 98–110)
Creat: 0.69 mg/dL (ref 0.50–0.99)
Glucose, Bld: 117 mg/dL (ref 65–139)
Potassium: 4.4 mmol/L (ref 3.5–5.3)
Sodium: 138 mmol/L (ref 135–146)
eGFR: 111 mL/min/1.73m2 (ref >=60)

## 2024-07-07 LAB — VITAMIN A: Vitamin A (Retinoic Acid): 22 ug/dL — ABNORMAL LOW (ref 38–98)

## 2024-07-07 LAB — MICROALBUMIN / CREATININE URINE RATIO
Creatinine, Urine: 109 mg/dL (ref 20–275)
Microalb Creat Ratio: 108 mg/g{creat} — ABNORMAL HIGH (ref <30)
Microalb, Ur: 11.8 mg/dL

## 2024-07-07 LAB — VITAMIN D 25 HYDROXY (VIT D DEFICIENCY, FRACTURES): Vit D, 25-Hydroxy: 26 ng/mL — ABNORMAL LOW (ref 30–100)

## 2024-07-07 LAB — PARATHYROID HORMONE, INTACT (NO CA): PTH: <6 pg/mL — ABNORMAL LOW (ref 16–77)

## 2024-07-07 LAB — ALBUMIN: Albumin: 3.4 g/dL — ABNORMAL LOW (ref 3.6–5.1)

## 2024-07-07 LAB — PTH-RELATED PEPTIDE: PTH-Related Protein (PTH-RP): 10 pg/mL — ABNORMAL LOW (ref 11–20)
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
## 2024-07-07 NOTE — Patient Instructions (Addendum)
 Using Behavioral Activation to manage stress/depression symptoms     Identify/understand your own mood triggers.   Structure your day--get up around the same time, eat meals/snacks around the same time, go to bed around the same time.   Purposefully schedule self care time and time to complete tasks. This can include quiet time.  Stimulate your brain--go for a walk, text/call a friend or family member, if you are indoors--go outside (and vice versa), go for a drive, go to a store with bright colors and bright lights. Try to do things in a different way--drive to your favorite places using an alternative route, or instead of starting on the right side of the grocery store when shopping, start on the left side. You might feel a bit uncomfortable doing things outside of the comfort zone, but this is helping the brain create new neural pathways and is very healthy for brain/emotional health.   Physical movement based on your ability. If you can go for a walk, do stretches, even waving your hands to music can trigger feel-good endorphins in the brain and help release physical tension we all hold in our bodies.  Even 5 minutes can make a difference.   Be intentional about doing things that bring you joy (or used to bring you joy), and look for the things in every day that make you happy.  Seek those glimmers of joy each day.  Set a timer for 5 minutes for a harder task (ex. Laundry, washing dishes).  Allow yourself to work distraction-free for 5 minutes, then stop when the timer goes off. If you need a break, take a break. If you want to continue working then set another timer for whatever time you choose.   Limit or eliminate substance use including alcohol, marijuana, or recreational use of prescription medication.  Let in the light!! Open the window blinds, curtains and let natural light in. Even sitting near a window or sitting outside can boost your mood, especially in the wintertime when  there is less daylight.   If you take medication to manage symptoms, remember to take all medications as they are prescribed (please read all labels!!)    Things to envision for ourselves to to improve inspiration, motivation, and initiative :  improving physical wellness, focus on family relationships, focusing on our own mental/emotional well being, being a part of a bigger community, finding a hobby, being a part of something that fosters personal growth, engaging socially with others (even digitally!!!)    ANXIETY/PANIC EPISODE MANAGEMENT (CBT/MINDFULNESS BASED)   If you are in a highly stimulating or triggering environment, change your location to a less stimulating or safer environment .   Stimulate your senses by tasting/eating a sour candy (such as a lemon drop) or a strong flavored cough drop.  This triggers smell, taste, and touch since we  have had lots of nerve endings inside of your mouth.   Practice slow, controlled breathing to avoid hyperventilation.  Breathing into your nose for the count of 4 inside your head, holding your breath for the count of 4 inside your head, and exhale slowly counting to 8 inside your head.  This is called 4-4-8 breathing, or triangle breathing. (Controlled breathing). This helps manage panic, anxiety, anger, and tearfulness.  Additional grounding exercises include rubbing your hands softly together, or wiggling toes inside your shoes, pretending to grasp the floor with your toes.    Listen to calming music or sounds  Count backwards in your head by  twos or tens or recite multiplication tables in your head.  Visualize a calming, happy place and identify 5 explicit details about this place (color, temperature, smells, visual details, etc.)  Splash cold water on your face or hold an ice cube in your hand (alternate hands)  Clench and release muscle groups (hands, shoulders, facial muscles)  Engage in soothing activities to recover after a  stressful/anxious episode such as: drinking a warm beverage, sitting in your favorite comfortable location, positive physical contact with a pet or weighted blanket, using positive self talk/positive affirmations.   Download PTSD Coach app for your phone or tablet--its free and has lots of tips to help you manage panic episodes no matter where you are!     Sleep Hygiene Tips  Bedroom Environment - Keep your bedroom cool, quiet, and dark - Use blackout curtains or a sleep mask - Limit noise with earplugs or a white noise machine - Invest in a comfortable mattress and pillows - Remove electronic devices or dim brightness  Sleep Schedule - Go to bed and wake up at the same time every day, even on weekends - Avoid long naps during the day (limit to 2030 minutes if needed) - Establish a relaxing pre-sleep routine (e.g., reading, gentle stretching)  Diet & Substances - Avoid caffeine, nicotine, and alcohol close to bedtime - Don't go to bed hungry or overly full - Limit fluid intake in the evening to reduce nighttime bathroom trips  Technology & Stimulation - Avoid screens (phones, tablets, TVs) at least 30-60 minutes before bed - Use blue light filters if screen use is unavoidable - Don't work or watch intense/triggering content in bed  Mental & Physical Health - Practice relaxation techniques like deep breathing, meditation, or progressive muscle relaxation - Exercise regularly, but not too close to bedtime - Manage stress and anxiety through journaling, therapy, or mindfulness  When You Cant Sleep - Get out of bed if you can't sleep after 20 minutes - Do something calming in dim light, then return to bed when sleepy - Avoid clock-watching, which can increase anxiety   Emergency Resources:  National Suicide & Crisis Lifeline: Call or text 988  Crisis Text Line: Text HOME to 939-516-3593  [facility]  [address], [place], [place] [address] [phone]  or [phone] WALK[facility] 24/7 FOR ANYONE [address] [place], [place]  [phone] Fax: [phone] [email or web address] *Interpreters available *Accepts all insurance and uninsured for Urgent Care needs *Accepts Medicaid and uninsured for outpatient treatment (below)

## 2024-07-12 ENCOUNTER — Ambulatory Visit: Admitting: Gastroenterology

## 2024-07-12 ENCOUNTER — Ambulatory Visit

## 2024-07-12 ENCOUNTER — Telehealth: Payer: Self-pay | Admitting: Internal Medicine

## 2024-07-12 DIAGNOSIS — M6281 Muscle weakness (generalized): Secondary | ICD-10-CM | POA: Diagnosis not present

## 2024-07-12 DIAGNOSIS — R52 Pain, unspecified: Secondary | ICD-10-CM

## 2024-07-12 NOTE — Telephone Encounter (Signed)
 Discussed lab results with the pt on 07/12/2024 at 10 AM    I have encouraged the patient to continue with hydration  She denies any polyuria or constipation, but has noted urinary frequency due to increased water  intake   She has not heard from her regular hematology office.  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   Patient assures me she does not use any type of fillers  She has no personal history of sarcoidosis, but mother has a diagnosis of sarcoidosis
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
## 2024-07-13 ENCOUNTER — Telehealth: Payer: Self-pay | Admitting: Pharmacy Technician

## 2024-07-13 ENCOUNTER — Telehealth (INDEPENDENT_AMBULATORY_CARE_PROVIDER_SITE_OTHER): Payer: Self-pay | Admitting: Licensed Clinical Social Worker

## 2024-07-13 ENCOUNTER — Telehealth (HOSPITAL_COMMUNITY): Payer: Self-pay | Admitting: Internal Medicine

## 2024-07-13 DIAGNOSIS — F331 Major depressive disorder, recurrent, moderate: Secondary | ICD-10-CM

## 2024-07-13 DIAGNOSIS — F411 Generalized anxiety disorder: Secondary | ICD-10-CM

## 2024-07-13 NOTE — Telephone Encounter (Signed)
Noted - thank you very much!

## 2024-07-13 NOTE — BH Specialist Note (Signed)
 Attestation signed by Warren Becker, PMHNP, DNP 07/13/2024 8:18 AM   Collaborative Care Psychiatric Consultant Case Review   Assessment/Provisional Diagnosis 43 year old female with history of DM2, OSA, sepsis, cirrhosis of the liver, and other medical issues. The patient is referred for anxiety and depression.   Provisional Diagnosis: # MDD, Recurrent, moderate # GAD   Recommendation 1. Continue vitamin D  supplement. 2. Recommend increasing the Zoloft  50 mg to 100 mg daily and propranolol  10 mg daily to BID PRN 3. BH specialist to follow up.   Thank you for your consult. Please contact our collaborative care team for any questions or concerns.   I spent 20 minutes chart reviewing, discussing with [facility] Speicalist and documenting in the chart.   The above treatment considerations and suggestions are based on consultation with the [facility] specialist and/or PCP and a review of information available in the shared registry and the patient's Electronic Health Record (EHR). I have not personally examined the patient. All recommendations should be implemented with consideration of the patient's relevant prior history and current clinical status. Please feel free to call me with any questions about the care of this patient  Virtual Behavioral Health Treatment Plan Team Note  MRN: [id number] NAME: [name]  DATE: 07/13/24  Start time: Start Time: 0850 End time: Stop Time: 0910 Total time: Total Time in Minutes (Visit): 20  Total number of Virtual BH Treatment Team Plan encounters: 1/4  Treatment Team Attendees: Tawni Brisker, LCSW and Sharlot Becker, DNP   Diagnoses: No diagnosis found.  Goals, Interventions and Follow-up Plan Goals: Increase healthy adjustment to current life circumstances Interventions: Mindfulness or Relaxation Training Behavioral Activation Medication Monitoring  Medication Management Recommendations: 1. Continue vitamin D  supplement. 2. Recommend increasing  the Zoloft  50 mg to 100 mg daily and propranolol  10 mg daily to BID PRN  Follow-up Plan: [facility] collaborative care team--referrals as needed  History of the present illness Presenting Problem/Current Symptoms:  [name] is a 11 y.o. adult patient with history of mild depression, anxiety, stress seen in consultation at the request of [name] FNP for establishment of [facility] management. Pt is currently taking the following psychiatric medications: Sertraline  50, propranolol  10 mg. Current symptoms include: Feeling down depressed hopeless, little interest or pleasure in doing things, feeling tired/low energy, trouble focusing concentrating, feeling nervous/anxious, worrying, increased irritability and occasional insomnia (due to worries and chronic pain).  Pt denies SI, HI, or AVH at time of session. Pt denies substance use.   Psychiatric History   Have you ever been treated for a mental health problem? Yes If Yes, when were you treated and whom did you see (psychiatrist/counselor) ?        When currently              name of provider primary care physician    Psychiatric History  Depression: Yes Anxiety: Yes Mania: No Psychosis: No PTSD symptoms: Yes--anxiety, depression especially since most recent acute medical event   Past Psychiatric History/Hospitalization(s): Hospitalization for psychiatric illness: No Prior Suicide Attempts: No Prior Self-injurious behavior: No   Have you ever had thoughts of harming yourself or others or attempted suicide? No plan to harm self or others  Psychosocial stressors Flowsheet Row Integrated Behavioral Health from 07/07/2024 in [facility]  Current Stressors Family illness, Finances, Hospitalization  Familial Stressors None  Sleep Decreased, Frequent awakening  Appetite Decreased, Increased, Other (Comment)  [fluctuating at times]  Coping ability  Overwhelmed  Patient taking medications  as prescribed Yes    Self-harm Behaviors Risk Assessment Flowsheet Row Integrated Behavioral Health from 07/07/2024 in [facility]  Self-harm risk factors Loss (financial/interpersonal/professional), Social withdrawal/isolation  Have you recently had any thoughts about harming yourself? No    Screenings PHQ-9 Assessments:     07/07/2024    1:26 PM 06/14/2024    9:18 AM 03/25/2024    2:33 PM  Depression screen PHQ 2/9  Decreased Interest 2 2 3   Down, Depressed, Hopeless 1 2 3   PHQ - 2 Score 3 4 6   Altered sleeping 1 2   Tired, decreased energy 3 2   Change in appetite 2 3   Feeling bad or failure about yourself  0 0   Trouble concentrating 2 3   Moving slowly or fidgety/restless 1 0   Suicidal thoughts 0 0   PHQ-9 Score 12 14   Difficult doing work/chores Somewhat difficult Somewhat difficult    GAD-7 Assessments:     07/07/2024    1:27 PM 06/14/2024    9:18 AM 03/10/2024   12:16 PM 11/02/2023    9:55 AM  GAD 7 : Generalized Anxiety Score  Nervous, Anxious, on Edge 2 2 1 2   Control/stop worrying 2 2 2 2   Worry too much - different things 2 2 2  0  Trouble relaxing 2 3 2  0  Restless 2 0 0 0  Easily annoyed or irritable 2 2 1 2   Afraid - awful might happen 2 0 0 0  Total GAD 7 Score 14 11 8 6   Anxiety Difficulty Somewhat difficult Somewhat difficult Not difficult at all Somewhat difficult    Past Medical History Past Medical History:  Diagnosis Date   Abnormal Pap smear    f/u wnl   Asthma    DM2 (diabetes mellitus, type 2) (HCC)    DM2 (diabetes mellitus, type 2) (HCC) 09/13/2012   AIC 9.7 09-24-12 Referred to Dr. Jarold after consult with Dr. Darcel Appt. Scheduled  C. Armer, CNM, FNP    Fibroid    Gestational diabetes    Hypertension    Morbid obesity (HCC)    Sleep apnea    not on cpap, diagnosed when pregnant   Urinary tract infection     Vital signs: There were no vitals filed for this visit.  Allergies:   Allergies as of 07/13/2024 - Review Complete 07/12/2024  Allergen Reaction Noted   Farxiga  [dapagliflozin ] Other (See Comments) 03/28/2024   Fish allergy Anaphylaxis 07/07/2017   Other Anaphylaxis and Other (See Comments) 08/24/2011   Penicillins Rash and Other (See Comments) 12/10/2010   Bactrim  [sulfamethoxazole -trimethoprim ] Diarrhea and Rash 01/10/2020    Medication History Current medications:  Outpatient Encounter Medications as of 07/13/2024  Medication Sig   Accu-Chek Softclix Lancets lancets USED TO TEST BLOOD SUGARS 3-4 TIMES DAILY *NEW PRESCRIPTION REQUEST*   albuterol  (VENTOLIN  HFA) 108 (90 Base) MCG/ACT inhaler Inhale 1-2 puffs into the lungs every 6 (six) hours as needed for wheezing or shortness of breath.   atorvastatin  (LIPITOR) 20 MG tablet TAKE 1 TABLET BY MOUTH EVERY DAY *NEW PRESCRIPTION REQUEST*   Blood Glucose Monitoring Suppl (BLOOD GLUCOSE MONITOR SYSTEM) w/Device KIT Use 3 (three) times daily as directed   cetirizine  (ALLERGY RELIEF CETIRIZINE ) 10 MG tablet TAKE 1 TABLET BY MOUTH EVERY DAY *NEW PRESCRIPTION REQUEST*   Continuous Glucose Sensor (DEXCOM G7 SENSOR) MISC 1 Device by Does not apply route as directed.   ferrous sulfate   325 (65 FE) MG tablet Take 1 tablet (325 mg total) by mouth daily with breakfast. Please take with a source of Vitamin C   fluticasone  (FLONASE ) 50 MCG/ACT nasal spray Place 1-2 sprays into both nostrils daily.   glucose blood (ACCU-CHEK GUIDE TEST) test strip USED TO TEST BLOOD SUGARS up to  three IMES DAILY   glucose blood (ACCU-CHEK GUIDE) test strip 1 each by Other route daily in the afternoon. Use as instructed   Glucose Blood (BLOOD GLUCOSE TEST STRIPS) STRP Use 3 (three) times daily as directed to check blood sugar.   insulin  aspart (NOVOLOG  FLEXPEN) 100 UNIT/ML FlexPen Inject 4 Units into the skin 3 (three) times daily with meals. If eating and Blood Glucose (BG) 80 or higher inject 0 units for meal coverage and add correction dose  per scale. If not eating, correction dose only. BG <150= 0 unit; BG 150-200= 1 unit; BG 201-250= 2 unit; BG 251-300= 3 unit; BG 301-350= 4 unit; BG 351-400= 5 unit   Insulin  Glargine (BASAGLAR  KWIKPEN) 100 UNIT/ML Inject 36 Units into the skin daily.   Insulin  Pen Needle 31G X 8 MM MISC 1 Device by Does not apply route in the morning, at noon, in the evening, and at bedtime.   Lancet Device MISC Use 3 (three) times daily. May dispense any manufacturer covered by patient's insurance.   Lancets (ACCU-CHEK MULTICLIX) lancets USE TO CHECK BLOOD SUGARS UP TO 4 TIMES DAILY  DX CODE:E11.9   levonorgestrel  (MIRENA ) 20 MCG/24HR IUD 1 each by Intrauterine route once.   losartan  (COZAAR ) 50 MG tablet Take 1 tablet (50 mg total) by mouth daily.   metFORMIN  (GLUCOPHAGE -XR) 750 MG 24 hr tablet TAKE 2 TABLETS (1,500 MG TOTAL) BY MOUTH EVERY DAY WITH BREAKFAST *NEW PRESCRIPTION REQUEST*   methocarbamol  (ROBAXIN ) 500 MG tablet TAKE 1 TABLET BY MOUTH 3 TIMES DAILY   omeprazole (PRILOSEC) 20 MG capsule Take 20 mg by mouth daily.   ondansetron  (ZOFRAN ) 8 MG tablet TAKE 1 TABLET BY MOUTH EVERY 8 HOURS AS NEEDED FOR NAUSEA OR VOMITING. *NEW PRESCRIPTION REQUEST*   propranolol  (INDERAL ) 10 MG tablet Take 1 tablet (10 mg total) by mouth daily.   sertraline  (ZOLOFT ) 50 MG tablet TAKE 1 & 1/2 TABLETS BY MOUTH ONCE DAILY *NEW PRESCRIPTION REQUEST*   Vitamin D , Ergocalciferol , (DRISDOL ) 1.25 MG (50000 UNIT) CAPS capsule Take 1 capsule (50,000 Units total) by mouth 2 (two) times a week.   No facility-administered encounter medications on file as of 07/13/2024.     Scribe for Treatment Team: Aspen Deterding R Jasim Harari, LCSW

## 2024-07-13 NOTE — Telephone Encounter (Addendum)
 Dr. Sam, Patient will be scheduled as soon as possible.  Auth Submission: PENDING (REQUIRES AUTH) Site of care: Site of care: [facility] Payer: BCBS ANTHEM Medication & CPT/J Code(s) submitted: Reclast (Zolendronic acid) [id number] Diagnosis Code: E83.52 Route of submission (phone, fax, portal):  Phone #[phone] Fax # Auth type: Buy/Bill PB Units/visits requested: X1 DOSE Reference number:  Approval from: 07/13/24 to 07/13/25

## 2024-07-13 NOTE — Telephone Encounter (Addendum)
 Received zoledronic acid referral from Dr. Kellie for hypercalcemia.  Patient does not appear to have history of malignancy  Sherry Pennant, PharmD, MPH, BCPS, CPP Clinical Pharmacist

## 2024-07-14 ENCOUNTER — Ambulatory Visit

## 2024-07-14 ENCOUNTER — Telehealth: Payer: Self-pay | Admitting: *Deleted

## 2024-07-14 DIAGNOSIS — R52 Pain, unspecified: Secondary | ICD-10-CM

## 2024-07-14 DIAGNOSIS — M6281 Muscle weakness (generalized): Secondary | ICD-10-CM | POA: Diagnosis not present
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
## 2024-07-14 NOTE — Telephone Encounter (Signed)
 Received vm message from pt stating her Endocrinologist is trying to reach Dr. Federico regarding pt receiving an infusion for treating her hypercalcemia-to make sure there are no contraindications for this infusion.  Please advise

## 2024-07-15 ENCOUNTER — Other Ambulatory Visit: Payer: Self-pay

## 2024-07-15 NOTE — Telephone Encounter (Signed)
 F/u:  The Reclast has been denied.  The denial letter has been scanned to her media tab for review. Thanks Luke

## 2024-07-15 NOTE — Telephone Encounter (Signed)
 Reclast denied - therapy plan discontinued

## 2024-07-15 NOTE — Addendum Note (Signed)
 Addended by: DAYNE SHERRY RAMAN on: 07/15/2024 11:12 AM   Modules accepted: Orders

## 2024-07-15 NOTE — Patient Outreach (Signed)
 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  I obtained verbal consent from Patient.  Visit completed with Patient on the phone.  Background:   Past Medical History:  Diagnosis Date   Abnormal Pap smear    f/u wnl   Asthma    DM2 (diabetes mellitus, type 2) (HCC)    DM2 (diabetes mellitus, type 2) (HCC) 09/13/2012   AIC 9.7 09-24-12 Referred to Dr. Jarold after consult with Dr. Darcel Appt. Scheduled  C. Armer, CNM, FNP    Fibroid    Gestational diabetes    Hypertension    Morbid obesity (HCC)    Sleep apnea    not on cpap, diagnosed when pregnant   Urinary tract infection     Assessment: Patient Reported Symptoms:  Cognitive Cognitive Status: Alert and oriented to person, place, and time, Normal speech and language skills   Health Maintenance Behaviors: Annual physical exam  Neurological Neurological Review of Symptoms: Dizziness Neurological Management Strategies: Adequate rest, Routine screening Neurological Self-Management Outcome: 4 (good) Neurological Comment: patient states the dizziness has improved, MD aware  HEENT HEENT Symptoms Reported: No symptoms reported      Cardiovascular Cardiovascular Symptoms Reported: Other:, Fatigue Other Cardiovascular Symptoms: tachycardia Does patient have uncontrolled Hypertension?: Yes Is patient checking Blood Pressure at home?: Yes Patient's Recent BP reading at home: BP 167/106 P 102 Cardiovascular Management  Strategies: Adequate rest, Medication therapy, Routine screening Cardiovascular Self-Management Outcome: 3 (uncertain) Cardiovascular Comment: Discussed PCP referral; provided patient with the contact number and instructed her to call to scheduled her new patient appointment  Respiratory Respiratory Symptoms Reported: No symptoms reported    Endocrine Endocrine Symptoms Reported: Fast heartbeat, Weakness or fatigue Is patient diabetic?: Yes Is patient checking blood sugars at home?: No Endocrine Self-Management Outcome: 4 (good)  Gastrointestinal Gastrointestinal Symptoms Reported: Not assessed      Genitourinary Genitourinary Symptoms Reported: Not assessed    Integumentary Integumentary Symptoms Reported: Not assessed    Musculoskeletal Musculoskelatal Symptoms Reviewed: No symptoms reported        Psychosocial Psychosocial Symptoms Reported: No symptoms reported   Major Change/Loss/Stressor/Fears (CP): Medical condition, self Quality of Family Relationships: helpful, involved, supportive Do you feel physically threatened by others?: No    07/15/2024    PHQ2-9 Depression Screening   Leondre Taul interest or pleasure in doing things    Feeling down, depressed, or hopeless    PHQ-2 - Total Score    Trouble falling or staying asleep, or sleeping too much    Feeling tired or having Adhira Jamil energy    Poor appetite or overeating     Feeling bad about yourself - or that you are a failure or have let yourself or your family down    Trouble concentrating on things, such as reading the newspaper or watching television    Moving or speaking so slowly that other people could have noticed.  Or the opposite - being so fidgety or restless that you have been moving around a lot more than usual    Thoughts that you would be better off dead,  or hurting yourself in some way    PHQ2-9 Total Score    If you checked off any problems, how difficult have these problems made it for you to do your work,  take care of things at home, or get along with other people    Depression Interventions/Treatment      There were no vitals filed for this visit. Pain Scale: Not given for pain  Medications Reviewed Today     Reviewed by Morgan Clayborne CROME, RN (Registered Nurse) on 07/15/24 at 1313  Med List Status: <None>   Medication Order Taking? Sig Documenting Provider Last Dose Status Informant  Accu-Chek Softclix Lancets lancets [id number] Yes USED TO TEST BLOOD SUGARS 3-4 TIMES DAILY *NEW PRESCRIPTION REQUESTDEWAINE Ada, Gaines, FNP  Active   albuterol  (VENTOLIN  HFA) 108 (90 Base) MCG/ACT inhaler [id number]  Inhale 1-2 puffs into the lungs every 6 (six) hours as needed for wheezing or shortness of breath. Wieters, Hallie C, [name]  Active Self, Pharmacy Records  atorvastatin  (LIPITOR) 20 MG tablet [id number]  TAKE 1 TABLET BY MOUTH EVERY DAY *NEW PRESCRIPTION REQUESTDEWAINE Ada, Gaines, FNP  Active   Blood Glucose Monitoring Suppl (BLOOD GLUCOSE MONITOR SYSTEM) w/Device KIT [id number]  Use 3 (three) times daily as directed Samtani, Jai-Gurmukh, MD  Active   cetirizine  (ALLERGY RELIEF CETIRIZINE ) 10 MG tablet [id number]  TAKE 1 TABLET BY MOUTH EVERY DAY *NEW PRESCRIPTION REQUESTDEWAINE [name], FNP  Active   Continuous Glucose Sensor (DEXCOM G7 SENSOR) MISC [id number]  1 Device by Does not apply route as directed. Shamleffer, Donell Cardinal, MD  Active   ferrous sulfate  325 (65 FE) MG tablet [id number]  Take 1 tablet (325 mg total) by mouth daily with breakfast. Please take with a source of Vitamin C Dorsey, John T IV, MD  Active   fluticasone  (FLONASE ) 50 MCG/ACT nasal spray [id number]  Place 1-2 sprays into both nostrils daily. Pura Brittle, NP  Active Self, Pharmacy Records  glucose blood (ACCU-CHEK GUIDE TEST) test strip [id number]  USED TO TEST BLOOD SUGARS up to  three IMES DAILY Shamleffer, Ibtehal Jaralla, MD  Active   glucose blood (ACCU-CHEK GUIDE) test strip [id number]  1 each by Other route daily in the  afternoon. Use as instructed Shamleffer, Ibtehal Jaralla, MD  Active Self, Pharmacy Records  Glucose Blood (BLOOD GLUCOSE TEST STRIPS) STRP [id number]  Use 3 (three) times daily as directed to check blood sugar. Samtani, Jai-Gurmukh, MD  Active   insulin  aspart (NOVOLOG  FLEXPEN) 100 UNIT/ML FlexPen [id number]  Inject 4 Units into the skin 3 (three) times daily with meals. If eating and Blood Glucose (BG) 80 or higher inject 0 units for meal coverage and add correction dose per scale. If not eating, correction dose only. BG <150= 0 unit; BG 150-200= 1 unit; BG 201-250= 2 unit; BG 251-300= 3 unit; BG 301-350= 4 unit; BG 351-400= 5 unit Shamleffer, Ibtehal Jaralla, MD  Active   Insulin  Glargine (BASAGLAR  KWIKPEN) 100 UNIT/ML [id number]  Inject 36 Units into the skin daily. Shamleffer, Donell Cardinal, MD  Active   Insulin  Pen Needle 31G X 8 MM MISC [id number]  1 Device by Does not apply route in the morning, at noon, in the evening, and at bedtime. Shamleffer, Donell Cardinal, MD  Active   Lancet Device MISC [id number]  Use 3 (three) times daily. May dispense any manufacturer covered by patient's insurance. Samtani, Jai-Gurmukh, MD  Active   Lancets (ACCU-CHEK MULTICLIX) lancets [id number]  USE TO CHECK BLOOD SUGARS UP TO 4 TIMES DAILY  DX CODE:E11.9 Shamleffer, Donell Cardinal, MD  Active Self, Pharmacy Records  levonorgestrel  (MIRENA ) 20 MCG/24HR IUD [id number]  1 each by Intrauterine route once. [provider]  Active Self, Pharmacy Records           Med Note (CRUTHIS, CHLOE C   Mon Mar 14, 2024  8:21 AM) Still has this inserted.   losartan  (COZAAR ) 50 MG tablet [id number]  Take 1 tablet (50 mg total) by mouth daily. Georgina Speaks, FNP  Active   metFORMIN  (GLUCOPHAGE -XR) 750 MG 24 hr tablet [id number]  TAKE 2 TABLETS (1,500 MG TOTAL) BY MOUTH EVERY DAY WITH BREAKFAST *NEW PRESCRIPTION REQUESTDEWAINE Georgina, Speaks, FNP  Active   methocarbamol  (ROBAXIN ) 500 MG tablet [id number]  TAKE 1 TABLET BY MOUTH 3 TIMES  DAILY Moore, Janece, FNP  Active   omeprazole (PRILOSEC) 20 MG capsule [id number]  Take 20 mg by mouth daily. [provider]  Active Self, Pharmacy Records  ondansetron  (ZOFRAN ) 8 MG tablet [id number]  TAKE 1 TABLET BY MOUTH EVERY 8 HOURS AS NEEDED FOR NAUSEA OR VOMITING. *NEW PRESCRIPTION REQUESTDEWAINE Georgina, Speaks, FNP  Active   propranolol  (INDERAL ) 10 MG tablet [id number]  Take 1 tablet (10 mg total) by mouth daily. Georgina Speaks, FNP  Active   sertraline  (ZOLOFT ) 50 MG tablet [id number]  TAKE 1 & 1/2 TABLETS BY MOUTH ONCE DAILY *NEW PRESCRIPTION REQUESTDEWAINE Georgina, Speaks, FNP  Active   Vitamin D , Ergocalciferol , (DRISDOL ) 1.25 MG (50000 UNIT) CAPS capsule [id number]  Take 1 capsule (50,000 Units total) by mouth 2 (two) times a week. Georgina Speaks, FNP  Active             Recommendation:   Specialty provider follow-up   07/19/2024 Status: Sch   Time: 9:50 AM Length: 20  Visit Type: FOLLOW UP 20 [336] Copay: $0.00  Provider: Stacia Glendia BRAVO, MD Department: [facility]   07/19/2024 Status: Sch   Time: 2:30 PM Length: 30  Visit Type: INTEGRATED BH FOLLOW UP [1646] Copay: $0.00  Provider: Juan Tawni SAUNDERS, LCSW Department: [facility]    07/20/2024 Status: Sch   Time: 10:00 AM Length: 15  Visit Type: [facility] END[facility]/NEURO LAB [2303] Copay: $0.00  Provider: [facility] Department: [facility]   08/02/2024 Status: Sch   Time: 1:15 PM Length: 45  Visit Type: TREATMENT-ORTHO [161] Copay: $0.00  Provider: Rosann Washington Greener, PT Department: [facility]    Follow Up Plan:   Telephone follow up appointment date/time   08/09/2024 Status: Sch   Time: 3:30 PM Length: 30  Visit Type: [facility] TELEPHONE CALL 30 [2502] Copay: $0.00  Provider: Morgan Clayborne CROME, RN Department: [facility]    Clayborne Morgan RN BSN CCM [place]  [facility], [facility] Nurse Care Coordinator  Direct Dial : [phone] Website:  [email or web address]

## 2024-07-15 NOTE — Patient Instructions (Addendum)
 Visit Information  Thank you for taking time to visit with me today. Please don't hesitate to contact me if I can be of assistance to you before our next scheduled appointment.  Your next care management appointment is by telephone on Tuesday, January 6 at 3:30 PM  Please call the care guide team at [phone] if you need to cancel, schedule, or reschedule an appointment.   Please call 1-800-273-TALK (toll free, 24 hour hotline) if you are experiencing a Mental Health or Behavioral Health Crisis or need someone to talk to.  Clayborne Ly RN BSN CCM [place]  [facility], [facility] Nurse Care Coordinator  Direct Dial : [phone] Website: [email or web address]

## 2024-07-17 NOTE — Progress Notes (Unsigned)
 Cardiology Office Note:   Date:  07/21/2024  ID:  [name], DOB [birth year], MRN [id number] PCP:  Georgina Speaks, FNP  [facility] Providers Cardiologist:  Wendel Haws, MD Referring MD: Georgina Speaks, FNP  Chief Complaint/Reason for Referral: Follow-up abnormal EKG ASSESSMENT:    1. Type 2 diabetes mellitus without complication, with long-term current use of insulin  (HCC)   2. Hypertension associated with diabetes (HCC)   3. Hyperlipidemia associated with type 2 diabetes mellitus (HCC)   4. Tachycardia   5. BMI 50.0-59.9, adult (HCC)     PLAN:   In order of problems listed above: T2DM: Start aspirin  81 mg after liver biopsy, continue losartan  with dose adjustment as below, atorvastatin  20 mg.  Defer SGLT2 inhibitor Hypertension: BP above goal.  Increase losartan  100mg .  Obtain echocardiogram to evaluate further. Hyperlipidemia: Continue atorvastatin  20 mg.  LDL recently was 69 in October 2025.  Check  LP(a) next week. Tachycardia:  Monitor was reassuring recently; had labs recently.  Will check echocardiogram.  May be due to hypercalcemia.  Will check reflex TSH.  Tachycardia likely multifactorial due to deconditioning, hypercalcemia, necrotizing fasciitis. Elevated BMI: Refer to pharmacy for recommendations regarding GLP-1 receptor agonist therapy.            Dispo:  Return in about 6 months (around 01/19/2025).       I spent 38 minutes reviewing all clinical data during and prior to this visit including all relevant imaging studies, laboratories, clinical information from other health systems and prior notes from both Cardiology and other specialties, interviewing the patient, conducting a complete physical examination, and coordinating care in order to formulate a comprehensive and personalized evaluation and treatment plan.   History of Present Illness:    FOCUSED PROBLEM LIST:   T2DM On insulin  History of necrotizing fasciitis Not a candiate for  SLGT2i Hypertension Hyperlipidemia Fe deficiency anemia BMI 50  December 2025:  Patient consents to use of AI scribe. The patient returns for follow-up.  She was last seen by Dr. Alvan in 2023 due to an abnormal EKG.  Her EKG at that time demonstrated sinus tachycardia.  She experiences tachycardia and shortness of breath, particularly upon exertion, with difficulty breathing and sometimes skipping breaths. These symptoms have persisted since her hospitalization for necrotizing fasciitis in August, during which she became septic and required multiple surgeries and blood transfusions.  Her history of necrotizing fasciitis led to sepsis and necessitated four surgeries. During her hospitalization, she experienced significant blood loss due to burst blood vessels and received six rounds of iron  supplementation. Since then, she has been experiencing symptoms such as shortness of breath and tachycardia.  She has high calcium  levels, with recent lab results showing a calcium  level of 11.9 mg/dL. She experiences lightheadedness and dizziness, particularly when closing her eyes or in the shower, affecting her balance. She also reports episodes of fast heartbeats coinciding with these symptoms.  She has a history of diabetes and previously took Ozempic  for diabetes management but discontinued it due to low blood sugar levels and concerns following her illness. Her blood pressure has been elevated, with recent readings of 140/80 mmHg, although she notes this is an improvement from previous higher reading       Current Medications: Active Medications[1]   Review of Systems:   Please see the history of present illness.    All other systems reviewed and are negative.     EKGs/Labs/Other Test Reviewed:   EKG:  2025 ST, inferior Q waves  EKG Interpretation Date/Time:    Ventricular Rate:    PR Interval:    QRS Duration:    QT Interval:    QTC Calculation:   R Axis:      Text Interpretation:           CARDIAC STUDIES: Refer to CV Procedures and Imaging Tabs   Risk Assessment/Calculations:          Physical Exam:   VS:  BP 126/88   Pulse (!) 112   Ht 5' 5 (1.651 m)   Wt (!) 306 lb 3.2 oz (138.9 kg)   SpO2 94%   BMI 50.95 kg/m        Wt Readings from Last 3 Encounters:  07/21/24 (!) 306 lb 3.2 oz (138.9 kg)  07/19/24 (!) 307 lb 12.8 oz (139.6 kg)  06/27/24 (!) 306 lb (138.8 kg)      GENERAL:  No apparent distress, AOx3 HEENT:  No carotid bruits, +2 carotid impulses, no scleral icterus CAR: Tachycardic no murmurs, gallops, rubs, or thrills RES:  Clear to auscultation bilaterally ABD:  Soft, nontender, nondistended, positive bowel sounds x 4 VASC:  +2 radial pulses, +2 carotid pulses NEURO:  CN 2-12 grossly intact; motor and sensory grossly intact PSYCH:  No active depression or anxiety EXT:  No edema, ecchymosis, or cyanosis  Signed, Jaleen Grupp K Khamila Bassinger, MD  07/21/2024 9:05 AM    [facility] [address] [place], [place], [place]  [address] Phone: [phone]; Fax: [phone]   Note:  This document was prepared using Dragon voice recognition software and may include unintentional dictation errors.     [1]  Current Meds  Medication Sig   Accu-Chek Softclix Lancets lancets USED TO TEST BLOOD SUGARS 3-4 TIMES DAILY *NEW PRESCRIPTION REQUEST*   albuterol  (VENTOLIN  HFA) 108 (90 Base) MCG/ACT inhaler Inhale 1-2 puffs into the lungs every 6 (six) hours as needed for wheezing or shortness of breath.   aspirin  EC 81 MG tablet Take 1 tablet (81 mg total) by mouth daily. Swallow whole.   atorvastatin  (LIPITOR) 20 MG tablet TAKE 1 TABLET BY MOUTH EVERY DAY *NEW PRESCRIPTION REQUEST*   Blood Glucose Monitoring Suppl (BLOOD GLUCOSE MONITOR SYSTEM) w/Device KIT Use 3 (three) times daily as directed   cetirizine  (ALLERGY RELIEF CETIRIZINE ) 10 MG tablet TAKE 1 TABLET BY MOUTH EVERY DAY *NEW PRESCRIPTION REQUEST*   Continuous Glucose Sensor (DEXCOM  G7 SENSOR) MISC 1 Device by Does not apply route as directed.   ferrous sulfate  325 (65 FE) MG tablet Take 1 tablet (325 mg total) by mouth daily with breakfast. Please take with a source of Vitamin C   fluticasone  (FLONASE ) 50 MCG/ACT nasal spray Place 1-2 sprays into both nostrils daily.   glucose blood (ACCU-CHEK GUIDE TEST) test strip USED TO TEST BLOOD SUGARS up to  three IMES DAILY   insulin  aspart (NOVOLOG  FLEXPEN) 100 UNIT/ML FlexPen Inject 4 Units into the skin 3 (three) times daily with meals. If eating and Blood Glucose (BG) 80 or higher inject 0 units for meal coverage and add correction dose per scale. If not eating, correction dose only. BG <150= 0 unit; BG 150-200= 1 unit; BG 201-250= 2 unit; BG 251-300= 3 unit; BG 301-350= 4 unit; BG 351-400= 5 unit   Insulin  Glargine (BASAGLAR  KWIKPEN) 100 UNIT/ML Inject 36 Units into the skin daily.   Insulin  Pen Needle 31G X 8 MM MISC 1 Device by Does not apply route in the morning, at noon, in the  evening, and at bedtime.   Lancet Device MISC Use 3 (three) times daily. May dispense any manufacturer covered by patient's insurance.   Lancets (ACCU-CHEK MULTICLIX) lancets USE TO CHECK BLOOD SUGARS UP TO 4 TIMES DAILY  DX CODE:E11.9   levonorgestrel  (MIRENA ) 20 MCG/24HR IUD 1 each by Intrauterine route once.   metFORMIN  (GLUCOPHAGE -XR) 750 MG 24 hr tablet TAKE 2 TABLETS (1,500 MG TOTAL) BY MOUTH EVERY DAY WITH BREAKFAST *NEW PRESCRIPTION REQUEST*   methocarbamol  (ROBAXIN ) 500 MG tablet TAKE 1 TABLET BY MOUTH 3 TIMES DAILY   omeprazole (PRILOSEC) 20 MG capsule Take 20 mg by mouth daily.   ondansetron  (ZOFRAN ) 8 MG tablet TAKE 1 TABLET BY MOUTH EVERY 8 HOURS AS NEEDED FOR NAUSEA OR VOMITING. *NEW PRESCRIPTION REQUEST*   propranolol  (INDERAL ) 10 MG tablet Take 1 tablet (10 mg total) by mouth 2 (two) times daily.   sertraline  (ZOLOFT ) 100 MG tablet TAKE 1 TABLETS BY MOUTH ONCE DAILY   Vitamin D , Ergocalciferol , (DRISDOL ) 1.25 MG (50000 UNIT) CAPS capsule  Take 1 capsule (50,000 Units total) by mouth 2 (two) times a week.   [DISCONTINUED] glucose blood (ACCU-CHEK GUIDE) test strip 1 each by Other route daily in the afternoon. Use as instructed   [DISCONTINUED] Glucose Blood (BLOOD GLUCOSE TEST STRIPS) STRP Use 3 (three) times daily as directed to check blood sugar.   [DISCONTINUED] losartan  (COZAAR ) 50 MG tablet Take 1 tablet (50 mg total) by mouth daily.

## 2024-07-18 DIAGNOSIS — R000 Tachycardia, unspecified: Secondary | ICD-10-CM

## 2024-07-19 ENCOUNTER — Ambulatory Visit: Admitting: Gastroenterology

## 2024-07-19 ENCOUNTER — Encounter: Payer: Self-pay | Admitting: Gastroenterology

## 2024-07-19 ENCOUNTER — Ambulatory Visit: Admitting: Physical Therapy

## 2024-07-19 ENCOUNTER — Ambulatory Visit: Admitting: Licensed Clinical Social Worker

## 2024-07-19 VITALS — BP 136/90 | HR 92 | Ht 65.0 in | Wt 307.8 lb

## 2024-07-19 DIAGNOSIS — K7460 Unspecified cirrhosis of liver: Secondary | ICD-10-CM

## 2024-07-19 DIAGNOSIS — K760 Fatty (change of) liver, not elsewhere classified: Secondary | ICD-10-CM | POA: Diagnosis not present

## 2024-07-19 DIAGNOSIS — D75839 Thrombocytosis, unspecified: Secondary | ICD-10-CM

## 2024-07-19 DIAGNOSIS — R748 Abnormal levels of other serum enzymes: Secondary | ICD-10-CM | POA: Diagnosis not present

## 2024-07-19 DIAGNOSIS — D509 Iron deficiency anemia, unspecified: Secondary | ICD-10-CM

## 2024-07-19 NOTE — BH Specialist Note (Signed)
 [facility] Clinician attempted to connect with patient for scheduled virtual appointment via Caregility. Patient was sent link via text and email.  Pt did not connect in virtual room--after 15 minutes, clinician closed out Caregility virtual room.   Clinician attempted to connect w/ patient via phone--clinician left HIPAA compliant voice mail to call office to reschedule appt.    Clinician spent total of 15 minutes attempting to connect with patient for scheduled visit.

## 2024-07-19 NOTE — Patient Instructions (Signed)
 We will contact you once we have your liver biopsy scheduled.   _______________________________________________________  If your blood pressure at your visit was 140/90 or greater, please contact your primary care physician to follow up on this.  _______________________________________________________  If you are age 42 or older, your body mass index should be between 23-30. Your Body mass index is 51.22 kg/m. If this is out of the aforementioned range listed, please consider follow up with your Primary Care Provider.  If you are age 76 or younger, your body mass index should be between 19-25. Your Body mass index is 51.22 kg/m. If this is out of the aformentioned range listed, please consider follow up with your Primary Care Provider.   ________________________________________________________  The [facility] providers would like to encourage you to use MYCHART to communicate with providers for non-urgent requests or questions.  Due to long hold times on the telephone, sending your provider a message by [facility] may be a faster and more efficient way to get a response.  Please allow 48 business hours for a response.  Please remember that this is for non-urgent requests.  _______________________________________________________  [facility] is using a team-based approach to care.  Your team is made up of your doctor and two to three APPS. Our APPS (Nurse Practitioners and Physician Assistants) work with your physician to ensure care continuity for you. They are fully qualified to address your health concerns and develop a treatment plan. They communicate directly with your gastroenterologist to care for you. Seeing the Advanced Practice Practitioners on your physician's team can help you by facilitating care more promptly, often allowing for earlier appointments, access to diagnostic testing, procedures, and other specialty referrals.

## 2024-07-19 NOTE — Progress Notes (Unsigned)
 HPI :         03/30/2024 abdominal liver CT with and without contrast: Cirrhotic liver morphology without suspicious liver observation. Previously seen liver lesion on MRI is not conspicuous on current CT. CT has less sensitivity for evaluation of liver lesions. Recommend future follow-ups with dedicated MRI liver protocol. Splenomegaly and stigmata of portal hypertension. Right kidney nonobstructive nephrolithiasis. Multiple retroperitoneal subcentimeter and pericentimeter lymph nodes.   03/15/2024 MRI liver with and without contrast: 1. Diffusely nodular contour of the liver compatible with cirrhosis. No arterial phase enhancing liver lesions identified to suggest HCC. Recommend correlation with alpha-fetoprotein levels. Given the limitations of the current exam due to motion artifact and patient body habitus, if there is a high clinical concern for underlying hepatoma recommend repeat imaging with dedicated liver protocol CT. 2. 1 cm subcapsular T2 hyperintense lesion in the periphery of segment 7, without postcontrast enhancement. Indeterminate. Consider follow up imaging in 3 to 6 months. 3. Mild splenomegaly with signs of diffuse iron  deposition.   MRI liver Nov 2025 IMPRESSION: 1. Cirrhotic liver without focal lesion. No ascites. No splenomegaly. 2. Multiple other nonacute observations, as described above  Ms. Virgo was hospitalized for large labial abscess 03/2024 and septic shock likely multifactorial in the setting of sepsis due to large abscess with noted necrotizing fasciitis. CT abdomen and pelvis was obtained that showed cirrhosis with portal hypertension. In addition innumerable underlying masses suggestive of hepatocellular carcinoma. MRI of the liver was obtained on 03/15/2024 findings consistent with cirrhosis and no arterial phase enhancing liver lesions. There was 1 cm subcapsular T2 lesion int he peripheral of segment 7. The MRI was limited due to motion  artifact and patient body habitus  Past Medical History:  Diagnosis Date   Abnormal Pap smear    f/u wnl   Asthma    DM2 (diabetes mellitus, type 2) (HCC)    DM2 (diabetes mellitus, type 2) (HCC) 09/13/2012   AIC 9.7 09-24-12 Referred to Dr. Jarold after consult with Dr. Darcel Appt. Scheduled  C. Armer, CNM, FNP    Fibroid    Gestational diabetes    Hypertension    Morbid obesity (HCC)    Sleep apnea    not on cpap, diagnosed when pregnant   Urinary tract infection      Past Surgical History:  Procedure Laterality Date   BREAST BIOPSY Right 07/24/2022   US  RT BREAST BX W LOC DEV 1ST LESION IMG BX SPEC US  GUIDE 07/24/2022 [facility] MAMMOGRAPHY   CESAREAN SECTION     DILATION AND CURETTAGE OF UTERUS     INCISION AND DRAINAGE OF WOUND N/A 03/13/2024   Procedure: IRRIGATION AND DEBRIDEMENT WOUND;  Surgeon: Polly Cordella LABOR, MD;  Location: [facility] OR;  Service: General;  Laterality: N/A;   INCISION AND DRAINAGE PERIRECTAL ABSCESS N/A 03/16/2024   Procedure: INCISION AND DRAINAGE, ABSCESS, PERIRECTAL;  Surgeon: Lyndel Deward PARAS, MD;  Location: [facility] OR;  Service: General;  Laterality: N/A;   IRRIGATION AND DEBRIDEMENT OF NECROTIZING SOFT TISSUE INFECTION Left 03/18/2024   Procedure: EXAM UNDER ANESTHESIA; INCISION, DRAINAGE, AND DEBRIDEMENT OF NECROTIZING SOFT TISSUE INFECTION LEFT LABIA AND THIGH;  Surgeon: Stechschulte, Deward PARAS, MD;  Location: [facility] OR;  Service: General;  Laterality: Left;   RECTAL EXAM UNDER ANESTHESIA N/A 03/13/2024   Procedure: EXAM UNDER ANESTHESIA,;  Surgeon: Polly Cordella LABOR, MD;  Location: [facility] OR;  Service: General;  Laterality: N/A;  I AND D left labia necrotizing infection   TUBAL LIGATION  WOUND EXPLORATION Left 03/19/2024   Procedure: EXPLORATION OF LEFT GROIN WOUND;  Surgeon: Dasie Leonor CROME, MD;  Location: [facility] OR;  Service: General;  Laterality: Left;  LEFT GROIN WOUND EXPLORATION   Family History  Problem Relation Age of Onset   Allergies Mother     Asthma Mother    Hypertension Mother    Diabetes Mother    Diabetes Father    Bladder Cancer Maternal Uncle    Diabetes Maternal Grandmother    Heart disease Maternal Grandmother    Liver cancer Other    Liver cancer Other    Anesthesia problems Neg Hx    Hypotension Neg Hx    Malignant hyperthermia Neg Hx    Pseudochol deficiency Neg Hx    Hearing loss Neg Hx    Breast cancer Neg Hx    Social History[1] Current Outpatient Medications  Medication Sig Dispense Refill   Accu-Chek Softclix Lancets lancets USED TO TEST BLOOD SUGARS 3-4 TIMES DAILY *NEW PRESCRIPTION REQUEST* 400 each 11   albuterol  (VENTOLIN  HFA) 108 (90 Base) MCG/ACT inhaler Inhale 1-2 puffs into the lungs every 6 (six) hours as needed for wheezing or shortness of breath. 18 g 0   atorvastatin  (LIPITOR) 20 MG tablet TAKE 1 TABLET BY MOUTH EVERY DAY *NEW PRESCRIPTION REQUEST* 90 tablet 2   Blood Glucose Monitoring Suppl (BLOOD GLUCOSE MONITOR SYSTEM) w/Device KIT Use 3 (three) times daily as directed 1 kit 0   cetirizine  (ALLERGY RELIEF CETIRIZINE ) 10 MG tablet TAKE 1 TABLET BY MOUTH EVERY DAY *NEW PRESCRIPTION REQUEST* 90 tablet 2   Continuous Glucose Sensor (DEXCOM G7 SENSOR) MISC 1 Device by Does not apply route as directed. 9 each 3   ferrous sulfate  325 (65 FE) MG tablet Take 1 tablet (325 mg total) by mouth daily with breakfast. Please take with a source of Vitamin C 90 tablet 3   fluticasone  (FLONASE ) 50 MCG/ACT nasal spray Place 1-2 sprays into both nostrils daily. 16 g 0   glucose blood (ACCU-CHEK GUIDE TEST) test strip USED TO TEST BLOOD SUGARS up to  three IMES DAILY 400 strip 11   glucose blood (ACCU-CHEK GUIDE) test strip 1 each by Other route daily in the afternoon. Use as instructed 100 each 3   Glucose Blood (BLOOD GLUCOSE TEST STRIPS) STRP Use 3 (three) times daily as directed to check blood sugar. 100 strip 0   insulin  aspart (NOVOLOG  FLEXPEN) 100 UNIT/ML FlexPen Inject 4 Units into the skin 3 (three) times  daily with meals. If eating and Blood Glucose (BG) 80 or higher inject 0 units for meal coverage and add correction dose per scale. If not eating, correction dose only. BG <150= 0 unit; BG 150-200= 1 unit; BG 201-250= 2 unit; BG 251-300= 3 unit; BG 301-350= 4 unit; BG 351-400= 5 unit 30 mL 3   Insulin  Glargine (BASAGLAR  KWIKPEN) 100 UNIT/ML Inject 36 Units into the skin daily. 45 mL 3   Insulin  Pen Needle 31G X 8 MM MISC 1 Device by Does not apply route in the morning, at noon, in the evening, and at bedtime. 400 each 3   Lancet Device MISC Use 3 (three) times daily. May dispense any manufacturer covered by patient's insurance. 1 each 0   Lancets (ACCU-CHEK MULTICLIX) lancets USE TO CHECK BLOOD SUGARS UP TO 4 TIMES DAILY  DX CODE:E11.9 100 each 12   levonorgestrel  (MIRENA ) 20 MCG/24HR IUD 1 each by Intrauterine route once.     losartan  (COZAAR ) 50 MG tablet Take 1  tablet (50 mg total) by mouth daily. 90 tablet 1   metFORMIN  (GLUCOPHAGE -XR) 750 MG 24 hr tablet TAKE 2 TABLETS (1,500 MG TOTAL) BY MOUTH EVERY DAY WITH BREAKFAST *NEW PRESCRIPTION REQUEST* 180 tablet 2   methocarbamol  (ROBAXIN ) 500 MG tablet TAKE 1 TABLET BY MOUTH 3 TIMES DAILY 270 tablet 11   omeprazole (PRILOSEC) 20 MG capsule Take 20 mg by mouth daily.     ondansetron  (ZOFRAN ) 8 MG tablet TAKE 1 TABLET BY MOUTH EVERY 8 HOURS AS NEEDED FOR NAUSEA OR VOMITING. *NEW PRESCRIPTION REQUEST* 90 tablet 0   propranolol  (INDERAL ) 10 MG tablet Take 1 tablet (10 mg total) by mouth daily. 90 tablet 1   sertraline  (ZOLOFT ) 50 MG tablet TAKE 1 & 1/2 TABLETS BY MOUTH ONCE DAILY *NEW PRESCRIPTION REQUEST* 135 tablet 0   Vitamin D , Ergocalciferol , (DRISDOL ) 1.25 MG (50000 UNIT) CAPS capsule Take 1 capsule (50,000 Units total) by mouth 2 (two) times a week. 24 capsule 1   No current facility-administered medications for this visit.   Allergies[2]   Review of Systems: All systems reviewed and negative except where noted in HPI.    LONG TERM  MONITOR (3-14 DAYS) Result Date: 07/18/2024 Zio monitor Result date: 06/18/24-07/02/24 HR 59-211, avg 97 bpm. NSR with 1' AVB. Brief NSVT (longest 5 beats). PACs < 1%. PVCs < 1%.     Physical Exam: There were no vitals taken for this visit. Constitutional: Pleasant,well-developed, ***female in no acute distress. HEENT: Normocephalic and atraumatic. Conjunctivae are normal. No scleral icterus. Neck supple.  Cardiovascular: Normal rate, regular rhythm.  Pulmonary/chest: Effort normal and breath sounds normal. No wheezing, rales or rhonchi. Abdominal: Soft, nondistended, nontender. Bowel sounds active throughout. There are no masses palpable. No hepatomegaly. Extremities: no edema Lymphadenopathy: No cervical adenopathy noted. Neurological: Alert and oriented to person place and time. Skin: Skin is warm and dry. No rashes noted. Psychiatric: Normal mood and affect. Behavior is normal.  CBC    Component Value Date/Time   WBC 7.3 06/20/2024 0852   WBC 13.4 (H) 03/22/2024 1034   RBC 5.30 (H) 06/20/2024 0853   RBC 5.32 (H) 06/20/2024 0852   HGB 13.2 06/20/2024 0852   HGB 11.8 05/05/2024 1102   HCT 42.9 06/20/2024 0852   HCT 38.7 05/05/2024 1102   PLT 520 (H) 06/20/2024 0852   PLT 612 (H) 05/05/2024 1102   MCV 80.6 06/20/2024 0852   MCV 82 05/05/2024 1102   MCH 24.8 (L) 06/20/2024 0852   MCHC 30.8 06/20/2024 0852   RDW 19.9 (H) 06/20/2024 0852   RDW 17.2 (H) 05/05/2024 1102   LYMPHSABS 0.7 06/20/2024 0852   LYMPHSABS 1.0 05/05/2024 1102   MONOABS 1.0 06/20/2024 0852   EOSABS 0.5 06/20/2024 0852   EOSABS 0.4 05/05/2024 1102   BASOSABS 0.1 06/20/2024 0852   BASOSABS 0.1 05/05/2024 1102    CMP     Component Value Date/Time   NA 137 07/06/2024 0935   NA 136 05/05/2024 1102   K 4.8 07/06/2024 0935   CL 100 07/06/2024 0935   CO2 25 07/06/2024 0935   GLUCOSE 119 (H) 07/06/2024 0935   BUN 15 07/06/2024 0935   BUN 11 05/05/2024 1102   CREATININE 0.63 07/06/2024 0935    CALCIUM  11.9 (H) 07/06/2024 0935   CALCIUM  10.4 (H) 03/15/2024 1137   PROT 8.4 (H) 06/20/2024 0852   PROT 7.9 05/05/2024 1102   ALBUMIN  3.4 (L) 06/20/2024 0852   ALBUMIN  3.2 (L) 05/05/2024 1102   AST 72 (H) 06/20/2024 9147  ALT 74 (H) 06/20/2024 0852   ALKPHOS 531 (H) 06/20/2024 0852   BILITOT 1.3 (H) 06/20/2024 0852   GFRNONAA >60 06/20/2024 0852   GFRAA 108 08/28/2020 1735       Latest Ref Rng & Units 06/20/2024    8:53 AM 06/20/2024    8:52 AM 05/05/2024   11:02 AM  CBC EXTENDED  WBC 4.0 - 10.5 K/uL  7.3  9.9   RBC 3.87 - 5.11 MIL/uL 5.30  5.32  4.70   Hemoglobin 12.0 - 15.0 g/dL  86.7  88.1   HCT 63.9 - 46.0 %  42.9  38.7   Platelets 150 - 400 K/uL  520  612   NEUT# 1.7 - 7.7 K/uL  5.1  7.1   Lymph# 0.7 - 4.0 K/uL  0.7  1.0       ASSESSMENT AND PLAN:  Georgina Speaks, FNP     [1]  Social History Tobacco Use   Smoking status: Former    Current packs/day: 0.00    Types: Cigarettes    Quit date: 08/08/2019    Years since quitting: 4.9   Smokeless tobacco: Former  Building Services Engineer status: Never Used  Substance Use Topics   Alcohol use: No    Comment: occasional   Drug use: No  [2]  Allergies Allergen Reactions   Farxiga  [Dapagliflozin ] Other (See Comments)    necrotizing fascititis   Fish Allergy Anaphylaxis   Other Anaphylaxis and Other (See Comments)    Pt has fish allergy Pt has allergy to butter cookies   Penicillins Rash and Other (See Comments)    Told MD that PCN reaction is rash and that she has taken amoxicillin  before. Tolerated Cefepime  X1 during 8/25 admission.    Bactrim  [Sulfamethoxazole -Trimethoprim ] Diarrhea and Rash    Abdominal cramps

## 2024-07-20 ENCOUNTER — Other Ambulatory Visit: Payer: Self-pay

## 2024-07-20 ENCOUNTER — Other Ambulatory Visit

## 2024-07-20 ENCOUNTER — Other Ambulatory Visit: Payer: Self-pay | Admitting: Nurse Practitioner

## 2024-07-20 DIAGNOSIS — F418 Other specified anxiety disorders: Secondary | ICD-10-CM

## 2024-07-20 DIAGNOSIS — R000 Tachycardia, unspecified: Secondary | ICD-10-CM

## 2024-07-20 DIAGNOSIS — R6889 Other general symptoms and signs: Secondary | ICD-10-CM

## 2024-07-20 DIAGNOSIS — E119 Type 2 diabetes mellitus without complications: Secondary | ICD-10-CM

## 2024-07-20 MED ORDER — PROPRANOLOL HCL 10 MG PO TABS: 10.0000 mg | ORAL_TABLET | Freq: Two times a day (BID) | ORAL | 1 refills | Status: DC

## 2024-07-20 MED ORDER — SERTRALINE HCL 100 MG PO TABS: ORAL_TABLET | ORAL | 1 refills | Status: DC

## 2024-07-21 ENCOUNTER — Encounter: Payer: Self-pay | Admitting: Internal Medicine

## 2024-07-21 ENCOUNTER — Ambulatory Visit: Attending: Internal Medicine | Admitting: Internal Medicine

## 2024-07-21 VITALS — BP 126/88 | HR 112 | Ht 65.0 in | Wt 306.2 lb

## 2024-07-21 DIAGNOSIS — E119 Type 2 diabetes mellitus without complications: Secondary | ICD-10-CM

## 2024-07-21 DIAGNOSIS — R000 Tachycardia, unspecified: Secondary | ICD-10-CM | POA: Diagnosis not present

## 2024-07-21 DIAGNOSIS — I152 Hypertension secondary to endocrine disorders: Secondary | ICD-10-CM | POA: Diagnosis not present

## 2024-07-21 DIAGNOSIS — Z6841 Body Mass Index (BMI) 40.0 and over, adult: Secondary | ICD-10-CM

## 2024-07-21 DIAGNOSIS — Z794 Long term (current) use of insulin: Secondary | ICD-10-CM | POA: Diagnosis not present

## 2024-07-21 DIAGNOSIS — E1169 Type 2 diabetes mellitus with other specified complication: Secondary | ICD-10-CM | POA: Diagnosis not present

## 2024-07-21 DIAGNOSIS — R9431 Abnormal electrocardiogram [ECG] [EKG]: Secondary | ICD-10-CM

## 2024-07-21 DIAGNOSIS — E1159 Type 2 diabetes mellitus with other circulatory complications: Secondary | ICD-10-CM

## 2024-07-21 DIAGNOSIS — E785 Hyperlipidemia, unspecified: Secondary | ICD-10-CM | POA: Diagnosis not present

## 2024-07-21 MED ORDER — ASPIRIN 81 MG PO TBEC: 81.0000 mg | DELAYED_RELEASE_TABLET | Freq: Every day | ORAL | Status: AC

## 2024-07-21 MED ORDER — LOSARTAN POTASSIUM 100 MG PO TABS: 100.0000 mg | ORAL_TABLET | Freq: Every day | ORAL | 3 refills | Status: AC

## 2024-07-21 NOTE — Patient Instructions (Signed)
 Medication Instructions:  INCREASE Losartan  to 100 mg once daily   START Aspirin  81 mg AFTER your biopsy is completed.  *If you need a refill on your cardiac medications before your next appointment, please call your pharmacy*  Lab Work: To be completed in 1 week: BMP, reflux TSH, and lipoprotein-a  If you have labs (blood work) drawn today and your tests are completely normal, you will receive your results only by: MyChart Message (if you have MyChart) OR A paper copy in the mail If you have any lab test that is abnormal or we need to change your treatment, we will call you to review the results.  Testing/Procedures: Your physician has requested that you have an echocardiogram. Echocardiography is a painless test that uses sound waves to create images of your heart. It provides your doctor with information about the size and shape of your heart and how well your hearts chambers and valves are working. This procedure takes approximately one hour. There are no restrictions for this procedure. Please do NOT wear cologne, perfume, aftershave, or lotions (deodorant is allowed). Please arrive 15 minutes prior to your appointment time.  Please note: We ask at that you not bring children with you during ultrasound (echo/ vascular) testing. Due to room size and safety concerns, children are not allowed in the ultrasound rooms during exams. Our front office staff cannot provide observation of children in our lobby area while testing is being conducted. An adult accompanying a patient to their appointment will only be allowed in the ultrasound room at the discretion of the ultrasound technician under special circumstances. We apologize for any inconvenience.   Follow-Up: At [facility], you and your health needs are our priority.  As part of our continuing mission to provide you with exceptional heart care, our providers are all part of one team.  This team includes your primary Cardiologist  (physician) and Advanced Practice Providers or APPs (Physician Assistants and Nurse Practitioners) who all work together to provide you with the care you need, when you need it.  Your next appointment:   6 month(s)  Provider:   Glendia Ferrier, [name]    We recommend signing up for the patient portal called MyChart.  Sign up information is provided on this After Visit Summary.  MyChart is used to connect with patients for Virtual Visits (Telemedicine).  Patients are able to view lab/test results, encounter notes, upcoming appointments, etc.  Non-urgent messages can be sent to your provider as well.   To learn more about what you can do with MyChart, go to [email or web address].   Other Instructions You have been referred to our PharmD clinic to meet with our pharmacy team. Someone from our office will be calling you to schedule this appointment in the near future.

## 2024-07-22 LAB — PROTEIN,TOTAL AND PROTEIN ELECTROPHORESIS, RANDOM URINE(REFL)
Albumin: 46 %
Alpha-1-Globulin, U: 6 %
Alpha-2-Globulin, U: 12 %
Beta Globulin, U: 14 %
Creatinine, Urine: 94 mg/dL (ref 20–275)
Gamma Globulin, U: 21 %
Protein/Creat Ratio: 319 mg/g{creat} — ABNORMAL HIGH (ref 24–184)
Protein/Creatinine Ratio: 0.319 mg/mg{creat} — ABNORMAL HIGH (ref 0.024–0.184)
Total Protein, Urine: 30 mg/dL — ABNORMAL HIGH (ref 5–24)

## 2024-07-25 ENCOUNTER — Encounter: Payer: Self-pay | Admitting: Pharmacist Clinician (PhC)/ Clinical Pharmacy Specialist

## 2024-07-25 ENCOUNTER — Other Ambulatory Visit (HOSPITAL_COMMUNITY): Payer: Self-pay

## 2024-07-25 ENCOUNTER — Ambulatory Visit: Attending: Cardiology | Admitting: Pharmacist Clinician (PhC)/ Clinical Pharmacy Specialist

## 2024-07-25 ENCOUNTER — Telehealth: Payer: Self-pay | Admitting: Pharmacist Clinician (PhC)/ Clinical Pharmacy Specialist

## 2024-07-25 VITALS — Ht 65.0 in | Wt 300.8 lb

## 2024-07-25 DIAGNOSIS — Z6841 Body Mass Index (BMI) 40.0 and over, adult: Secondary | ICD-10-CM | POA: Diagnosis not present

## 2024-07-25 LAB — CATECHOLAMINES, FRACTIONATED, PLASMA
Dopamine: 20 pg/mL
Epinephrine: 36 pg/mL
Norepinephrine: 204 pg/mL
Total Catecholamines: 260 pg/mL

## 2024-07-25 LAB — IFE INTERPRETATION

## 2024-07-25 LAB — PROTEIN ELECTROPHORESIS, SERUM, WITH REFLEX
Albumin ELP: 2.9 g/dL — ABNORMAL LOW (ref 3.8–4.8)
Alpha 1: 0.5 g/dL — ABNORMAL HIGH (ref 0.2–0.3)
Alpha 2: 1 g/dL — ABNORMAL HIGH (ref 0.5–0.9)
Beta 2: 0.7 g/dL — ABNORMAL HIGH (ref 0.2–0.5)
Beta Globulin: 0.5 g/dL (ref 0.4–0.6)
Gamma Globulin: 2.2 g/dL — ABNORMAL HIGH (ref 0.8–1.7)
Total Protein: 7.8 g/dL (ref 6.1–8.1)

## 2024-07-25 LAB — VITAMIN D 1,25 DIHYDROXY
Vitamin D 1, 25 (OH)2 Total: 148 pg/mL — ABNORMAL HIGH (ref 18–72)
Vitamin D2 1, 25 (OH)2: 27 pg/mL
Vitamin D3 1, 25 (OH)2: 121 pg/mL

## 2024-07-25 NOTE — Assessment & Plan Note (Signed)
" °  Patient has not met goal of at least 5% of body weight loss with comprehensive lifestyle modifications alone in the past 3-6 months. Pharmacotherapy is appropriate to pursue as augmentation. Will start Ozempic  or Mounjaro  (depending on coverage).  Patient previously did well with Ozempic , but lost coverage   Confirmed patient not pregnant and no personal or family history of medullary thyroid carcinoma (MTC) or Multiple Endocrine Neoplasia syndrome type 2 (MEN 2).   Advised patient on common side effects including nausea, diarrhea, dyspepsia, decreased appetite, and fatigue. Counseled patient on reducing meal size and how to titrate medication to minimize side effects. Patient aware to call if intolerable side effects or if experiencing dehydration, abdominal pain, or dizziness. Patient will adhere to dietary modifications and will target at least 150 minutes of moderate intensity exercise weekly, plus resistance training twice a week (as recommended by the American Heart Association). This resistance training--such as weightlifting, bodyweight exercises, or using resistance bands, adapted to the patient's ability--will help prevent muscle loss.  Injection technique reviewed at today's visit.    Follow up in 1-2 days regarding coverage of Ozempic  or Mounjaro  . If therapy is initiated, phone or MyChart follow-ups will be conducted every 4 weeks for dose titration until the patient reaches the effective therapeutic dose and target weight. "

## 2024-07-25 NOTE — Patient Instructions (Addendum)
 We will start the prior authorization process to get Mounjaro  or Ozempic  covered by your insurance.   TIPS FOR SUCCESS Write down the reasons why you want to lose weight and post it in a place where you'll see it often. Start small and work your way up. Keep in mind that it takes time to achieve goals, and small steps add up. Any additional movements help to burn calories. Taking the stairs rather than the elevator and parking at the far end of your parking lot are easy ways to start. Brisk walking for at least 30 minutes 4 or more days of the week is an excellent goal to work toward  [place] [employer] WHAT IT MEANS TO FEEL FULL Did you know that it can take 15 minutes or more for your brain to receive the message that you've eaten? That means that, if you eat less food, but consume it slower, you may still feel satisfied. Eating a lot of fruits and vegetables can also help you feel fuller. Eat off of smaller plates so that moderate portions don't seem too small  TITRATION PLAN Will plan to follow the titration plan as below, pending patient is tolerating each dose before increasing to the next. Can slow titration if needed for tolerability.     Follow up via MyChart after you take the 4th dose.  If you have any questions or concerns, please reach out to us .  Hubert Raatz at [phone] or MyChart (best option).  THANK YOU FOR CHOOSING [facility]

## 2024-07-25 NOTE — Telephone Encounter (Signed)
 Pharmacy Patient Advocate Encounter   Received notification from Physician's Office that prior authorization for OZEMPIC  is required/requested.   Insurance verification completed.   The patient is insured through [facility] (PRIMARY PAYER) (MEDICAID IS SECONDARY PAYER; WON'T PICK UP TILL PRIMARY DENIES) SUBMITTING TO PRIMARY PAYER FIRST [facility])   Per test claim: PA required; PA submitted to above mentioned insurance via Latent Key/confirmation #/EOC [id number] Status is pending

## 2024-07-25 NOTE — Progress Notes (Signed)
 "    Office Visit    Patient Name: [name] Date of Encounter: 07/25/2024  Primary Care Provider:  Georgina Speaks, FNP Primary Cardiologist:  None  Chief Complaint    Weight management  Significant Past Medical History   DM2 10/25 A1c 6.3 (down from 8.8 (8/25); on Novolin, basaglar , metformin   HTN Above goal at last visit, losartan  increased to 100 mg daily  HLD  10/25 LDL 69, scheduled for Lp(a)  Tachycardia On exertion, likely multifactorial - deconditioning, hypercalcemia, necrotizing fasciitis       Allergies[1]  History of Present Illness    [name] is a 43 y.o. female patient of Dr Wendel, in the office today to discuss options for weight management.   She was diagnosed with gestational diabetes in 2012, did not resolve after delivery.  Per endocrinology her A1c has ranged from 7.1 to 11.1.  She has tried both Ozempic  and Mounjaro , but was limited by costs.  She notes that she did well with Ozempic .  Unfortunately she has had a challenging year health wise and was hospitalized in August for 10 days with sepsis.  This included blood transfusion while hospitalized and iron  infusions after discharge.  She is currently working with PT trying to build strength back.  Says doing better with legs, but still feels rather weak in upper body.   She notes her weight has fluctuated over the years, peaking over 400 when pregnant, and as low as 289.    Current weight management medications: none  Previously tried meds: Ozempic  - some success, Mounjaro  - cost prohibitive  Current meds that may affect weight: none  Baseline weight/BMI: 136.4 kg // 50.06  Insurance payor:  [employer] commercial/Medicaid  Diet: may only eat once daily - breakfast in the morning with morning meds;(often pancakes and bacon); was doing protein shakes for lunch, but states had to stop due to calcium  concerns;  dinner is beef, pork, chicken, (allergic to fish) vegetables fresh/frozen or canned,  some fresh frutis, crackers (orange peanutb butter if blood sugar falls)  Exercise: PT at [facility] - strengthening after septic episode; , following kids - youth group, band  Confirmed patient not pregnant and no personal or family history of medullary thyroid carcinoma (MTC) or Multiple Endocrine Neoplasia syndrome type 2 (MEN 2).   Social History:   Tobacco: no  Alcohol:no  Caffeine: zero sugar [place] Dry; occaional caffeine if dragging   Accessory Clinical Findings    Lab Results  Component Value Date   CREATININE 0.63 07/06/2024   BUN 15 07/06/2024   NA 137 07/06/2024   K 4.8 07/06/2024   CL 100 07/06/2024   CO2 25 07/06/2024   Lab Results  Component Value Date   ALT 74 (H) 06/20/2024   AST 72 (H) 06/20/2024   ALKPHOS 531 (H) 06/20/2024   BILITOT 1.3 (H) 06/20/2024   Lab Results  Component Value Date   HGBA1C 6.3 (H) 05/05/2024      Home Medications/Allergies    Current Outpatient Medications  Medication Sig Dispense Refill   Accu-Chek Softclix Lancets lancets USED TO TEST BLOOD SUGARS 3-4 TIMES DAILY *NEW PRESCRIPTION REQUEST* 400 each 11   albuterol  (VENTOLIN  HFA) 108 (90 Base) MCG/ACT inhaler Inhale 1-2 puffs into the lungs every 6 (six) hours as needed for wheezing or shortness of breath. 18 g 0   aspirin  EC 81 MG tablet Take 1 tablet (81 mg total) by mouth daily. Swallow whole.     atorvastatin  (LIPITOR) 20 MG  tablet TAKE 1 TABLET BY MOUTH EVERY DAY *NEW PRESCRIPTION REQUEST* 90 tablet 2   Blood Glucose Monitoring Suppl (BLOOD GLUCOSE MONITOR SYSTEM) w/Device KIT Use 3 (three) times daily as directed 1 kit 0   cetirizine  (ALLERGY RELIEF CETIRIZINE ) 10 MG tablet TAKE 1 TABLET BY MOUTH EVERY DAY *NEW PRESCRIPTION REQUEST* 90 tablet 2   Continuous Glucose Sensor (DEXCOM G7 SENSOR) MISC 1 Device by Does not apply route as directed. 9 each 3   ferrous sulfate  325 (65 FE) MG tablet Take 1 tablet (325 mg total) by mouth daily with breakfast. Please take with a source of  Vitamin C 90 tablet 3   fluticasone  (FLONASE ) 50 MCG/ACT nasal spray Place 1-2 sprays into both nostrils daily. 16 g 0   glucose blood (ACCU-CHEK GUIDE TEST) test strip USED TO TEST BLOOD SUGARS up to  three IMES DAILY 400 strip 11   insulin  aspart (NOVOLOG  FLEXPEN) 100 UNIT/ML FlexPen Inject 4 Units into the skin 3 (three) times daily with meals. If eating and Blood Glucose (BG) 80 or higher inject 0 units for meal coverage and add correction dose per scale. If not eating, correction dose only. BG <150= 0 unit; BG 150-200= 1 unit; BG 201-250= 2 unit; BG 251-300= 3 unit; BG 301-350= 4 unit; BG 351-400= 5 unit 30 mL 3   Insulin  Glargine (BASAGLAR  KWIKPEN) 100 UNIT/ML Inject 36 Units into the skin daily. 45 mL 3   Insulin  Pen Needle 31G X 8 MM MISC 1 Device by Does not apply route in the morning, at noon, in the evening, and at bedtime. 400 each 3   Lancet Device MISC Use 3 (three) times daily. May dispense any manufacturer covered by patient's insurance. 1 each 0   Lancets (ACCU-CHEK MULTICLIX) lancets USE TO CHECK BLOOD SUGARS UP TO 4 TIMES DAILY  DX CODE:E11.9 100 each 12   levonorgestrel  (MIRENA ) 20 MCG/24HR IUD 1 each by Intrauterine route once.     losartan  (COZAAR ) 100 MG tablet Take 1 tablet (100 mg total) by mouth daily. 90 tablet 3   metFORMIN  (GLUCOPHAGE -XR) 750 MG 24 hr tablet TAKE 2 TABLETS (1,500 MG TOTAL) BY MOUTH EVERY DAY WITH BREAKFAST *NEW PRESCRIPTION REQUEST* 180 tablet 2   methocarbamol  (ROBAXIN ) 500 MG tablet TAKE 1 TABLET BY MOUTH 3 TIMES DAILY 270 tablet 11   omeprazole (PRILOSEC) 20 MG capsule Take 20 mg by mouth daily.     ondansetron  (ZOFRAN ) 8 MG tablet TAKE 1 TABLET BY MOUTH EVERY 8 HOURS AS NEEDED FOR NAUSEA OR VOMITING. *NEW PRESCRIPTION REQUEST* 90 tablet 0   propranolol  (INDERAL ) 10 MG tablet Take 1 tablet (10 mg total) by mouth 2 (two) times daily. 180 tablet 1   sertraline  (ZOLOFT ) 100 MG tablet TAKE 1 TABLETS BY MOUTH ONCE DAILY 90 tablet 1   Vitamin D ,  Ergocalciferol , (DRISDOL ) 1.25 MG (50000 UNIT) CAPS capsule Take 1 capsule (50,000 Units total) by mouth 2 (two) times a week. 24 capsule 1   No current facility-administered medications for this visit.     Allergies[2]  Assessment & Plan    Morbid obesity with body mass index (BMI) of 50.0 to 59.9 in adult [facility])  Patient has not met goal of at least 5% of body weight loss with comprehensive lifestyle modifications alone in the past 3-6 months. Pharmacotherapy is appropriate to pursue as augmentation. Will start Ozempic  or Mounjaro  (depending on coverage).  Patient previously did well with Ozempic , but lost coverage   Confirmed patient not pregnant and no  personal or family history of medullary thyroid carcinoma (MTC) or Multiple Endocrine Neoplasia syndrome type 2 (MEN 2).   Advised patient on common side effects including nausea, diarrhea, dyspepsia, decreased appetite, and fatigue. Counseled patient on reducing meal size and how to titrate medication to minimize side effects. Patient aware to call if intolerable side effects or if experiencing dehydration, abdominal pain, or dizziness. Patient will adhere to dietary modifications and will target at least 150 minutes of moderate intensity exercise weekly, plus resistance training twice a week (as recommended by the American Heart Association). This resistance training--such as weightlifting, bodyweight exercises, or using resistance bands, adapted to the patient's ability--will help prevent muscle loss.  Injection technique reviewed at today's visit.    Follow up in 1-2 days regarding coverage of Ozempic  or Mounjaro  . If therapy is initiated, phone or MyChart follow-ups will be conducted every 4 weeks for dose titration until the patient reaches the effective therapeutic dose and target weight.   Allean Mink PharmD CPP [facility]  [address] [place], [place] [address] [phone]      [1]  Allergies Allergen  Reactions   Farxiga  [Dapagliflozin ] Other (See Comments)    necrotizing fascititis   Fish Allergy Anaphylaxis   Other Anaphylaxis and Other (See Comments)    Pt has fish allergy Pt has allergy to butter cookies   Penicillins Rash and Other (See Comments)    Told MD that PCN reaction is rash and that she has taken amoxicillin  before. Tolerated Cefepime  X1 during 8/25 admission.    Bactrim  [Sulfamethoxazole -Trimethoprim ] Diarrhea and Rash    Abdominal cramps  [2]  Allergies Allergen Reactions   Farxiga  [Dapagliflozin ] Other (See Comments)    necrotizing fascititis   Fish Allergy Anaphylaxis   Other Anaphylaxis and Other (See Comments)    Pt has fish allergy Pt has allergy to butter cookies   Penicillins Rash and Other (See Comments)    Told MD that PCN reaction is rash and that she has taken amoxicillin  before. Tolerated Cefepime  X1 during 8/25 admission.    Bactrim  [Sulfamethoxazole -Trimethoprim ] Diarrhea and Rash    Abdominal cramps   "

## 2024-07-25 NOTE — Telephone Encounter (Signed)
 Patient now on Medicaid, please try PA for Ozempic  or Mounjaro 

## 2024-07-26 ENCOUNTER — Ambulatory Visit: Admitting: Physical Therapy

## 2024-07-26 DIAGNOSIS — M6281 Muscle weakness (generalized): Secondary | ICD-10-CM

## 2024-07-26 DIAGNOSIS — R52 Pain, unspecified: Secondary | ICD-10-CM
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
## 2024-07-27 ENCOUNTER — Other Ambulatory Visit (HOSPITAL_COMMUNITY): Payer: Self-pay

## 2024-08-01 ENCOUNTER — Other Ambulatory Visit (HOSPITAL_COMMUNITY): Payer: Self-pay

## 2024-08-01 NOTE — Telephone Encounter (Signed)
 Pharmacy Patient Advocate Encounter  Received notification from [facility] that Prior Authorization for ozempic  has been DENIED.  Full denial letter will be uploaded to the media tab. See denial reason below.  Has to fail liraglutide, rybelsus , and trulicity  before they will consider Ozempic 

## 2024-08-01 NOTE — Telephone Encounter (Signed)
 Pharmacy Patient Advocate Encounter   Received notification from Physician's Office that prior authorization for victoza is required/requested.   Insurance verification completed.   The patient is insured through [employer].   Per test claim: The current 28 day co-pay is, $79.19.  No PA needed at this time. This test claim was processed through [facility]- copay amounts may vary at other pharmacies due to pharmacy/plan contracts, or as the patient moves through the different stages of their insurance plan.

## 2024-08-01 NOTE — Progress Notes (Signed)
 "  PROVIDER:  PUJA GOSAI MACZIS, PA  MRN: [id number] DOB: Dec 24, 1980 DATE OF ENCOUNTER: 08/02/2024 Interval History:   [name] is a 43 y.o. female who underwent excisional debridement of NSTI of left labia/perineum on 03/14/2024 by Dr. Polly.  Additional abscess cavity was opened up in the OR on 03/18/2024 and taken back to the OR on 03/19/2024 for bleeding.  Discharged on 03/23/2024 twice daily dressing changes.  She is presenting for another postop visit and states she is doing well.  She believes there may have been some drainage from the wound but is not sure. She is still going to PT, last session on January 12.  She states she had a liver biopsy coming up on January 8.    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    Review of Systems:   ROS All other systems reviewed and are negative.  Medications:   Current Outpatient Medications on File Prior to Visit  Medication Sig Dispense Refill   atorvastatin  (LIPITOR) 20 MG tablet Take 20 mg by mouth once daily     BASAGLAR  KWIKPEN U-100 INSULIN  pen injector (concentration 100 units/mL) INJECT 40 UNITS SUBCUTANEOUSLY 1 TIMES PER DAY *NEW PRESCRIPTION REQUEST*     blood glucose diagnostic test strip 100 strips by Other route     cetirizine  (ZYRTEC ) 10 MG tablet Take 10 mg by mouth once daily     ergocalciferol , vitamin D2, 1,250 mcg (50,000 unit) capsule TAKE 1 CAPSULE BY MOUTH ON TUES AND ON FRIDAYS.     ferrous sulfate  325 (65 FE) MG tablet TAKE ONE (1) TABLET BY MOUTH TWICE DAILY *NEW PRESCRIPTION REQUEST*     insulin  DEGLUDEC (TRESIBA  FLEXTOUCH U-100) pen injector (concentration 100 units/mL) Inject subcutaneously as directed     levonorgestreL  (MIRENA  52 MG) IUD Insert 1 each into the uterus once     losartan  (COZAAR ) 100 MG tablet Take 100 mg by mouth once daily     metFORMIN  (GLUCOPHAGE -XR) 750 MG XR tablet TAKE 2 TABLETS (1,500 MG TOTAL) BY MOUTH EVERY DAY WITH BREAKFAST *NEW  PRESCRIPTION REQUEST*     methocarbamoL  (ROBAXIN ) 500 MG tablet TAKE 1 TABLET BY MOUTH THREE TIMES DAILY *NEW PRESCRIPTION REQUEST*     NOVOLOG  FLEXPEN U-100 INSULIN  pen injector (concentration 100 units/mL) Inject 4 Units subcutaneously     omeprazole (PRILOSEC) 20 MG DR capsule Take 20 mg by mouth once daily     ondansetron  (ZOFRAN ) 4 MG tablet TAKE 1 TABLET BY MOUTH TWICE DAILY FOR 10 DAYS 20 tablet 11   ondansetron  (ZOFRAN ) 8 MG tablet TAKE 1 TABLET BY MOUTH EVERY 8 HOURS AS NEEDED FOR NAUSEA OR VOMITING. *NEW PRESCRIPTION REQUEST*     oxyCODONE -acetaminophen  (PERCOCET) 7.5-325 mg tablet Take 2 tablets by mouth every 4 (four) hours as needed for Pain 15 tablet 0   polyethylene glycol (MIRALAX) packet Take 17 g by mouth once daily     propranoloL  (INDERAL ) 10 MG tablet Take 10 mg by mouth 2 (two) times daily     sertraline  (ZOLOFT ) 100 MG tablet Take 100 mg by mouth once daily     TRUEDRAW LANCING DEVICE Misc USE THREE TIMES A DAY     vortioxetine (TRINTELLIX) 5 mg tablet 1 tablet Orally Once a day; Duration: 30 day(s)     losartan -hydroCHLOROthiazide  (HYZAAR) 50-12.5 mg tablet Take 1 tablet by mouth once daily (Patient not taking: Reported on 08/01/2024)     sertraline  (ZOLOFT ) 50 MG tablet TAKE 1 &  1/2 TABLETS BY MOUTH ONCE DAILY *NEW PRESCRIPTION REQUEST*     No current facility-administered medications on file prior to visit.    Physical Examination:   There were no vitals taken for this visit.  General: Well-developed, well-nourished, in no acute distress.   Left labia: Stellate scar with no open wound   A chaperone, Philis Seats, CMA, was present during the exam.    Assessment and Plan:   Diagnoses and all orders for this visit:  Labial abscess    [name] is status post excisional debridement of NSTI of left labia/perineum on 03/14/2024 by Dr. Polly.  She underwent multiple procedures in the operating room afterwards.  On exam today, the wound  has completely healed without signs of infection.    We discussed returning to work on January 13 after her last PT session.  At this point, she will follow-up as needed and will call if she has any questions or concerns regarding the wound.  Return if symptoms worsen or fail to improve.  Puja Maczis, [facility] "

## 2024-08-01 NOTE — Telephone Encounter (Signed)
 Can you do a quick test claim for Victoza 0.6 mg.  Before we move forward, I want to know if it's cost prohibitive.

## 2024-08-02 ENCOUNTER — Other Ambulatory Visit: Payer: Self-pay | Admitting: Internal Medicine

## 2024-08-02 ENCOUNTER — Ambulatory Visit: Payer: Self-pay | Admitting: Internal Medicine

## 2024-08-02 ENCOUNTER — Encounter: Payer: Self-pay | Admitting: Physical Therapy

## 2024-08-02 ENCOUNTER — Ambulatory Visit: Admitting: Physical Therapy

## 2024-08-02 ENCOUNTER — Ambulatory Visit

## 2024-08-02 DIAGNOSIS — E1169 Type 2 diabetes mellitus with other specified complication: Secondary | ICD-10-CM

## 2024-08-02 DIAGNOSIS — M6281 Muscle weakness (generalized): Secondary | ICD-10-CM

## 2024-08-02 DIAGNOSIS — R52 Pain, unspecified: Secondary | ICD-10-CM

## 2024-08-02 LAB — BASIC METABOLIC PANEL WITH GFR
BUN/Creatinine Ratio: 16 (ref 9–23)
BUN: 14 mg/dL (ref 6–24)
CO2: 22 mmol/L (ref 20–29)
Calcium: 12.5 mg/dL — ABNORMAL HIGH (ref 8.7–10.2)
Chloride: 97 mmol/L (ref 96–106)
Creatinine, Ser: 0.86 mg/dL (ref 0.57–1.00)
Glucose: 177 mg/dL — ABNORMAL HIGH (ref 70–99)
Potassium: 4.7 mmol/L (ref 3.5–5.2)
Sodium: 136 mmol/L (ref 134–144)
eGFR: 86 mL/min/1.73

## 2024-08-02 LAB — TSH RFX ON ABNORMAL TO FREE T4: TSH: 1.62 u[IU]/mL (ref 0.450–4.500)

## 2024-08-02 LAB — LIPOPROTEIN A (LPA): Lipoprotein (a): 207.4 nmol/L — AB

## 2024-08-02 MED ORDER — ATORVASTATIN CALCIUM 40 MG PO TABS: 40.0000 mg | ORAL_TABLET | Freq: Every day | ORAL | 3 refills | Status: AC

## 2024-08-02 NOTE — Telephone Encounter (Signed)
-----   Message from Donell Redgie Butts, MD sent at 08/02/2024 10:10 AM EST -----

## 2024-08-02 NOTE — Telephone Encounter (Signed)
 Can you please contact the patient and let her know that most of her labs have come back normal except for a vitamin D  that is elevated which makes me suspect that she may have sarcoidosis    I do not see where she is scheduled to see oncology, can you please follow-up with our referral coordinator to schedule this patient with oncology    Please schedule the patient for another lab test, and also give her the address to [facility] on [address] as she will need to have an x-ray    Thank you
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
## 2024-08-02 NOTE — Telephone Encounter (Signed)
 Patient aware of results and recommendations.  Appointment for lab scheduled.  Patient says she has had a hard time getting in touch with Dr Federico at oncology.  Spoke with cancer center and they advised we reach out to office manager regarding patient concern.

## 2024-08-03 ENCOUNTER — Other Ambulatory Visit (HOSPITAL_COMMUNITY): Payer: Self-pay

## 2024-08-03 ENCOUNTER — Telehealth: Payer: Self-pay

## 2024-08-03 NOTE — Telephone Encounter (Signed)
 Ozempic  denied on [employer]. Patient will no longer have [employer] as of 08/04/24. Will retry Ozempic  PA on [facility] medicaid once primary coverage has been removed. RPH made aware and agreeable to plan. Follow up reminder has been set in Epic.

## 2024-08-03 NOTE — Telephone Encounter (Signed)
 Ozempic  denied on [employer]. Patient will no longer have [employer] as of 08/04/24. Will retry Ozempic  PA on [facility] medicaid once primary coverage has been removed. RPH made aware and agreeable to plan.

## 2024-08-03 NOTE — Telephone Encounter (Signed)
 [name] - let's do this.  PA for Trulicity .  If it's covered on her plan, can we secondary bill Medicaid to bring the copay down?

## 2024-08-05 ENCOUNTER — Ambulatory Visit: Attending: Student | Admitting: Physical Therapy

## 2024-08-05 ENCOUNTER — Encounter: Payer: Self-pay | Admitting: Physical Therapy

## 2024-08-05 DIAGNOSIS — R52 Pain, unspecified: Secondary | ICD-10-CM | POA: Diagnosis present

## 2024-08-05 DIAGNOSIS — M6281 Muscle weakness (generalized): Secondary | ICD-10-CM | POA: Insufficient documentation

## 2024-08-05 NOTE — Telephone Encounter (Signed)
 Passing along to [facility] pharmacy team. Please retry Ozempic  PA on [facility] plan after 08/08/24. Secondary coverage needs time to fall off.
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
## 2024-08-08 ENCOUNTER — Other Ambulatory Visit

## 2024-08-08 ENCOUNTER — Other Ambulatory Visit (HOSPITAL_COMMUNITY): Payer: Self-pay

## 2024-08-08 ENCOUNTER — Ambulatory Visit

## 2024-08-08 DIAGNOSIS — M6281 Muscle weakness (generalized): Secondary | ICD-10-CM

## 2024-08-08 NOTE — Telephone Encounter (Signed)
 [facility] medicaid still showing as secondary
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
## 2024-08-09 ENCOUNTER — Other Ambulatory Visit: Payer: Self-pay

## 2024-08-09 LAB — BASIC METABOLIC PANEL WITH GFR
BUN/Creatinine Ratio: 14 (calc) (ref 6–22)
BUN: 15 mg/dL (ref 7–25)
CO2: 26 mmol/L (ref 20–32)
Calcium: 11.5 mg/dL — ABNORMAL HIGH (ref 8.6–10.2)
Chloride: 98 mmol/L (ref 98–110)
Creat: 1.06 mg/dL — ABNORMAL HIGH (ref 0.50–0.99)
Glucose, Bld: 203 mg/dL — ABNORMAL HIGH (ref 65–99)
Potassium: 4.6 mmol/L (ref 3.5–5.3)
Sodium: 136 mmol/L (ref 135–146)
eGFR: 67 mL/min/1.73m2

## 2024-08-09 NOTE — Patient Outreach (Signed)
 Complex Care Management   Visit Note  08/09/2024  Name:  [name] MRN: [id number] DOB: March 18, 1981  Situation: Referral received for Complex Care Management related to Diabetes with Cirrhosis of liver without ascites, Type 2 diabetes mellitus with hyperglycemia, with long-term current use of insulin , s/p recent Necrotizing fasciitis to left labia/thigh, Morbid obesity with body mass index (BMI) of 50.0 to 59.9 in adult, Vitamin D  deficiency, Hypoparathyroidism, Essential Hypertension, OSA w/o CPAP usage, Sinus tachycardia, Hypercalcemia. I obtained verbal consent from Patient.  Visit completed with Patient on the phone.  Background:   Past Medical History:  Diagnosis Date   Abnormal Pap smear    f/u wnl   Asthma    DM2 (diabetes mellitus, type 2) (HCC)    DM2 (diabetes mellitus, type 2) (HCC) 09/13/2012   AIC 9.7 09-24-12 Referred to Dr. Jarold after consult with Dr. Darcel Appt. Scheduled  C. Armer, CNM, FNP    Fibroid    Gestational diabetes    Hypertension    Liver disease    Morbid obesity (HCC)    Sleep apnea    not on cpap, diagnosed when pregnant   Urinary tract infection     Assessment: Patient Reported Symptoms:  Cognitive Cognitive Status: Alert and oriented to person, place, and time, Normal speech and language skills Cognitive/Intellectual Conditions Management [RPT]: None reported or documented in medical history or problem list   Health Maintenance Behaviors: Annual physical exam  Neurological Neurological Review of Symptoms: Not assessed    HEENT HEENT Symptoms Reported: Not assessed      Cardiovascular Cardiovascular Symptoms Reported: Irregular pulse Does patient have uncontrolled Hypertension?: Yes Is patient checking Blood Pressure at home?: No Patient's Recent BP reading at home: 110/85 taken with PT Cardiovascular Management Strategies: Medication therapy, Routine screening, Diet modification Cardiovascular Self-Management Outcome: 4  (good) Cardiovascular Comment: patient is newly established with Cardiology, discussed patient is scheduled for an echocardiogram on 08/23/24  Respiratory Respiratory Symptoms Reported: No symptoms reported    Endocrine Endocrine Symptoms Reported: Not assessed    Gastrointestinal Gastrointestinal Symptoms Reported: Distention Additional Gastrointestinal Details: elevated liver enzymes, abnormal liver findings, patient is scheduled for a liver biopsy on 08/11/24 Gastrointestinal Management Strategies:  (routine screening) Gastrointestinal Self-Management Outcome: 3 (uncertain)    Genitourinary Genitourinary Symptoms Reported: Not assessed    Integumentary Integumentary Symptoms Reported: No symptoms reported    Musculoskeletal Musculoskelatal Symptoms Reviewed: Weakness Additional Musculoskeletal Details: patient continues to make progress with outpatient PT Musculoskeletal Management Strategies: Exercise, Adequate rest, Routine screening Musculoskeletal Self-Management Outcome: 4 (good)      Psychosocial Psychosocial Symptoms Reported: No symptoms reported   Major Change/Loss/Stressor/Fears (CP): Medical condition, self Techniques to Cope with Loss/Stress/Change: Diversional activities Quality of Family Relationships: involved, helpful, supportive Do you feel physically threatened by others?: No    08/09/2024    PHQ2-9 Depression Screening   Damarie Schoolfield interest or pleasure in doing things    Feeling down, depressed, or hopeless    PHQ-2 - Total Score    Trouble falling or staying asleep, or sleeping too much    Feeling tired or having Quincee Gittens energy    Poor appetite or overeating     Feeling bad about yourself - or that you are a failure or have let yourself or your family down    Trouble concentrating on things, such as reading the newspaper or watching television    Moving or speaking so slowly that other people could have noticed.  Or the opposite - being so  fidgety or restless that  you have been moving around a lot more than usual    Thoughts that you would be better off dead, or hurting yourself in some way    PHQ2-9 Total Score    If you checked off any problems, how difficult have these problems made it for you to do your work, take care of things at home, or get along with other people    Depression Interventions/Treatment      There were no vitals filed for this visit. Pain Scale: Not given for pain  Medications Reviewed Today     Reviewed by Morgan Clayborne CROME, RN (Registered Nurse) on 08/09/24 at 1614  Med List Status: <None>   Medication Order Taking? Sig Documenting Provider Last Dose Status Informant  Accu-Chek Softclix Lancets lancets [id number] Yes USED TO TEST BLOOD SUGARS 3-4 TIMES DAILY *NEW PRESCRIPTION REQUESTDEWAINE Georgina Speaks, FNP  Active   albuterol  (VENTOLIN  HFA) 108 (90 Base) MCG/ACT inhaler [id number] Yes Inhale 1-2 puffs into the lungs every 6 (six) hours as needed for wheezing or shortness of breath. Wieters, Hallie C, [name]  Active Self, Pharmacy Records  aspirin  EC 81 MG tablet [id number]  Take 1 tablet (81 mg total) by mouth daily. Swallow whole. Thukkani, Arun K, MD  Active   atorvastatin  (LIPITOR) 40 MG tablet [id number]  Take 1 tablet (40 mg total) by mouth daily. Thukkani, Arun K, MD  Active   Blood Glucose Monitoring Suppl (BLOOD GLUCOSE MONITOR SYSTEM) w/Device KIT [id number] Yes Use 3 (three) times daily as directed Samtani, Jai-Gurmukh, MD  Active   cetirizine  (ALLERGY RELIEF CETIRIZINE ) 10 MG tablet [id number] Yes TAKE 1 TABLET BY MOUTH EVERY DAY *NEW PRESCRIPTION REQUESTDEWAINE Georgina Speaks, FNP  Active   Continuous Glucose Sensor (DEXCOM G7 SENSOR) MISC [id number] Yes 1 Device by Does not apply route as directed. Shamleffer, Donell Cardinal, MD  Active   ferrous sulfate  325 (65 FE) MG tablet [id number] Yes Take 1 tablet (325 mg total) by mouth daily with breakfast. Please take with a source of Vitamin C Dorsey, John T IV, MD  Active   fluticasone   (FLONASE ) 50 MCG/ACT nasal spray [id number] Yes Place 1-2 sprays into both nostrils daily. Pura Brittle, NP  Active Self, Pharmacy Records  glucose blood (ACCU-CHEK GUIDE TEST) test strip [id number] Yes USED TO TEST BLOOD SUGARS up to  three IMES DAILY Shamleffer, Donell Cardinal, MD  Active   insulin  aspart (NOVOLOG  FLEXPEN) 100 UNIT/ML FlexPen [id number] Yes Inject 4 Units into the skin 3 (three) times daily with meals. If eating and Blood Glucose (BG) 80 or higher inject 0 units for meal coverage and add correction dose per scale. If not eating, correction dose only. BG <150= 0 unit; BG 150-200= 1 unit; BG 201-250= 2 unit; BG 251-300= 3 unit; BG 301-350= 4 unit; BG 351-400= 5 unit Shamleffer, Ibtehal Jaralla, MD  Active   Insulin  Glargine (BASAGLAR  KWIKPEN) 100 UNIT/ML [id number] Yes Inject 36 Units into the skin daily. Shamleffer, Donell Cardinal, MD  Active   Insulin  Pen Needle 31G X 8 MM MISC [id number] Yes 1 Device by Does not apply route in the morning, at noon, in the evening, and at bedtime. Shamleffer, Donell Cardinal, MD  Active   Lancet Device MISC [id number] Yes Use 3 (three) times daily. May dispense any manufacturer covered by patient's insurance. Samtani, Jai-Gurmukh, MD  Active   Lancets [facility] MULTICLIX) lancets [id number] Yes USE TO CHECK BLOOD SUGARS UP TO 4 TIMES DAILY  DX CODE:E11.9 Shamleffer, Ibtehal Jaralla,  MD  Active Self, Pharmacy Records  levonorgestrel  (MIRENA ) 20 MCG/24HR IUD [id number] Yes 1 each by Intrauterine route once. [provider]  Active Self, Pharmacy Records           Med Note (CRUTHIS, CHLOE C   Mon Mar 14, 2024  8:21 AM) Still has this inserted.   losartan  (COZAAR ) 100 MG tablet [id number]  Take 1 tablet (100 mg total) by mouth daily. Thukkani, Arun K, MD  Active   metFORMIN  (GLUCOPHAGE -XR) 750 MG 24 hr tablet [id number] Yes TAKE 2 TABLETS (1,500 MG TOTAL) BY MOUTH EVERY DAY WITH BREAKFAST *NEW PRESCRIPTION REQUESTDEWAINE Ada, Gaines, FNP  Active    methocarbamol  (ROBAXIN ) 500 MG tablet [id number] Yes TAKE 1 TABLET BY MOUTH 3 TIMES DAILY [name], FNP  Active   omeprazole (PRILOSEC) 20 MG capsule [id number] Yes Take 20 mg by mouth daily. [provider]  Active Self, Pharmacy Records  ondansetron  (ZOFRAN ) 8 MG tablet [id number] Yes TAKE 1 TABLET BY MOUTH EVERY 8 HOURS AS NEEDED FOR NAUSEA OR VOMITING. *NEW PRESCRIPTION REQUESTDEWAINE Ada, Gaines, FNP  Active   propranolol  (INDERAL ) 10 MG tablet [id number] Yes Take 1 tablet (10 mg total) by mouth 2 (two) times daily. [name], FNP  Active   sertraline  (ZOLOFT ) 100 MG tablet [id number] Yes TAKE 1 TABLETS BY MOUTH ONCE DAILY [name], FNP  Active   Vitamin D , Ergocalciferol , (DRISDOL ) 1.25 MG (50000 UNIT) CAPS capsule [id number] Yes Take 1 capsule (50,000 Units total) by mouth 2 (two) times a week. [name], FNP  Active             Recommendation:   Specialty provider follow-up   08/10/2024 Status: Sch   Time: 3:30 PM Length: 45  Visit Type: TREATMENT-ORTHO [161] Copay: $0.00  Provider: Rosann Washington Greener, PT Department: [facility] ST    08/11/2024 Status: Sch   Time: 11:00 AM Length: 90  Visit Type: PRE PROCEDURE 90 [537] Copay: $0.00  Provider: [facility] ROOM Department: [facility]   08/11/2024 Status: Sch   Time: 1:00 PM Length: 60  Visit Type: US  [facility] [[id number]] Copay: $0.00  Provider: [facility]  2 Department: [facility]    08/23/2024 Status: Sch   Time: 9:15 AM Length: 60  Visit Type: ECHOCARDIOGRAM [881891] Copay: $0.00  Provider: [facility] 1 Department: [facility] ECHO   Follow Up Plan:   Telephone follow up appointment date/time  08/26/2024 Status: Sch   Time: 3:00 PM Length: 30  Visit Type: VBCI TELEPHONE CALL 30 [2502] Copay: $0.00  Provider: Morgan Clayborne CROME, RN Department: [facility]   Clayborne Morgan RN BSN CCM [place]  [facility], [facility] Nurse Care Coordinator  Direct  Dial : [phone] Website: [email or web address]

## 2024-08-09 NOTE — Patient Instructions (Signed)
 Visit Information  Thank you for taking time to visit with me today. Please don't hesitate to contact me if I can be of assistance to you before our next scheduled appointment.  Your next care management appointment is by telephone on Friday, January 23 at 3:00 PM  Please call the care guide team at [phone] if you need to cancel, schedule, or reschedule an appointment.   Please call 1-800-273-TALK (toll free, 24 hour hotline) if you are experiencing a Mental Health or Behavioral Health Crisis or need someone to talk to.  Clayborne Ly RN BSN CCM [place]  [facility], [facility] Nurse Care Coordinator  Direct Dial : [phone] Website: [email or web address]

## 2024-08-09 NOTE — Addendum Note (Signed)
 Addended by: ROSANN CHRISTMAS DIETER on: 08/09/2024 12:06 PM   Modules accepted: Orders

## 2024-08-10 ENCOUNTER — Ambulatory Visit: Payer: Self-pay | Admitting: Internal Medicine

## 2024-08-10 ENCOUNTER — Other Ambulatory Visit (HOSPITAL_COMMUNITY): Payer: Self-pay

## 2024-08-10 ENCOUNTER — Other Ambulatory Visit: Payer: Self-pay | Admitting: Radiology

## 2024-08-10 ENCOUNTER — Ambulatory Visit

## 2024-08-10 ENCOUNTER — Other Ambulatory Visit (HOSPITAL_COMMUNITY): Payer: Self-pay | Admitting: Radiology

## 2024-08-10 DIAGNOSIS — R52 Pain, unspecified: Secondary | ICD-10-CM

## 2024-08-10 DIAGNOSIS — K7460 Unspecified cirrhosis of liver: Secondary | ICD-10-CM

## 2024-08-10 DIAGNOSIS — M6281 Muscle weakness (generalized): Secondary | ICD-10-CM

## 2024-08-10 NOTE — Telephone Encounter (Signed)
 Insurance still showing there is a primary
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
## 2024-08-10 NOTE — H&P (Addendum)
 "    Chief Complaint: Elevated liver enzymes  Referring Provider(s): Cunningham,Scott E   Supervising Physician: Hughes Simmonds  Patient Status: [facility] - Out-pt  History of Present Illness: [name] is a very pleasant 44 y.o. female followed by Dr. Stacia with [facility] with PMH significant for HTN, asthma, DM II. GI referral was originally made when elevated liver enzymes were noted with admission for abscess requiring surgical debridement. MRI revealed nodular liver. Blood tests have ruled out viral hepatitis and autoimmune causes. Family h/o sarcoidosis also identified in that work up. A liver biopsy was recommended to confirm cirrhosis dx for which she was referred to IR and presents today to undergo as an outpatient.   Confirms NPO since MN and ride/supervision available for 24 hours. Has a responsible party that is blind but can accompany in uber.    Denies fever, chills, CP, sore throat, N/V, abd pain, blood in stool or urine, abnormal bruising, leg swelling. Felt shob this AM in uber from industrial smell bothering my asthma, but resolved with home albuterol .   Allergies Reviewed:  Farxiga  [dapagliflozin ], Fish allergy, Other, Penicillins, and Bactrim  [sulfamethoxazole -trimethoprim ]   Patient is Full Code  Past Medical History:  Diagnosis Date   Abnormal Pap smear    f/u wnl   Asthma    DM2 (diabetes mellitus, type 2) (HCC)    DM2 (diabetes mellitus, type 2) (HCC) 09/13/2012   AIC 9.7 09-24-12 Referred to Dr. Jarold after consult with Dr. Darcel Appt. Scheduled  C. Armer, CNM, FNP    Fibroid    Gestational diabetes    Hypertension    Liver disease    Morbid obesity (HCC)    Sleep apnea    not on cpap, diagnosed when pregnant   Urinary tract infection     Past Surgical History:  Procedure Laterality Date   BREAST BIOPSY Right 07/24/2022   US  RT BREAST BX W LOC DEV 1ST LESION IMG BX SPEC US  GUIDE 07/24/2022 [facility]MO[facility]   CESAREAN  SECTION     DILATION AND CURETTAGE OF UTERUS     INCISION AND DRAINAGE OF WOUND N/A 03/13/2024   Procedure: IRRIGATION AND DEBRIDEMENT WOUND;  Surgeon: Polly Cordella LABOR, MD;  Location: [facility] OR;  Service: General;  Laterality: N/A;   INCISION AND DRAINAGE PERIRECTAL ABSCESS N/A 03/16/2024   Procedure: INCISION AND DRAINAGE, ABSCESS, PERIRECTAL;  Surgeon: Lyndel Deward PARAS, MD;  Location: [facility] OR;  Service: General;  Laterality: N/A;   IRRIGATION AND DEBRIDEMENT OF NECROTIZING SOFT TISSUE INFECTION Left 03/18/2024   Procedure: EXAM UNDER ANESTHESIA; INCISION, DRAINAGE, AND DEBRIDEMENT OF NECROTIZING SOFT TISSUE INFECTION LEFT LABIA AND THIGH;  Surgeon: Stechschulte, Deward PARAS, MD;  Location: [facility] OR;  Service: General;  Laterality: Left;   RECTAL EXAM UNDER ANESTHESIA N/A 03/13/2024   Procedure: EXAM UNDER ANESTHESIA,;  Surgeon: Polly Cordella LABOR, MD;  Location: [facility];  Service: General;  Laterality: N/A;  I AND D left labia necrotizing infection   TUBAL LIGATION     WOUND EXPLORATION Left 03/19/2024   Procedure: EXPLORATION OF LEFT GROIN WOUND;  Surgeon: Dasie Leonor CROME, MD;  Location: [facility] OR;  Service: General;  Laterality: Left;  LEFT GROIN WOUND EXPLORATION      Medications: Prior to Admission medications  Medication Sig Start Date End Date Taking? Authorizing Provider  Accu-Chek Softclix Lancets lancets USED TO TEST BLOOD SUGARS 3-4 TIMES DAILY *NEW PRESCRIPTION REQUEST* 04/01/24   Georgina Speaks, FNP  albuterol  (VENTOLIN  HFA) 108 [phone] Base) MCG/ACT inhaler  Inhale 1-2 puffs into the lungs every 6 (six) hours as needed for wheezing or shortness of breath. 07/23/20   Wieters, Hallie C, [name]  aspirin  EC 81 MG tablet Take 1 tablet (81 mg total) by mouth daily. Swallow whole. 07/21/24   Thukkani, Arun K, MD  atorvastatin  (LIPITOR) 40 MG tablet Take 1 tablet (40 mg total) by mouth daily. 08/02/24   Thukkani, Arun K, MD  Blood Glucose Monitoring Suppl (BLOOD GLUCOSE MONITOR SYSTEM) w/Device KIT Use 3 (three)  times daily as directed 03/22/24   Samtani, Jai-Gurmukh, MD  cetirizine  (ALLERGY RELIEF CETIRIZINE ) 10 MG tablet TAKE 1 TABLET BY MOUTH EVERY DAY *NEW PRESCRIPTION REQUEST* 04/01/24   Georgina Speaks, FNP  Continuous Glucose Sensor (DEXCOM G7 SENSOR) MISC 1 Device by Does not apply route as directed. 06/27/24   Shamleffer, Donell Cardinal, MD  ferrous sulfate  325 (65 FE) MG tablet Take 1 tablet (325 mg total) by mouth daily with breakfast. Please take with a source of Vitamin C 06/20/24   Dorsey, John T IV, MD  fluticasone  (FLONASE ) 50 MCG/ACT nasal spray Place 1-2 sprays into both nostrils daily. 04/18/21   Ghumman, Ramandeep, NP  glucose blood (ACCU-CHEK GUIDE TEST) test strip USED TO TEST BLOOD SUGARS up to  three IMES DAILY 05/05/24   Shamleffer, Donell Cardinal, MD  insulin  aspart (NOVOLOG  FLEXPEN) 100 UNIT/ML FlexPen Inject 4 Units into the skin 3 (three) times daily with meals. If eating and Blood Glucose (BG) 80 or higher inject 0 units for meal coverage and add correction dose per scale. If not eating, correction dose only. BG <150= 0 unit; BG 150-200= 1 unit; BG 201-250= 2 unit; BG 251-300= 3 unit; BG 301-350= 4 unit; BG 351-400= 5 unit 04/11/24   Shamleffer, Donell Cardinal, MD  Insulin  Glargine (BASAGLAR  KWIKPEN) 100 UNIT/ML Inject 36 Units into the skin daily. 04/11/24   Shamleffer, Ibtehal Jaralla, MD  Insulin  Pen Needle 31G X 8 MM MISC 1 Device by Does not apply route in the morning, at noon, in the evening, and at bedtime. 04/11/24   Shamleffer, Donell Cardinal, MD  Lancet Device MISC Use 3 (three) times daily. May dispense any manufacturer covered by patient's insurance. 03/22/24   Samtani, Jai-Gurmukh, MD  Lancets (ACCU-CHEK MULTICLIX) lancets USE TO CHECK BLOOD SUGARS UP TO 4 TIMES DAILY  DX CODE:E11.9 05/29/22   Shamleffer, Ibtehal Jaralla, MD  levonorgestrel  (MIRENA ) 20 MCG/24HR IUD 1 each by Intrauterine route once.    [provider]  losartan  (COZAAR ) 100 MG tablet Take 1 tablet (100  mg total) by mouth daily. 07/21/24   Thukkani, Arun K, MD  metFORMIN  (GLUCOPHAGE -XR) 750 MG 24 hr tablet TAKE 2 TABLETS (1,500 MG TOTAL) BY MOUTH EVERY DAY WITH BREAKFAST *NEW PRESCRIPTION REQUEST* 04/01/24   Georgina Speaks, FNP  methocarbamol  (ROBAXIN ) 500 MG tablet TAKE 1 TABLET BY MOUTH 3 TIMES DAILY 05/13/24   Moore, Janece, FNP  omeprazole (PRILOSEC) 20 MG capsule Take 20 mg by mouth daily.    [provider]  ondansetron  (ZOFRAN ) 8 MG tablet TAKE 1 TABLET BY MOUTH EVERY 8 HOURS AS NEEDED FOR NAUSEA OR VOMITING. *NEW PRESCRIPTION REQUEST* 04/01/24   Georgina Speaks, FNP  propranolol  (INDERAL ) 10 MG tablet Take 1 tablet (10 mg total) by mouth 2 (two) times daily. 07/20/24   Moore, Janece, FNP  sertraline  (ZOLOFT ) 100 MG tablet TAKE 1 TABLETS BY MOUTH ONCE DAILY 07/20/24   Moore, Janece, FNP  Vitamin D , Ergocalciferol , (DRISDOL ) 1.25 MG (50000 UNIT) CAPS capsule Take 1 capsule (  50,000 Units total) by mouth 2 (two) times a week. 06/16/24   Georgina Speaks, FNP     Family History  Problem Relation Age of Onset   Allergies Mother    Asthma Mother    Hypertension Mother    Diabetes Mother    Diabetes Father    Bladder Cancer Maternal Uncle    Diabetes Maternal Grandmother    Heart disease Maternal Grandmother    Liver cancer Other    Liver cancer Other    Anesthesia problems Neg Hx    Hypotension Neg Hx    Malignant hyperthermia Neg Hx    Pseudochol deficiency Neg Hx    Hearing loss Neg Hx    Breast cancer Neg Hx     Social History   Socioeconomic History   Marital status: Legally Separated    Spouse name: Married   Number of children: 3   Years of education: Not on file   Highest education level: Not on file  Occupational History   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    Comment: BTL  Other Topics Concern   Not on file  Social History Narrative   Currently [redacted] weeks pregnant with first child (boy) Due 04/16/11         Social Drivers of Health   Tobacco Use: Medium Risk (08/10/2024)   Patient History    Smoking Tobacco Use: Former    Smokeless Tobacco Use: Former    Passive Exposure: Not on Actuary Strain: High Risk (04/11/2024)   Overall Financial Resource Strain (CARDIA)    Difficulty of Paying Living Expenses: Hard  Food Insecurity: Food Insecurity Present (06/09/2024)   Epic    Worried About Programme Researcher, Broadcasting/film/video in the Last Year: Sometimes true    Ran Out of Food in the Last Year: Sometimes true  Transportation Needs: No Transportation Needs (06/09/2024)   Epic    Lack of Transportation (Medical): No    Lack of Transportation (Non-Medical): No  Physical Activity: Not on file  Stress: Not on file  Social Connections: Not on file  Depression (PHQ2-9): High Risk (07/07/2024)   Depression (PHQ2-9)    PHQ-2 Score: 12  Alcohol Screen: Low Risk (07/07/2024)   Alcohol Screen    Last Alcohol Screening Score (AUDIT): 0  Housing: Unknown (06/09/2024)   Epic    Unable to Pay for Housing in the Last Year: No    Number of Times Moved in the Last Year: Not on file    Homeless in the Last Year: No  Utilities: Not At Risk (06/09/2024)   Epic    Threatened with loss of utilities: No  Recent Concern: Utilities - At Risk (03/30/2024)   Epic    Threatened with loss of utilities: Yes  Health Literacy: Not on file     Review of Systems: A 12 point ROS discussed and pertinent positives are indicated in the HPI above.  All other systems are negative.  Vital Signs: BP 122/87   Pulse (!) 108   Temp (!) 100.8 F (38.2 C) (Oral)    Resp (!) 21   LMP 08/09/2024 (Exact Date)   SpO2 96%     Physical Exam Constitutional:      Appearance: She is obese.  HENT:     Mouth/Throat:     Mouth: Mucous membranes are moist.     Pharynx: Oropharynx is clear.  Cardiovascular:     Rate and Rhythm: Tachycardia present.     Pulses: Normal pulses.     Heart sounds: Normal heart sounds.  Pulmonary:     Effort: Pulmonary effort is normal.     Breath sounds: Normal breath sounds.  Abdominal:     General: There is no distension.     Palpations: Abdomen is soft.     Tenderness: There is no abdominal tenderness.  Musculoskeletal:     Right lower leg: No edema.     Left lower leg: No edema.  Skin:    General: Skin is warm and dry.  Neurological:     Mental Status: She is alert and oriented to person, place, and time.  Psychiatric:        Mood and Affect: Mood normal.        Behavior: Behavior normal.        Thought Content: Thought content normal.        Judgment: Judgment normal.     Imaging: LONG TERM MONITOR (3-14 DAYS) Result Date: 07/18/2024 Zio monitor Result date: 06/18/24-07/02/24 HR 59-211, avg 97 bpm. NSR with 1' AVB. Brief NSVT (longest 5 beats). PACs < 1%. PVCs < 1%.     Labs:  CBC: Recent Labs    03/22/24 1034 03/25/24 1516 05/05/24 1102 06/20/24 0852  WBC 13.4* 14.6* 9.9 7.3  HGB 7.4* 7.8* 11.8 13.2  HCT 23.3* 25.0* 38.7 42.9  PLT 374 614* 612* 520*    COAGS: Recent Labs    03/13/24 2139 03/20/24 0317 05/23/24 1600  INR 1.3* 1.2 1.3*  APTT 36  --   --     BMP: Recent Labs    03/20/24 0317 03/21/24 0501 03/21/24 0501 03/25/24 1516 05/05/24 1102 06/20/24 0852 06/27/24 1047 07/06/24 0935 08/01/24 1015 08/08/24 1532  NA 140 138  --  135   < > 137 138 137 136 136  K 5.0 4.8  --  5.0   < > 4.1 4.4 4.8 4.7 4.6  CL 109 105  --  99   < > 99 102 100 97 98  CO2 24 25  --  29   < > 29 25 25 22 26   GLUCOSE 278* 223*  --  229*   < > 155* 117 119* 177* 203*  BUN 24* 21*  --  9   <  > 17 18 15 14 15   CALCIUM  9.3 8.8*  --  9.3   < > 11.1* 12.1* 11.9* 12.5* 11.5*  CREATININE 0.92 0.75   < > 0.57   < > 0.81 0.69 0.63 0.86 1.06*  GFRNONAA >60 >60  --  >60  --  >60  --   --   --   --    < > = values in this interval not displayed.    LIVER FUNCTION TESTS: Recent Labs    03/25/24 1516 05/05/24 1102 05/23/24 1600 06/20/24 0852 07/20/24 1018  BILITOT 0.6 0.8 1.0 1.3*  --   AST 35 39 49* 72*  --   ALT 43  34* 41* 74*  --   ALKPHOS 273* 393* 476* 531*  --   PROT 6.5 7.9 8.1 8.4* 7.8  ALBUMIN  2.9* 3.2* 3.2* 3.4*  --     TUMOR MARKERS: Recent Labs    03/25/24 1516 05/23/24 1600  AFPTM  --  2.4  CEA 1.21  --     Assessment and Plan:  Request for image guided non-targeted liver biopsy approved for 08/11/24 with Dr. Hughes to confirm cirrhosis dx.  No contraindications for procedure identified in ROS, physical exam, or review of pre-sedation considerations. CBC and INR pending at time of note, will be reviewed prior to start.  06/06/24 MR Abd imaging available and reviewed: Cirrhotic liver without focal lesion. No ascites. No splenomegaly. ROS neg for infection concern. Low grade fever and mild tachycardia (tachycardic at discharge previously). Will check WBC.  LD ASA: never started this medication per pt Abx not indicated    Risks and benefits of liver biopsy via image guidance was discussed with the patient and/or patient's family including, but not limited to bleeding, infection, damage to adjacent structures or low yield requiring additional tests.  All of the questions were answered and there is agreement to proceed.  Consent signed and in chart.   Thank you for allowing our service to participate in [name] 's care.    Electronically Signed: Laymon Coast, NP   08/11/2024, 12:23 PM     I spent a total of  15 Minutes   in face to face in clinical consultation, greater than 50% of which was counseling/coordinating care for image  guided random liver biopsy.   (A copy of this note was sent to the referring provider and the time of visit.)  "

## 2024-08-11 ENCOUNTER — Ambulatory Visit (HOSPITAL_COMMUNITY)
Admission: RE | Admit: 2024-08-11 | Discharge: 2024-08-11 | Disposition: A | Discharge status: Home / Self Care | Source: Ambulatory Visit | Attending: Gastroenterology | Admitting: Gastroenterology

## 2024-08-11 ENCOUNTER — Encounter (HOSPITAL_COMMUNITY): Payer: Self-pay

## 2024-08-11 DIAGNOSIS — Z794 Long term (current) use of insulin: Secondary | ICD-10-CM | POA: Insufficient documentation

## 2024-08-11 DIAGNOSIS — E119 Type 2 diabetes mellitus without complications: Secondary | ICD-10-CM | POA: Insufficient documentation

## 2024-08-11 DIAGNOSIS — J45909 Unspecified asthma, uncomplicated: Secondary | ICD-10-CM | POA: Diagnosis not present

## 2024-08-11 DIAGNOSIS — K753 Granulomatous hepatitis, not elsewhere classified: Secondary | ICD-10-CM | POA: Insufficient documentation

## 2024-08-11 DIAGNOSIS — K7460 Unspecified cirrhosis of liver: Secondary | ICD-10-CM | POA: Diagnosis present

## 2024-08-11 DIAGNOSIS — R748 Abnormal levels of other serum enzymes: Secondary | ICD-10-CM | POA: Insufficient documentation

## 2024-08-11 DIAGNOSIS — Z7984 Long term (current) use of oral hypoglycemic drugs: Secondary | ICD-10-CM | POA: Insufficient documentation

## 2024-08-11 DIAGNOSIS — I10 Essential (primary) hypertension: Secondary | ICD-10-CM | POA: Diagnosis not present

## 2024-08-11 HISTORY — DX: Dyspnea, unspecified: R06.00

## 2024-08-11 LAB — CBC
HCT: 44.3 % (ref 36.0–46.0)
Hemoglobin: 14 g/dL (ref 12.0–15.0)
MCH: 26.3 pg (ref 26.0–34.0)
MCHC: 31.6 g/dL (ref 30.0–36.0)
MCV: 83.1 fL (ref 80.0–100.0)
Platelets: 482 K/uL — ABNORMAL HIGH (ref 150–400)
RBC: 5.33 MIL/uL — ABNORMAL HIGH (ref 3.87–5.11)
RDW: 20.1 % — ABNORMAL HIGH (ref 11.5–15.5)
WBC: 8.7 K/uL (ref 4.0–10.5)
nRBC: 0 % (ref 0.0–0.2)

## 2024-08-11 LAB — GLUCOSE, CAPILLARY
Glucose-Capillary: 144 mg/dL — ABNORMAL HIGH (ref 70–99)
Glucose-Capillary: 115 mg/dL — ABNORMAL HIGH (ref 70–99)

## 2024-08-11 LAB — PROTIME-INR
INR: 1.1 (ref 0.8–1.2)
Prothrombin Time: 14.4 s (ref 11.4–15.2)

## 2024-08-11 MED ORDER — GELATIN ABSORBABLE 12-7 MM EX MISC
CUTANEOUS | Status: AC
  Filled 2024-08-11: qty 1

## 2024-08-11 MED ORDER — FENTANYL CITRATE (PF) 100 MCG/2ML IJ SOLN
INTRAMUSCULAR | Status: AC | PRN
  Administered 2024-08-11 (×2): 50 ug via INTRAVENOUS

## 2024-08-11 MED ORDER — MIDAZOLAM HCL (PF) 2 MG/2ML IJ SOLN
INTRAMUSCULAR | Status: AC | PRN
  Administered 2024-08-11 (×2): 1 mg via INTRAVENOUS

## 2024-08-11 MED ORDER — SODIUM CHLORIDE 0.9 % IV SOLN: INTRAVENOUS | Status: DC

## 2024-08-11 MED ORDER — MIDAZOLAM HCL 2 MG/2ML IJ SOLN
INTRAMUSCULAR | Status: AC
  Filled 2024-08-11: qty 2

## 2024-08-11 MED ORDER — OXYCODONE HCL 5 MG PO TABS
10.0000 mg | ORAL_TABLET | ORAL | Status: DC | PRN
  Administered 2024-08-11: 10 mg via ORAL
  Filled 2024-08-11: qty 2

## 2024-08-11 MED ORDER — FENTANYL CITRATE (PF) 100 MCG/2ML IJ SOLN
INTRAMUSCULAR | Status: AC
  Filled 2024-08-11: qty 2

## 2024-08-11 MED ORDER — ONDANSETRON HCL 4 MG/2ML IJ SOLN
INTRAMUSCULAR | Status: AC | PRN
  Administered 2024-08-11: 4 mg via INTRAVENOUS

## 2024-08-11 MED ORDER — ONDANSETRON HCL 4 MG/2ML IJ SOLN
INTRAMUSCULAR | Status: AC
  Filled 2024-08-11: qty 2

## 2024-08-11 MED ORDER — LIDOCAINE HCL 1 % IJ SOLN
INTRAMUSCULAR | Status: AC
  Filled 2024-08-11: qty 20

## 2024-08-11 NOTE — Discharge Instructions (Signed)
 MAY CONTACT INTERVENTIONAL RADIOLOGY CLINIC [phone] WITH ANY QUESTIONS OR CONCERNS  MAY REMOVE DRESSING TOMORROW AND SHOWER

## 2024-08-11 NOTE — Procedures (Signed)
 Vascular and Interventional Radiology Procedure Note  Patient: [name] DOB: August 07, 1980 Medical Record Number: [id number] Note Date/Time: 08/11/2024 1:31 PM   Performing Physician: Thom Hall, MD Assistant(s): None  Diagnosis: Hx cirrhosis, q sarcoidosis.  Procedure: LIVER BIOPSY  Anesthesia: Conscious Sedation Complications: None Estimated Blood Loss: Minimal Specimens: Sent for Pathology  Findings:  Successful Ultrasound-guided biopsy of liver. A total of 3 samples were obtained. Hemostasis of the tract was achieved using Gelfoam Slurry Embolization.  Plan: Bed rest for 3 hours.  See detailed procedure note with images in PACS. The patient tolerated the procedure well without incident or complication and was returned to Recovery in stable condition.    Thom Hall, MD [facility] Radiology   Pager. [phone] Clinic. [phone]

## 2024-08-12 ENCOUNTER — Encounter: Payer: Self-pay | Admitting: Gastroenterology

## 2024-08-12 ENCOUNTER — Telehealth: Payer: Self-pay | Admitting: Physician Assistant

## 2024-08-12 LAB — SURGICAL PATHOLOGY

## 2024-08-12 NOTE — Telephone Encounter (Signed)
 Left voicemail regarding scheduled appt. Pt has been made aware of appt date and time.

## 2024-08-14 ENCOUNTER — Other Ambulatory Visit: Payer: Self-pay | Admitting: Hematology and Oncology

## 2024-08-14 DIAGNOSIS — K7469 Other cirrhosis of liver: Secondary | ICD-10-CM

## 2024-08-14 DIAGNOSIS — D508 Other iron deficiency anemias: Secondary | ICD-10-CM

## 2024-08-14 LAB — ANGIOTENSIN CONVERTING ENZYME: Angiotensin-Converting Enzyme: 168 U/L — ABNORMAL HIGH (ref 9–67)

## 2024-08-14 NOTE — Progress Notes (Unsigned)
 " [facility] [phone]   [phone]  PROGRESS NOTE  Patient Care Team: Georgina Speaks, FNP as PCP - General (General Practice) Arnell Rockney PARAS, [facility] (Inactive) (Pharmacist) Shamleffer, Donell Cardinal, MD as Attending Physician (Endocrinology) Zina Jerilynn LABOR, MD as Consulting Physician (Obstetrics and Gynecology) Morgan Clayborne CROME, RN as VBCI Care Management Federico Norleen ONEIDA MADISON, MD as Consulting Physician (Hematology and Oncology)  Hematological/Oncological History # Iron  Deficiency Anemia 2/2 to GYN Bleeding # Liver Lesions (Resolved)   Interval History:  Felicity Penix 44 y.o. female with medical history significant for iron  deficiency anemia secondary menstrual bleeding who presents for a follow up visit. The patient's last visit was on 06/20/2024. In the interim since the last visit she has stopped taking her p.o. iron  supplement.  On exam today Ms. Redondo reports she continues to have menstrual cycle bleeding which is crazy.  She reports it is not very consistent but does have a lot of cramping.  She is not currently taking any medications to stop her cycle but is currently taking Midol  to help with the cramps and the pain.  She reports that it has been helpful.  She reports that she is unsure why her calcium  levels are elevated.  She is not currently taking any calcium  supplements.  She does have a strong family history of sarcoidosis with her mom and sister having it.  She reports that she does have to take Zofran  on a near daily basis due to nausea.  She reports that she is not having any trouble with diarrhea and her nausea has not resulted in vomiting.  She reports that she is not having any issues with lightheadedness, dizziness, shortness of breath.  She is not having any headache or vision changes.  Overall she is eager to find a solution for the sarcoidosis noted in her liver.  A full 10 point ROS is otherwise negative.  MEDICAL HISTORY:   Past Medical History:  Diagnosis Date   Abnormal Pap smear    f/u wnl   Asthma    DM2 (diabetes mellitus, type 2) (HCC)    DM2 (diabetes mellitus, type 2) (HCC) 09/13/2012   AIC 9.7 09-24-12 Referred to Dr. Jarold after consult with Dr. Darcel Appt. Scheduled  C. Armer, CNM, FNP    Dyspnea    Fibroid    Gestational diabetes    Hypertension    Liver disease    Morbid obesity (HCC)    Sleep apnea    not on cpap, diagnosed when pregnant   Urinary tract infection     SURGICAL HISTORY: Past Surgical History:  Procedure Laterality Date   BREAST BIOPSY Right 07/24/2022   US  RT BREAST BX W LOC DEV 1ST LESION IMG BX SPEC US  GUIDE 07/24/2022 [facility] MAMMOGRAPHY   CESAREAN SECTION     DILATION AND CURETTAGE OF UTERUS     INCISION AND DRAINAGE OF WOUND N/A 03/13/2024   Procedure: IRRIGATION AND DEBRIDEMENT WOUND;  Surgeon: Polly Cordella LABOR, MD;  Location: [facility] OR;  Service: General;  Laterality: N/A;   INCISION AND DRAINAGE PERIRECTAL ABSCESS N/A 03/16/2024   Procedure: INCISION AND DRAINAGE, ABSCESS, PERIRECTAL;  Surgeon: Lyndel Deward PARAS, MD;  Location: [facility] OR;  Service: General;  Laterality: N/A;   IRRIGATION AND DEBRIDEMENT OF NECROTIZING SOFT TISSUE INFECTION Left 03/18/2024   Procedure: EXAM UNDER ANESTHESIA; INCISION, DRAINAGE, AND DEBRIDEMENT OF NECROTIZING SOFT TISSUE INFECTION LEFT LABIA AND THIGH;  Surgeon: Lyndel Deward PARAS, MD;  Location: [facility] OR;  Service: General;  Laterality: Left;   RECTAL EXAM UNDER ANESTHESIA N/A 03/13/2024   Procedure: EXAM UNDER ANESTHESIA,;  Surgeon: Polly Cordella LABOR, MD;  Location: [facility] OR;  Service: General;  Laterality: N/A;  I AND D left labia necrotizing infection   TUBAL LIGATION     WOUND EXPLORATION Left 03/19/2024   Procedure: EXPLORATION OF LEFT GROIN WOUND;  Surgeon: Dasie Leonor CROME, MD;  Location: [facility] OR;  Service: General;  Laterality: Left;  LEFT GROIN WOUND EXPLORATION    SOCIAL HISTORY: Social History   Socioeconomic History    Marital status: Legally Separated    Spouse name: Married   Number of children: 3   Years of education: Not on file   Highest education level: Not on file  Occupational History   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    Comment: BTL  Other Topics Concern   Not on file  Social History Narrative   Currently [redacted] weeks pregnant with first child (boy) Due 04/16/11         Social Drivers of Health   Tobacco Use: Medium Risk (08/11/2024)   Patient History    Smoking Tobacco Use: Former    Smokeless Tobacco Use: Former    Passive Exposure: Not on Actuary Strain: High Risk (04/11/2024)   Overall Financial Resource Strain (CARDIA)    Difficulty of Paying Living Expenses: Hard  Food Insecurity: Food Insecurity Present (06/09/2024)   Epic    Worried About Programme Researcher, Broadcasting/film/video in the Last Year: Sometimes true    Ran Out of Food in the Last Year: Sometimes true  Transportation Needs: No Transportation Needs (06/09/2024)   Epic    Lack of Transportation (Medical): No    Lack of Transportation (Non-Medical): No  Physical Activity: Not on file  Stress: Not on file  Social Connections: Not on file  Intimate Partner Violence: Not At Risk (06/09/2024)   Epic    Fear of Current or Ex-Partner: No    Emotionally Abused: No    Physically Abused: No    Sexually Abused: No  Depression (PHQ2-9): High Risk (07/07/2024)   Depression (PHQ2-9)    PHQ-2 Score: 12  Alcohol Screen: Low Risk (07/07/2024)   Alcohol Screen    Last Alcohol Screening Score (AUDIT): 0  Housing:  Unknown (06/09/2024)   Epic    Unable to Pay for Housing in the Last Year: No    Number of Times Moved in the Last Year: Not on file    Homeless in the Last Year: No  Utilities: Not At Risk (06/09/2024)   Epic    Threatened with loss of utilities: No  Recent Concern: Utilities - At Risk (03/30/2024)   Epic    Threatened with loss of utilities: Yes  Health Literacy: Not on file    FAMILY HISTORY: Family History  Problem Relation Age of Onset   Allergies Mother    Asthma Mother    Hypertension Mother  Diabetes Mother    Diabetes Father    Bladder Cancer Maternal Uncle    Diabetes Maternal Grandmother    Heart disease Maternal Grandmother    Liver cancer Other    Liver cancer Other    Anesthesia problems Neg Hx    Hypotension Neg Hx    Malignant hyperthermia Neg Hx    Pseudochol deficiency Neg Hx    Hearing loss Neg Hx    Breast cancer Neg Hx     ALLERGIES:  is allergic to farxiga  [dapagliflozin ], fish allergy, other, penicillins, and bactrim  [sulfamethoxazole -trimethoprim ].  MEDICATIONS:  Current Outpatient Medications  Medication Sig Dispense Refill   Accu-Chek Softclix Lancets lancets USED TO TEST BLOOD SUGARS 3-4 TIMES DAILY *NEW PRESCRIPTION REQUEST* 400 each 11   albuterol  (VENTOLIN  HFA) 108 (90 Base) MCG/ACT inhaler Inhale 1-2 puffs into the lungs every 6 (six) hours as needed for wheezing or shortness of breath. 18 g 0   aspirin  EC 81 MG tablet Take 1 tablet (81 mg total) by mouth daily. Swallow whole.     atorvastatin  (LIPITOR) 40 MG tablet Take 1 tablet (40 mg total) by mouth daily. 60 tablet 3   Blood Glucose Monitoring Suppl (BLOOD GLUCOSE MONITOR SYSTEM) w/Device KIT Use 3 (three) times daily as directed 1 kit 0   cetirizine  (ALLERGY RELIEF CETIRIZINE ) 10 MG tablet TAKE 1 TABLET BY MOUTH EVERY DAY *NEW PRESCRIPTION REQUEST* 90 tablet 2   Continuous Glucose Sensor (DEXCOM G7 SENSOR) MISC 1 Device by Does not apply route as directed. 9 each 3   ferrous sulfate   325 (65 FE) MG tablet Take 1 tablet (325 mg total) by mouth daily with breakfast. Please take with a source of Vitamin C 90 tablet 3   fluticasone  (FLONASE ) 50 MCG/ACT nasal spray Place 1-2 sprays into both nostrils daily. 16 g 0   glucose blood (ACCU-CHEK GUIDE TEST) test strip USED TO TEST BLOOD SUGARS up to  three IMES DAILY 400 strip 11   ibuprofen  (ADVIL ) 200 MG tablet Take 200 mg by mouth every 6 (six) hours as needed.     insulin  aspart (NOVOLOG  FLEXPEN) 100 UNIT/ML FlexPen Inject 4 Units into the skin 3 (three) times daily with meals. If eating and Blood Glucose (BG) 80 or higher inject 0 units for meal coverage and add correction dose per scale. If not eating, correction dose only. BG <150= 0 unit; BG 150-200= 1 unit; BG 201-250= 2 unit; BG 251-300= 3 unit; BG 301-350= 4 unit; BG 351-400= 5 unit 30 mL 3   Insulin  Glargine (BASAGLAR  KWIKPEN) 100 UNIT/ML Inject 36 Units into the skin daily. 45 mL 3   Insulin  Pen Needle 31G X 8 MM MISC 1 Device by Does not apply route in the morning, at noon, in the evening, and at bedtime. 400 each 3   Lancet Device MISC Use 3 (three) times daily. May dispense any manufacturer covered by patient's insurance. 1 each 0   Lancets (ACCU-CHEK MULTICLIX) lancets USE TO CHECK BLOOD SUGARS UP TO 4 TIMES DAILY  DX CODE:E11.9 100 each 12   levonorgestrel  (MIRENA ) 20 MCG/24HR IUD 1 each by Intrauterine route once.     losartan  (COZAAR ) 100 MG tablet Take 1 tablet (100 mg total) by mouth daily. 90 tablet 3   metFORMIN  (GLUCOPHAGE -XR) 750 MG 24 hr tablet TAKE 2 TABLETS (1,500 MG TOTAL) BY MOUTH EVERY DAY WITH BREAKFAST *NEW PRESCRIPTION REQUEST* 180 tablet 2   methocarbamol  (ROBAXIN ) 500 MG tablet TAKE 1 TABLET BY MOUTH 3 TIMES DAILY  270 tablet 11   omeprazole (PRILOSEC) 20 MG capsule Take 20 mg by mouth daily.     ondansetron  (ZOFRAN ) 8 MG tablet Take 1 tablet (8 mg total) by mouth every 12 (twelve) hours as needed for nausea or vomiting. 60 tablet 0   propranolol   (INDERAL ) 10 MG tablet Take 1 tablet (10 mg total) by mouth 2 (two) times daily. 180 tablet 1   sertraline  (ZOLOFT ) 100 MG tablet TAKE 1 TABLETS BY MOUTH ONCE DAILY 90 tablet 1   Vitamin D , Ergocalciferol , (DRISDOL ) 1.25 MG (50000 UNIT) CAPS capsule Take 1 capsule (50,000 Units total) by mouth 2 (two) times a week. 24 capsule 1   No current facility-administered medications for this visit.    REVIEW OF SYSTEMS:   Constitutional: ( - ) fevers, ( - )  chills , ( - ) night sweats Eyes: ( - ) blurriness of vision, ( - ) double vision, ( - ) watery eyes Ears, nose, mouth, throat, and face: ( - ) mucositis, ( - ) sore throat Respiratory: ( - ) cough, ( - ) dyspnea, ( - ) wheezes Cardiovascular: ( - ) palpitation, ( - ) chest discomfort, ( - ) lower extremity swelling Gastrointestinal:  ( - ) nausea, ( - ) heartburn, ( - ) change in bowel habits Skin: ( - ) abnormal skin rashes Lymphatics: ( - ) new lymphadenopathy, ( - ) easy bruising Neurological: ( - ) numbness, ( - ) tingling, ( - ) new weaknesses Behavioral/Psych: ( - ) mood change, ( - ) new changes  All other systems were reviewed with the patient and are negative.  PHYSICAL EXAMINATION:  Vitals:   08/15/24 1204 08/15/24 1224  BP: (!) 147/100 (!) 133/90  Pulse: 96   Resp: 16   Temp: 98 F (36.7 C)   SpO2: 100%     Filed Weights   08/15/24 1204  Weight: (!) 305 lb 12.8 oz (138.7 kg)     GENERAL: Well-appearing middle-age African-American female, alert, no distress and comfortable SKIN: skin color, texture, turgor are normal, no rashes or significant lesions EYES: conjunctiva are pink and non-injected, sclera clear LUNGS: clear to auscultation and percussion with normal breathing effort HEART: regular rate & rhythm and no murmurs and no lower extremity edema Musculoskeletal: no cyanosis of digits and no clubbing  PSYCH: alert & oriented x 3, fluent speech NEURO: no focal motor/sensory deficits  LABORATORY DATA:  I have  reviewed the data as listed    Latest Ref Rng & Units 08/15/2024   11:32 AM 08/11/2024    1:20 PM 06/20/2024    8:52 AM  CBC  WBC 4.0 - 10.5 K/uL 8.4  8.7  7.3   Hemoglobin 12.0 - 15.0 g/dL 86.7  85.9  86.7   Hematocrit 36.0 - 46.0 % 40.3  44.3  42.9   Platelets 150 - 400 K/uL 449  482  520        Latest Ref Rng & Units 08/15/2024   11:32 AM 08/08/2024    3:32 PM 08/01/2024   10:15 AM  CMP  Glucose 70 - 99 mg/dL 785  796  822   BUN 6 - 20 mg/dL 13  15  14    Creatinine 0.44 - 1.00 mg/dL 9.17  8.93  9.13   Sodium 135 - 145 mmol/L 136  136  136   Potassium 3.5 - 5.1 mmol/L 4.0  4.6  4.7   Chloride 98 - 111 mmol/L 102  98  97  CO2 22 - 32 mmol/L 23  26  22    Calcium  8.9 - 10.3 mg/dL 88.0  88.4  87.4   Total Protein 6.5 - 8.1 g/dL 8.4     Total Bilirubin 0.0 - 1.2 mg/dL 1.5     Alkaline Phos 38 - 126 U/L 681     AST 15 - 41 U/L 68     ALT 0 - 44 U/L 42       No results found for: MPROTEIN No results found for: KPAFRELGTCHN, LAMBDASER, KAPLAMBRATIO   RADIOGRAPHIC STUDIES: US  BIOPSY (LIVER) Result Date: 08/11/2024 INDICATION: cirrhosis, r/o sarcoidosis EXAM: ULTRASOUND GUIDED LIVER BIOPSY, NON TARGETED COMPARISON:  CT AP, 03/30/2024. US  Abdomen, 06/06/2024. MR abdomen, 06/06/2024. MEDICATIONS: None ANESTHESIA/SEDATION: Moderate (conscious) sedation was employed during this procedure. A total of Versed  2 mg and Fentanyl  100 mcg was administered intravenously. Moderate Sedation Time: 21 minutes. The patient's level of consciousness and vital signs were monitored continuously by radiology nursing throughout the procedure under my direct supervision. COMPLICATIONS: None immediate. PROCEDURE: Informed written consent was obtained from the patient and/or patient's representative after a discussion of the risks, benefits and alternatives to treatment. The patient understands and consents the procedure. A timeout was performed prior to the initiation of the procedure. Ultrasound scanning  was performed of the right upper abdominal quadrant demonstrates echogenic liver parenchyma with mild contour irregularity. The LEFT hepatic lobe was selected for biopsy and the procedure was planned. The right upper abdominal quadrant was prepped and draped in the usual sterile fashion. The overlying soft tissues were anesthetized with 1% lidocaine . A 17 gauge co-axial needle was advanced into a peripheral aspect of the lesion. This was followed by 3 core biopsies with an 18 gauge core device under direct ultrasound guidance. The needle was removed, then superficial hemostasis was obtained with manual compression. The coaxial needle tract was embolized with Gel-Foam slurry, then superficial hemostasis was obtained with manual compression. Post procedural scanning was negative for definitive area of hemorrhage or additional complication. A dressing was placed. The patient tolerated the procedure well without immediate post procedural complication. IMPRESSION: Successful ultrasound-guided core needle biopsy of liver. Thom Hall, MD [facility] Radiology Electronically Signed   By: Thom Hall M.D.   On: 08/11/2024 17:31    ASSESSMENT & PLAN Ataya Murdy 44 y.o. female with medical history significant for iron  deficiency anemia secondary menstrual bleeding who presents for a follow up visit.   # Iron  Deficiency Anemia 2/2 to GYN Bleeding -- Findings are consistent with iron  deficiency anemia secondary to patient's menorrhagia --Encouraged her to follow-up with OB/GYN for better control of her menstrual cycles --labs today show white blood cell 8.4, Hgb 13.2, MCV 81.4, Plt 449. Iron  studies pending. -- Patient has stopped ferrous sulfate  325 mg daily with a source of vitamin C --We will plan to proceed with IV iron  therapy in order to help bolster the patient's blood counts if her iron  levels are persistently low. 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 -- Will discuss with gastroenterology to see if they agree with imaging of the chest to determine if there is also pulmonary sarcoid. -- The sarcoidosis is the most likely cause of the patient's hypercalcemia, but will order kappa lambda light chains to rule out a monoclonal gammopathy -- Patient previously had  negative IFE and SPEP.  #Liver Lesions -- repeat imaging showed no evidence of liver lesions  --repeat MRI on 06/06/2024 showed cirrhotic liver with no focal lesions.  --Liver Biopsy showed findings concerning for sarcoidosis. -- Continue following with GI and liver team.    Orders Placed This Encounter  Procedures   Kappa/lambda light chains    Standing Status:   Future    Number of Occurrences:   1    Expiration Date:   08/15/2025    All questions were answered. The patient knows to call the clinic with any problems, questions or concerns.  A total of more than 30 minutes were spent on this encounter with face-to-face time and non-face-to-face time, including preparing to see the patient, ordering tests and/or medications, counseling the patient and coordination of care as outlined above.   Norleen IVAR Kidney, MD Department of Hematology/Oncology [facility] at [facility] Phone: [phone] Pager: [phone] Email: [email or web address]  08/16/2024 11:13 AM  "

## 2024-08-15 ENCOUNTER — Inpatient Hospital Stay (HOSPITAL_BASED_OUTPATIENT_CLINIC_OR_DEPARTMENT_OTHER): Admitting: Hematology and Oncology

## 2024-08-15 ENCOUNTER — Other Ambulatory Visit: Payer: Self-pay | Admitting: *Deleted

## 2024-08-15 ENCOUNTER — Inpatient Hospital Stay: Attending: Physician Assistant

## 2024-08-15 VITALS — BP 133/90 | HR 96 | Temp 98.0°F | Resp 16 | Wt 305.8 lb

## 2024-08-15 DIAGNOSIS — K7469 Other cirrhosis of liver: Secondary | ICD-10-CM | POA: Diagnosis not present

## 2024-08-15 DIAGNOSIS — N920 Excessive and frequent menstruation with regular cycle: Secondary | ICD-10-CM | POA: Insufficient documentation

## 2024-08-15 DIAGNOSIS — K769 Liver disease, unspecified: Secondary | ICD-10-CM | POA: Insufficient documentation

## 2024-08-15 DIAGNOSIS — D508 Other iron deficiency anemias: Secondary | ICD-10-CM

## 2024-08-15 DIAGNOSIS — D8689 Sarcoidosis of other sites: Secondary | ICD-10-CM | POA: Insufficient documentation

## 2024-08-15 DIAGNOSIS — Z8052 Family history of malignant neoplasm of bladder: Secondary | ICD-10-CM | POA: Insufficient documentation

## 2024-08-15 DIAGNOSIS — Z808 Family history of malignant neoplasm of other organs or systems: Secondary | ICD-10-CM | POA: Insufficient documentation

## 2024-08-15 DIAGNOSIS — Z79899 Other long term (current) drug therapy: Secondary | ICD-10-CM | POA: Insufficient documentation

## 2024-08-15 DIAGNOSIS — D500 Iron deficiency anemia secondary to blood loss (chronic): Secondary | ICD-10-CM | POA: Insufficient documentation

## 2024-08-15 DIAGNOSIS — Z87891 Personal history of nicotine dependence: Secondary | ICD-10-CM | POA: Insufficient documentation

## 2024-08-15 LAB — CBC WITH DIFFERENTIAL (CANCER CENTER ONLY)
Abs Immature Granulocytes: 0.09 K/uL — ABNORMAL HIGH (ref 0.00–0.07)
Basophils Absolute: 0.1 K/uL (ref 0.0–0.1)
Basophils Relative: 1 %
Eosinophils Absolute: 0.6 K/uL — ABNORMAL HIGH (ref 0.0–0.5)
Eosinophils Relative: 7 %
HCT: 40.3 % (ref 36.0–46.0)
Hemoglobin: 13.2 g/dL (ref 12.0–15.0)
Immature Granulocytes: 1 %
Lymphocytes Relative: 9 %
Lymphs Abs: 0.8 K/uL (ref 0.7–4.0)
MCH: 26.7 pg (ref 26.0–34.0)
MCHC: 32.8 g/dL (ref 30.0–36.0)
MCV: 81.4 fL (ref 80.0–100.0)
Monocytes Absolute: 1.1 K/uL — ABNORMAL HIGH (ref 0.1–1.0)
Monocytes Relative: 14 %
Neutro Abs: 5.7 K/uL (ref 1.7–7.7)
Neutrophils Relative %: 68 %
Platelet Count: 449 K/uL — ABNORMAL HIGH (ref 150–400)
RBC: 4.95 MIL/uL (ref 3.87–5.11)
RDW: 19.7 % — ABNORMAL HIGH (ref 11.5–15.5)
WBC Count: 8.4 K/uL (ref 4.0–10.5)
nRBC: 0.2 % (ref 0.0–0.2)

## 2024-08-15 LAB — IRON AND IRON BINDING CAPACITY (CC-WL,HP ONLY)
Iron: 41 ug/dL (ref 28–170)
Saturation Ratios: 11 % (ref 10.4–31.8)
TIBC: 372 ug/dL (ref 250–450)
UIBC: 331 ug/dL

## 2024-08-15 LAB — CMP (CANCER CENTER ONLY)
ALT: 42 U/L (ref 0–44)
AST: 68 U/L — ABNORMAL HIGH (ref 15–41)
Albumin: 3.1 g/dL — ABNORMAL LOW (ref 3.5–5.0)
Alkaline Phosphatase: 681 U/L — ABNORMAL HIGH (ref 38–126)
Anion gap: 11 (ref 5–15)
BUN: 13 mg/dL (ref 6–20)
CO2: 23 mmol/L (ref 22–32)
Calcium: 11.9 mg/dL — ABNORMAL HIGH (ref 8.9–10.3)
Chloride: 102 mmol/L (ref 98–111)
Creatinine: 0.82 mg/dL (ref 0.44–1.00)
GFR, Estimated: >60 mL/min
Glucose, Bld: 214 mg/dL — ABNORMAL HIGH (ref 70–99)
Potassium: 4 mmol/L (ref 3.5–5.1)
Sodium: 136 mmol/L (ref 135–145)
Total Bilirubin: 1.5 mg/dL — ABNORMAL HIGH (ref 0.0–1.2)
Total Protein: 8.4 g/dL — ABNORMAL HIGH (ref 6.5–8.1)

## 2024-08-15 LAB — RETIC PANEL
Immature Retic Fract: 24 % — ABNORMAL HIGH (ref 2.3–15.9)
RBC.: 4.94 MIL/uL (ref 3.87–5.11)
Retic Count, Absolute: 90.4 K/uL (ref 19.0–186.0)
Retic Ct Pct: 1.8 % (ref 0.4–3.1)
Reticulocyte Hemoglobin: 25.5 pg — ABNORMAL LOW

## 2024-08-15 LAB — FERRITIN: Ferritin: 743 ng/mL — ABNORMAL HIGH (ref 11–307)

## 2024-08-15 MED ORDER — ONDANSETRON HCL 8 MG PO TABS: 8.0000 mg | ORAL_TABLET | Freq: Two times a day (BID) | ORAL | 0 refills | Status: AC | PRN

## 2024-08-16 ENCOUNTER — Other Ambulatory Visit: Payer: Self-pay

## 2024-08-16 ENCOUNTER — Ambulatory Visit: Payer: Self-pay | Admitting: Gastroenterology

## 2024-08-16 ENCOUNTER — Inpatient Hospital Stay: Admitting: Physician Assistant

## 2024-08-16 ENCOUNTER — Inpatient Hospital Stay

## 2024-08-16 DIAGNOSIS — D869 Sarcoidosis, unspecified: Secondary | ICD-10-CM

## 2024-08-16 LAB — KAPPA/LAMBDA LIGHT CHAINS
Kappa free light chain: 80.6 mg/L — ABNORMAL HIGH (ref 3.3–19.4)
Kappa, lambda light chain ratio: 1.02 (ref 0.26–1.65)
Lambda free light chains: 79.4 mg/L — ABNORMAL HIGH (ref 5.7–26.3)

## 2024-08-16 NOTE — Progress Notes (Signed)
 Ms. Eyerly,  Your liver biopsy showed nonnecrotizing granulomas.  These findings are very suggestive of a condition called sarcoidosis.  Sarcoidosis can also cause high calcium  levels, which you have.  Sarcoidosis is an autoimmune disorder that can affect numerous body organs.  The lungs and skin are often involved.  Sarcoidosis is often managed by pulmonologist or rheumatologist.  When the liver is involved, as in your case, it is best managed by a liver specialist (hepatologist).  I have very little experience in managing sarcoidosis involving the liver.  Therefore, I would like to refer you to a liver specialist in [place].  They are affiliated with [facility].  The good news is that there was no significant scarring noted on your liver biopsy.  I feel very confident you do not have cirrhosis based on this result.  I will also get imaging of your chest.  I believe this was discussed with Dr. Federico.  We will go ahead and order this CT scan.  This will look for any evidence of sarcoidosis involving the lungs or mediastinum.  If there are abnormalities noted on the CT scan, then we will also refer you to a lung specialist.  Team,  Can you please order a CT of the chest with contrast and place referral to [facility] for management of newly diagnosed sarcoidosis

## 2024-08-17 NOTE — Telephone Encounter (Signed)
 Patient has an appointment with [facility] on 09-12-24 at 9:15am. (Note: patient was already referred by [name] on 05-17-24)

## 2024-08-18 ENCOUNTER — Ambulatory Visit (HOSPITAL_COMMUNITY): Payer: Self-pay

## 2024-08-23 ENCOUNTER — Ambulatory Visit (HOSPITAL_COMMUNITY)

## 2024-08-26 ENCOUNTER — Other Ambulatory Visit: Payer: Self-pay

## 2024-09-01 ENCOUNTER — Encounter: Payer: Self-pay | Admitting: Internal Medicine

## 2024-09-01 ENCOUNTER — Encounter: Payer: Self-pay | Admitting: Nurse Practitioner

## 2024-09-01 NOTE — Patient Outreach (Signed)
 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 I obtained verbal consent from Patient.  Visit completed with Patient on the phone.  Background:   Past Medical History:  Diagnosis Date   Abnormal Pap smear    f/u wnl   Asthma    DM2 (diabetes mellitus, type 2) (HCC)    DM2 (diabetes mellitus, type 2) (HCC) 09/13/2012   AIC 9.7 09-24-12 Referred to Dr. Jarold after consult with Dr. Darcel Appt. Scheduled  C. Armer, CNM, FNP    Dyspnea    Fibroid    Gestational diabetes    Hypertension    Liver disease    Morbid obesity (HCC)    Sleep apnea    not on cpap, diagnosed when pregnant   Urinary tract infection     Assessment: Patient Reported Symptoms:  Cognitive Cognitive Status: Alert and oriented to person, place, and time, Normal speech and language skills Cognitive/Intellectual Conditions Management [RPT]: None reported or documented in medical history or problem list      Neurological Neurological Review of Symptoms: Not assessed    HEENT HEENT Symptoms Reported: Not assessed      Cardiovascular Cardiovascular Symptoms Reported: Palpitations, Swelling in legs or feet Does patient have uncontrolled Hypertension?: Yes Is patient checking Blood Pressure at home?: Yes Cardiovascular Management Strategies: Routine screening, Medication therapy, Diet modification, Adequate rest Cardiovascular Self-Management Outcome: 4 (good)  Respiratory Respiratory Symptoms  Reported: Shortness of breath Respiratory Management Strategies: Routine screening, Adequate rest, Breathing techniques Respiratory Self-Management Outcome: 4 (good)  Endocrine Endocrine Symptoms Reported: No symptoms reported Is patient diabetic?: Yes Is patient checking blood sugars at home?: Yes Endocrine Self-Management Outcome: 4 (good)  Gastrointestinal Gastrointestinal Symptoms Reported: Distention Gastrointestinal Management Strategies: Adequate rest, Medication therapy Gastrointestinal Self-Management Outcome: 4 (good)    Genitourinary Genitourinary Symptoms Reported: No symptoms reported    Integumentary Integumentary Symptoms Reported: No symptoms reported    Musculoskeletal Musculoskelatal Symptoms Reviewed: Not assessed        Psychosocial Psychosocial Symptoms Reported: No symptoms reported   Major Change/Loss/Stressor/Fears (CP): Medical condition, self Techniques to Cope with Loss/Stress/Change: Diversional activities (support system) Quality of Family Relationships: helpful, involved, supportive Do you feel physically threatened by others?: No    09/01/2024    PHQ2-9 Depression Screening   [name] interest or pleasure in doing things    Feeling down, depressed, or hopeless    PHQ-2 - Total Score    Trouble falling or staying asleep, or sleeping too much    Feeling tired or having [name] energy    Poor appetite or overeating     Feeling bad about yourself - or that you are a failure or have let yourself or your family down    Trouble concentrating on things, such as reading the newspaper or watching television    Moving or speaking so slowly that other people could have noticed.  Or the opposite - being so fidgety or restless that you have been moving around a lot more than usual    Thoughts that you would be better off dead,  or hurting yourself in some way    PHQ2-9 Total Score    If you checked off any problems, how difficult have these problems made it for you  to do your work, take care of things at home, or get along with other people    Depression Interventions/Treatment      There were no vitals filed for this visit. Pain Scale: Not given for pain  Medications Reviewed Today     Reviewed by Morgan Clayborne CROME, RN (Registered Nurse) on 09/01/24 at 1640  Med List Status: <None>   Medication Order Taking? Sig Documenting Provider Last Dose Status Informant  Accu-Chek Softclix Lancets lancets [id number]  USED TO TEST BLOOD SUGARS 3-4 TIMES DAILY *NEW PRESCRIPTION REQUESTDEWAINE Ada, Gaines, FNP  Active   albuterol  (VENTOLIN  HFA) 108 (90 Base) MCG/ACT inhaler [id number] No Inhale 1-2 puffs into the lungs every 6 (six) hours as needed for wheezing or shortness of breath. Wieters, Hallie C, [name] 08/11/2024 11:00 AM Active Self, Pharmacy Records  aspirin  EC 81 MG tablet [id number]  Take 1 tablet (81 mg total) by mouth daily. Swallow whole. Thukkani, Arun K, MD  Active            Med Note (RUNYAN, BASCOM LELON Schaumann Aug 11, 2024 12:10 PM) Pt states has not began   atorvastatin  (LIPITOR) 40 MG tablet [id number]  Take 1 tablet (40 mg total) by mouth daily. Thukkani, Arun K, MD  Active   Blood Glucose Monitoring Suppl (BLOOD GLUCOSE MONITOR SYSTEM) w/Device KIT [id number]  Use 3 (three) times daily as directed Samtani, Jai-Gurmukh, MD  Active   cetirizine  (ALLERGY RELIEF CETIRIZINE ) 10 MG tablet [id number] No TAKE 1 TABLET BY MOUTH EVERY DAY *NEW PRESCRIPTION REQUESTDEWAINE [name], FNP 08/10/2024 Active   Continuous Glucose Sensor (DEXCOM G7 SENSOR) MISC [id number]  1 Device by Does not apply route as directed. Shamleffer, Donell Cardinal, MD  Active   ferrous sulfate  325 (65 FE) MG tablet [id number] No Take 1 tablet (325 mg total) by mouth daily with breakfast. Please take with a source of Vitamin C Dorsey, John T IV, MD More than a month Active   fluticasone  (FLONASE ) 50 MCG/ACT nasal spray [id number] No Place 1-2 sprays into both nostrils daily. Pura Brittle, NP 08/10/2024  Active Self, Pharmacy Records  glucose blood (ACCU-CHEK GUIDE TEST) test strip [id number]  USED TO TEST BLOOD SUGARS up to  three IMES DAILY Shamleffer, Ibtehal Jaralla, MD  Active   ibuprofen  (ADVIL ) 200 MG tablet [id number] No Take 200 mg by mouth every 6 (six) hours as needed. [provider] 08/10/2024 Active   insulin  aspart (NOVOLOG  FLEXPEN) 100 UNIT/ML FlexPen [id number] No Inject 4 Units into the skin 3 (three) times daily with meals. If eating and Blood Glucose (BG) 80 or higher inject 0 units for meal coverage and add correction dose per scale. If not eating, correction dose only. BG <150= 0 unit; BG 150-200= 1 unit; BG 201-250= 2 unit; BG 251-300= 3 unit; BG 301-350= 4 unit; BG 351-400= 5 unit Shamleffer, Donell Cardinal, MD 08/10/2024 Active   Insulin  Glargine (BASAGLAR  KWIKPEN) 100 UNIT/ML [id number] No Inject 36 Units into the skin daily. Shamleffer, Donell Cardinal, MD 08/10/2024 Active   Insulin  Pen Needle 31G X 8 MM MISC [id number]  1 Device by Does not apply route in the morning, at noon, in the evening, and at bedtime. Shamleffer, Donell Cardinal, MD  Active   Lancet Device MISC [id number]  Use 3 (three) times daily. May dispense any  manufacturer covered by [employer]. Samtani, Jai-Gurmukh, MD  Active   Lancets (ACCU-CHEK MULTICLIX) lancets [id number]  USE TO CHECK BLOOD SUGARS UP TO 4 TIMES DAILY  DX CODE:E11.9 Shamleffer, Donell Cardinal, MD  Active Self, Pharmacy Records  levonorgestrel  (MIRENA ) 20 MCG/24HR IUD [id number]  1 each by Intrauterine route once. [provider]  Active Self, Pharmacy Records           Med Note (CRUTHIS, CHLOE C   Mon Mar 14, 2024  8:21 AM) Still has this inserted.   losartan  (COZAAR ) 100 MG tablet [id number] No Take 1 tablet (100 mg total) by mouth daily. Thukkani, Arun K, MD 08/11/2024  8:00 AM Active   metFORMIN  (GLUCOPHAGE -XR) 750 MG 24 hr tablet [id number] No TAKE 2 TABLETS (1,500 MG TOTAL) BY MOUTH EVERY DAY WITH BREAKFAST *NEW  PRESCRIPTION REQUESTDEWAINE Georgina Speaks, FNP 08/10/2024 Active   methocarbamol  (ROBAXIN ) 500 MG tablet [id number] No TAKE 1 TABLET BY MOUTH 3 TIMES DAILY Georgina Speaks, FNP 08/10/2024 Active   omeprazole (PRILOSEC) 20 MG capsule [id number] No Take 20 mg by mouth daily. [provider] 08/10/2024 Active Self, Pharmacy Records  ondansetron  (ZOFRAN ) 8 MG tablet [id number]  Take 1 tablet (8 mg total) by mouth every 12 (twelve) hours as needed for nausea or vomiting. Federico Norleen ONEIDA MADISON, MD  Active   propranolol  (INDERAL ) 10 MG tablet [id number] No Take 1 tablet (10 mg total) by mouth 2 (two) times daily. Georgina Speaks, FNP 08/10/2024 Active   sertraline  (ZOLOFT ) 100 MG tablet [id number] No TAKE 1 TABLETS BY MOUTH ONCE DAILY Georgina Speaks, FNP 08/10/2024 Active   Vitamin D , Ergocalciferol , (DRISDOL ) 1.25 MG (50000 UNIT) CAPS capsule [id number] No Take 1 capsule (50,000 Units total) by mouth 2 (two) times a week. Georgina Speaks, FNP 08/10/2024 Active            Recommendation:   Specialty provider follow-up   08/10/2024 Status: Sch    Time: 3:30 PM Length: 45  Visit Type: TREATMENT-ORTHO [161] Copay: $0.00  Provider: Rosann Washington Greener, PT Department: [facility]     08/11/2024 Status: Sch    Time: 11:00 AM Length: 90  Visit Type: PRE PROCEDURE 90 [537] Copay: $0.00  Provider: [facility] ROOM Department: [facility]    08/11/2024 Status: Sch    Time: 1:00 PM Length: 60  Visit Type: US  [facility] [[id number]] Copay: $0.00  Provider: [facility]  2 Department: [facility]     08/23/2024 Status: Sch    Time: 9:15 AM Length: 60  Visit Type: ECHOCARDIOGRAM [881891] Copay: $0.00  Provider: [facility] Department: [facility]    Follow Up Plan:   Telephone follow up appointment date/time  08/26/2024 Status: Sch    Time: 3:00 PM Length: 30  Visit Type: VBCI TELEPHONE CALL 30 [2502] Copay: $0.00  Provider: Morgan Clayborne CROME, RN Department: [facility]   Clayborne Morgan RN BSN  CCM [place]  [facility], [facility] Nurse Care Coordinator  Direct Dial : [phone] Website: [email or web address]

## 2024-09-02 MED ORDER — BASAGLAR KWIKPEN 100 UNIT/ML ~~LOC~~ SOPN: 36.0000 [IU] | PEN_INJECTOR | Freq: Every day | SUBCUTANEOUS | 3 refills | Status: AC

## 2024-09-02 MED ORDER — NOVOLOG FLEXPEN 100 UNIT/ML ~~LOC~~ SOPN: 4.0000 [IU] | PEN_INJECTOR | Freq: Three times a day (TID) | SUBCUTANEOUS | 3 refills | Status: DC

## 2024-09-05 ENCOUNTER — Encounter: Payer: Self-pay | Admitting: Nurse Practitioner

## 2024-09-05 ENCOUNTER — Other Ambulatory Visit: Payer: Self-pay | Admitting: Internal Medicine

## 2024-09-05 ENCOUNTER — Telehealth: Payer: Self-pay | Admitting: Nurse Practitioner

## 2024-09-05 DIAGNOSIS — E782 Mixed hyperlipidemia: Secondary | ICD-10-CM

## 2024-09-05 DIAGNOSIS — F331 Major depressive disorder, recurrent, moderate: Secondary | ICD-10-CM

## 2024-09-05 DIAGNOSIS — R000 Tachycardia, unspecified: Secondary | ICD-10-CM

## 2024-09-05 DIAGNOSIS — E559 Vitamin D deficiency, unspecified: Secondary | ICD-10-CM

## 2024-09-05 DIAGNOSIS — E119 Type 2 diabetes mellitus without complications: Secondary | ICD-10-CM

## 2024-09-05 DIAGNOSIS — F418 Other specified anxiety disorders: Secondary | ICD-10-CM

## 2024-09-05 DIAGNOSIS — J302 Other seasonal allergic rhinitis: Secondary | ICD-10-CM | POA: Insufficient documentation

## 2024-09-05 DIAGNOSIS — Z111 Encounter for screening for respiratory tuberculosis: Secondary | ICD-10-CM

## 2024-09-05 DIAGNOSIS — I10 Essential (primary) hypertension: Secondary | ICD-10-CM

## 2024-09-05 DIAGNOSIS — D869 Sarcoidosis, unspecified: Secondary | ICD-10-CM | POA: Insufficient documentation

## 2024-09-05 MED ORDER — FLUTICASONE PROPIONATE 50 MCG/ACT NA SUSP: 1.0000 | Freq: Every day | NASAL | 0 refills | Status: AC

## 2024-09-05 MED ORDER — SERTRALINE HCL 100 MG PO TABS: ORAL_TABLET | ORAL | 1 refills | Status: AC

## 2024-09-05 MED ORDER — PROPRANOLOL HCL 10 MG PO TABS: 10.0000 mg | ORAL_TABLET | Freq: Two times a day (BID) | ORAL | 1 refills | Status: AC

## 2024-09-05 MED ORDER — CETIRIZINE HCL 10 MG PO TABS: 10.0000 mg | ORAL_TABLET | Freq: Every day | ORAL | 2 refills | Status: AC

## 2024-09-05 NOTE — Assessment & Plan Note (Signed)
 Sertraline  part of treatment plan. Follow-up needed with Christina ([facility]) - Scheduled follow-up with mental health provider. - continue with sertraline  and propranolol 

## 2024-09-05 NOTE — Assessment & Plan Note (Signed)
 New diagnosis confirmed by liver biopsy. Awaiting further imaging to assess organ involvement. - Continue follow-up with liver specialist. - Await CT scan results.

## 2024-09-05 NOTE — Assessment & Plan Note (Signed)
 Will check vitamin D  level and supplement as needed.    Also encouraged to spend 15 minutes in the sun daily.

## 2024-09-05 NOTE — Assessment & Plan Note (Signed)
 Symptoms managed with fluticasone  and cetirizine . - Refilled fluticasone  and cetirizine  prescriptions.

## 2024-09-05 NOTE — Assessment & Plan Note (Signed)
Needs for work.

## 2024-09-05 NOTE — Assessment & Plan Note (Signed)
 Blood pressure managed with losartan  and propranolol .

## 2024-09-12 ENCOUNTER — Encounter: Admitting: Family Medicine

## 2024-09-15 ENCOUNTER — Ambulatory Visit (HOSPITAL_COMMUNITY): Payer: Self-pay

## 2024-09-19 ENCOUNTER — Telehealth

## 2024-09-20 ENCOUNTER — Inpatient Hospital Stay: Attending: Physician Assistant

## 2024-10-17 ENCOUNTER — Encounter: Payer: Self-pay | Admitting: Family Medicine

## 2024-10-25 ENCOUNTER — Ambulatory Visit: Admitting: Internal Medicine

## 2024-12-20 ENCOUNTER — Inpatient Hospital Stay

## 2024-12-20 ENCOUNTER — Inpatient Hospital Stay: Admitting: Physician Assistant

## 2025-02-13 ENCOUNTER — Inpatient Hospital Stay

## 2025-02-13 ENCOUNTER — Inpatient Hospital Stay: Admitting: Hematology and Oncology

## 2025-05-08 ENCOUNTER — Encounter: Payer: Self-pay | Admitting: Nurse Practitioner
